# Patient Record
Sex: Female | Born: 1945 | Race: Black or African American | Hispanic: No | Marital: Married | Smoking: Former smoker
Health system: Southern US, Community
[De-identification: ages and names within clinical notes are randomized; demographics above are authoritative.]

## PROBLEM LIST (undated history)

## (undated) DIAGNOSIS — R51 Headache: Secondary | ICD-10-CM

## (undated) DIAGNOSIS — J42 Unspecified chronic bronchitis: Secondary | ICD-10-CM

## (undated) DIAGNOSIS — K219 Gastro-esophageal reflux disease without esophagitis: Secondary | ICD-10-CM

## (undated) DIAGNOSIS — J449 Chronic obstructive pulmonary disease, unspecified: Secondary | ICD-10-CM

## (undated) DIAGNOSIS — Z9981 Dependence on supplemental oxygen: Secondary | ICD-10-CM

## (undated) DIAGNOSIS — R0600 Dyspnea, unspecified: Secondary | ICD-10-CM

## (undated) DIAGNOSIS — C3490 Malignant neoplasm of unspecified part of unspecified bronchus or lung: Secondary | ICD-10-CM

## (undated) DIAGNOSIS — G4733 Obstructive sleep apnea (adult) (pediatric): Secondary | ICD-10-CM

## (undated) DIAGNOSIS — F419 Anxiety disorder, unspecified: Secondary | ICD-10-CM

## (undated) DIAGNOSIS — Z923 Personal history of irradiation: Secondary | ICD-10-CM

## (undated) DIAGNOSIS — M1990 Unspecified osteoarthritis, unspecified site: Secondary | ICD-10-CM

## (undated) DIAGNOSIS — F32A Depression, unspecified: Secondary | ICD-10-CM

## (undated) DIAGNOSIS — R519 Headache, unspecified: Secondary | ICD-10-CM

## (undated) DIAGNOSIS — Z9289 Personal history of other medical treatment: Secondary | ICD-10-CM

## (undated) DIAGNOSIS — I2510 Atherosclerotic heart disease of native coronary artery without angina pectoris: Secondary | ICD-10-CM

## (undated) DIAGNOSIS — J189 Pneumonia, unspecified organism: Secondary | ICD-10-CM

## (undated) DIAGNOSIS — I10 Essential (primary) hypertension: Secondary | ICD-10-CM

## (undated) DIAGNOSIS — F329 Major depressive disorder, single episode, unspecified: Secondary | ICD-10-CM

## (undated) DIAGNOSIS — Z9989 Dependence on other enabling machines and devices: Secondary | ICD-10-CM

## (undated) DIAGNOSIS — I509 Heart failure, unspecified: Secondary | ICD-10-CM

## (undated) DIAGNOSIS — I272 Pulmonary hypertension, unspecified: Secondary | ICD-10-CM

## (undated) DIAGNOSIS — C3492 Malignant neoplasm of unspecified part of left bronchus or lung: Secondary | ICD-10-CM

## (undated) HISTORY — PX: TUBAL LIGATION: SHX77

## (undated) HISTORY — PX: SINUSOTOMY: SHX291

## (undated) HISTORY — PX: DILATION AND CURETTAGE OF UTERUS: SHX78

## (undated) HISTORY — PX: LAPAROSCOPIC CHOLECYSTECTOMY: SUR755

## (undated) HISTORY — DX: Personal history of irradiation: Z92.3

## (undated) HISTORY — DX: Malignant neoplasm of unspecified part of left bronchus or lung: C34.92

## (undated) HISTORY — DX: Chronic obstructive pulmonary disease, unspecified: J44.9

## (undated) HISTORY — PX: TONSILLECTOMY: SUR1361

## (undated) HISTORY — PX: COLONOSCOPY: SHX174

---

## 1998-03-21 ENCOUNTER — Encounter: Payer: Self-pay | Admitting: Surgery

## 1998-03-25 ENCOUNTER — Ambulatory Visit (HOSPITAL_COMMUNITY): Admission: RE | Admit: 1998-03-25 | Discharge: 1998-03-26 | Payer: Self-pay | Admitting: Surgery

## 2002-03-29 ENCOUNTER — Encounter: Admission: RE | Admit: 2002-03-29 | Discharge: 2002-03-29 | Payer: Self-pay | Admitting: Cardiology

## 2002-03-29 ENCOUNTER — Encounter: Payer: Self-pay | Admitting: Cardiology

## 2003-01-22 ENCOUNTER — Encounter: Admission: RE | Admit: 2003-01-22 | Discharge: 2003-01-22 | Payer: Self-pay | Admitting: Cardiology

## 2005-03-17 ENCOUNTER — Encounter: Admission: RE | Admit: 2005-03-17 | Discharge: 2005-03-17 | Payer: Self-pay | Admitting: Cardiology

## 2005-03-27 ENCOUNTER — Emergency Department (HOSPITAL_COMMUNITY): Admission: EM | Admit: 2005-03-27 | Discharge: 2005-03-28 | Payer: Self-pay | Admitting: Emergency Medicine

## 2009-06-04 ENCOUNTER — Ambulatory Visit: Payer: Self-pay | Admitting: Internal Medicine

## 2009-06-04 DIAGNOSIS — J328 Other chronic sinusitis: Secondary | ICD-10-CM

## 2009-06-04 LAB — CONVERTED CEMR LAB
Basophils Absolute: 0 10*3/uL (ref 0.0–0.1)
Basophils Relative: 0.5 % (ref 0.0–3.0)
Eosinophils Absolute: 0.1 10*3/uL (ref 0.0–0.7)
Eosinophils Relative: 1.6 % (ref 0.0–5.0)
HCT: 43.9 % (ref 36.0–46.0)
Hemoglobin: 14.8 g/dL (ref 12.0–15.0)
Lymphocytes Relative: 32.4 % (ref 12.0–46.0)
Lymphs Abs: 2 10*3/uL (ref 0.7–4.0)
MCHC: 33.8 g/dL (ref 30.0–36.0)
MCV: 94.1 fL (ref 78.0–100.0)
Monocytes Absolute: 0.4 10*3/uL (ref 0.1–1.0)
Monocytes Relative: 6.1 % (ref 3.0–12.0)
Neutro Abs: 3.6 10*3/uL (ref 1.4–7.7)
Neutrophils Relative %: 59.4 % (ref 43.0–77.0)
Platelets: 183 10*3/uL (ref 150.0–400.0)
RBC: 4.67 M/uL (ref 3.87–5.11)
RDW: 15 % — ABNORMAL HIGH (ref 11.5–14.6)
WBC: 6.1 10*3/uL (ref 4.5–10.5)

## 2009-06-07 DIAGNOSIS — F17200 Nicotine dependence, unspecified, uncomplicated: Secondary | ICD-10-CM | POA: Insufficient documentation

## 2009-06-07 DIAGNOSIS — I10 Essential (primary) hypertension: Secondary | ICD-10-CM | POA: Insufficient documentation

## 2009-06-09 ENCOUNTER — Telehealth (INDEPENDENT_AMBULATORY_CARE_PROVIDER_SITE_OTHER): Payer: Self-pay | Admitting: *Deleted

## 2009-06-12 ENCOUNTER — Encounter: Payer: Self-pay | Admitting: Internal Medicine

## 2009-07-30 ENCOUNTER — Ambulatory Visit: Payer: Self-pay | Admitting: Internal Medicine

## 2009-07-30 DIAGNOSIS — J42 Unspecified chronic bronchitis: Secondary | ICD-10-CM

## 2010-02-24 NOTE — Assessment & Plan Note (Signed)
Summary: allergy skin testing ///kp   Vital Signs:  Patient profile:   65 year old female Weight:      154 pounds O2 Sat:      90 % on Room air Pulse rate:   93 / minute BP sitting:   160 / 72  (left arm) Cuff size:   regular  Vitals Entered By: Reynaldo Minium CMA (July 30, 2009 3:28 PM)  O2 Flow:  Room air CC: Allergy testing   Primary Provider/Referring Provider:  Donia Guiles  CC:  Allergy testing.  History of Present Illness: History of Present Illness: Jun 04, 2009- 63 yoF smoker referred courtesy of Dr Shana Chute, complaining of chronic sinus disease.  since around age 5 hse has noted frontla pressure headacxhes, wateriung eyes, nasal congestion. 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 Has had pattern of sinus infections 3 x/ year with many antibiotics taken. Uses saline squeeze bottle.  In [place] this spring her eyes burned, ears and nose itched.  Had CT sinus by ENT several years ago. Has had tonsils out. Two years ago sinus surgery helped for about 6 months only. Last antibiotic was 5 months ago. doesn't remember treatment with steroids. Admits cough and wheeze only with colds. Strong perfume and irritant odors cause some nasal congestion.  July 30, 2009- Chronic rhinosinusitis Off antihistamines for skin testing, she reports that for past few days ears itch, eyes water, nasal/ head congestion. Not chest tight or wheeze currently.  Discussed her smoking again with encouragement to quit. She understands it can't be helping her airway complaints. Veramyst didn't help enough. IgE 22.2  CBC- OK; EOS 1.6% Allergy profile- neg for specific IgE elevations Skin test- Pos grass, weed, tree, dust   Preventive Screening-Counseling & Management  Alcohol-Tobacco     Smoking Status: current     Smoking Cessation Counseling: yes     Packs/Day: 1.0     Tobacco Counseling: to quit use of tobacco  products  Current Medications (verified): 1)  Amlodipine Besy-Benazepril Hcl 5-20 Mg Caps (Amlodipine Besy-Benazepril Hcl) .... Take 1 By Mouth Once Daily 2)  Hydrochlorothiazide 12.5 Mg Caps (Hydrochlorothiazide) .... Take 1 By Mouth Once Daily 3)  Allegra 180 Mg Tabs (Fexofenadine Hcl) .... Take 1 By Mouth Once Daily 4)  Mucinex 600 Mg Xr12h-Tab (Guaifenesin) .... Take As Directed As Needed On Box 5)  Sinus Rinse Kit  Pack (Hypertonic Nasal Wash) .... As Needed 6)  Opcon-A 0.027-0.315 % Soln (Naphazoline-Pheniramine) .... Three Times A Day 7)  Singulair 10 Mg Tabs (Montelukast Sodium) .Marland Kitchen.. 1 Daily 8)  Aleve 220 Mg Tabs (Naproxen Sodium) .... As Needed Per Bottle  Allergies (verified): No Known Drug Allergies  Past History:  Past Surgical History: Last updated: 06/04/2009 Tonsillectomy Sinus surgery  Family History: Last updated: 06/04/2009 Family Hx of Allergies/ sinus disease, and Heart Disease.  Social History: Last updated: 06/04/2009 married with children Smoker-1ppd Retired Engineer, site  Risk Factors: Smoking Status: current (07/30/2009) Packs/Day: 1.0 (07/30/2009)  Past Medical History: Chronic rhinosiusitis- Skin test 7/ 6/11 Tobacco use Hypertension Hx CHF  Social History: Smoking Status:  current Packs/Day:  1.0  Review of Systems      See HPI       The patient complains of non-productive cough, nasal congestion/difficulty breathing through nose, and sneezing.  The patient denies shortness of breath with activity, shortness of breath at rest, productive cough, coughing up blood, chest  pain, irregular heartbeats, acid heartburn, indigestion, loss of appetite, weight change, abdominal pain, difficulty swallowing, sore throat, tooth/dental problems, and headaches.    Physical Exam  Additional Exam:  General: A/Ox3; pleasant and cooperative, NAD, intelligent, medium build SKIN: no rash, lesions NODES: no lymphadenopathy HEENT: [place]/AT, EOM- WNL,  Conjuctivae- clear, PERRLA, TM-WNL, Nose- clear, pale turbinates, Throat- clear and wnl, Mallampati  III NECK: Supple w/ fair ROM, JVD- none, normal carotid impulses w/o bruits Thyroid- normal to palpation CHEST: deep, unlabored cough without rhonchi or wheeze HEART: RRR, no m/g/r heard ABDOMEN: Soft and nl;  GLO:VFIE, nl pulses, no edema  NEURO: Grossly intact to observation      Impression & Recommendations:  Problem # 1:  RHINOSINUSITIS, CHRONIC (ICD-473.8) There are both allergic rhinitis and irritant components. we discussed environmnetal precautions and the role of smoking cessation. Potential treatments include nasal steroids, antihistamine/ decongestants, saline lavage. Allergy vaccine would be a later thought if necessary.  Problem # 2:  TOBACCO USER (ICD-305.1) We emphasized the importance of smokng cessation and available support measures.  Problem # 3:  BRONCHITIS, CHRONIC (ICD-491.9)  Tobacco pattern cough. This will likely need more attention. She agrees to CXR.  Medications Added to Medication List This Visit: 1)  Aleve 220 Mg Tabs (Naproxen sodium) .... As needed per bottle  Other Orders: Est. Patient Level II (33295) T-2 View CXR (71020TC) Est. Patient Level III (18841) Allergy Puncture Test (66063) Allergy I.D Test (01601)  Patient Instructions: 1)  Please schedule a follow-up appointment in 2 months. 2)  Consider Sudafed-PE as a decongestant. 3)  Try sample Astepro nasal antihitamine nose spray. 4)  1-2 puffs each nostril up to twice daily if needed. 5)  A chest x-ray has been recommended.  Your imaging study may require preauthorization.  6)  Please try to stop smoking before it stops you.  7)  Consider the dust control measures we discussed

## 2010-02-24 NOTE — Miscellaneous (Signed)
Summary: Skin Test/[facility]  Skin Test/ Allergy   Imported By: Sherian Rein 08/08/2009 15:12:48  _____________________________________________________________________  External Attachment:    Type:   Image     Comment:   External Document

## 2010-02-24 NOTE — Progress Notes (Signed)
Summary: Prior Auth-Singulair  Phone Note From Pharmacy   Caller: [employer] Call For: [place]  Summary of Call: Spoke with [employer] regarding prior auth for Sinulair 10mg .  Case # [id number].  Form being faxed .  Will place  in Dr Roxy Cedar look at. Initial call taken by: Abigail Miyamoto RN,  Jun 09, 2009 11:24 AM  Follow-up for Phone Call        Forms have been filled out and faxed to insurance company; in waiting pile in Triage room.Reynaldo Minium CMA  Jun 10, 2009 10:53 AM   Spoke with pt; aware that RX is approved.Reynaldo Minium CMA  Jun 11, 2009 1:41 PM

## 2010-02-24 NOTE — Medication Information (Signed)
Summary: Tax adviser   Imported By: Valinda Hoar 06/12/2009 16:06:26  _____________________________________________________________________  External Attachment:    Type:   Image     Comment:   External Document

## 2010-02-24 NOTE — Assessment & Plan Note (Signed)
Summary: per dr spruill/allergies/mhh   Primary Provider/Referring Provider:  Donia Guiles  CC:  Consult-Dr. Spruill: Increased Sinus problems..  History of Present Illness: Jun 04, 2009- 65 yoF smoker referred courtesy of Dr Shana Chute, complaining of chronic sinus disease.  since around age 65 hse has noted frontla pressure headacxhes, wateriung eyes, nasal congestion. 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 Has had pattern of sinus infections 3 x/ year with many antibiotics taken. Uses saline squeeze bottle.  In [place] this spring her eyes burned, ears and nose itched.  Had CT sinus by ENT several years ago. Has had tonsils out. Two years ago sinus surgery helped for about 6 months only. Last antibiotic was 5 months ago. doesn't remember treatment with steroids. Admits cough and wheeze only with colds. Strong perfume and irritant odors cause some nasal congestion.   Current Medications (verified): 1)  Amlodipine Besy-Benazepril Hcl 5-20 Mg Caps (Amlodipine Besy-Benazepril Hcl) .... Take 1 By Mouth Once Daily 2)  Hydrochlorothiazide 12.5 Mg Caps (Hydrochlorothiazide) .... Take 1 By Mouth Once Daily 3)  Allegra 180 Mg Tabs (Fexofenadine Hcl) .... Take 1 By Mouth Once Daily 4)  Mucinex 600 Mg Xr12h-Tab (Guaifenesin) .... Take As Directed As Needed On Box 5)  Sinus Rinse Kit  Pack (Hypertonic Nasal Wash) .... As Needed 6)  Opcon-A 0.027-0.315 % Soln (Naphazoline-Pheniramine) .... Three Times A Day  Allergies (verified): No Known Drug Allergies  Past History:  Family History: Last updated: 06/04/2009 Family Hx of Allergies/ sinus disease, and Heart Disease.  Social History: Last updated: 06/04/2009 married with children Smoker-1ppd Retired Engineer, site  Past Medical History: Chronic rhinosiusitis Tobacco use Hypertension Hx CHF  Past Surgical History: Tonsillectomy Sinus surgery  Family  History: Family Hx of Allergies/ sinus disease, and Heart Disease.  Social History: married with children Smoker-1ppd Retired Engineer, site  Review of Systems       The patient complains of shortness of breath with activity, productive cough, non-productive cough, headaches, nasal congestion/difficulty breathing through nose, sneezing, itching, rash, and change in color of mucus.  The patient denies shortness of breath at rest, coughing up blood, chest pain, irregular heartbeats, acid heartburn, indigestion, loss of appetite, weight change, abdominal pain, difficulty swallowing, sore throat, tooth/dental problems, ear ache, anxiety, depression, hand/feet swelling, joint stiffness or pain, and fever.    Vital Signs:  Patient profile:   65 year old female Weight:      153 pounds O2 Sat:      92 % on Room air Pulse rate:   96 / minute BP sitting:   142 / 80  (left arm) Cuff size:   regular  Vitals Entered By: Reynaldo Minium CMA (Jun 04, 2009 9:10 AM)  O2 Flow:  Room air  Physical Exam  Additional Exam:  General: A/Ox3; pleasant and cooperative, NAD, intelligent, medium build SKIN: no rash, lesions NODES: no lymphadenopathy HEENT: [place]/AT, EOM- WNL, Conjuctivae- clear, PERRLA, TM-WNL, Nose- clear, pale turbinates, Throat- clear and wnl, Mallampati  III NECK: Supple w/ fair ROM, JVD- none, normal carotid impulses w/o bruits Thyroid- normal to palpation CHEST: Clear to P&A HEART: RRR, no m/g/r heard ABDOMEN: Soft and nl; nml bowel sounds; no organomegaly or masses noted ZOX:WRUE, nl pulses, no edema  NEURO: Grossly intact to observation      Impression & Recommendations:  Problem # 1:  RHINOSINUSITIS, CHRONIC (ICD-473.8) Hx consistent with nonspecific postinflammatory disease. The question for me is  whether there is a significant allergic component. I explained that tobacco smoking is likely to make it worse and she should stop. We will give pneumovax, draw labs for IgE  assessment and bring her back for skin testing. Meanwhile we will let her try Veramyst with singulair and sudafed-PE.  Problem # 2:  TOBACCO USER (ICD-305.1) I have emphasized need to stop and discussed some methds. It is unclear if she has significant lung diease.  Medications Added to Medication List This Visit: 1)  Amlodipine Besy-benazepril Hcl 5-20 Mg Caps (Amlodipine besy-benazepril hcl) .... Take 1 by mouth once daily 2)  Hydrochlorothiazide 12.5 Mg Caps (Hydrochlorothiazide) .... Take 1 by mouth once daily 3)  Allegra 180 Mg Tabs (Fexofenadine hcl) .... Take 1 by mouth once daily 4)  Mucinex 600 Mg Xr12h-tab (Guaifenesin) .... Take as directed as needed on box 5)  Sinus Rinse Kit Pack (Hypertonic nasal wash) .... As needed 6)  Opcon-a 0.027-0.315 % Soln (Naphazoline-pheniramine) .... Three times a day 7)  Singulair 10 Mg Tabs (Montelukast sodium) .Marland Kitchen.. 1 daily  Other Orders: Consultation Level IV (16109) TLB-CBC Platelet - w/Differential (85025-CBCD) T-Allergy Profile Region [place], [place], [place], [place], [place] [id number]) Pneumococcal Vaccine (40981) Admin 1st Vaccine (19147)  Patient Instructions: 1)  Return as able for allergy skin testing. Stop all antihistamines 3 days before skin testing, including cold and allergy meds, otc sleep and cough meds. This includes Allegra. 2)  Try otc decongestant Sudafed-PE once each morning 3)  Try sample Veramyst nasal steroid spray: 2 puffs each nostril once every day at bedtime. Use up the sample. 4)  lab 5)  Script for Singulair 6)  Pneumovax Prescriptions: SINGULAIR 10 MG TABS (MONTELUKAST SODIUM) 1 daily  #30 x prn   Entered and Authorized by:   Waymon Budge MD   Signed by:   Waymon Budge MD on 06/04/2009   Method used:   Print then Give to Patient   RxID:   [id number]    Immunizations Administered:  Pneumonia Vaccine:    Vaccine Type: Pneumovax    Site: left deltoid    Mfr: Merck    Dose: 0.5 ml    Route: IM    Given by:  Elray Buba RN    Exp. Date: 09/06/2010    Lot #: [id number]    VIS given: 08/23/95 version given Jun 04, 2009.

## 2011-03-03 ENCOUNTER — Other Ambulatory Visit: Payer: Self-pay | Admitting: Gastroenterology

## 2012-02-07 ENCOUNTER — Other Ambulatory Visit: Payer: Self-pay | Admitting: Cardiology

## 2012-02-07 ENCOUNTER — Ambulatory Visit
Admission: RE | Admit: 2012-02-07 | Discharge: 2012-02-07 | Disposition: A | Discharge status: Home / Self Care | Payer: Medicare Other | Source: Ambulatory Visit | Attending: Cardiology | Admitting: Cardiology

## 2012-02-07 DIAGNOSIS — I429 Cardiomyopathy, unspecified: Secondary | ICD-10-CM

## 2012-02-07 DIAGNOSIS — R0600 Dyspnea, unspecified: Secondary | ICD-10-CM

## 2013-03-06 ENCOUNTER — Other Ambulatory Visit: Payer: Self-pay | Admitting: Otolaryngology

## 2013-03-06 DIAGNOSIS — J329 Chronic sinusitis, unspecified: Secondary | ICD-10-CM

## 2013-03-09 ENCOUNTER — Other Ambulatory Visit: Payer: Self-pay | Admitting: Otolaryngology

## 2013-03-09 ENCOUNTER — Inpatient Hospital Stay: Admission: RE | Admit: 2013-03-09 | Payer: Medicare Other | Source: Ambulatory Visit

## 2013-03-09 ENCOUNTER — Ambulatory Visit
Admission: RE | Admit: 2013-03-09 | Discharge: 2013-03-09 | Disposition: A | Discharge status: Home / Self Care | Payer: Medicare Other | Source: Ambulatory Visit | Attending: Otolaryngology | Admitting: Otolaryngology

## 2013-03-09 DIAGNOSIS — J329 Chronic sinusitis, unspecified: Secondary | ICD-10-CM

## 2013-11-26 ENCOUNTER — Other Ambulatory Visit: Payer: Self-pay | Admitting: Cardiology

## 2013-11-26 DIAGNOSIS — Z1231 Encounter for screening mammogram for malignant neoplasm of breast: Secondary | ICD-10-CM

## 2013-12-18 ENCOUNTER — Ambulatory Visit: Payer: Medicare Other

## 2013-12-25 ENCOUNTER — Ambulatory Visit
Admission: RE | Admit: 2013-12-25 | Discharge: 2013-12-25 | Disposition: A | Discharge status: Home / Self Care | Payer: Medicare Other | Source: Ambulatory Visit | Attending: Cardiology | Admitting: Cardiology

## 2013-12-25 DIAGNOSIS — Z1231 Encounter for screening mammogram for malignant neoplasm of breast: Secondary | ICD-10-CM

## 2014-06-12 ENCOUNTER — Other Ambulatory Visit: Payer: Self-pay | Admitting: Cardiology

## 2014-06-12 ENCOUNTER — Other Ambulatory Visit: Payer: Self-pay

## 2014-06-12 ENCOUNTER — Ambulatory Visit (HOSPITAL_COMMUNITY): Payer: Medicare Other | Attending: Cardiology

## 2014-06-12 DIAGNOSIS — I429 Cardiomyopathy, unspecified: Secondary | ICD-10-CM | POA: Diagnosis not present

## 2014-06-12 DIAGNOSIS — I10 Essential (primary) hypertension: Secondary | ICD-10-CM | POA: Diagnosis not present

## 2014-06-12 DIAGNOSIS — I428 Other cardiomyopathies: Secondary | ICD-10-CM

## 2014-09-02 ENCOUNTER — Encounter (HOSPITAL_COMMUNITY): Payer: Self-pay | Admitting: *Deleted

## 2014-09-02 ENCOUNTER — Emergency Department (HOSPITAL_COMMUNITY): Payer: Medicare Other

## 2014-09-02 ENCOUNTER — Inpatient Hospital Stay (HOSPITAL_COMMUNITY)
Admission: EM | Admit: 2014-09-02 | Discharge: 2014-09-04 | DRG: 190 | Disposition: A | Discharge status: Home / Self Care | Payer: Medicare Other | Attending: Internal Medicine | Admitting: Internal Medicine

## 2014-09-02 DIAGNOSIS — Z79899 Other long term (current) drug therapy: Secondary | ICD-10-CM | POA: Diagnosis not present

## 2014-09-02 DIAGNOSIS — I10 Essential (primary) hypertension: Secondary | ICD-10-CM | POA: Diagnosis present

## 2014-09-02 DIAGNOSIS — J441 Chronic obstructive pulmonary disease with (acute) exacerbation: Secondary | ICD-10-CM | POA: Diagnosis present

## 2014-09-02 DIAGNOSIS — R739 Hyperglycemia, unspecified: Secondary | ICD-10-CM | POA: Diagnosis present

## 2014-09-02 DIAGNOSIS — T380X5A Adverse effect of glucocorticoids and synthetic analogues, initial encounter: Secondary | ICD-10-CM | POA: Diagnosis present

## 2014-09-02 DIAGNOSIS — E875 Hyperkalemia: Secondary | ICD-10-CM | POA: Diagnosis present

## 2014-09-02 DIAGNOSIS — J9601 Acute respiratory failure with hypoxia: Secondary | ICD-10-CM | POA: Diagnosis present

## 2014-09-02 DIAGNOSIS — J328 Other chronic sinusitis: Secondary | ICD-10-CM | POA: Diagnosis present

## 2014-09-02 DIAGNOSIS — Z720 Tobacco use: Secondary | ICD-10-CM | POA: Diagnosis not present

## 2014-09-02 DIAGNOSIS — J440 Chronic obstructive pulmonary disease with acute lower respiratory infection: Principal | ICD-10-CM | POA: Diagnosis present

## 2014-09-02 DIAGNOSIS — J209 Acute bronchitis, unspecified: Secondary | ICD-10-CM | POA: Diagnosis present

## 2014-09-02 DIAGNOSIS — J42 Unspecified chronic bronchitis: Secondary | ICD-10-CM | POA: Diagnosis present

## 2014-09-02 DIAGNOSIS — E860 Dehydration: Secondary | ICD-10-CM | POA: Diagnosis present

## 2014-09-02 DIAGNOSIS — F17200 Nicotine dependence, unspecified, uncomplicated: Secondary | ICD-10-CM | POA: Diagnosis present

## 2014-09-02 DIAGNOSIS — F17210 Nicotine dependence, cigarettes, uncomplicated: Secondary | ICD-10-CM | POA: Diagnosis present

## 2014-09-02 DIAGNOSIS — J329 Chronic sinusitis, unspecified: Secondary | ICD-10-CM | POA: Diagnosis present

## 2014-09-02 HISTORY — DX: Unspecified chronic bronchitis: J42

## 2014-09-02 HISTORY — DX: Essential (primary) hypertension: I10

## 2014-09-02 LAB — URINALYSIS, ROUTINE W REFLEX MICROSCOPIC
Bilirubin Urine: NEGATIVE
GLUCOSE, UA: NEGATIVE mg/dL
Hgb urine dipstick: NEGATIVE
Ketones, ur: NEGATIVE mg/dL
LEUKOCYTES UA: NEGATIVE
NITRITE: NEGATIVE
Protein, ur: NEGATIVE mg/dL
Specific Gravity, Urine: 1.019 (ref 1.005–1.030)
Urobilinogen, UA: 0.2 mg/dL (ref 0.0–1.0)
pH: 5 (ref 5.0–8.0)

## 2014-09-02 LAB — BASIC METABOLIC PANEL
ANION GAP: 6 (ref 5–15)
BUN: 26 mg/dL — AB (ref 6–20)
CO2: 23 mmol/L (ref 22–32)
Calcium: 9.3 mg/dL (ref 8.9–10.3)
Chloride: 111 mmol/L (ref 101–111)
Creatinine, Ser: 1.11 mg/dL — ABNORMAL HIGH (ref 0.44–1.00)
GFR calc Af Amer: 58 mL/min — ABNORMAL LOW (ref >60)
GFR calc non Af Amer: 50 mL/min — ABNORMAL LOW (ref >60)
GLUCOSE: 112 mg/dL — AB (ref 65–99)
Potassium: 4.3 mmol/L (ref 3.5–5.1)
Sodium: 140 mmol/L (ref 135–145)

## 2014-09-02 LAB — CBC WITH DIFFERENTIAL/PLATELET
BASOS PCT: 0 % (ref 0–1)
Basophils Absolute: 0 10*3/uL (ref 0.0–0.1)
Eosinophils Absolute: 0.1 10*3/uL (ref 0.0–0.7)
Eosinophils Relative: 1 % (ref 0–5)
HCT: 43.3 % (ref 36.0–46.0)
HEMOGLOBIN: 14 g/dL (ref 12.0–15.0)
Lymphocytes Relative: 26 % (ref 12–46)
Lymphs Abs: 1.8 10*3/uL (ref 0.7–4.0)
MCH: 31.2 pg (ref 26.0–34.0)
MCHC: 32.3 g/dL (ref 30.0–36.0)
MCV: 96.4 fL (ref 78.0–100.0)
Monocytes Absolute: 0.3 10*3/uL (ref 0.1–1.0)
Monocytes Relative: 4 % (ref 3–12)
Neutro Abs: 4.8 10*3/uL (ref 1.7–7.7)
Neutrophils Relative %: 69 % (ref 43–77)
Platelets: 146 10*3/uL — ABNORMAL LOW (ref 150–400)
RBC: 4.49 MIL/uL (ref 3.87–5.11)
RDW: 13.8 % (ref 11.5–15.5)
WBC: 6.9 10*3/uL (ref 4.0–10.5)

## 2014-09-02 LAB — I-STAT TROPONIN, ED: TROPONIN I, POC: 0 ng/mL (ref 0.00–0.08)

## 2014-09-02 LAB — GLUCOSE, CAPILLARY
Glucose-Capillary: 137 mg/dL — ABNORMAL HIGH (ref 65–99)
Glucose-Capillary: 126 mg/dL — ABNORMAL HIGH (ref 65–99)

## 2014-09-02 LAB — BRAIN NATRIURETIC PEPTIDE: B NATRIURETIC PEPTIDE 5: 14 pg/mL (ref 0.0–100.0)

## 2014-09-02 MED ORDER — ACETAMINOPHEN 325 MG PO TABS: 650.0000 mg | ORAL_TABLET | Freq: Four times a day (QID) | ORAL | Status: DC | PRN

## 2014-09-02 MED ORDER — INSULIN ASPART 100 UNIT/ML ~~LOC~~ SOLN
0.0000 [IU] | Freq: Three times a day (TID) | SUBCUTANEOUS | Status: DC
  Administered 2014-09-02 – 2014-09-04 (×4): 1 [IU] via SUBCUTANEOUS

## 2014-09-02 MED ORDER — IPRATROPIUM BROMIDE 0.02 % IN SOLN
0.5000 mg | Freq: Once | RESPIRATORY_TRACT | Status: AC
  Administered 2014-09-02: 0.5 mg via RESPIRATORY_TRACT
  Filled 2014-09-02: qty 2.5

## 2014-09-02 MED ORDER — AMLODIPINE BESYLATE 5 MG PO TABS
5.0000 mg | ORAL_TABLET | Freq: Every day | ORAL | Status: DC
  Administered 2014-09-03: 5 mg via ORAL
  Filled 2014-09-02: qty 1

## 2014-09-02 MED ORDER — METHYLPREDNISOLONE SODIUM SUCC 125 MG IJ SOLR
125.0000 mg | Freq: Once | INTRAMUSCULAR | Status: AC
  Administered 2014-09-02: 125 mg via INTRAVENOUS
  Filled 2014-09-02: qty 2

## 2014-09-02 MED ORDER — ACETAMINOPHEN 650 MG RE SUPP: 650.0000 mg | Freq: Four times a day (QID) | RECTAL | Status: DC | PRN

## 2014-09-02 MED ORDER — ONDANSETRON HCL 4 MG/2ML IJ SOLN: 4.0000 mg | Freq: Four times a day (QID) | INTRAMUSCULAR | Status: DC | PRN

## 2014-09-02 MED ORDER — HEPARIN SODIUM (PORCINE) 5000 UNIT/ML IJ SOLN
5000.0000 [IU] | Freq: Three times a day (TID) | INTRAMUSCULAR | Status: DC
  Administered 2014-09-02 – 2014-09-04 (×6): 5000 [IU] via SUBCUTANEOUS
  Filled 2014-09-02 (×6): qty 1

## 2014-09-02 MED ORDER — ALBUTEROL SULFATE (2.5 MG/3ML) 0.083% IN NEBU: 2.5000 mg | INHALATION_SOLUTION | RESPIRATORY_TRACT | Status: DC

## 2014-09-02 MED ORDER — ALBUTEROL SULFATE (2.5 MG/3ML) 0.083% IN NEBU: 5.0000 mg | INHALATION_SOLUTION | Freq: Once | RESPIRATORY_TRACT | Status: DC

## 2014-09-02 MED ORDER — PANTOPRAZOLE SODIUM 40 MG PO TBEC
40.0000 mg | DELAYED_RELEASE_TABLET | Freq: Every day | ORAL | Status: DC
  Administered 2014-09-02 – 2014-09-04 (×3): 40 mg via ORAL
  Filled 2014-09-02 (×3): qty 1

## 2014-09-02 MED ORDER — ALBUTEROL SULFATE (2.5 MG/3ML) 0.083% IN NEBU
5.0000 mg | INHALATION_SOLUTION | Freq: Once | RESPIRATORY_TRACT | Status: AC
  Administered 2014-09-02: 5 mg via RESPIRATORY_TRACT
  Filled 2014-09-02: qty 6

## 2014-09-02 MED ORDER — ONDANSETRON HCL 4 MG PO TABS: 4.0000 mg | ORAL_TABLET | Freq: Four times a day (QID) | ORAL | Status: DC | PRN

## 2014-09-02 MED ORDER — IPRATROPIUM-ALBUTEROL 0.5-2.5 (3) MG/3ML IN SOLN: 3.0000 mL | RESPIRATORY_TRACT | Status: DC | PRN

## 2014-09-02 MED ORDER — LEVOFLOXACIN IN D5W 500 MG/100ML IV SOLN
500.0000 mg | INTRAVENOUS | Status: DC
  Administered 2014-09-02: 500 mg via INTRAVENOUS
  Filled 2014-09-02: qty 100

## 2014-09-02 MED ORDER — BUDESONIDE 0.5 MG/2ML IN SUSP
0.5000 mg | Freq: Two times a day (BID) | RESPIRATORY_TRACT | Status: DC
  Administered 2014-09-02 – 2014-09-04 (×4): 0.5 mg via RESPIRATORY_TRACT
  Filled 2014-09-02 (×4): qty 2

## 2014-09-02 MED ORDER — SODIUM CHLORIDE 0.9 % IV SOLN
INTRAVENOUS | Status: DC
  Administered 2014-09-02 – 2014-09-03 (×2): via INTRAVENOUS

## 2014-09-02 MED ORDER — METHYLPREDNISOLONE SODIUM SUCC 125 MG IJ SOLR
60.0000 mg | Freq: Four times a day (QID) | INTRAMUSCULAR | Status: DC
  Administered 2014-09-02 – 2014-09-03 (×4): 60 mg via INTRAVENOUS
  Filled 2014-09-02 (×4): qty 2

## 2014-09-02 MED ORDER — NICOTINE 21 MG/24HR TD PT24
21.0000 mg | MEDICATED_PATCH | Freq: Every day | TRANSDERMAL | Status: DC
  Administered 2014-09-02 – 2014-09-04 (×3): 21 mg via TRANSDERMAL
  Filled 2014-09-02 (×3): qty 1

## 2014-09-02 MED ORDER — SERTRALINE HCL 50 MG PO TABS
50.0000 mg | ORAL_TABLET | Freq: Every day | ORAL | Status: DC
  Administered 2014-09-02 – 2014-09-03 (×2): 50 mg via ORAL
  Filled 2014-09-02 (×2): qty 1

## 2014-09-02 MED ORDER — GUAIFENESIN ER 600 MG PO TB12
1200.0000 mg | ORAL_TABLET | Freq: Two times a day (BID) | ORAL | Status: DC
  Administered 2014-09-02 – 2014-09-04 (×5): 1200 mg via ORAL
  Filled 2014-09-02 (×5): qty 2

## 2014-09-02 MED ORDER — IPRATROPIUM-ALBUTEROL 0.5-2.5 (3) MG/3ML IN SOLN
3.0000 mL | Freq: Four times a day (QID) | RESPIRATORY_TRACT | Status: DC
  Administered 2014-09-02 – 2014-09-03 (×4): 3 mL via RESPIRATORY_TRACT
  Filled 2014-09-02 (×6): qty 3

## 2014-09-02 MED ORDER — IPRATROPIUM BROMIDE 0.02 % IN SOLN
0.5000 mg | Freq: Once | RESPIRATORY_TRACT | Status: DC
Start: 2014-09-02 — End: 2014-09-02

## 2014-09-02 NOTE — Progress Notes (Signed)
Pt admitted to 6N13 via stretcher from ED.  Pt AAOX4.  Pt on 2L O2 via [place].  Pt has 22G to Lt FA SL.  Family and belongings to bedside.  Report rcvd from [place], Therapist, sports.  Pt has no questions at the moment.  Will continue to monitor.

## 2014-09-02 NOTE — H&P (Signed)
[facility] History and Physical  Suzanne Dickson TUU:[id number] DOB: 07/31/1945 DOA: 09/02/2014  Referring physician: EDP PCP: No primary care provider on file.  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 female with HTN, bronchitis, chronic sinusitis and 40-pack-year smoking history presented to the ED for increased shortness of breath with productive cough x 3 weeks. The cough has been persistent for 2-3 months, but worsened the past 3 weeks and is productive with thick yellow/white phlegm, no hemoptysis. She has had increased shortness of breath and was prescribed an albuterol nebulizer on Friday, which she used 4 times on Saturday, 4 times on Sunday and 2 times today. The shortness of breath is aggravated by paroxysmal coughing and exertion. The albuterol provides mild, temporary relief. She has had no sick contacts and does not use oxygen at home. Her PCP prescribed an antibiotic, unknown, which she finished 08/27/14.   While in the ED, she received 2 duonebs that provided mild relief, but once she started coughing or walking, the shortness of breath returned. Her O2 stats have been 84-96% on 2L O2. She will be admitted to inpatient.   Review of Systems:  Constitutional: +night sweats, No weight loss, Fevers, chills, fatigue.  HEENT: +nasal congestion, No headaches, Difficulty swallowing, Sore throat, No sneezing, itching, ear ache, post nasal drip,  Cardio-vascular: No chest pain, Orthopnea, PND, swelling in lower extremities, anasarca, dizziness, palpitations  GI: No heartburn, indigestion, abdominal pain, nausea, vomiting, diarrhea, change in bowel habits, loss of appetite  Resp: +Shortness of breath with exertion and coughing, productive cough, wheezing. No excess mucus, No coughing up of blood.No change in color of mucus.No chest wall deformity  Skin: no rash or lesions. Musculoskeletal: No joint pain or swelling. No decreased  range of motion. No back pain.  Psych: No change in mood or affect. No depression or anxiety. No memory loss.   Past medical history includes chronic cough, chronic sinusitis and hypertension. Denies diagnosis of bronchitis or COPD.  Social History: Patient has 40 pack year smoking history, no etoh, no illicit drug use. She lives at home with her husband and son. She is able to ambulate on her own and does not require a walker.   No Known Allergies  Family history includes grandson who has asthma.  Prior to Admission medications   Medication Sig Start Date End Date Taking? Authorizing Provider  albuterol (PROVENTIL) (2.5 MG/3ML) 0.083% nebulizer solution Take 2.5 mg by nebulization every 6 (six) hours as needed for wheezing or shortness of breath.   Yes Historical Provider, MD  amLODipine-benazepril (LOTREL) 5-20 MG per capsule Take 1 capsule by mouth daily. 08/05/14  Yes Historical Provider, MD  fexofenadine-pseudoephedrine (ALLEGRA-D) 60-120 MG per tablet Take 1 tablet by mouth 2 (two) times daily.   Yes Historical Provider, MD  hydrochlorothiazide (MICROZIDE) 12.5 MG capsule Take 12.5 mg by mouth daily. 07/24/14  Yes Historical Provider, MD  sertraline (ZOLOFT) 50 MG tablet Take 50 mg by mouth at bedtime.  08/27/14  Yes Historical Provider, MD   Physical Exam: Filed Vitals:   09/02/14 1215 09/02/14 1230 09/02/14 1300 09/02/14 1315  BP: 106/57 126/62 100/43 114/52  Pulse: 91 96 95 95  Temp:      TempSrc:      Resp: '21 18 17 16  '$ Height:      Weight:      SpO2: 94% 95% 90% 91%    Wt Readings from Last 3 Encounters:  09/02/14 68.04 kg (150 lb)  07/30/09 69.854 kg (154 lb)  06/04/09 69.4 kg (153 lb)    General:  Patient is seated in bed in no acute distress with intermittent coughing and some tearing of the eyes. She appears anxious, is pleasant with clear and fluent speech.  Eyes: Normal lids, irises & conjunctiva ENT: grossly normal hearing, lips & tongue Neck: no LAD, masses or  thyromegaly Cardiovascular: RRR, no m/r/g. No LE edema. No JVD Respiratory: Diffuse expiratory wheezes with upper respiratory congestion. Deep inspiration causes significant productive coughing with difficulty breathing Abdomen: soft, ntnd Skin: no rash or induration seen on limited exam Musculoskeletal: grossly normal tone BUE/BLE. 5/5 strength BUE/BLE Psychiatric: grossly normal mood and affect, speech fluent and appropriate Neurologic: grossly non-focal.         Labs on Admission:  Basic Metabolic Panel:  Recent Labs Lab 09/02/14 1050  NA 140  K 4.3  CL 111  CO2 23  GLUCOSE 112*  BUN 26*  CREATININE 1.11*  CALCIUM 9.3   CBC:  Recent Labs Lab 09/02/14 1050  WBC 6.9  NEUTROABS 4.8  HGB 14.0  HCT 43.3  MCV 96.4  PLT 146*   Radiological Exams on Admission: Dg Chest 2 View  09/02/2014   CLINICAL DATA:  Shortness of breath, smoker  EXAM: CHEST  2 VIEW  COMPARISON:  02/07/2012  FINDINGS: Cardiomediastinal silhouette is stable. No acute infiltrate or pleural effusion. No pulmonary edema. Mild hyperinflation again noted. Atherosclerotic calcifications of thoracic aorta.  IMPRESSION: No active cardiopulmonary disease.  Mild hyperinflation.   Electronically Signed   By: Lahoma Crocker M.D.   On: 09/02/2014 11:22    EKG: Independently reviewed. Sinus rhythm.  Assessment/Plan Principal Problem:   Obstructive chronic bronchitis with exacerbation Active Problems:   TOBACCO USER   Essential hypertension   RHINOSINUSITIS, CHRONIC   Chronic bronchitis  Bronchitis with acute exacerbation - Chronic bronchitis with acute worsening of cough and shortness of breath. Mild relief with Duonebs.  - CXR negative for pneumonia, some hyperinflation - Echo on 06/12/14 LVEF 55-60% with grade 1 diastolic dysfunction - Continue IV solumedrol, duonebs PRN, Levaquin, mucinex, bipap if needed - SSI sensitive for steroids  - Check BNP, monitor respiratory status - consult respiratory if needed     Hypertension - Controlled, normally on amlodipine-benazepril and HCTZ but we'll hold these in light of dehydration. - Continue amlodipine  Mildly elevated BUN and creatinine - Likely due to decreased PO fluid intake, lytes WNL. IVF and monitor. - Hold HCTZ and benazepril. Please  restart when appropriate  Tobacco abuse - 40 pack year history, contributing to shortness of breath, cessation counseling - Nicotine patch  Code Status: Full DVT Prophylaxis: Heparin Family Communication: Husband at bedside Disposition Plan: Admit to inpatient  Time spent: [place], PA-S Imogene Burn, [place] [facility] Pager [phone]

## 2014-09-02 NOTE — ED Notes (Signed)
Pt arrives via POV. Pt states she woke up this AM with SOB. Pt gave herself 2 albuterol nebs this morning before coming to hospital. RA was 91%.

## 2014-09-02 NOTE — ED Notes (Signed)
Pts O2 88% on RA at rest after neb tx. Pt placed on 2 L via nasal cannula. Dr. Laneta Simmers made aware.

## 2014-09-02 NOTE — ED Provider Notes (Signed)
CSN: [id number]     Arrival date & time 09/02/14  3748 History   First MD Initiated Contact with Patient 09/02/14 1031     Chief Complaint  Patient presents with  . Shortness of Breath     (Consider location/radiation/quality/duration/timing/severity/associated sxs/prior Treatment) HPI   Patient is a 69 year old female, 40-pack-year history, current smoker, with pertinent medical history of hypertension, nonischemic cardiomyopathy, allergic rhinitis, who presents to the emergency room after 3 days of increasing shortness of breath following 3 weeks of productive cough.  She was reportedly seen Friday by her care provider and given albuterol nebulizer.  She woke up this morning with increasing shortness of breath, worse with exertion, and associated with decreased energy and sweats. She did 2 breathing treatments without any improvement and then reported to the ER.  Her cough has been productive with white to yellow sputum, denies hemoptysis.  She denies any orthopnea, PND, lower extremity swelling, palpitations, fever or chills.  She does not have any chest pain, and has not been lightheaded.    History reviewed. No pertinent past medical history. History reviewed. No pertinent past surgical history. History reviewed. No pertinent family history. History  Substance Use Topics  . Smoking status: Current Every Day Smoker -- 1.00 packs/day    Types: Cigarettes  . Smokeless tobacco: Never Used  . Alcohol Use: No   OB History    No data available     Review of Systems  Constitutional: Positive for diaphoresis, activity change and fatigue. Negative for fever, chills, appetite change and unexpected weight change.  HENT: Positive for congestion and rhinorrhea. Negative for dental problem, drooling, ear discharge, ear pain, facial swelling, hearing loss, mouth sores, nosebleeds, postnasal drip, sinus pressure, sneezing, sore throat, tinnitus, trouble swallowing and voice change.   Eyes:  Negative.   Respiratory: Negative for apnea, choking and stridor.   Cardiovascular: Negative.   Gastrointestinal: Negative.   Endocrine: Negative.   Genitourinary: Negative.   Musculoskeletal: Negative.   Skin: Negative.   Neurological: Negative.   Psychiatric/Behavioral: Negative.       Allergies  Review of patient's allergies indicates no known allergies.  Home Medications   Prior to Admission medications   Medication Sig Start Date End Date Taking? Authorizing Provider  albuterol (PROVENTIL) (2.5 MG/3ML) 0.083% nebulizer solution Take 2.5 mg by nebulization every 6 (six) hours as needed for wheezing or shortness of breath.   Yes Historical Provider, MD  amLODipine-benazepril (LOTREL) 5-20 MG per capsule Take 1 capsule by mouth daily. 08/05/14  Yes Historical Provider, MD  fexofenadine-pseudoephedrine (ALLEGRA-D) 60-120 MG per tablet Take 1 tablet by mouth 2 (two) times daily.   Yes Historical Provider, MD  hydrochlorothiazide (MICROZIDE) 12.5 MG capsule Take 12.5 mg by mouth daily. 07/24/14  Yes Historical Provider, MD  sertraline (ZOLOFT) 50 MG tablet Take 50 mg by mouth at bedtime.  08/27/14  Yes Historical Provider, MD   BP 114/52 mmHg  Pulse 95  Temp(Src) 97.9 F (36.6 C) (Oral)  Resp 16  Ht '5\' 2"'$  (1.575 m)  Wt 150 lb (68.04 kg)  BMI 27.43 kg/m2  SpO2 91% Physical Exam  Constitutional: She is oriented to person, place, and time. She appears well-developed and well-nourished. She is cooperative.  Non-toxic appearance. No distress.  Well-developed, well-nourished female, appears stated age, seated comfortably and the ER gurney, speaking in short sentences  HENT:  Head: Normocephalic and atraumatic.  Mouth/Throat: Oropharynx is clear and moist. No oropharyngeal exudate.  Eyes: EOM are normal. Pupils are equal,  round, and reactive to light. Right eye exhibits no discharge. Left eye exhibits no discharge. No scleral icterus.  Neck: Normal range of motion. No JVD present. No  tracheal deviation present. No thyromegaly present.  Cardiovascular: Normal rate, regular rhythm, normal heart sounds and intact distal pulses.  Exam reveals no gallop and no friction rub.   No murmur heard. Symmetrical pulses, radial 2+, dorsal pedis 2+, no lower extremity edema, normal cap refill, no cyanosis or clubbing  Pulmonary/Chest: Tachypnea noted. She has decreased breath sounds in the right middle field and the right lower field. She has wheezes. She has rhonchi. She has no rales. She exhibits no tenderness.  Increased work of breathing, speaking in short sentences, frequent cough, diffuse expiratory wheeze and rhonchi in all lung fields with posterior auscultation  Abdominal: Soft. Bowel sounds are normal. She exhibits no distension and no mass. There is no tenderness. There is no rebound and no guarding.  Musculoskeletal: Normal range of motion. She exhibits no edema or tenderness.  Lymphadenopathy:    She has no cervical adenopathy.  Neurological: She is alert and oriented to person, place, and time. She has normal reflexes. No cranial nerve deficit. She exhibits normal muscle tone. Coordination normal.  Skin: Skin is warm and dry. No rash noted. She is not diaphoretic. No erythema. No pallor.  Psychiatric: She has a normal mood and affect. Her behavior is normal. Judgment and thought content normal.  Nursing note and vitals reviewed.   ED Course  Procedures (including critical care time) Labs Review Labs Reviewed  CBC WITH DIFFERENTIAL/PLATELET - Abnormal; Notable for the following:    Platelets 146 (*)    All other components within normal limits  BASIC METABOLIC PANEL - Abnormal; Notable for the following:    Glucose, Bld 112 (*)    BUN 26 (*)    Creatinine, Ser 1.11 (*)    GFR calc non Af Amer 50 (*)    GFR calc Af Amer 58 (*)    All other components within normal limits  URINALYSIS, ROUTINE W REFLEX MICROSCOPIC (NOT AT [facility])  Randolm Idol, ED    Imaging  Review Dg Chest 2 View  09/02/2014   CLINICAL DATA:  Shortness of breath, smoker  EXAM: CHEST  2 VIEW  COMPARISON:  02/07/2012  FINDINGS: Cardiomediastinal silhouette is stable. No acute infiltrate or pleural effusion. No pulmonary edema. Mild hyperinflation again noted. Atherosclerotic calcifications of thoracic aorta.  IMPRESSION: No active cardiopulmonary disease.  Mild hyperinflation.   Electronically Signed   By: Lahoma Crocker M.D.   On: 09/02/2014 11:22     EKG Interpretation   Date/Time:  Monday September 02 2014 10:23:41 EDT Ventricular Rate:  97 PR Interval:  194 QRS Duration: 83 QT Interval:  341 QTC Calculation: 433 R Axis:   88 Text Interpretation:  Sinus rhythm Borderline right axis deviation  Nonspecific T abnormalities, lateral leads Baseline wander in lead(s) V3  Confirmed by KNOTT MD, DANIEL ([id number]) on 09/02/2014 11:16:07 AM      MDM   Final diagnoses:  COPD exacerbation   Pt with SOB x 3 days following 3 weeks of cough - given albuterol neb from doctor, did 2 treatments this am, without improvement of her breathing, 40 pack-year hx SOB ddx - bronchitis (COPD) vs PNA Basic labs, CXR, EKG, trop, multiple breathing tx, giving IV steroids Pt was reportedly sating low on RA, was put on 2L [place]. If she is able to saturate w/o O2, suspect she will do well  going home, if unable to get her off O2, she will need to be admitted for COPD exacerbation.   Upon reexam, patient was placed on room air and high saturation observed was 84%, she continues to have a diffused, tight expiratory wheeze, decreased breath sounds throughout, worse on the right side.  Currently waiting for chemistry results. Ordered a repeat breathing treatment, and given patient's new oxygen requirement, patient will be admitted for COPD exacerbation, hypoxia. 12:17 PM  Delsa Grana, PA-C  Pt labs significant for elevated BUN/sCr, pt continues to require O2 despite multiple duonebs.  Upon last exam she expresses that  she is getting tired and is also hungry, she is alert and oriented, answering questions.  She continues to have expiratory wheeze throughout all lung fields.  I have discussed BiPAP with her, that she may need help getting CO2 out of her lungs, although with [place] O2 she is getting enough oxygen into her lungs.    Medications  albuterol (PROVENTIL) (2.5 MG/3ML) 0.083% nebulizer solution 5 mg (not administered)  ipratropium (ATROVENT) nebulizer solution 0.5 mg (not administered)  albuterol (PROVENTIL) (2.5 MG/3ML) 0.083% nebulizer solution 5 mg (5 mg Nebulization Given 09/02/14 1129)  ipratropium (ATROVENT) nebulizer solution 0.5 mg (0.5 mg Nebulization Given 09/02/14 1129)  methylPREDNISolone sodium succinate (SOLU-MEDROL) 125 mg/2 mL injection 125 mg (125 mg Intravenous Given 09/02/14 1130)  albuterol (PROVENTIL) (2.5 MG/3ML) 0.083% nebulizer solution 5 mg (5 mg Nebulization Given 09/02/14 1229)  ipratropium (ATROVENT) nebulizer solution 0.5 mg (0.5 mg Nebulization Given 09/02/14 1228)   Filed Vitals:   09/02/14 1215 09/02/14 1230 09/02/14 1300 09/02/14 1315  BP: 106/57 126/62 100/43 114/52  Pulse: 91 96 95 95  Temp:      TempSrc:      Resp: '21 18 17 16  '$ Height:      Weight:      SpO2: 94% 95% 90% 91%    Hospitalist was called for admission.     Delsa Grana, PA-C 09/02/14 1349  Leo Grosser, MD 09/02/14 [phone]

## 2014-09-02 NOTE — ED Provider Notes (Signed)
Medical screening examination/treatment/procedure(s) were conducted as a shared visit with non-physician practitioner(s) and myself.  I personally evaluated the patient during the encounter.   EKG Interpretation   Date/Time:  Monday September 02 2014 10:23:41 EDT Ventricular Rate:  97 PR Interval:  194 QRS Duration: 83 QT Interval:  341 QTC Calculation: 433 R Axis:   88 Text Interpretation:  Sinus rhythm Borderline right axis deviation  Nonspecific T abnormalities, lateral leads Baseline wander in lead(s) V3  Confirmed by Fernie Grimm MD, Jaydi Bray ([id number]) on 09/02/2014 11:16:70 AM     69 year old female presents with ongoing wheezing that she woke up with today. She has albuterol nebs at home, does not have known diagnosed COPD but suspect based off of history that she has underlying lung disease. No recent steroids, no recent antibiotics. Will treat with back-to-back nebulizer treatments to help alleviate symptoms, administer steroids, plan for steroid burst at home if able to stay off of oxygen or admit with new requirement.  See related encounter note   Leo Grosser, MD 09/02/14 [phone]

## 2014-09-03 ENCOUNTER — Other Ambulatory Visit: Payer: Self-pay

## 2014-09-03 DIAGNOSIS — Z720 Tobacco use: Secondary | ICD-10-CM

## 2014-09-03 DIAGNOSIS — J441 Chronic obstructive pulmonary disease with (acute) exacerbation: Secondary | ICD-10-CM

## 2014-09-03 DIAGNOSIS — I10 Essential (primary) hypertension: Secondary | ICD-10-CM

## 2014-09-03 LAB — BASIC METABOLIC PANEL
Anion gap: 8 (ref 5–15)
Anion gap: 10 (ref 5–15)
BUN: 30 mg/dL — ABNORMAL HIGH (ref 6–20)
BUN: 31 mg/dL — ABNORMAL HIGH (ref 6–20)
CALCIUM: 8.6 mg/dL — AB (ref 8.9–10.3)
CHLORIDE: 107 mmol/L (ref 101–111)
CO2: 20 mmol/L — ABNORMAL LOW (ref 22–32)
CO2: 22 mmol/L (ref 22–32)
CREATININE: 1.36 mg/dL — AB (ref 0.44–1.00)
Calcium: 9 mg/dL (ref 8.9–10.3)
Chloride: 107 mmol/L (ref 101–111)
Creatinine, Ser: 1.15 mg/dL — ABNORMAL HIGH (ref 0.44–1.00)
GFR calc Af Amer: 55 mL/min — ABNORMAL LOW (ref >60)
GFR calc non Af Amer: 48 mL/min — ABNORMAL LOW (ref >60)
GFR, EST AFRICAN AMERICAN: 45 mL/min — AB (ref >60)
GFR, EST NON AFRICAN AMERICAN: 39 mL/min — AB (ref >60)
Glucose, Bld: 133 mg/dL — ABNORMAL HIGH (ref 65–99)
Glucose, Bld: 108 mg/dL — ABNORMAL HIGH (ref 65–99)
Potassium: 6.3 mmol/L (ref 3.5–5.1)
Potassium: 4.3 mmol/L (ref 3.5–5.1)
Sodium: 135 mmol/L (ref 135–145)
Sodium: 139 mmol/L (ref 135–145)

## 2014-09-03 LAB — GLUCOSE, CAPILLARY
GLUCOSE-CAPILLARY: 102 mg/dL — AB (ref 65–99)
GLUCOSE-CAPILLARY: 123 mg/dL — AB (ref 65–99)
Glucose-Capillary: 135 mg/dL — ABNORMAL HIGH (ref 65–99)
Glucose-Capillary: 150 mg/dL — ABNORMAL HIGH (ref 65–99)

## 2014-09-03 LAB — CBC
HEMATOCRIT: 41.6 % (ref 36.0–46.0)
HEMOGLOBIN: 13.9 g/dL (ref 12.0–15.0)
MCH: 32.1 pg (ref 26.0–34.0)
MCHC: 33.4 g/dL (ref 30.0–36.0)
MCV: 96.1 fL (ref 78.0–100.0)
PLATELETS: 144 10*3/uL — AB (ref 150–400)
RBC: 4.33 MIL/uL (ref 3.87–5.11)
RDW: 13.8 % (ref 11.5–15.5)
WBC: 6.9 10*3/uL (ref 4.0–10.5)

## 2014-09-03 MED ORDER — LEVOFLOXACIN IN D5W 750 MG/150ML IV SOLN
750.0000 mg | INTRAVENOUS | Status: DC
  Administered 2014-09-03: 750 mg via INTRAVENOUS
  Filled 2014-09-03: qty 150

## 2014-09-03 MED ORDER — METHYLPREDNISOLONE SODIUM SUCC 125 MG IJ SOLR
60.0000 mg | Freq: Two times a day (BID) | INTRAMUSCULAR | Status: DC
  Administered 2014-09-03 – 2014-09-04 (×2): 60 mg via INTRAVENOUS
  Filled 2014-09-03 (×2): qty 2

## 2014-09-03 MED ORDER — ZOLPIDEM TARTRATE 5 MG PO TABS
5.0000 mg | ORAL_TABLET | Freq: Every evening | ORAL | Status: DC | PRN
  Administered 2014-09-03 (×2): 5 mg via ORAL
  Filled 2014-09-03 (×2): qty 1

## 2014-09-03 NOTE — Progress Notes (Addendum)
[facility] PROGRESS NOTE  Suzanne Dickson OHY:[id number] DOB: 12-Feb-1945 DOA: 09/02/2014 PCP: No primary care provider on file.  Brief Summary  The patient is a 69 year old female with history of hypertension, bronchitis, chronic sinusitis, 40-pack-year smoking history who presented to the emergency department with shortness of breath and productive cough for 3 weeks prior to admission. Her cough became productive of thick yellow sputum, and her albuterol nebulizer was no longer offering her relief at home. In the emergency department, she had oxygen saturations in the low to mid 80s on room air was started on 2 L oxygen by nasal cannula. She was given IV steroids and DuoNeb's and was admitted for COPD exacerbation.  Assessment/Plan  Acute hypoxic respiratory failure secondary to acute COPD exacerbation, feeling much better today but still requiring oxygen -  Continue levofloxacin -  Changed to twice a day solumedrol -  Continue duo nebs every 6 hours -  Wean oxygen as tolerated  Essential hypertension, blood pressures low normal -  Hold amlodipine -  Continue to hold benazepril and HCTZ  Mildly elevated BUN and creatinine, creatinine continuing to rise which may be secondary to her IV steroids -  Continue IV fluids and continue to hold her ARB and HCTZ -  Repeat BMP in a.m.  Tobacco abuse - 40 pack year history, contributing to shortness of breath, cessation counseling - Nicotine patch  Mild hyperglycemia secondary to steroids,  -  continue sliding scale insulin -  Check hemoglobin A1c  Hyperkalemia, likely spurious. Repeat BMP today and check EKG. - EKG: No peaked T waves -  Repeat BMP has normal potassium  Diet:  Diabetic Access:  PIV IVF:  Yes Proph:  Heparin  Code Status: Full Family Communication: Patient alone Disposition Plan: Pending further improvement in breathing   Consultants:  None  Procedures:  Chest x-ray  Antibiotics:  Levofloxacin from  8/8  HPI/Subjective:  States that her breathing has improved somewhat although she still has some tightness in her chest, wheezing, productive cough.  She does not wear home oxygen.  Her oxygen came off overnight and she became Suzanne Dickson of breath so was replaced.  Objective: Filed Vitals:   09/02/14 2128 09/03/14 0529 09/03/14 0532 09/03/14 0845  BP: 129/78  118/61   Pulse: 100  94   Temp: 98.7 F (37.1 C)  98.4 F (36.9 C)   TempSrc: Oral  Oral   Resp: 18  18   Height:      Weight:  73.619 kg (162 lb 4.8 oz)    SpO2: 95%  94% 93%    Intake/Output Summary (Last 24 hours) at 09/03/14 1418 Last data filed at 09/03/14 0600  Gross per 24 hour  Intake 1044.17 ml  Output      0 ml  Net 1044.17 ml   Filed Weights   09/02/14 1046 09/03/14 0529  Weight: 68.04 kg (150 lb) 73.619 kg (162 lb 4.8 oz)   Body mass index is 29.68 kg/(m^2).  Exam:   General:  Adult female, No acute distress on 2 L nasal cannula  HEENT:  NCAT, MMM  Cardiovascular:  RRR, nl S1, S2 no mrg, 2+ pulses, warm extremities  Respiratory:  Diminished bilateral breath sounds with end expiratory wheeze, prolonged expiratory phase, no focal rales or rhonchi, no increased WOB  Abdomen:   NABS, soft, NT/ND  MSK:   Normal tone and bulk, no LEE  Neuro:  Grossly intact  Data Reviewed: Basic Metabolic Panel:  Recent Labs Lab 09/02/14 1050  09/03/14 0705 09/03/14 1045  NA 140 135 139  K 4.3 6.3* 4.3  CL 111 107 107  CO2 23 20* 22  GLUCOSE 112* 133* 108*  BUN 26* 30* 31*  CREATININE 1.11* 1.15* 1.36*  CALCIUM 9.3 8.6* 9.0   Liver Function Tests: No results for input(s): AST, ALT, ALKPHOS, BILITOT, PROT, ALBUMIN in the last 168 hours. No results for input(s): LIPASE, AMYLASE in the last 168 hours. No results for input(s): AMMONIA in the last 168 hours. CBC:  Recent Labs Lab 09/02/14 1050 09/03/14 0705  WBC 6.9 6.9  NEUTROABS 4.8  --   HGB 14.0 13.9  HCT 43.3 41.6  MCV 96.4 96.1  PLT 146* 144*     No results found for this or any previous visit (from the past 240 hour(s)).   Studies: Dg Chest 2 View  09/02/2014   CLINICAL DATA:  Shortness of breath, smoker  EXAM: CHEST  2 VIEW  COMPARISON:  02/07/2012  FINDINGS: Cardiomediastinal silhouette is stable. No acute infiltrate or pleural effusion. No pulmonary edema. Mild hyperinflation again noted. Atherosclerotic calcifications of thoracic aorta.  IMPRESSION: No active cardiopulmonary disease.  Mild hyperinflation.   Electronically Signed   By: Lahoma Crocker M.D.   On: 09/02/2014 11:22    Scheduled Meds: . amLODipine  5 mg Oral Daily  . budesonide (PULMICORT) nebulizer solution  0.5 mg Nebulization BID  . guaiFENesin  1,200 mg Oral BID  . heparin  5,000 Units Subcutaneous 3 times per day  . insulin aspart  0-9 Units Subcutaneous TID WC  . ipratropium-albuterol  3 mL Nebulization Q6H  . levofloxacin (LEVAQUIN) IV  750 mg Intravenous Q48H  . methylPREDNISolone (SOLU-MEDROL) injection  60 mg Intravenous Q6H  . nicotine  21 mg Transdermal Daily  . pantoprazole  40 mg Oral Daily  . sertraline  50 mg Oral QHS   Continuous Infusions: . sodium chloride 50 mL/hr at 09/02/14 1757    Principal Problem:   Obstructive chronic bronchitis with exacerbation Active Problems:   TOBACCO USER   Essential hypertension   RHINOSINUSITIS, CHRONIC   Chronic bronchitis   Acute bronchitis    Time spent: 30 min    Suzanne Dickson, [facility] Pager [phone]. If 7PM-7AM, please contact night-coverage at [email or web address], password [facility] 09/03/2014, 2:18 PM  LOS: 1 day

## 2014-09-03 NOTE — Evaluation (Signed)
Physical Therapy Evaluation Patient Details Name: Suzanne Dickson MRN: [id number] DOB: 1945-09-16 Today's Date: 09/03/2014   History of Present Illness  Patient is a 68 y/o female who presents with SOB x3 weeks with productive cough. Found to have acute COPD exacerbation. PMH includes HTN and bronchitis.  Clinical Impression  Patient presents with dyspnea on exertion, impaired cardiovascular endurance and decrease in oxygen saturation with activity impacting safe mobility. Education provided on pursed lip breathing. Encouraged ambulation multiple times per day to improve utilization and uptake of oxygen during mobility. Will need to negotiate steps prior to d/c. Education provided on energy conservation techniques. Appropriate for OP pulmonary rehab to maximize independence and mobility. Will follow acutely.    Follow Up Recommendations Outpatient PT (pulmonary rehab)    Equipment Recommendations  None recommended by PT    Recommendations for Other Services       Precautions / Restrictions Precautions Precautions: None Precaution Comments: monitor 02 Restrictions Weight Bearing Restrictions: No      Mobility  Bed Mobility Overal bed mobility: Modified Independent                Transfers Overall transfer level: Needs assistance Equipment used: None Transfers: Sit to/from Stand Sit to Stand: Supervision         General transfer comment: Supervision for safety.   Ambulation/Gait Ambulation/Gait assistance: Supervision Ambulation Distance (Feet): 200 Feet Assistive device: None Gait Pattern/deviations: Step-through pattern;Decreased stride length   Gait velocity interpretation: <1.8 ft/sec, indicative of risk for recurrent falls General Gait Details: Pt with steady gait. Sa02 dropped to 82% on 2L 02 [place]. Cues for pursed lip breathing. resolved within 20 sec to 90%.  Stairs            Wheelchair Mobility    Modified Rankin (Stroke Patients Only)        Balance Overall balance assessment: No apparent balance deficits (not formally assessed)                                           Pertinent Vitals/Pain Pain Assessment: No/denies pain    Home Living Family/patient expects to be discharged to:: Private residence Living Arrangements: Spouse/significant other Available Help at Discharge: Family;Available PRN/intermittently Type of Home: House Home Access: Stairs to enter   [employer] of Steps: 2 Home Layout: One level Home Equipment: None      Prior Function Level of Independence: Independent               Hand Dominance        Extremity/Trunk Assessment   Upper Extremity Assessment: Defer to OT evaluation           Lower Extremity Assessment: Overall WFL for tasks assessed         Communication   Communication: No difficulties  Cognition Arousal/Alertness: Awake/alert Behavior During Therapy: WFL for tasks assessed/performed Overall Cognitive Status: Within Functional Limits for tasks assessed                      General Comments      Exercises        Assessment/Plan    PT Assessment Patient needs continued PT services  PT Diagnosis Difficulty walking   PT Problem List Cardiopulmonary status limiting activity;Decreased activity tolerance;Decreased mobility  PT Treatment Interventions Gait training;Therapeutic activities;Therapeutic exercise;Patient/family education;Stair training   PT Goals (Current goals can  be found in the Care Plan section) Acute Rehab PT Goals Patient Stated Goal: to breathe better PT Goal Formulation: With patient Time For Goal Achievement: 09/17/14 Potential to Achieve Goals: Good    Frequency Min 3X/week   Barriers to discharge        Co-evaluation               End of Session Equipment Utilized During Treatment: Oxygen Activity Tolerance: Treatment limited secondary to medical complications (Comment) (drop in  oxygen saturation) Patient left: in bed;with call bell/phone within reach Nurse Communication: Mobility status;Other (comment) (drop in sa02.)         Time: 1520-1540 PT Time Calculation (min) (ACUTE ONLY): 20 min   Charges:   PT Evaluation $Initial PT Evaluation Tier I: 1 Procedure     PT G Codes:        Adelaido Nicklaus A Mahiya Kercheval 09/03/2014, 3:42 PM  Wray Kearns, [place], DPT [phone]

## 2014-09-03 NOTE — Progress Notes (Signed)
Patient requesting something to help her sleep. Baltazar Najjar NP paged. [facility] [employer]

## 2014-09-04 LAB — BASIC METABOLIC PANEL
Anion gap: 6 (ref 5–15)
BUN: 36 mg/dL — AB (ref 6–20)
CO2: 22 mmol/L (ref 22–32)
Calcium: 9.1 mg/dL (ref 8.9–10.3)
Chloride: 111 mmol/L (ref 101–111)
Creatinine, Ser: 1.19 mg/dL — ABNORMAL HIGH (ref 0.44–1.00)
GFR calc Af Amer: 53 mL/min — ABNORMAL LOW (ref >60)
GFR calc non Af Amer: 46 mL/min — ABNORMAL LOW (ref >60)
GLUCOSE: 132 mg/dL — AB (ref 65–99)
Potassium: 4.4 mmol/L (ref 3.5–5.1)
SODIUM: 139 mmol/L (ref 135–145)

## 2014-09-04 LAB — GLUCOSE, CAPILLARY
GLUCOSE-CAPILLARY: 131 mg/dL — AB (ref 65–99)
GLUCOSE-CAPILLARY: 107 mg/dL — AB (ref 65–99)

## 2014-09-04 MED ORDER — NICOTINE 14 MG/24HR TD PT24: 14.0000 mg | MEDICATED_PATCH | Freq: Every day | TRANSDERMAL | Status: DC

## 2014-09-04 MED ORDER — LEVOFLOXACIN 750 MG PO TABS: 750.0000 mg | ORAL_TABLET | ORAL | Status: DC

## 2014-09-04 MED ORDER — IPRATROPIUM-ALBUTEROL 0.5-2.5 (3) MG/3ML IN SOLN
3.0000 mL | Freq: Four times a day (QID) | RESPIRATORY_TRACT | Status: DC
  Administered 2014-09-04: 3 mL via RESPIRATORY_TRACT
  Filled 2014-09-04 (×2): qty 3

## 2014-09-04 MED ORDER — PREDNISONE 20 MG PO TABS: 20.0000 mg | ORAL_TABLET | Freq: Every day | ORAL | Status: DC

## 2014-09-04 MED ORDER — IPRATROPIUM-ALBUTEROL 0.5-2.5 (3) MG/3ML IN SOLN: 3.0000 mL | Freq: Four times a day (QID) | RESPIRATORY_TRACT | Status: DC

## 2014-09-04 MED ORDER — BUDESONIDE-FORMOTEROL FUMARATE 160-4.5 MCG/ACT IN AERO: 2.0000 | INHALATION_SPRAY | Freq: Two times a day (BID) | RESPIRATORY_TRACT | Status: DC

## 2014-09-04 NOTE — Care Management Important Message (Signed)
Important Message  Patient Details  Name: Suzanne Dickson MRN: [id number] Date of Birth: 1945/02/24   Medicare Important Message Given:  Yes-second notification given    Delorse Lek 09/04/2014, 11:32 AM

## 2014-09-04 NOTE — Discharge Summary (Signed)
Physician Discharge Summary  Suzanne Dickson CNO:[id number] DOB: 11/30/1945 DOA: 09/02/2014  PCP: No primary care provider on file.  Admit date: 09/02/2014 Discharge date: 09/04/2014  Recommendations for Outpatient Follow-up:  1. Recommend pulmonary rehabilitation as an outpatient. Case manager provided the patient with information regarding this. 2. Home oxygen, 2 L to use with exertion 3. Prednisone and antibiotics given along with duonebs and ICS/LABA 4. F/u with PCP in 1-2 weeks and she should follow up with Pulmonology within 1 month also.    Discharge Diagnoses:  Principal Problem:   Obstructive chronic bronchitis with exacerbation Active Problems:   TOBACCO USER   Essential hypertension   RHINOSINUSITIS, CHRONIC   Chronic bronchitis   Acute bronchitis   Discharge Condition: Stable, improved  Diet recommendation: Diabetic diet  Wt Readings from Last 3 Encounters:  09/04/14 72.96 kg (160 lb 13.6 oz)  07/30/09 69.854 kg (154 lb)  06/04/09 69.4 kg (153 lb)    History of present illness:  The patient is a 69 year old female with history of hypertension, bronchitis, chronic sinusitis, 40-pack-year smoking history who presented to the emergency department with shortness of breath and productive cough for 3 weeks prior to admission. Her cough became productive of thick yellow sputum, and her albuterol nebulizer was no longer offering her relief at home. In the emergency department, she had oxygen saturations in the low to mid 80s on room air was started on 2 L oxygen by nasal cannula. She was given IV steroids and DuoNeb's and was admitted for COPD exacerbation.  Hospital Course:   Acute hypoxic respiratory failure secondary to acute COPD exacerbation. She was started on Solu-Medrol, DuoNeb's, 2 L nasal cannula, and levofloxacin. She had rapid improvement of her subjective symptoms. She transitioned to room air on 8/9, but continued to have some difficulty breathing with exertion. On  the date of discharge, she had normal oxygen saturations at rest on room air but when she exerted herself her oxygen saturations dropped to the mid 80s. She was prescribed 2 L home O2 to use with exertion. She should continue prednisone on a longer taper because she is still having some wheezing on exam. She should complete a 5 day course of levofloxacin. She already has a nebulizer machine at home and was prescribed duo nebs for home.  She was started on inhaled cortical steroids and long-acting beta agonist.  Essential hypertension, blood pressures were low normal. Her home blood pressure medications were held. She was advised to follow-up with her primary care doctor in a proximally 1 week for repeat blood pressure check. Her blood pressure medications may need to be resumed at that time.  Tobacco abuse with 40-pack-year history. Advised to quit smoking and provided a nicotine patch.  Mild steroid-induced hyperglycemia. Hemoglobin A1c is pending. Her steroids were tapered and I do not think that she will need any treatment for this at home.  Hyperkalemia was spurious. Her EKG demonstrated no evidence of peaked T waves and repeat BMP was back to normal without intervention.  Consultants:  None  Procedures:  Chest x-ray  Antibiotics:  Levofloxacin from 8/8  Discharge Exam: Filed Vitals:   09/04/14 0440  BP: 127/69  Pulse: 93  Temp: 97.7 F (36.5 C)  Resp: 18   Filed Vitals:   09/04/14 0440 09/04/14 0938 09/04/14 1030 09/04/14 1031  BP: 127/69     Pulse: 93     Temp: 97.7 F (36.5 C)     TempSrc: Oral     Resp: 18  Height:      Weight: 72.96 kg (160 lb 13.6 oz)     SpO2: 99% 96% 97% 90%     General: Adult female, No acute distress on 2 L nasal cannula  HEENT: NCAT, MMM  Cardiovascular: RRR, nl S1, S2 no mrg, 2+ pulses, warm extremities  Respiratory: Diminished bilateral breath sounds with end expiratory wheeze, prolonged expiratory phase, no focal rales or  rhonchi, no increased WOB  Abdomen: NABS, soft, NT/ND  MSK: Normal tone and bulk, no LEE  Neuro: Grossly intact  Discharge Instructions      Discharge Instructions    Call MD for:  difficulty breathing, headache or visual disturbances    Complete by:  As directed      Call MD for:  extreme fatigue    Complete by:  As directed      Call MD for:  hives    Complete by:  As directed      Call MD for:  persistant dizziness or light-headedness    Complete by:  As directed      Call MD for:  persistant nausea and vomiting    Complete by:  As directed      Call MD for:  severe uncontrolled pain    Complete by:  As directed      Call MD for:  temperature >100.4    Complete by:  As directed      Diet - low sodium heart healthy    Complete by:  As directed      Discharge instructions    Complete by:  As directed   You were hospitalized with difficulty breathing caused by COPD or chronic bronchitis from smoking.  Please quit smoking as this is the only thing that will truly help your lungs in the long term.  For now, please take prednisone in decreasing doses for the next few weeks to help reduce lung inflammation.  Use symbicort every day to help reduce the risk of having lung problems requiring hospitalization.  Duonebs may be used four times a day to help your breathing until you follow up with your primary care doctor.  Please take the antibiotic levofloxacin every other day until both tabs are gone.  Your next dose is due on Thursday and your final dose is due on Saturday.  If you have worsening shortness of breath, please return to the hospital right away.  Please wear oxygen 2L when you are up and moving around.  Finally, your blood pressure has been low since you have been in the hospital which is common when people are ill.  Please do NOT take any of your blood pressure medications when you get home.  Follow up with your primary care doctor in about 1-2 weeks for repeat blood pressure  check.  They may restart your medications at that time if your blood pressure is high.     Increase activity slowly    Complete by:  As directed             Medication List    STOP taking these medications        amLODipine-benazepril 5-20 MG per capsule  Commonly known as:  LOTREL     hydrochlorothiazide 12.5 MG capsule  Commonly known as:  MICROZIDE      TAKE these medications        albuterol (2.5 MG/3ML) 0.083% nebulizer solution  Commonly known as:  PROVENTIL  Take 2.5 mg by nebulization every 6 (six) hours  as needed for wheezing or shortness of breath.     budesonide-formoterol 160-4.5 MCG/ACT inhaler  Commonly known as:  SYMBICORT  Inhale 2 puffs into the lungs 2 (two) times daily.     fexofenadine-pseudoephedrine 60-120 MG per tablet  Commonly known as:  ALLEGRA-D  Take 1 tablet by mouth 2 (two) times daily.     ipratropium-albuterol 0.5-2.5 (3) MG/3ML Soln  Commonly known as:  DUONEB  Take 3 mLs by nebulization every 6 (six) hours.     levofloxacin 750 MG tablet  Commonly known as:  LEVAQUIN  Take 1 tablet (750 mg total) by mouth every other day.  Start taking on:  09/05/2014     nicotine 14 mg/24hr patch  Commonly known as:  NICODERM CQ - dosed in mg/24 hours  Place 1 patch (14 mg total) onto the skin daily.     predniSONE 20 MG tablet  Commonly known as:  DELTASONE  Take 1 tablet (20 mg total) by mouth daily with breakfast.     sertraline 50 MG tablet  Commonly known as:  ZOLOFT  Take 50 mg by mouth at bedtime.       Follow-up Information    Follow up with [facility]    . Schedule an appointment as soon as possible for a visit in 1 week.   Contact information:   [address] [place] [address] [phone]      Follow up with Deneise Lever, MD. Schedule an appointment as soon as possible for a visit in 1 month.   Specialty:  Pulmonary Disease   Contact information:   [place] [address] [phone]        The results of significant diagnostics from this hospitalization (including imaging, microbiology, ancillary and laboratory) are listed below for reference.    Significant Diagnostic Studies: Dg Chest 2 View  09/02/2014   CLINICAL DATA:  Shortness of breath, smoker  EXAM: CHEST  2 VIEW  COMPARISON:  02/07/2012  FINDINGS: Cardiomediastinal silhouette is stable. No acute infiltrate or pleural effusion. No pulmonary edema. Mild hyperinflation again noted. Atherosclerotic calcifications of thoracic aorta.  IMPRESSION: No active cardiopulmonary disease.  Mild hyperinflation.   Electronically Signed   By: Lahoma Crocker M.D.   On: 09/02/2014 11:22    Microbiology: No results found for this or any previous visit (from the past 240 hour(s)).   Labs: Basic Metabolic Panel:  Recent Labs Lab 09/02/14 1050 09/03/14 0705 09/03/14 1045 09/04/14 0406  NA 140 135 139 139  K 4.3 6.3* 4.3 4.4  CL 111 107 107 111  CO2 23 20* 22 22  GLUCOSE 112* 133* 108* 132*  BUN 26* 30* 31* 36*  CREATININE 1.11* 1.15* 1.36* 1.19*  CALCIUM 9.3 8.6* 9.0 9.1   Liver Function Tests: No results for input(s): AST, ALT, ALKPHOS, BILITOT, PROT, ALBUMIN in the last 168 hours. No results for input(s): LIPASE, AMYLASE in the last 168 hours. No results for input(s): AMMONIA in the last 168 hours. CBC:  Recent Labs Lab 09/02/14 1050 09/03/14 0705  WBC 6.9 6.9  NEUTROABS 4.8  --   HGB 14.0 13.9  HCT 43.3 41.6  MCV 96.4 96.1  PLT 146* 144*   Cardiac Enzymes: No results for input(s): CKTOTAL, CKMB, CKMBINDEX, TROPONINI in the last 168 hours. BNP: BNP (last 3 results)  Recent Labs  09/02/14 1625  BNP 14.0    ProBNP (last 3 results) No results for input(s): PROBNP  in the last 8760 hours.  CBG:  Recent Labs Lab 09/03/14 0731 09/03/14 1248 09/03/14 1742 09/03/14 2201 09/04/14 0736  GLUCAP 135* 102* 150* 123* 131*    Time coordinating discharge: 35  minutes  Signed:  Starlene Consuegra  [facility] 09/04/2014, 11:49 AM

## 2014-09-04 NOTE — Progress Notes (Addendum)
SATURATION QUALIFICATIONS: (This note is used to comply with regulatory documentation for home oxygen)  Patient Saturations on Room Air at Rest = 96%  Patient Saturations on Room Air while Ambulating = 85%  Patient Saturations on 2 Liters of oxygen while Ambulating = 97%  Patient saturations on 2 liters of oxygen at rest =98%  Please briefly explain why patient needs home oxygen:obstructive chronic bronchitis excaerbation

## 2014-09-04 NOTE — Care Management Note (Signed)
Case Management Note  Patient Details  Name: Suzanne Dickson MRN: [id number] Date of Birth: [birth year]  Subjective/Objective:                    Action/Plan:  Home oxygen ordered through [facility] . See prior note regarding pulmonary rehab  Expected Discharge Date:                  Expected Discharge Plan:     In-House Referral:     Discharge planning Services     Post Acute Care Choice:    Choice offered to:     DME Arranged:  Oxygen DME Agency:  [place]:    [place] Agency:     Status of Service:  Completed, signed off  Medicare Important Message Given:  Yes-second notification given Date Medicare IM Given:    Medicare IM give by:    Date Additional Medicare IM Given:    Additional Medicare Important Message give by:     If discussed at [place] of Stay Meetings, dates discussed:    Additional Comments:  Marilu Favre, RN 09/04/2014, 11:48 AM

## 2014-09-04 NOTE — Progress Notes (Signed)
[phone] Notified by case manager to discuss pulmonary rehab with pt. Gave pt pulmonary brochure and got contact info. Pt stated she did CRP 2 years ago so she is familiar of where located. Will give contact info to [facility]. Pt in agreement.  Graylon Good RN BSN 09/04/2014 10:34 AM

## 2014-09-04 NOTE — Progress Notes (Signed)
Discharge instructions gone over with patient. Home medications gone over. Prescriptions were electronically sent to patient's pharmacy. Follow up appointments to be made. Diet, use of oxygen, and reasons to call the doctor gone over. Patient is advised to complete antibiotics and to stop smoking. Patient has home oxygen with her and is being discharged with oxygen to use at home as needed. Patient verbalized understanding of instructions.

## 2014-09-04 NOTE — Care Management (Signed)
PT recommending OT pulmonary rehab . [place] outpatient pulmonary rehab ext [phone] spoke to [place] , who referred NCM to [facility] . Spoke to [place] who will bring patient information on pulmonary rehab and get patient's contact information and patient will be called with appointment.   Magdalen Spatz RN BSN [phone]

## 2014-09-05 LAB — HEMOGLOBIN A1C
Hgb A1c MFr Bld: 6 % — ABNORMAL HIGH (ref 4.8–5.6)
Mean Plasma Glucose: 126 mg/dL

## 2014-09-16 ENCOUNTER — Ambulatory Visit (INDEPENDENT_AMBULATORY_CARE_PROVIDER_SITE_OTHER): Payer: Medicare Other | Admitting: Family Medicine

## 2014-09-16 VITALS — BP 152/69 | HR 102 | Temp 97.8°F | Resp 16 | Ht 62.0 in | Wt 156.0 lb

## 2014-09-16 DIAGNOSIS — I10 Essential (primary) hypertension: Secondary | ICD-10-CM

## 2014-10-07 ENCOUNTER — Telehealth (HOSPITAL_COMMUNITY): Payer: Self-pay

## 2014-10-07 NOTE — Telephone Encounter (Signed)
Patient was seen by [facility] while patient was in the hospital.  Cardiac rehab referred patient to pulmonary rehab.  Patient has not yet seen a Pulmonologist but was told to call and make an appointment with Dr. Annamaria Boots. Patient states that she will do that this week. We will wait for patient to see pulmonologist and get a referral from Dr. Annamaria Boots before we start patient in program.  I will follow up with patient to make sure she has made her appointment.

## 2014-10-10 ENCOUNTER — Encounter: Payer: Self-pay | Admitting: Internal Medicine

## 2014-10-10 ENCOUNTER — Ambulatory Visit (INDEPENDENT_AMBULATORY_CARE_PROVIDER_SITE_OTHER): Payer: Medicare Other | Admitting: Internal Medicine

## 2014-10-10 VITALS — BP 138/72 | HR 105 | Ht 62.5 in | Wt 160.0 lb

## 2014-10-10 DIAGNOSIS — J449 Chronic obstructive pulmonary disease, unspecified: Secondary | ICD-10-CM | POA: Diagnosis not present

## 2014-10-10 DIAGNOSIS — I10 Essential (primary) hypertension: Secondary | ICD-10-CM | POA: Diagnosis not present

## 2014-10-10 DIAGNOSIS — J9611 Chronic respiratory failure with hypoxia: Secondary | ICD-10-CM | POA: Diagnosis not present

## 2014-10-10 MED ORDER — FAMOTIDINE 20 MG PO TABS: ORAL_TABLET | ORAL | Status: DC

## 2014-10-10 MED ORDER — PANTOPRAZOLE SODIUM 40 MG PO TBEC: 40.0000 mg | DELAYED_RELEASE_TABLET | Freq: Every day | ORAL | Status: DC

## 2014-10-10 MED ORDER — BUDESONIDE-FORMOTEROL FUMARATE 160-4.5 MCG/ACT IN AERO: 2.0000 | INHALATION_SPRAY | Freq: Two times a day (BID) | RESPIRATORY_TRACT | Status: DC

## 2014-10-10 NOTE — Progress Notes (Signed)
Subjective:     Patient ID: Suzanne Dickson, female   DOB: 1945/12/23,     MRN: [id number]  HPI   69 yobf quit smoking on admit    Admit date: 09/02/2014 Discharge date: 09/04/2014  Recommendations for Outpatient Follow-up:  1. Recommend pulmonary rehabilitation as an outpatient. Case manager provided the patient with information regarding this. 2. Home oxygen, 2 L to use with exertion 3. Prednisone and antibiotics given along with duonebs and ICS/LABA 4. F/u with PCP in 1-2 weeks and she should follow up with Pulmonology within 1 month also.  Discharge Diagnoses:  Principal Problem:  Obstructive chronic bronchitis with exacerbation Active Problems:  TOBACCO USER  Essential hypertension  RHINOSINUSITIS, CHRONIC  Chronic bronchitis  Acute bronchitis   Discharge Condition: Stable, improved  Diet recommendation: Diabetic diet  Wt Readings from Last 3 Encounters:  09/04/14 72.96 kg (160 lb 13.6 oz)  07/30/09 69.854 kg (154 lb)  06/04/09 69.4 kg (153 lb)    History of present illness:  The patient is a 69 year old female with history of hypertension, bronchitis, chronic sinusitis, 40-pack-year smoking history who presented to the emergency department with shortness of breath and productive cough for 3 weeks prior to admission. Her cough became productive of thick yellow sputum, and her albuterol nebulizer was no longer offering her relief at home. In the emergency department, she had oxygen saturations in the low to mid 80s on room air was started on 2 L oxygen by nasal cannula. She was given IV steroids and DuoNeb's and was admitted for COPD exacerbation.  Hospital Course:   Acute hypoxic respiratory failure secondary to acute COPD exacerbation. She was started on Solu-Medrol, DuoNeb's, 2 L nasal cannula, and levofloxacin. She had rapid improvement of her subjective symptoms. She transitioned to room air on 8/9, but continued to have some difficulty breathing  with exertion. On the date of discharge, she had normal oxygen saturations at rest on room air but when she exerted herself her oxygen saturations dropped to the mid 80s. She was prescribed 2 L home O2 to use with exertion. She should continue prednisone on a longer taper because she is still having some wheezing on exam. She should complete a 5 day course of levofloxacin. She already has a nebulizer machine at home and was prescribed duo nebs for home. She was started on inhaled cortical steroids and long-acting beta agonist.  Essential hypertension, blood pressures were low normal. Her home blood pressure medications were held. She was advised to follow-up with her primary care doctor in a proximally 1 week for repeat blood pressure check. Her blood pressure medications may need to be resumed at that time.  Tobacco abuse with 40-pack-year history. Advised to quit smoking and provided a nicotine patch.  Mild steroid-induced hyperglycemia. Hemoglobin A1c is pending. Her steroids were tapered and I do not think that she will need any treatment for this at home.  Hyperkalemia was spurious. Her EKG demonstrated no evidence of peaked T waves and repeat BMP was back to normal without intervention.  Consultants:  None          10/10/2014 1st [facility] office visit/ Reniyah Gootee  / on ACEi since at least 2011 per chart review Chief Complaint  Patient presents with  . Pulmonary Consult    Self referral. Pt c/o increased SOB for the past month, worse x 1 wk. She gets out of breath walking to her mailbox and was out of breath walking from lobby to exam room today.  first aware of a resp problem in her 50/s = freq sinus infections and occ bad chest cold with flares but ok breathing between flares / saba just during flares but march 2016 downhill with short of breath with adls  And daily mostly dry cough eval by Dr Montez Morita and Dr Radene Journey but notes not available and then abruptsly worse prior to  above admit and again worse now that off prednisone   No obvious day to day or daytime variability or assoc   cp or chest tightness, subjective wheeze or overt sinus or hb symptoms. No unusual exp hx or h/o childhood pna/ asthma or knowledge of premature birth.  Sleeping ok without nocturnal  or early am exacerbation  of respiratory  c/o's or need for noct saba. Also denies any obvious fluctuation of symptoms with weather or environmental changes or other aggravating or alleviating factors except as outlined above   Current Medications, Allergies, Complete Past Medical History, Past Surgical History, Family History, and Social History were reviewed in [employer] record.  ROS  The following are not active complaints unless bolded sore throat, dysphagia, dental problems, itching, sneezing,  nasal congestion or excess/ purulent secretions, ear ache,   fever, chills, sweats, unintended wt loss, classically pleuritic or exertional cp, hemoptysis,  orthopnea pnd or leg swelling, presyncope, palpitations, abdominal pain, anorexia, nausea, vomiting, diarrhea  or change in bowel or bladder habits, change in stools or urine, dysuria,hematuria,  rash, arthralgias, visual complaints, headache, numbness, weakness or ataxia or problems with walking or coordination,  change in mood/affect or memory.          Review of Systems     Objective:   Physical Exam  amb bm nad classic pseudowheeze wearing 02   Wt Readings from Last 3 Encounters:  10/10/14 160 lb (72.576 kg)  09/16/14 156 lb (70.761 kg)  09/04/14 160 lb 13.6 oz (72.96 kg)    Vital signs reviewed    HEENT: nl dentition, turbinates, and orophanx. Nl external ear canals without cough reflex   NECK :  without JVD/Nodes/TM/ nl carotid upstrokes bilaterally   LUNGS: no acc muscle use, distant bilat bs/ no wheeze    CV:  RRR  no s3 or murmur or increase in P2, no edema   ABD:  soft and nontender with nl excursion  in the supine position. No bruits or organomegaly, bowel sounds nl  MS:  warm without deformities, calf tenderness, cyanosis or clubbing  SKIN: warm and dry without lesions    NEURO:  alert, approp, no deficits    I personally reviewed images and agree with radiology impression as follows:  CXR:  09/02/14 No active cardiopulmonary disease. Mild hyperinflation.      Assessment:

## 2014-10-10 NOTE — Assessment & Plan Note (Signed)
Started on 02 2lpm at d/c 09/04/14   Adequate control on present rx, reviewed > no change in rx needed  For now but hope to wean off on f/u

## 2014-10-10 NOTE — Assessment & Plan Note (Signed)
ACE inhibitors are problematic in  pts with airway complaints because  even experienced pulmonologists can't always distinguish ace effects from copd/asthma/pnds/ allergies etc.  By themselves they don't actually cause a problem, much like oxygen can't by itself start a fire, but they certainly serve as a powerful catalyst or enhancer for any "fire"  or inflammatory process in the upper airway, be it caused by an ET  tube or more commonly reflux (especially in the obese or pts with known GERD or who are on biphoshonates) or URI's, due to interference with bradykinin clearance.  The effects of acei on bradykinin levels occurs in 100% of pt's on acei (unless they surreptitiously stop the med!) but the classic cough is only reported in 5%.  This leaves 95% of pts on acei's  with a variety of syndromes including no identifiable symptom in most  vs non-specific symptoms that wax and wane depending on what other insult is occuring at the level of the upper airway, esp gerd here  Will hold the acei for now

## 2014-10-10 NOTE — Assessment & Plan Note (Addendum)
DDX of  difficult airways management all start with A and  include Adherence, Ace Inhibitors, Acid Reflux, Active Sinus Disease, Alpha 1 Antitripsin deficiency, Anxiety masquerading as Airways dz,  ABPA,  allergy(esp in young), Aspiration (esp in elderly), Adverse effects of meds,  Active smokers, A bunch of PE's (a small clot burden can't cause this syndrome unless there is already severe underlying pulm or vascular dz with poor reserve) plus two Bs  = Bronchiectasis and Beta blocker use..and one C= CHF  Adherence is always the initial "prime suspect" and is a multilayered concern that requires a "trust but verify" approach in every patient - starting with knowing how to use medications, especially inhalers, correctly, keeping up with refills and understanding the fundamental difference between maintenance and prns vs those medications only taken for a very short course and then stopped and not refilled.  The proper method of use, as well as anticipated side effects, of a metered-dose inhaler are discussed and demonstrated to the patient. Improved effectiveness after extensive coaching during this visit to a level of approximately  75% so rec maint rx with symbicort 160 2bid and prn duoneb to back it up  ACEi effects top of the list of usual suspects > see hbp  ? Acid (or non-acid) GERD > always difficult to exclude as up to 75% of pts in some series report no assoc GI/ Heartburn symptoms> rec max (24h)  acid suppression and diet restrictions/ reviewed and instructions given in writing.   ? Active smoking > reinforced need to maintain off  ? Anxiety > dx of exclusion/ reinforced should be able to get her breathing back to where it was in march 2016    ? chf > bnp was 14  87/8/16 rules it out   I had an extended discussion with the patient reviewing all relevant studies completed to date and  Lasting 72 m    Each maintenance medication was reviewed in detail including most importantly the  difference between maintenance and prns and under what circumstances the prns are to be triggered using an action plan format that is not reflected in the computer generated alphabetically organized AVS.    Please see instructions for details which were reviewed in writing and the patient given a copy highlighting the part that I personally wrote and discussed at today's ov.

## 2014-10-10 NOTE — Patient Instructions (Addendum)
Stop lotrel permanently   Plan A = automatic = symbicort 160 Take 2 puffs first thing in am and then another 2 puffs about 12 hours later.   Plan B = Backup - Only use your duoneb  as a rescue medication to be used if you can't catch your breath by resting or doing a relaxed purse lip breathing pattern.  - The less you use it, the better it will work when you need it. - Ok to use up to  every 4 hours if you must but call for immediate appointment if use goes up over your usual need  Work on inhaler technique:  relax and gently blow all the way out then take a nice smooth deep breath back in, triggering the inhaler at same time you start breathing in.  Hold for up to 5 seconds if you can. Blow out thru nose. Rinse and gargle with water when done      Pantoprazole (protonix) 40 mg   Take  30-60 min before first meal of the day and Pepcid (famotidine)  20 mg one @  bedtime until return to office - this is the best way to tell whether stomach acid is contributing to your problem.    GERD (REFLUX)  is an extremely common cause of respiratory symptoms just like yours , many times with no obvious heartburn at all.    It can be treated with medication, but also with lifestyle changes including elevation of the head of your bed (ideally with 6 inch  bed blocks),  Smoking cessation, avoidance of late meals, excessive alcohol, and avoid fatty foods, chocolate, peppermint, colas, red wine, and acidic juices such as orange juice.  NO MINT OR MENTHOL PRODUCTS SO NO COUGH DROPS  USE SUGARLESS CANDY INSTEAD (Jolley ranchers or Stover's or Life Savers) or even ice chips will also do - the key is to swallow to prevent all throat clearing. NO OIL BASED VITAMINS - use powdered substitutes.  Please schedule a follow up office visit in 2 weeks, sooner if needed

## 2014-10-28 ENCOUNTER — Ambulatory Visit (INDEPENDENT_AMBULATORY_CARE_PROVIDER_SITE_OTHER): Payer: Medicare Other | Admitting: Internal Medicine

## 2014-10-28 ENCOUNTER — Encounter: Payer: Self-pay | Admitting: Internal Medicine

## 2014-10-28 VITALS — BP 136/70 | HR 92 | Ht 62.5 in | Wt 160.0 lb

## 2014-10-28 DIAGNOSIS — J9611 Chronic respiratory failure with hypoxia: Secondary | ICD-10-CM | POA: Diagnosis not present

## 2014-10-28 DIAGNOSIS — Z23 Encounter for immunization: Secondary | ICD-10-CM | POA: Diagnosis not present

## 2014-10-28 DIAGNOSIS — J328 Other chronic sinusitis: Secondary | ICD-10-CM

## 2014-10-28 DIAGNOSIS — J449 Chronic obstructive pulmonary disease, unspecified: Secondary | ICD-10-CM

## 2014-10-28 DIAGNOSIS — I10 Essential (primary) hypertension: Secondary | ICD-10-CM

## 2014-10-28 NOTE — Progress Notes (Signed)
Subjective:     Patient ID: Suzanne Dickson, female   DOB: [birth year]     MRN: [id number]    Brief patient profile:  69 yobf quit smoking on admit    Admit date: 09/02/2014 Discharge date: 09/04/2014  Recommendations for Outpatient Follow-up:  1. Recommend pulmonary rehabilitation as an outpatient. Case manager provided the patient with information regarding this. 2. Home oxygen, 2 L to use with exertion 3. Prednisone and antibiotics given along with duonebs and ICS/LABA 4. F/u with PCP in 1-2 weeks and she should follow up with Pulmonology within 1 month also.  Discharge Diagnoses:  Principal Problem:  Obstructive chronic bronchitis with exacerbation Active Problems:  TOBACCO USER  Essential hypertension  RHINOSINUSITIS, CHRONIC  Chronic bronchitis  Acute bronchitis   Discharge Condition: Stable, improved  Diet recommendation: Diabetic diet  Wt Readings from Last 3 Encounters:  09/04/14 72.96 kg (160 lb 13.6 oz)  07/30/09 69.854 kg (154 lb)  06/04/09 69.4 kg (153 lb)    History of present illness:  The patient is a 69 year old female with history of hypertension, bronchitis, chronic sinusitis, 40-pack-year smoking history who presented to the emergency department with shortness of breath and productive cough for 3 weeks prior to admission. Her cough became productive of thick yellow sputum, and her albuterol nebulizer was no longer offering her relief at home. In the emergency department, she had oxygen saturations in the low to mid 80s on room air was started on 2 L oxygen by nasal cannula. She was given IV steroids and DuoNeb's and was admitted for COPD exacerbation.  Hospital Course:   Acute hypoxic respiratory failure secondary to acute COPD exacerbation. She was started on Solu-Medrol, DuoNeb's, 2 L nasal cannula, and levofloxacin. She had rapid improvement of her subjective symptoms. She transitioned to room air on 8/9, but continued to have some  difficulty breathing with exertion. On the date of discharge, she had normal oxygen saturations at rest on room air but when she exerted herself her oxygen saturations dropped to the mid 80s. She was prescribed 2 L home O2 to use with exertion. She should continue prednisone on a longer taper because she is still having some wheezing on exam. She should complete a 5 day course of levofloxacin. She already has a nebulizer machine at home and was prescribed duo nebs for home. She was started on inhaled cortical steroids and long-acting beta agonist.  Essential hypertension, blood pressures were low normal. Her home blood pressure medications were held. She was advised to follow-up with her primary care doctor in a proximally 1 week for repeat blood pressure check. Her blood pressure medications may need to be resumed at that time.  Tobacco abuse with 40-pack-year history. Advised to quit smoking and provided a nicotine patch.  Mild steroid-induced hyperglycemia. Hemoglobin A1c is pending. Her steroids were tapered and I do not think that she will need any treatment for this at home.  Hyperkalemia was spurious. Her EKG demonstrated no evidence of peaked T waves and repeat BMP was back to normal without intervention.  Consultants:  None      10/10/2014 1st [facility] office visit/ Osbaldo Mark  / on ACEi since at least 2011 per chart review Chief Complaint  Patient presents with  . Pulmonary Consult    Self referral. Pt c/o increased SOB for the past month, worse x 1 wk. She gets out of breath walking to her mailbox and was out of breath walking from lobby to exam room today.  first aware of a resp problem in her 50/s = freq sinus infections and occ bad chest cold with flares but ok breathing between flares / saba just during flares but march 2016 downhill with short of breath with adls  And daily mostly dry cough eval by Dr Montez Morita and Dr Radene Journey but notes not available and then abruptly worse  prior to above admit and again worse now that off prednisone rec Stop lotrel permanently  Plan A = automatic = symbicort 160 Take 2 puffs first thing in am and then another 2 puffs about 12 hours later.  Plan B = Backup - Only use your duoneb  as a rescue medication  Work on inhaler technique:  Pantoprazole (protonix) 40 mg   Take  30-60 min before first meal of the day and Pepcid (famotidine)  20 mg one @  bedtime until return to office Please schedule a follow up office visit in 2 weeks, sooner if needed    10/28/2014  f/u ov/Alisen Marsiglia re: ? Copd / very poor hfa but doing great  Chief Complaint  Patient presents with  . Follow-up    Pt states that her breathing is much improved since her last visit. She has not needed neb at all.  No new co's today.    Not limited by breathing from desired activities  But very sedentary / some nasal congestion    No obvious day to day or daytime variability or assoc   cp or chest tightness, subjective wheeze or  hb symptoms. No unusual exp hx or h/o childhood pna/ asthma or knowledge of premature birth.  Sleeping ok without nocturnal  or early am exacerbation  of respiratory  c/o's or need for noct saba. Also denies any obvious fluctuation of symptoms with weather or environmental changes or other aggravating or alleviating factors except as outlined above   Current Medications, Allergies, Complete Past Medical History, Past Surgical History, Family History, and Social History were reviewed in [employer] record.  ROS  The following are not active complaints unless bolded sore throat, dysphagia, dental problems, itching, sneezing,  nasal congestion or excess/ purulent secretions, ear ache,   fever, chills, sweats, unintended wt loss, classically pleuritic or exertional cp, hemoptysis,  orthopnea pnd or leg swelling, presyncope, palpitations, abdominal pain, anorexia, nausea, vomiting, diarrhea  or change in bowel or bladder habits, change  in stools or urine, dysuria,hematuria,  rash, arthralgias, visual complaints, headache, numbness, weakness or ataxia or problems with walking or coordination,  change in mood/affect or memory.           Objective:   Physical Exam  amb bm nad  / still some harsh coughing but no pseudowheeze    10/28/2014        160  Wt Readings from Last 3 Encounters:  10/10/14 160 lb (72.576 kg)  09/16/14 156 lb (70.761 kg)  09/04/14 160 lb 13.6 oz (72.96 kg)    Vital signs reviewed    HEENT: nl dentition, turbinates, and orophanx. Nl external ear canals without cough reflex   NECK :  without JVD/Nodes/TM/ nl carotid upstrokes bilaterally   LUNGS: no acc muscle use, distant bilat bs bilaterally no wheeze     CV:  RRR  no s3 or murmur or increase in P2, no edema   ABD:  soft and nontender with nl excursion in the supine position. No bruits or organomegaly, bowel sounds nl  MS:  warm without deformities, calf tenderness, cyanosis or clubbing  SKIN: warm and dry without lesions    NEURO:  alert, approp, no deficits    I personally reviewed images and agree with radiology impression as follows:  CXR:  09/02/14 No active cardiopulmonary disease. Mild hyperinflation.      Assessment:

## 2014-10-28 NOTE — Patient Instructions (Addendum)
Work on inhaler technique:  relax and gently blow all the way out then take a nice smooth deep breath back in, triggering the inhaler at same time you start breathing in.  Hold for up to 5 seconds if you can. Blow out thru nose. Rinse and gargle with water when done  Please see patient coordinator before you leave today  to schedule sinus CT   Keep on 02 for now 2lpm 24/7   Please schedule a follow up office visit in 4 weeks, sooner if needed with pfts on return

## 2014-10-31 ENCOUNTER — Ambulatory Visit (INDEPENDENT_AMBULATORY_CARE_PROVIDER_SITE_OTHER)
Admission: RE | Admit: 2014-10-31 | Discharge: 2014-10-31 | Disposition: A | Discharge status: Home / Self Care | Payer: Medicare Other | Source: Ambulatory Visit | Attending: Internal Medicine | Admitting: Internal Medicine

## 2014-10-31 DIAGNOSIS — J328 Other chronic sinusitis: Secondary | ICD-10-CM | POA: Diagnosis not present

## 2014-10-31 NOTE — Progress Notes (Signed)
Quick Note:  Spoke with pt and notified of results per Dr. Wert. Pt verbalized understanding and denied any questions.  ______ 

## 2014-11-03 NOTE — Assessment & Plan Note (Signed)
Adequate control on present rx, reviewed > no change in rx needed  > no need to rechallenge with acei

## 2014-11-03 NOTE — Assessment & Plan Note (Signed)
Started on 02 2lpm at d/c 09/04/14  - 10/28/2014   Walked RA x one lap @ 185 stopped due to  desat to 82%  at nl pace so rec 02 2lpm with walking and sleeping

## 2014-11-03 NOTE — Assessment & Plan Note (Signed)
Sinus CT 10/31/2014>  No sinusitis is seen.    rx rx otcs for now

## 2014-11-03 NOTE — Assessment & Plan Note (Addendum)
The proper method of use, as well as anticipated side effects, of a metered-dose inhaler are discussed and demonstrated to the patient. Improved effectiveness after extensive coaching during this visit to a level of approximately  25%   Despite very poor hfa she is much better since she quit smokin g    I reviewed the Fletcher curve with the patient that basically indicates  if you quit smoking when your best day FEV1 is still well preserved (as is may well prove to be  the case here)  it is highly unlikely you will progress to severe disease and informed the patient there was no medication on the market that has proven to alter the curve/ its downward trajectory  or the likelihood of progression of their disease.  Therefore stopping smoking and maintaining abstinence is the most important aspect of her care, not choice of inhalers or for that matter, doctors.  I had an extended discussion with the patient reviewing all relevant studies completed to date and  lasting 15 to 20 minutes of a 25 minute visit    Each maintenance medication was reviewed in detail including most importantly the difference between maintenance and prns and under what circumstances the prns are to be triggered using an action plan format that is not reflected in the computer generated alphabetically organized AVS.    Please see instructions for details which were reviewed in writing and the patient given a copy highlighting the part that I personally wrote and discussed at today's ov.

## 2014-11-04 ENCOUNTER — Telehealth: Payer: Self-pay | Admitting: Internal Medicine

## 2014-11-04 DIAGNOSIS — J9611 Chronic respiratory failure with hypoxia: Secondary | ICD-10-CM

## 2014-11-04 DIAGNOSIS — J449 Chronic obstructive pulmonary disease, unspecified: Secondary | ICD-10-CM

## 2014-11-04 NOTE — Telephone Encounter (Signed)
Fine with me

## 2014-11-04 NOTE — Telephone Encounter (Signed)
Spoke with Molly at [employer], states that pt needs an order for pulm rehab.  Suzanne Dickson states that she has been following pt but hasn't received an order for pulm rehab for the pt.   MW are you ok with ordering pulm rehab for this patient?  Thanks!

## 2014-11-04 NOTE — Telephone Encounter (Signed)
Order placed for pulmonary rehab.  LM for Molly at pulm rehab to advise that order was placed.

## 2014-11-25 NOTE — Progress Notes (Signed)
Patient ID: Suzanne Dickson, female   DOB: [birth year], 69 y.o.   MRN: [id number] NO show, not seen

## 2014-12-09 ENCOUNTER — Ambulatory Visit (INDEPENDENT_AMBULATORY_CARE_PROVIDER_SITE_OTHER): Payer: Medicare Other | Admitting: Internal Medicine

## 2014-12-09 ENCOUNTER — Encounter: Payer: Self-pay | Admitting: Internal Medicine

## 2014-12-09 VITALS — BP 128/60 | HR 85 | Ht 62.0 in | Wt 161.0 lb

## 2014-12-09 DIAGNOSIS — J449 Chronic obstructive pulmonary disease, unspecified: Secondary | ICD-10-CM

## 2014-12-09 DIAGNOSIS — J9611 Chronic respiratory failure with hypoxia: Secondary | ICD-10-CM

## 2014-12-09 DIAGNOSIS — I10 Essential (primary) hypertension: Secondary | ICD-10-CM | POA: Diagnosis not present

## 2014-12-09 LAB — PULMONARY FUNCTION TEST
DL/VA % pred: 48 %
DL/VA: 2.21 ml/min/mmHg/L
DLCO UNC % PRED: 24 %
DLCO UNC: 5.18 ml/min/mmHg
FEF 25-75 PRE: 0.35 L/s
FEF 25-75 Post: 0.32 L/sec
FEF2575-%Change-Post: -9 %
FEF2575-%PRED-PRE: 21 %
FEF2575-%Pred-Post: 19 %
FEV1-%Change-Post: -6 %
FEV1-%PRED-POST: 44 %
FEV1-%Pred-Pre: 47 %
FEV1-Post: 0.75 L
FEV1-Pre: 0.8 L
FEV1FVC-%Change-Post: 0 %
FEV1FVC-%Pred-Pre: 79 %
FEV6-%CHANGE-POST: -5 %
FEV6-%PRED-POST: 57 %
FEV6-%PRED-PRE: 60 %
FEV6-PRE: 1.27 L
FEV6-Post: 1.2 L
FEV6FVC-%CHANGE-POST: 1 %
FEV6FVC-%PRED-POST: 104 %
FEV6FVC-%PRED-PRE: 102 %
FVC-%CHANGE-POST: -7 %
FVC-%Pred-Post: 55 %
FVC-%Pred-Pre: 59 %
FVC-Post: 1.2 L
FVC-Pre: 1.29 L
POST FEV6/FVC RATIO: 100 %
PRE FEV1/FVC RATIO: 62 %
Post FEV1/FVC ratio: 62 %
Pre FEV6/FVC Ratio: 99 %
RV % PRED: 158 %
RV: 3.29 L
TLC % PRED: 101 %
TLC: 4.8 L

## 2014-12-09 NOTE — Progress Notes (Signed)
PFT done today. 

## 2014-12-09 NOTE — Patient Instructions (Addendum)
Please see patient coordinator before you leave today  to schedule evaluation for POC and refer back to rehab (referral already made)  Work on inhaler technique:  relax and gently blow all the way out then take a nice smooth deep breath back in, triggering the inhaler at same time you start breathing in.  Hold for up to 5 seconds if you can. Blow out thru nose. Rinse and gargle with water when done    Please schedule a follow up visit in 3 months but call sooner if needed

## 2014-12-09 NOTE — Progress Notes (Signed)
Subjective:     Patient ID: Suzanne Dickson, female   DOB: 11/24/1945     MRN: [id number]    Brief patient profile:  85 yobf quit smoking on admit    Admit date: 09/02/2014 Discharge date: 09/04/2014  Recommendations for Outpatient Follow-up:  1. Recommend pulmonary rehabilitation as an outpatient. Case manager provided the patient with information regarding this. 2. Home oxygen, 2 L to use with exertion 3. Prednisone and antibiotics given along with duonebs and ICS/LABA 4. F/u with PCP in 1-2 weeks and she should follow up with Pulmonology within 1 month also.  Discharge Diagnoses:  Principal Problem:  Obstructive chronic bronchitis with exacerbation Active Problems:  TOBACCO USER  Essential hypertension  RHINOSINUSITIS, CHRONIC  Chronic bronchitis  Acute bronchitis   Discharge Condition: Stable, improved  Diet recommendation: Diabetic diet  Wt Readings from Last 3 Encounters:  09/04/14 72.96 kg (160 lb 13.6 oz)  07/30/09 69.854 kg (154 lb)  06/04/09 69.4 kg (153 lb)    History of present illness:  The patient is a 69 year old female with history of hypertension, bronchitis, chronic sinusitis, 40-pack-year smoking history who presented to the emergency department with shortness of breath and productive cough for 3 weeks prior to admission. Her cough became productive of thick yellow sputum, and her albuterol nebulizer was no longer offering her relief at home. In the emergency department, she had oxygen saturations in the low to mid 80s on room air was started on 2 L oxygen by nasal cannula. She was given IV steroids and DuoNeb's and was admitted for COPD exacerbation.  Hospital Course:   Acute hypoxic respiratory failure secondary to acute COPD exacerbation. She was started on Solu-Medrol, DuoNeb's, 2 L nasal cannula, and levofloxacin. She had rapid improvement of her subjective symptoms. She transitioned to room air on 8/9, but continued to have some  difficulty breathing with exertion. On the date of discharge, she had normal oxygen saturations at rest on room air but when she exerted herself her oxygen saturations dropped to the mid 80s. She was prescribed 2 L home O2 to use with exertion. She should continue prednisone on a longer taper because she is still having some wheezing on exam. She should complete a 5 day course of levofloxacin. She already has a nebulizer machine at home and was prescribed duo nebs for home. She was started on inhaled cortical steroids and long-acting beta agonist.  Essential hypertension, blood pressures were low normal. Her home blood pressure medications were held. She was advised to follow-up with her primary care doctor in a proximally 1 week for repeat blood pressure check. Her blood pressure medications may need to be resumed at that time.  Tobacco abuse with 40-pack-year history. Advised to quit smoking and provided a nicotine patch.  Mild steroid-induced hyperglycemia. Hemoglobin A1c is pending. Her steroids were tapered and I do not think that she will need any treatment for this at home.  Hyperkalemia was spurious. Her EKG demonstrated no evidence of peaked T waves and repeat BMP was back to normal without intervention.  Consultants:  None      10/10/2014 1st [facility] office visit/ Chelle Cayton  / on ACEi since at least 2011 per chart review Chief Complaint  Patient presents with  . Pulmonary Consult    Self referral. Pt c/o increased SOB for the past month, worse x 1 wk. She gets out of breath walking to her mailbox and was out of breath walking from lobby to exam room today.  first aware of a resp problem in her 50/s = freq sinus infections and occ bad chest cold with flares but ok breathing between flares / saba just during flares but march 2016 downhill with short of breath with adls  And daily mostly dry cough eval by Dr Montez Morita and Dr Radene Journey but notes not available and then abruptly worse  prior to above admit and again worse now that off prednisone rec Stop lotrel permanently  Plan A = automatic = symbicort 160 Take 2 puffs first thing in am and then another 2 puffs about 12 hours later.  Plan B = Backup - Only use your duoneb  as a rescue medication  Work on inhaler technique:  Pantoprazole (protonix) 40 mg   Take  30-60 min before first meal of the day and Pepcid (famotidine)  20 mg one @  bedtime until return to office Please schedule a follow up office visit in 2 weeks, sooner if needed    10/28/2014  f/u ov/Naiyana Barbian re: ? Copd / very poor hfa but doing great  Chief Complaint  Patient presents with  . Follow-up    Pt states that her breathing is much improved since her last visit. She has not needed neb at all.  No new co's today.    Not limited by breathing from desired activities  But very sedentary / some nasal congestion  rec Work on inhaler technique:  r  Please see patient coordinator before you leave today  to schedule sinus CT >No  sinusitis is seen.Keep on 02 for now 2lpm 24/7     12/09/2014  f/u ov/Chevi Lim re: GOLD III copd / 02 dep with exertion / maint rx symbicort though hfa poor  Chief Complaint  Patient presents with  . Follow-up    Pt followig for COPD with a PFT: pt states she is well her breathing has been pretty good, her problem is stabalizing her oxygen. pt c/o prod cough here and there and sometimes wheezing at night. pt using continuous O2 2LPM. DME: [facility].   Mailbox and back and end of road and back  all without 02  Albeit slow pace  Still using duoneb up to 4 x daily    No obvious day to day or daytime variability or assoc excess or purulent sputum or  cp or chest tightness,  or  hb symptoms. No unusual exp hx or h/o childhood pna/ asthma or knowledge of premature birth.   . Also denies any obvious fluctuation of symptoms with weather or environmental changes or other aggravating or alleviating factors except as outlined above   Current  Medications, Allergies, Complete Past Medical History, Past Surgical History, Family History, and Social History were reviewed in [employer] record.  ROS  The following are not active complaints unless bolded sore throat, dysphagia, dental problems, itching, sneezing,  nasal congestion or excess/ purulent secretions, ear ache,   fever, chills, sweats, unintended wt loss, classically pleuritic or exertional cp, hemoptysis,  orthopnea pnd or leg swelling, presyncope, palpitations, abdominal pain, anorexia, nausea, vomiting, diarrhea  or change in bowel or bladder habits, change in stools or urine, dysuria,hematuria,  rash, arthralgias, visual complaints, headache, numbness, weakness or ataxia or problems with walking or coordination,  change in mood/affect or memory.           Objective:   Physical Exam  amb bm nad     10/28/2014        160 > 12/09/2014 161  Wt  Readings from Last 3 Encounters:  10/10/14 160 lb (72.576 kg)  09/16/14 156 lb (70.761 kg)  09/04/14 160 lb 13.6 oz (72.96 kg)    Vital signs reviewed    HEENT: nl dentition, turbinates, and orophanx. Nl external ear canals without cough reflex   NECK :  without JVD/Nodes/TM/ nl carotid upstrokes bilaterally   LUNGS: no acc muscle use, distant bilat bs bilaterally no wheeze     CV:  RRR  no s3 or murmur or increase in P2, no edema   ABD:  soft and nontender with nl excursion in the supine position. No bruits or organomegaly, bowel sounds nl  MS:  warm without deformities, calf tenderness, cyanosis or clubbing  SKIN: warm and dry without lesions    NEURO:  alert, approp, no deficits    I personally reviewed images and agree with radiology impression as follows:  CXR:  09/02/14 No active cardiopulmonary disease. Mild hyperinflation.      Assessment:

## 2014-12-11 ENCOUNTER — Encounter: Payer: Self-pay | Admitting: Internal Medicine

## 2014-12-11 NOTE — Assessment & Plan Note (Addendum)
DDX of  difficult airways management all start with A and  include Adherence, Ace Inhibitors, Acid Reflux, Active Sinus Disease, Alpha 1 Antitripsin deficiency, Anxiety masquerading as Airways dz,  ABPA,  allergy(esp in young), Aspiration (esp in elderly), Adverse effects of meds,  Active smokers, A bunch of PE's (a small clot burden can't cause this syndrome unless there is already severe underlying pulm or vascular dz with poor reserve) plus two Bs  = Bronchiectasis and Beta blocker use..and one C= CHF  Adherence is always the initial "prime suspect" and is a multilayered concern that requires a "trust but verify" approach in every patient - starting with knowing how to use medications, especially inhalers, correctly, keeping up with refills and understanding the fundamental difference between maintenance and prns vs those medications only taken for a very short course and then stopped and not refilled.  - The proper method of use, as well as anticipated side effects, of a metered-dose inhaler are discussed and demonstrated to the patient. Improved effectiveness after extensive coaching during this visit to a level of approximately  75% from a baseline of 25%    ? Active smoking > says doing well with abstinence, reinforced   I had an extended discussion with the patient reviewing all relevant studies completed to date and  lasting 15 to 20 minutes of a 25 minute visit  Discussed in detail all the  indications, usual  risks and alternatives  relative to the benefits with patient who agrees to proceed with rehab referral      Each maintenance medication was reviewed in detail including most importantly the difference between maintenance and prns and under what circumstances the prns are to be triggered using an action plan format that is not reflected in the computer generated alphabetically organized AVS.    Please see instructions for details which were reviewed in writing and the patient given a  copy highlighting the part that I personally wrote and discussed at today's ov.

## 2014-12-11 NOTE — Assessment & Plan Note (Signed)
D/c acei 10/10/2014  > improved off acei   Adequate control on present rx, reviewed > no change in rx needed  = norvasc/hctz

## 2014-12-11 NOTE — Assessment & Plan Note (Signed)
Started on 02 2lpm at d/c 09/04/14  - 10/28/2014   Walked RA x one lap @ 185 stopped due to  desat to 82%  at nl pace so rec 02 2lpm with walking and sleeping  - 12/09/2014 referred for POC   Emphasized def needs to wear 02 with activity, ok to leave off at rest

## 2014-12-17 ENCOUNTER — Telehealth: Payer: Self-pay | Admitting: Internal Medicine

## 2014-12-17 NOTE — Telephone Encounter (Signed)
Spoke with pt. Advised her that pulmonary rehab would call her about her being set up. Also advised her that her order for POC was received by [facility] >> we received confirmation. She will call them and see what the problem is. Nothing further was needed.

## 2014-12-23 ENCOUNTER — Telehealth (HOSPITAL_COMMUNITY): Payer: Self-pay

## 2014-12-23 NOTE — Telephone Encounter (Signed)
I have called and left a message with Anab's husband for Sallyann to call back to discuss participation in Pulmonary Rehab per Dr. Gustavus Bryant referral. Will follow up.

## 2015-01-03 ENCOUNTER — Encounter (HOSPITAL_COMMUNITY)
Admission: RE | Admit: 2015-01-03 | Discharge: 2015-01-03 | Disposition: A | Discharge status: Home / Self Care | Payer: Medicare Other | Source: Ambulatory Visit | Attending: Internal Medicine | Admitting: Internal Medicine

## 2015-01-03 ENCOUNTER — Encounter (HOSPITAL_COMMUNITY): Payer: Self-pay

## 2015-01-03 VITALS — BP 155/62 | HR 84 | Resp 18 | Ht 62.25 in | Wt 162.3 lb

## 2015-01-03 DIAGNOSIS — J449 Chronic obstructive pulmonary disease, unspecified: Secondary | ICD-10-CM | POA: Insufficient documentation

## 2015-01-03 NOTE — Progress Notes (Signed)
Suzanne Dickson 69 y.o. female Pulmonary Rehab Orientation Note [id number] Patient arrived today in [facility] for orientation to Pulmonary Rehab. She was transported from [employer] via wheel chair. She does carry portable oxygen for exertional activities. She also uses oxygen at 2 liters during sleep and during the day in her home with exertional ADLs. She does not wear her oxygen when she cooks because of having a gas stove. Her husband cooks most meals but she is able to cook with the oven and crockpot. Color good, skin warm and dry. Patient is oriented to time and place. Patient's medical history, psychosocial health, and medications reviewed. Psychosocial assessment reveals pt lives with their spouse. Their 15 year old son lives with them due to his mental handicap. He is able to complete all ADLs and only relies on his parents for transportation and housing/meals. Jaemarie also has 2 other children and multiple grandchildren that are active in her life. 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 Pt hobbies include shopping, but she does not enjoy this any longer. Her most favorite thing to shop for would be seasonal/holiday clothing but since she does not go to bible study or church as she used to, she has no place to wear them. She says she is physically able to shop and go to church, but dragging her oxygen around is a chore because of its weight. She is in the process of getting a portable concentrator. She feels this will significantly improve her quality of life. She also enjoys playing games on her computer and phone. Pt reports her stress level is low. Areas of stress/anxiety include Health but she frequently states she knows she is blessed compared to others.  Pt does exhibits some signs of depression. Signs of depression include having a lack of interest in doing social activities such as shopping. PHQ2/9 score 3/4. She is currently taking a low dose  antidepressant and has been encouraged to speak with her physician about lingering symptoms of depression. Pt shows good  coping skills with positive outlook . She was offered emotional support and reassurance. Will continue to monitor and evaluate progress toward psychosocial goal(s) of improved management of depression. Physical assessment reveals heart rate is normal, S1S2 present. Breath sounds clear to auscultation, no wheezes, rales, or rhonchi. Grip strength equal, strong. Distal pulses palpable. No edema noted. Patient reports she does take medications as prescribed. Patient states she follows a Regular diet. The patient reports no specific efforts to gain or lose weight.. Patient's weight will be monitored closely. Demonstration and practice of PLB using pulse oximeter. Patient able to return demonstration satisfactorily. Safety and hand hygiene in the exercise area reviewed with patient. Patient voices understanding of the information reviewed. Department expectations discussed with patient and achievable goals were set. The patient shows enthusiasm about attending the program and we look forward to working with this nice lady. The patient is scheduled for a 6 min walk test on Tuesday 12/13 at 3:30 and to begin exercise on Thursday 12/15 in the 1:30 class.   45 minutes was spent on a variety of activities such as assessment of the patient, obtaining baseline data including height, weight, BMI, and grip strength, verifying medical history, allergies, and current medications, and teaching patient strategies for performing tasks with less respiratory effort with emphasis on pursed lip breathing.

## 2015-01-05 ENCOUNTER — Other Ambulatory Visit: Payer: Self-pay | Admitting: Internal Medicine

## 2015-01-07 ENCOUNTER — Encounter (HOSPITAL_COMMUNITY)
Admission: RE | Admit: 2015-01-07 | Discharge: 2015-01-07 | Disposition: A | Discharge status: Home / Self Care | Payer: Medicare Other | Source: Ambulatory Visit | Attending: Internal Medicine | Admitting: Internal Medicine

## 2015-01-07 NOTE — Progress Notes (Signed)
Alawna completed a Six-Minute Walk Test on 01/07/15 . Merridith walked 1244 feet with 0 breaks.  The patient's lowest oxygen saturation was 85 %, highest heart rate was 137 bpm , and highest blood pressure was 178/68. The patient was on 2 liters of oxygen with a nasal cannula to begin with but then titrated to 4 liters to maintain oxygen saturation at 88%. Patient stated that nothing hindered their walk test.

## 2015-01-09 ENCOUNTER — Encounter (HOSPITAL_COMMUNITY)
Admission: RE | Admit: 2015-01-09 | Discharge: 2015-01-09 | Disposition: A | Discharge status: Home / Self Care | Payer: Medicare Other | Source: Ambulatory Visit | Attending: Internal Medicine | Admitting: Internal Medicine

## 2015-01-09 DIAGNOSIS — J449 Chronic obstructive pulmonary disease, unspecified: Secondary | ICD-10-CM | POA: Diagnosis not present

## 2015-01-09 NOTE — Progress Notes (Signed)
Pt completed Quality of Life survey as a participant in Pulmonary Rehab. Scores 21.0 or below are considered low. Pt scored high in several areas Overall 22.50, Health and Function 21.43, socioeconomic 26.44, family 23.88, psychological 19.50. I reviewed her survey with her.

## 2015-01-09 NOTE — Progress Notes (Signed)
Today, Alliyah exercised at [facility]. Service time was from 1330 to 1530.  The patient exercised by performing aerobic, strengthening, and stretching exercises. Oxygen saturation, heart rate, blood pressure, rate of perceived exertion, and shortness of breath were all monitored before, during, and after exercise. Erionna presented with no problems at today's exercise session. Glendale also attended an education session on advanced directives with Jeanella Craze.  The patient did not have an increase in workload intensity during today's exercise session.  Pre-exercise vitals: . Weight kg: 72.9 . Liters of O2: 2L . SpO2: 90 . HR: 87 . BP: 116/64 . CBG: na  Exercise vitals: . Highest heartrate:  93 . Lowest oxygen saturation: 91 . Highest blood pressure: 126/70 . Liters of 02: 2L  Post-exercise vitals: . SpO2: 94 . HR: 79 . BP: 120/60 . Liters of O2: 2L . CBG: na  Dr. Rush Farmer, Medical Director Dr. Frederic Jericho is immediately available during today's Pulmonary Rehab session for Amada G Bloyd on 01/09/2015 at 1330 class time.

## 2015-01-14 ENCOUNTER — Encounter (HOSPITAL_COMMUNITY)
Admission: RE | Admit: 2015-01-14 | Discharge: 2015-01-14 | Disposition: A | Discharge status: Home / Self Care | Payer: Medicare Other | Source: Ambulatory Visit | Attending: Internal Medicine | Admitting: Internal Medicine

## 2015-01-14 DIAGNOSIS — J449 Chronic obstructive pulmonary disease, unspecified: Secondary | ICD-10-CM | POA: Diagnosis not present

## 2015-01-14 NOTE — Progress Notes (Signed)
Today, Suzanne Dickson exercised at [facility]. Service time was from 1330 to 1505.  The patient exercised by performing aerobic, strengthening, and stretching exercises. Oxygen saturation, heart rate, blood pressure, rate of perceived exertion, and shortness of breath were all monitored before, during, and after exercise. Suzanne Dickson presented with no problems at today's exercise session.  The patient did not have an increase in workload intensity during today's exercise session.  Pre-exercise vitals: . Weight kg: 72.1 . Liters of O2: 2 . SpO2: 91 . HR: 80 . BP: 110/60 . CBG: NA  Exercise vitals: . Highest heartrate:  96 . Lowest oxygen saturation: 88 . Highest blood pressure: 140/70 . Liters of 02: 4  Post-exercise vitals: . SpO2: 97 . HR: 85 . BP: 100/60 . Liters of O2: 2 . CBG: NA Dr. Rush Farmer, Medical Director Dr. Marily Memos is immediately available during today's Pulmonary Rehab session for Suzanne Dickson on 01/14/2015  at 1330 class time  .

## 2015-01-16 ENCOUNTER — Encounter (HOSPITAL_COMMUNITY)
Admission: RE | Admit: 2015-01-16 | Discharge: 2015-01-16 | Disposition: A | Discharge status: Home / Self Care | Payer: Medicare Other | Source: Ambulatory Visit | Attending: Internal Medicine | Admitting: Internal Medicine

## 2015-01-16 DIAGNOSIS — J449 Chronic obstructive pulmonary disease, unspecified: Secondary | ICD-10-CM | POA: Diagnosis not present

## 2015-01-16 NOTE — Progress Notes (Signed)
Today, Suzanne Dickson exercised at [facility]. Service time was from 1:30pm to 3:30pm.  The patient exercised by performing aerobic, strengthening, and stretching exercises. Oxygen saturation, heart rate, blood pressure, rate of perceived exertion, and shortness of breath were all monitored before, during, and after exercise. Suzanne Dickson with no problems at today's exercise session. The patient attended education today with [facility] Suzanne Dickson on Pursed Lip and Diaphragmatic Breathing.  The patient did have an increase in workload intensity during today's exercise session.  Pre-exercise vitals: . Weight kg: 71.7 . Liters of O2: 2 . SpO2: 92 . HR: 80 . BP: 122/62 . CBG: na  Exercise vitals: . Highest heartrate:  89 . Lowest oxygen saturation: 90 . Highest blood pressure: 126/64 . Liters of 02: 4  Post-exercise vitals: . SpO2: 94 . HR: 83 . BP: 132/70 . Liters of O2: 4 . CBG: na  Dr. Rush Farmer, Medical Director Dr. Clementeen Graham is immediately available during today's Pulmonary Rehab session for Suzanne Dickson on 01/16/15 at 1:30pm class time.

## 2015-01-21 ENCOUNTER — Encounter (HOSPITAL_COMMUNITY): Payer: Medicare Other

## 2015-01-23 ENCOUNTER — Encounter (HOSPITAL_COMMUNITY): Payer: Medicare Other

## 2015-01-24 ENCOUNTER — Telehealth: Payer: Self-pay | Admitting: Internal Medicine

## 2015-01-24 MED ORDER — PREDNISONE 10 MG PO TABS: ORAL_TABLET | ORAL | Status: DC

## 2015-01-24 MED ORDER — AMOXICILLIN-POT CLAVULANATE 875-125 MG PO TABS: 1.0000 | ORAL_TABLET | Freq: Two times a day (BID) | ORAL | Status: DC

## 2015-01-24 NOTE — Telephone Encounter (Signed)
Per 12/09/14 OV: Patient Instructions       Please see patient coordinator before you leave today  to schedule evaluation for POC and refer back to rehab (referral already made) Work on inhaler technique:  relax and gently blow all the way out then take a nice smooth deep breath back in, triggering the inhaler at same time you start breathing in.  Hold for up to 5 seconds if you can. Blow out thru nose. Rinse and gargle with water when done Please schedule a follow up visit in 3 months but call sooner if needed  ---  Called spoke with pt. She c/o nasal cong, PND, sneezing, prod cough (dark cream colored phlem), blowing out same color from nose. Denies any f/c/s/n/v.  She is taking mucinex. Please advise MW thanks

## 2015-01-24 NOTE — Telephone Encounter (Signed)
Called spoke with pt. Aware of recs below. RX's sent in. Nothing further needed

## 2015-01-24 NOTE — Telephone Encounter (Signed)
Augmentin 875 mg take one pill twice daily  X 10 days - take at breakfast and supper with large glass of water.  It would help reduce the usual side effects (diarrhea and yeast infections) if you ate cultured yogurt at lunch.   Prednisone 10 mg take  4 each am x 2 days,   2 each am x 2 days,  1 each am x 2 days and stop  

## 2015-01-28 ENCOUNTER — Encounter (HOSPITAL_COMMUNITY)
Admission: RE | Admit: 2015-01-28 | Discharge: 2015-01-28 | Disposition: A | Discharge status: Home / Self Care | Payer: Medicare Other | Source: Ambulatory Visit | Attending: Internal Medicine | Admitting: Internal Medicine

## 2015-01-28 DIAGNOSIS — J449 Chronic obstructive pulmonary disease, unspecified: Secondary | ICD-10-CM | POA: Diagnosis present

## 2015-01-28 NOTE — Progress Notes (Signed)
Today, Suzanne Dickson exercised at [facility]. Service time was from 1330 to 1455.  The patient exercised by performing aerobic, strengthening, and stretching exercises. Oxygen saturation, heart rate, blood pressure, rate of perceived exertion, and shortness of breath were all monitored before, during, and after exercise. Suzanne Dickson presented with no problems at today's exercise session.  The patient did have an increase in workload intensity during today's exercise session.  Pre-exercise vitals: . Weight kg: 71.3 . Liters of O2: 2L . SpO2: 92 . HR: 85 . BP: 118/56 . CBG: na  Exercise vitals: . Highest heartrate:  120 . Lowest oxygen saturation: 93 . Highest blood pressure: 142/80 . Liters of 02: 4L  Post-exercise vitals: . SpO2: 94 . HR: 89 . BP: 106/74 . Liters of O2: 4L . CBG: na  Dr. Rush Farmer, Medical Director Dr. Marily Memos is immediately available during today's Pulmonary Rehab session for Suzanne Dickson on 01/28/2015 at 1330 class time

## 2015-01-28 NOTE — Progress Notes (Signed)
I have reviewed a Home Exercise Prescription with Suzanne Dickson . [name] is not currently exercising at home.  The patient was advised to walk 2-3 days a week for 30 minutes.  [name] and I discussed how to progress their exercise prescription.  The patient stated that their goals were to get off of her oxygen, increase energy, ad increase quality of life.  The patient stated that they understand the exercise prescription.  We reviewed exercise guidelines, target heart rate during exercise, oxygen use, weather, home pulse oximeter, endpoints for exercise, and goals.  Patient is encouraged to come to me with any questions. I will continue to follow up with the patient to assist them with progression and safety.

## 2015-01-30 ENCOUNTER — Encounter (HOSPITAL_COMMUNITY)
Admission: RE | Admit: 2015-01-30 | Discharge: 2015-01-30 | Disposition: A | Discharge status: Home / Self Care | Payer: Medicare Other | Source: Ambulatory Visit | Attending: Internal Medicine | Admitting: Internal Medicine

## 2015-01-30 DIAGNOSIS — J449 Chronic obstructive pulmonary disease, unspecified: Secondary | ICD-10-CM | POA: Diagnosis not present

## 2015-01-30 NOTE — Progress Notes (Signed)
Today, Jaley exercised at [facility]. Service time was from 1330 to 1515.  The patient exercised by performing aerobic, strengthening, and stretching exercises. Oxygen saturation, heart rate, blood pressure, rate of perceived exertion, and shortness of breath were all monitored before, during, and after exercise. Suzanne Dickson presented with no problems at today's exercise session. She attended risk factor reduction class today.  The patient did not have an increase in workload intensity during today's exercise session.  Pre-exercise vitals: . Weight kg: 70.9 . Liters of O2: 2 . SpO2: 91 . HR: 80 . BP: 110/56 . CBG: NA  Exercise vitals: . Highest heartrate:  118 . Lowest oxygen saturation: 91 . Highest blood pressure: 114/64 . Liters of 02: 4  Post-exercise vitals: . SpO2: 95 . HR: 84 . BP: 130/64 . Liters of O2: 4 . CBG: NA Dr. Rush Farmer, Medical Director Dr. Marily Memos is immediately available during today's Pulmonary Rehab session for Suzanne Dickson on 01/30/2015  at 1330 class time  .

## 2015-02-04 ENCOUNTER — Encounter (HOSPITAL_COMMUNITY): Payer: Medicare Other

## 2015-02-06 ENCOUNTER — Encounter (HOSPITAL_COMMUNITY)
Admission: RE | Admit: 2015-02-06 | Discharge: 2015-02-06 | Disposition: A | Discharge status: Home / Self Care | Payer: Medicare Other | Source: Ambulatory Visit | Attending: Internal Medicine | Admitting: Internal Medicine

## 2015-02-06 DIAGNOSIS — J449 Chronic obstructive pulmonary disease, unspecified: Secondary | ICD-10-CM | POA: Diagnosis not present

## 2015-02-06 NOTE — Progress Notes (Signed)
Today, Suzanne Dickson exercised at [facility]. Service time was from 1330 to 1500.  The patient exercised by performing aerobic, strengthening, and stretching exercises. Oxygen saturation, heart rate, blood pressure, rate of perceived exertion, and shortness of breath were all monitored before, during, and after exercise. [name] presented with no problems at today's exercise session.Patient attended the Stress Management class today.   The patient did not have an increase in workload intensity during today's exercise session.  Pre-exercise vitals: . Weight kg: 71.7 . Liters of O2: 2 . SpO2: 90 . HR: 86 . BP: 110/70 . CBG: na  Exercise vitals: . Highest heartrate:  117 . Lowest oxygen saturation: 88 . Highest blood pressure: 130/80 . Liters of 02: 4  Post-exercise vitals: . SpO2: 93 . HR: 91 . BP: 104/70 . Liters of O2: 4 . CBG: na Dr. Rush Farmer, Medical Director Dr. Waldron Labs is immediately available during today's Pulmonary Rehab session for [name] on 02/06/2015  at 1330 class time.  Marland Kitchen

## 2015-02-11 ENCOUNTER — Encounter (HOSPITAL_COMMUNITY)
Admission: RE | Admit: 2015-02-11 | Discharge: 2015-02-11 | Disposition: A | Discharge status: Home / Self Care | Payer: Medicare Other | Source: Ambulatory Visit | Attending: Internal Medicine | Admitting: Internal Medicine

## 2015-02-11 DIAGNOSIS — J449 Chronic obstructive pulmonary disease, unspecified: Secondary | ICD-10-CM | POA: Diagnosis not present

## 2015-02-11 NOTE — Progress Notes (Signed)
Today, Suzanne Dickson exercised at [facility]. Service time was from 1330 to 1500.  The patient exercised by performing aerobic, strengthening, and stretching exercises. Oxygen saturation, heart rate, blood pressure, rate of perceived exertion, and shortness of breath were all monitored before, during, and after exercise. Suzanne Dickson presented with no problems at today's exercise session.  The patient did  have an increase in workload intensity during today's exercise session.  Pre-exercise vitals: . Weight kg: 71.6 . Liters of O2: 2 . SpO2: 96 . HR: 87 . BP: 106/72 . CBG: NA  Exercise vitals: . Highest heartrate:  119 . Lowest oxygen saturation: 92 . Highest blood pressure: 142/74 . Liters of 02: 4  Post-exercise vitals: . SpO2: 96 . HR: 83 . BP: 112/70 . Liters of O2: 2 . CBG: NA Dr. Rush Farmer, Medical Director Dr. Marily Memos is immediately available during today's Pulmonary Rehab session for Suzanne Dickson on 02/11/2015  at 1330 class time  .

## 2015-02-11 NOTE — Progress Notes (Signed)
Suzanne Dickson 70 y.o. female Nutrition Note Spoke with pt. Pt is overweight and wants to lose wt. Pt c/o wt gain since she stopped smoking. Pt reports she has been tobacco free for 6 months. According to pt's Rate Your Plate results, there are some ways the pt can make her eating habits healthier. Areas for improvement in dietary choices reviewed. Pt currently does not avoid salty food; uses canned/ convenience food.  Pt adds salt to food. Pt states she has recently started to decreased the amount of processed food she consumes. The role of sodium in lung disease reviewed with pt. Pt is pre-diabetic according to her last A1c. 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 Pt reports she is now off of prednisone. Pt expressed understanding of the information reviewed. Lab Results  Component Value Date   HGBA1C 6.0* 09/04/2014   Nutrition Diagnosis ? Food-and nutrition-related knowledge deficit related to lack of exposure to information as related to diagnosis of pulmonary disease ? Overweight related to excessive energy intake as evidenced by a BMI of 29.5  Nutrition Intervention ? Pt's individual nutrition plan and goals reviewed with pt. ? Benefits of adopting healthy eating habits discussed when pt's Rate Your Plate reviewed. ? Pt to attend the Nutrition and Lung Disease class ? Continual client-centered nutrition education by RD, as part of interdisciplinary care. Goal(s) 1. Pt to identify and limit processed food 2. Identify food quantities necessary to achieve wt loss of  -2# per week to a goal wt loss of 2.7-10.9 kg (6-24 lb) at graduation from pulmonary rehab. Monitor and Evaluate progress toward nutrition goal with team.   Derek Mound, M.Ed, RD, LDN, CDE 02/11/2015 2:31 PM

## 2015-02-13 ENCOUNTER — Encounter (HOSPITAL_COMMUNITY)
Admission: RE | Admit: 2015-02-13 | Discharge: 2015-02-13 | Disposition: A | Discharge status: Home / Self Care | Payer: Medicare Other | Source: Ambulatory Visit | Attending: Internal Medicine | Admitting: Internal Medicine

## 2015-02-13 DIAGNOSIS — J449 Chronic obstructive pulmonary disease, unspecified: Secondary | ICD-10-CM | POA: Diagnosis not present

## 2015-02-13 NOTE — Progress Notes (Signed)
Today, Suzanne Dickson exercised at [facility]. Service time was from 1330 to 1525.  The patient exercised by performing aerobic, strengthening, and stretching exercises. Oxygen saturation, heart rate, blood pressure, rate of perceived exertion, and shortness of breath were all monitored before, during, and after exercise. Suzanne Dickson presented with no problems at today's exercise session. She attended pulmonary medication class today.  The patient did not have an increase in workload intensity during today's exercise session.  Pre-exercise vitals: . Weight kg: 71.4 . Liters of O2: 2 . SpO2: 90 . HR: 87 . BP: 110/62 . CBG: NA  Exercise vitals: . Highest heartrate:  119 . Lowest oxygen saturation: 90 . Highest blood pressure: 150/80 . Liters of 02: 4  Post-exercise vitals: . SpO2: 96 . HR: 93 . BP: 100/58 . Liters of O2: 2 . CBG: NA Dr. Rush Farmer, Medical Director Dr. Marily Memos is immediately available during today's Pulmonary Rehab session for Suzanne Dickson on 02/13/2015  at 1330 class time  .

## 2015-02-18 ENCOUNTER — Encounter (HOSPITAL_COMMUNITY)
Admission: RE | Admit: 2015-02-18 | Discharge: 2015-02-18 | Disposition: A | Discharge status: Home / Self Care | Payer: Medicare Other | Source: Ambulatory Visit | Attending: Internal Medicine | Admitting: Internal Medicine

## 2015-02-18 DIAGNOSIS — J449 Chronic obstructive pulmonary disease, unspecified: Secondary | ICD-10-CM | POA: Diagnosis not present

## 2015-02-18 NOTE — Progress Notes (Signed)
Today, Elaf exercised at [facility]. Service time was from 1330 to 1500.  The patient exercised by performing aerobic, strengthening, and stretching exercises. Oxygen saturation, heart rate, blood pressure, rate of perceived exertion, and shortness of breath were all monitored before, during, and after exercise. Lorraina presented with no problems at today's exercise session.  The patient did not have an increase in workload intensity during today's exercise session.  Pre-exercise vitals: . Weight kg: 71.8 . Liters of O2: 2 . SpO2: 92 . HR: 88 . BP: 124/64 . CBG: na  Exercise vitals: . Highest heartrate:  121 . Lowest oxygen saturation: 92 . Highest blood pressure: 138/78 . Liters of 02: 4  Post-exercise vitals: . SpO2: 95 . HR: 85 . BP: 124/70 . Liters of O2: 2 . CBG: na Dr. Rush Farmer, Medical Director Dr. Marily Memos is immediately available during today's Pulmonary Rehab session for Mayu G Kingbird on 02/18/2015  at 1330 class time  .

## 2015-02-20 ENCOUNTER — Encounter (HOSPITAL_COMMUNITY)
Admission: RE | Admit: 2015-02-20 | Discharge: 2015-02-20 | Disposition: A | Discharge status: Home / Self Care | Payer: Medicare Other | Source: Ambulatory Visit | Attending: Internal Medicine | Admitting: Internal Medicine

## 2015-02-20 DIAGNOSIS — J449 Chronic obstructive pulmonary disease, unspecified: Secondary | ICD-10-CM | POA: Diagnosis not present

## 2015-02-20 NOTE — Progress Notes (Signed)
Today, Suzanne Dickson exercised at [facility]. Service time was from 1330 to 1530.  The patient exercised by performing aerobic, strengthening, and stretching exercises. Oxygen saturation, heart rate, blood pressure, rate of perceived exertion, and shortness of breath were all monitored before, during, and after exercise. Suzanne Dickson presented with no problems at today's exercise session. She attended oxygen safety class.  The patient did not have an increase in workload intensity during today's exercise session.  Pre-exercise vitals: . Weight kg: 71.9 . Liters of O2: 2 . SpO2: 95 . HR: 88 . BP: 124/60 . CBG: NA  Exercise vitals: . Highest heartrate:  99 . Lowest oxygen saturation: 95 . Highest blood pressure: 126/72 . Liters of 02: 4  Post-exercise vitals: . SpO2: 96 . HR: 87 . BP: 110/60 . Liters of O2: 2 . CBG: NA Dr. Rush Dickson, Medical Director Dr. Sheran Dickson is immediately available during today's Pulmonary Rehab session for Suzanne Dickson on 02/20/2015  at 1330 class time.  Marland Kitchen

## 2015-02-25 ENCOUNTER — Encounter (HOSPITAL_COMMUNITY)
Admission: RE | Admit: 2015-02-25 | Discharge: 2015-02-25 | Disposition: A | Discharge status: Home / Self Care | Payer: Medicare Other | Source: Ambulatory Visit | Attending: Internal Medicine | Admitting: Internal Medicine

## 2015-02-25 DIAGNOSIS — J449 Chronic obstructive pulmonary disease, unspecified: Secondary | ICD-10-CM | POA: Diagnosis not present

## 2015-02-25 NOTE — Progress Notes (Signed)
Today, Zanovia exercised at [facility]. Service time was from 1330 to 1500.  The patient exercised by performing aerobic, strengthening, and stretching exercises. Oxygen saturation, heart rate, blood pressure, rate of perceived exertion, and shortness of breath were all monitored before, during, and after exercise. Nathifa presented with no problems at today's exercise session.  The patient did not have an increase in workload intensity during today's exercise session.  Pre-exercise vitals: . Weight kg: 72.2 . Liters of O2: 2 . SpO2: 92 . HR: 81 . BP: 110/60 . CBG: NA  Exercise vitals: . Highest heartrate:  117 . Lowest oxygen saturation: 91 . Highest blood pressure: 132/70 . Liters of 02: 4  Post-exercise vitals: . SpO2: 96 . HR: 82 . BP: 100/60 . Liters of O2: 2 . CBG: NA Dr. Rush Farmer, Medical Director Dr. Marily Memos is immediately available during today's Pulmonary Rehab session for Briar G Vinluan on 02/25/2015  at 1330 class time  .

## 2015-02-27 ENCOUNTER — Encounter (HOSPITAL_COMMUNITY)
Admission: RE | Admit: 2015-02-27 | Discharge: 2015-02-27 | Disposition: A | Discharge status: Home / Self Care | Payer: Medicare Other | Source: Ambulatory Visit | Attending: Internal Medicine | Admitting: Internal Medicine

## 2015-02-27 DIAGNOSIS — J449 Chronic obstructive pulmonary disease, unspecified: Secondary | ICD-10-CM | POA: Diagnosis not present

## 2015-02-27 NOTE — Progress Notes (Signed)
Today, Tamisha exercised at [facility]. Service time was from 1330 to 1515.  The patient exercised by performing aerobic, strengthening, and stretching exercises. Oxygen saturation, heart rate, blood pressure, rate of perceived exertion, and shortness of breath were all monitored before, during, and after exercise. Stpehanie presented with no problems at today's exercise session. Veneda also attended an education session with RT on pulmonary medications, pursed lip, and diaphragmatic breathing.  The patient did not have an increase in workload intensity during today's exercise session.  Pre-exercise vitals: . Weight kg: 72.2 . Liters of O2: 2L . SpO2: 93 . HR: 77 . BP: 124/60 . CBG: na  Exercise vitals: . Highest heartrate:  123 . Lowest oxygen saturation: 88 . Highest blood pressure: 150/80 . Liters of 02: 4L  Post-exercise vitals: . SpO2: 93 . HR: 82 . BP: 106/52 . Liters of O2: 4L . CBG: na  Dr. Rush Farmer, Medical Director Dr. Jerilee Hoh is immediately available during today's Pulmonary Rehab session for Lanett G Hillesheim on 02/27/2015 at 1330 class time.

## 2015-03-04 ENCOUNTER — Encounter (HOSPITAL_COMMUNITY)
Admission: RE | Admit: 2015-03-04 | Discharge: 2015-03-04 | Disposition: A | Discharge status: Home / Self Care | Payer: Medicare Other | Source: Ambulatory Visit | Attending: Internal Medicine | Admitting: Internal Medicine

## 2015-03-04 DIAGNOSIS — J449 Chronic obstructive pulmonary disease, unspecified: Secondary | ICD-10-CM | POA: Diagnosis not present

## 2015-03-04 NOTE — Progress Notes (Signed)
Today, Joshalyn exercised at [facility]. Service time was from 1330 to 1510.  The patient exercised by performing aerobic, strengthening, and stretching exercises. Oxygen saturation, heart rate, blood pressure, rate of perceived exertion, and shortness of breath were all monitored before, during, and after exercise. Vinita presented with no problems at today's exercise session.  The patient did not have an increase in workload intensity during today's exercise session.  Pre-exercise vitals: . Weight kg: 72.3 . Liters of O2: 2 . SpO2: 93 . HR: 80 . BP: 120/54 . CBG: na  Exercise vitals: . Highest heartrate:  111 . Lowest oxygen saturation: 93 . Highest blood pressure: 124/64 . Liters of 02: 4  Post-exercise vitals: . SpO2: 95 . HR: 88 . BP: 116/74 . Liters of O2: 2 . CBG: na Dr. Rush Farmer, Medical Director Dr. Marily Memos is immediately available during today's Pulmonary Rehab session for Willow G Pequignot on 03/04/2015  at 1330 class time  .

## 2015-03-06 ENCOUNTER — Encounter (HOSPITAL_COMMUNITY)
Admission: RE | Admit: 2015-03-06 | Discharge: 2015-03-06 | Disposition: A | Discharge status: Home / Self Care | Payer: Medicare Other | Source: Ambulatory Visit | Attending: Internal Medicine | Admitting: Internal Medicine

## 2015-03-06 DIAGNOSIS — J449 Chronic obstructive pulmonary disease, unspecified: Secondary | ICD-10-CM | POA: Diagnosis not present

## 2015-03-06 NOTE — Progress Notes (Signed)
Today, Zamyia exercised at [facility]. Service time was from 1330 to 1520.  The patient exercised by performing aerobic, strengthening, and stretching exercises. Oxygen saturation, heart rate, blood pressure, rate of perceived exertion, and shortness of breath were all monitored before, during, and after exercise. Adelisa presented with no problems at today's exercise session. Felise also attended an education with [facility].  The patient did not have an increase in workload intensity during today's exercise session.  Pre-exercise vitals: . Weight kg: 73.1 . Liters of O2: 2L . SpO2: 96 . HR: 86 . BP: 114/60 . CBG: na  Exercise vitals: . Highest heartrate:  95 . Lowest oxygen saturation: 94 . Highest blood pressure: 120/60 . Liters of 02: 4L  Post-exercise vitals: . SpO2: 97 . HR: 79 . BP: 126/64 . Liters of O2: 2L . CBG: na  Dr. Rush Farmer, Medical Director Dr. Waldron Labs is immediately available during today's Pulmonary Rehab session for Symphanie G Ridolfi on 03/06/2015 at 1330 class time.

## 2015-03-11 ENCOUNTER — Encounter (HOSPITAL_COMMUNITY)
Admission: RE | Admit: 2015-03-11 | Discharge: 2015-03-11 | Disposition: A | Discharge status: Home / Self Care | Payer: Medicare Other | Source: Ambulatory Visit | Attending: Internal Medicine | Admitting: Internal Medicine

## 2015-03-11 DIAGNOSIS — J449 Chronic obstructive pulmonary disease, unspecified: Secondary | ICD-10-CM | POA: Diagnosis not present

## 2015-03-11 NOTE — Progress Notes (Signed)
Today, Suzanne Dickson exercised at [facility]. Service time was from 1330 to 1510.  The patient exercised by performing aerobic, strengthening, and stretching exercises. Oxygen saturation, heart rate, blood pressure, rate of perceived exertion, and shortness of breath were all monitored before, during, and after exercise. Suzanne Dickson presented with no problems at today's exercise session.  The patient did  have an increase in workload intensity during today's exercise session.  Pre-exercise vitals: . Weight kg: 72.2 . Liters of O2: 2 . SpO2: 92 . HR: 72 . BP: 142/78 . CBG: na  Exercise vitals: . Highest heartrate:  120 . Lowest oxygen saturation: 86 which increased to 91 with purse lip breathing and rest . Highest blood pressure: 148/78 . Liters of 02: 4  Post-exercise vitals: . SpO2: 95 . HR: 92 . BP: 130/70 . Liters of O2: 2 . CBG: na Dr. Rush Farmer, Medical Director Dr. Marily Memos is immediately available during today's Pulmonary Rehab session for Suzanne Dickson on 03/11/2015  at 1330 class time  .

## 2015-03-13 ENCOUNTER — Encounter (HOSPITAL_COMMUNITY)
Admission: RE | Admit: 2015-03-13 | Discharge: 2015-03-13 | Disposition: A | Discharge status: Home / Self Care | Payer: Medicare Other | Source: Ambulatory Visit | Attending: Internal Medicine | Admitting: Internal Medicine

## 2015-03-13 DIAGNOSIS — J449 Chronic obstructive pulmonary disease, unspecified: Secondary | ICD-10-CM | POA: Diagnosis not present

## 2015-03-13 NOTE — Progress Notes (Signed)
Today, Suzanne Dickson exercised at [facility]. Service time was from 1330 to 1515.  The patient exercised by performing aerobic, strengthening, and stretching exercises. Oxygen saturation, heart rate, blood pressure, rate of perceived exertion, and shortness of breath were all monitored before, during, and after exercise. Suzanne Dickson presented with no problems at today's exercise session. She attended warning signs and symptoms class today.  The patient did  not have an increase in workload intensity during today's exercise session.  Pre-exercise vitals: . Weight kg: 72.2 . Liters of O2: 2 . SpO2: 91 . HR: 78 . BP: 128/60 . CBG: NA  Exercise vitals: . Highest heartrate:  118 . Lowest oxygen saturation: 88 . Highest blood pressure: 162/72 . Liters of 02: 4  Post-exercise vitals: . SpO2: 92 . HR: 84 . BP: 122/62 . Liters of O2: 2 . CBG: NA Dr. Rush Farmer, Medical Director Dr. Eliseo Squires is immediately available during today's Pulmonary Rehab session for Suzanne Dickson on 03/13/2015  at 1330 class time  .

## 2015-03-14 ENCOUNTER — Encounter: Payer: Self-pay | Admitting: Internal Medicine

## 2015-03-14 ENCOUNTER — Ambulatory Visit (INDEPENDENT_AMBULATORY_CARE_PROVIDER_SITE_OTHER): Payer: Medicare Other | Admitting: Internal Medicine

## 2015-03-14 VITALS — BP 122/70 | HR 82 | Ht 62.5 in | Wt 158.0 lb

## 2015-03-14 DIAGNOSIS — J9611 Chronic respiratory failure with hypoxia: Secondary | ICD-10-CM

## 2015-03-14 DIAGNOSIS — J449 Chronic obstructive pulmonary disease, unspecified: Secondary | ICD-10-CM | POA: Diagnosis not present

## 2015-03-14 MED ORDER — PANTOPRAZOLE SODIUM 40 MG PO TBEC: DELAYED_RELEASE_TABLET | ORAL | Status: DC

## 2015-03-14 MED ORDER — FAMOTIDINE 20 MG PO TABS: 20.0000 mg | ORAL_TABLET | Freq: Every day | ORAL | Status: DC

## 2015-03-14 NOTE — Patient Instructions (Signed)
Work on inhaler technique:  relax and gently blow all the way out then take a nice smooth deep breath back in, triggering the inhaler at same time you start breathing in.  Hold for up to 5 seconds if you can. Blow out thru nose. Rinse and gargle with water when done      Please schedule a follow up office visit in 6 weeks, call sooner if needed

## 2015-03-14 NOTE — Assessment & Plan Note (Addendum)
Started on 02 2lpm at d/c 09/04/14  - 10/28/2014   Walked RA x one lap @ 185 stopped due to  desat to 82%  at nl pace so rec 02 2lpm with walking and sleeping  - 12/09/2014 referred for POC > did not qualify   As of 03/14/2015  2lpm 24/7 and 5lpm pulsed walking > advised not to attempt walking at lower sat unless advised by rehab in context of close monitoring of 02 sats at different levels of activity

## 2015-03-14 NOTE — Assessment & Plan Note (Signed)
Quit smoking 08/2014  - 12/09/2014 referred to rehab > started 12/2014  .- The proper method of use, as well as anticipated side effects, of a metered-dose inhaler are discussed and demonstrated to the patient. Improved effectiveness after extensive coaching during this visit to a level of approximately 75 % from a baseline of 50  %  (ti too short)   Making slow progress at rehab but frustrated she still needs 02. ? Candidate for bevespi if she first masters hfa (vs stiolto /anoro if doesn't, depending on insurance issues.  For now > no change rx  I had an extended discussion with the patient reviewing all relevant studies completed to date and  lasting 15 to 20 minutes of a 25 minute visit    Each maintenance medication was reviewed in detail including most importantly the difference between maintenance and prns and under what circumstances the prns are to be triggered using an action plan format that is not reflected in the computer generated alphabetically organized AVS.    Please see instructions for details which were reviewed in writing and the patient given a copy highlighting the part that I personally wrote and discussed at today's ov.

## 2015-03-14 NOTE — Progress Notes (Signed)
Subjective:     Patient ID: Suzanne Dickson, female   DOB: [birth year]     MRN: [id number]    Brief patient profile:  89 yobf quit smoking on admit    Admit date: 09/02/2014 Discharge date: 09/04/2014  Recommendations for Outpatient Follow-up:  1. Recommend pulmonary rehabilitation as an outpatient. Case manager provided the patient with information regarding this. 2. Home oxygen, 2 L to use with exertion 3. Prednisone and antibiotics given along with duonebs and ICS/LABA 4. F/u with PCP in 1-2 weeks and she should follow up with Pulmonology within 1 month also.  Discharge Diagnoses:  Principal Problem:  Obstructive chronic bronchitis with exacerbation Active Problems:  TOBACCO USER  Essential hypertension  RHINOSINUSITIS, CHRONIC  Chronic bronchitis  Acute bronchitis   Discharge Condition: Stable, improved  Diet recommendation: Diabetic diet  Wt Readings from Last 3 Encounters:  09/04/14 72.96 kg (160 lb 13.6 oz)  07/30/09 69.854 kg (154 lb)  06/04/09 69.4 kg (153 lb)    History of present illness:  The patient is a 70 year old female with history of hypertension, bronchitis, chronic sinusitis, 40-pack-year smoking history who presented to the emergency department with shortness of breath and productive cough for 3 weeks prior to admission. Her cough became productive of thick yellow sputum, and her albuterol nebulizer was no longer offering her relief at home. In the emergency department, she had oxygen saturations in the low to mid 80s on room air was started on 2 L oxygen by nasal cannula. She was given IV steroids and DuoNeb's and was admitted for COPD exacerbation.  Hospital Course:   Acute hypoxic respiratory failure secondary to acute COPD exacerbation. She was started on Solu-Medrol, DuoNeb's, 2 L nasal cannula, and levofloxacin. She had rapid improvement of her subjective symptoms. She transitioned to room air on 8/9, but continued to have some  difficulty breathing with exertion. On the date of discharge, she had normal oxygen saturations at rest on room air but when she exerted herself her oxygen saturations dropped to the mid 80s. She was prescribed 2 L home O2 to use with exertion. She should continue prednisone on a longer taper because she is still having some wheezing on exam. She should complete a 5 day course of levofloxacin. She already has a nebulizer machine at home and was prescribed duo nebs for home. She was started on inhaled cortical steroids and long-acting beta agonist.  Essential hypertension, blood pressures were low normal. Her home blood pressure medications were held. She was advised to follow-up with her primary care doctor in a proximally 1 week for repeat blood pressure check. Her blood pressure medications may need to be resumed at that time.  Tobacco abuse with 40-pack-year history. Advised to quit smoking and provided a nicotine patch.  Mild steroid-induced hyperglycemia. Hemoglobin A1c is pending. Her steroids were tapered and I do not think that she will need any treatment for this at home.  Hyperkalemia was spurious. Her EKG demonstrated no evidence of peaked T waves and repeat BMP was back to normal without intervention.  Consultants:  None      10/10/2014 1st [facility] office visit/ Buddy Loeffelholz  / on ACEi since at least 2011 per chart review Chief Complaint  Patient presents with  . Pulmonary Consult    Self referral. Pt c/o increased SOB for the past month, worse x 1 wk. She gets out of breath walking to her mailbox and was out of breath walking from lobby to exam room today.  first aware of a resp problem in her 50/s = freq sinus infections and occ bad chest cold with flares but ok breathing between flares / saba just during flares but march 2016 downhill with short of breath with adls  And daily mostly dry cough eval by Dr Montez Morita and Dr Radene Journey but notes not available and then abruptly worse  prior to above admit and again worse now that off prednisone rec Stop lotrel permanently  Plan A = automatic = symbicort 160 Take 2 puffs first thing in am and then another 2 puffs about 12 hours later.  Plan B = Backup - Only use your duoneb  as a rescue medication  Work on inhaler technique:  Pantoprazole (protonix) 40 mg   Take  30-60 min before first meal of the day and Pepcid (famotidine)  20 mg one @  bedtime until return to office Please schedule a follow up office visit in 2 weeks, sooner if needed    10/28/2014  f/u ov/Malene Blaydes re: ? Copd / very poor hfa but doing great  Chief Complaint  Patient presents with  . Follow-up    Pt states that her breathing is much improved since her last visit. She has not needed neb at all.  No new co's today.    Not limited by breathing from desired activities  But very sedentary / some nasal congestion  rec Work on inhaler technique:  r  Please see patient coordinator before you leave today  to schedule sinus CT >No  sinusitis is seen.Keep on 02 for now 2lpm 24/7     12/09/2014  f/u ov/Versie Fleener re: GOLD III copd / 02 dep with exertion / maint rx symbicort though hfa poor  Chief Complaint  Patient presents with  . Follow-up    Pt followig for COPD with a PFT: pt states she is well her breathing has been pretty good, her problem is stabalizing her oxygen. pt c/o prod cough here and there and sometimes wheezing at night. pt using continuous O2 2LPM. DME: [employer].  Mailbox and back and end of road and back  all without 02  Albeit slow pace  Still using duoneb up to 4 x daily  rec Please see patient coordinator before you leave today  to schedule evaluation for POC and refer back to rehab (referral already made) Work on inhaler technique:  relax and gently blow all the way out then take a nice smooth deep breath back in, triggering the inhaler at same time you start breathing in.  Hold for up to 5 seconds if you can. Blow out thru nose. Rinse and gargle  with water when done   03/14/2015  f/u ov/Jesus Nevills re:  GOLD III / 02 dep 2lpm/ 5pulsed walking and started rehab / rx symbicort 160 2bid  Chief Complaint  Patient presents with  . Follow-up    Breathing is overall doing well. She states that she is sometimes able to take off her o2 and do housework with no problems.   improving at rehab/ much less perceived need for saba   No obvious day to day or daytime variability or assoc chronic cough or cp or chest tightness, subjective wheeze or overt sinus or hb symptoms. No unusual exp hx or h/o childhood pna/ asthma or knowledge of premature birth.  Sleeping ok without nocturnal  or early am exacerbation  of respiratory  c/o's or need for noct saba. Also denies any obvious fluctuation of symptoms with weather or environmental changes  or other aggravating or alleviating factors except as outlined above   Current Medications, Allergies, Complete Past Medical History, Past Surgical History, Family History, and Social History were reviewed in [employer] record.  ROS  The following are not active complaints unless bolded sore throat, dysphagia, dental problems, itching, sneezing,  nasal congestion or excess/ purulent secretions, ear ache,   fever, chills, sweats, unintended wt loss, classically pleuritic or exertional cp, hemoptysis,  orthopnea pnd or leg swelling, presyncope, palpitations, abdominal pain, anorexia, nausea, vomiting, diarrhea  or change in bowel or bladder habits, change in stools or urine, dysuria,hematuria,  rash, arthralgias, visual complaints, headache, numbness, weakness or ataxia or problems with walking or coordination,  change in mood/affect or memory.             Objective:   Physical Exam  amb bm nad     10/28/2014        160 > 12/09/2014 161 > 03/14/2015 158     10/10/14 160 lb (72.576 kg)  09/16/14 156 lb (70.761 kg)  09/04/14 160 lb 13.6 oz (72.96 kg)    Vital signs reviewed    HEENT: nl  dentition, turbinates, and orophanx. Nl external ear canals without cough reflex   NECK :  without JVD/Nodes/TM/ nl carotid upstrokes bilaterally   LUNGS: no acc muscle use, distant bilat bs bilaterally no wheeze     CV:  RRR  no s3 or murmur or increase in P2, no edema   ABD:  soft and nontender with nl excursion in the supine position. No bruits or organomegaly, bowel sounds nl  MS:  warm without deformities, calf tenderness, cyanosis or clubbing  SKIN: warm and dry without lesions    NEURO:  alert, approp, no deficits    I personally reviewed images and agree with radiology impression as follows:  CXR:  09/02/14 No active cardiopulmonary disease. Mild hyperinflation.      Assessment:

## 2015-03-18 ENCOUNTER — Encounter (HOSPITAL_COMMUNITY)
Admission: RE | Admit: 2015-03-18 | Discharge: 2015-03-18 | Disposition: A | Discharge status: Home / Self Care | Payer: Medicare Other | Source: Ambulatory Visit | Attending: Internal Medicine | Admitting: Internal Medicine

## 2015-03-18 DIAGNOSIS — J449 Chronic obstructive pulmonary disease, unspecified: Secondary | ICD-10-CM | POA: Diagnosis not present

## 2015-03-18 NOTE — Progress Notes (Signed)
Today, Asharia exercised at [facility]. Service time was from 1330 to 1455.  The patient exercised by performing aerobic, strengthening, and stretching exercises. Oxygen saturation, heart rate, blood pressure, rate of perceived exertion, and shortness of breath were all monitored before, during, and after exercise. Esti presented with no problems at today's exercise session.  The patient did not have an increase in workload intensity during today's exercise session.  Pre-exercise vitals: . Weight kg: 72.5 . Liters of O2: 2 . SpO2: 94 . HR: 84 . BP: 128/60 . CBG: NA  Exercise vitals: . Highest heartrate:  128 . Lowest oxygen saturation: 92 . Highest blood pressure: 142/70 . Liters of 02: 4  Post-exercise vitals: . SpO2: 93 . HR: 94 . BP: 102/56 . Liters of O2: 2 . CBG: NA Dr. Rush Farmer, Medical Director Dr. Marily Memos is immediately available during today's Pulmonary Rehab session for Annaston G Bucaro on 03/18/2015  at 1330 class time  .

## 2015-03-19 ENCOUNTER — Telehealth: Payer: Self-pay | Admitting: Internal Medicine

## 2015-03-19 NOTE — Telephone Encounter (Signed)
Form placed in MW look at. Please advise once completed. thanks

## 2015-03-19 NOTE — Telephone Encounter (Signed)
I called spoke with pt. Made aware. Nothing further needed

## 2015-03-19 NOTE — Telephone Encounter (Signed)
MW has signed form. I have faxed form to the # provided on form. ATC pt to make aware but NA and unable to leave VM. WCB

## 2015-03-20 ENCOUNTER — Encounter (HOSPITAL_COMMUNITY)
Admission: RE | Admit: 2015-03-20 | Discharge: 2015-03-20 | Disposition: A | Discharge status: Home / Self Care | Payer: Medicare Other | Source: Ambulatory Visit | Attending: Internal Medicine | Admitting: Internal Medicine

## 2015-03-20 DIAGNOSIS — J449 Chronic obstructive pulmonary disease, unspecified: Secondary | ICD-10-CM | POA: Diagnosis not present

## 2015-03-20 NOTE — Progress Notes (Signed)
Today, [name] exercised at [facility] [facility]. Service time was from 1330 to 1525.  The patient exercised by performing aerobic, strengthening, and stretching exercises. Oxygen saturation, heart rate, blood pressure, rate of perceived exertion, and shortness of breath were all monitored before, during, and after exercise. Suzanne Dickson presented with no problems at today's exercise session. She attended exercise for the pulmonary patient class today.  The patient did not have an increase in workload intensity during today's exercise session.  Pre-exercise vitals: . Weight kg: 71.7 . Liters of O2: 2 . SpO2: 93 . HR: 77 . BP: 116/72 . CBG: NA  Exercise vitals: . Highest heartrate:  118 . Lowest oxygen saturation: 94 . Highest blood pressure: 168/72 . Liters of 02: 4  Post-exercise vitals: . SpO2: 95 . HR: 91 . BP: 104/56 . Liters of O2: 2 . CBG: NA Dr. Rush Farmer, Medical Director Dr. Waldron Labs is immediately available during today's Pulmonary Rehab session for Solange G Needham on 03/20/2015  at 1330 class time.  Marland Kitchen

## 2015-03-25 ENCOUNTER — Encounter (HOSPITAL_COMMUNITY)
Admission: RE | Admit: 2015-03-25 | Discharge: 2015-03-25 | Disposition: A | Discharge status: Home / Self Care | Payer: Medicare Other | Source: Ambulatory Visit | Attending: Internal Medicine | Admitting: Internal Medicine

## 2015-03-25 DIAGNOSIS — J449 Chronic obstructive pulmonary disease, unspecified: Secondary | ICD-10-CM | POA: Diagnosis not present

## 2015-03-25 NOTE — Progress Notes (Addendum)
Daily Session Note  Patient Details  Name: Suzanne Dickson MRN: [id number] Date of Birth: [birth year] Referring Provider:  Wallene Huh, MD  Encounter Date: 03/25/2015  Check In:     Session Check In - 03/25/15 1332    Check-In   Location [facility]   Staff Present Rosebud Poles, RN, Deland Pretty, MS, ACSM CEP, Exercise Physiologist;Annedrea Rosezella Florida, RN, MHA;Portia Rollene Rotunda, RN, BSN   Supervising physician immediately available to respond to emergencies [facility] immediately available   Physician(s) Dr. Marily Memos   Medication changes reported     No   Fall or balance concerns reported    No   Warm-up and Cool-down Performed as group-led instruction   Resistance Training Performed Yes   VAD Patient? No   Pain Assessment   Currently in Pain? No/denies      Capillary Blood Glucose: No results found for this or any previous visit (from the past 24 hour(s)).      Exercise Prescription Changes - 03/25/15 1500    Exercise Review   Progression No   Response to Exercise   Blood Pressure (Admit) 98/52 mmHg   Blood Pressure (Exercise) 140/80 mmHg   Blood Pressure (Exit) 120/60 mmHg   Heart Rate (Admit) 92 bpm   Heart Rate (Exercise) 117 bpm   Heart Rate (Exit) 97 bpm   Oxygen Saturation (Admit) 95 %   Oxygen Saturation (Exercise) 92 %   Oxygen Saturation (Exit) 97 %   Rating of Perceived Exertion (Exercise) 13   Perceived Dyspnea (Exercise) 2   Symptoms none   Comments none   Duration Progress to 45 minutes of aerobic exercise without signs/symptoms of physical distress   Intensity Other (comment)  40-80% HRR   Progression   Progression Continue progressive overload as per policy without signs/symptoms or physical distress.   Resistance Training   Training Prescription Yes   Weight bands   Reps 10-12   Interval Training   Interval Training No   Oxygen   Oxygen Continuous   Liters 4   NuStep   Level 5   Minutes 15   METs 2.8   Arm  Ergometer   Level 5   Minutes 16   Track   Laps 16   Minutes 15     Goals Met:  Proper associated with RPD/PD & O2 Sat Independence with exercise equipment Improved SOB with ADL's Using PLB without cueing & demonstrates good technique Exercise tolerated well  Goals Unmet:  Not Applicable  Comments:  Arrival [place]  Dr. Rush Farmer is Medical Director for Pulmonary Rehab at [facility].

## 2015-03-27 ENCOUNTER — Encounter (HOSPITAL_COMMUNITY): Payer: Medicare Other

## 2015-04-01 ENCOUNTER — Encounter (HOSPITAL_COMMUNITY)
Admission: RE | Admit: 2015-04-01 | Discharge: 2015-04-01 | Disposition: A | Discharge status: Home / Self Care | Payer: Medicare Other | Source: Ambulatory Visit | Attending: Internal Medicine | Admitting: Internal Medicine

## 2015-04-01 DIAGNOSIS — J449 Chronic obstructive pulmonary disease, unspecified: Secondary | ICD-10-CM | POA: Insufficient documentation

## 2015-04-01 NOTE — Progress Notes (Signed)
Daily Session Note  Patient Details  Name: Suzanne Dickson MRN: [id number] Date of Birth: [birth year] Referring Provider:  Wallene Huh, MD  Encounter Date: 04/01/2015  Check In:     Session Check In - 04/01/15 1535    Check-In   Location [facility]   Staff Present Rosebud Poles, RN, Luisa Hart, RN, Deland Pretty, MS, ACSM CEP, Exercise Physiologist   Supervising physician immediately available to respond to emergencies [facility] immediately available   Physician(s) Dr. Marily Memos   Medication changes reported     No   Fall or balance concerns reported    No   Warm-up and Cool-down Performed as group-led instruction   Resistance Training Performed Yes   VAD Patient? No   Pain Assessment   Currently in Pain? No/denies      Capillary Blood Glucose: No results found for this or any previous visit (from the past 24 hour(s)).      Exercise Prescription Changes - 04/01/15 1500    Exercise Review   Progression No   Response to Exercise   Blood Pressure (Admit) 118/72 mmHg   Blood Pressure (Exercise) 150/76 mmHg   Blood Pressure (Exit) 116/64 mmHg   Heart Rate (Admit) 85 bpm   Heart Rate (Exercise) 120 bpm   Heart Rate (Exit) 84 bpm   Oxygen Saturation (Admit) 93 %   Oxygen Saturation (Exercise) 87 %   Oxygen Saturation (Exit) 95 %   Rating of Perceived Exertion (Exercise) 15   Perceived Dyspnea (Exercise) 3   Symptoms none   Comments none   Duration Progress to 45 minutes of aerobic exercise without signs/symptoms of physical distress   Intensity Other (comment)  40-80% HRR   Progression   Progression Continue progressive overload as per policy without signs/symptoms or physical distress.   Resistance Training   Training Prescription Yes   Weight bands   Reps 10-12   Interval Training   Interval Training No   Oxygen   Oxygen Continuous   Liters 4   NuStep   Level 5   Minutes 15   METs 2.7   Arm Ergometer   Level 5   Minutes 15   Track   Laps 15   Minutes 15     Goals Met:  Improved SOB with ADL's Using PLB without cueing & demonstrates good technique Personal goals reviewed No report of cardiac concerns or symptoms Strength training completed today  Goals Unmet:  Not Applicable  Comments: Service time is from 1330 to 1500   Dr. Rush Farmer is Medical Director for Pulmonary Rehab at [facility]

## 2015-04-03 ENCOUNTER — Encounter (HOSPITAL_COMMUNITY)
Admission: RE | Admit: 2015-04-03 | Discharge: 2015-04-03 | Disposition: A | Discharge status: Home / Self Care | Payer: Medicare Other | Source: Ambulatory Visit | Attending: Internal Medicine | Admitting: Internal Medicine

## 2015-04-03 DIAGNOSIS — J449 Chronic obstructive pulmonary disease, unspecified: Secondary | ICD-10-CM | POA: Diagnosis not present

## 2015-04-03 NOTE — Progress Notes (Signed)
Daily Session Note  Patient Details  Name: Suzanne Dickson MRN: [id number] Date of Birth: [birth year] Referring Provider:  Wallene Huh, MD  Encounter Date: 04/03/2015  Check In:     Session Check In - 04/03/15 1352    Check-In   Location [facility]   Staff Present Rosebud Poles, RN, Levie Heritage, MA, ACSM RCEP, Exercise Physiologist;Annedrea Rosezella [place], RN, [place];Dorna Bloom, MS, ACSM RCEP, Exercise Physiologist;Pasco Marchitto Rollene Rotunda, RN, BSN   Supervising physician immediately available to respond to emergencies [facility] immediately available   Physician(s) [place]   Medication changes reported     No   Fall or balance concerns reported    No   Warm-up and Cool-down Performed as group-led Location manager Performed Yes   VAD Patient? No   Pain Assessment   Currently in Pain? No/denies   Multiple Pain Sites No      Capillary Blood Glucose: No results found for this or any previous visit (from the past 24 hour(s)).      Exercise Prescription Changes - 04/03/15 1600    Exercise Review   Progression No   Response to Exercise   Blood Pressure (Admit) 114/60 mmHg   Blood Pressure (Exercise) 166/70 mmHg   Blood Pressure (Exit) 114/60 mmHg   Heart Rate (Admit) 78 bpm   Heart Rate (Exercise) 109 bpm   Heart Rate (Exit) 84 bpm   Oxygen Saturation (Admit) 96 %   Oxygen Saturation (Exercise) 90 %   Oxygen Saturation (Exit) 97 %   Rating of Perceived Exertion (Exercise) 13   Perceived Dyspnea (Exercise) 2   Symptoms none   Comments none   Duration Progress to 45 minutes of aerobic exercise without signs/symptoms of physical distress   Intensity THRR unchanged   Progression   Progression Continue progressive overload as per policy without signs/symptoms or physical distress.   Resistance Training   Training Prescription Yes   Weight bands   Reps 10-12   Interval Training   Interval Training No   Oxygen   Oxygen Continuous   Liters 4   NuStep   Level 5   Minutes 15   METs 2.8   Arm Ergometer   Level --   Minutes --   Track   Laps 14   Minutes 15     Goals Met:  Independence with exercise equipment Improved SOB with ADL's Using PLB without cueing & demonstrates good technique Exercise tolerated well No report of cardiac concerns or symptoms  Goals Unmet:  Not Applicable  Comments: Service time is from 1330 to 1530, see paper ITP for education.   Dr. Rush Farmer is Medical Director for Pulmonary Rehab at [facility].

## 2015-04-08 ENCOUNTER — Encounter (HOSPITAL_COMMUNITY)
Admission: RE | Admit: 2015-04-08 | Discharge: 2015-04-08 | Disposition: A | Discharge status: Home / Self Care | Payer: Medicare Other | Source: Ambulatory Visit | Attending: Internal Medicine | Admitting: Internal Medicine

## 2015-04-08 DIAGNOSIS — J449 Chronic obstructive pulmonary disease, unspecified: Secondary | ICD-10-CM | POA: Diagnosis not present

## 2015-04-08 NOTE — Progress Notes (Signed)
Daily Session Note  Patient Details  Name: Suzanne Dickson MRN: [id number] Date of Birth: 1945-08-22 Referring Provider:  Wallene Huh, MD  Encounter Date: 04/08/2015  Check In:     Session Check In - 04/08/15 1619    Check-In   Location [facility]   Staff Present Rosebud Poles, RN, BSN;Kunaal Walkins Ysidro Evert, RN;Olinty Celesta Aver, MS, ACSM CEP, Exercise Physiologist   Supervising physician immediately available to respond to emergencies [facility] immediately available   Physician(s) Dr. Marily Memos   Medication changes reported     No   Fall or balance concerns reported    No   Warm-up and Cool-down Performed as group-led instruction   Resistance Training Performed Yes   VAD Patient? No   Pain Assessment   Multiple Pain Sites No      Capillary Blood Glucose: No results found for this or any previous visit (from the past 24 hour(s)).      Exercise Prescription Changes - 04/08/15 1600    Response to Exercise   Blood Pressure (Admit) 130/60 mmHg   Blood Pressure (Exercise) 162/80 mmHg   Blood Pressure (Exit) 130/66 mmHg   Heart Rate (Admit) 87 bpm   Heart Rate (Exercise) 110 bpm   Heart Rate (Exit) 77 bpm   Oxygen Saturation (Admit) 90 %   Oxygen Saturation (Exercise) 93 %   Oxygen Saturation (Exit) 96 %   Rating of Perceived Exertion (Exercise) 13   Perceived Dyspnea (Exercise) 3   Duration Progress to 45 minutes of aerobic exercise without signs/symptoms of physical distress   Intensity THRR unchanged   Progression   Progression Continue to progress workloads to maintain intensity without signs/symptoms of physical distress.   Resistance Training   Training Prescription Yes   Weight orange bands   Reps 10-12   Interval Training   Interval Training No   Oxygen   Oxygen Continuous   Liters 4   NuStep   Level 6   Minutes 15   METs 2.7   Arm Ergometer   Level 5   Minutes 15   Track   Laps 12   Minutes 15     Goals Met:  Using PLB without  cueing & demonstrates good technique Exercise tolerated well No report of cardiac concerns or symptoms Strength training completed today  Goals Unmet:  Not Applicable  Comments: Service time is from 1330 to 1530    Dr. Rush Farmer is Medical Director for Pulmonary Rehab at [facility].

## 2015-04-10 ENCOUNTER — Encounter (HOSPITAL_COMMUNITY)
Admission: RE | Admit: 2015-04-10 | Discharge: 2015-04-10 | Disposition: A | Discharge status: Home / Self Care | Payer: Medicare Other | Source: Ambulatory Visit | Attending: Internal Medicine | Admitting: Internal Medicine

## 2015-04-10 DIAGNOSIS — J449 Chronic obstructive pulmonary disease, unspecified: Secondary | ICD-10-CM | POA: Diagnosis not present

## 2015-04-10 NOTE — Progress Notes (Signed)
Daily Session Note  Patient Details  Name: Suzanne Dickson MRN: [id number] Date of Birth: [birth year] Referring Provider:  Wallene Huh, MD  Encounter Date: 04/10/2015  Check In:     Session Check In - 04/10/15 1403    Check-In   [facility]   Staff Present Dorna Bloom, MS, ACSM RCEP, Exercise Physiologist;Portia Rollene Rotunda, RN, Maxcine Ham, RN, Fletcher Anon, M.Ed., RD, LDN, CDE, Clinical Nutritionist II;Ramon Dredge, RN, [facility]   Supervising physician immediately available to respond to emergencies [facility] immediately available   Physician(s) Dr. Jerilee Hoh   Medication changes reported     No   Fall or balance concerns reported    No   Warm-up and Cool-down Performed as group-led instruction   Resistance Training Performed Yes   VAD Patient? No   Pain Assessment   Currently in Pain? No/denies   Multiple Pain Sites No      Capillary Blood Glucose: No results found for this or any previous visit (from the past 24 hour(s)).      Exercise Prescription Changes - 04/10/15 1500    Exercise Review   Progression No   Response to Exercise   Blood Pressure (Admit) 118/66 mmHg   Blood Pressure (Exercise) 140/70 mmHg   Blood Pressure (Exit) 104/56 mmHg   Heart Rate (Admit) 86 bpm   Heart Rate (Exercise) 113 bpm   Heart Rate (Exit) 83 bpm   Oxygen Saturation (Admit) 94 %   Oxygen Saturation (Exercise) 3 %   Oxygen Saturation (Exit) 93 %   Rating of Perceived Exertion (Exercise) 13   Perceived Dyspnea (Exercise) 1   Symptoms none   Duration Progress to 45 minutes of aerobic exercise without signs/symptoms of physical distress   Intensity THRR unchanged   Progression   Progression Continue to progress workloads to maintain intensity without signs/symptoms of physical distress.   Resistance Training   Training Prescription Yes   Weight orange bands   Reps 10-12   Interval Training   Interval Training No   Oxygen   Oxygen  Continuous   Liters 4   NuStep   Level 6   Minutes 15   METs 3   Arm Ergometer   Level 5   Minutes 15     Goals Met:  Using PLB without cueing & demonstrates good technique Exercise tolerated well No report of cardiac concerns or symptoms Strength training completed today  Goals Unmet:  Not Applicable  Comments: Service time is from 1330 to 1525    Dr. Rush Farmer is Medical Director for Pulmonary Rehab at [facility].

## 2015-04-15 ENCOUNTER — Encounter (HOSPITAL_COMMUNITY)
Admission: RE | Admit: 2015-04-15 | Discharge: 2015-04-15 | Disposition: A | Discharge status: Home / Self Care | Payer: Medicare Other | Source: Ambulatory Visit | Attending: Internal Medicine | Admitting: Internal Medicine

## 2015-04-15 DIAGNOSIS — J449 Chronic obstructive pulmonary disease, unspecified: Secondary | ICD-10-CM | POA: Diagnosis not present

## 2015-04-15 NOTE — Progress Notes (Signed)
Suzanne Dickson completed a Six-Minute Walk Test on 04/15/15 . Suzanne Dickson walked 1480 feet with zero breaks.  The patient's lowest oxygen saturation was 90 %, highest heart rate was 134 bpm , and highest blood pressure was 160/80. The patient was on 4 liters of oxygen with a nasal cannula. Patient stated that nothing hindered their walk test.    Sol Passer, MS, ACSM CCEP

## 2015-04-15 NOTE — Progress Notes (Signed)
Daily Session Note  Patient Details  Name: Suzanne Dickson MRN: [id number] Date of Birth: 09/05/1945 Referring Provider:  Wallene Huh, MD  Encounter Date: 04/15/2015  Check In:     Session Check In - 04/15/15 1456    Check-In   [facility]   Staff Present Rosebud Poles, RN, Deland Pretty, MS, ACSM CEP, Exercise Physiologist   Supervising physician immediately available to respond to emergencies [facility] immediately available   Physician(s) Dr. Marily Memos   Medication changes reported     No   Fall or balance concerns reported    No   Warm-up and Cool-down Performed as group-led instruction   Resistance Training Performed Yes   VAD Patient? No   Pain Assessment   Currently in Pain? No/denies   Multiple Pain Sites No      Capillary Blood Glucose: No results found for this or any previous visit (from the past 24 hour(s)).      Exercise Prescription Changes - 04/15/15 1400    Exercise Review   Progression No   Response to Exercise   Blood Pressure (Admit) 148/78 mmHg   Blood Pressure (Exercise) 160/80 mmHg   Blood Pressure (Exit) 120/68 mmHg   Heart Rate (Admit) 93 bpm   Heart Rate (Exercise) 134 bpm   Heart Rate (Exit) 86 bpm   Oxygen Saturation (Admit) 94 %   Oxygen Saturation (Exercise) 90 %   Oxygen Saturation (Exit) 95 %   Rating of Perceived Exertion (Exercise) 13   Perceived Dyspnea (Exercise) 2   Symptoms none   Duration Progress to 45 minutes of aerobic exercise without signs/symptoms of physical distress   Intensity THRR unchanged   Progression   Progression Continue to progress workloads to maintain intensity without signs/symptoms of physical distress.   Resistance Training   Training Prescription Yes   Weight orange bands   Reps 10-12   Interval Training   Interval Training No   Oxygen   Oxygen Continuous   Liters 4   NuStep   Level 6   Minutes 5   METs 3.1  Late start due to Walk test, measurements,  d/c instructions   Arm Ergometer   Level 5   Minutes 15   Track   Laps 7  1480 ft, 6 minute walk test   Minutes 6   Home Exercise Plan   Plans to continue exercise at [place]:  [place] with exercise equipment Improved SOB with ADL's Using PLB without cueing & demonstrates good technique Achieving weight loss Exercise tolerated well Queuing for purse lip breathing No report of cardiac concerns or symptoms Strength training completed today  Goals Unmet:  Not Applicable  Comments:  Service time is from 1330 to 1500  Patient graduated from Pulmonary Rehab today. Patient improved her walk test distance and has decreased breathlessness with ADLs. Patient is currently walking 30 minutes and plans to continue exercise by walking, going to [employer], using her treadmill at home and plans to get a stationary bike at home, at least 30 minutes 5 days/ week.   Dr. Rush Farmer is Medical Director for Pulmonary Rehab at [facility].

## 2015-04-15 NOTE — Progress Notes (Signed)
Pulmonary Rehab Discharge Note Suzanne Dickson has graduated from the undergraduate pulmonary rehab program.  She attended 24 exercise sessions and was a pleasure to have in the program.  She increased her 6 minute walk test distance by 236 feet, lost 1.7 kg of weight, improved her PHQ2/9scores from 3/4 to 0/0.  She will continue exercising at home 5 days per week by riding a stationary bike, walking and plans on enrolling in a [employer] program as well.  She exercises on oxygen and has adjusted well to this.  She met her program goals which were to learn how to manage her COPD, remain smoke free, and get back to church and bible study.

## 2015-04-17 ENCOUNTER — Encounter (HOSPITAL_COMMUNITY): Payer: Medicare Other

## 2015-04-22 ENCOUNTER — Encounter (HOSPITAL_COMMUNITY): Payer: Medicare Other

## 2015-04-24 ENCOUNTER — Encounter (HOSPITAL_COMMUNITY): Payer: Medicare Other

## 2015-04-25 ENCOUNTER — Encounter: Payer: Self-pay | Admitting: Internal Medicine

## 2015-04-25 ENCOUNTER — Ambulatory Visit (INDEPENDENT_AMBULATORY_CARE_PROVIDER_SITE_OTHER): Payer: Medicare Other | Admitting: Internal Medicine

## 2015-04-25 VITALS — BP 132/68 | HR 79 | Ht 62.5 in | Wt 161.0 lb

## 2015-04-25 DIAGNOSIS — J449 Chronic obstructive pulmonary disease, unspecified: Secondary | ICD-10-CM | POA: Diagnosis not present

## 2015-04-25 DIAGNOSIS — J9611 Chronic respiratory failure with hypoxia: Secondary | ICD-10-CM

## 2015-04-25 MED ORDER — GLYCOPYRROLATE-FORMOTEROL 9-4.8 MCG/ACT IN AERO: 2.0000 | INHALATION_SPRAY | Freq: Two times a day (BID) | RESPIRATORY_TRACT | Status: DC

## 2015-04-25 NOTE — Patient Instructions (Addendum)
Instead of symbicort try BEVESPI Take 2 puffs first thing in am and then another 2 puffs about 12 hours later.    Work on inhaler technique:  relax and gently blow all the way out then take a nice smooth deep breath back in, triggering the inhaler at same time you start breathing in.  Hold for up to 5 seconds if you can. Blow out thru nose. Rinse and gargle with water when done  [facility] to leave 02 off as you are and just use 2lpm at bedtime and 5lpm pulsed when exerting      Please schedule a follow up visit in 3 months but call sooner if needed

## 2015-04-25 NOTE — Progress Notes (Signed)
Subjective:     Patient ID: Suzanne Dickson, female   DOB: 08-23-1945     MRN: [id number]    Brief patient profile:  32 yobf quit smoking on admit    Admit date: 09/02/2014 Discharge date: 09/04/2014   Discharge Diagnoses:  Principal Problem:  Obstructive chronic bronchitis with exacerbation Active Problems:  TOBACCO USER  Essential hypertension  RHINOSINUSITIS, CHRONIC  Chronic bronchitis  Acute bronchitis      Wt Readings from Last 3 Encounters:  09/04/14 72.96 kg (160 lb 13.6 oz)  07/30/09 69.854 kg (154 lb)  06/04/09 69.4 kg (153 lb)    History of present illness:  The patient is a 70 year old female with history of hypertension, bronchitis, chronic sinusitis, 40-pack-year smoking history who presented to the emergency department with shortness of breath and productive cough for 3 weeks prior to admission. Her cough became productive of thick yellow sputum, and her albuterol nebulizer was no longer offering her relief at home. In the emergency department, she had oxygen saturations in the low to mid 80s on room air was started on 2 L oxygen by nasal cannula. She was given IV steroids and DuoNeb's and was admitted for COPD exacerbation.  Hospital Course:   Acute hypoxic respiratory failure secondary to acute COPD exacerbation. She was started on Solu-Medrol, DuoNeb's, 2 L nasal cannula, and levofloxacin. She had rapid improvement of her subjective symptoms. She transitioned to room air on 8/9, but continued to have some difficulty breathing with exertion. On the date of discharge, she had normal oxygen saturations at rest on room air but when she exerted herself her oxygen saturations dropped to the mid 80s. She was prescribed 2 L home O2 to use with exertion. She should continue prednisone on a longer taper because she is still having some wheezing on exam. She should complete a 5 day course of levofloxacin. She already has a nebulizer machine at home and was  prescribed duo nebs for home. She was started on inhaled cortical steroids and long-acting beta agonist.  Essential hypertension, blood pressures were low normal. Her home blood pressure medications were held. She was advised to follow-up with her primary care doctor in a proximally 1 week for repeat blood pressure check. Her blood pressure medications may need to be resumed at that time.  Tobacco abuse with 40-pack-year history. Advised to quit smoking and provided a nicotine patch.  Mild steroid-induced hyperglycemia. Hemoglobin A1c is pending. Her steroids were tapered and I do not think that she will need any treatment for this at home.  Hyperkalemia was spurious. Her EKG demonstrated no evidence of peaked T waves and repeat BMP was back to normal without intervention.  Consultants:  None      10/10/2014 1st [facility] office visit/ Berlynn Warsame  / on ACEi since at least 2011 per chart review Chief Complaint  Patient presents with  . Pulmonary Consult    Self referral. Pt c/o increased SOB for the past month, worse x 1 wk. She gets out of breath walking to her mailbox and was out of breath walking from lobby to exam room today.   first aware of a resp problem in her 50/s = freq sinus infections and occ bad chest cold with flares but ok breathing between flares / saba just during flares but march 2016 downhill with short of breath with adls  And daily mostly dry cough eval by Dr Montez Morita and Dr Radene Journey but notes not available and then abruptly worse prior to  above admit and again worse now that off prednisone rec Stop lotrel permanently  Plan A = automatic = symbicort 160 Take 2 puffs first thing in am and then another 2 puffs about 12 hours later.  Plan B = Backup - Only use your duoneb  as a rescue medication  Work on inhaler technique:  Pantoprazole (protonix) 40 mg   Take  30-60 min before first meal of the day and Pepcid (famotidine)  20 mg one @  bedtime until return to  office Please schedule a follow up office visit in 2 weeks, sooner if needed    03/14/2015  f/u ov/Anthony Tamburo re:  GOLD III / 02 dep 2lpm/ 5pulsed walking and started rehab / rx symbicort 160 2bid  Chief Complaint  Patient presents with  . Follow-up    Breathing is overall doing well. She states that she is sometimes able to take off her o2 and do housework with no problems.   improving at rehab/ much less perceived need for saba  rec Work on inhaler technique Continue 02/ rehab   04/25/2015  f/u ov/Gordan Grell re: GOLD III / 02 dep hs and with exertion maint rx symbicort 160 2bid Chief Complaint  Patient presents with  . Follow-up    Pt states has good days and bad days depending on the weather. She has not needed Duoneb.   finished rehab/ feels it helped a lot and maintaining [facility] = can't walk a nl pace on a flat grade s sob but ok flat and slow     No obvious day to day or daytime variability or assoc chronic cough or cp or chest tightness, subjective wheeze or overt sinus or hb symptoms. No unusual exp hx or h/o childhood pna/ asthma or knowledge of premature birth.  Sleeping ok without nocturnal  or early am exacerbation  of respiratory  c/o's or need for noct saba. Also denies any obvious fluctuation of symptoms with weather or environmental changes or other aggravating or alleviating factors except as outlined above   Current Medications, Allergies, Complete Past Medical History, Past Surgical History, Family History, and Social History were reviewed in [employer] record.  ROS  The following are not active complaints unless bolded sore throat, dysphagia, dental problems, itching, sneezing,  nasal congestion or excess/ purulent secretions, ear ache,   fever, chills, sweats, unintended wt loss, classically pleuritic or exertional cp, hemoptysis,  orthopnea pnd or leg swelling, presyncope, palpitations, abdominal pain, anorexia, nausea, vomiting, diarrhea  or change  in bowel or bladder habits, change in stools or urine, dysuria,hematuria,  rash, arthralgias, visual complaints, headache, numbness, weakness or ataxia or problems with walking or coordination,  change in mood/affect or memory.             Objective:   Physical Exam  amb bf nad     10/28/2014        160 > 12/09/2014 161 > 03/14/2015 158 >    04/25/2015  161     10/10/14 160 lb (72.576 kg)  09/16/14 156 lb (70.761 kg)  09/04/14 160 lb 13.6 oz (72.96 kg)    Vital signs reviewed    HEENT: nl dentition, turbinates, and orophanx. Nl external ear canals without cough reflex   NECK :  without JVD/Nodes/TM/ nl carotid upstrokes bilaterally   LUNGS: no acc muscle use, distant bilat bs bilaterally no wheeze     CV:  RRR  no s3 or murmur or increase in P2, no edema  ABD:  soft and nontender with nl excursion in the supine position. No bruits or organomegaly, bowel sounds nl  MS:  warm without deformities, calf tenderness, cyanosis or clubbing  SKIN: warm and dry without lesions    NEURO:  alert, approp, no deficits    I personally reviewed images and agree with radiology impression as follows:  CXR:  09/02/14 No active cardiopulmonary disease. Mild hyperinflation.      Assessment:

## 2015-04-27 NOTE — Assessment & Plan Note (Signed)
Started on 02 2lpm at d/c 09/04/14  - 10/28/2014   Walked RA x one lap @ 185 stopped due to  desat to 82%  at nl pace so rec 02 2lpm with walking and sleeping  - 12/09/2014 referred for POC > did not qualify - RA at rest 04/25/2015  = 94%    As of 04/25/2015  2lpm at hs  and 5lpm pulsed walking any distance

## 2015-04-27 NOTE — Assessment & Plan Note (Addendum)
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    Please see instructions for details which were reviewed in writing and the patient given a copy highlighting the part that I personally wrote and discussed at today's ov.

## 2015-04-29 ENCOUNTER — Encounter (HOSPITAL_COMMUNITY): Payer: Medicare Other

## 2015-05-01 ENCOUNTER — Encounter (HOSPITAL_COMMUNITY): Payer: Medicare Other

## 2015-05-06 ENCOUNTER — Encounter (HOSPITAL_COMMUNITY): Payer: Medicare Other

## 2015-05-08 ENCOUNTER — Encounter (HOSPITAL_COMMUNITY): Payer: Medicare Other

## 2015-05-13 ENCOUNTER — Encounter (HOSPITAL_COMMUNITY): Payer: Medicare Other

## 2015-05-20 ENCOUNTER — Telehealth: Payer: Self-pay | Admitting: *Deleted

## 2015-05-20 NOTE — Telephone Encounter (Signed)
Submitted PA for Bevespi thru CMM. Key: [id number]  submitted for review.  [facility]; Pt ID: [id number]

## 2015-05-23 NOTE — Telephone Encounter (Signed)
Give her a sample from my office and f/u in 4 weeks as these are not interchangeable in terms of the actual delivery device

## 2015-05-23 NOTE — Telephone Encounter (Signed)
CMM still pending in [facility]

## 2015-05-23 NOTE — Telephone Encounter (Signed)
Bevespi denied. States must try Stiolto and Anoro.

## 2015-05-23 NOTE — Telephone Encounter (Signed)
Called and spoke with patient, samples left at front for her to pick up. Patient scheduled to see Dr. Melvyn Novas on 06/20/15.  Patient aware of appointment and aware to pick up samples. Nothing further needed.

## 2015-06-20 ENCOUNTER — Encounter: Payer: Self-pay | Admitting: Internal Medicine

## 2015-06-20 ENCOUNTER — Ambulatory Visit (INDEPENDENT_AMBULATORY_CARE_PROVIDER_SITE_OTHER): Payer: Medicare Other | Admitting: Internal Medicine

## 2015-06-20 VITALS — BP 122/74 | HR 83 | Ht 62.0 in | Wt 158.0 lb

## 2015-06-20 DIAGNOSIS — J9611 Chronic respiratory failure with hypoxia: Secondary | ICD-10-CM

## 2015-06-20 DIAGNOSIS — J449 Chronic obstructive pulmonary disease, unspecified: Secondary | ICD-10-CM | POA: Diagnosis not present

## 2015-06-20 NOTE — Progress Notes (Signed)
Subjective:     Patient ID: Suzanne Dickson, female   DOB: 1945-11-17     MRN: [id number]    Brief patient profile:  57 yobf quit smoking on admit 09/02/14  and proved to have GOLD III copd 11/2014   Admit date: 09/02/2014 Discharge date: 09/04/2014   Discharge Diagnoses:  Principal Problem:  Obstructive chronic bronchitis with exacerbation Active Problems:  TOBACCO USER  Essential hypertension  RHINOSINUSITIS, CHRONIC  Chronic bronchitis  Acute bronchitis      Wt Readings from Last 3 Encounters:  09/04/14 72.96 kg (160 lb 13.6 oz)  07/30/09 69.854 kg (154 lb)  06/04/09 69.4 kg (153 lb)    History of present illness:  The patient is a 70 year old female with history of hypertension, bronchitis, chronic sinusitis, 40-pack-year smoking history who presented to the emergency department with shortness of breath and productive cough for 3 weeks prior to admission. Her cough became productive of thick yellow sputum, and her albuterol nebulizer was no longer offering her relief at home. In the emergency department, she had oxygen saturations in the low to mid 80s on room air was started on 2 L oxygen by nasal cannula. She was given IV steroids and DuoNeb's and was admitted for COPD exacerbation.  Hospital Course:   Acute hypoxic respiratory failure secondary to acute COPD exacerbation. She was started on Solu-Medrol, DuoNeb's, 2 L nasal cannula, and levofloxacin. She had rapid improvement of her subjective symptoms. She transitioned to room air on 8/9, but continued to have some difficulty breathing with exertion. On the date of discharge, she had normal oxygen saturations at rest on room air but when she exerted herself her oxygen saturations dropped to the mid 80s. She was prescribed 2 L home O2 to use with exertion. She should continue prednisone on a longer taper because she is still having some wheezing on exam. She should complete a 5 day course of levofloxacin. She  already has a nebulizer machine at home and was prescribed duo nebs for home. She was started on inhaled cortical steroids and long-acting beta agonist.  Essential hypertension, blood pressures were low normal. Her home blood pressure medications were held. She was advised to follow-up with her primary care doctor in a proximally 1 week for repeat blood pressure check. Her blood pressure medications may need to be resumed at that time.  Tobacco abuse with 40-pack-year history. Advised to quit smoking and provided a nicotine patch.  Mild steroid-induced hyperglycemia. Hemoglobin A1c is pending. Her steroids were tapered and I do not think that she will need any treatment for this at home.  Hyperkalemia was spurious. Her EKG demonstrated no evidence of peaked T waves and repeat BMP was back to normal without intervention.  Consultants:  None      10/10/2014 1st [facility] office visit/ Etheridge Geil  / on ACEi since at least 2011 per chart review Chief Complaint  Patient presents with  . Pulmonary Consult    Self referral. Pt c/o increased SOB for the past month, worse x 1 wk. She gets out of breath walking to her mailbox and was out of breath walking from lobby to exam room today.   first aware of a resp problem in her 50/s = freq sinus infections and occ bad chest cold with flares but ok breathing between flares / saba just during flares but march 2016 downhill with short of breath with adls  And daily mostly dry cough eval by Dr Montez Morita and Dr Radene Journey but  notes not available and then abruptly worse prior to above admit and again worse now that off prednisone rec Stop lotrel permanently  Plan A = automatic = symbicort 160 Take 2 puffs first thing in am and then another 2 puffs about 12 hours later.  Plan B = Backup - Only use your duoneb  as a rescue medication  Work on inhaler technique:  Pantoprazole (protonix) 40 mg   Take  30-60 min before first meal of the day and Pepcid  (famotidine)  20 mg one @  bedtime until return to office       04/25/2015  f/u ov/Lexus Barletta re: GOLD III / 02 dep hs and with exertion maint rx symbicort 160 2bid Chief Complaint  Patient presents with  . Follow-up    Pt states has good days and bad days depending on the weather. She has not needed Duoneb.   finished rehab/ feels it helped a lot and maintaining [facility] = can't walk a nl pace on a flat grade s sob but ok flat and slow  rec Instead of symbicort try BEVESPI Take 2 puffs first thing in am and then another 2 puffs about 12 hours later.  Work on inhaler technique:  Ok to leave 02 off as you are and just use 2lpm at bedtime and 5lpm pulsed when exerting   06/20/2015  f/u ov/Leelah Hanna re: GOLD III copd/ 02 dep hs and prn on bevespi about the same and not on her list  Chief Complaint  Patient presents with  . Follow-up    breathing doing well.  harder to breath during the rainy days.  only uses oxygen when active.    continues with doe = MMRC2 even if uses 02     No obvious day to day or daytime variability or assoc excess/ purulent sputum or mucus plugs  or cp or chest tightness, subjective wheeze or overt sinus or hb symptoms. No unusual exp hx or h/o childhood pna/ asthma or knowledge of premature birth.  Sleeping ok without nocturnal  or early am exacerbation  of respiratory  c/o's or need for noct saba. Also denies any obvious fluctuation of symptoms with weather or environmental changes or other aggravating or alleviating factors except as outlined above   Current Medications, Allergies, Complete Past Medical History, Past Surgical History, Family History, and Social History were reviewed in [employer] record.  ROS  The following are not active complaints unless bolded sore throat, dysphagia, dental problems, itching, sneezing,  nasal congestion or excess/ purulent secretions, ear ache,   fever, chills, sweats, unintended wt loss, classically pleuritic  or exertional cp, hemoptysis,  orthopnea pnd or leg swelling, presyncope, palpitations, abdominal pain, anorexia, nausea, vomiting, diarrhea  or change in bowel or bladder habits, change in stools or urine, dysuria,hematuria,  rash, arthralgias, visual complaints, headache, numbness, weakness or ataxia or problems with walking or coordination,  change in mood/affect or memory.             Objective:   Physical Exam  amb bf nad     10/28/2014        160 > 12/09/2014 161 > 03/14/2015 158 >    04/25/2015  161 >  06/20/2015  158     10/10/14 160 lb (72.576 kg)  09/16/14 156 lb (70.761 kg)  09/04/14 160 lb 13.6 oz (72.96 kg)    Vital signs reviewed    HEENT: nl dentition, turbinates, and orophanx. Nl external ear canals without  cough reflex   NECK :  without JVD/Nodes/TM/ nl carotid upstrokes bilaterally   LUNGS: no acc muscle use, distant bilat bs bilaterally no wheeze    CV:  RRR  no s3 or murmur or increase in P2, no edema   ABD:  soft and nontender with nl excursion in the supine position. No bruits or organomegaly, bowel sounds nl  MS:  warm without deformities, calf tenderness, cyanosis or clubbing  SKIN: warm and dry without lesions    NEURO:  alert, approp, no deficits        Assessment:

## 2015-06-20 NOTE — Patient Instructions (Signed)
Plan A = Automatic =  Symbicort 160 Take 2 puffs first thing in am and then another 2 puffs about 12 hours later.      Plan B = Backup  only use your albuterol nebulizer if you can't catch your breath/  ok to use the nebulizer up to every 4 hours but if start needing it regularly call for immediate appointment    Please schedule a follow up visit in 3 months but call sooner if needed (bring your formulary in with you - this will give [place] options to choose the best for you)

## 2015-06-21 ENCOUNTER — Encounter: Payer: Self-pay | Admitting: Internal Medicine

## 2015-06-21 NOTE — Assessment & Plan Note (Signed)
Quit smoking 08/2014 - PFT's  06/20/2015  FEV1 0.75 (44 % ) ratio 62  p no % improvement from saba p ? prior to study with DLCO  24 % corrects to 48 % for alv volume    - 12/09/2014 referred to rehab > started 12/2014  - 04/25/2015   try BEVESPI   - 06/20/2015  After extensive coaching HFA effectiveness =    75% > back on symb 160 as not much change on bevespi and not on list   I had an extended discussion with the patient reviewing all relevant studies completed to date and  lasting 15 to 20 minutes of a 25 minute visit    Formulary restrictions will be an ongoing challenge for the forseable future and I would be happy to pick an alternative if the pt will first  provide me a list of them but pt  will need to return here for training for any new device that is required eg dpi vs hfa vs respimat.    In meantime we can always provide samples so the patient never runs out of any needed respiratory medications.   Each maintenance medication was reviewed in detail including most importantly the difference between maintenance and prns and under what circumstances the prns are to be triggered using an action plan format that is not reflected in the computer generated alphabetically organized AVS.    Please see instructions for details which were reviewed in writing and the patient given a copy highlighting the part that I personally wrote and discussed at today's ov.

## 2015-06-21 NOTE — Assessment & Plan Note (Signed)
Started on 02 2lpm at d/c 09/04/14  - 10/28/2014   Walked RA x one lap @ 185 stopped due to  desat to 82%  at nl pace so rec 02 2lpm with walking and sleeping  - 12/09/2014 referred for POC > did not qualify - RA at rest 04/25/2015  = 94%    As of 06/20/2015  2lpm at hs  and 5lpm pulsed walking any distance

## 2015-07-14 ENCOUNTER — Ambulatory Visit
Admission: RE | Admit: 2015-07-14 | Discharge: 2015-07-14 | Disposition: A | Discharge status: Home / Self Care | Payer: Medicare Other | Source: Ambulatory Visit | Attending: Cardiology | Admitting: Cardiology

## 2015-07-14 ENCOUNTER — Other Ambulatory Visit: Payer: Self-pay | Admitting: Cardiology

## 2015-07-14 DIAGNOSIS — M25562 Pain in left knee: Principal | ICD-10-CM

## 2015-07-14 DIAGNOSIS — M25561 Pain in right knee: Secondary | ICD-10-CM

## 2015-07-25 ENCOUNTER — Ambulatory Visit: Payer: Medicare Other | Admitting: Internal Medicine

## 2015-07-28 ENCOUNTER — Telehealth: Payer: Self-pay | Admitting: Internal Medicine

## 2015-07-28 MED ORDER — BUDESONIDE-FORMOTEROL FUMARATE 160-4.5 MCG/ACT IN AERO: 2.0000 | INHALATION_SPRAY | Freq: Two times a day (BID) | RESPIRATORY_TRACT | Status: DC

## 2015-07-28 NOTE — Telephone Encounter (Signed)
Called spoke with pt. She needs refill on symbicort. I have sent this in. Nothing further needed

## 2015-08-05 ENCOUNTER — Other Ambulatory Visit (HOSPITAL_COMMUNITY): Payer: Self-pay | Admitting: Cardiology

## 2015-08-05 DIAGNOSIS — I739 Peripheral vascular disease, unspecified: Secondary | ICD-10-CM

## 2015-08-11 ENCOUNTER — Ambulatory Visit (HOSPITAL_COMMUNITY)
Admission: RE | Admit: 2015-08-11 | Discharge: 2015-08-11 | Disposition: A | Discharge status: Home / Self Care | Payer: Medicare Other | Source: Ambulatory Visit | Attending: Cardiology | Admitting: Cardiology

## 2015-08-11 DIAGNOSIS — R938 Abnormal findings on diagnostic imaging of other specified body structures: Secondary | ICD-10-CM | POA: Insufficient documentation

## 2015-08-11 DIAGNOSIS — I739 Peripheral vascular disease, unspecified: Secondary | ICD-10-CM

## 2015-08-11 DIAGNOSIS — I10 Essential (primary) hypertension: Secondary | ICD-10-CM | POA: Diagnosis not present

## 2015-08-11 NOTE — Progress Notes (Signed)
VASCULAR LAB PRELIMINARY  ARTERIAL  ABI completed:    RIGHT    LEFT    PRESSURE WAVEFORM  PRESSURE WAVEFORM  BRACHIAL 180 Triphasic BRACHIAL 187 Triphasic  DP 110 Biphasic DP 127 Biphasic  AT   AT    PT 173 Biphasic PT 154 Biphasic  PER   PER    GREAT TOE 104 NA GREAT TOE 108 NA    RIGHT LEFT  ABI 0.93 0.82  TBI 0.56 0.58   Bilateral ABIs are sugegstive of mild arterial insufficiency at rest. Bilateral TBIs are abnormal at rest.  08/11/2015 3:38 PM Maudry Mayhew, RVT, RDCS, RDMS

## 2015-08-26 ENCOUNTER — Telehealth: Payer: Self-pay | Admitting: Internal Medicine

## 2015-08-26 MED ORDER — PREDNISONE 10 MG PO TABS: ORAL_TABLET | ORAL | 0 refills | Status: DC

## 2015-08-26 MED ORDER — AMOXICILLIN-POT CLAVULANATE 875-125 MG PO TABS: 1.0000 | ORAL_TABLET | Freq: Two times a day (BID) | ORAL | 0 refills | Status: DC

## 2015-08-26 NOTE — Telephone Encounter (Signed)
Offer augmentin 875 mg, # 14, 1 twice daily            Prednisone 10 mg, # 20, 4 X 2 DAYS, 3 X 2 DAYS, 2 X 2 DAYS, 1 X 2 DAYS  Be sure to keep appointment with Dr Melvyn Novas. If blood is coming from chest let us know and we will get outpatient CXR before appointment.

## 2015-08-26 NOTE — Telephone Encounter (Signed)
Spoke with pt and she c/o nasal and chest congestion with "dark" colored mucus tinged with red blood, increased fatigue and some increased SOB. Pt is on 2L O2 continuous. Pt denies wheeze/CP/tightness or f/n/v. Pt has taken Duoneb once but states it did not help. Pt has not taken any OTC medications. Pt states symptoms present x4 days.   MW is out of office. CY Please advise.    No Known Allergies  Current Outpatient Prescriptions on File Prior to Visit  Medication Sig Dispense Refill  . amLODipine (NORVASC) 5 MG tablet Take 1 tablet by mouth daily.    . budesonide-formoterol (SYMBICORT) 160-4.5 MCG/ACT inhaler Inhale 2 puffs into the lungs 2 (two) times daily. 1 Inhaler 6  . Glycopyrrolate-Formoterol (BEVESPI AEROSPHERE) 9-4.8 MCG/ACT AERO Inhale 2 puffs into the lungs 2 (two) times daily. 1 Inhaler 11  . hydrochlorothiazide (MICROZIDE) 12.5 MG capsule Take 12.5 mg by mouth daily.  0  . ibuprofen (ADVIL,MOTRIN) 200 MG tablet Take 200 mg by mouth every 6 (six) hours as needed.    Marland Kitchen ipratropium-albuterol (DUONEB) 0.5-2.5 (3) MG/3ML SOLN Take 3 mLs by nebulization every 6 (six) hours. 360 mL 0  . OXYGEN 2lpm with sleep and 5lpm all other times    . sertraline (ZOLOFT) 100 MG tablet Take 100 mg by mouth 2 (two) times daily.     No current facility-administered medications on file prior to visit.

## 2015-08-26 NOTE — Telephone Encounter (Signed)
Spoke with the pt and notified of recs per CDY  She verbalized understanding and nothing further needed  Rxs sent to [facility]

## 2015-08-27 ENCOUNTER — Encounter (HOSPITAL_COMMUNITY): Payer: Medicare Other

## 2015-09-05 ENCOUNTER — Other Ambulatory Visit (HOSPITAL_COMMUNITY): Payer: Self-pay | Admitting: Cardiology

## 2015-09-05 ENCOUNTER — Ambulatory Visit (HOSPITAL_COMMUNITY)
Admission: RE | Admit: 2015-09-05 | Discharge: 2015-09-05 | Disposition: A | Discharge status: Home / Self Care | Payer: Medicare Other | Source: Ambulatory Visit | Attending: Vascular Surgery | Admitting: Vascular Surgery

## 2015-09-05 DIAGNOSIS — I771 Stricture of artery: Secondary | ICD-10-CM | POA: Diagnosis not present

## 2015-09-05 DIAGNOSIS — I10 Essential (primary) hypertension: Secondary | ICD-10-CM | POA: Diagnosis not present

## 2015-09-05 LAB — VAS US LOWER EXTREMITY ARTERIAL DUPLEX
LPERODISTSYS: 55 cm/s
LPOPDPSV: -58 cm/s
LPOPPPSV: 61 cm/s
LSFDPSV: -231 cm/s
LSFMPSV: 123 cm/s
LSFPPSV: 97 cm/s
Left ant tibial distal sys: 26 cm/s
RIGHT ANT DIST TIBAL SYS PSV: 42 cm/s
RIGHT POST TIB DIST SYS: 120 cm/s
RPOPDPSV: -81 cm/s
RSFMPSV: -146 cm/s
RSFPPSV: 121 cm/s
Right popliteal prox sys PSV: 78 cm/s
Right super femoral dist sys PSV: -159 cm/s
left post tibial dist sys: 68 cm/s

## 2015-09-12 ENCOUNTER — Telehealth: Payer: Self-pay | Admitting: Internal Medicine

## 2015-09-12 NOTE — Telephone Encounter (Signed)
Pt states that she is having increased cough and hemoptysis again. Pt states that she started exercising again and has noticed that her O2 is dropping to 86%-87 with her 4 Liters O2. Pt states that she uses 2 liters regularly and 4 liters with exertional activities. Denies SOB and chest tightness. Some discomfort in her back on the right side.   Please advise Dr Melvyn Novas. Thanks.   Plan A = Automatic =  Symbicort 160 Take 2 puffs first thing in am and then another 2 puffs about 12 hours later.   Plan B = Backup  only use your albuterol nebulizer if you can't catch your breath/  ok to use the nebulizer up to every 4 hours but if start needing it regularly call for immediate appointment   Please schedule a follow up visit in 3 months but call sooner if needed (bring your formulary in with you - this will give [place] options to choose the best for you)

## 2015-09-12 NOTE — Telephone Encounter (Signed)
Due for follow up anyway next week but if not better p neb as back up on 4lpm will need to go to ER in meantime esp it hemoptysis is more than just a little streaking

## 2015-09-12 NOTE — Telephone Encounter (Signed)
Spoke with pt,aware of recs.  Nothing further needed.  

## 2015-09-22 ENCOUNTER — Ambulatory Visit (INDEPENDENT_AMBULATORY_CARE_PROVIDER_SITE_OTHER): Payer: Medicare Other | Admitting: Internal Medicine

## 2015-09-22 ENCOUNTER — Encounter: Payer: Self-pay | Admitting: Internal Medicine

## 2015-09-22 VITALS — BP 170/92 | HR 95 | Ht 62.0 in | Wt 158.6 lb

## 2015-09-22 DIAGNOSIS — J9611 Chronic respiratory failure with hypoxia: Secondary | ICD-10-CM | POA: Diagnosis not present

## 2015-09-22 DIAGNOSIS — J449 Chronic obstructive pulmonary disease, unspecified: Secondary | ICD-10-CM | POA: Diagnosis not present

## 2015-09-22 DIAGNOSIS — Z23 Encounter for immunization: Secondary | ICD-10-CM | POA: Diagnosis not present

## 2015-09-22 MED ORDER — GLYCOPYRROLATE-FORMOTEROL 9-4.8 MCG/ACT IN AERO: 2.0000 | INHALATION_SPRAY | Freq: Two times a day (BID) | RESPIRATORY_TRACT | 0 refills | Status: DC

## 2015-09-22 MED ORDER — GLYCOPYRROLATE-FORMOTEROL 9-4.8 MCG/ACT IN AERO: 2.0000 | INHALATION_SPRAY | Freq: Two times a day (BID) | RESPIRATORY_TRACT | 11 refills | Status: DC

## 2015-09-22 NOTE — Progress Notes (Signed)
Subjective:     Patient ID: Suzanne Dickson, female   DOB: 10-24-1945     MRN: [id number]    Brief patient profile:  43 yobf quit smoking on admit 09/02/14  and proved to have GOLD III copd 11/2014   Admit date: 09/02/2014 Discharge date: 09/04/2014   Discharge Diagnoses:  Principal Problem:  Obstructive chronic bronchitis with exacerbation Active Problems:  TOBACCO USER  Essential hypertension  RHINOSINUSITIS, CHRONIC  Chronic bronchitis  Acute bronchitis      Wt Readings from Last 3 Encounters:  09/04/14 72.96 kg (160 lb 13.6 oz)  07/30/09 69.854 kg (154 lb)  06/04/09 69.4 kg (153 lb)    History of present illness:  The patient is a 70 year old female with history of hypertension, bronchitis, chronic sinusitis, 40-pack-year smoking history who presented to the emergency department with shortness of breath and productive cough for 3 weeks prior to admission. Her cough became productive of thick yellow sputum, and her albuterol nebulizer was no longer offering her relief at home. In the emergency department, she had oxygen saturations in the low to mid 80s on room air was started on 2 L oxygen by nasal cannula. She was given IV steroids and DuoNeb's and was admitted for COPD exacerbation.  Hospital Course:   Acute hypoxic respiratory failure secondary to acute COPD exacerbation. She was started on Solu-Medrol, DuoNeb's, 2 L nasal cannula, and levofloxacin. She had rapid improvement of her subjective symptoms. She transitioned to room air on 8/9, but continued to have some difficulty breathing with exertion. On the date of discharge, she had normal oxygen saturations at rest on room air but when she exerted herself her oxygen saturations dropped to the mid 80s. She was prescribed 2 L home O2 to use with exertion. She should continue prednisone on a longer taper because she is still having some wheezing on exam. She should complete a 5 day course of levofloxacin. She  already has a nebulizer machine at home and was prescribed duo nebs for home. She was started on inhaled cortical steroids and long-acting beta agonist.  Essential hypertension, blood pressures were low normal. Her home blood pressure medications were held. She was advised to follow-up with her primary care doctor in a proximally 1 week for repeat blood pressure check. Her blood pressure medications may need to be resumed at that time.  Tobacco abuse with 40-pack-year history. Advised to quit smoking and provided a nicotine patch.  Mild steroid-induced hyperglycemia. Hemoglobin A1c is pending. Her steroids were tapered and I do not think that she will need any treatment for this at home.  Hyperkalemia was spurious. Her EKG demonstrated no evidence of peaked T waves and repeat BMP was back to normal without intervention.  Consultants:  None      10/10/2014 1st [facility] office visit/ Wert  / on ACEi since at least 2011 per chart review Chief Complaint  Patient presents with  . Pulmonary Consult    Self referral. Pt c/o increased SOB for the past month, worse x 1 wk. She gets out of breath walking to her mailbox and was out of breath walking from lobby to exam room today.   first aware of a resp problem in her 50/s = freq sinus infections and occ bad chest cold with flares but ok breathing between flares / saba just during flares but march 2016 downhill with short of breath with adls  And daily mostly dry cough eval by Dr Montez Morita and Dr Radene Journey but  notes not available and then abruptly worse prior to above admit and again worse now that off prednisone rec Stop lotrel permanently  Plan A = automatic = symbicort 160 Take 2 puffs first thing in am and then another 2 puffs about 12 hours later.  Plan B = Backup - Only use your duoneb  as a rescue medication  Work on inhaler technique:  Pantoprazole (protonix) 40 mg   Take  30-60 min before first meal of the day and Pepcid  (famotidine)  20 mg one @  bedtime until return to office       04/25/2015  f/u ov/Wert re: GOLD III / 02 dep hs and with exertion maint rx symbicort 160 2bid Chief Complaint  Patient presents with  . Follow-up    Pt states has good days and bad days depending on the weather. She has not needed Duoneb.   finished rehab/ feels it helped a lot and maintaining [facility] = can't walk a nl pace on a flat grade s sob but ok flat and slow  rec Instead of symbicort try BEVESPI Take 2 puffs first thing in am and then another 2 puffs about 12 hours later.  Work on inhaler technique:  Ok to leave 02 off as you are and just use 2lpm at bedtime and 5lpm pulsed when exerting   06/20/2015  f/u ov/Wert re: GOLD III copd/ 02 dep hs and prn on bevespi about the same and not on her list  Chief Complaint  Patient presents with  . Follow-up    breathing doing well.  harder to breath during the rainy days.  only uses oxygen when active.    continues with doe = MMRC2 even if uses 02  rec Plan A = Automatic =  Symbicort 160 Take 2 puffs first thing in am and then another 2 puffs about 12 hours later.  Plan B = Backup  only use your albuterol nebulizer if you can't catch your breath/  ok to use the nebulizer up to every 4 hours but if start needing it regularly call for immediate appointment Please schedule a follow up visit in 3 months but call sooner if needed (bring your formulary in with you - this will give [place] options to choose the best for you)     09/22/2015  f/u ov/Wert re: copd III/ symb 1602bid on 2lpm at rest/ 4lpm ex  Chief Complaint  Patient presents with  . Follow-up    [place] had hemoptysis and increased SOB. She took round pred and augmentin and symptoms are some better. She has noticed minimal wheezing at night. She has used neb 3 x over the past 2 wks.   liked bevespi better but wasn't covered then and not doing as well on the symbicort  though hfa not ideal  - see a/p   No obvious day to day or daytime variability or assoc excess/ purulent sputum or mucus plugs  or cp or chest tightness, or overt sinus or hb symptoms. No unusual exp hx or h/o childhood pna/ asthma or knowledge of premature birth.  Sleeping ok most nights without nocturnal  or early am exacerbation  of respiratory  c/o's or need for noct saba. Also denies any obvious fluctuation of symptoms with weather or environmental changes or other aggravating or alleviating factors except as outlined above   Current Medications, Allergies, Complete Past Medical History, Past Surgical History, Family History,  and Social History were reviewed in [employer] record.  ROS  The following are not active complaints unless bolded sore throat, dysphagia, dental problems, itching, sneezing,  nasal congestion or excess/ purulent secretions, ear ache,   fever, chills, sweats, unintended wt loss, classically pleuritic or exertional cp, hemoptysis,  orthopnea pnd or leg swelling, presyncope, palpitations, abdominal pain, anorexia, nausea, vomiting, diarrhea  or change in bowel or bladder habits, change in stools or urine, dysuria,hematuria,  rash, arthralgias, visual complaints, headache, numbness, weakness or ataxia or problems with walking or coordination,  change in mood/affect or memory.             Objective:   Physical Exam  amb bf nad     10/28/2014        160 > 12/09/2014 161 > 03/14/2015 158 >    04/25/2015  161 >  06/20/2015  158 > 09/22/2015   159     10/10/14 160 lb (72.576 kg)  09/16/14 156 lb (70.761 kg)  09/04/14 160 lb 13.6 oz (72.96 kg)    Vital signs reviewed    HEENT: nl turbinates, and orophanx. Nl external ear canals without cough reflex - upper and lower partials   NECK :  without JVD/Nodes/TM/ nl carotid upstrokes bilaterally   LUNGS: no acc muscle use, distant bilat bs bilaterally no wheeze    CV:  RRR  no s3 or murmur or  increase in P2, no edema   ABD:  soft and nontender with nl excursion in the supine position. No bruits or organomegaly, bowel sounds nl  MS:  warm without deformities, calf tenderness, cyanosis or clubbing  SKIN: warm and dry without lesions    NEURO:  alert, approp, no deficits        Assessment:

## 2015-09-22 NOTE — Patient Instructions (Addendum)
Plan A = Automatic =stop symbicort and restart Bevespi Take 2 puffs first thing in am and then another 2 puffs about 12 hours later.   Work on inhaler technique:  relax and gently blow all the way out then take a nice smooth deep breath back in, triggering the inhaler at same time you start breathing in.  Hold for up to 5 seconds if you can. Blow out thru nose. Rinse and gargle with water when done      Plan B = Backup - only use your albuterol nebulizer if you first try Plan B and it fails to help > ok to use the nebulizer up to every 4 hours but if start needing it regularly call for immediate appointment   If this is not affordable, make appt with formulary in hand   Please schedule a follow up visit in 3 months but call sooner if needed  Add: make sure has had prevnar 13 by return

## 2015-09-23 NOTE — Assessment & Plan Note (Signed)
Started on 02 2lpm at d/c 09/04/14  - 10/28/2014   Walked RA x one lap @ 185 stopped due to  desat to 82%  at nl pace so rec 02 2lpm with walking and sleeping  - 12/09/2014 referred for POC > did not qualify - RA at rest 04/25/2015  = 94%    As of 09/22/2015  2lpm at hs  and 4-5lpm pulsed walking any distance  Adequate control on present rx, reviewed > no change in rx needed

## 2015-09-23 NOTE — Assessment & Plan Note (Signed)
Quit smoking 08/2014 - PFT's  06/20/2015  FEV1 0.75 (44 % ) ratio 62  p no % improvement from saba p ? prior to study with DLCO  24 % corrects to 48 % for alv volume    - 12/09/2014 referred to rehab > started 12/2014  - 04/25/2015   try BEVESPI  - 06/20/2015  Changed by insurance> back on symb 160    - 09/22/2015 not happy with symbicort > changed back to besvespi 2bid - 09/22/2015  After extensive coaching HFA effectiveness =    75%   Pt is Group B in terms of symptom/risk and laba/lama therefore appropriate rx at this point.    I had an extended discussion with the patient reviewing all relevant studies completed to date and  lasting 15 to 20 minutes of a 25 minute visit    Each maintenance medication was reviewed in detail including most importantly the difference between maintenance and prns and under what circumstances the prns are to be triggered using an action plan format that is not reflected in the computer generated alphabetically organized AVS.    Please see instructions for details which were reviewed in writing and the patient given a copy highlighting the part that I personally wrote and discussed at today's ov.

## 2015-10-17 ENCOUNTER — Encounter: Payer: Self-pay | Admitting: Vascular Surgery

## 2015-10-23 ENCOUNTER — Ambulatory Visit (INDEPENDENT_AMBULATORY_CARE_PROVIDER_SITE_OTHER): Payer: Medicare Other | Admitting: Vascular Surgery

## 2015-10-23 ENCOUNTER — Encounter: Payer: Self-pay | Admitting: Vascular Surgery

## 2015-10-23 DIAGNOSIS — I70219 Atherosclerosis of native arteries of extremities with intermittent claudication, unspecified extremity: Secondary | ICD-10-CM | POA: Insufficient documentation

## 2015-10-23 DIAGNOSIS — I70213 Atherosclerosis of native arteries of extremities with intermittent claudication, bilateral legs: Secondary | ICD-10-CM

## 2015-10-23 NOTE — Progress Notes (Signed)
Referred by:  Wallene Huh, MD [address] [place], [place] [address]  Reason for referral: R > L knee pain   History of Present Illness  Suzanne Dickson is a 70 y.o. ([birth year]) female who presents with chief complaint: R>L leg pain.  Onset of symptom occurred over last few months with increased walking.  Pt was prescribed >30 minutes of walking daily to help with her COPD.  Pain is described as "ache", severity 3-6/10, and associated with rest and walking.   Pt notes wearing a knee brace helps the pain.   Patient has attempted to treat this pain with rest.  The patient has no rest pain symptoms also and no leg wounds/ulcers.  Atherosclerotic risk factors include: HTN, prior smoker.   Past Medical History:  Diagnosis Date  . Bronchitis, chronic ([place])   . Hypertension     Past Surgical History:  Procedure Laterality Date  . CHOLECYSTECTOMY      Social History   Social History  . Marital status: Married    Spouse name: N/A  . Number of children: N/A  . Years of education: N/A   Occupational History  . Not on file.   Social History Main Topics  . Smoking status: Former Smoker    Packs/day: 1.00    Years: 40.00    Types: Cigarettes    Quit date: 09/02/2014  . Smokeless tobacco: Never Used  . Alcohol use No  . Drug use: No  . Sexual activity: Yes   Other Topics Concern  . Not on file   Social History Narrative  . No narrative on file    Family History: patient is unable to detail the medical history of his parents   Current Outpatient Prescriptions  Medication Sig Dispense Refill  . amLODipine (NORVASC) 5 MG tablet Take 1 tablet by mouth daily.    . Glycopyrrolate-Formoterol (BEVESPI AEROSPHERE) 9-4.8 MCG/ACT AERO Inhale 2 puffs into the lungs 2 (two) times daily. 1 Inhaler 0  . Guaifenesin (MUCINEX MAXIMUM STRENGTH) 1200 MG TB12 Take 1 tablet by mouth 2 (two) times daily.    . hydrochlorothiazide (MICROZIDE) 12.5 MG capsule Take 12.5 mg by mouth  daily.  0  . ibuprofen (ADVIL,MOTRIN) 200 MG tablet Take 200 mg by mouth every 6 (six) hours as needed.    Marland Kitchen ipratropium-albuterol (DUONEB) 0.5-2.5 (3) MG/3ML SOLN Take 3 mLs by nebulization every 6 (six) hours. 360 mL 0  . OXYGEN 2lpm with sleep and 4 lpm with exertion    . sertraline (ZOLOFT) 100 MG tablet Take 100 mg by mouth 2 (two) times daily.     No current facility-administered medications for this visit.     No Known Allergies   REVIEW OF SYSTEMS:   Cardiac:  positive for: no symptoms, negative for: Chest pain or chest pressure, Shortness of breath upon exertion and Shortness of breath when lying flat,   Vascular:  positive for: pain in legs with rest and walking,  negative for: Pain in feet at night that wakes you up from your sleep and Leg swelling  Pulmonary:  positive for: Oxygen at home,  negative for: Productive cough and Wheezing  Neurologic:  positive for: Problems with dizziness, negative for: Sudden weakness in arms or legs, Sudden numbness in arms or legs, Sudden onset of difficulty speaking or slurred speech, Temporary loss of vision in one eye and Problems with dizziness  Gastrointestinal:  positive for: no symptoms, negative for: Blood in stool and Vomited blood  Genitourinary:  positive for: no symptoms, negative for: Burning when urinating and Blood in urine  Psychiatric:  positive for: no symptoms,  negative for: Major depression  Hematologic:  positive for: no symptoms,  negative for: negative for: Bleeding problems and Problems with blood clotting too easily  Dermatologic:  positive for: no symptoms, negative for: Rashes or ulcers  Constitutional:  positive for: no symptoms, negative for: Fever or chills   For VQI Use Only  PRE-ADM LIVING: Home  AMB STATUS: Ambulatory  CAD Sx: None  PRIOR CHF: None  STRESS TEST: No   Physical Examination  Vitals:   10/23/15 0911  BP: 140/75  Pulse: 76  Resp: (!) 22  Temp: 97.5 F  (36.4 C)  TempSrc: Oral  SpO2: 97%  Weight: 155 lb (70.3 kg)  Height: '5\' 2"'$  (1.575 m)    Body mass index is 28.35 kg/m.  General: Alert, O x 3, WD,NAD  Head: [place]/AT,   Ear/Nose/Throat: Hearing grossly intact, nares without erythema or drainage, oropharynx with Erythema without Exudate , Mallampati score: 3, Dentition intact  Eyes: PERRLA, EOMI,   Neck: Supple, mid-line trachea,    Pulmonary: Sym exp, good B air movt,CTA B  Cardiac: RRR, Nl S1, S2, no Murmurs, No rubs, No S3,S4  Vascular: Vessel Right Left  Radial Palpable Palpable  Brachial Palpable Palpable  Carotid Palpable, No Bruit Palpable, No Bruit  Aorta Not palpable N/A  Femoral Palpable Palpable  Popliteal Not palpable Not palpable  PT Faintly palpable Faintly palpable  DP Palpable Palpable   Gastrointestinal: soft, non-distended, non-tender to palpation, No guarding or rebound, no HSM, no masses, no CVAT B, No palpable prominent aortic pulse due to pannus    Musculoskeletal: M/S 5/5 throughout  , Extremities without ischemic changes  , No edema present,  , No LDS present  Neurologic: CN 2-12 intact , Pain and light touch intact in extremities , Motor exam as listed above  Psychiatric: Judgement intact, Mood & affect appropriate for pt's clinical situation  Dermatologic: See M/S exam for extremity exam, No rashes otherwise noted  Lymph : Palpable lymph nodes: None   Non-Invasive Vascular Imaging  ABI (Date: 08/11/15)  R:   ABI: 0.93),   DP: bi  PT: bi  TBI: ND  L:   ABI: 0.82,   DP: bi  PT: bi  TBI: ND  BLE arterial duplex (09/05/15)  BLE biphasic throughout except R peroneal (no flow)  Left mid-segment stenosis (ratio 2.3)  Outside Studies/Documentation   pages of outside documents were reviewed including: outside ABI.   Medical Decision Making  Suzanne Dickson is a 70 y.o. female who presents with: mild BLE PAD without rest pain, oxygen dependent COPD   Pt's sx are not  c/w with vasculogenic claudication.  I suspect she has an element DJD, though she does have mild BLE PAD  I suspect this patient's COPD will limit her activity level, which will further limit the possibility of vasculogenic intermittent claudication   Based on this patient's history and physical exam, I recommend: q6 month   I discussed with the patient the natural history of intermittent claudication: 75% of patients have stable or improved symptoms in a year an only 2% require amputation. Eventually 20% may require intervention in a year.  I discussed in depth with the patient the nature of atherosclerosis, and emphasized the importance of maximal medical management including strict control of blood pressure, blood glucose, and lipid levels, antiplatelet agent, obtaining regular exercise, and cessation  of smoking.    The patient is aware that without maximal medical management the underlying atherosclerotic disease process will progress, limiting the benefit of any interventions.  I discussed in depth with the patient a walking plan and how to execute such. The patient is currently on a statin:  reported not indicated. The patient is currently not on an anti-platelet.  Patient will be started on ASA 81 mg PO daily  Thank you for allowing [place] to participate in this patient's care.   Adele Barthel, MD, FACS [facility] Office: [phone] Pager: [phone]  10/23/2015, 9:05 AM

## 2015-10-26 DIAGNOSIS — J189 Pneumonia, unspecified organism: Secondary | ICD-10-CM

## 2015-10-26 HISTORY — DX: Pneumonia, unspecified organism: J18.9

## 2015-11-04 ENCOUNTER — Telehealth: Payer: Self-pay | Admitting: Internal Medicine

## 2015-11-04 ENCOUNTER — Encounter: Payer: Self-pay | Admitting: Internal Medicine

## 2015-11-04 ENCOUNTER — Ambulatory Visit (INDEPENDENT_AMBULATORY_CARE_PROVIDER_SITE_OTHER): Payer: Medicare Other | Admitting: Internal Medicine

## 2015-11-04 ENCOUNTER — Ambulatory Visit (INDEPENDENT_AMBULATORY_CARE_PROVIDER_SITE_OTHER)
Admission: RE | Admit: 2015-11-04 | Discharge: 2015-11-04 | Disposition: A | Discharge status: Home / Self Care | Payer: Medicare Other | Source: Ambulatory Visit | Attending: Internal Medicine | Admitting: Internal Medicine

## 2015-11-04 VITALS — BP 128/62 | HR 108 | Ht 62.0 in | Wt 157.0 lb

## 2015-11-04 DIAGNOSIS — J449 Chronic obstructive pulmonary disease, unspecified: Secondary | ICD-10-CM

## 2015-11-04 DIAGNOSIS — J9611 Chronic respiratory failure with hypoxia: Secondary | ICD-10-CM | POA: Diagnosis not present

## 2015-11-04 DIAGNOSIS — J209 Acute bronchitis, unspecified: Secondary | ICD-10-CM | POA: Diagnosis not present

## 2015-11-04 MED ORDER — PREDNISONE 10 MG PO TABS: ORAL_TABLET | ORAL | 0 refills | Status: DC

## 2015-11-04 MED ORDER — AZITHROMYCIN 250 MG PO TABS
ORAL_TABLET | ORAL | 0 refills | Status: DC
Start: 2015-11-04 — End: 2015-11-19

## 2015-11-04 NOTE — Progress Notes (Signed)
Subjective:     Patient ID: Suzanne Dickson, female   DOB: [birth year]     MRN: [id number]    Brief patient profile:  15 yobf quit smoking on admit 09/02/14  and proved to have GOLD III copd 11/2014   Admit date: 09/02/2014 Discharge date: 09/04/2014   Discharge Diagnoses:  Principal Problem:  Obstructive chronic bronchitis with exacerbation Active Problems:  TOBACCO USER  Essential hypertension  RHINOSINUSITIS, CHRONIC  Chronic bronchitis  Acute bronchitis      Wt Readings from Last 3 Encounters:  09/04/14 72.96 kg (160 lb 13.6 oz)  07/30/09 69.854 kg (154 lb)  06/04/09 69.4 kg (153 lb)    History of present illness:  The patient is a 70 year old female with history of hypertension, bronchitis, chronic sinusitis, 40-pack-year smoking history who presented to the emergency department with shortness of breath and productive cough for 3 weeks prior to admission. Her cough became productive of thick yellow sputum, and her albuterol nebulizer was no longer offering her relief at home. In the emergency department, she had oxygen saturations in the low to mid 80s on room air was started on 2 L oxygen by nasal cannula. She was given IV steroids and DuoNeb's and was admitted for COPD exacerbation.  Hospital Course:   Acute hypoxic respiratory failure secondary to acute COPD exacerbation. She was started on Solu-Medrol, DuoNeb's, 2 L nasal cannula, and levofloxacin. She had rapid improvement of her subjective symptoms. She transitioned to room air on 8/9, but continued to have some difficulty breathing with exertion. On the date of discharge, she had normal oxygen saturations at rest on room air but when she exerted herself her oxygen saturations dropped to the mid 80s. She was prescribed 2 L home O2 to use with exertion. She should continue prednisone on a longer taper because she is still having some wheezing on exam. She should complete a 5 day course of levofloxacin. She  already has a nebulizer machine at home and was prescribed duo nebs for home. She was started on inhaled cortical steroids and long-acting beta agonist.  Essential hypertension, blood pressures were low normal. Her home blood pressure medications were held. She was advised to follow-up with her primary care doctor in a proximally 1 week for repeat blood pressure check. Her blood pressure medications may need to be resumed at that time.  Tobacco abuse with 40-pack-year history. Advised to quit smoking and provided a nicotine patch.  Mild steroid-induced hyperglycemia. Hemoglobin A1c is pending. Her steroids were tapered and I do not think that she will need any treatment for this at home.  Hyperkalemia was spurious. Her EKG demonstrated no evidence of peaked T waves and repeat BMP was back to normal without intervention.  Consultants:  None      10/10/2014 1st [facility] office visit/ Vance Belcourt  / on ACEi since at least 2011 per chart review Chief Complaint  Patient presents with  . Pulmonary Consult    Self referral. Pt c/o increased SOB for the past month, worse x 1 wk. She gets out of breath walking to her mailbox and was out of breath walking from lobby to exam room today.   first aware of a resp problem in her 50/s = freq sinus infections and occ bad chest cold with flares but ok breathing between flares / saba just during flares but march 2016 downhill with short of breath with adls  And daily mostly dry cough eval by Dr Montez Morita and Dr Radene Journey but  notes not available and then abruptly worse prior to above admit and again worse now that off prednisone rec Stop lotrel permanently  Plan A = automatic = symbicort 160 Take 2 puffs first thing in am and then another 2 puffs about 12 hours later.  Plan B = Backup - Only use your duoneb  as a rescue medication  Work on inhaler technique:  Pantoprazole (protonix) 40 mg   Take  30-60 min before first meal of the day and Pepcid  (famotidine)  20 mg one @  bedtime until return to office       04/25/2015  f/u ov/Ronzell Laban re: GOLD III / 02 dep hs and with exertion maint rx symbicort 160 2bid Chief Complaint  Patient presents with  . Follow-up    Pt states has good days and bad days depending on the weather. She has not needed Duoneb.   finished rehab/ feels it helped a lot and maintaining [facility] = can't walk a nl pace on a flat grade s sob but ok flat and slow  rec Instead of symbicort try BEVESPI Take 2 puffs first thing in am and then another 2 puffs about 12 hours later.  Work on inhaler technique:  Ok to leave 02 off as you are and just use 2lpm at bedtime and 5lpm pulsed when exerting   06/20/2015  f/u ov/August Longest re: GOLD III copd/ 02 dep hs and prn on bevespi about the same and not on her list  Chief Complaint  Patient presents with  . Follow-up    breathing doing well.  harder to breath during the rainy days.  only uses oxygen when active.    continues with doe = MMRC2 even if uses 02  rec Plan A = Automatic =  Symbicort 160 Take 2 puffs first thing in am and then another 2 puffs about 12 hours later.  Plan B = Backup  only use your albuterol nebulizer if you can't catch your breath/  ok to use the nebulizer up to every 4 hours but if start needing it regularly call for immediate appointment Please schedule a follow up visit in 3 months but call sooner if needed (bring your formulary in with you - this will give [place] options to choose the best for you)     09/22/2015  f/u ov/Estela Vinal re: copd III/ symb 1602bid on 2lpm at rest/ 4lpm ex  Chief Complaint  Patient presents with  . Follow-up    [place] had hemoptysis and increased SOB. She took round pred and augmentin and symptoms are some better. She has noticed minimal wheezing at night. She has used neb 3 x over the past 2 wks.   liked bevespi better but wasn't covered then and not doing as well on the symbicort  though hfa not ideal  - see a/p rec Plan A = Automatic =stop symbicort and restart Bevespi Take 2 puffs first thing in am and then another 2 puffs about 12 hours later.  Work on inhaler technique:     Plan B = Backup - only use your albuterol nebulizer if you first try Plan B and it fails to help > ok to use the nebulizer up to every 4 hours but if start needing it regularly call for immediate appointment     11/04/2015 acute extended ov/Shawndrea Rutkowski re: GOLD III/ husband still smoking  maint rx BEVESPI 2 bid   Chief Complaint  Patient presents with  . Acute Visit    Pt c/o cough for the past 3 days. Her cough has been prod with bloody, yellow sputum.  She also c/o wheezing, esp when she lies down and increased SOB.    acute cough x 3 days/ new onset streaky hemoptysis  s nasal symptoms/ epistaxis  Used neb 3 x in last 24 h whereas at baseline rarely needs while maint on bevespi and comfortable at redst    No obvious day to day or daytime variability or assoc   mucus plugs  or cp or chest tightness, or overt sinus or hb symptoms. No unusual exp hx or h/o childhood pna/ asthma or knowledge of premature birth.    Also denies any obvious fluctuation of symptoms with weather or environmental changes or other aggravating or alleviating factors except as outlined above   Current Medications, Allergies, Complete Past Medical History, Past Surgical History, Family History, and Social History were reviewed in [employer] record.  ROS  The following are not active complaints unless bolded sore throat, dysphagia, dental problems, itching, sneezing,  nasal congestion or excess/ purulent secretions, ear ache,   fever, chills, sweats, unintended wt loss, classically pleuritic or exertional cp, hemoptysis,  orthopnea pnd or leg swelling, presyncope, palpitations, abdominal pain, anorexia, nausea, vomiting, diarrhea  or change in bowel or bladder habits, change in stools or urine,  dysuria,hematuria,  rash, arthralgias, visual complaints, headache, numbness, weakness or ataxia or problems with walking or coordination,  change in mood/affect or memory.             Objective:   Physical Exam  amb bf nad   - heavy industrial smell to clothing    10/28/2014        160 > 12/09/2014 161 > 03/14/2015 158 >    04/25/2015  161 >  06/20/2015  158 > 09/22/2015   159     10/10/14 160 lb (72.576 kg)  09/16/14 156 lb (70.761 kg)  09/04/14 160 lb 13.6 oz (72.96 kg)    Vital signs reviewed - - Note on arrival 02 sats  94% on 2lpm      HEENT: nl turbinates, and orophanx. Nl external ear canals without cough reflex - upper and lower partials   NECK :  without JVD/Nodes/TM/ nl carotid upstrokes bilaterally   LUNGS: no acc muscle use, distant bilat bs bilaterally no wheeze    CV:  RRR  no s3 or murmur or increase in P2, no edema   ABD:  soft and nontender with nl excursion in the supine position. No bruits or organomegaly, bowel sounds nl  MS:  warm without deformities, calf tenderness, cyanosis or clubbing  SKIN: warm and dry without lesions    NEURO:  alert, approp, no deficits   CXR PA and Lateral:   11/04/2015 :    I personally reviewed images and agree with radiology impression as follows:     COPD. Mildly increased opacity in the left mid lung, which could represent developing infection in the appropriate clinical context. No large area of consolidation. My impression: nothing correlating with the density on PA vs Lateral        Assessment:

## 2015-11-04 NOTE — Patient Instructions (Addendum)
zpak  Prednisone 10 mg take  4 each am x 2 days,   2 each am x 2 days,  1 each am x 2 days and stop   For cough mucinex dm up to every 12 hours as needed and also Try prilosec otc '20mg'$   Take 30-60 min before first meal of the day and Pepcid ac (famotidine) 20 mg one @  bedtime until cough is completely gone for at least a week without the need for cough suppression  Please remember to go to the  x-ray department downstairs for your tests - we will call you with the results when they are available.  Keep previous appointment

## 2015-11-04 NOTE — Telephone Encounter (Signed)
Spoke with pt/  C/o head congestion, sinus drainage & pressure, prod cough with some blood tinged mucus.  Gave appt with Dr Melvyn Novas today at 2:45

## 2015-11-05 ENCOUNTER — Encounter: Payer: Self-pay | Admitting: Internal Medicine

## 2015-11-05 NOTE — Assessment & Plan Note (Signed)
Quit smoking 08/2014 - PFT's  06/20/2015  FEV1 0.75 (44 % ) ratio 62  p no % improvement from saba p ? prior to study with DLCO  24 % corrects to 48 % for alv volume    - 12/09/2014 referred to rehab > started 12/2014  - 04/25/2015   try BEVESPI  - 06/20/2015  Changed by insurance> back on symb 160    - 09/22/2015 not happy with symbicort > changed back to besvespi 2bid - 09/22/2015  After extensive coaching HFA effectiveness =    75%    No need to change rx at this point as chronically doing well on bevespi and insurance covers

## 2015-11-05 NOTE — Progress Notes (Signed)
Spoke with pt and notified of results per Dr. Wert. Pt verbalized understanding and denied any questions. 

## 2015-11-05 NOTE — Assessment & Plan Note (Addendum)
Assoc with ? Infiltrate in L mid lung and low grade hemoptysis/ sore throat > rx zpak and f/u in 2 weeks, sooner if needed with levaquin if worsens in meantime  I had an extended discussion with the patient reviewing all relevant studies completed to date and  lasting 15 to 20 minutes of a 25 minute visit    Each maintenance medication was reviewed in detail including most importantly the difference between maintenance and prns and under what circumstances the prns are to be triggered using an action plan format that is not reflected in the computer generated alphabetically organized AVS.    Please see instructions for details which were reviewed in writing and the patient given a copy highlighting the part that I personally wrote and discussed at today's ov.

## 2015-11-05 NOTE — Assessment & Plan Note (Signed)
ted on 02 2lpm at d/c 09/04/14  - 10/28/2014   Walked RA x one lap @ 185 stopped due to  desat to 82%  at nl pace so rec 02 2lpm with walking and sleeping  - 12/09/2014 referred for POC > did not qualify - RA at rest 04/25/2015  = 94%    As of 11/04/2015  2lpm at hs  and 4-5lpm pulsed walking any distance

## 2015-11-19 ENCOUNTER — Ambulatory Visit (INDEPENDENT_AMBULATORY_CARE_PROVIDER_SITE_OTHER): Payer: Medicare Other | Admitting: Internal Medicine

## 2015-11-19 ENCOUNTER — Encounter: Payer: Self-pay | Admitting: Internal Medicine

## 2015-11-19 ENCOUNTER — Ambulatory Visit (INDEPENDENT_AMBULATORY_CARE_PROVIDER_SITE_OTHER)
Admission: RE | Admit: 2015-11-19 | Discharge: 2015-11-19 | Disposition: A | Discharge status: Home / Self Care | Payer: Medicare Other | Source: Ambulatory Visit | Attending: Internal Medicine | Admitting: Internal Medicine

## 2015-11-19 VITALS — BP 140/70 | HR 81 | Ht 62.5 in | Wt 159.0 lb

## 2015-11-19 DIAGNOSIS — J9611 Chronic respiratory failure with hypoxia: Secondary | ICD-10-CM

## 2015-11-19 DIAGNOSIS — R918 Other nonspecific abnormal finding of lung field: Secondary | ICD-10-CM

## 2015-11-19 DIAGNOSIS — J449 Chronic obstructive pulmonary disease, unspecified: Secondary | ICD-10-CM | POA: Diagnosis not present

## 2015-11-19 NOTE — Progress Notes (Signed)
Subjective:     Patient ID: Suzanne Dickson, female   DOB: [birth year]     MRN: [id number]    Brief patient profile:  83 yobf quit smoking on admit 09/02/14  and proved to have GOLD III copd 11/2014   Admit date: 09/02/2014 Discharge date: 09/04/2014   Discharge Diagnoses:  Principal Problem:  Obstructive chronic bronchitis with exacerbation Active Problems:  TOBACCO USER  Essential hypertension  RHINOSINUSITIS, CHRONIC  Chronic bronchitis  Acute bronchitis      Wt Readings from Last 3 Encounters:  09/04/14 72.96 kg (160 lb 13.6 oz)  07/30/09 69.854 kg (154 lb)  06/04/09 69.4 kg (153 lb)    History of present illness:  The patient is a 70 year old female with history of hypertension, bronchitis, chronic sinusitis, 40-pack-year smoking history who presented to the emergency department with shortness of breath and productive cough for 3 weeks prior to admission. Her cough became productive of thick yellow sputum, and her albuterol nebulizer was no longer offering her relief at home. In the emergency department, she had oxygen saturations in the low to mid 80s on room air was started on 2 L oxygen by nasal cannula. She was given IV steroids and DuoNeb's and was admitted for COPD exacerbation.  Hospital Course:   Acute hypoxic respiratory failure secondary to acute COPD exacerbation. She was started on Solu-Medrol, DuoNeb's, 2 L nasal cannula, and levofloxacin. She had rapid improvement of her subjective symptoms. She transitioned to room air on 8/9, but continued to have some difficulty breathing with exertion. On the date of discharge, she had normal oxygen saturations at rest on room air but when she exerted herself her oxygen saturations dropped to the mid 80s. She was prescribed 2 L home O2 to use with exertion. She should continue prednisone on a longer taper because she is still having some wheezing on exam. She should complete a 5 day course of levofloxacin. She  already has a nebulizer machine at home and was prescribed duo nebs for home. She was started on inhaled cortical steroids and long-acting beta agonist.  Essential hypertension, blood pressures were low normal. Her home blood pressure medications were held. She was advised to follow-up with her primary care doctor in a proximally 1 week for repeat blood pressure check. Her blood pressure medications may need to be resumed at that time.  Tobacco abuse with 40-pack-year history. Advised to quit smoking and provided a nicotine patch.  Mild steroid-induced hyperglycemia. Hemoglobin A1c is pending. Her steroids were tapered and I do not think that she will need any treatment for this at home.  Hyperkalemia was spurious. Her EKG demonstrated no evidence of peaked T waves and repeat BMP was back to normal without intervention.  Consultants:  None      10/10/2014 1st [facility] office visit/ Aanika Defoor  / on ACEi since at least 2011 per chart review Chief Complaint  Patient presents with  . Pulmonary Consult    Self referral. Pt c/o increased SOB for the past month, worse x 1 wk. She gets out of breath walking to her mailbox and was out of breath walking from lobby to exam room today.   first aware of a resp problem in her 50/s = freq sinus infections and occ bad chest cold with flares but ok breathing between flares / saba just during flares but march 2016 downhill with short of breath with adls  And daily mostly dry cough eval by Dr Montez Morita and Dr Radene Journey but  notes not available and then abruptly worse prior to above admit and again worse now that off prednisone rec Stop lotrel permanently  Plan A = automatic = symbicort 160 Take 2 puffs first thing in am and then another 2 puffs about 12 hours later.  Plan B = Backup - Only use your duoneb  as a rescue medication  Work on inhaler technique:  Pantoprazole (protonix) 40 mg   Take  30-60 min before first meal of the day and Pepcid  (famotidine)  20 mg one @  bedtime until return to office       04/25/2015  f/u ov/Sharisse Rantz re: GOLD III / 02 dep hs and with exertion maint rx symbicort 160 2bid Chief Complaint  Patient presents with  . Follow-up    Pt states has good days and bad days depending on the weather. She has not needed Duoneb.   finished rehab/ feels it helped a lot and maintaining Doe [facility] = can't walk a nl pace on a flat grade s sob but ok flat and slow  rec Instead of symbicort try BEVESPI Take 2 puffs first thing in am and then another 2 puffs about 12 hours later.  Work on inhaler technique:  Ok to leave 02 off as you are and just use 2lpm at bedtime and 5lpm pulsed when exerting   06/20/2015  f/u ov/Vedder Brittian re: GOLD III copd/ 02 dep hs and prn on bevespi about the same and not on her list  Chief Complaint  Patient presents with  . Follow-up    breathing doing well.  harder to breath during the rainy days.  only uses oxygen when active.    continues with doe = MMRC2 even if uses 02  rec Plan A = Automatic =  Symbicort 160 Take 2 puffs first thing in am and then another 2 puffs about 12 hours later.  Plan B = Backup  only use your albuterol nebulizer if you can't catch your breath/  ok to use the nebulizer up to every 4 hours but if start needing it regularly call for immediate appointment Please schedule a follow up visit in 3 months but call sooner if needed (bring your formulary in with you - this will give [place] options to choose the best for you)     09/22/2015  f/u ov/Shalisa Mcquade re: copd III/ symb 1602bid on 2lpm at rest/ 4lpm ex  Chief Complaint  Patient presents with  . Follow-up    [place] had hemoptysis and increased SOB. She took round pred and augmentin and symptoms are some better. She has noticed minimal wheezing at night. She has used neb 3 x over the past 2 wks.   liked bevespi better but wasn't covered then and not doing as well on the symbicort  though hfa not ideal  - see a/p rec Plan A = Automatic =stop symbicort and restart Bevespi Take 2 puffs first thing in am and then another 2 puffs about 12 hours later.  Work on inhaler technique:     Plan B = Backup - only use your albuterol nebulizer if you first try Plan B and it fails to help > ok to use the nebulizer up to every 4 hours but if start needing it regularly call for immediate appointment     11/04/2015 acute extended ov/Fritzi Scripter re: GOLD III/ husband still smoking  maint rx BEVESPI 2 bid   Chief Complaint  Patient presents with  . Acute Visit    Pt c/o cough for the past 3 days. Her cough has been prod with bloody, yellow sputum.  She also c/o wheezing, esp when she lies down and increased SOB.    acute cough x 3 days/ new onset streaky hemoptysis  s nasal symptoms/ epistaxis  Used neb 3 x in last 24 h whereas at baseline rarely needs while maint on bevespi and comfortable at rest rec zpak  Prednisone 10 mg take  4 each am x 2 days,   2 each am x 2 days,  1 each am x 2 days and stop  For cough mucinex dm up to every 12 hours as needed and also Try prilosec otc '20mg'$   Take 30-60 min before first meal of the day and Pepcid ac (famotidine) 20 mg one @  bedtime until cough is completely gone for at least a week without the need for cough suppression   11/19/2015  f/u ov/Contina Strain re:  GOLD III copd/ maint bevespi/ f/u ? LUL pna  Chief Complaint  Patient presents with  . Follow-up    Pt states her cough and SOB have improved.    no need for neb at all (duoneb)  No obvious day to day or daytime variability or assoc excess/ purulent sputum or mucus plugs  Or further hemoptysis  or cp or chest tightness, or overt sinus or hb symptoms. No unusual exp hx or h/o childhood pna/ asthma or knowledge of premature birth.    Also denies any obvious fluctuation of symptoms with weather or environmental changes or other aggravating or alleviating factors except as outlined above   Current  Medications, Allergies, Complete Past Medical History, Past Surgical History, Family History, and Social History were reviewed in [employer] record.  ROS  The following are not active complaints unless bolded sore throat, dysphagia, dental problems, itching, sneezing,  nasal congestion or excess/ purulent secretions, ear ache,   fever, chills, sweats, unintended wt loss, classically pleuritic or exertional cp, hemoptysis,  orthopnea pnd or leg swelling, presyncope, palpitations, abdominal pain, anorexia, nausea, vomiting, diarrhea  or change in bowel or bladder habits, change in stools or urine, dysuria,hematuria,  rash, arthralgias, visual complaints, headache, numbness, weakness or ataxia or problems with walking or coordination,  change in mood/affect or memory.             Objective:   Physical Exam  amb bf nad      10/28/2014        160 > 12/09/2014 161 > 03/14/2015 158 >    04/25/2015  161 >  06/20/2015  158 > 09/22/2015   159 > 11/19/2015 159     10/10/14 160 lb (72.576 kg)  09/16/14 156 lb (70.761 kg)  09/04/14 160 lb 13.6 oz (72.96 kg)    Vital signs reviewed - - Note on arrival 02 sats  95% on 2lpm      HEENT: nl turbinates, and orophanx. Nl external ear canals without cough reflex - upper and lower partials   NECK :  without JVD/Nodes/TM/ nl carotid upstrokes bilaterally   LUNGS: no acc muscle use, distant bilat bs bilaterally no wheeze    CV:  RRR  no s3 or murmur or increase in P2, no edema   ABD:  soft and nontender with nl excursion in the supine position. No bruits or organomegaly, bowel sounds nl  MS:  warm without deformities, calf tenderness, cyanosis or clubbing  SKIN: warm  and dry without lesions    NEURO:  alert, approp, no deficits      CXR PA and Lateral:   11/19/2015 :    I personally reviewed images and agree with radiology impression as follows:    Improved but persistent infiltrative density in the left mid  lung. Short-term followup is recommended. CT can be performed if the area persists.        Assessment:

## 2015-11-19 NOTE — Patient Instructions (Addendum)
No change in medications - keep your follow up appt - call sooner if needed  - needs cxr on return

## 2015-11-20 DIAGNOSIS — R911 Solitary pulmonary nodule: Secondary | ICD-10-CM | POA: Insufficient documentation

## 2015-11-20 NOTE — Assessment & Plan Note (Signed)
Quit smoking 08/2014 - PFT's  06/20/2015  FEV1 0.75 (44 % ) ratio 62  p no % improvement from saba p ? prior to study with DLCO  24 % corrects to 48 % for alv volume    - 12/09/2014 referred to rehab > started 12/2014  - 04/25/2015   try BEVESPI  - 06/20/2015  Changed by insurance> back on symb 160    - 09/22/2015 not happy with symbicort > changed back to besvespi 2bid - 09/22/2015  After extensive coaching HFA effectiveness =    75%    Back to baseline = Pt is Group B in terms of symptom/risk and laba/lama therefore appropriate rx at this point = bevespi appropriate   I had an extended discussion with the patient reviewing all relevant studies completed to date and  lasting 15 to 20 minutes of a 25 minute visit    Each maintenance medication was reviewed in detail including most importantly the difference between maintenance and prns and under what circumstances the prns are to be triggered using an action plan format that is not reflected in the computer generated alphabetically organized AVS.    Please see instructions for details which were reviewed in writing and the patient given a copy highlighting the part that I personally wrote and discussed at today's ov.

## 2015-11-20 NOTE — Assessment & Plan Note (Signed)
See cxr 11/04/15 rx zpak > improved 11/19/2015 but not cleared > recall for Dec 23 2015 in reminder file   No further rx needed, likely this was area of bronchopna with cxr lag now as she is clinically back to baseline but at risk based on prior smoking hx so if not clear at > 6 weeks from rx needs ct chest next

## 2015-11-20 NOTE — Assessment & Plan Note (Signed)
Started on 02 2lpm at d/c 09/04/14  - 10/28/2014   Walked RA x one lap @ 185 stopped due to  desat to 82%  at nl pace so rec 02 2lpm with walking and sleeping  - 12/09/2014 referred for POC > did not qualify - RA at rest 04/25/2015  = 94%    As of 11/19/2015  2lpm at hs  and 4-5lpm pulsed walking any significant distance  Adequate control on present rx, reviewed > no change in rx needed

## 2015-12-05 ENCOUNTER — Telehealth: Payer: Self-pay | Admitting: Internal Medicine

## 2015-12-05 MED ORDER — AZITHROMYCIN 250 MG PO TABS: ORAL_TABLET | ORAL | 0 refills | Status: AC

## 2015-12-05 MED ORDER — PREDNISONE 10 MG PO TABS: ORAL_TABLET | ORAL | 0 refills | Status: DC

## 2015-12-05 NOTE — Telephone Encounter (Signed)
Rx sent to preferred pharmacy. Pt aware & voiced understanding. Nothing further needed.

## 2015-12-05 NOTE — Telephone Encounter (Signed)
zpak Prednisone 10 mg take  4 each am x 2 days,   2 each am x 2 days,  1 each am x 2 days and stop  

## 2015-12-05 NOTE — Telephone Encounter (Signed)
Spoke with pt, c/o runny nose, PND, prod cough with creamy yellow mucus X1 week.  Denies fever, chest pain, chills.  Pt has been using neti pot, nasal spray, mucinex.    Pt uses [facility] on [address]  No change in medications - keep your follow up appt - call sooner if needed  - needs cxr on return

## 2015-12-23 ENCOUNTER — Ambulatory Visit (INDEPENDENT_AMBULATORY_CARE_PROVIDER_SITE_OTHER): Payer: Medicare Other | Admitting: Internal Medicine

## 2015-12-23 ENCOUNTER — Encounter: Payer: Self-pay | Admitting: Internal Medicine

## 2015-12-23 ENCOUNTER — Ambulatory Visit (INDEPENDENT_AMBULATORY_CARE_PROVIDER_SITE_OTHER)
Admission: RE | Admit: 2015-12-23 | Discharge: 2015-12-23 | Disposition: A | Discharge status: Home / Self Care | Payer: Medicare Other | Source: Ambulatory Visit | Attending: Internal Medicine | Admitting: Internal Medicine

## 2015-12-23 ENCOUNTER — Telehealth: Payer: Self-pay | Admitting: Internal Medicine

## 2015-12-23 VITALS — BP 130/70 | HR 103 | Temp 98.7°F | Ht 62.0 in | Wt 159.0 lb

## 2015-12-23 DIAGNOSIS — J449 Chronic obstructive pulmonary disease, unspecified: Secondary | ICD-10-CM | POA: Diagnosis not present

## 2015-12-23 DIAGNOSIS — R918 Other nonspecific abnormal finding of lung field: Secondary | ICD-10-CM | POA: Diagnosis not present

## 2015-12-23 DIAGNOSIS — J9611 Chronic respiratory failure with hypoxia: Secondary | ICD-10-CM | POA: Diagnosis not present

## 2015-12-23 DIAGNOSIS — J441 Chronic obstructive pulmonary disease with (acute) exacerbation: Secondary | ICD-10-CM

## 2015-12-23 MED ORDER — AMOXICILLIN-POT CLAVULANATE 875-125 MG PO TABS: 1.0000 | ORAL_TABLET | Freq: Two times a day (BID) | ORAL | 0 refills | Status: AC

## 2015-12-23 MED ORDER — GLYCOPYRROLATE-FORMOTEROL 9-4.8 MCG/ACT IN AERO: 2.0000 | INHALATION_SPRAY | Freq: Two times a day (BID) | RESPIRATORY_TRACT | 0 refills | Status: DC

## 2015-12-23 MED ORDER — ALBUTEROL SULFATE (2.5 MG/3ML) 0.083% IN NEBU: 2.5000 mg | INHALATION_SOLUTION | RESPIRATORY_TRACT | 12 refills | Status: DC | PRN

## 2015-12-23 MED ORDER — PREDNISONE 10 MG PO TABS: ORAL_TABLET | ORAL | 0 refills | Status: DC

## 2015-12-23 MED ORDER — ALBUTEROL SULFATE HFA 108 (90 BASE) MCG/ACT IN AERS: INHALATION_SPRAY | RESPIRATORY_TRACT | 11 refills | Status: DC

## 2015-12-23 NOTE — Telephone Encounter (Signed)
Aware- see result note  

## 2015-12-23 NOTE — Telephone Encounter (Signed)
IMPRESSION: Aortic atherosclerosis.  Stable nodular density seen in left midlung. CT scan of the chest with contrast administration is recommended to evaluate for possible neoplasm or malignancy. These results will be called to the ordering clinician or representative by the Radiologist Assistant, and communication documented in the PACS or zVision Dashboard.   Electronically Signed   By: Marijo Conception, M.D.   On: 12/23/2015 14:31

## 2015-12-23 NOTE — Addendum Note (Signed)
Addended by: Lianne Cure A on: 12/23/2015 03:56 PM   Modules accepted: Orders

## 2015-12-23 NOTE — Progress Notes (Signed)
Subjective:     Patient ID: Suzanne Dickson, female   DOB: 1945-02-13     MRN: [id number]    Brief patient profile:  47 yobf quit smoking on admit 09/02/14  and proved to have GOLD III copd 11/2014   Admit date: 09/02/2014 Discharge date: 09/04/2014   Discharge Diagnoses:  Principal Problem:  Obstructive chronic bronchitis with exacerbation Active Problems:  TOBACCO USER  Essential hypertension  RHINOSINUSITIS, CHRONIC  Chronic bronchitis  Acute bronchitis      Wt Readings from Last 3 Encounters:  09/04/14 72.96 kg (160 lb 13.6 oz)  07/30/09 69.854 kg (154 lb)  06/04/09 69.4 kg (153 lb)    History of present illness:  The patient is a 70 year old female with history of hypertension, bronchitis, chronic sinusitis, 40-pack-year smoking history who presented to the emergency department with shortness of breath and productive cough for 3 weeks prior to admission. Her cough became productive of thick yellow sputum, and her albuterol nebulizer was no longer offering her relief at home. In the emergency department, she had oxygen saturations in the low to mid 80s on room air was started on 2 L oxygen by nasal cannula. She was given IV steroids and DuoNeb's and was admitted for COPD exacerbation.  Hospital Course:   Acute hypoxic respiratory failure secondary to acute COPD exacerbation. She was started on Solu-Medrol, DuoNeb's, 2 L nasal cannula, and levofloxacin. She had rapid improvement of her subjective symptoms. She transitioned to room air on 8/9, but continued to have some difficulty breathing with exertion. On the date of discharge, she had normal oxygen saturations at rest on room air but when she exerted herself her oxygen saturations dropped to the mid 80s. She was prescribed 2 L home O2 to use with exertion. She should continue prednisone on a longer taper because she is still having some wheezing on exam. She should complete a 5 day course of levofloxacin. She  already has a nebulizer machine at home and was prescribed duo nebs for home. She was started on inhaled cortical steroids and long-acting beta agonist.  Essential hypertension, blood pressures were low normal. Her home blood pressure medications were held. She was advised to follow-up with her primary care doctor in a proximally 1 week for repeat blood pressure check. Her blood pressure medications may need to be resumed at that time.  Tobacco abuse with 40-pack-year history. Advised to quit smoking and provided a nicotine patch.  Mild steroid-induced hyperglycemia. Hemoglobin A1c is pending. Her steroids were tapered and I do not think that she will need any treatment for this at home.  Hyperkalemia was spurious. Her EKG demonstrated no evidence of peaked T waves and repeat BMP was back to normal without intervention.  Consultants:  None      10/10/2014 1st [facility] office visit/ Diego Delancey  / on ACEi since at least 2011 per chart review Chief Complaint  Patient presents with  . Pulmonary Consult    Self referral. Pt c/o increased SOB for the past month, worse x 1 wk. She gets out of breath walking to her mailbox and was out of breath walking from lobby to exam room today.   first aware of a resp problem in her 50/s = freq sinus infections and occ bad chest cold with flares but ok breathing between flares / saba just during flares but march 2016 downhill with short of breath with adls  And daily mostly dry cough eval by Dr Montez Morita and Dr Radene Journey but  notes not available and then abruptly worse prior to above admit and again worse now that off prednisone rec Stop lotrel permanently  Plan A = automatic = symbicort 160 Take 2 puffs first thing in am and then another 2 puffs about 12 hours later.  Plan B = Backup - Only use your duoneb  as a rescue medication  Work on inhaler technique:  Pantoprazole (protonix) 40 mg   Take  30-60 min before first meal of the day and Pepcid  (famotidine)  20 mg one @  bedtime until return to office       04/25/2015  f/u ov/Cleone Hulick re: GOLD III / 02 dep hs and with exertion maint rx symbicort 160 2bid Chief Complaint  Patient presents with  . Follow-up    Pt states has good days and bad days depending on the weather. She has not needed Duoneb.   finished rehab/ feels it helped a lot and maintaining Doe [facility] = can't walk a nl pace on a flat grade s sob but ok flat and slow  rec Instead of symbicort try BEVESPI Take 2 puffs first thing in am and then another 2 puffs about 12 hours later.  Work on inhaler technique:  Ok to leave 02 off as you are and just use 2lpm at bedtime and 5lpm pulsed when exerting   06/20/2015  f/u ov/Denni France re: GOLD III copd/ 02 dep hs and prn on bevespi about the same and not on her list  Chief Complaint  Patient presents with  . Follow-up    breathing doing well.  harder to breath during the rainy days.  only uses oxygen when active.    continues with doe = MMRC2 even if uses 02  rec Plan A = Automatic =  Symbicort 160 Take 2 puffs first thing in am and then another 2 puffs about 12 hours later.  Plan B = Backup  only use your albuterol nebulizer if you can't catch your breath/  ok to use the nebulizer up to every 4 hours but if start needing it regularly call for immediate appointment Please schedule a follow up visit in 3 months but call sooner if needed (bring your formulary in with you - this will give [place] options to choose the best for you)     09/22/2015  f/u ov/Javontay Vandam re: copd III/ symb 1602bid on 2lpm at rest/ 4lpm ex  Chief Complaint  Patient presents with  . Follow-up    [place] day had hemoptysis and increased SOB. She took round pred and augmentin and symptoms are some better. She has noticed minimal wheezing at night. She has used neb 3 x over the past 2 wks.   liked bevespi better but wasn't covered then and not doing as well on the symbicort  though hfa not ideal  - see a/p rec Plan A = Automatic =stop symbicort and restart Bevespi Take 2 puffs first thing in am and then another 2 puffs about 12 hours later.  Work on inhaler technique:     Plan B = Backup - only use your albuterol nebulizer if you first try Plan B and it fails to help > ok to use the nebulizer up to every 4 hours but if start needing it regularly call for immediate appointment    12/23/2015  f/u ov/Kaizlee Carlino re: GOLD III copd/ maint bevespi/ f/u ? LUL pna  Chief Complaint  Patient presents with  .  Follow-up    Pt c/o increased SOB and congestion for the past 5 days. She states she started coughing up thick, yellow sputum over the past 2 days.    started with nasal congestion acutely  Then seemed to spread  down to the  chest with thick mucus/ more sob but comfortable p neb   No obvious day to day or daytime variability or assoc excess/ purulent sputum or mucus plugs  Or further hemoptysis  or cp or chest tightness, or overt sinus or hb symptoms. No unusual exp hx or h/o childhood pna/ asthma or knowledge of premature birth.    Also denies any obvious fluctuation of symptoms with weather or environmental changes or other aggravating or alleviating factors except as outlined above   Current Medications, Allergies, Complete Past Medical History, Past Surgical History, Family History, and Social History were reviewed in [employer] record.  ROS  The following are not active complaints unless bolded sore throat, dysphagia, dental problems, itching, sneezing,  nasal congestion or excess/ purulent secretions, ear ache,   fever, chills, sweats, unintended wt loss, classically pleuritic or exertional cp, hemoptysis,  orthopnea pnd or leg swelling, presyncope, palpitations, abdominal pain, anorexia, nausea, vomiting, diarrhea  or change in bowel or bladder habits, change in stools or urine, dysuria,hematuria,  rash, arthralgias, visual complaints, headache,  numbness, weakness or ataxia or problems with walking or coordination,  change in mood/affect or memory.             Objective:   Physical Exam  amb bf nad   p duoneb 2.5 h prior to OV     10/28/2014        160 > 12/09/2014 161 > 03/14/2015 158 >    04/25/2015  161 >  06/20/2015  158 > 09/22/2015   159 > 11/19/2015 159 > 12/23/2015   159     10/10/14 160 lb (72.576 kg)  09/16/14 156 lb (70.761 kg)  09/04/14 160 lb 13.6 oz (72.96 kg)    Vital signs reviewed - - Note on arrival 02 sats  93% on 2lpm continuous      HEENT: nl turbinates, and oropharynx. Nl external ear canals without cough reflex - upper and lower partials   NECK :  without JVD/Nodes/TM/ nl carotid upstrokes bilaterally   LUNGS: no acc muscle use, distant   pan exp rhonchi bilaterally    CV:  RRR  no s3 or murmur or increase in P2, no edema   ABD:  soft and nontender with nl excursion in the supine position. No bruits or organomegaly, bowel sounds nl  MS:  warm without deformities, calf tenderness, cyanosis or clubbing  SKIN: warm and dry without lesions    NEURO:  alert, approp, no deficits      CXR PA and Lateral:   12/23/2015 :    I personally reviewed images and agree with radiology impression as follows:   Stable nodular density seen in left midlung. CT scan of the chest with contrast administration is recommended to evaluate for possible neoplasm or malignancy.       Assessment:

## 2015-12-23 NOTE — Patient Instructions (Addendum)
Plan A = Automatic = Bevespi Take 2 puffs first thing in am and then another 2 puffs about 12 hours later.   Work on inhaler technique:  relax and gently blow all the way out then take a nice smooth deep breath back in, triggering the inhaler at same time you start breathing in.  Hold for up to 5 seconds if you can. Blow out thru nose. Rinse and gargle with water when done      Plan B = Backup Only use your albuterol(proair)  as a rescue medication to be used if you can't catch your breath by resting or doing a relaxed purse lip breathing pattern.  - The less you use it, the better it will work when you need it. - Ok to use the inhaler up to 2 puffs  every 4 hours if you must but call for appointment if use goes up over your usual need - Don't leave home without it !!  (think of it like the spare tire for your car)   Plan C = Crisis - only use your albuterol (ok to use up duoneb) nebulizer if you first try Plan B and it fails to help > ok to use the nebulizer up to every 4 hours but if start needing it regularly call for immediate appointment   Augmentin 875 mg take one pill twice daily  X 10 days - take at breakfast and supper with large glass of water.  It would help reduce the usual side effects (diarrhea and yeast infections) if you ate cultured yogurt at lunch.   Prednisone 10 mg take  4 each am x 2 days,   2 each am x 2 days,  1 each am x 2 days and stop   Please remember to go to the  x-ray department downstairs for your tests - we will call you with the results when they are available.  Please schedule a follow up office visit in 6 weeks, call sooner if needed

## 2015-12-24 NOTE — Assessment & Plan Note (Signed)
Quit smoking 08/2014 - PFT's  06/20/2015  FEV1 0.75 (44 % ) ratio 62  p no % improvement from saba p ? prior to study with DLCO  24 % corrects to 48 % for alv volume    - 12/09/2014 referred to rehab > started 12/2014  - 04/25/2015   try BEVESPI  - 06/20/2015  Changed by insurance> back on symb 160  - 09/22/2015 not happy with symbicort > changed back to besvespi 2bid  - 12/23/2015  After extensive coaching HFA effectiveness =    75%   Mild flare on laba/lama but preferred this over symbicort so one option is resume symbicort and add spiriva respimat if continues with flares c/w Pt is probably  Group D in terms of symptom/risk and laba/lama therefore appropriate rx at this point.   For now rx short term with augmentin / pred x 6 d only   I had an extended discussion with the patient reviewing all relevant studies completed to date and  lasting 15 to 20 minutes of a 25 minute visit    Each maintenance medication was reviewed in detail including most importantly the difference between maintenance and prns and under what circumstances the prns are to be triggered using an action plan format that is not reflected in the computer generated alphabetically organized AVS.    Please see AVS for unique instructions that I personally wrote and verbalized to the the pt in detail and then reviewed with pt  by my nurse highlighting any  changes in therapy recommended at today's visit to their plan of care.

## 2015-12-24 NOTE — Assessment & Plan Note (Signed)
See copd

## 2015-12-24 NOTE — Assessment & Plan Note (Signed)
Not present Sep 02 2015 See cxr 11/04/15 rx zpak > improved 11/19/2015 but not cleared   - no change 12/23/2015  > f/u 6 weeks ? bx p holidays but clearly not a surgical candidate   Discussed in detail all the  indications, usual  risks and alternatives  relative to the benefits with patient who agrees to proceed with conservative f/u as outlined

## 2015-12-24 NOTE — Assessment & Plan Note (Signed)
Started on 02 2lpm at d/c 09/04/14  - 10/28/2014   Walked RA x one lap @ 185 stopped due to  desat to 82%  at nl pace so rec 02 2lpm with walking and sleeping  - 12/09/2014 referred for POC > did not qualify - RA at rest 04/25/2015  = 94%    As of 12/23/2015  2lpm at hs  and 4-5lpm pulsed walking any distance

## 2015-12-25 NOTE — Progress Notes (Signed)
Spoke with pt and notified of results per Dr. Wert. Pt verbalized understanding and denied any questions. 

## 2016-02-03 ENCOUNTER — Ambulatory Visit (INDEPENDENT_AMBULATORY_CARE_PROVIDER_SITE_OTHER): Payer: Medicare Other | Admitting: Internal Medicine

## 2016-02-03 ENCOUNTER — Ambulatory Visit (INDEPENDENT_AMBULATORY_CARE_PROVIDER_SITE_OTHER)
Admission: RE | Admit: 2016-02-03 | Discharge: 2016-02-03 | Disposition: A | Discharge status: Home / Self Care | Payer: Medicare Other | Source: Ambulatory Visit | Attending: Internal Medicine | Admitting: Internal Medicine

## 2016-02-03 ENCOUNTER — Encounter: Payer: Self-pay | Admitting: Internal Medicine

## 2016-02-03 VITALS — BP 136/70 | HR 111 | Ht 62.0 in | Wt 163.0 lb

## 2016-02-03 DIAGNOSIS — J449 Chronic obstructive pulmonary disease, unspecified: Secondary | ICD-10-CM

## 2016-02-03 DIAGNOSIS — J9611 Chronic respiratory failure with hypoxia: Secondary | ICD-10-CM | POA: Diagnosis not present

## 2016-02-03 DIAGNOSIS — R918 Other nonspecific abnormal finding of lung field: Secondary | ICD-10-CM

## 2016-02-03 MED ORDER — GLYCOPYRROLATE-FORMOTEROL 9-4.8 MCG/ACT IN AERO: 2.0000 | INHALATION_SPRAY | Freq: Two times a day (BID) | RESPIRATORY_TRACT | 0 refills | Status: DC

## 2016-02-03 NOTE — Progress Notes (Signed)
Subjective:     Patient ID: Suzanne Dickson, female   DOB: 1945-02-13     MRN: [id number]    Brief patient profile:  47 yobf quit smoking on admit 09/02/14  and proved to have GOLD III copd 11/2014   Admit date: 09/02/2014 Discharge date: 09/04/2014   Discharge Diagnoses:  Principal Problem:  Obstructive chronic bronchitis with exacerbation Active Problems:  TOBACCO USER  Essential hypertension  RHINOSINUSITIS, CHRONIC  Chronic bronchitis  Acute bronchitis      Wt Readings from Last 3 Encounters:  09/04/14 72.96 kg (160 lb 13.6 oz)  07/30/09 69.854 kg (154 lb)  06/04/09 69.4 kg (153 lb)    History of present illness:  The patient is a 71 year old female with history of hypertension, bronchitis, chronic sinusitis, 40-pack-year smoking history who presented to the emergency department with shortness of breath and productive cough for 3 weeks prior to admission. Her cough became productive of thick yellow sputum, and her albuterol nebulizer was no longer offering her relief at home. In the emergency department, she had oxygen saturations in the low to mid 80s on room air was started on 2 L oxygen by nasal cannula. She was given IV steroids and DuoNeb's and was admitted for COPD exacerbation.  Hospital Course:   Acute hypoxic respiratory failure secondary to acute COPD exacerbation. She was started on Solu-Medrol, DuoNeb's, 2 L nasal cannula, and levofloxacin. She had rapid improvement of her subjective symptoms. She transitioned to room air on 8/9, but continued to have some difficulty breathing with exertion. On the date of discharge, she had normal oxygen saturations at rest on room air but when she exerted herself her oxygen saturations dropped to the mid 80s. She was prescribed 2 L home O2 to use with exertion. She should continue prednisone on a longer taper because she is still having some wheezing on exam. She should complete a 5 day course of levofloxacin. She  already has a nebulizer machine at home and was prescribed duo nebs for home. She was started on inhaled cortical steroids and long-acting beta agonist.  Essential hypertension, blood pressures were low normal. Her home blood pressure medications were held. She was advised to follow-up with her primary care doctor in a proximally 1 week for repeat blood pressure check. Her blood pressure medications may need to be resumed at that time.  Tobacco abuse with 40-pack-year history. Advised to quit smoking and provided a nicotine patch.  Mild steroid-induced hyperglycemia. Hemoglobin A1c is pending. Her steroids were tapered and I do not think that she will need any treatment for this at home.  Hyperkalemia was spurious. Her EKG demonstrated no evidence of peaked T waves and repeat BMP was back to normal without intervention.  Consultants:  None      10/10/2014 1st [facility] office visit/ Suzanne Dickson  / on ACEi since at least 2011 per chart review Chief Complaint  Patient presents with  . Pulmonary Consult    Self referral. Pt c/o increased SOB for the past month, worse x 1 wk. She gets out of breath walking to her mailbox and was out of breath walking from lobby to exam room today.   first aware of a resp problem in her 50/s = freq sinus infections and occ bad chest cold with flares but ok breathing between flares / saba just during flares but march 2016 downhill with short of breath with adls  And daily mostly dry cough eval by Suzanne Dickson and Suzanne Dickson but  notes not available and then abruptly worse prior to above admit and again worse now that off prednisone rec Stop lotrel permanently  Plan A = automatic = symbicort 160 Take 2 puffs first thing in am and then another 2 puffs about 12 hours later.  Plan B = Backup - Only use your duoneb  as a rescue medication  Work on inhaler technique:  Pantoprazole (protonix) 40 mg   Take  30-60 min before first meal of the day and Pepcid  (famotidine)  20 mg one @  bedtime until return to office       04/25/2015  f/u ov/Suzanne Dickson re: GOLD III / 02 dep hs and with exertion maint rx symbicort 160 2bid Chief Complaint  Patient presents with  . Follow-up    Pt states has good days and bad days depending on the weather. She has not needed Duoneb.   finished rehab/ feels it helped a lot and maintaining [facility] = can't walk a nl pace on a flat grade s sob but ok flat and slow  rec Instead of symbicort try BEVESPI Take 2 puffs first thing in am and then another 2 puffs about 12 hours later.  Work on inhaler technique:  Ok to leave 02 off as you are and just use 2lpm at bedtime and 5lpm pulsed when exerting   06/20/2015  f/u ov/Suzanne Dickson re: GOLD III copd/ 02 dep hs and prn on bevespi about the same and not on her list  Chief Complaint  Patient presents with  . Follow-up    breathing doing well.  harder to breath during the rainy days.  only uses oxygen when active.    continues with doe = MMRC2 even if uses 02  rec Plan A = Automatic =  Symbicort 160 Take 2 puffs first thing in am and then another 2 puffs about 12 hours later.  Plan B = Backup  only use your albuterol nebulizer if you can't catch your breath/  ok to use the nebulizer up to every 4 hours but if start needing it regularly call for immediate appointment Please schedule a follow up visit in 3 months but call sooner if needed (bring your formulary in with you - this will give [place] options to choose the best for you)     09/22/2015  f/u ov/Suzanne Dickson re: copd III/ symb 1602bid on 2lpm at rest/ 4lpm ex  Chief Complaint  Patient presents with  . Follow-up    [place] had hemoptysis and increased SOB. She took round pred and augmentin and symptoms are some better. She has noticed minimal wheezing at night. She has used neb 3 x over the past 2 wks.   liked bevespi better but wasn't covered then and not doing as well on the symbicort  though hfa not ideal  - see a/p rec Plan A = Automatic =stop symbicort and restart Bevespi Take 2 puffs first thing in am and then another 2 puffs about 12 hours later.  Work on inhaler technique:     Plan B = Backup - only use your albuterol nebulizer if you first try Plan B and it fails to help > ok to use the nebulizer up to every 4 hours but if start needing it regularly call for immediate appointment    12/23/2015  f/u ov/Suzanne Dickson re: GOLD III copd/ maint bevespi/ f/u ? [facility] pna  Chief Complaint  Patient presents with  .  Follow-up    Pt c/o increased SOB and congestion for the past 5 days. She states she started coughing up thick, yellow sputum over the past 2 days.    started with nasal congestion acutely  Then seemed to spread  down to the  chest with thick mucus/ more sob but comfortable p neb  rec Plan A = Automatic = Bevespi Take 2 puffs first thing in am and then another 2 puffs about 12 hours later.  Work on inhaler technique:  relax and gently blow all the way out then take a nice smooth deep breath back in, triggering the inhaler at same time you start breathing in.  Hold for up to 5 seconds if you can. Blow out thru nose. Rinse and gargle with water when done Plan B = Backup Only use your albuterol(proair)  as a rescue medication  Plan C = Crisis - only use your albuterol (ok to use up duoneb) nebulizer if you first try Plan B and it fails to help > ok to use the nebulizer up to every 4 hours but if start needing it regularly call for immediate appointment Augmentin 875 mg take one pill twice daily  X 10 days - take at breakfast and supper with large glass of water.  It would help reduce the usual side effects (diarrhea and yeast infections) if you ate cultured yogurt at lunch.  Prednisone 10 mg take  4 each am x 2 days,   2 each am x 2 days,  1 each am x 2 days and stop  Please remember to go to the  x-ray department downstairs for your tests - we will call you with the results  when they are available   02/03/2016  f/u ov/Suzanne Dickson re:  GOLD III copd/ maint bevespi  F/u ? SPN  Chief Complaint  Patient presents with  . Follow-up    Breathing states her breathing has been doing well, but seems worse today. She has not needed albuterol since her last visit.  She has noticed increased cough today-prod with thick, cream colored sputum.    worse x 24 h/ has not tried albuterol yet/ assoc runny nose watery mostly    No obvious day to day or daytime variability or assoc excess/ purulent sputum or mucus plugs  Or further hemoptysis  or cp or chest tightness, or overt sinus or hb symptoms. No unusual exp hx or h/o childhood pna/ asthma or knowledge of premature birth.    Also denies any obvious fluctuation of symptoms with weather or environmental changes or other aggravating or alleviating factors except as outlined above   Current Medications, Allergies, Complete Past Medical History, Past Surgical History, Family History, and Social History were reviewed in [employer] record.  ROS  The following are not active complaints unless bolded sore throat, dysphagia, dental problems, itching, sneezing,  nasal congestion or excess watery secrections, ear ache,   fever, chills, sweats, unintended wt loss, classically pleuritic or exertional cp, hemoptysis,  orthopnea pnd or leg swelling, presyncope, palpitations, abdominal pain, anorexia, nausea, vomiting, diarrhea  or change in bowel or bladder habits, change in stools or urine, dysuria,hematuria,  rash, arthralgias, visual complaints, headache, numbness, weakness or ataxia or problems with walking or coordination,  change in mood/affect or memory.             Objective:   Physical Exam  amb bf nad       10/28/2014  160 > 12/09/2014 161 > 03/14/2015 158 >    04/25/2015  161 >  06/20/2015  158 > 09/22/2015   159 > 11/19/2015 159 > 12/23/2015   159  > 02/03/2016    163     10/10/14 160 lb (72.576 kg)  09/16/14  156 lb (70.761 kg)  09/04/14 160 lb 13.6 oz (72.96 kg)    Vital signs reviewed - - Note on arrival 02 sats  92% on 1pm continuous      HEENT: nl turbinates, and oropharynx. Nl external ear canals without cough reflex - upper and lower partials   NECK :  without JVD/Nodes/TM/ nl carotid upstrokes bilaterally   LUNGS: no acc muscle use,    CV:  RRR  no s3 or murmur or increase in P2, no edema   ABD:  soft and nontender with nl excursion in the supine position. No bruits or organomegaly, bowel sounds nl  MS:  warm without deformities, calf tenderness, cyanosis or clubbing  SKIN: warm and dry without lesions    NEURO:  alert, approp, no deficits     CXR PA and Lateral:   02/03/2016 :    I personally reviewed images and agree with radiology impression as follows:    Persistent opacity left mid lung region. Malignancy is a possibility and therefore CT recommended for further delineation     Assessment:

## 2016-02-03 NOTE — Patient Instructions (Addendum)
For itching/ sneezing runny nose take either zyrtec or clariton   No change in medications  Work on inhaler technique:  relax and gently blow all the way out then take a nice smooth deep breath back in, triggering the inhaler at same time you start breathing in.  Hold for up to 5 seconds if you can. Blow out thru nose. Rinse and gargle with water when done  Only use your albuterol as a rescue medication to be used if you can't catch your breath by resting or doing a relaxed purse lip breathing pattern.  - The less you use it, the better it will work when you need it. - Ok to use up to 2 puffs  every 4 hours if you must but call for immediate appointment if use goes up over your usual need - Don't leave home without it !!  (think of it like the spare tire for your car)      Please remember to go to the   x-ray department downstairs for your tests - we will call you with the results when they are available.     Please schedule a follow up visit in 3 months but call sooner if needed

## 2016-02-04 ENCOUNTER — Other Ambulatory Visit: Payer: Self-pay

## 2016-02-04 DIAGNOSIS — R918 Other nonspecific abnormal finding of lung field: Secondary | ICD-10-CM

## 2016-02-04 NOTE — Assessment & Plan Note (Signed)
Not present Sep 02 2015 See cxr 11/04/15 rx zpak > improved 11/19/2015 but not cleared   - no change 12/23/2015  > f/u 02/03/2016 still present > rec CT    Clearly not a surgical candidate though could prob tol RT if proved to be ca > Discussed in detail all the  indications, usual  risks and alternatives  relative to the benefits with patient who agrees to proceed with w/u as outlined

## 2016-02-04 NOTE — Assessment & Plan Note (Signed)
Started on 02 2lpm at d/c 09/04/14  - 10/28/2014   Walked RA x one lap @ 185 stopped due to  desat to 82%  at nl pace so rec 02 2lpm with walking and sleeping  - 12/09/2014 referred for POC > did not qualify - RA at rest 04/25/2015  = 94%    As of 02/03/2016  2lpm at hs  and 4-5lpm pulsed walking any distance  Adequate control on present rx, reviewed in detail with pt > no change in rx needed

## 2016-02-04 NOTE — Assessment & Plan Note (Signed)
Quit smoking 08/2014 - PFT's  06/20/2015  FEV1 0.75 (44 % ) ratio 62  p no % improvement from saba p ? prior to study with DLCO  24 % corrects to 48 % for alv volume    - 12/09/2014 referred to rehab > started 12/2014  - 04/25/2015   try BEVESPI  - 06/20/2015  Changed by insurance> back on symb 160  - 09/22/2015 not happy with symbicort > changed back to besvespi 2bid  - 02/03/2016  After extensive coaching HFA effectiveness =    75% (Ti too short)  Pt is Group B in terms of symptom/risk and laba/lama therefore appropriate rx at this point and bevespi good choice but needs to work harder on hfa as it is her maint and her rescue rx (exac same technique which is harder to use during flares due to air trapping leading to low IC)  I had an extended discussion with the patient reviewing all relevant studies completed to date and  lasting 15 to 20 minutes of a 25 minute visit    Each maintenance medication was reviewed in detail including most importantly the difference between maintenance and prns and under what circumstances the prns are to be triggered using an action plan format that is not reflected in the computer generated alphabetically organized AVS.    Please see AVS for specific instructions unique to this visit that I personally wrote and verbalized to the the pt in detail and then reviewed with pt  by my nurse highlighting any  changes in therapy recommended at today's visit to their plan of care.

## 2016-02-05 ENCOUNTER — Ambulatory Visit (INDEPENDENT_AMBULATORY_CARE_PROVIDER_SITE_OTHER)
Admission: RE | Admit: 2016-02-05 | Discharge: 2016-02-05 | Disposition: A | Discharge status: Home / Self Care | Payer: Medicare Other | Source: Ambulatory Visit | Attending: Internal Medicine | Admitting: Internal Medicine

## 2016-02-05 ENCOUNTER — Encounter (HOSPITAL_COMMUNITY): Payer: Self-pay | Admitting: Emergency Medicine

## 2016-02-05 ENCOUNTER — Ambulatory Visit (INDEPENDENT_AMBULATORY_CARE_PROVIDER_SITE_OTHER): Payer: Medicare Other

## 2016-02-05 ENCOUNTER — Ambulatory Visit (HOSPITAL_COMMUNITY)
Admission: EM | Admit: 2016-02-05 | Discharge: 2016-02-05 | Disposition: A | Discharge status: Home / Self Care | Payer: Medicare Other | Attending: Emergency Medicine | Admitting: Emergency Medicine

## 2016-02-05 DIAGNOSIS — S63616A Unspecified sprain of right little finger, initial encounter: Secondary | ICD-10-CM

## 2016-02-05 DIAGNOSIS — R918 Other nonspecific abnormal finding of lung field: Secondary | ICD-10-CM

## 2016-02-05 MED ORDER — NAPROXEN 250 MG PO TABS: 250.0000 mg | ORAL_TABLET | Freq: Two times a day (BID) | ORAL | 0 refills | Status: DC

## 2016-02-05 NOTE — ED Triage Notes (Signed)
Jammed right little finger into a wooden box.  Unable to move right little finger

## 2016-02-05 NOTE — ED Provider Notes (Signed)
CSN: [id number]     Arrival date & time 02/05/16  1629 History   First MD Initiated Contact with Patient 02/05/16 1735     Chief Complaint  Patient presents with  . Finger Injury   (Consider location/radiation/quality/duration/timing/severity/associated sxs/prior Treatment) Patient injured right little finger by jamming it into a wooden box and now is unable to move right little finger.   The history is provided by the patient.  Hand Pain  This is a new problem. The current episode started yesterday. The problem occurs constantly. The problem has not changed since onset.Nothing aggravates the symptoms. Nothing relieves the symptoms.    Past Medical History:  Diagnosis Date  . Bronchitis, chronic ([place])   . COPD (chronic obstructive pulmonary disease) ([place])   . Hypertension    Past Surgical History:  Procedure Laterality Date  . CHOLECYSTECTOMY     History reviewed. No pertinent family history. Social History  Substance Use Topics  . Smoking status: Former Smoker    Packs/day: 1.00    Years: 40.00    Types: Cigarettes    Quit date: 09/02/2014  . Smokeless tobacco: Never Used  . Alcohol use No   OB History    No data available     Review of Systems  Constitutional: Negative.   HENT: Negative.   Eyes: Negative.   Respiratory: Negative.   Cardiovascular: Negative.   Gastrointestinal: Negative.   Endocrine: Negative.   Genitourinary: Negative.   Musculoskeletal: Positive for arthralgias.  Allergic/Immunologic: Negative.   Neurological: Negative.   Hematological: Negative.   Psychiatric/Behavioral: Negative.     Allergies  Patient has no known allergies.  Home Medications   Prior to Admission medications   Medication Sig Start Date End Date Taking? Authorizing Provider  albuterol (PROAIR HFA) 108 (90 Base) MCG/ACT inhaler 2 puffs every 4 hours as needed only  if your can't catch your breath 12/23/15   Tanda Rockers, MD  albuterol (PROVENTIL) (2.5 MG/3ML)  0.083% nebulizer solution Take 3 mLs (2.5 mg total) by nebulization every 4 (four) hours as needed for wheezing or shortness of breath. 12/23/15   Tanda Rockers, MD  amLODipine (NORVASC) 5 MG tablet Take 1 tablet by mouth daily. 10/14/14   Historical Provider, MD  Glycopyrrolate-Formoterol (BEVESPI AEROSPHERE) 9-4.8 MCG/ACT AERO Inhale 2 puffs into the lungs 2 (two) times daily. 02/03/16   Tanda Rockers, MD  Guaifenesin [facility] MAXIMUM STRENGTH) 1200 MG TB12 Take 1 tablet by mouth 2 (two) times daily.    Historical Provider, MD  hydrochlorothiazide (MICROZIDE) 12.5 MG capsule Take 12.5 mg by mouth daily. 07/24/14   Historical Provider, MD  ibuprofen (ADVIL,MOTRIN) 200 MG tablet Take 200 mg by mouth every 6 (six) hours as needed.    Historical Provider, MD  naproxen (NAPROSYN) 250 MG tablet Take 1 tablet (250 mg total) by mouth 2 (two) times daily with a meal. 02/05/16   Lysbeth Penner, FNP  OXYGEN 2lpm with sleep and 4 lpm with exertion    Historical Provider, MD  sertraline (ZOLOFT) 100 MG tablet Take 100 mg by mouth 2 (two) times daily.    Historical Provider, MD   Meds Ordered and Administered this Visit  Medications - No data to display  BP 112/55 (BP Location: Right Arm)   Pulse 103   Temp 98.4 F (36.9 C) (Oral)   Resp 24   SpO2 95%  No data found.   Physical Exam  Constitutional: She appears well-developed and well-nourished.  HENT:  Head: Normocephalic  and atraumatic.  Eyes: Conjunctivae and EOM are normal. Pupils are equal, round, and reactive to light.  Neck: Normal range of motion. Neck supple.  Cardiovascular: Normal rate, regular rhythm and normal heart sounds.   Pulmonary/Chest: Effort normal and breath sounds normal.  Abdominal: Soft. Bowel sounds are normal.  Musculoskeletal: She exhibits tenderness.  Right 5th finger with swelling and tenderness and decreased ROM.  Nursing note and vitals reviewed.   Urgent Care Course   Clinical Course     Procedures  (including critical care time)  Labs Review Labs Reviewed - No data to display  Imaging Review Ct Chest Wo Contrast  Result Date: 02/05/2016 CLINICAL DATA:  Left lung nodule on chest radiograph.  COPD. EXAM: CT CHEST WITHOUT CONTRAST TECHNIQUE: Multidetector CT imaging of the chest was performed following the standard protocol without IV contrast. COMPARISON:  Chest radiograph on 02/03/2016 FINDINGS: Cardiovascular: No acute findings. Aortic and coronary artery atherosclerosis. Mediastinum/Nodes: No masses or pathologically enlarged lymph nodes identified on this unenhanced exam. Lungs/Pleura: Mild emphysema noted. A spiculated nodule is seen in the posterior inferior left upper lobe which measures 2.0 x 2.0 cm on image 60/3. This is highly suspicious for primary bronchogenic carcinoma. No other suspicious pulmonary nodules or masses identified. No evidence of pleural effusion. Upper Abdomen: Normal adrenal glands. Fluid attenuation cyst seen in upper pole of right kidney. Probable tiny cysts seen in superior left hepatic lobe. Musculoskeletal:  No suspicious bone lesions. IMPRESSION: 2.0 cm spiculated nodule in the left upper lobe, highly suspicious for primary bronchogenic carcinoma. Consider PET-CT scan for further evaluation. No definite thoracic lymphadenopathy on this unenhanced exam. No evidence of pleural effusion. Mild emphysema. Aortic and coronary artery atherosclerosis. Electronically Signed   By: Earle Gell M.D.   On: 02/05/2016 16:42   Dg Finger Little Right  Result Date: 02/05/2016 CLINICAL DATA:  Pain and swelling EXAM: RIGHT LITTLE FINGER 2+V COMPARISON:  None. FINDINGS: No fracture or subluxation. No periostitis or bone destruction. No radiopaque foreign body in the soft tissues. IMPRESSION: No acute osseous abnormality Electronically Signed   By: Donavan Foil M.D.   On: 02/05/2016 17:30     Visual Acuity Review  Right Eye Distance:   Left Eye Distance:   Bilateral Distance:     Right Eye Near:   Left Eye Near:    Bilateral Near:         MDM   1. Sprain of right little finger, unspecified site of finger, initial encounter    Buddy taped Naprosyn '250mg'$  one po bidx 10 days [place], [place] 02/05/16 1753

## 2016-02-06 ENCOUNTER — Other Ambulatory Visit: Payer: Self-pay | Admitting: Internal Medicine

## 2016-02-06 DIAGNOSIS — R918 Other nonspecific abnormal finding of lung field: Secondary | ICD-10-CM

## 2016-02-17 ENCOUNTER — Telehealth: Payer: Self-pay | Admitting: Internal Medicine

## 2016-02-17 MED ORDER — PREDNISONE 10 MG PO TABS: ORAL_TABLET | ORAL | 0 refills | Status: DC

## 2016-02-17 MED ORDER — AZITHROMYCIN 250 MG PO TABS: ORAL_TABLET | ORAL | 0 refills | Status: DC

## 2016-02-17 NOTE — Telephone Encounter (Signed)
Spoke with pt, aware of recs.  rx's sent to preferred pharmacy.  Nothing further needed.  

## 2016-02-17 NOTE — Telephone Encounter (Signed)
Pt spoke with pt, who c/o increased sob, wheezing, nasal congestion & mild prod cough with yellow mucus x 3d She is doing neb treatments q6h with mild improvement. Pt denies any fever, chills or sweats. Pt is requesting recommendations.  MW please advise. Thanks.   Patient Instructions    For itching/ sneezing runny nose take either zyrtec or clariton   No change in medications  Work on inhaler technique:  relax and gently blow all the way out then take a nice smooth deep breath back in, triggering the inhaler at same time you start breathing in.  Hold for up to 5 seconds if you can. Blow out thru nose. Rinse and gargle with water when done  Only use your albuterol as a rescue medication to be used if you can't catch your breath by resting or doing a relaxed purse lip breathing pattern.  - The less you use it, the better it will work when you need it. - Ok to use up to 2 puffs  every 4 hours if you must but call for immediate appointment if use goes up over your usual need - Don't leave home without it !!  (think of it like the spare tire for your car)      Please remember to go to the   x-ray department downstairs for your tests - we will call you with the results when they are available.     Please schedule a follow up visit in 3 months but call sooner if needed

## 2016-02-17 NOTE — Telephone Encounter (Signed)
zpak Prednisone 10 mg take  4 each am x 2 days,   2 each am x 2 days,  1 each am x 2 days and stop  

## 2016-02-20 ENCOUNTER — Ambulatory Visit (HOSPITAL_COMMUNITY)
Admission: RE | Admit: 2016-02-20 | Discharge: 2016-02-20 | Disposition: A | Discharge status: Home / Self Care | Payer: Medicare Other | Source: Ambulatory Visit | Attending: Internal Medicine | Admitting: Internal Medicine

## 2016-02-20 DIAGNOSIS — R911 Solitary pulmonary nodule: Secondary | ICD-10-CM | POA: Insufficient documentation

## 2016-02-20 DIAGNOSIS — R918 Other nonspecific abnormal finding of lung field: Secondary | ICD-10-CM

## 2016-02-20 LAB — GLUCOSE, CAPILLARY: GLUCOSE-CAPILLARY: 90 mg/dL (ref 65–99)

## 2016-02-20 MED ORDER — FLUDEOXYGLUCOSE F - 18 (FDG) INJECTION
8.0200 | Freq: Once | INTRAVENOUS | Status: AC | PRN
  Administered 2016-02-20: 8.02 via INTRAVENOUS

## 2016-02-24 ENCOUNTER — Encounter: Payer: Self-pay | Admitting: Internal Medicine

## 2016-02-24 NOTE — Progress Notes (Signed)
appt scheduled 2/27 at 4:30

## 2016-03-09 ENCOUNTER — Telehealth: Payer: Self-pay | Admitting: Internal Medicine

## 2016-03-09 NOTE — Telephone Encounter (Signed)
Pt. States she still had her printed rx was Nov. For her albuterol neb solution that she never got filled she was wanting it to go to [facility], informed the pt. How we usually do it is we take the printed rx and then fax it to [facility], She states of she knows where [facility] is and she states she will just go up there herself and take the rx and then call us if she needs [place]. Nothing further is needed at this time.

## 2016-03-23 ENCOUNTER — Encounter: Payer: Self-pay | Admitting: Emergency Medicine

## 2016-03-23 ENCOUNTER — Ambulatory Visit (INDEPENDENT_AMBULATORY_CARE_PROVIDER_SITE_OTHER): Payer: Medicare Other | Admitting: Emergency Medicine

## 2016-03-23 DIAGNOSIS — R911 Solitary pulmonary nodule: Secondary | ICD-10-CM

## 2016-03-23 NOTE — Assessment & Plan Note (Signed)
I discussed the CT scan of the chest in the PET scan with her in detail today. Reviewed the risks and benefits of all possible modes biopsy. I have explained why primary surgical resection is contraindicated. We will arrange for ENB to approach the lesion. She will have a superD CT chest. I would like to arrange for 03/31/16.

## 2016-03-23 NOTE — Patient Instructions (Addendum)
We will work on scheduling navigational bronchoscopy and lung biopsies, hopefully for 03/31/16.  We will perform a repeat CT chest  Our office will call you with the details about these procedures.

## 2016-03-23 NOTE — Progress Notes (Signed)
Subjective:    Patient ID: Suzanne Dickson, female    DOB: [birth year], 71 y.o.   MRN: [id number]  HPI 71 year old smoker (40 pack years) followed by Dr Melvyn Novas for COPD. Noted to have a 65 m per nodular left upper lobe on CT scan of the chest done on 02/05/16. After the PET scan on 02/20/16. I have reviewed all the films. This showed that the lesion is hypermetabolic is no evidence for definitive disease although there was an abnormality on the distal part of the tongue and also an indeterminate focus in the left skull base.  Her last pulmonary function testing was done on 12/14/14. Her FEV1 was 0.8 L which likely excludes her from surgical resection.    Review of Systems  Constitutional: Negative.  Negative for fever and unexpected weight change.  HENT: Negative.  Negative for congestion, dental problem, ear pain, nosebleeds, postnasal drip, rhinorrhea, sinus pressure, sneezing, sore throat and trouble swallowing.   Eyes: Positive for itching. Negative for redness.  Respiratory: Positive for wheezing. Negative for cough, chest tightness and shortness of breath.   Cardiovascular: Negative for palpitations and leg swelling.  Gastrointestinal: Negative.  Negative for nausea and vomiting.  Genitourinary: Negative.  Negative for dysuria.  Musculoskeletal: Negative.  Negative for joint swelling.  Skin: Negative.  Negative for rash.  Neurological: Negative.  Negative for headaches.  Hematological: Negative.  Does not bruise/bleed easily.  Psychiatric/Behavioral: Negative.  Negative for dysphoric mood. The patient is not nervous/anxious.    Past Medical History:  Diagnosis Date  . Bronchitis, chronic ([place])   . COPD (chronic obstructive pulmonary disease) ([place])   . Hypertension      No family history on file.   Social History   Social History  . Marital status: Married    Spouse name: N/A  . Number of children: N/A  . Years of education: N/A   Occupational History  . Not on file.    Social History Main Topics  . Smoking status: Former Smoker    Packs/day: 1.00    Years: 40.00    Types: Cigarettes    Quit date: 09/02/2014  . Smokeless tobacco: Never Used  . Alcohol use No  . Drug use: No  . Sexual activity: Yes   Other Topics Concern  . Not on file   Social History Narrative  . No narrative on file     No Known Allergies   Outpatient Medications Prior to Visit  Medication Sig Dispense Refill  . albuterol (PROAIR HFA) 108 (90 Base) MCG/ACT inhaler 2 puffs every 4 hours as needed only  if your can't catch your breath 1 Inhaler 11  . albuterol (PROVENTIL) (2.5 MG/3ML) 0.083% nebulizer solution Take 3 mLs (2.5 mg total) by nebulization every 4 (four) hours as needed for wheezing or shortness of breath. 75 mL 12  . amLODipine (NORVASC) 5 MG tablet Take 1 tablet by mouth daily.    . Glycopyrrolate-Formoterol (BEVESPI AEROSPHERE) 9-4.8 MCG/ACT AERO Inhale 2 puffs into the lungs 2 (two) times daily. 1 Inhaler 0  . Guaifenesin (MUCINEX MAXIMUM STRENGTH) 1200 MG TB12 Take 1 tablet by mouth 2 (two) times daily.    . hydrochlorothiazide (MICROZIDE) 12.5 MG capsule Take 12.5 mg by mouth daily.  0  . ibuprofen (ADVIL,MOTRIN) 200 MG tablet Take 200 mg by mouth every 6 (six) hours as needed.    . naproxen (NAPROSYN) 250 MG tablet Take 1 tablet (250 mg total) by mouth 2 (two) times daily with a meal.  20 tablet 0  . OXYGEN 2lpm with sleep and 4 lpm with exertion    . sertraline (ZOLOFT) 100 MG tablet Take 100 mg by mouth 2 (two) times daily.    Marland Kitchen azithromycin (ZITHROMAX) 250 MG tablet Take 2 today, then 1 daily until gone. (Patient not taking: Reported on 03/23/2016) 6 tablet 0  . predniSONE (DELTASONE) 10 MG tablet '40mg'$ X2 days, '20mg'$  X2 days, '10mg'$ X2 days, then stop. (Patient not taking: Reported on 03/23/2016) 14 tablet 0   No facility-administered medications prior to visit.         Objective:   Physical Exam Vitals:   03/23/16 1705  BP: 140/70  Pulse: 93  SpO2: 92%   Weight: 163 lb 3.2 oz (74 kg)  Height: '5\' 2"'$  (1.575 m)   Gen: Pleasant, well-nourished, in no distress,  normal affect on O2 by [place]  ENT: No lesions,  mouth clear,  oropharynx clear, no postnasal drip  Neck: No JVD, no TMG, no carotid bruits  Lungs: No use of accessory muscles, clear without rales or rhonchi  Cardiovascular: RRR, heart sounds normal, no murmur or gallops, no peripheral edema  Musculoskeletal: No deformities, no cyanosis or clubbing  Neuro: alert, non focal  Skin: Warm, no lesions or rashes     Assessment & Plan:  Solitary pulmonary nodule on lung CT I discussed the CT scan of the chest in the PET scan with her in detail today. Reviewed the risks and benefits of all possible modes biopsy. I have explained why primary surgical resection is contraindicated. We will arrange for ENB to approach the lesion. She will have a superD CT chest. I would like to arrange for 03/31/16.   Baltazar Apo, MD, PhD 03/23/2016, 5:50 PM [facility] [phone] or if no answer [phone]

## 2016-03-24 ENCOUNTER — Telehealth: Payer: Self-pay | Admitting: Internal Medicine

## 2016-03-24 MED ORDER — ALBUTEROL SULFATE (2.5 MG/3ML) 0.083% IN NEBU: 2.5000 mg | INHALATION_SOLUTION | RESPIRATORY_TRACT | 12 refills | Status: DC

## 2016-03-24 NOTE — Telephone Encounter (Signed)
Spoke with pt. She is needing a refill on albuterol nebulizer solution. This is to be sent to [facility] with [facility]. Rx has been sent in. Nothing further was needed.

## 2016-03-26 ENCOUNTER — Ambulatory Visit (INDEPENDENT_AMBULATORY_CARE_PROVIDER_SITE_OTHER)
Admission: RE | Admit: 2016-03-26 | Discharge: 2016-03-26 | Disposition: A | Discharge status: Home / Self Care | Payer: Medicare Other | Source: Ambulatory Visit | Attending: Emergency Medicine | Admitting: Emergency Medicine

## 2016-03-26 DIAGNOSIS — R911 Solitary pulmonary nodule: Secondary | ICD-10-CM

## 2016-03-29 ENCOUNTER — Encounter (HOSPITAL_COMMUNITY)
Admission: RE | Admit: 2016-03-29 | Discharge: 2016-03-29 | Disposition: A | Discharge status: Home / Self Care | Payer: Medicare Other | Source: Ambulatory Visit | Attending: Emergency Medicine | Admitting: Emergency Medicine

## 2016-03-29 ENCOUNTER — Encounter (HOSPITAL_COMMUNITY): Payer: Self-pay

## 2016-03-29 DIAGNOSIS — R0902 Hypoxemia: Secondary | ICD-10-CM | POA: Diagnosis not present

## 2016-03-29 DIAGNOSIS — Z01812 Encounter for preprocedural laboratory examination: Secondary | ICD-10-CM | POA: Insufficient documentation

## 2016-03-29 DIAGNOSIS — M1991 Primary osteoarthritis, unspecified site: Secondary | ICD-10-CM | POA: Diagnosis not present

## 2016-03-29 DIAGNOSIS — R911 Solitary pulmonary nodule: Secondary | ICD-10-CM | POA: Diagnosis present

## 2016-03-29 DIAGNOSIS — I739 Peripheral vascular disease, unspecified: Secondary | ICD-10-CM | POA: Diagnosis not present

## 2016-03-29 DIAGNOSIS — C3412 Malignant neoplasm of upper lobe, left bronchus or lung: Secondary | ICD-10-CM | POA: Diagnosis not present

## 2016-03-29 DIAGNOSIS — I700 Atherosclerosis of aorta: Secondary | ICD-10-CM | POA: Diagnosis not present

## 2016-03-29 DIAGNOSIS — J449 Chronic obstructive pulmonary disease, unspecified: Secondary | ICD-10-CM | POA: Diagnosis not present

## 2016-03-29 DIAGNOSIS — Z9889 Other specified postprocedural states: Secondary | ICD-10-CM

## 2016-03-29 DIAGNOSIS — F329 Major depressive disorder, single episode, unspecified: Secondary | ICD-10-CM | POA: Diagnosis not present

## 2016-03-29 DIAGNOSIS — Z9981 Dependence on supplemental oxygen: Secondary | ICD-10-CM | POA: Diagnosis not present

## 2016-03-29 DIAGNOSIS — F419 Anxiety disorder, unspecified: Secondary | ICD-10-CM | POA: Diagnosis not present

## 2016-03-29 DIAGNOSIS — I10 Essential (primary) hypertension: Secondary | ICD-10-CM | POA: Diagnosis not present

## 2016-03-29 DIAGNOSIS — K219 Gastro-esophageal reflux disease without esophagitis: Secondary | ICD-10-CM | POA: Diagnosis not present

## 2016-03-29 DIAGNOSIS — Z7951 Long term (current) use of inhaled steroids: Secondary | ICD-10-CM | POA: Diagnosis not present

## 2016-03-29 DIAGNOSIS — Z79899 Other long term (current) drug therapy: Secondary | ICD-10-CM | POA: Diagnosis not present

## 2016-03-29 DIAGNOSIS — Z791 Long term (current) use of non-steroidal anti-inflammatories (NSAID): Secondary | ICD-10-CM | POA: Diagnosis not present

## 2016-03-29 HISTORY — DX: Major depressive disorder, single episode, unspecified: F32.9

## 2016-03-29 HISTORY — DX: Pneumonia, unspecified organism: J18.9

## 2016-03-29 HISTORY — DX: Personal history of other medical treatment: Z92.89

## 2016-03-29 HISTORY — DX: Depression, unspecified: F32.A

## 2016-03-29 HISTORY — DX: Unspecified osteoarthritis, unspecified site: M19.90

## 2016-03-29 HISTORY — DX: Gastro-esophageal reflux disease without esophagitis: K21.9

## 2016-03-29 HISTORY — DX: Dyspnea, unspecified: R06.00

## 2016-03-29 HISTORY — DX: Anxiety disorder, unspecified: F41.9

## 2016-03-29 LAB — COMPREHENSIVE METABOLIC PANEL
ALBUMIN: 4.4 g/dL (ref 3.5–5.0)
ALT: 16 U/L (ref 14–54)
ANION GAP: 8 (ref 5–15)
AST: 24 U/L (ref 15–41)
Alkaline Phosphatase: 49 U/L (ref 38–126)
BUN: 16 mg/dL (ref 6–20)
CHLORIDE: 104 mmol/L (ref 101–111)
CO2: 27 mmol/L (ref 22–32)
Calcium: 9.5 mg/dL (ref 8.9–10.3)
Creatinine, Ser: 1.15 mg/dL — ABNORMAL HIGH (ref 0.44–1.00)
GFR calc Af Amer: 55 mL/min — ABNORMAL LOW (ref >60)
GFR calc non Af Amer: 47 mL/min — ABNORMAL LOW (ref >60)
GLUCOSE: 101 mg/dL — AB (ref 65–99)
POTASSIUM: 3.7 mmol/L (ref 3.5–5.1)
SODIUM: 139 mmol/L (ref 135–145)
Total Bilirubin: 0.6 mg/dL (ref 0.3–1.2)
Total Protein: 8.2 g/dL — ABNORMAL HIGH (ref 6.5–8.1)

## 2016-03-29 LAB — CBC
HCT: 33.2 % — ABNORMAL LOW (ref 36.0–46.0)
HEMOGLOBIN: 9.7 g/dL — AB (ref 12.0–15.0)
MCH: 24.4 pg — ABNORMAL LOW (ref 26.0–34.0)
MCHC: 29.2 g/dL — AB (ref 30.0–36.0)
MCV: 83.4 fL (ref 78.0–100.0)
PLATELETS: 211 10*3/uL (ref 150–400)
RBC: 3.98 MIL/uL (ref 3.87–5.11)
RDW: 13.8 % (ref 11.5–15.5)
WBC: 5.9 10*3/uL (ref 4.0–10.5)

## 2016-03-29 LAB — PROTIME-INR
INR: 1.16
Prothrombin Time: 14.9 seconds (ref 11.4–15.2)

## 2016-03-29 LAB — APTT: APTT: 30 s (ref 24–36)

## 2016-03-29 NOTE — Pre-Procedure Instructions (Signed)
    Suzanne Dickson  03/29/2016    Your procedure is scheduled on Wednesday, March 5.  Report to [facility] Admitting at 6:30 AM                 Your surgery or procedure is scheduled for 8:30 AM   Call this number if you have problems the morning of surgery: [phone]     Remember:  Do not eat food or drink liquids after midnight Tuesday, March 6.   Take these medicines the morning of surgery with A SIP OF WATER: amLODipine (NORVASC),  pantoprazole (PROTONIX), sertraline (ZOLOFT).                       May use Inhalers, Nebuliwer, bring Albuterol Inhaler to the hospital with you.                 STOP taking Aspirin, Aspirin Products (Goody Powder, Excedrin Migraine), Ibuprofen (Advil), Naproxen (Aleve), Vitamins and Herbal Products (ie Fish Oil)   Do not wear jewelry, make-up or nail polish.  Do not wear lotions, powders, or perfumes, or deodorant.  Do not shave 48 hours prior to surgery.  Men may shave face and neck.  Do not bring valuables to the hospital.  [facility] is not responsible for any belongings or valuables.  Contacts, dentures or bridgework may not be worn into surgery.  Leave your suitcase in the car.  After surgery it may be brought to your room.  For patients admitted to the hospital, discharge time will be determined by your treatment team.  Patients discharged the day of surgery will not be allowed to drive home.   Name and phone number of your driver:  -  Special instructions: Review  [place] - Preparing For Surgery: [facility]- Preparing For Surgery, Pain Booklet, Coughing and Deep Breathing

## 2016-03-30 ENCOUNTER — Other Ambulatory Visit: Payer: Self-pay | Admitting: Internal Medicine

## 2016-03-30 ENCOUNTER — Encounter (HOSPITAL_COMMUNITY): Payer: Self-pay | Admitting: Anesthesiology

## 2016-03-30 NOTE — Anesthesia Preprocedure Evaluation (Addendum)
Anesthesia Evaluation  Patient identified by MRN, date of birth, ID band Patient awake    Reviewed: Allergy & Precautions, NPO status , Patient's Chart, lab work & pertinent test results  Airway Mallampati: II  TM Distance: >3 FB Neck ROM: Full    Dental  (+) Partial Upper, Partial Lower, Missing   Pulmonary shortness of breath and with exertion, pneumonia, resolved, COPD,  COPD inhaler, former smoker,  LUL pulmonary nodule   breath sounds clear to auscultation + decreased breath sounds      Cardiovascular hypertension, Pt. on medications + Peripheral Vascular Disease  Normal cardiovascular exam Rhythm:Regular Rate:Normal     Neuro/Psych PSYCHIATRIC DISORDERS Anxiety Depression    GI/Hepatic Neg liver ROS, GERD  Medicated and Controlled,  Endo/Other  negative endocrine ROS  Renal/GU negative Renal ROS  negative genitourinary   Musculoskeletal  (+) Arthritis , Osteoarthritis,    Abdominal (+) - obese,   Peds  Hematology negative hematology ROS (+)   Anesthesia Other Findings   Reproductive/Obstetrics                           Anesthesia Physical Anesthesia Plan  ASA: III  Anesthesia Plan: General   Post-op Pain Management:    Induction: Intravenous  Airway Management Planned: Oral ETT  Additional Equipment:   Intra-op Plan:   Post-operative Plan: Extubation in OR  Informed Consent: I have reviewed the patients History and Physical, chart, labs and discussed the procedure including the risks, benefits and alternatives for the proposed anesthesia with the patient or authorized representative who has indicated his/her understanding and acceptance.   Dental advisory given  Plan Discussed with: Anesthesiologist, CRNA and Surgeon  Anesthesia Plan Comments:        Anesthesia Quick Evaluation

## 2016-03-30 NOTE — Progress Notes (Signed)
Anesthesia chart review: Patient is a 71 year old female scheduled for video bronchoscopy with electromagnetic navigation and biopsies on 03/31/2016 by Dr. Lamonte Sakai. She has a hypermetabolic spiculated posterior LUL nodule. There was also two neck and one left skull base abnormalities of undetermined significance, recommend CT or MRI (per radiologist). At this point, she is not felt to be a candidate for surgical lung resection due to pulmonary status. Biopsy is needed to confirm diagnosis.  History includes former smoker (quit 09/02/14), HTN, COPD (Gold III)/chronic bronchitis, home oxygen, exertional dyspnea, anxiety, depression, GERD, arthritis, cholecystectomy. She has findings of mild to moderate AR/TR and moderate pulmonary hypertension on 05/2014 echo.   PCP is Dr. Montez Morita, last visit 01/06/16. Pulmonologist is Dr. Christinia Gully.  Meds include albuterol, amlodipine, aspirin 81 mg, Pepcid, HCTZ, Protonix, Sominex, Bevespi Aerosphere, Zoloft.  BP 133/62   Pulse 73   Temp 36.7 C   Resp 20   Ht '5\' 2"'$  (1.575 m)   Wt 163 lb (73.9 kg)   SpO2 92%   BMI 29.81 kg/m   Echo 06/12/14: Study Conclusions - Left ventricle: The cavity size was normal. Wall thickness was   normal. Systolic function was normal. The estimated ejection   fraction was in the range of 55% to 60%. Wall motion was normal;   there were no regional wall motion abnormalities. Doppler   parameters are consistent with abnormal left ventricular   relaxation (grade 1 diastolic dysfunction). - Aortic valve: There was mild to moderate regurgitation. - Tricuspid valve: There was mild-moderate regurgitation. - Pulmonary arteries: Systolic pressure was mildly increased. PA   peak pressure: 43 mm Hg (S).  CXR 02/03/16: IMPRESSION: Persistent opacity left mid lung region. Malignancy is a possibility and therefore CT recommended for further delineation. Aortic atherosclerosis.  CT Super D Chest 03/26/16: IMPRESSION: 1. No substantial  interval change in the 2 cm spiculated posterior left upper lobe pulmonary nodule. 2.  Emphysema. (ICD10-J43.9) 3.  Abdominal Aortic Atherosclerois (ICD10-170.0)  Walk tests/O2 therapy (as outlined in 02/04/16 note by Dr. Melvyn Novas): Started on 02 2lpm at d/c 09/04/14  - 10/28/2014   Walked RA x one lap @ 185 stopped due to  desat to 82%  at nl pace so rec 02 2lpm with walking and sleeping  - 12/09/2014 referred for POC > did not qualify - RA at rest 04/25/2015  = 94%  As of 02/03/2016  2lpm at hs  and 4-5lpm pulsed walking any distance  PFT's  12/09/14: FVC 1.29 (59%), FEV1 0.80 (47%), DLCOunc 5.18 (24%).   Preoperative labs noted. Cr 1.15. H/H 9.7/33.2. There are no recent comparison labs in Epic or at Dr. Tonita [place] office. (H/H in 08/2014 were WNL.) I routed CBC results to Dr. Montez Morita for follow-up purposes. Defer decision for pre-operative T&S to her anesthesiologist.   EKG was requested from Dr. Montez Morita, but no recent EKG was on file there. She will need an EKG on the day of surgery. Last echo within the past two years.   Further evaluation by her anesthesiologist on the day of surgery to discuss definitive anesthesia plan.  George [facility] Short Stay Center/Anesthesiology Phone [phone] 03/30/2016 10:57 AM

## 2016-03-31 ENCOUNTER — Ambulatory Visit (HOSPITAL_COMMUNITY): Payer: Medicare Other

## 2016-03-31 ENCOUNTER — Ambulatory Visit (HOSPITAL_COMMUNITY): Payer: Medicare Other | Admitting: Anesthesiology

## 2016-03-31 ENCOUNTER — Encounter (HOSPITAL_COMMUNITY)
Admission: RE | Disposition: A | Discharge status: Home / Self Care | Payer: Self-pay | Source: Ambulatory Visit | Attending: Emergency Medicine

## 2016-03-31 ENCOUNTER — Encounter (HOSPITAL_COMMUNITY): Payer: Self-pay | Admitting: Urology

## 2016-03-31 ENCOUNTER — Ambulatory Visit (HOSPITAL_COMMUNITY): Payer: Medicare Other | Admitting: Vascular Surgery

## 2016-03-31 ENCOUNTER — Ambulatory Visit (HOSPITAL_COMMUNITY)
Admission: RE | Admit: 2016-03-31 | Discharge: 2016-03-31 | Disposition: A | Discharge status: Home / Self Care | Payer: Medicare Other | Source: Ambulatory Visit | Attending: Emergency Medicine | Admitting: Emergency Medicine

## 2016-03-31 DIAGNOSIS — C3412 Malignant neoplasm of upper lobe, left bronchus or lung: Secondary | ICD-10-CM | POA: Diagnosis not present

## 2016-03-31 DIAGNOSIS — Z419 Encounter for procedure for purposes other than remedying health state, unspecified: Secondary | ICD-10-CM

## 2016-03-31 DIAGNOSIS — J449 Chronic obstructive pulmonary disease, unspecified: Secondary | ICD-10-CM | POA: Diagnosis not present

## 2016-03-31 DIAGNOSIS — F419 Anxiety disorder, unspecified: Secondary | ICD-10-CM | POA: Insufficient documentation

## 2016-03-31 DIAGNOSIS — R0902 Hypoxemia: Secondary | ICD-10-CM | POA: Insufficient documentation

## 2016-03-31 DIAGNOSIS — I10 Essential (primary) hypertension: Secondary | ICD-10-CM | POA: Insufficient documentation

## 2016-03-31 DIAGNOSIS — R911 Solitary pulmonary nodule: Secondary | ICD-10-CM | POA: Diagnosis not present

## 2016-03-31 DIAGNOSIS — F329 Major depressive disorder, single episode, unspecified: Secondary | ICD-10-CM | POA: Insufficient documentation

## 2016-03-31 DIAGNOSIS — Z7951 Long term (current) use of inhaled steroids: Secondary | ICD-10-CM | POA: Insufficient documentation

## 2016-03-31 DIAGNOSIS — Z9981 Dependence on supplemental oxygen: Secondary | ICD-10-CM | POA: Insufficient documentation

## 2016-03-31 DIAGNOSIS — I739 Peripheral vascular disease, unspecified: Secondary | ICD-10-CM | POA: Insufficient documentation

## 2016-03-31 DIAGNOSIS — Z9889 Other specified postprocedural states: Secondary | ICD-10-CM

## 2016-03-31 DIAGNOSIS — M1991 Primary osteoarthritis, unspecified site: Secondary | ICD-10-CM | POA: Insufficient documentation

## 2016-03-31 DIAGNOSIS — K219 Gastro-esophageal reflux disease without esophagitis: Secondary | ICD-10-CM | POA: Insufficient documentation

## 2016-03-31 DIAGNOSIS — Z79899 Other long term (current) drug therapy: Secondary | ICD-10-CM | POA: Insufficient documentation

## 2016-03-31 DIAGNOSIS — I700 Atherosclerosis of aorta: Secondary | ICD-10-CM | POA: Insufficient documentation

## 2016-03-31 DIAGNOSIS — Z01818 Encounter for other preprocedural examination: Secondary | ICD-10-CM

## 2016-03-31 DIAGNOSIS — Z791 Long term (current) use of non-steroidal anti-inflammatories (NSAID): Secondary | ICD-10-CM | POA: Insufficient documentation

## 2016-03-31 HISTORY — PX: VIDEO BRONCHOSCOPY WITH ENDOBRONCHIAL NAVIGATION: SHX6175

## 2016-03-31 HISTORY — PX: FUDUCIAL PLACEMENT: SHX5083

## 2016-03-31 SURGERY — VIDEO BRONCHOSCOPY WITH ENDOBRONCHIAL NAVIGATION
Anesthesia: General

## 2016-03-31 MED ORDER — 0.9 % SODIUM CHLORIDE (POUR BTL) OPTIME
TOPICAL | Status: DC | PRN
  Administered 2016-03-31: 1000 mL

## 2016-03-31 MED ORDER — ALBUTEROL SULFATE (2.5 MG/3ML) 0.083% IN NEBU: 2.5000 mg | INHALATION_SOLUTION | RESPIRATORY_TRACT | Status: DC | PRN

## 2016-03-31 MED ORDER — FENTANYL CITRATE (PF) 100 MCG/2ML IJ SOLN
INTRAMUSCULAR | Status: AC
  Filled 2016-03-31: qty 4

## 2016-03-31 MED ORDER — FENTANYL CITRATE (PF) 100 MCG/2ML IJ SOLN
INTRAMUSCULAR | Status: DC | PRN
  Administered 2016-03-31: 100 ug via INTRAVENOUS

## 2016-03-31 MED ORDER — FENTANYL CITRATE (PF) 100 MCG/2ML IJ SOLN: 25.0000 ug | INTRAMUSCULAR | Status: DC | PRN

## 2016-03-31 MED ORDER — ONDANSETRON HCL 4 MG/2ML IJ SOLN
INTRAMUSCULAR | Status: AC
  Filled 2016-03-31: qty 2

## 2016-03-31 MED ORDER — SUGAMMADEX SODIUM 200 MG/2ML IV SOLN
INTRAVENOUS | Status: AC
  Filled 2016-03-31: qty 2

## 2016-03-31 MED ORDER — ALBUTEROL SULFATE (2.5 MG/3ML) 0.083% IN NEBU
2.5000 mg | INHALATION_SOLUTION | Freq: Four times a day (QID) | RESPIRATORY_TRACT | Status: DC | PRN
  Administered 2016-03-31: 2.5 mg via RESPIRATORY_TRACT

## 2016-03-31 MED ORDER — ROCURONIUM BROMIDE 50 MG/5ML IV SOSY
PREFILLED_SYRINGE | INTRAVENOUS | Status: AC
  Filled 2016-03-31: qty 5

## 2016-03-31 MED ORDER — ONDANSETRON HCL 4 MG/2ML IJ SOLN
INTRAMUSCULAR | Status: DC | PRN
  Administered 2016-03-31: 4 mg via INTRAVENOUS

## 2016-03-31 MED ORDER — LIDOCAINE HCL (CARDIAC) 20 MG/ML IV SOLN
INTRAVENOUS | Status: DC | PRN
  Administered 2016-03-31: 30 mg via INTRAVENOUS

## 2016-03-31 MED ORDER — LIDOCAINE 2% (20 MG/ML) 5 ML SYRINGE
INTRAMUSCULAR | Status: AC
  Filled 2016-03-31: qty 5

## 2016-03-31 MED ORDER — MIDAZOLAM HCL 2 MG/2ML IJ SOLN
INTRAMUSCULAR | Status: AC
  Filled 2016-03-31: qty 2

## 2016-03-31 MED ORDER — PHENYLEPHRINE HCL 10 MG/ML IJ SOLN
INTRAVENOUS | Status: DC | PRN
  Administered 2016-03-31: 50 ug/min via INTRAVENOUS

## 2016-03-31 MED ORDER — MEPERIDINE HCL 25 MG/ML IJ SOLN: 6.2500 mg | INTRAMUSCULAR | Status: DC | PRN

## 2016-03-31 MED ORDER — PROPOFOL 10 MG/ML IV BOLUS
INTRAVENOUS | Status: AC
Start: 2016-03-31 — End: 2016-03-31
  Filled 2016-03-31: qty 40

## 2016-03-31 MED ORDER — PROPOFOL 10 MG/ML IV BOLUS
INTRAVENOUS | Status: DC | PRN
  Administered 2016-03-31: 150 mg via INTRAVENOUS

## 2016-03-31 MED ORDER — ALBUTEROL SULFATE HFA 108 (90 BASE) MCG/ACT IN AERS: 2.0000 | INHALATION_SPRAY | RESPIRATORY_TRACT | Status: DC | PRN

## 2016-03-31 MED ORDER — ROCURONIUM BROMIDE 100 MG/10ML IV SOLN
INTRAVENOUS | Status: DC | PRN
  Administered 2016-03-31: 10 mg via INTRAVENOUS
  Administered 2016-03-31: 40 mg via INTRAVENOUS

## 2016-03-31 MED ORDER — METOCLOPRAMIDE HCL 5 MG/ML IJ SOLN: 10.0000 mg | Freq: Once | INTRAMUSCULAR | Status: DC | PRN

## 2016-03-31 MED ORDER — MIDAZOLAM HCL 2 MG/2ML IJ SOLN
INTRAMUSCULAR | Status: DC | PRN
  Administered 2016-03-31: 2 mg via INTRAVENOUS

## 2016-03-31 MED ORDER — ARTIFICIAL TEARS OP OINT
TOPICAL_OINTMENT | OPHTHALMIC | Status: AC
  Filled 2016-03-31: qty 3.5

## 2016-03-31 MED ORDER — ALBUTEROL SULFATE (2.5 MG/3ML) 0.083% IN NEBU
INHALATION_SOLUTION | RESPIRATORY_TRACT | Status: AC
  Filled 2016-03-31: qty 3

## 2016-03-31 MED ORDER — LACTATED RINGERS IV SOLN
INTRAVENOUS | Status: DC | PRN
  Administered 2016-03-31 (×2): via INTRAVENOUS

## 2016-03-31 MED ORDER — SUGAMMADEX SODIUM 200 MG/2ML IV SOLN
INTRAVENOUS | Status: DC | PRN
  Administered 2016-03-31: 147.8 mg via INTRAVENOUS

## 2016-03-31 SURGICAL SUPPLY — 40 items
ADAPTER BRONCH F/PENTAX (ADAPTER) ×4 IMPLANT
ADPR BSCP EDG PNTX (ADAPTER) ×2
BRUSH CYTOL CELLEBRITY 1.5X140 (MISCELLANEOUS) ×4 IMPLANT
BRUSH SUPERTRAX BIOPSY (INSTRUMENTS) IMPLANT
BRUSH SUPERTRAX NDL-TIP CYTO (INSTRUMENTS) ×4 IMPLANT
CANISTER SUCT 3000ML PPV (MISCELLANEOUS) ×4 IMPLANT
CHANNEL WORK EXTEND EDGE 180 (KITS) IMPLANT
CHANNEL WORK EXTEND EDGE 45 (KITS) IMPLANT
CHANNEL WORK EXTEND EDGE 90 (KITS) IMPLANT
CONT SPEC 4OZ CLIKSEAL STRL BL (MISCELLANEOUS) ×4 IMPLANT
COVER BACK TABLE 60X90IN (DRAPES) ×4 IMPLANT
FILTER STRAW FLUID ASPIR (MISCELLANEOUS) IMPLANT
FORCEPS BIOP SUPERTRX PREMAR (INSTRUMENTS) ×4 IMPLANT
GAUZE SPONGE 4X4 12PLY STRL (GAUZE/BANDAGES/DRESSINGS) ×4 IMPLANT
GLOVE BIO SURGEON STRL SZ7.5 (GLOVE) ×8 IMPLANT
GOWN STRL REUS W/ TWL LRG LVL3 (GOWN DISPOSABLE) ×2 IMPLANT
GOWN STRL REUS W/TWL LRG LVL3 (GOWN DISPOSABLE) ×4
KIT CLEAN ENDO COMPLIANCE (KITS) ×4 IMPLANT
KIT LOCATABLE GUIDE (CANNULA) IMPLANT
KIT MARKER FIDUCIAL DELIVERY (KITS) ×2 IMPLANT
KIT PROCEDURE EDGE 180 (KITS) ×2 IMPLANT
KIT PROCEDURE EDGE 45 (KITS) IMPLANT
KIT PROCEDURE EDGE 90 (KITS) IMPLANT
KIT ROOM TURNOVER OR (KITS) ×4 IMPLANT
MARKER FIDUCIAL SL NIT COIL (Implant Marker) ×6 IMPLANT
MARKER SKIN DUAL TIP RULER LAB (MISCELLANEOUS) ×4 IMPLANT
NDL SUPERTRX PREMARK BIOPSY (NEEDLE) ×2 IMPLANT
NEEDLE SUPERTRX PREMARK BIOPSY (NEEDLE) ×4 IMPLANT
NS IRRIG 1000ML POUR BTL (IV SOLUTION) ×4 IMPLANT
OIL SILICONE PENTAX (PARTS (SERVICE/REPAIRS)) ×4 IMPLANT
PAD ARMBOARD 7.5X6 YLW CONV (MISCELLANEOUS) ×8 IMPLANT
PATCHES PATIENT (LABEL) ×4 IMPLANT
SYR 20CC LL (SYRINGE) ×4 IMPLANT
SYR 20ML ECCENTRIC (SYRINGE) ×4 IMPLANT
SYR 50ML SLIP (SYRINGE) ×4 IMPLANT
TOWEL OR 17X24 6PK STRL BLUE (TOWEL DISPOSABLE) ×4 IMPLANT
TRAP SPECIMEN MUCOUS 40CC (MISCELLANEOUS) IMPLANT
TUBE CONNECTING 20'X1/4 (TUBING) ×1
TUBE CONNECTING 20X1/4 (TUBING) ×3 IMPLANT
WATER STERILE IRR 1000ML POUR (IV SOLUTION) ×4 IMPLANT

## 2016-03-31 NOTE — Anesthesia Procedure Notes (Signed)
Procedure Name: Intubation Date/Time: 03/31/2016 8:42 AM Performed by: Eligha Bridegroom Pre-anesthesia Checklist: Patient identified, Emergency Drugs available, Suction available, Patient being monitored and Timeout performed Patient Re-evaluated:Patient Re-evaluated prior to inductionOxygen Delivery Method: Circle system utilized Preoxygenation: Pre-oxygenation with 100% oxygen Intubation Type: IV induction Ventilation: Oral airway inserted - appropriate to patient size Laryngoscope Size: Mac and 4 Grade View: Grade I Tube type: Oral Tube size: 8.0 mm Airway Equipment and Method: Stylet and LTA kit utilized Placement Confirmation: ETT inserted through vocal cords under direct vision,  positive ETCO2 and breath sounds checked- equal and bilateral Secured at: 21 cm Tube secured with: Tape Dental Injury: Teeth and Oropharynx as per pre-operative assessment

## 2016-03-31 NOTE — Anesthesia Postprocedure Evaluation (Signed)
Anesthesia Post Note  Patient: Suzanne Dickson  Procedure(s) Performed: Procedure(s) (LRB): VIDEO BRONCHOSCOPY WITH ENDOBRONCHIAL NAVIGATION (N/A) PLACEMENT OF FUDUCIAL LEFT UPPER LOBE (Left)  Patient location during evaluation: PACU Anesthesia Type: General Level of consciousness: awake and alert and oriented Pain management: pain level controlled Vital Signs Assessment: post-procedure vital signs reviewed and stable Respiratory status: spontaneous breathing, nonlabored ventilation and respiratory function stable Cardiovascular status: blood pressure returned to baseline and stable Postop Assessment: no signs of nausea or vomiting Anesthetic complications: no       Last Vitals:  Vitals:   03/31/16 1145 03/31/16 1149  BP: 106/69   Pulse: 86   Resp: 18   Temp:  36.6 C    Last Pain:  Vitals:   03/31/16 1030  TempSrc:   PainSc: 0-No pain                 [name] A.

## 2016-03-31 NOTE — Interval H&P Note (Signed)
PCCM Interval Note  Pt follows today for further eval of her hypermetabolic LUL nodule. She has COPD, chronic hypoxemia. She reports stable dyspnea, has been compliant w her O2. Occasional wheezing, no cough  Vitals:   03/31/16 0632  BP: (!) 147/71  Pulse: 98  Resp: 18  Temp: 97.6 F (36.4 C)  TempSrc: Oral  SpO2: 94%  Weight: 73.9 kg (163 lb)   Gen: Pleasant, elderly woman on O2, well-nourished, in no distress,  normal affect  ENT: No lesions,  mouth clear,  oropharynx clear, no postnasal drip  Neck: No JVD, no stridor  Lungs: No use of accessory muscles, very distant, no wheezing  Cardiovascular: RRR, heart sounds normal, no murmur or gallops, no peripheral edema  Abdomen: soft and NT, no HSM,  BS normal  Musculoskeletal: No deformities, no cyanosis or clubbing  Neuro: alert, non focal  Skin: Warm, no lesions or rashes   Plan:  Navigation bronchoscopy to obtain tissue dx. She is at increased risk for anesthesia due to her COPD, hypoxemia. She is not a surgical resection candidate. Will plan to place fiducial markers in the event that she could have SBRT. Risks and benefits discussed with pt, all questions answered. She agrees to proceed.   Baltazar Apo, MD, PhD 03/31/2016, 8:26 AM [place] [phone] or if no answer [phone]

## 2016-03-31 NOTE — H&P (View-Only) (Signed)
Subjective:    Patient ID: Suzanne Dickson, female    DOB: [birth year], 71 y.o.   MRN: [id number]  HPI 72 year old smoker (40 pack years) followed by Dr Melvyn Novas for COPD. Noted to have a 67 m per nodular left upper lobe on CT scan of the chest done on 02/05/16. After the PET scan on 02/20/16. I have reviewed all the films. This showed that the lesion is hypermetabolic is no evidence for definitive disease although there was an abnormality on the distal part of the tongue and also an indeterminate focus in the left skull base.  Her last pulmonary function testing was done on 12/14/14. Her FEV1 was 0.8 L which likely excludes her from surgical resection.    Review of Systems  Constitutional: Negative.  Negative for fever and unexpected weight change.  HENT: Negative.  Negative for congestion, dental problem, ear pain, nosebleeds, postnasal drip, rhinorrhea, sinus pressure, sneezing, sore throat and trouble swallowing.   Eyes: Positive for itching. Negative for redness.  Respiratory: Positive for wheezing. Negative for cough, chest tightness and shortness of breath.   Cardiovascular: Negative for palpitations and leg swelling.  Gastrointestinal: Negative.  Negative for nausea and vomiting.  Genitourinary: Negative.  Negative for dysuria.  Musculoskeletal: Negative.  Negative for joint swelling.  Skin: Negative.  Negative for rash.  Neurological: Negative.  Negative for headaches.  Hematological: Negative.  Does not bruise/bleed easily.  Psychiatric/Behavioral: Negative.  Negative for dysphoric mood. The patient is not nervous/anxious.    Past Medical History:  Diagnosis Date  . Bronchitis, chronic ([place])   . COPD (chronic obstructive pulmonary disease) ([place])   . Hypertension      No family history on file.   Social History   Social History  . Marital status: Married    Spouse name: N/A  . Number of children: N/A  . Years of education: N/A   Occupational History  . Not on file.    Social History Main Topics  . Smoking status: Former Smoker    Packs/day: 1.00    Years: 40.00    Types: Cigarettes    Quit date: 09/02/2014  . Smokeless tobacco: Never Used  . Alcohol use No  . Drug use: No  . Sexual activity: Yes   Other Topics Concern  . Not on file   Social History Narrative  . No narrative on file     No Known Allergies   Outpatient Medications Prior to Visit  Medication Sig Dispense Refill  . albuterol (PROAIR HFA) 108 (90 Base) MCG/ACT inhaler 2 puffs every 4 hours as needed only  if your can't catch your breath 1 Inhaler 11  . albuterol (PROVENTIL) (2.5 MG/3ML) 0.083% nebulizer solution Take 3 mLs (2.5 mg total) by nebulization every 4 (four) hours as needed for wheezing or shortness of breath. 75 mL 12  . amLODipine (NORVASC) 5 MG tablet Take 1 tablet by mouth daily.    . Glycopyrrolate-Formoterol (BEVESPI AEROSPHERE) 9-4.8 MCG/ACT AERO Inhale 2 puffs into the lungs 2 (two) times daily. 1 Inhaler 0  . Guaifenesin (MUCINEX MAXIMUM STRENGTH) 1200 MG TB12 Take 1 tablet by mouth 2 (two) times daily.    . hydrochlorothiazide (MICROZIDE) 12.5 MG capsule Take 12.5 mg by mouth daily.  0  . ibuprofen (ADVIL,MOTRIN) 200 MG tablet Take 200 mg by mouth every 6 (six) hours as needed.    . naproxen (NAPROSYN) 250 MG tablet Take 1 tablet (250 mg total) by mouth 2 (two) times daily with a meal.  20 tablet 0  . OXYGEN 2lpm with sleep and 4 lpm with exertion    . sertraline (ZOLOFT) 100 MG tablet Take 100 mg by mouth 2 (two) times daily.    Marland Kitchen azithromycin (ZITHROMAX) 250 MG tablet Take 2 today, then 1 daily until gone. (Patient not taking: Reported on 03/23/2016) 6 tablet 0  . predniSONE (DELTASONE) 10 MG tablet '40mg'$ X2 days, '20mg'$  X2 days, '10mg'$ X2 days, then stop. (Patient not taking: Reported on 03/23/2016) 14 tablet 0   No facility-administered medications prior to visit.         Objective:   Physical Exam Vitals:   03/23/16 1705  BP: 140/70  Pulse: 93  SpO2: 92%   Weight: 163 lb 3.2 oz (74 kg)  Height: '5\' 2"'$  (1.575 m)   Gen: Pleasant, well-nourished, in no distress,  normal affect on O2 by [place]  ENT: No lesions,  mouth clear,  oropharynx clear, no postnasal drip  Neck: No JVD, no TMG, no carotid bruits  Lungs: No use of accessory muscles, clear without rales or rhonchi  Cardiovascular: RRR, heart sounds normal, no murmur or gallops, no peripheral edema  Musculoskeletal: No deformities, no cyanosis or clubbing  Neuro: alert, non focal  Skin: Warm, no lesions or rashes     Assessment & Plan:  Solitary pulmonary nodule on lung CT I discussed the CT scan of the chest in the PET scan with her in detail today. Reviewed the risks and benefits of all possible modes biopsy. I have explained why primary surgical resection is contraindicated. We will arrange for ENB to approach the lesion. She will have a superD CT chest. I would like to arrange for 03/31/16.   Baltazar Apo, MD, PhD 03/23/2016, 5:50 PM [facility] [phone] or if no answer [phone]

## 2016-03-31 NOTE — Discharge Instructions (Signed)
Flexible Bronchoscopy, Care After These instructions give you information on caring for yourself after your procedure. Your doctor may also give you more specific instructions. Call your doctor if you have any problems or questions after your procedure. Follow these instructions at home:  Do not eat or drink anything for 2 hours after your procedure. If you try to eat or drink before the medicine wears off, food or drink could go into your lungs. You could also burn yourself.  After 2 hours have passed and when you can cough and gag normally, you may eat soft food and drink liquids slowly.  The day after the test, you may eat your normal diet.  You may do your normal activities.  Keep all doctor visits. Get help right away if:  You get more and more short of breath.  You get light-headed.  You feel like you are going to pass out (faint).  You have chest pain.  You have new problems that worry you.  You cough up more than a little blood.  You cough up more blood than before. This information is not intended to replace advice given to you by your health care provider. Make sure you discuss any questions you have with your health care provider. Document Released: 11/08/2008 Document Revised: 06/19/2015 Document Reviewed: 09/15/2012 Elsevier Interactive Patient Education  2017 [employer] FOR ANY PROBLEMS OR QUESTIONS. [phone].

## 2016-03-31 NOTE — Op Note (Signed)
Video Bronchoscopy with Electromagnetic Navigation Procedure Note  Date of Operation: 03/31/2016  Pre-op Diagnosis: LUL nodule  Post-op Diagnosis: same  Surgeon: Baltazar Apo  Assistants: none  Anesthesia: General endotracheal anesthesia  Operation: Flexible video fiberoptic bronchoscopy with electromagnetic navigation and biopsies.  Estimated Blood Loss: Minimal  Complications: none apparent  Indications and History: Suzanne Dickson is a 71 y.o. female with hx tobacco, COPD, hypoxemia. She has been found to have a spiculated LUL nodule. Recommendation was made to achieve tissue diagnosis via navigational bronchoscopy. The risks, benefits, complications, treatment options and expected outcomes were discussed with the patient.  The possibilities of pneumothorax, pneumonia, reaction to medication, pulmonary aspiration, perforation of a viscus, bleeding, failure to diagnose a condition and creating a complication requiring transfusion or operation were discussed with the patient who freely signed the consent.    Description of Procedure: The patient was seen in the Preoperative Area, was examined and was deemed appropriate to proceed.  The patient was taken to OR 10, identified as Suzanne Dickson and the procedure verified as Flexible Video Fiberoptic Bronchoscopy.  A Time Out was held and the above information confirmed.   Prior to the date of the procedure a high-resolution CT scan of the chest was performed. Utilizing [place] a virtual tracheobronchial tree was generated to allow the creation of distinct navigation pathways to the patient's LUL parenchymal abnormalities. After being taken to the operating room general anesthesia was initiated and the patient  was orally intubated. The video fiberoptic bronchoscope was introduced via the endotracheal tube and a general inspection was performed which showed normal airways throughout. There was moderate tan mucous that was easily  suctioned. The extendable working channel and locator guide were introduced into the bronchoscope. The distinct navigation pathways prepared prior to this procedure were then utilized to navigate to within 0.5cm of patient's lesion identified on CT scan. The extendable working channel was secured into place and the locator guide was withdrawn. Under fluoroscopic guidance transbronchial needle brushings, transbronchial Wang needle biopsies, and transbronchial forceps biopsies were performed to be sent for cytology and pathology. A bronchioalveolar lavage was performed in the LUL and sent for cytology. Three fiducial markers were then placed under fluoroscopic guidance triangulating the LUL nodule to facilitate radiation therapy in the future. At the end of the procedure a general airway inspection was performed and there was no evidence of active bleeding. The bronchoscope was removed.  The patient tolerated the procedure well. There was no significant blood loss and there were no obvious complications. A post-procedural chest x-ray is pending.  Samples: 1. Transbronchial needle brushings from LUL nodule 2. Transbronchial Wang needle biopsies from LUL nodule 3. Transbronchial forceps biopsies from LUL nodule 4. Bronchoalveolar lavage from LUL   Plans:  The patient will be discharged from the PACU to home when recovered from anesthesia and after chest x-ray is reviewed. We will review the cytology, pathology results with the patient when they become available. Outpatient followup will be with Dr Lamonte Sakai or Dr Melvyn Novas.   Baltazar Apo, MD, PhD 03/31/2016, 10:18 AM [facility] [phone] or if no answer [phone]

## 2016-03-31 NOTE — Transfer of Care (Signed)
Immediate Anesthesia Transfer of Care Note  Patient: Suzanne Dickson  Procedure(s) Performed: Procedure(s): VIDEO BRONCHOSCOPY WITH ENDOBRONCHIAL NAVIGATION (N/A) PLACEMENT OF FUDUCIAL LEFT UPPER LOBE (Left)  Patient Location: PACU  Anesthesia Type:General  Level of Consciousness: awake and alert   Airway & Oxygen Therapy: Patient Spontanous Breathing and Patient connected to nasal cannula oxygen  Post-op Assessment: Report given to RN and Post -op Vital signs reviewed and stable  Post vital signs: Reviewed and stable  Last Vitals:  Vitals:   03/31/16 0632  BP: (!) 147/71  Pulse: 98  Resp: 18  Temp: 36.4 C    Last Pain:  Vitals:   03/31/16 8250  TempSrc: Oral         Complications: No apparent anesthesia complications

## 2016-04-01 ENCOUNTER — Encounter (HOSPITAL_COMMUNITY): Payer: Self-pay | Admitting: Emergency Medicine

## 2016-04-02 ENCOUNTER — Inpatient Hospital Stay: Payer: Medicare Other | Admitting: Emergency Medicine

## 2016-04-02 ENCOUNTER — Telehealth: Payer: Self-pay | Admitting: Emergency Medicine

## 2016-04-02 ENCOUNTER — Telehealth: Payer: Self-pay | Admitting: Internal Medicine

## 2016-04-02 MED ORDER — ALBUTEROL SULFATE (2.5 MG/3ML) 0.083% IN NEBU: 2.5000 mg | INHALATION_SOLUTION | RESPIRATORY_TRACT | 0 refills | Status: DC | PRN

## 2016-04-02 NOTE — Telephone Encounter (Signed)
Spoke with pt. She would like to reschedule her hospital follow up with RB. Pt had an appointment today but feels to weak to come in. Her HFU has been rescheduled with RB on 04/05/16 2pm. Nothing further was needed.

## 2016-04-02 NOTE — Telephone Encounter (Signed)
Spoke with pt in clinic, states that [facility] told her that they could not accept her printed rx for albuterol neb, it must be faxed from our office.  Pt states she has 4 vials of albuterol left, requesting a rx be sent to local pharmacy to get her through the weekend.  rx sent to [facility] on [address].   Called [facility] to check status of rx and spoke with Elmyra Ricks, states that the rx is being processed and a CMN was faxed to our office today.  Will close this encounter as this is already being handled.

## 2016-04-05 ENCOUNTER — Inpatient Hospital Stay: Payer: Medicare Other | Admitting: Emergency Medicine

## 2016-04-06 ENCOUNTER — Telehealth: Payer: Self-pay | Admitting: Emergency Medicine

## 2016-04-06 ENCOUNTER — Telehealth: Payer: Self-pay | Admitting: *Deleted

## 2016-04-06 DIAGNOSIS — C3492 Malignant neoplasm of unspecified part of left bronchus or lung: Secondary | ICD-10-CM

## 2016-04-06 DIAGNOSIS — R911 Solitary pulmonary nodule: Secondary | ICD-10-CM

## 2016-04-06 NOTE — Telephone Encounter (Signed)
Discussed results with the patient. Shows adenoCA of the LUL nodule. Her COPD precludes primary resection. I will refer her to [facility], try to arrange for molecular studies. She has a PET scan from 14-Mar-2016.

## 2016-04-06 NOTE — Telephone Encounter (Signed)
Oncology Nurse Navigator Documentation  Oncology Nurse Navigator Flowsheets 04/06/2016  Navigator Location [facility]  Referral date to RadOnc/MedOnc 04/06/2016  Navigator Encounter Type Telephone/I received referral on Suzanne Dickson today.  I gave her an appt for [name] on 04/15/16 arrive at 2:30.  She verbalized understanding of appt time and place.    Telephone Outgoing Call  Treatment Phase Pre-Tx/Tx Discussion  Barriers/Navigation Needs Coordination of Care  Interventions Coordination of Care  Coordination of Care Appts  Acuity Level 2  Acuity Level 2 Assistance expediting appointments  Time Spent with Patient 30

## 2016-04-09 ENCOUNTER — Encounter: Payer: Self-pay | Admitting: *Deleted

## 2016-04-09 NOTE — Progress Notes (Signed)
Oncology Nurse Navigator Documentation  Oncology Nurse Navigator Flowsheets 04/09/2016  Navigator Location [facility]  Navigator Encounter Type Letter/Fax/Email/I placed [place] letter and map of [facility] in outgoing mail today.   Treatment Phase Pre-Tx/Tx Discussion  Barriers/Navigation Needs Education  Education Other  Interventions Education  Education Method Written  Acuity Level 2  Time Spent with Patient 15

## 2016-04-12 ENCOUNTER — Telehealth: Payer: Self-pay | Admitting: Internal Medicine

## 2016-04-12 NOTE — Telephone Encounter (Signed)
Called and spoke to pt. Pt states she was told by [facility] that they did not receive the albuterol neb, advised pt an rx was sent to [facility] on 2.28.18. Advised pt we can send another rx in, pt insisted on a calling [facility] and states she will have them call us if there are issues. Will keep message open to f/u on to be sure pt gets her medication.

## 2016-04-15 ENCOUNTER — Ambulatory Visit: Payer: Medicare Other | Attending: Internal Medicine | Admitting: Physical Therapy

## 2016-04-15 ENCOUNTER — Ambulatory Visit (HOSPITAL_BASED_OUTPATIENT_CLINIC_OR_DEPARTMENT_OTHER): Payer: Medicare Other | Admitting: Internal Medicine

## 2016-04-15 ENCOUNTER — Encounter: Payer: Self-pay | Admitting: *Deleted

## 2016-04-15 ENCOUNTER — Encounter: Payer: Self-pay | Admitting: Internal Medicine

## 2016-04-15 ENCOUNTER — Other Ambulatory Visit (HOSPITAL_BASED_OUTPATIENT_CLINIC_OR_DEPARTMENT_OTHER): Payer: Medicare Other

## 2016-04-15 ENCOUNTER — Ambulatory Visit
Admission: RE | Admit: 2016-04-15 | Discharge: 2016-04-15 | Disposition: A | Discharge status: Home / Self Care | Payer: Medicare Other | Source: Ambulatory Visit | Attending: Radiation Oncology | Admitting: Radiation Oncology

## 2016-04-15 VITALS — BP 150/65 | HR 93 | Temp 98.8°F | Resp 19 | Ht 62.0 in | Wt 161.6 lb

## 2016-04-15 DIAGNOSIS — C3412 Malignant neoplasm of upper lobe, left bronchus or lung: Secondary | ICD-10-CM

## 2016-04-15 DIAGNOSIS — R911 Solitary pulmonary nodule: Secondary | ICD-10-CM

## 2016-04-15 DIAGNOSIS — C3492 Malignant neoplasm of unspecified part of left bronchus or lung: Secondary | ICD-10-CM

## 2016-04-15 DIAGNOSIS — J449 Chronic obstructive pulmonary disease, unspecified: Secondary | ICD-10-CM

## 2016-04-15 DIAGNOSIS — R293 Abnormal posture: Secondary | ICD-10-CM | POA: Insufficient documentation

## 2016-04-15 DIAGNOSIS — I10 Essential (primary) hypertension: Secondary | ICD-10-CM

## 2016-04-15 DIAGNOSIS — Z87891 Personal history of nicotine dependence: Secondary | ICD-10-CM | POA: Diagnosis not present

## 2016-04-15 DIAGNOSIS — R262 Difficulty in walking, not elsewhere classified: Secondary | ICD-10-CM | POA: Diagnosis present

## 2016-04-15 HISTORY — DX: Malignant neoplasm of unspecified part of left bronchus or lung: C34.92

## 2016-04-15 LAB — COMPREHENSIVE METABOLIC PANEL
ALT: 16 U/L (ref 0–55)
ANION GAP: 9 meq/L (ref 3–11)
AST: 21 U/L (ref 5–34)
Albumin: 4.3 g/dL (ref 3.5–5.0)
Alkaline Phosphatase: 61 U/L (ref 40–150)
BILIRUBIN TOTAL: 0.48 mg/dL (ref 0.20–1.20)
BUN: 18.5 mg/dL (ref 7.0–26.0)
CHLORIDE: 106 meq/L (ref 98–109)
CO2: 24 meq/L (ref 22–29)
CREATININE: 1.2 mg/dL — AB (ref 0.6–1.1)
Calcium: 9.7 mg/dL (ref 8.4–10.4)
EGFR: 56 mL/min/{1.73_m2} — ABNORMAL LOW (ref >90)
GLUCOSE: 85 mg/dL (ref 70–140)
Potassium: 3.6 mEq/L (ref 3.5–5.1)
SODIUM: 139 meq/L (ref 136–145)
TOTAL PROTEIN: 8.2 g/dL (ref 6.4–8.3)

## 2016-04-15 LAB — CBC WITH DIFFERENTIAL/PLATELET
BASO%: 0.6 % (ref 0.0–2.0)
Basophils Absolute: 0 10*3/uL (ref 0.0–0.1)
EOS%: 3.6 % (ref 0.0–7.0)
Eosinophils Absolute: 0.2 10*3/uL (ref 0.0–0.5)
HCT: 30 % — ABNORMAL LOW (ref 34.8–46.6)
HGB: 9.4 g/dL — ABNORMAL LOW (ref 11.6–15.9)
LYMPH%: 28.8 % (ref 14.0–49.7)
MCH: 24.9 pg — ABNORMAL LOW (ref 25.1–34.0)
MCHC: 31.3 g/dL — AB (ref 31.5–36.0)
MCV: 79.6 fL (ref 79.5–101.0)
MONO#: 0.6 10*3/uL (ref 0.1–0.9)
MONO%: 10 % (ref 0.0–14.0)
NEUT%: 57 % (ref 38.4–76.8)
NEUTROS ABS: 3.4 10*3/uL (ref 1.5–6.5)
Platelets: 176 10*3/uL (ref 145–400)
RBC: 3.77 10*6/uL (ref 3.70–5.45)
RDW: 14.9 % — ABNORMAL HIGH (ref 11.2–14.5)
WBC: 5.9 10*3/uL (ref 3.9–10.3)
lymph#: 1.7 10*3/uL (ref 0.9–3.3)

## 2016-04-15 NOTE — Progress Notes (Signed)
[facility] [phone]   [phone]) [phone] Multidisciplinary thoracic oncology clinic  CONSULT NOTE  REFERRING PHYSICIAN: Dr. Baltazar Apo  REASON FOR CONSULTATION:  71 years old African-American female recently diagnosed with lung cancer.  HPI Suzanne Dickson is a 71 y.o. female with past medical history significant for multiple medical problems including history of COPD, GERD, hypertension, depression, anxiety, history of congestive heart failure many years ago secondary to viral infection and long history of smoking. The patient had a questionable nodule in the left upper lobe since October 2017 and she was followed closely by Dr. Melvyn Novas. Repeat chest x-ray on 02/03/2016 showed persistent opacity in the left mid lung area. This was followed by CT scan of the chest on 02/05/2016 and it showed 2.0 cm spiculated nodule in the left upper lobe questionable for primary lung neoplasm.\ The PET scan was performed in 02/20/2016 and showed hypermetabolic left upper lobe pulmonary nodule with no evidence of metastatic disease to the hilar or mediastinal lymph node and no distant metastatic disease. The patient was seen by Dr. Lamonte Sakai and on 03/31/2016 she underwent a video bronchoscopy with electromagnetic navigational bronchoscopy and biopsy of the left upper lobe. The final pathology [id number]) was consistent with adenocarcinoma. Dr. Lamonte Sakai kindly referred the patient to the multidisciplinary thoracic oncology clinic today for further evaluation and recommendation regarding treatment of her condition. When seen today the patient continues to complain of the baseline shortness of breath and she is currently on home oxygen. She is very anxious about her diagnosis. She denied having any chest pain, cough or hemoptysis. She has no significant weight loss or night sweats. She has no headache or visual changes. She denied having any nausea, vomiting, diarrhea or  constipation. Family history significant for mother and father died from heart disease. The patient is married and has one son and 2 step children. She was accompanied today by her husband Suzanne Dickson. She used to work as an Chief Technology Officer. She has a history of smoking 1 pack per day for around 40 years and quit in 2016. She has no history of alcohol or drug abuse.  HPI  Past Medical History:  Diagnosis Date  . Anxiety   . Arthritis   . Bronchitis, chronic ([place])   . COPD (chronic obstructive pulmonary disease) ([place])   . Depression   . Dyspnea    with exertion  . GERD (gastroesophageal reflux disease)   . History of blood transfusion    as a teen   . Hypertension   . Pneumonia 10/2015    Past Surgical History:  Procedure Laterality Date  . CHOLECYSTECTOMY    . COLONOSCOPY    . FUDUCIAL PLACEMENT Left 03/31/2016   Procedure: PLACEMENT OF FUDUCIAL LEFT UPPER LOBE;  Surgeon: Collene Gobble, MD;  Location: [facility];  Service: Thoracic;  Laterality: Left;  . SINUSOTOMY    . VIDEO BRONCHOSCOPY WITH ENDOBRONCHIAL NAVIGATION N/A 03/31/2016   Procedure: VIDEO BRONCHOSCOPY WITH ENDOBRONCHIAL NAVIGATION;  Surgeon: Collene Gobble, MD;  Location: [facility];  Service: Thoracic;  Laterality: N/A;    History reviewed. No pertinent family history.  Social History Social History  Substance Use Topics  . Smoking status: Former Smoker    Packs/day: 1.00    Years: 40.00    Types: Cigarettes    Quit date: 09/02/2014  . Smokeless tobacco: Never Used  . Alcohol use No    Allergies  Allergen Reactions  . No Known Allergies  Current Outpatient Prescriptions  Medication Sig Dispense Refill  . albuterol (PROVENTIL) (2.5 MG/3ML) 0.083% nebulizer solution Take 3 mLs (2.5 mg total) by nebulization every 4 (four) hours as needed. For shortness of breath 75 mL 0  . amLODipine (NORVASC) 5 MG tablet Take 1 tablet by mouth daily.    Marland Kitchen aspirin 81 MG tablet Take 81 mg by mouth daily.    .  diphenhydrAMINE (SOMINEX) 25 MG tablet Take 25 mg by mouth at bedtime as needed for sleep.    . famotidine (PEPCID) 20 MG tablet Take 20 mg by mouth every evening.  0  . Glycopyrrolate-Formoterol (BEVESPI AEROSPHERE) 9-4.8 MCG/ACT AERO Inhale 2 puffs into the lungs 2 (two) times daily. 1 Inhaler 0  . hydrochlorothiazide (MICROZIDE) 12.5 MG capsule Take 12.5 mg by mouth daily.  0  . ibuprofen (ADVIL,MOTRIN) 200 MG tablet Take 400 mg by mouth at bedtime.     . OXYGEN 2lpm with sleep and 4 lpm with exertion    . pantoprazole (PROTONIX) 40 MG tablet take 1 tablet by mouth once daily  TAKE 30-60 MIN BEFORE FIRST MEAL OF THE DAY 30 tablet 5  . sertraline (ZOLOFT) 50 MG tablet Take 50 mg by mouth 2 (two) times daily.  0  . albuterol (PROAIR HFA) 108 (90 Base) MCG/ACT inhaler Inhale 2 puffs into the lungs every 4 (four) hours as needed. 2 puffs every 4 hours as needed only  if your can't catch your breath (Patient not taking: Reported on 04/15/2016)     No current facility-administered medications for this visit.     Review of Systems  Constitutional: positive for fatigue Eyes: negative Ears, nose, mouth, throat, and face: negative Respiratory: positive for dyspnea on exertion Cardiovascular: negative Gastrointestinal: negative Genitourinary:negative Integument/breast: negative Hematologic/lymphatic: negative Musculoskeletal:positive for muscle weakness Neurological: negative Behavioral/Psych: negative Endocrine: negative Allergic/Immunologic: negative  Physical Exam  OIZ:TIWPY, healthy, no distress, well nourished, well developed and anxious SKIN: skin color, texture, turgor are normal, no rashes or significant lesions HEAD: Normocephalic, No masses, lesions, tenderness or abnormalities EYES: normal, PERRLA EARS: External ears normal, Canals clear OROPHARYNX:no exudate, no erythema and lips, buccal mucosa, and tongue normal  NECK: supple, no adenopathy, no JVD LYMPH:  no palpable  lymphadenopathy, no hepatosplenomegaly BREAST:not examined LUNGS: prolonged expiratory phase, expiratory wheezes bilaterally HEART: regular rate & rhythm, no murmurs and no gallops ABDOMEN:abdomen soft, non-tender, normal bowel sounds and no masses or organomegaly BACK: Back symmetric, no curvature., No CVA tenderness EXTREMITIES:no joint deformities, effusion, or inflammation, no edema, no skin discoloration  NEURO: alert & oriented x 3 with fluent speech, no focal motor/sensory deficits  PERFORMANCE STATUS: ECOG 1-2  LABORATORY DATA: Lab Results  Component Value Date   WBC 5.9 04/15/2016   HGB 9.4 (L) 04/15/2016   HCT 30.0 (L) 04/15/2016   MCV 79.6 04/15/2016   PLT 176 04/15/2016      Chemistry      Component Value Date/Time   NA 139 04/15/2016 1443   K 3.6 04/15/2016 1443   CL 104 03/29/2016 1200   CO2 24 04/15/2016 1443   BUN 18.5 04/15/2016 1443   CREATININE 1.2 (H) 04/15/2016 1443      Component Value Date/Time   CALCIUM 9.7 04/15/2016 1443   ALKPHOS 61 04/15/2016 1443   AST 21 04/15/2016 1443   ALT 16 04/15/2016 1443   BILITOT 0.48 04/15/2016 1443       RADIOGRAPHIC STUDIES: Dg Chest 2 View  Result Date: 03/31/2016 CLINICAL DATA:  Preoperative examination  prior to left upper lobectomy for malignancy. History of COPD. Former smoker. On home oxygen. EXAM: CHEST  2 VIEW COMPARISON:  CT scan of chest of March 26, 2016 and chest x-ray of February 03, 2016. FINDINGS: The there is subtle increased density on the left in the region of the known mass in the inferior aspect of the upper lobe. Elsewhere there are coarse lower lobe lung markings bilaterally. There is mild hyperinflation. The cardiac silhouette and pulmonary vascularity are normal. There is calcification in the wall of the aortic arch. The bony thorax exhibits no acute abnormality. IMPRESSION: COPD. Known abnormal soft tissue mass inferiorly in the left upper lobe. No acute pneumonia. Thoracic aortic  atherosclerosis. Electronically Signed   By: David  Martinique M.D.   On: 03/31/2016 08:21   Dg Chest Port 1 View  Result Date: 03/31/2016 CLINICAL DATA:  Post bronchoscopy and biopsy EXAM: PORTABLE CHEST 1 VIEW COMPARISON:  03/31/2016 FINDINGS: Clips are noted in the left upper lobe in the area of previously seen nodular density. No pneumothorax. Mild vascular congestion. Heart is borderline in size. Perihilar opacities on the left could be related to recent bronchoscopy. IMPRESSION: No pneumothorax. Left perihilar opacities likely related to recent biopsy and bronchoscopy. Electronically Signed   By: Rolm Baptise M.D.   On: 03/31/2016 11:18   Ct Super D Chest Wo Contrast  Result Date: 03/26/2016 CLINICAL DATA:  Left upper lobe nodule down 12/05/2016 EXAM: CT CHEST WITHOUT CONTRAST TECHNIQUE: Multidetector CT imaging of the chest was performed using thin slice collimation for electromagnetic bronchoscopy planning purposes, without intravenous contrast. COMPARISON:  None. FINDINGS: Cardiovascular: The heart size is normal. No pericardial effusion. Coronary artery calcification is noted. Atherosclerotic calcification is noted in the wall of the thoracic aorta. Mediastinum/Nodes: No mediastinal lymphadenopathy. No evidence for gross hilar lymphadenopathy although assessment is limited by the lack of intravenous contrast on today's study. The esophagus has normal imaging features. There is no axillary lymphadenopathy. Lungs/Pleura: Centrilobular and paraseptal emphysema noted. Bronchial wall thickening is evident. No substantial change in the 2 cm spiculated posterior left upper lobe pulmonary nodule with tethering to the major fissure. No pulmonary edema or pleural effusion. Upper Abdomen: Gallbladder surgically absent. Nonobstructing stones upper pole left kidney cyst in the upper pole the right kidney incompletely visualized. Musculoskeletal: Bone windows reveal no worrisome lytic or sclerotic osseous lesions.  IMPRESSION: 1. No substantial interval change in the 2 cm spiculated posterior left upper lobe pulmonary nodule. 2.  Emphysema. (ICD10-J43.9) 3.  Abdominal Aortic Atherosclerois (ICD10-170.0) Electronically Signed   By: Misty [facility] M.D.   On: 03/26/2016 11:26   Dg C-arm Bronchoscopy  Result Date: 03/31/2016 C-ARM BRONCHOSCOPY: Fluoroscopy was utilized by the requesting physician.  No radiographic interpretation.    ASSESSMENT: This is a very pleasant 71 years old African-American female recently diagnosed with a stage IA (T1a, N0, M0) non-small cell lung cancer, adenocarcinoma presented with left upper lobe lung nodule diagnosed in March 2018.  PLAN: I had a lengthy discussion with the patient and her husband today about her current disease is stage, prognosis and treatment options. I explained to the patient that the best option for treatment of stage IA non-small cell lung cancer is surgical resection but unfortunately the patient is not a good surgical candidate for resection because of her poor pulmonary function and other comorbidities. I recommended for her to consider treatment with stereotactic radiotherapy. The patient will see Dr. Sondra Come later today for evaluation and discussion of this option. I will  arrange for the patient to come back for follow-up visit in 4 months for reevaluation with repeat CT scan of the chest for restaging of her disease after the treatment. For COPD and the other pulmonary issues, the patient will continue her current treatment under the care of Dr. Melvyn Novas. The patient was seen during the multidisciplinary thoracic oncology clinic today by medical oncology, radiation oncology, thoracic navigator, physical therapist and social worker. She was advised to call immediately if she has any concerning symptoms in the interval. The patient voices understanding of current disease status and treatment options and is in agreement with the current care plan.  All questions  were answered. The patient knows to call the clinic with any problems, questions or concerns. We can certainly see the patient much sooner if necessary.  Thank you so much for allowing me to participate in the care of Suzanne Dickson. I will continue to follow up the patient with you and assist in her care.  I spent 40 minutes counseling the patient face to face. The total time spent in the appointment was 60 minutes.  Disclaimer: This note was dictated with voice recognition software. Similar sounding words can inadvertently be transcribed and may not be corrected upon review.   Alannie Amodio K. April 15, 2016, 3:35 PM

## 2016-04-15 NOTE — Progress Notes (Signed)
Radiation Oncology         [phone] ________________________________  Multidisciplinary Thoracic Oncology Clinic [facility]) Initial Outpatient Consultation  Name: Suzanne Dickson MRN: [id number]  Date: 04/15/2016  DOB: February 19, 1945  ON:GEXBMWU,XLKGMW O, MD  Lamonte Sakai Rose Fillers, MD   REFERRING PHYSICIAN: Collene Gobble, MD  DIAGNOSIS: The encounter diagnosis was Adenocarcinoma of left lung, stage 1 ([place]).    ICD-9-CM ICD-10-CM   1. Adenocarcinoma of left lung, stage 1 (HCC) 162.9 C34.92      Stage IA (T1a, N0, M0) non-small cell lung cancer, adenocarcinoma, of the left upper lung  HISTORY OF PRESENT ILLNESS::Suzanne Dickson is a 71 y.o. female who has a history of COPD and is followed by Dr. Melvyn Novas. Shortness of breath and cough in October 2017 led to the patient to have a chest X-ray on 11/04/15. This showed a small area of increased linear opacity in the left mid lung possibly representing a developing infection. This was felt to be pneumonia. Repeat chest X-ray on 11/19/15 showed an improved but persistent infiltrative density in the left mid lung. Another chest X-ray on 12/23/15 for cough and dyspnea showed a stable nodular density in the left mid lung. Repeat chest x-ray on 02/13/16 showed a persistent opacity in the left mid lung region and malignancy was a possibility. CT of the chest on 02/05/16 showed a 2.0 x 2.0 cm spiculated nodule in the posterior inferior left upper lobe suspicious for a primary bronchogenic carcinoma. PET scan on 02/20/16 showed an intense focus of increased uptake in the distal aspect of the tongue with SUV max 22.01, an indeterminate focus of increased uptake localized to the left base of the skull with SUV max 8.51, and a 2 cm spiculated nodule in the posterior left upper lobe with SUV max 12.0 compatible with a T1N1M0 lesion. The patient saw Dr. Lamonte Sakai on 03/23/16 who discussed biopsy of the left lung lesion. CT super D of the chest on 03/26/16 showed no substantial  interval change in the 2 cm spiculated left upper lobe pulmonary nodule. Biopsy of the left upper lobe on 03/31/16 revealed adenocarcinoma. Results of her pulmonary function test on 12/14/14 likely excludes the patient from surgical resection. The patient and her husband were referred today for presentation in the multidisciplinary conference.  PREVIOUS RADIATION THERAPY: No  PAST MEDICAL HISTORY:  has a past medical history of Adenocarcinoma of left lung, stage 1 ([place]) (04/15/2016); Anxiety; Arthritis; Bronchitis, chronic (HCC); COPD (chronic obstructive pulmonary disease) ([place]); Depression; Dyspnea; GERD (gastroesophageal reflux disease); History of blood transfusion; Hypertension; and Pneumonia (10/2015).    PAST SURGICAL HISTORY: Past Surgical History:  Procedure Laterality Date  . CHOLECYSTECTOMY    . COLONOSCOPY    . FUDUCIAL PLACEMENT Left 03/31/2016   Procedure: PLACEMENT OF FUDUCIAL LEFT UPPER LOBE;  Surgeon: Collene Gobble, MD;  Location: [place];  Service: Thoracic;  Laterality: Left;  . SINUSOTOMY    . VIDEO BRONCHOSCOPY WITH ENDOBRONCHIAL NAVIGATION N/A 03/31/2016   Procedure: VIDEO BRONCHOSCOPY WITH ENDOBRONCHIAL NAVIGATION;  Surgeon: Collene Gobble, MD;  Location: [place];  Service: Thoracic;  Laterality: N/A;    FAMILY HISTORY: family history is not on file.  SOCIAL HISTORY:  reports that she quit smoking about 19 months ago. Her smoking use included Cigarettes. She has a 40.00 pack-year smoking history. She has never used smokeless tobacco. She reports that she does not drink alcohol or use drugs.  ALLERGIES: No known allergies  MEDICATIONS:  Current Outpatient Prescriptions  Medication Sig  Dispense Refill  . albuterol (PROAIR HFA) 108 (90 Base) MCG/ACT inhaler Inhale 2 puffs into the lungs every 4 (four) hours as needed. 2 puffs every 4 hours as needed only  if your can't catch your breath (Patient not taking: Reported on 04/15/2016)    . albuterol (PROVENTIL) (2.5 MG/3ML) 0.083%  nebulizer solution Take 3 mLs (2.5 mg total) by nebulization every 4 (four) hours as needed. For shortness of breath 75 mL 0  . amLODipine (NORVASC) 5 MG tablet Take 1 tablet by mouth daily.    Marland Kitchen aspirin 81 MG tablet Take 81 mg by mouth daily.    . diphenhydrAMINE (SOMINEX) 25 MG tablet Take 25 mg by mouth at bedtime as needed for sleep.    . famotidine (PEPCID) 20 MG tablet Take 20 mg by mouth every evening.  0  . Glycopyrrolate-Formoterol (BEVESPI AEROSPHERE) 9-4.8 MCG/ACT AERO Inhale 2 puffs into the lungs 2 (two) times daily. 1 Inhaler 0  . hydrochlorothiazide (MICROZIDE) 12.5 MG capsule Take 12.5 mg by mouth daily.  0  . ibuprofen (ADVIL,MOTRIN) 200 MG tablet Take 400 mg by mouth at bedtime.     . OXYGEN 2lpm with sleep and 4 lpm with exertion    . pantoprazole (PROTONIX) 40 MG tablet take 1 tablet by mouth once daily  TAKE 30-60 MIN BEFORE FIRST MEAL OF THE DAY 30 tablet 5  . sertraline (ZOLOFT) 50 MG tablet Take 50 mg by mouth 2 (two) times daily.  0   No current facility-administered medications for this encounter.     REVIEW OF SYSTEMS:  A 10 point review of systems is documented in the electronic medical record. This was obtained by the nursing staff. However, I reviewed this with the patient to discuss relevant findings and make appropriate changes.  Pertinent items noted in HPI and remainder of comprehensive ROS otherwise negative.   The patient has shortness of breath for which she uses oxygen via nasal cannula, 2 liters. She denies chest pain, cough, hemoptysis, weight loss, night sweats, visual changes, headaches, nausea, emesis, diarrhea, or constipation.    PHYSICAL EXAM:  Vitals with BMI 04/15/2016  Height '5\' 2"'$   Weight 161 lbs 10 oz  BMI 81.1  Systolic 914  Diastolic 65  Pulse 93  Respirations 19   General: Alert and oriented, in no acute distress HEENT: Head is normocephalic. Extraocular movements are intact. Oropharynx is clear. (+) Poor detention with several teeth  missing. No suspicious lesions noted in the floor of mouth or anterior tongue. Erythema along the gum line in which the patient reports this is from irritation of her dentures. Erythema might explain her uptake on PET scan. Neck: Neck is supple, no palpable cervical or supraclavicular lymphadenopathy. Heart: Regular in rate and rhythm with no murmurs, rubs, or gallops. Chest: (+) Oxygen via nasal cannula. Clear to auscultation bilaterally, with generally distant breath sounds. Abdomen: Soft, nontender, nondistended, with no rigidity or guarding. Extremities: No cyanosis or edema. Lymphatics: see Neck Exam Skin: No concerning lesions. Musculoskeletal: symmetric strength and muscle tone throughout. Neurologic: Cranial nerves II through XII are grossly intact. No obvious focalities. Speech is fluent. Coordination is intact. Psychiatric: Judgment and insight are intact. Affect is appropriate.  KPS = 70  100 - Normal; no complaints; no evidence of disease. 90   - Able to carry on normal activity; minor signs or symptoms of disease. 80   - Normal activity with effort; some signs or symptoms of disease. 97   - Cares for self;  unable to carry on normal activity or to do active work. 60   - Requires occasional assistance, but is able to care for most of his personal needs. 50   - Requires considerable assistance and frequent medical care. 57   - Disabled; requires special care and assistance. 92   - Severely disabled; hospital admission is indicated although death not imminent. 27   - Very sick; hospital admission necessary; active supportive treatment necessary. 10   - Moribund; fatal processes progressing rapidly. 0     - Dead  Karnofsky DA, Abelmann [place], Craver LS and Burchenal [facility] [phone]) The use of the nitrogen mustards in the palliative treatment of carcinoma: with particular reference to bronchogenic carcinoma Cancer 1 634-56  LABORATORY DATA:  Lab Results  Component Value Date   WBC 5.9  04/15/2016   HGB 9.4 (L) 04/15/2016   HCT 30.0 (L) 04/15/2016   MCV 79.6 04/15/2016   PLT 176 04/15/2016   Lab Results  Component Value Date   NA 139 04/15/2016   K 3.6 04/15/2016   CL 104 03/29/2016   CO2 24 04/15/2016   Lab Results  Component Value Date   ALT 16 04/15/2016   AST 21 04/15/2016   ALKPHOS 61 04/15/2016   BILITOT 0.48 04/15/2016    PULMONARY FUNCTION TEST:   Recent Review Flowsheet Data    Spirometry Latest Ref Rng & Units 12/09/2014   FVC-%PRED-PRE % 59   FVC-%PRED-POST % 55   FEV1-PRE L 0.80   FEV1-%PRED-PRE % 47   FEV1-POST L 0.75   FEV1-%PRED-POST % 44   DLCO [facility] ml/min/mmHg 5.18      RADIOGRAPHY: Dg Chest 2 View  Result Date: 03/31/2016 CLINICAL DATA:  Preoperative examination prior to left upper lobectomy for malignancy. History of COPD. Former smoker. On home oxygen. EXAM: CHEST  2 VIEW COMPARISON:  CT scan of chest of March 26, 2016 and chest x-ray of February 03, 2016. FINDINGS: The there is subtle increased density on the left in the region of the known mass in the inferior aspect of the upper lobe. Elsewhere there are coarse lower lobe lung markings bilaterally. There is mild hyperinflation. The cardiac silhouette and pulmonary vascularity are normal. There is calcification in the wall of the aortic arch. The bony thorax exhibits no acute abnormality. IMPRESSION: COPD. Known abnormal soft tissue mass inferiorly in the left upper lobe. No acute pneumonia. Thoracic aortic atherosclerosis. Electronically Signed   By: David  Martinique M.D.   On: 03/31/2016 08:21   Dg Chest Port 1 View  Result Date: 03/31/2016 CLINICAL DATA:  Post bronchoscopy and biopsy EXAM: PORTABLE CHEST 1 VIEW COMPARISON:  03/31/2016 FINDINGS: Clips are noted in the left upper lobe in the area of previously seen nodular density. No pneumothorax. Mild vascular congestion. Heart is borderline in size. Perihilar opacities on the left could be related to recent bronchoscopy. IMPRESSION: No  pneumothorax. Left perihilar opacities likely related to recent biopsy and bronchoscopy. Electronically Signed   By: Rolm Baptise M.D.   On: 03/31/2016 11:18   Ct Super D Chest Wo Contrast  Result Date: 03/26/2016 CLINICAL DATA:  Left upper lobe nodule down 12/05/2016 EXAM: CT CHEST WITHOUT CONTRAST TECHNIQUE: Multidetector CT imaging of the chest was performed using thin slice collimation for electromagnetic bronchoscopy planning purposes, without intravenous contrast. COMPARISON:  None. FINDINGS: Cardiovascular: The heart size is normal. No pericardial effusion. Coronary artery calcification is noted. Atherosclerotic calcification is noted in the wall of the thoracic aorta. Mediastinum/Nodes: No mediastinal  lymphadenopathy. No evidence for gross hilar lymphadenopathy although assessment is limited by the lack of intravenous contrast on today's study. The esophagus has normal imaging features. There is no axillary lymphadenopathy. Lungs/Pleura: Centrilobular and paraseptal emphysema noted. Bronchial wall thickening is evident. No substantial change in the 2 cm spiculated posterior left upper lobe pulmonary nodule with tethering to the major fissure. No pulmonary edema or pleural effusion. Upper Abdomen: Gallbladder surgically absent. Nonobstructing stones upper pole left kidney cyst in the upper pole the right kidney incompletely visualized. Musculoskeletal: Bone windows reveal no worrisome lytic or sclerotic osseous lesions. IMPRESSION: 1. No substantial interval change in the 2 cm spiculated posterior left upper lobe pulmonary nodule. 2.  Emphysema. (ICD10-J43.9) 3.  Abdominal Aortic Atherosclerois (ICD10-170.0) Electronically Signed   By: Misty Stanley M.D.   On: 03/26/2016 11:26   Dg C-arm Bronchoscopy  Result Date: 03/31/2016 C-ARM BRONCHOSCOPY: Fluoroscopy was utilized by the requesting physician.  No radiographic interpretation.      IMPRESSION: Stage IA (T1a, N0, M0) non-small cell lung cancer,  adenocarcinoma, of the left upper lung  The patient is not a candidate for surgical resection given her poor pulmonary function and other comorbidities. However, she is a candidate for SBRT directed to the left upper lung nodule. We discussed that she has stage I lung cancer and the results of SBRT with provide a greater than 85% local control. We discussed that she may fail in the mediastinum hilum or elsewhere in the body and radiation is a local only process. We discussed the process of CT simulation and the use of a pad all to decrease respiratory motion. We discussed the use of 4-dimensional simulation to minimize normal lung tissue treated and the use of respiratory compression. We discussed 3- 5 treatments occurring every other day as an outpatient. We discussed these treatments will last about 10-20 minutes and she would be here at the hospital for about an hour. We discussed that SBRT is unlikely to make her breathing any worse. It is also unlikely to make her breathing symptoms any better. We discussed possible side effects including shoulder pain due to arm positioning and possible rib fracture with the pleural-based nodule's proximity to the ribs. We discussed damage to other critical normal structures including heart, ribs, lung collapse, chronic cough, and brachial plexus injury. We discussed that without treatment this could develop into a more aggressive or even metastatic cancer. The patient signed a consent form and a copy was placed in her medical chart.  PLAN: We will try to schedule CT simulation for next week. For the patient's COPD and other pulmonary issues, Dr. Melvyn Novas will follow the patient in that regard. She will follow up with Dr. Julien Nordmann in 4 months for re-evaluation and repeat CT scan of the chest for re-staging after her treatment.   ------------------------------------------------ -----------------------------------  Blair Promise, PhD, MD  This document serves as a  record of services personally performed by Gery Pray, MD. It was created on his behalf by Darcus Austin, a trained medical scribe. The creation of this record is based on the scribe's personal observations and the provider's statements to them. This document has been checked and approved by the attending provider.

## 2016-04-15 NOTE — Therapy (Signed)
[place], [place], [address] Phone: [phone]   Fax:  [phone]  Physical Therapy Evaluation  Patient Details  Name: Suzanne Dickson MRN: [id number] Date of Birth: [birth year] Referring Provider: Dr. Curt Bears  Encounter Date: 04/15/2016      PT End of Session - 04/15/16 1648    Visit Number 1   Number of Visits 1   PT Start Time 1600   PT Stop Time 5176  and 1625-1635   PT Time Calculation (min) 12 min   Activity Tolerance Patient tolerated treatment well   Behavior During Therapy [facility] for tasks assessed/performed      Past Medical History:  Diagnosis Date  . Adenocarcinoma of left lung, stage 1 ([place]) 04/15/2016  . Anxiety   . Arthritis   . Bronchitis, chronic ([place])   . COPD (chronic obstructive pulmonary disease) ([place])   . Depression   . Dyspnea    with exertion  . GERD (gastroesophageal reflux disease)   . History of blood transfusion    as a teen   . Hypertension   . Pneumonia 10/2015    Past Surgical History:  Procedure Laterality Date  . CHOLECYSTECTOMY    . COLONOSCOPY    . FUDUCIAL PLACEMENT Left 03/31/2016   Procedure: PLACEMENT OF FUDUCIAL LEFT UPPER LOBE;  Surgeon: Collene Gobble, MD;  Location: [place];  Service: Thoracic;  Laterality: Left;  . SINUSOTOMY    . VIDEO BRONCHOSCOPY WITH ENDOBRONCHIAL NAVIGATION N/A 03/31/2016   Procedure: VIDEO BRONCHOSCOPY WITH ENDOBRONCHIAL NAVIGATION;  Surgeon: Collene Gobble, MD;  [facility];  Service: Thoracic;  Laterality: N/A;    There were no vitals filed for this visit.       Subjective Assessment - 04/15/16 1622    Subjective Patient has been through pulmonary rehab in the last two years and was doing regular exercise at home until recently when she started feeling bad.     Patient is accompained by: Family member  husband   Pertinent History Patient had been followed due to h/o COPD.  Had x-rays and scans in  January, then bronchoscopy 03/31/16; now with diagnosis of left upper lobe adenocarcinoma 2 cm. in size; also has hypermetabolic activity on PET scan at left skull base and anterior tongue that have not been evaluated.  Expected to have SBRT.  Ex-smoker wuo quit 2016 after 40 pack-years; COPD and on constant O2 at 2L (4L during exercise).  Anxiety; arthritis; HTN.   Patient Stated Goals get info from all lung clinic providers   Currently in Pain? No/denies            [facility] PT Assessment - 04/15/16 0001      Assessment   Medical Diagnosis left upper lobe adenocarcinoma, 2 cm.   Referring Provider Dr. Curt Bears   Onset Date/Surgical Date 02/03/16   Prior Therapy none  but has done pulmonary rehab 1-2 years ago     Precautions   Precautions Other (comment)   Precaution Comments cancer precautions; on fulltime O2 at 2L at rest, 4L during exercise     Restrictions   Weight Bearing Restrictions No     Balance Screen   Has the patient fallen in the past 6 months No   Has the patient had a decrease in activity level because of a fear of falling?  No   Is the patient reluctant to leave their home because of a fear of falling?  No  Home Environment   Living Environment Private residence   Living Arrangements Spouse/significant other   Type of [place] Two level;Able to live on main level with bedroom/bathroom     Prior Function   Level of Independence Requires assistive device for independence  O2   Leisure had been doing treadmill, etc. 30 mins. daily, but cut back when she began not to feel well.  Has started back at 10 minutes and is working to increase that to 30 again.     Cognition   Overall Cognitive Status Within Functional Limits for tasks assessed     Observation/Other Assessments   Observations woman sitting in wheelchair with O2 via nasal canula; quiet husband occasionally adds to the conversation     Functional Tests   Functional tests Sit to  Stand     Sit to Stand   Comments 11 reps in 30 seconds, about average for age  with O2 in place; moderate dyspnea following     Posture/Postural Control   Posture/Postural Control Postural limitations   Postural Limitations --  shoulders elevated     ROM / Strength   AROM / PROM / Strength AROM     AROM   Overall AROM Comments standing trunk AROM WFL throughout     Ambulation/Gait   Ambulation/Gait Yes   Ambulation/Gait Assistance 6: Modified independent (Device/Increase time)  needs O2   Gait Comments she is in a wheelchair today at the [facility], and reports this is because she is anxious and may have needed it for distance too     Balance   Balance Assessed Yes     Dynamic Standing Balance   Dynamic Standing - Comments reaches forward 9 inches in standing, below average for age                           PT Education - 04/15/16 1648    Education provided Yes   Education Details energy conservation, walking, Cure article on staying active, posture, breathing, PT info   Person(s) Educated Patient;Spouse   Methods Explanation;Handout   Comprehension Verbalized understanding               Lung Clinic Goals - 04/15/16 1653      Patient will be able to verbalize understanding of the benefit of exercise to decrease fatigue.   Status Achieved     Patient will be able to verbalize the importance of posture.   Status Achieved     Patient will be able to demonstrate diaphragmatic breathing for improved lung function.   Status Achieved     Patient will be able to verbalize understanding of the role of physical therapy to prevent functional decline and who to contact if physical therapy is needed.   Status Achieved             Plan - 04/15/16 1648    Clinical Impression Statement This is a very pleasant woman looking [place] than her age who is on oxygen fulltime.  She has been through pulmonary rehab for COPD.  She now has a diagnosis  of left upper lobe adenocarcinoma and expects to have XRT (SBRT). She is limited by shortness of breath; posture is abnormal today, and forward reach is limited.  Eval is moderate complexity due to comorbidities of COPD, arthritis; status is evolving with new lung cancer diagnosis and about to undergo treatment.   Rehab Potential Fair   PT  Frequency One time visit   PT Treatment/Interventions Patient/family education   PT Next Visit Plan None at this time.   PT Home Exercise Plan walking program, breathing exercises   Consulted and Agree with Plan of Care Patient      Patient will benefit from skilled therapeutic intervention in order to improve the following deficits and impairments:  Cardiopulmonary status limiting activity, Decreased balance, Postural dysfunction  Visit Diagnosis: Abnormal posture - Plan: PT plan of care cert/re-cert  Difficulty in walking, not elsewhere classified - Plan: PT plan of care cert/re-cert      G-Codes - 29/19/16 1653    Functional Assessment Tool Used (Outpatient Only) clinical judgement   Functional Limitation Mobility: Walking and moving around  with need for O2   Mobility: Walking and Moving Around Current Status (O0600) At least 20 percent but less than 40 percent impaired, limited or restricted   Mobility: Walking and Moving Around Goal Status [phone]) At least 20 percent but less than 40 percent impaired, limited or restricted   Mobility: Walking and Moving Around Discharge Status [phone]) At least 20 percent but less than 40 percent impaired, limited or restricted       Problem List Patient Active Problem List   Diagnosis Date Noted  . Adenocarcinoma of left lung, stage 1 ([place]) 04/15/2016  . Solitary pulmonary nodule on lung CT 11/20/2015  . Atherosclerosis of native arteries of extremity with intermittent claudication ([place]) 10/23/2015  . COPD GOLD III/ 02 dep  10/10/2014  . Chronic respiratory failure with hypoxia ([place]) 10/10/2014  . Acute  exacerbation of chronic obstructive pulmonary disease (COPD) ([place]) 09/02/2014  . Acute bronchitis 09/02/2014  . Essential hypertension 06/07/2009  . RHINOSINUSITIS, CHRONIC 06/04/2009    Suzanne Dickson 04/15/2016, 4:56 PM  [place], [place], [address] Phone: [phone]   Fax:  [phone]  Name: Suzanne Dickson MRN: [id number] Date of Birth: May 27, 1945  Suzanne Dickson, PT 04/15/16 4:57 PM

## 2016-04-15 NOTE — Progress Notes (Signed)
[place] Clinical Social Work  Clinical Social Work met with patient/family at [employer] appointment to offer support and assess for psychosocial needs.  Patient was accompanied by her spouse.  Suzanne Dickson lives in [facility] area and identified her children/family as her primary support system.  She shared she feels somewhat anxious being at the cancer center and learning of treatment, but has no concerns at this time.     ONCBCN DISTRESS SCREENING 04/15/2016  Distress experienced in past week (1-10) 6  Emotional problem type Nervousness/Anxiety;Adjusting to illness  Physical Problem type Breathing;Swollen arms/legs  Physician notified of physical symptoms Yes     Clinical Social Work briefly discussed Clinical Social Work role and [place] support programs/services.  Clinical Social Work encouraged patient to call with any additional questions or concerns.   Maryjean Morn, MSW, LCSW, OSW-C Clinical Social Worker [facility] [phone]

## 2016-04-16 ENCOUNTER — Encounter: Payer: Self-pay | Admitting: *Deleted

## 2016-04-16 ENCOUNTER — Telehealth: Payer: Self-pay | Admitting: Internal Medicine

## 2016-04-16 MED ORDER — ALBUTEROL SULFATE (2.5 MG/3ML) 0.083% IN NEBU: 2.5000 mg | INHALATION_SOLUTION | RESPIRATORY_TRACT | 12 refills | Status: DC | PRN

## 2016-04-16 NOTE — Telephone Encounter (Signed)
Spoke with [employer], per [employer] they will not pay for rx that states prn on it, Pharmacist faxed over corrected rxs and corrected quanity and Dr. Gustavus Bryant signed forms and they were faxed to [phone]. Got confirmation that fax was successful. Will close this message

## 2016-04-16 NOTE — Progress Notes (Signed)
Oncology Nurse Navigator Documentation  Oncology Nurse Navigator Flowsheets 04/16/2016  Navigator Location [facility]  Navigator Encounter Type Clinic/MDC;Other/I spoke with patient and her husband yesterday.  I gave and explained information on lung cancer, resources at cancer center and [place], as well as next steps.   I contacted [facility] today to update them on Ms. Shave and that Dr. Sondra Come would like patient to have [facility] next week.   Abnormal Finding Date 02/03/2016  Confirmed Diagnosis Date 03/31/2016  Multidisiplinary Clinic Date 04/15/2016  Patient Visit Type MedOnc  Treatment Phase Pre-Tx/Tx Discussion  Barriers/Navigation Needs Education;Coordination of Care  Education Newly Diagnosed Cancer Education;Other  Interventions Coordination of Care;Education  Coordination of Care Other  Education Method Verbal;Written  Support Groups/Services [employer]  Acuity Level 2  Acuity Level 2 Educational needs;Assistance expediting appointments  Time Spent with Patient 33

## 2016-04-20 ENCOUNTER — Encounter: Payer: Self-pay | Admitting: Family

## 2016-04-22 ENCOUNTER — Ambulatory Visit
Admission: RE | Admit: 2016-04-22 | Discharge: 2016-04-22 | Disposition: A | Discharge status: Home / Self Care | Payer: Medicare Other | Source: Ambulatory Visit | Attending: Radiation Oncology | Admitting: Radiation Oncology

## 2016-04-22 DIAGNOSIS — Z79899 Other long term (current) drug therapy: Secondary | ICD-10-CM | POA: Diagnosis not present

## 2016-04-22 DIAGNOSIS — Y842 Radiological procedure and radiotherapy as the cause of abnormal reaction of the patient, or of later complication, without mention of misadventure at the time of the procedure: Secondary | ICD-10-CM | POA: Insufficient documentation

## 2016-04-22 DIAGNOSIS — R0602 Shortness of breath: Secondary | ICD-10-CM | POA: Diagnosis not present

## 2016-04-22 DIAGNOSIS — Z7982 Long term (current) use of aspirin: Secondary | ICD-10-CM | POA: Diagnosis not present

## 2016-04-22 DIAGNOSIS — R05 Cough: Secondary | ICD-10-CM | POA: Diagnosis not present

## 2016-04-22 DIAGNOSIS — Z510 Encounter for antineoplastic radiation therapy: Secondary | ICD-10-CM | POA: Insufficient documentation

## 2016-04-22 DIAGNOSIS — C3492 Malignant neoplasm of unspecified part of left bronchus or lung: Secondary | ICD-10-CM

## 2016-04-22 NOTE — Progress Notes (Signed)
  Radiation Oncology         [phone] ________________________________  Name: Suzanne Dickson MRN: [id number]  Date: 04/22/2016  DOB: 10-23-1945   STEREOTACTIC BODY RADIOTHERAPY SIMULATION AND TREATMENT PLANNING NOTE    DIAGNOSIS:  Adenocarcinoma of the left lung, Stage 1 (HCC)  NARRATIVE:  The patient was brought to the [place].  Identity was confirmed.  All relevant records and images related to the planned course of therapy were reviewed.  The patient freely provided informed written consent to proceed with treatment after reviewing the details related to the planned course of therapy. The consent form was witnessed and verified by the simulation staff.  Then, the patient was set-up in a stable reproducible  supine position for radiation therapy.  A BodyFix immobilization pillow was fabricated for reproducible positioning.  Then I personally applied the abdominal compression paddle to limit respiratory excursion.  4D respiratoy motion management CT images were obtained.  Surface markings were placed.  The CT images were loaded into the planning software.  Then, using Cine, MIP, and standard views, the internal target volume (ITV) and planning target volumes (PTV) were delinieated, and avoidance structures were contoured.  Treatment planning then occurred.  The radiation prescription was entered and confirmed.  A total of two complex treatment devices were fabricated in the form of the BodyFix immobilization pillow and a neck accuform cushion.  I have requested : 3D Simulation  I have requested a DVH of the following structures: Heart, Lungs, Esophagus, Chest Wall, Brachial Plexus, Major Blood Vessels, and targets.  PLAN:  The patient will receive 54 Gy in 3 fractions.  -----------------------------------  Blair Promise, PhD, MD  This document serves as a record of services personally performed by Gery Pray, MD. It was created on his behalf by Maryla Morrow, a  trained medical scribe. The creation of this record is based on the scribe's personal observations and the provider's statements to them. This document has been checked and approved by the attending provider.

## 2016-04-27 ENCOUNTER — Telehealth: Payer: Self-pay | Admitting: Internal Medicine

## 2016-04-28 NOTE — Telephone Encounter (Signed)
erica please advise if we may close this message in error.  No message was placed in the chart. thanks

## 2016-04-30 ENCOUNTER — Encounter (HOSPITAL_COMMUNITY): Payer: Medicare Other

## 2016-04-30 ENCOUNTER — Ambulatory Visit: Payer: Medicare Other | Admitting: Family

## 2016-04-30 DIAGNOSIS — Z510 Encounter for antineoplastic radiation therapy: Secondary | ICD-10-CM | POA: Diagnosis not present

## 2016-05-04 ENCOUNTER — Ambulatory Visit: Payer: Medicare Other | Admitting: Internal Medicine

## 2016-05-04 ENCOUNTER — Telehealth: Payer: Self-pay | Admitting: Internal Medicine

## 2016-05-04 ENCOUNTER — Ambulatory Visit
Admission: RE | Admit: 2016-05-04 | Discharge: 2016-05-04 | Disposition: A | Discharge status: Home / Self Care | Payer: Medicare Other | Source: Ambulatory Visit | Attending: Radiation Oncology | Admitting: Radiation Oncology

## 2016-05-04 ENCOUNTER — Ambulatory Visit: Admission: RE | Admit: 2016-05-04 | Payer: Medicare Other | Source: Ambulatory Visit | Admitting: Radiation Oncology

## 2016-05-04 DIAGNOSIS — C3492 Malignant neoplasm of unspecified part of left bronchus or lung: Secondary | ICD-10-CM

## 2016-05-04 DIAGNOSIS — Z510 Encounter for antineoplastic radiation therapy: Secondary | ICD-10-CM | POA: Diagnosis not present

## 2016-05-04 NOTE — Telephone Encounter (Signed)
Form placed in MW's lookat to be signed

## 2016-05-04 NOTE — Telephone Encounter (Signed)
lmomtcb x1 

## 2016-05-04 NOTE — Progress Notes (Signed)
  Radiation Oncology         [phone] ________________________________  Name: Crystallynn Noorani Andon MRN: [id number]  Date: 05/04/2016  DOB: [birth year]  Stereotactic Body Radiotherapy Treatment Procedure Note  NARRATIVE:  Micheline Markes Clason was brought to the stereotactic radiation treatment machine and placed supine on the CT couch. The patient was set up for stereotactic body radiotherapy on the body fix pillow.  3D TREATMENT PLANNING AND DOSIMETRY:  The patient's radiation plan was reviewed and approved prior to starting treatment.  It showed 3-dimensional radiation distributions overlaid onto the planning CT.  The [facility] for the target structures as well as the organs at risk were reviewed. The documentation of this is filed in the radiation oncology EMR.  SIMULATION VERIFICATION:  The patient underwent CT imaging on the treatment unit.  These were carefully aligned to document that the ablative radiation dose would cover the target volume and maximally spare the nearby organs at risk according to the planned distribution.  SPECIAL TREATMENT PROCEDURE: Zniyah G Maddalena received high dose ablative stereotactic body radiotherapy to the planned target volume without unforeseen complications. Treatment was delivered uneventfully. The high doses associated with stereotactic body radiotherapy and the significant potential risks require careful treatment set up and patient monitoring constituting a special treatment procedure   STEREOTACTIC TREATMENT MANAGEMENT:  Following delivery, the patient was evaluated clinically. The patient tolerated treatment without significant acute effects, and was discharged to home in stable condition.    PLAN: Continue treatment as planned.  ________________________________  Blair Promise, PhD, MD   This document serves as a record of services personally performed by Gery Pray, MD. It was created on his behalf by Darcus Austin, a trained medical scribe. The creation  of this record is based on the scribe's personal observations and the provider's statements to them. This document has been checked and approved by the attending provider.

## 2016-05-05 ENCOUNTER — Ambulatory Visit: Payer: Medicare Other | Admitting: Radiation Oncology

## 2016-05-05 NOTE — Telephone Encounter (Signed)
Still awaiting MW's signature. Will route to [place] to f/u on.

## 2016-05-05 NOTE — Telephone Encounter (Signed)
Form done on our part, but pt needs to sign the second page  I spoke with her and let ger know this  She is going to come and sign and then I will fax  Will hold in my basket until done  Form up front

## 2016-05-06 ENCOUNTER — Ambulatory Visit: Payer: Medicare Other | Admitting: Radiation Oncology

## 2016-05-07 ENCOUNTER — Ambulatory Visit: Payer: Medicare Other | Admitting: Radiation Oncology

## 2016-05-07 ENCOUNTER — Ambulatory Visit
Admission: RE | Admit: 2016-05-07 | Discharge: 2016-05-07 | Disposition: A | Discharge status: Home / Self Care | Payer: Medicare Other | Source: Ambulatory Visit | Attending: Radiation Oncology | Admitting: Radiation Oncology

## 2016-05-07 DIAGNOSIS — Z510 Encounter for antineoplastic radiation therapy: Secondary | ICD-10-CM | POA: Diagnosis not present

## 2016-05-07 NOTE — Telephone Encounter (Signed)
Forms signed, faxed and placed in scan folder  Pt aware

## 2016-05-10 ENCOUNTER — Ambulatory Visit: Payer: Medicare Other | Admitting: Radiation Oncology

## 2016-05-11 ENCOUNTER — Ambulatory Visit: Payer: Medicare Other | Admitting: Radiation Oncology

## 2016-05-12 ENCOUNTER — Ambulatory Visit: Payer: Medicare Other | Admitting: Radiation Oncology

## 2016-05-12 ENCOUNTER — Ambulatory Visit
Admission: RE | Admit: 2016-05-12 | Discharge: 2016-05-12 | Disposition: A | Discharge status: Home / Self Care | Payer: Medicare Other | Source: Ambulatory Visit | Attending: Radiation Oncology | Admitting: Radiation Oncology

## 2016-05-12 DIAGNOSIS — Z510 Encounter for antineoplastic radiation therapy: Secondary | ICD-10-CM | POA: Diagnosis not present

## 2016-05-12 DIAGNOSIS — C3492 Malignant neoplasm of unspecified part of left bronchus or lung: Secondary | ICD-10-CM

## 2016-05-12 NOTE — Progress Notes (Signed)
  Radiation Oncology         [phone] ________________________________  Name: Suzanne Dickson MRN: [id number]  Date: 05/12/2016  DOB: 01/12/1946  Stereotactic Body Radiotherapy Treatment Procedure Note  NARRATIVE:  Suzanne Dickson was brought to the stereotactic radiation treatment machine and placed supine on the CT couch. The patient was set up for stereotactic body radiotherapy on the body fix pillow.  3D TREATMENT PLANNING AND DOSIMETRY:  The patient's radiation plan was reviewed and approved prior to starting treatment.  It showed 3-dimensional radiation distributions overlaid onto the planning CT.  The [facility] for the target structures as well as the organs at risk were reviewed. The documentation of this is filed in the radiation oncology EMR.  SIMULATION VERIFICATION:  The patient underwent CT imaging on the treatment unit.  These were carefully aligned to document that the ablative radiation dose would cover the target volume and maximally spare the nearby organs at risk according to the planned distribution.  SPECIAL TREATMENT PROCEDURE: Suzanne Dickson received high dose ablative stereotactic body radiotherapy to the planned target volume without unforeseen complications. Treatment was delivered uneventfully. The high doses associated with stereotactic body radiotherapy and the significant potential risks require careful treatment set up and patient monitoring constituting a special treatment procedure   STEREOTACTIC TREATMENT MANAGEMENT:  Following delivery, the patient was evaluated clinically. The patient tolerated treatment without significant acute effects, and was discharged to home in stable condition.    PLAN: Continue treatment as planned.  ________________________________  Blair Promise, PhD, MD   This document serves as a record of services personally performed by Gery Pray, MD. It was created on his behalf by Bethann Humble, a trained medical scribe. The creation  of this record is based on the scribe's personal observations and the provider's statements to them. This document has been checked and approved by the attending provider.

## 2016-05-13 ENCOUNTER — Encounter: Payer: Self-pay | Admitting: Radiation Oncology

## 2016-05-13 NOTE — Progress Notes (Signed)
  Radiation Oncology         [phone] ________________________________  Name: Suzanne Dickson MRN: [id number]  Date: 05/13/2016  DOB: November 13, 1945  End of Treatment Note  Diagnosis:   Adenocarcinoma of the left lung, Stage 1 ([place])    Indication for treatment:  Curative       Radiation treatment dates:   05/04/16 - 05/12/16  Site/dose:   Left lung treated to 54 Gy with 3 fx of 18 Gy  Beams/energy: SBRT/SRT-VMAT  // 6X  Narrative: The patient tolerated radiation treatment relatively well.     Plan: The patient has completed radiation treatment. The patient will return to radiation oncology clinic for routine followup in one month. I advised them to call or return sooner if they have any questions or concerns related to their recovery or treatment.  -----------------------------------  Blair Promise, PhD, MD  This document serves as a record of services personally performed by Gery Pray, MD. It was created on his behalf by Linward Natal, a trained medical scribe. The creation of this record is based on the scribe's personal observations and the provider's statements to them. This document has been checked and approved by the attending provider.

## 2016-05-14 ENCOUNTER — Ambulatory Visit: Payer: Medicare Other | Admitting: Radiation Oncology

## 2016-05-17 ENCOUNTER — Ambulatory Visit: Payer: Medicare Other | Admitting: Internal Medicine

## 2016-05-18 ENCOUNTER — Ambulatory Visit (INDEPENDENT_AMBULATORY_CARE_PROVIDER_SITE_OTHER): Payer: Medicare Other | Admitting: Internal Medicine

## 2016-05-18 ENCOUNTER — Encounter: Payer: Self-pay | Admitting: Internal Medicine

## 2016-05-18 VITALS — BP 150/66 | HR 80 | Ht 62.0 in | Wt 161.0 lb

## 2016-05-18 DIAGNOSIS — J9611 Chronic respiratory failure with hypoxia: Secondary | ICD-10-CM | POA: Diagnosis not present

## 2016-05-18 DIAGNOSIS — R911 Solitary pulmonary nodule: Secondary | ICD-10-CM

## 2016-05-18 DIAGNOSIS — J449 Chronic obstructive pulmonary disease, unspecified: Secondary | ICD-10-CM

## 2016-05-18 NOTE — Patient Instructions (Addendum)
Plan A = Automatic = Bevespi   Take 2 puffs first thing in am and then another 2 puffs about 12 hours later.    Work on inhaler technique:  relax and gently blow all the way out then take a nice smooth deep breath back in, triggering the inhaler at same time you start breathing in.  Hold for up to 5 seconds if you can. Blow out thru nose. Rinse and gargle with water when done      Plan B = Backup Only use your albuterol as a rescue medication to be used if you can't catch your breath by resting or doing a relaxed purse lip breathing pattern.  - The less you use it, the better it will work when you need it. - Ok to use the inhaler up to 2 puffs  every 4 hours if you must but call for appointment if use goes up over your usual need - Don't leave home without it !!  (think of it like the spare tire for your car)   Plan C = Crisis - only use your albuterol nebulizer if you first try Plan B and it fails to help > ok to use the nebulizer up to every 4 hours but if start needing it regularly call for immediate appointment     Please schedule a follow up visit in 3 months but call sooner if needed  with all medications /inhalers/ solutions in hand so we can verify exactly what you are taking. This includes all medications from all doctors and over the counters

## 2016-05-18 NOTE — Assessment & Plan Note (Signed)
Not present Sep 02 2015 See cxr 11/04/15 rx zpak > improved 11/19/2015 but not cleared   - no change 12/23/2015  > f/u 02/03/2016 still present  - CT chest 02/06/16 2.0 cm spiculated nodule in the left upper lobe, highly suspicious for primary bronchogenic carcinoma. >  PET 02/20/15 Malignant range FDG uptake is associated with the left upper lobe pulmonary nodule. 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

## 2016-05-18 NOTE — Assessment & Plan Note (Signed)
Quit smoking 08/2014 - PFT's  06/20/2015  FEV1 0.75 (44 % ) ratio 62  p no % improvement from saba p ? prior to study with DLCO  24 % corrects to 48 % for alv volume    - 12/09/2014 referred to rehab > started 12/2014  - 04/25/2015   try BEVESPI  - 06/20/2015  Changed by insurance> back on symb 160  - 09/22/2015 not happy with symbicort > changed back to besvespi 2bid  - 05/18/2016  After extensive coaching HFA effectiveness =    75% (Ti too short)  Pt continues to beGroup B in terms of symptom/risk and laba/lama therefore appropriate rx at this point > continue besvespi 2bid   I had an extended discussion with the patient reviewing all relevant studies completed to date and  lasting 15 to 20 minutes of a 25 minute visit    Each maintenance medication was reviewed in detail including most importantly the difference between maintenance and prns and under what circumstances the prns are to be triggered using an action plan format that is not reflected in the computer generated alphabetically organized AVS.    Please see AVS for specific instructions unique to this visit that I personally wrote and verbalized to the the pt in detail and then reviewed with pt  by my nurse highlighting any  changes in therapy recommended at today's visit to their plan of care.

## 2016-05-18 NOTE — Progress Notes (Signed)
Subjective:     Patient ID: Suzanne Dickson, female   DOB: 1945-02-13     MRN: [id number]    Brief patient profile:  47 yobf quit smoking on admit 09/02/14  and proved to have GOLD III copd 11/2014   Admit date: 09/02/2014 Discharge date: 09/04/2014   Discharge Diagnoses:  Principal Problem:  Obstructive chronic bronchitis with exacerbation Active Problems:  TOBACCO USER  Essential hypertension  RHINOSINUSITIS, CHRONIC  Chronic bronchitis  Acute bronchitis      Wt Readings from Last 3 Encounters:  09/04/14 72.96 kg (160 lb 13.6 oz)  07/30/09 69.854 kg (154 lb)  06/04/09 69.4 kg (153 lb)    History of present illness:  The patient is a 71 year old female with history of hypertension, bronchitis, chronic sinusitis, 40-pack-year smoking history who presented to the emergency department with shortness of breath and productive cough for 3 weeks prior to admission. Her cough became productive of thick yellow sputum, and her albuterol nebulizer was no longer offering her relief at home. In the emergency department, she had oxygen saturations in the low to mid 80s on room air was started on 2 L oxygen by nasal cannula. She was given IV steroids and DuoNeb's and was admitted for COPD exacerbation.  Hospital Course:   Acute hypoxic respiratory failure secondary to acute COPD exacerbation. She was started on Solu-Medrol, DuoNeb's, 2 L nasal cannula, and levofloxacin. She had rapid improvement of her subjective symptoms. She transitioned to room air on 8/9, but continued to have some difficulty breathing with exertion. On the date of discharge, she had normal oxygen saturations at rest on room air but when she exerted herself her oxygen saturations dropped to the mid 80s. She was prescribed 2 L home O2 to use with exertion. She should continue prednisone on a longer taper because she is still having some wheezing on exam. She should complete a 5 day course of levofloxacin. She  already has a nebulizer machine at home and was prescribed duo nebs for home. She was started on inhaled cortical steroids and long-acting beta agonist.  Essential hypertension, blood pressures were low normal. Her home blood pressure medications were held. She was advised to follow-up with her primary care doctor in a proximally 1 week for repeat blood pressure check. Her blood pressure medications may need to be resumed at that time.  Tobacco abuse with 40-pack-year history. Advised to quit smoking and provided a nicotine patch.  Mild steroid-induced hyperglycemia. Hemoglobin A1c is pending. Her steroids were tapered and I do not think that she will need any treatment for this at home.  Hyperkalemia was spurious. Her EKG demonstrated no evidence of peaked T waves and repeat BMP was back to normal without intervention.  Consultants:  None      10/10/2014 1st [facility] office visit/ Suzanne Dickson  / on ACEi since at least 2011 per chart review Chief Complaint  Patient presents with  . Pulmonary Consult    Self referral. Pt c/o increased SOB for the past month, worse x 1 wk. She gets out of breath walking to her mailbox and was out of breath walking from lobby to exam room today.   first aware of a resp problem in her 50/s = freq sinus infections and occ bad chest cold with flares but ok breathing between flares / saba just during flares but march 2016 downhill with short of breath with adls  And daily mostly dry cough eval by Dr Montez Morita and Dr Radene Journey but  notes not available and then abruptly worse prior to above admit and again worse now that off prednisone rec Stop lotrel permanently  Plan A = automatic = symbicort 160 Take 2 puffs first thing in am and then another 2 puffs about 12 hours later.  Plan B = Backup - Only use your duoneb  as a rescue medication  Work on inhaler technique:  Pantoprazole (protonix) 40 mg   Take  30-60 min before first meal of the day and Pepcid  (famotidine)  20 mg one @  bedtime until return to office       04/25/2015  f/u ov/Suzanne Dickson re: GOLD III / 02 dep hs and with exertion maint rx symbicort 160 2bid Chief Complaint  Patient presents with  . Follow-up    Pt states has good days and bad days depending on the weather. She has not needed Duoneb.   finished rehab/ feels it helped a lot and maintaining Doe [facility] = can't walk a nl pace on a flat grade s sob but ok flat and slow  rec Instead of symbicort try BEVESPI Take 2 puffs first thing in am and then another 2 puffs about 12 hours later.  Work on inhaler technique:  Ok to leave 02 off as you are and just use 2lpm at bedtime and 5lpm pulsed when exerting   06/20/2015  f/u ov/Suzanne Dickson re: GOLD III copd/ 02 dep hs and prn on bevespi about the same and not on her list  Chief Complaint  Patient presents with  . Follow-up    breathing doing well.  harder to breath during the rainy days.  only uses oxygen when active.    continues with doe = MMRC2 even if uses 02  rec Plan A = Automatic =  Symbicort 160 Take 2 puffs first thing in am and then another 2 puffs about 12 hours later.  Plan B = Backup  only use your albuterol nebulizer if you can't catch your breath/  ok to use the nebulizer up to every 4 hours but if start needing it regularly call for immediate appointment Please schedule a follow up visit in 3 months but call sooner if needed (bring your formulary in with you - this will give [place] options to choose the best for you)     09/22/2015  f/u ov/Suzanne Dickson re: copd III/ symb 1602bid on 2lpm at rest/ 4lpm ex  Chief Complaint  Patient presents with  . Follow-up    [place] had hemoptysis and increased SOB. She took round pred and augmentin and symptoms are some better. She has noticed minimal wheezing at night. She has used neb 3 x over the past 2 wks.   liked bevespi better but wasn't covered then and not doing as well on the symbicort  though hfa not ideal  - see a/p rec Plan A = Automatic =stop symbicort and restart Bevespi Take 2 puffs first thing in am and then another 2 puffs about 12 hours later.  Work on inhaler technique:     Plan B = Backup - only use your albuterol nebulizer if you first try Plan B and it fails to help > ok to use the nebulizer up to every 4 hours but if start needing it regularly call for immediate appointment    12/23/2015  f/u ov/Suzanne Dickson re: GOLD III copd/ maint bevespi/ f/u ? LUL pna  Chief Complaint  Patient presents with  .  Follow-up    Pt c/o increased SOB and congestion for the past 5 days. She states she started coughing up thick, yellow sputum over the past 2 days.    started with nasal congestion acutely  Then seemed to spread  down to the  chest with thick mucus/ more sob but comfortable p neb  rec Plan A = Automatic = Bevespi Take 2 puffs first thing in am and then another 2 puffs about 12 hours later.  Work on inhaler technique:  relax and gently blow all the way out then take a nice smooth deep breath back in, triggering the inhaler at same time you start breathing in.  Hold for up to 5 seconds if you can. Blow out thru nose. Rinse and gargle with water when done Plan B = Backup Only use your albuterol(proair)  as a rescue medication  Plan C = Crisis - only use your albuterol (ok to use up duoneb) nebulizer if you first try Plan B and it fails to help > ok to use the nebulizer up to every 4 hours but if start needing it regularly call for immediate appointment Augmentin 875 mg take one pill twice daily  X 10 days - take at breakfast and supper with large glass of water.  It would help reduce the usual side effects (diarrhea and yeast infections) if you ate cultured yogurt at lunch.  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 Follow-up    Breathing is "pretty good".  She states she finished RT. She is using her albuterol inhaler 2 x per wk on average and rarely uses neb.   MMRC2 = can't walk a nl pace on a flat grade s sob but does fine slow and flat eg [employer] shopping on 2lpm  No obvious day to day or daytime variability or assoc excess/ purulent sputum or mucus plugs or hemoptysis or cp or chest tightness, subjective wheeze or overt sinus or hb symptoms. No unusual exp hx or h/o childhood pna/ asthma or knowledge of premature birth.  Sleeping ok without nocturnal  or early am exacerbation  of respiratory  c/o's or need for noct saba. Also denies any obvious fluctuation of symptoms with weather or environmental changes or other aggravating or alleviating factors except as outlined above   Current Medications, Allergies, Complete Past Medical History, Past Surgical History, Family History, and Social History were reviewed in [employer] record.  ROS  The following are not active complaints unless bolded sore throat, dysphagia, dental problems, itching, sneezing,  nasal congestion or excess/ purulent secretions, ear ache,   fever, chills, sweats, unintended wt loss, classically pleuritic or exertional cp,  orthopnea pnd or leg swelling, presyncope, palpitations, abdominal pain, anorexia, nausea, vomiting, diarrhea  or  change in bowel or bladder habits, change in stools or urine, dysuria,hematuria,  rash, arthralgias, visual complaints, headache, numbness, weakness or ataxia  or problems with walking or coordination,  change in mood/affect or memory.             Objective:   Physical Exam  amb bf nad    ? Slt cushingnoid appearance    10/28/2014        160 > 12/09/2014 161 > 03/14/2015 158 >    04/25/2015  161 >  06/20/2015  158 > 09/22/2015   159 > 11/19/2015 159 > 12/23/2015   159  > 02/03/2016    163  > 05/18/2016   161     10/10/14 160 lb (72.576 kg)  09/16/14 156 lb (70.761 kg)  09/04/14 160 lb 13.6 oz (72.96 kg)    Vital signs reviewed - - Note on arrival 02 sats  92% on  2 pm continuous      HEENT: nl turbinates, and oropharynx. Nl external ear canals without cough reflex - upper and lower partials   NECK :  without JVD/Nodes/TM/ nl carotid upstrokes bilaterally   LUNGS: no acc muscle use,  Distant bs bilaterally with prolonged Texp   CV:  RRR  no s3 or murmur or increase in P2, no edema   ABD:  soft and nontender with nl excursion in the supine position. No bruits or organomegaly, bowel sounds nl  MS:  warm without deformities, calf tenderness, cyanosis or clubbing  SKIN: warm and dry without lesions    NEURO:  alert, approp, no deficits    I personally reviewed images and agree with radiology impression as follows:  CXR:   03/31/16  COPD. Known abnormal soft tissue mass inferiorly in the left upper lobe. No acute pneumonia.         Assessment:

## 2016-05-18 NOTE — Assessment & Plan Note (Signed)
Started on 02 2lpm at d/c 09/04/14  - 10/28/2014   Walked RA x one lap @ 185 stopped due to  desat to 82%  at nl pace so rec 02 2lpm with walking and sleeping  - 12/09/2014 referred for POC > did not qualify - RA at rest 04/25/2015  = 94%    As of 05/18/2016  2lpm at hs  and 4-5lpm pulsed walking any distance> Adequate control on present rx, reviewed in detail with pt > no change in rx needed

## 2016-05-22 ENCOUNTER — Other Ambulatory Visit: Payer: Self-pay | Admitting: Internal Medicine

## 2016-05-22 DIAGNOSIS — J449 Chronic obstructive pulmonary disease, unspecified: Secondary | ICD-10-CM

## 2016-06-07 ENCOUNTER — Telehealth: Payer: Self-pay | Admitting: *Deleted

## 2016-06-07 NOTE — Telephone Encounter (Signed)
Oncology Nurse Navigator Documentation  Oncology Nurse Navigator Flowsheets 06/07/2016  Navigator Location [facility]  Navigator Encounter Type Telephone/I called to follow up with Ms. Suzanne Dickson.  She has completed her SBRT TX.  I was unable to reach her. I left vm message with my name and phone number to call if needed.   Telephone Outgoing Call  Treatment Phase Post-Tx Follow-up  Interventions None required  Acuity Level 1  Time Spent with Patient 15

## 2016-06-10 ENCOUNTER — Ambulatory Visit: Payer: Medicare Other | Admitting: Radiation Oncology

## 2016-06-11 ENCOUNTER — Encounter: Payer: Self-pay | Admitting: Oncology

## 2016-06-14 ENCOUNTER — Ambulatory Visit
Admission: RE | Admit: 2016-06-14 | Discharge: 2016-06-14 | Disposition: A | Discharge status: Home / Self Care | Payer: Medicare Other | Source: Ambulatory Visit | Attending: Radiation Oncology | Admitting: Radiation Oncology

## 2016-06-14 ENCOUNTER — Encounter: Payer: Self-pay | Admitting: Radiation Oncology

## 2016-06-14 DIAGNOSIS — Z510 Encounter for antineoplastic radiation therapy: Secondary | ICD-10-CM | POA: Diagnosis not present

## 2016-06-14 DIAGNOSIS — C3492 Malignant neoplasm of unspecified part of left bronchus or lung: Secondary | ICD-10-CM

## 2016-06-14 NOTE — Progress Notes (Addendum)
Suzanne Dickson is here for follow up.  She denies having pain.  She reports having shortness of breath occasionally.  She says she has good days and bad days.  She reports having an occasional dry cough and denies having hemoptysis.  She is using 3L of oxygen and says she uses 4L with activity.  She reports that her energy comes and goes.  BP (!) 147/70 (BP Location: Right Arm, Patient Position: Sitting)   Pulse 84   Temp 98.4 F (36.9 C) (Oral)   Ht '5\' 2"'$  (1.575 m)   Wt 159 lb 9.6 oz (72.4 kg)   SpO2 95%   BMI 29.19 kg/m    Wt Readings from Last 3 Encounters:  06/14/16 159 lb 9.6 oz (72.4 kg)  05/18/16 161 lb (73 kg)  04/15/16 161 lb 9.6 oz (73.3 kg)

## 2016-06-14 NOTE — Progress Notes (Signed)
Radiation Oncology         [phone] ________________________________  Name: Suzanne Dickson MRN: [id number]  Date: 06/14/2016  DOB: Jul 29, 1945  Follow-Up Visit Note  CC: Wallene Huh, MD  Collene Gobble, MD    ICD-9-CM ICD-10-CM   1. Adenocarcinoma of left lung, stage 1 (HCC) 162.9 C34.92     Diagnosis: Adenocarcinoma of the left lung, Stage 1  Interval Since Last Radiation: 1 month 05/12/16-05/04/16: 54 Gy to the left lung in 3 fractions  Narrative:  The patient returns today for routine follow-up. She denies pain. She notes occasional shortness of breath and dry cough. She denies hemoptysis. She reports using 3L of oxygen and 4L with activity. She reports varying energy levels.                            ALLERGIES:  is allergic to no known allergies.  Meds: Current Outpatient Prescriptions  Medication Sig Dispense Refill  . albuterol (PROAIR HFA) 108 (90 Base) MCG/ACT inhaler Inhale 2 puffs into the lungs every 4 (four) hours as needed. 2 puffs every 4 hours as needed only  if your can't catch your breath    . albuterol (PROVENTIL) (2.5 MG/3ML) 0.083% nebulizer solution Take 3 mLs (2.5 mg total) by nebulization every 4 (four) hours as needed. For shortness of breath 75 mL 12  . amLODipine (NORVASC) 5 MG tablet Take 1 tablet by mouth daily.    Marland Kitchen aspirin 81 MG tablet Take 81 mg by mouth daily.    Marland Kitchen BEVESPI AEROSPHERE 9-4.8 MCG/ACT AERO inhale 2 puffs twice a day 10.7 g 11  . diphenhydrAMINE (SOMINEX) 25 MG tablet Take 25 mg by mouth at bedtime as needed for sleep.    . famotidine (PEPCID) 20 MG tablet Take 20 mg by mouth every evening.  0  . hydrochlorothiazide (MICROZIDE) 12.5 MG capsule Take 12.5 mg by mouth daily.  0  . ibuprofen (ADVIL,MOTRIN) 200 MG tablet Take 400 mg by mouth at bedtime.     . OXYGEN 2lpm with sleep and 4 lpm with exertion    . pantoprazole (PROTONIX) 40 MG tablet take 1 tablet by mouth once daily  TAKE 30-60 MIN BEFORE FIRST MEAL OF THE DAY 30  tablet 5  . sertraline (ZOLOFT) 100 MG tablet Take 100 mg by mouth 2 (two) times daily.  0   No current facility-administered medications for this encounter.     Physical Findings: The patient is in no acute distress. Patient is alert and oriented.  height is '5\' 2"'$  (1.575 m) and weight is 159 lb 9.6 oz (72.4 kg). Her oral temperature is 98.4 F (36.9 C). Her blood pressure is 147/70 (abnormal) and her pulse is 84. Her oxygen saturation is 95%. .  No significant changes. Lungs are clear to auscultation bilaterally. Heart has regular rate and rhythm. No palpable cervical, supraclavicular, or axillary adenopathy. Patient is on 3L/min oxygen via a nasal canula. She presents today in a wheelchair.  Lab Findings: Lab Results  Component Value Date   WBC 5.9 04/15/2016   HGB 9.4 (L) 04/15/2016   HCT 30.0 (L) 04/15/2016   MCV 79.6 04/15/2016   PLT 176 04/15/2016    Radiographic Findings: No results found.  Impression:  The patient is recovering from the effects of radiation.   Plan: Patient will follow-up in radiation oncology in 3 months with a CT scan to be scheduled shortly after that.  ____________________________________  This  document serves as a record of services personally performed by Gery Pray, MD. It was created on his behalf by Bethann Humble, a trained medical scribe. The creation of this record is based on the scribe's personal observations and the provider's statements to them. This document has been checked and approved by the attending provider.

## 2016-06-29 ENCOUNTER — Telehealth: Payer: Self-pay | Admitting: *Deleted

## 2016-06-29 NOTE — Telephone Encounter (Signed)
CALLED PATIENT TO INFORM OF FU APPT. ON 09-16-16 @ 9 AM WITH DR. KINARD, SPOKE WITH PATIENT AND SHE IS AWARE OF THIS APPT.

## 2016-08-09 ENCOUNTER — Telehealth: Payer: Self-pay | Admitting: Internal Medicine

## 2016-08-09 NOTE — Telephone Encounter (Signed)
comfirmed appointment change with patient 7/26 to 8/1

## 2016-08-13 ENCOUNTER — Other Ambulatory Visit (HOSPITAL_BASED_OUTPATIENT_CLINIC_OR_DEPARTMENT_OTHER): Payer: Medicare Other

## 2016-08-13 DIAGNOSIS — C3492 Malignant neoplasm of unspecified part of left bronchus or lung: Secondary | ICD-10-CM

## 2016-08-13 DIAGNOSIS — R911 Solitary pulmonary nodule: Secondary | ICD-10-CM

## 2016-08-13 DIAGNOSIS — C3412 Malignant neoplasm of upper lobe, left bronchus or lung: Secondary | ICD-10-CM

## 2016-08-13 LAB — CBC WITH DIFFERENTIAL/PLATELET
BASO%: 0.6 % (ref 0.0–2.0)
BASOS ABS: 0 10*3/uL (ref 0.0–0.1)
EOS ABS: 0.2 10*3/uL (ref 0.0–0.5)
EOS%: 3.8 % (ref 0.0–7.0)
HCT: 30.1 % — ABNORMAL LOW (ref 34.8–46.6)
HEMOGLOBIN: 9 g/dL — AB (ref 11.6–15.9)
LYMPH#: 1.4 10*3/uL (ref 0.9–3.3)
LYMPH%: 26.5 % (ref 14.0–49.7)
MCH: 23.6 pg — AB (ref 25.1–34.0)
MCHC: 29.9 g/dL — ABNORMAL LOW (ref 31.5–36.0)
MCV: 79.1 fL — ABNORMAL LOW (ref 79.5–101.0)
MONO#: 0.5 10*3/uL (ref 0.1–0.9)
MONO%: 9.3 % (ref 0.0–14.0)
NEUT#: 3.2 10*3/uL (ref 1.5–6.5)
NEUT%: 59.8 % (ref 38.4–76.8)
Platelets: 175 10*3/uL (ref 145–400)
RBC: 3.81 10*6/uL (ref 3.70–5.45)
RDW: 15.4 % — AB (ref 11.2–14.5)
WBC: 5.3 10*3/uL (ref 3.9–10.3)

## 2016-08-13 LAB — COMPREHENSIVE METABOLIC PANEL
ALBUMIN: 3.9 g/dL (ref 3.5–5.0)
ALK PHOS: 68 U/L (ref 40–150)
ALT: 14 U/L (ref 0–55)
AST: 20 U/L (ref 5–34)
Anion Gap: 9 mEq/L (ref 3–11)
BILIRUBIN TOTAL: 0.47 mg/dL (ref 0.20–1.20)
BUN: 15.8 mg/dL (ref 7.0–26.0)
CALCIUM: 9.1 mg/dL (ref 8.4–10.4)
CO2: 25 mEq/L (ref 22–29)
Chloride: 107 mEq/L (ref 98–109)
Creatinine: 1.1 mg/dL (ref 0.6–1.1)
EGFR: 56 mL/min/{1.73_m2} — ABNORMAL LOW (ref >90)
Glucose: 123 mg/dl (ref 70–140)
POTASSIUM: 3.4 meq/L — AB (ref 3.5–5.1)
Sodium: 141 mEq/L (ref 136–145)
Total Protein: 7.7 g/dL (ref 6.4–8.3)

## 2016-08-17 ENCOUNTER — Ambulatory Visit (INDEPENDENT_AMBULATORY_CARE_PROVIDER_SITE_OTHER): Payer: Medicare Other | Admitting: Internal Medicine

## 2016-08-17 ENCOUNTER — Encounter: Payer: Self-pay | Admitting: Internal Medicine

## 2016-08-17 ENCOUNTER — Ambulatory Visit (INDEPENDENT_AMBULATORY_CARE_PROVIDER_SITE_OTHER)
Admission: RE | Admit: 2016-08-17 | Discharge: 2016-08-17 | Disposition: A | Discharge status: Home / Self Care | Payer: Medicare Other | Source: Ambulatory Visit | Attending: Internal Medicine | Admitting: Internal Medicine

## 2016-08-17 VITALS — BP 140/70 | HR 100 | Ht 62.0 in | Wt 158.0 lb

## 2016-08-17 DIAGNOSIS — J449 Chronic obstructive pulmonary disease, unspecified: Secondary | ICD-10-CM | POA: Diagnosis not present

## 2016-08-17 DIAGNOSIS — J9611 Chronic respiratory failure with hypoxia: Secondary | ICD-10-CM | POA: Diagnosis not present

## 2016-08-17 MED ORDER — FAMOTIDINE 20 MG PO TABS: 20.0000 mg | ORAL_TABLET | Freq: Two times a day (BID) | ORAL | 11 refills | Status: DC

## 2016-08-17 MED ORDER — PREDNISONE 10 MG PO TABS: ORAL_TABLET | ORAL | 0 refills | Status: DC

## 2016-08-17 NOTE — Progress Notes (Signed)
Subjective:     Patient ID: Suzanne Dickson, female   DOB: 1945-02-13     MRN: [id number]    Brief patient profile:  47 yobf quit smoking on admit 09/02/14  and proved to have GOLD III copd 11/2014   Admit date: 09/02/2014 Discharge date: 09/04/2014   Discharge Diagnoses:  Principal Problem:  Obstructive chronic bronchitis with exacerbation Active Problems:  TOBACCO USER  Essential hypertension  RHINOSINUSITIS, CHRONIC  Chronic bronchitis  Acute bronchitis      Wt Readings from Last 3 Encounters:  09/04/14 72.96 kg (160 lb 13.6 oz)  07/30/09 69.854 kg (154 lb)  06/04/09 69.4 kg (153 lb)    History of present illness:  The patient is a 71 year old female with history of hypertension, bronchitis, chronic sinusitis, 40-pack-year smoking history who presented to the emergency department with shortness of breath and productive cough for 3 weeks prior to admission. Her cough became productive of thick yellow sputum, and her albuterol nebulizer was no longer offering her relief at home. In the emergency department, she had oxygen saturations in the low to mid 80s on room air was started on 2 L oxygen by nasal cannula. She was given IV steroids and DuoNeb's and was admitted for COPD exacerbation.  Hospital Course:   Acute hypoxic respiratory failure secondary to acute COPD exacerbation. She was started on Solu-Medrol, DuoNeb's, 2 L nasal cannula, and levofloxacin. She had rapid improvement of her subjective symptoms. She transitioned to room air on 8/9, but continued to have some difficulty breathing with exertion. On the date of discharge, she had normal oxygen saturations at rest on room air but when she exerted herself her oxygen saturations dropped to the mid 80s. She was prescribed 2 L home O2 to use with exertion. She should continue prednisone on a longer taper because she is still having some wheezing on exam. She should complete a 5 day course of levofloxacin. She  already has a nebulizer machine at home and was prescribed duo nebs for home. She was started on inhaled cortical steroids and long-acting beta agonist.  Essential hypertension, blood pressures were low normal. Her home blood pressure medications were held. She was advised to follow-up with her primary care doctor in a proximally 1 week for repeat blood pressure check. Her blood pressure medications may need to be resumed at that time.  Tobacco abuse with 40-pack-year history. Advised to quit smoking and provided a nicotine patch.  Mild steroid-induced hyperglycemia. Hemoglobin A1c is pending. Her steroids were tapered and I do not think that she will need any treatment for this at home.  Hyperkalemia was spurious. Her EKG demonstrated no evidence of peaked T waves and repeat BMP was back to normal without intervention.  Consultants:  None      10/10/2014 1st [facility] office visit/ Wert  / on ACEi since at least 2011 per chart review Chief Complaint  Patient presents with  . Pulmonary Consult    Self referral. Pt c/o increased SOB for the past month, worse x 1 wk. She gets out of breath walking to her mailbox and was out of breath walking from lobby to exam room today.   first aware of a resp problem in her 50/s = freq sinus infections and occ bad chest cold with flares but ok breathing between flares / saba just during flares but march 2016 downhill with short of breath with adls  And daily mostly dry cough eval by Dr Montez Morita and Dr Radene Journey but  notes not available and then abruptly worse prior to above admit and again worse now that off prednisone rec Stop lotrel permanently  Plan A = automatic = symbicort 160 Take 2 puffs first thing in am and then another 2 puffs about 12 hours later.  Plan B = Backup - Only use your duoneb  as a rescue medication  Work on inhaler technique:  Pantoprazole (protonix) 40 mg   Take  30-60 min before first meal of the day and Pepcid  (famotidine)  20 mg one @  bedtime until return to office       04/25/2015  f/u ov/Wert re: GOLD III / 02 dep hs and with exertion maint rx symbicort 160 2bid Chief Complaint  Patient presents with  . Follow-up    Pt states has good days and bad days depending on the weather. She has not needed Duoneb.   finished rehab/ feels it helped a lot and maintaining [facility] = can't walk a nl pace on a flat grade s sob but ok flat and slow  rec Instead of symbicort try BEVESPI Take 2 puffs first thing in am and then another 2 puffs about 12 hours later.  Work on inhaler technique:  Ok to leave 02 off as you are and just use 2lpm at bedtime and 5lpm pulsed when exerting   06/20/2015  f/u ov/Wert re: GOLD III copd/ 02 dep hs and prn on bevespi about the same and not on her list  Chief Complaint  Patient presents with  . Follow-up    breathing doing well.  harder to breath during the rainy days.  only uses oxygen when active.    continues with doe = MMRC2 even if uses 02  rec Plan A = Automatic =  Symbicort 160 Take 2 puffs first thing in am and then another 2 puffs about 12 hours later.  Plan B = Backup  only use your albuterol nebulizer if you can't catch your breath/  ok to use the nebulizer up to every 4 hours but if start needing it regularly call for immediate appointment Please schedule a follow up visit in 3 months but call sooner if needed (bring your formulary in with you - this will give [place] options to choose the best for you)     09/22/2015  f/u ov/Wert re: copd III/ symb 1602bid on 2lpm at rest/ 4lpm ex  Chief Complaint  Patient presents with  . Follow-up    [place] had hemoptysis and increased SOB. She took round pred and augmentin and symptoms are some better. She has noticed minimal wheezing at night. She has used neb 3 x over the past 2 wks.   liked bevespi better but wasn't covered then and not doing as well on the symbicort  though hfa not ideal  - see a/p rec Plan A = Automatic =stop symbicort and restart Bevespi Take 2 puffs first thing in am and then another 2 puffs about 12 hours later.  Work on inhaler technique:     Plan B = Backup - only use your albuterol nebulizer if you first try Plan B and it fails to help > ok to use the nebulizer up to every 4 hours but if start needing it regularly call for immediate appointment    12/23/2015  f/u ov/Wert re: GOLD III copd/ maint bevespi/ f/u ? LUL pna  Chief Complaint  Patient presents with  .  Follow-up    Pt c/o increased SOB and congestion for the past 5 days. She states she started coughing up thick, yellow sputum over the past 2 days.    started with nasal congestion acutely  Then seemed to spread  down to the  chest with thick mucus/ more sob but comfortable p neb  rec Plan A = Automatic = Bevespi Take 2 puffs first thing in am and then another 2 puffs about 12 hours later.  Work on inhaler technique:  relax and gently blow all the way out then take a nice smooth deep breath back in, triggering the inhaler at same time you start breathing in.  Hold for up to 5 seconds if you can. Blow out thru nose. Rinse and gargle with water when done Plan B = Backup Only use your albuterol(proair)  as a rescue medication  Plan C = Crisis - only use your albuterol (ok to use up duoneb) nebulizer if you first try Plan B and it fails to help > ok to use the nebulizer up to every 4 hours but if start needing it regularly call for immediate appointment Augmentin 875 mg take one pill twice daily  X 10 days - take at breakfast and supper with large glass of water.  It would help reduce the usual side effects (diarrhea and yeast infections) if you ate cultured yogurt at lunch.  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 > referred to Dr Lamonte Sakai 02/24/2016 >>>  FOB tbbx 03/31/16 Pos Adenoca > RT     05/18/2016  f/u ov/Wert re: gold III copd/ 02 dep/ bevespi 2bid  - tolerated RT well completed  05/12/16  Chief Complaint  Patient presents with  . Follow-up    Breathing is "pretty good".  She states she finished RT. She is using her albuterol inhaler 2 x per wk on average and rarely uses neb.   MMRC2 = can't walk a nl pace on a flat grade s sob but does fine slow and flat eg [employer] shopping on 2lpm rec Plan A = Automatic = Bevespi   Take 2 puffs first thing in am and then another 2 puffs about 12 hours later.  Work on inhaler technique:  Plan B = Backup Only use your albuterol as a rescue medication Plan C = Crisis - only use your albuterol nebulizer if you first try Plan B    08/17/2016  f/u ov/Wert re:  GOLD III copd/ bevespi  Chief Complaint  Patient presents with  . Follow-up    Pt states that her breathing has been worse for the past wk. She has been belching often. She has occ cough and wheezing.  She has been using her proair 3 x per wk on average. She typically does not need neb, but has used x 2 this past wk.   wearing 02 24/7 now / poor hfa and insight into 02 and rescue rx  Cough is dry/ daytime, assoc with worth belching since starting PPI   No obvious day to  day or daytime variability or assoc excess/ purulent sputum or mucus plugs or hemoptysis or cp or chest tightness, s e or overt sinus or hb symptoms. No unusual exp hx or h/o childhood pna/ asthma or knowledge of premature birth.  Sleeping ok on 2lpm  without nocturnal  or early am exacerbation  of respiratory  c/o's or need for noct saba. Also denies any obvious fluctuation of symptoms with weather or environmental changes or other  aggravating or alleviating factors except as outlined above   Current Medications, Allergies, Complete Past Medical History, Past Surgical History, Family History, and Social History were reviewed in [employer] record.  ROS  The following are not active complaints unless bolded sore throat, dysphagia, dental problems, itching, sneezing,  nasal congestion or excess/ purulent secretions, ear ache,   fever, chills, sweats, unintended wt loss, classically pleuritic or exertional cp,  orthopnea pnd or leg swelling, presyncope, palpitations, abdominal pain, anorexia, nausea, vomiting, diarrhea  or change in bowel or bladder habits, change in stools or urine, dysuria,hematuria,  rash, arthralgias, visual complaints, headache, numbness, weakness or ataxia or problems with walking or coordination,  change in mood/affect or memory.                Objective:   Physical Exam  amb bf nad   Slt cushingnoid appearance    10/28/2014        160 > 12/09/2014 161 > 03/14/2015 158 >    04/25/2015  161 >  06/20/2015  158 > 09/22/2015   159 > 11/19/2015 159 > 12/23/2015   159  > 02/03/2016    163  > 05/18/2016   161     10/10/14 160 lb (72.576 kg)  09/16/14 156 lb (70.761 kg)  09/04/14 160 lb 13.6 oz (72.96 kg)    Vital signs reviewed - - Note on arrival 02 sats  92% on  2lpm continuous      HEENT: nl turbinates, and oropharynx. Nl external ear canals without cough reflex - upper and lower partials   NECK :  without JVD/Nodes/TM/ nl carotid upstrokes bilaterally   LUNGS: no acc muscle use,  Distant bs bilaterally with prolonged Texp , very short Ti   CV:  RRR  no s3 or murmur or increase in P2, no edema   ABD:  soft and nontender with nl excursion in the supine position. No bruits or organomegaly, bowel sounds nl  MS:  warm without deformities, calf tenderness, cyanosis or clubbing  SKIN: warm and dry without lesions    NEURO:  alert, approp, no deficits    CXR PA and  Lateral:   08/17/2016 :    I personally reviewed images and agree with radiology impression as follows:    1. The left upper lobe nodule appears to have responded well to radiation treatment, no longer being visualized by chest x-ray. 2. No pneumonia or effusion.      Assessment:

## 2016-08-17 NOTE — Patient Instructions (Addendum)
Prednisone 10 mg take  4 each am x  2 days,  2 each am x 2 days,  1 each am x 2 days and stop   Try off protonix and change pepcid to  20 mg after bfast and after supper   02 2lpm 24/7  But ok to adjust up to keep saturations in low 90s if needed    Plan A = Automatic = Bevespi   Take 2 puffs first thing in am and then another 2 puffs about 12 hours later.   Work on inhaler technique:  relax and gently blow all the way out then take a nice smooth deep breath back in, triggering the inhaler at same time you start breathing in.  Hold for up to 5 seconds if you can. Blow out thru nose. Rinse and gargle with water when done     Plan B = Backup Only use your albuterol as a rescue medication to be used if you can't catch your breath by resting or doing a relaxed purse lip breathing pattern.  - The less you use it, the better it will work when you need it. - Ok to use the inhaler up to 2 puffs  every 4 hours if you must but call for appointment if use goes up over your usual need - Don't leave home without it !!  (think of it like the spare tire for your car)    Plan C = Crisis - only use your albuterol nebulizer if you first try Plan B and it fails to help > ok to use the nebulizer up to every 4 hours but if start needing it regularly call for immediate appointment    Please remember to go to the  x-ray department downstairs in the basement  for your tests - we will call you with the results when they are available.      Please schedule a follow up visit in 3 months but call sooner if needed  with all medications /inhalers/ solutions in hand so we can verify exactly what you are taking. This includes all medications from all doctors and over the counters

## 2016-08-18 NOTE — Progress Notes (Signed)
Pt requested that if cxr showed no change, to not call her. I will respect her wishes and not call her.

## 2016-08-18 NOTE — Assessment & Plan Note (Signed)
Quit smoking 08/2014 - PFT's  06/20/2015  FEV1 0.75 (44 % ) ratio 62  p no % improvement from saba p ? prior to study with DLCO  24 % corrects to 48 % for alv volume    - 12/09/2014 referred to rehab > started 12/2014  - 04/25/2015   try BEVESPI  - 06/20/2015  Changed by insurance> back on symb 160  - 09/22/2015 not happy with symbicort > changed back to besvespi 2bid  - 08/17/2016  After extensive coaching HFA effectiveness =    75% (Ti too short)  Continues with severe copd  = Group B in terms of symptom/risk and laba/lama therefore appropriate rx at this point. = bevesopi best choice  ? Mild flare ab > rx Prednisone 10 mg take  4 each am x 2 days,   2 each am x 2 days,  1 each am x 2 days and stop - if a lot better on vs off prednisone with have to consider triple rx and change to spiriva/symb vs trelegy  I had an extended discussion with the patient reviewing all relevant studies completed to date and  lasting 15 to 20 minutes of a 25 minute visit    Each maintenance medication was reviewed in detail including most importantly the difference between maintenance and prns and under what circumstances the prns are to be triggered using an action plan format that is not reflected in the computer generated alphabetically organized AVS.    Please see AVS for specific instructions unique to this visit that I personally wrote and verbalized to the the pt in detail and then reviewed with pt  by my nurse highlighting any  changes in therapy recommended at today's visit to their plan of care.

## 2016-08-18 NOTE — Assessment & Plan Note (Signed)
Started on 02 2lpm at d/c 09/04/14  - 10/28/2014   Walked RA x one lap @ 185 stopped due to  desat to 82%  at nl pace so rec 02 2lpm with walking and sleeping  - 12/09/2014 referred for POC > did not qualify - 08/17/2016 Patient Saturations on Room Air at Rest = 85%---increased to 93% on 2lpm o2  As of 08/17/2016  2lpm 24/7   But  4-5lpm pulsed walking any distance beyond room to room

## 2016-08-19 ENCOUNTER — Ambulatory Visit: Payer: Medicare Other | Admitting: Internal Medicine

## 2016-08-24 ENCOUNTER — Encounter (HOSPITAL_COMMUNITY): Payer: Self-pay

## 2016-08-24 ENCOUNTER — Ambulatory Visit (HOSPITAL_COMMUNITY)
Admission: RE | Admit: 2016-08-24 | Discharge: 2016-08-24 | Disposition: A | Discharge status: Home / Self Care | Payer: Medicare Other | Source: Ambulatory Visit | Attending: Internal Medicine | Admitting: Internal Medicine

## 2016-08-24 DIAGNOSIS — J432 Centrilobular emphysema: Secondary | ICD-10-CM | POA: Diagnosis not present

## 2016-08-24 DIAGNOSIS — C3492 Malignant neoplasm of unspecified part of left bronchus or lung: Secondary | ICD-10-CM | POA: Insufficient documentation

## 2016-08-24 DIAGNOSIS — I700 Atherosclerosis of aorta: Secondary | ICD-10-CM | POA: Diagnosis not present

## 2016-08-24 DIAGNOSIS — R911 Solitary pulmonary nodule: Secondary | ICD-10-CM

## 2016-08-24 DIAGNOSIS — K769 Liver disease, unspecified: Secondary | ICD-10-CM | POA: Diagnosis not present

## 2016-08-24 DIAGNOSIS — Z9049 Acquired absence of other specified parts of digestive tract: Secondary | ICD-10-CM | POA: Insufficient documentation

## 2016-08-24 DIAGNOSIS — N281 Cyst of kidney, acquired: Secondary | ICD-10-CM | POA: Diagnosis not present

## 2016-08-24 MED ORDER — IOPAMIDOL (ISOVUE-300) INJECTION 61%
75.0000 mL | Freq: Once | INTRAVENOUS | Status: AC | PRN
  Administered 2016-08-24: 75 mL via INTRAVENOUS

## 2016-08-24 MED ORDER — IOPAMIDOL (ISOVUE-300) INJECTION 61%
INTRAVENOUS | Status: AC
  Filled 2016-08-24: qty 75

## 2016-08-25 ENCOUNTER — Ambulatory Visit (HOSPITAL_BASED_OUTPATIENT_CLINIC_OR_DEPARTMENT_OTHER): Payer: Medicare Other | Admitting: Internal Medicine

## 2016-08-25 ENCOUNTER — Telehealth: Payer: Self-pay | Admitting: Internal Medicine

## 2016-08-25 ENCOUNTER — Encounter: Payer: Self-pay | Admitting: Internal Medicine

## 2016-08-25 VITALS — BP 163/67 | HR 105 | Temp 97.9°F | Resp 16 | Ht 62.0 in | Wt 159.0 lb

## 2016-08-25 DIAGNOSIS — C3412 Malignant neoplasm of upper lobe, left bronchus or lung: Secondary | ICD-10-CM

## 2016-08-25 DIAGNOSIS — C3492 Malignant neoplasm of unspecified part of left bronchus or lung: Secondary | ICD-10-CM

## 2016-08-25 DIAGNOSIS — J441 Chronic obstructive pulmonary disease with (acute) exacerbation: Secondary | ICD-10-CM

## 2016-08-25 NOTE — Progress Notes (Signed)
[place] [phone]   [phone]  OFFICE PROGRESS NOTE  Patient, No Pcp Per No address on file  DIAGNOSIS: Stage IA (T1a, N0, M0) non-small cell lung cancer, adenocarcinoma presented with left upper lobe lung nodule diagnosed in March 2018.  PRIOR THERAPY: S/P RT to the left lung nodule under the care of Dr. Sondra Come completed on 05/04/2016.  CURRENT THERAPY: Observation.  INTERVAL HISTORY: Suzanne Dickson 71 y.o. female returns to the clinic today for follow-up visit accompanied by her husband. The patient is feeling fine today was no specific complaints except for the baseline shortness breath and she is currently on home oxygen. She denied having any chest pain, cough or hemoptysis. She denied having any fever or chills. She has no nausea, vomiting, diarrhea or constipation. She is very anxious about her scan results. Her blood pressure is high today. She had repeat CT scan of the chest performed recently and she is here for evaluation and discussion of her scan results.  MEDICAL HISTORY: Past Medical History:  Diagnosis Date  . Adenocarcinoma of left lung, stage 1 ([place]) 04/15/2016  . Anxiety   . Arthritis   . Bronchitis, chronic ([place])   . COPD (chronic obstructive pulmonary disease) ([place])   . Depression   . Dyspnea    with exertion  . GERD (gastroesophageal reflux disease)   . History of blood transfusion    as a teen   . History of radiation therapy 05/04/16 - 05/12/16   Left lung treated to 54 Gy with 3 fx of 18 Gy  . Hypertension   . Pneumonia 10/2015    ALLERGIES:  is allergic to no known allergies.  MEDICATIONS:  Current Outpatient Prescriptions  Medication Sig Dispense Refill  . albuterol (PROAIR HFA) 108 (90 Base) MCG/ACT inhaler Inhale 2 puffs into the lungs every 4 (four) hours as needed. 2 puffs every 4 hours as needed only  if your can't catch your breath    . albuterol (PROVENTIL) (2.5 MG/3ML) 0.083% nebulizer solution  Take 3 mLs (2.5 mg total) by nebulization every 4 (four) hours as needed. For shortness of breath 75 mL 12  . amLODipine (NORVASC) 5 MG tablet Take 1 tablet by mouth daily.    Marland Kitchen aspirin 81 MG tablet Take 81 mg by mouth daily.    Marland Kitchen BEVESPI AEROSPHERE 9-4.8 MCG/ACT AERO inhale 2 puffs twice a day 10.7 g 11  . diphenhydrAMINE (SOMINEX) 25 MG tablet Take 25 mg by mouth at bedtime as needed for sleep.    . famotidine (PEPCID) 20 MG tablet Take 1 tablet (20 mg total) by mouth 2 (two) times daily. 60 tablet 11  . hydrochlorothiazide (MICROZIDE) 12.5 MG capsule Take 12.5 mg by mouth daily.  0  . ibuprofen (ADVIL,MOTRIN) 200 MG tablet Take 400 mg by mouth at bedtime.     . OXYGEN 2lpm with sleep and 4 lpm with exertion    . predniSONE (DELTASONE) 10 MG tablet Take  4 each am x 2 days,   2 each am x 2 days,  1 each am x 2 days and stop 14 tablet 0  . sertraline (ZOLOFT) 100 MG tablet Take 100 mg by mouth 2 (two) times daily.  0   No current facility-administered medications for this visit.     SURGICAL HISTORY:  Past Surgical History:  Procedure Laterality Date  . CHOLECYSTECTOMY    . COLONOSCOPY    . FUDUCIAL PLACEMENT Left 03/31/2016  Procedure: PLACEMENT OF FUDUCIAL LEFT UPPER LOBE;  Surgeon: Collene Gobble, MD;  Location: [facility];  Service: Thoracic;  Laterality: Left;  . SINUSOTOMY    . VIDEO BRONCHOSCOPY WITH ENDOBRONCHIAL NAVIGATION N/A 03/31/2016   Procedure: VIDEO BRONCHOSCOPY WITH ENDOBRONCHIAL NAVIGATION;  Surgeon: Collene Gobble, MD;  Location: [facility];  Service: Thoracic;  Laterality: N/A;    REVIEW OF SYSTEMS:  A comprehensive review of systems was negative except for: Constitutional: positive for fatigue Respiratory: positive for dyspnea on exertion   PHYSICAL EXAMINATION: General appearance: alert, cooperative, fatigued and no distress Head: Normocephalic, without obvious abnormality, atraumatic Neck: no adenopathy, no JVD, supple, symmetrical, trachea midline and thyroid not enlarged,  symmetric, no tenderness/mass/nodules Lymph nodes: Cervical, supraclavicular, and axillary nodes normal. Resp: wheezes bilaterally Back: symmetric, no curvature. ROM normal. No CVA tenderness. Cardio: regular rate and rhythm, S1, S2 normal, no murmur, click, rub or gallop GI: soft, non-tender; bowel sounds normal; no masses,  no organomegaly Extremities: extremities normal, atraumatic, no cyanosis or edema  ECOG PERFORMANCE STATUS: 1 - Symptomatic but completely ambulatory  Blood pressure (!) 163/67, pulse (!) 105, temperature 97.9 F (36.6 C), temperature source Oral, resp. rate 16, height 5\' 2"  (1.575 m), weight 159 lb (72.1 kg), SpO2 92 %.  LABORATORY DATA: Lab Results  Component Value Date   WBC 5.3 08/13/2016   HGB 9.0 (L) 08/13/2016   HCT 30.1 (L) 08/13/2016   MCV 79.1 (L) 08/13/2016   PLT 175 08/13/2016      Chemistry      Component Value Date/Time   NA 141 08/13/2016 1120   K 3.4 (L) 08/13/2016 1120   CL 104 03/29/2016 1200   CO2 25 08/13/2016 1120   BUN 15.8 08/13/2016 1120   CREATININE 1.1 08/13/2016 1120      Component Value Date/Time   CALCIUM 9.1 08/13/2016 1120   ALKPHOS 68 08/13/2016 1120   AST 20 08/13/2016 1120   ALT 14 08/13/2016 1120   BILITOT 0.47 08/13/2016 1120       RADIOGRAPHIC STUDIES: Dg Chest 2 View  Result Date: 08/17/2016 CLINICAL DATA:  Intermittent chest discomfort, history of left lung lesion with the radiation treatment, former smoking history EXAM: CHEST  2 VIEW COMPARISON:  Portable chest x-ray of 03/31/2016, and CT chest of 03/27/2015 FINDINGS: The nodular lesion within the left upper lobe is no longer visualized by chest x-ray. Three metallic markers are noted at the site of the previous nodule which appears to have responded well to radiation treatment. No pneumonia or pleural effusion is seen. Mediastinal and hilar contours are unremarkable. Heart size is stable. No bony abnormality is seen. IMPRESSION: 1. The left upper lobe nodule  appears to have responded well to radiation treatment, no longer being visualized by chest x-ray. 2. No pneumonia or effusion. Electronically Signed   By: Ivar Drape M.D.   On: 08/17/2016 15:40   Ct Chest W Contrast  Result Date: 08/25/2016 CLINICAL DATA:  Patient with history of lung cancer status post radiation therapy. EXAM: CT CHEST WITH CONTRAST TECHNIQUE: Multidetector CT imaging of the chest was performed during intravenous contrast administration. CONTRAST:  43mL ISOVUE-300 IOPAMIDOL (ISOVUE-300) INJECTION 61% COMPARISON:  CT chest 03/26/2016 FINDINGS: Cardiovascular: Heart is mildly enlarged. Coronary arterial vascular calcifications. Mediastinum/Nodes: There is an 8 mm low-attenuation lesion within the right lobe of the thyroid. No enlarged axillary, mediastinal or hilar lymphadenopathy. Normal esophagus. Lungs/Pleura: Central airways are patent. Dependent atelectasis within the left lower lobe. Interval decrease in size of  spiculated mass within the posterior left upper lobe measuring 1.7 x 1.4 cm (image 54; series 7), previously 1.9 x 2.1 cm. Centrilobular and paraseptal emphysematous change. No pleural effusion or pneumothorax. Upper Abdomen: Unchanged 7 mm low-attenuation lesion left hepatic lobe, too small to characterize however likely represents a small cyst. Status post cholecystectomy. Simple cyst superior pole right kidney. No acute process within the upper abdomen. Musculoskeletal: No aggressive or acute appearing osseous lesions. IMPRESSION: 1. Interval decrease in size of spiculated mass within the left upper lobe. 2. Aortic Atherosclerosis (ICD10-I70.0) and Emphysema (ICD10-J43.9). Electronically Signed   By: Lovey Newcomer M.D.   On: 08/25/2016 09:19    ASSESSMENT AND PLAN: This is a very pleasant 71 years old white female with a stage IA non-small cell lung cancer, adenocarcinoma presented with left upper lobe lung nodule. The patient is status post stereotactic radiotherapy to the  left upper lobe lung nodule and she tolerated the procedure well. Her recent CT scan of the chest showed improvement and decrease in the size of the left upper lobe nodule. I discussed the scan results with the patient and her husband who recommended for her to continue on observation with repeat CT scan of the chest in 6 months. For COPD, she will continue her care under Dr. Melvyn Novas. She was advised to call immediately if she has any concerning symptoms in the interval. The patient voices understanding of current disease status and treatment options and is in agreement with the current care plan. All questions were answered. The patient knows to call the clinic with any problems, questions or concerns. We can certainly see the patient much sooner if necessary.  I spent 10 minutes counseling the patient face to face. The total time spent in the appointment was 15 minutes.  Disclaimer: This note was dictated with voice recognition software. Similar sounding words can inadvertently be transcribed and may not be corrected upon review.

## 2016-08-25 NOTE — Telephone Encounter (Signed)
Scheduled appt per 8/1 los - Gave patient AVS and calender per los. - Central radiology to contact patient with ct scan

## 2016-09-10 ENCOUNTER — Telehealth: Payer: Self-pay | Admitting: Internal Medicine

## 2016-09-10 MED ORDER — PREDNISONE 10 MG PO TABS: ORAL_TABLET | ORAL | 0 refills | Status: DC

## 2016-09-10 NOTE — Telephone Encounter (Signed)
Prednisone 10 mg take  4 each am x 2 days,   2 each am x 2 days,  1 each am x 2 days and stop  Ov with all meds in hand next available

## 2016-09-10 NOTE — Telephone Encounter (Signed)
Pt is aware of MW's recommendations and voiced her understanding.  Rx has been sent to preferred pharmacy. Nothing further needed.

## 2016-09-10 NOTE — Telephone Encounter (Signed)
Called and spoke with pt. Pt reports of non prod cough, wheezing, chest/nasal congestion & increased sob x3d Denies fever, chill or sweats. Pt is taken OTC allergy relief with no improvement.  MW please advise. Thanks.  08/17/16 AVS Patient Instructions   Prednisone 10 mg take  4 each am x  2 days,  2 each am x 2 days,  1 each am x 2 days and stop   Try off protonix and change pepcid to  20 mg after bfast and after supper   02 2lpm 24/7  But ok to adjust up to keep saturations in low 90s if needed    Plan A = Automatic = Bevespi   Take 2 puffs first thing in am and then another 2 puffs about 12 hours later.   Work on inhaler technique:  relax and gently blow all the way out then take a nice smooth deep breath back in, triggering the inhaler at same time you start breathing in.  Hold for up to 5 seconds if you can. Blow out thru nose. Rinse and gargle with water when done     Plan B = Backup Only use your albuterol as a rescue medication to be used if you can't catch your breath by resting or doing a relaxed purse lip breathing pattern.  - The less you use it, the better it will work when you need it. - Ok to use the inhaler up to 2 puffs  every 4 hours if you must but call for appointment if use goes up over your usual need - Don't leave home without it !!  (think of it like the spare tire for your car)    Plan C = Crisis - only use your albuterol nebulizer if you first try Plan B and it fails to help > ok to use the nebulizer up to every 4 hours but if start needing it regularly call for immediate appointment    Please remember to go to the  x-ray department downstairs in the basement  for your tests - we will call you with the results when they are available.      Please schedule a follow up visit in 3 months but call sooner if needed  with all medications /inhalers/ solutions in hand so we can verify exactly what you are taking. This includes all medications from all  doctors and over the counters

## 2016-09-16 ENCOUNTER — Ambulatory Visit
Admission: RE | Admit: 2016-09-16 | Discharge: 2016-09-16 | Disposition: A | Discharge status: Home / Self Care | Payer: Medicare Other | Source: Ambulatory Visit | Attending: Radiation Oncology | Admitting: Radiation Oncology

## 2016-09-16 ENCOUNTER — Encounter: Payer: Self-pay | Admitting: Radiation Oncology

## 2016-09-16 VITALS — BP 167/62 | HR 83 | Temp 97.7°F | Ht 62.0 in | Wt 155.8 lb

## 2016-09-16 DIAGNOSIS — C3492 Malignant neoplasm of unspecified part of left bronchus or lung: Secondary | ICD-10-CM | POA: Insufficient documentation

## 2016-09-16 DIAGNOSIS — R911 Solitary pulmonary nodule: Secondary | ICD-10-CM | POA: Insufficient documentation

## 2016-09-16 NOTE — Progress Notes (Signed)
Suzanne Dickson is here for follow up. She denies having any pain.  She denies feeling short of breath today. She is using 3L of oxygen today.  She said she has been having trouble with her allergies and has been taking Claritin and a medrol dose pack by Dr. Melvyn Novas.  She has one prednisone tablet left.  She denies having a cough.  She reports having a good energy level.  BP (!) 167/62 (BP Location: Left Arm, Patient Position: Sitting)   Pulse 83   Temp 97.7 F (36.5 C) (Oral)   Ht 5\' 2"  (1.575 m)   Wt 155 lb 12.8 oz (70.7 kg)   SpO2 97% Comment: 3L  BMI 28.50 kg/m    Wt Readings from Last 3 Encounters:  09/16/16 155 lb 12.8 oz (70.7 kg)  08/25/16 159 lb (72.1 kg)  08/17/16 158 lb (71.7 kg)

## 2016-09-16 NOTE — Progress Notes (Signed)
Radiation Oncology         [phone] ________________________________  Name: Suzanne Dickson MRN: [id number]  Date: 09/16/2016  DOB: March 16, 1945    Follow-Up Visit Note  CC: Patient, No Pcp Per  Collene Gobble, MD    ICD-10-CM   1. Solitary pulmonary nodule on lung CT R91.1   2. Adenocarcinoma of left lung, stage 1 ([facility]) C34.92     Diagnosis: Adenocarcinoma of the left lung, Stage 1  Interval Since Last Radiation: 4 months 05/12/16-05/04/16: 54 Gy to the left lung in 3 fractions  Narrative:  The patient returns today for routine follow-up. She denies having any pain. She denies feeling short of breath today. She is using 3L of oxygen today. She said she has been having trouble with her allergies and has been taking Claritin and a medrol dose pack by Dr. Melvyn Novas. She has one prednisone tablet left. She denies having a cough. She reports having a good energy level. She denies chest pain, hemoptysis, or trouble swallowing. She plans to be more active as the weather cools off.   Repeat CT Chest on 08/25/2016 shows interval decrease in size of spiculated mass within the left upper lobe.  She is scheduled to see Dr. Melvyn Novas again on 11/17/2016. She is scheduled to see Dr. Julien Nordmann again on 02/24/2017 with repeat CT prior.                            ALLERGIES:  is allergic to no known allergies.  Meds: Current Outpatient Prescriptions  Medication Sig Dispense Refill  . albuterol (PROAIR HFA) 108 (90 Base) MCG/ACT inhaler Inhale 2 puffs into the lungs every 4 (four) hours as needed. 2 puffs every 4 hours as needed only  if your can't catch your breath    . albuterol (PROVENTIL) (2.5 MG/3ML) 0.083% nebulizer solution Take 3 mLs (2.5 mg total) by nebulization every 4 (four) hours as needed. For shortness of breath 75 mL 12  . amLODipine (NORVASC) 5 MG tablet Take 1 tablet by mouth daily.    Marland Kitchen aspirin 81 MG tablet Take 81 mg by mouth daily.    Marland Kitchen BEVESPI AEROSPHERE 9-4.8 MCG/ACT AERO inhale 2  puffs twice a day 10.7 g 11  . diphenhydrAMINE (SOMINEX) 25 MG tablet Take 25 mg by mouth at bedtime as needed for sleep.    . famotidine (PEPCID) 20 MG tablet Take 1 tablet (20 mg total) by mouth 2 (two) times daily. 60 tablet 11  . hydrochlorothiazide (MICROZIDE) 12.5 MG capsule Take 12.5 mg by mouth daily.  0  . ibuprofen (ADVIL,MOTRIN) 200 MG tablet Take 400 mg by mouth at bedtime.     . OXYGEN 2lpm with sleep and 4 lpm with exertion    . predniSONE (DELTASONE) 10 MG tablet 4 tabs x 2 days, 2 tabs x 2 days, 1 tab x 2 days 14 tablet 0  . sertraline (ZOLOFT) 100 MG tablet Take 100 mg by mouth 2 (two) times daily.  0   No current facility-administered medications for this encounter.     Physical Findings: The patient is in no acute distress. Patient is alert and oriented.  height is 5\' 2"  (1.575 m) and weight is 155 lb 12.8 oz (70.7 kg). Her oral temperature is 97.7 F (36.5 C). Her blood pressure is 167/62 (abnormal) and her pulse is 83. Her oxygen saturation is 97%. .  No significant changes. Lungs are clear to auscultation bilaterally. Heart  has regular rate and rhythm. No palpable cervical, supraclavicular, or axillary adenopathy. Patient is on 3L/min oxygen via a nasal canula. She presents today in a wheelchair.  Lab Findings: Lab Results  Component Value Date   WBC 5.3 08/13/2016   HGB 9.0 (L) 08/13/2016   HCT 30.1 (L) 08/13/2016   MCV 79.1 (L) 08/13/2016   PLT 175 08/13/2016    Radiographic Findings: Dg Chest 2 View  Result Date: 08/17/2016 CLINICAL DATA:  Intermittent chest discomfort, history of left lung lesion with the radiation treatment, former smoking history EXAM: CHEST  2 VIEW COMPARISON:  Portable chest x-ray of 03/31/2016, and CT chest of 03/27/2015 FINDINGS: The nodular lesion within the left upper lobe is no longer visualized by chest x-ray. Three metallic markers are noted at the site of the previous nodule which appears to have responded well to radiation  treatment. No pneumonia or pleural effusion is seen. Mediastinal and hilar contours are unremarkable. Heart size is stable. No bony abnormality is seen. IMPRESSION: 1. The left upper lobe nodule appears to have responded well to radiation treatment, no longer being visualized by chest x-ray. 2. No pneumonia or effusion. Electronically Signed   By: Ivar Drape M.D.   On: 08/17/2016 15:40   Ct Chest W Contrast  Result Date: 08/25/2016 CLINICAL DATA:  Patient with history of lung cancer status post radiation therapy. EXAM: CT CHEST WITH CONTRAST TECHNIQUE: Multidetector CT imaging of the chest was performed during intravenous contrast administration. CONTRAST:  63mL ISOVUE-300 IOPAMIDOL (ISOVUE-300) INJECTION 61% COMPARISON:  CT chest 03/26/2016 FINDINGS: Cardiovascular: Heart is mildly enlarged. Coronary arterial vascular calcifications. Mediastinum/Nodes: There is an 8 mm low-attenuation lesion within the right lobe of the thyroid. No enlarged axillary, mediastinal or hilar lymphadenopathy. Normal esophagus. Lungs/Pleura: Central airways are patent. Dependent atelectasis within the left lower lobe. Interval decrease in size of spiculated mass within the posterior left upper lobe measuring 1.7 x 1.4 cm (image 54; series 7), previously 1.9 x 2.1 cm. Centrilobular and paraseptal emphysematous change. No pleural effusion or pneumothorax. Upper Abdomen: Unchanged 7 mm low-attenuation lesion left hepatic lobe, too small to characterize however likely represents a small cyst. Status post cholecystectomy. Simple cyst superior pole right kidney. No acute process within the upper abdomen. Musculoskeletal: No aggressive or acute appearing osseous lesions. IMPRESSION: 1. Interval decrease in size of spiculated mass within the left upper lobe. 2. Aortic Atherosclerosis (ICD10-I70.0) and Emphysema (ICD10-J43.9). Electronically Signed   By: Lovey Newcomer M.D.   On: 08/25/2016 09:19    Impression:  Adenocarcinoma of the left  lung, Stage 1, clinical Stage I, s/p SBRT. She saw Dr. Julien Nordmann recently and had CT which showed shrinkage of area treated with SBRT. She has no new problems.  Plan: Patient is scheduled to see Dr. Julien Nordmann again on Thursday, 02/24/2017 with repeat CT prior. We will plan to follow up on 02/24/2017, as well.   -----------------------------------  Blair Promise, PhD, MD  This document serves as a record of services personally performed by Gery Pray, MD. It was created on his behalf by Arlyce Harman, a trained medical scribe. The creation of this record is based on the scribe's personal observations and the provider's statements to them. This document has been checked and approved by the attending provider.

## 2016-10-14 ENCOUNTER — Telehealth: Payer: Self-pay | Admitting: Internal Medicine

## 2016-10-14 MED ORDER — AMLODIPINE BESYLATE 5 MG PO TABS: 5.0000 mg | ORAL_TABLET | Freq: Every day | ORAL | 3 refills | Status: DC

## 2016-10-14 NOTE — Telephone Encounter (Signed)
That's fine but if she can't fine a pcp we need to refer her to the [facility] site nearest her

## 2016-10-14 NOTE — Telephone Encounter (Signed)
Last Ov 04/2016  Your recommendations were:  Please schedule a follow up visit in 3 months but call sooner if needed  with all medications /inhalers/ solutions in hand so we can verify exactly what you are taking. This includes all medications from all doctors and over the counters        Will you be willing to do this for pt.

## 2016-10-14 NOTE — Telephone Encounter (Signed)
Spoke with the pt and notified of recs per MW  She verbalized understanding  Rx was sent and she will let us know if she needs help finding a new PCP

## 2016-10-15 ENCOUNTER — Other Ambulatory Visit: Payer: Self-pay | Admitting: Internal Medicine

## 2016-10-18 ENCOUNTER — Telehealth: Payer: Self-pay | Admitting: Internal Medicine

## 2016-10-18 MED ORDER — AMLODIPINE BESYLATE 5 MG PO TABS: 5.0000 mg | ORAL_TABLET | Freq: Every day | ORAL | 3 refills | Status: DC

## 2016-10-18 NOTE — Telephone Encounter (Signed)
Pt calling back pharmacy [facility]  She wanted her bp meds sent in.  The pharmacy says they do not have it.  [phone]

## 2016-10-18 NOTE — Telephone Encounter (Signed)
Spoke with pt, she states Suzanne Dickson did not receive the Rx for Norvasc. I re-sent the Rx and pt understood. Nothing further is needed.

## 2016-11-17 ENCOUNTER — Encounter: Payer: Self-pay | Admitting: Internal Medicine

## 2016-11-17 ENCOUNTER — Ambulatory Visit (INDEPENDENT_AMBULATORY_CARE_PROVIDER_SITE_OTHER): Payer: Medicare Other | Admitting: Internal Medicine

## 2016-11-17 VITALS — BP 130/70 | HR 90 | Ht 64.0 in | Wt 154.0 lb

## 2016-11-17 DIAGNOSIS — J449 Chronic obstructive pulmonary disease, unspecified: Secondary | ICD-10-CM | POA: Diagnosis not present

## 2016-11-17 DIAGNOSIS — J9611 Chronic respiratory failure with hypoxia: Secondary | ICD-10-CM | POA: Diagnosis not present

## 2016-11-17 DIAGNOSIS — Z23 Encounter for immunization: Secondary | ICD-10-CM

## 2016-11-17 MED ORDER — ALBUTEROL SULFATE HFA 108 (90 BASE) MCG/ACT IN AERS: 2.0000 | INHALATION_SPRAY | RESPIRATORY_TRACT | 2 refills | Status: DC | PRN

## 2016-11-17 NOTE — Patient Instructions (Signed)
Work on inhaler technique:  relax and gently blow all the way out then take a nice smooth deep breath back in, triggering the inhaler at same time you start breathing in.  Hold for up to 5 seconds if you can.  Rinse and gargle with water when done      Please schedule a follow up visit in 3 months but call sooner if needed

## 2016-11-17 NOTE — Progress Notes (Signed)
Subjective:     Patient ID: Suzanne Dickson, female   DOB: 1945/08/29     MRN: [id number]    Brief patient profile:  63 yobf quit smoking on admit 09/02/14  and proved to have GOLD III copd 11/2014   Admit date: 09/02/2014 Discharge date: 09/04/2014   Discharge Diagnoses:  Principal Problem:  Obstructive chronic bronchitis with exacerbation Active Problems:  TOBACCO USER  Essential hypertension  RHINOSINUSITIS, CHRONIC  Chronic bronchitis  Acute bronchitis      Wt Readings from Last 3 Encounters:  09/04/14 72.96 kg (160 lb 13.6 oz)  07/30/09 69.854 kg (154 lb)  06/04/09 69.4 kg (153 lb)    History of present illness:  The patient is a 71 year old female with history of hypertension, bronchitis, chronic sinusitis, 40-pack-year smoking history who presented to the emergency department with shortness of breath and productive cough for 3 weeks prior to admission. Her cough became productive of thick yellow sputum, and her albuterol nebulizer was no longer offering her relief at home. In the emergency department, she had oxygen saturations in the low to mid 80s on room air was started on 2 L oxygen by nasal cannula. She was given IV steroids and DuoNeb's and was admitted for COPD exacerbation.  Hospital Course:   Acute hypoxic respiratory failure secondary to acute COPD exacerbation. She was started on Solu-Medrol, DuoNeb's, 2 L nasal cannula, and levofloxacin. She had rapid improvement of her subjective symptoms. She transitioned to room air on 8/9, but continued to have some difficulty breathing with exertion. On the date of discharge, she had normal oxygen saturations at rest on room air but when she exerted herself her oxygen saturations dropped to the mid 80s. She was prescribed 2 L home O2 to use with exertion. She should continue prednisone on a longer taper because she is still having some wheezing on exam. She should complete a 5 day course of levofloxacin. She  already has a nebulizer machine at home and was prescribed duo nebs for home. She was started on inhaled cortical steroids and long-acting beta agonist.  Essential hypertension, blood pressures were low normal. Her home blood pressure medications were held. She was advised to follow-up with her primary care doctor in a proximally 1 week for repeat blood pressure check. Her blood pressure medications may need to be resumed at that time.  Tobacco abuse with 40-pack-year history. Advised to quit smoking and provided a nicotine patch.  Mild steroid-induced hyperglycemia. Hemoglobin A1c is pending. Her steroids were tapered and I do not think that she will need any treatment for this at home.  Hyperkalemia was spurious. Her EKG demonstrated no evidence of peaked T waves and repeat BMP was back to normal without intervention.  Consultants:  None      10/10/2014 1st [facility] office visit/ Special Ranes  / on ACEi since at least 2011 per chart review Chief Complaint  Patient presents with  . Pulmonary Consult    Self referral. Pt c/o increased SOB for the past month, worse x 1 wk. She gets out of breath walking to her mailbox and was out of breath walking from lobby to exam room today.   first aware of a resp problem in her 50/s = freq sinus infections and occ bad chest cold with flares but ok breathing between flares / saba just during flares but march 2016 downhill with short of breath with adls  And daily mostly dry cough eval by Dr Montez Morita and Dr Radene Journey but  notes not available and then abruptly worse prior to above admit and again worse now that off prednisone rec Stop lotrel permanently  Plan A = automatic = symbicort 160 Take 2 puffs first thing in am and then another 2 puffs about 12 hours later.  Plan B = Backup - Only use your duoneb  as a rescue medication  Work on inhaler technique:  Pantoprazole (protonix) 40 mg   Take  30-60 min before first meal of the day and Pepcid  (famotidine)  20 mg one @  bedtime until return to office       04/25/2015  f/u ov/Alixandrea Milleson re: GOLD III / 02 dep hs and with exertion maint rx symbicort 160 2bid Chief Complaint  Patient presents with  . Follow-up    Pt states has good days and bad days depending on the weather. She has not needed Duoneb.   finished rehab/ feels it helped a lot and maintaining [facility] = can't walk a nl pace on a flat grade s sob but ok flat and slow  rec Instead of symbicort try BEVESPI Take 2 puffs first thing in am and then another 2 puffs about 12 hours later.  Work on inhaler technique:  Ok to leave 02 off as you are and just use 2lpm at bedtime and 5lpm pulsed when exerting   06/20/2015  f/u ov/Jaye Polidori re: GOLD III copd/ 02 dep hs and prn on bevespi about the same and not on her list  Chief Complaint  Patient presents with  . Follow-up    breathing doing well.  harder to breath during the rainy days.  only uses oxygen when active.    continues with doe = MMRC2 even if uses 02  rec Plan A = Automatic =  Symbicort 160 Take 2 puffs first thing in am and then another 2 puffs about 12 hours later.  Plan B = Backup  only use your albuterol nebulizer if you can't catch your breath/  ok to use the nebulizer up to every 4 hours but if start needing it regularly call for immediate appointment Please schedule a follow up visit in 3 months but call sooner if needed (bring your formulary in with you - this will give [place] options to choose the best for you)     09/22/2015  f/u ov/Elena Davia re: copd III/ symb 1602bid on 2lpm at rest/ 4lpm ex  Chief Complaint  Patient presents with  . Follow-up    [place] had hemoptysis and increased SOB. She took round pred and augmentin and symptoms are some better. She has noticed minimal wheezing at night. She has used neb 3 x over the past 2 wks.   liked bevespi better but wasn't covered then and not doing as well on the symbicort  though hfa not ideal  - see a/p rec Plan A = Automatic =stop symbicort and restart Bevespi Take 2 puffs first thing in am and then another 2 puffs about 12 hours later.  Work on inhaler technique:     Plan B = Backup - only use your albuterol nebulizer if you first try Plan B and it fails to help > ok to use the nebulizer up to every 4 hours but if start needing it regularly call for immediate appointment    12/23/2015  f/u ov/Yvonne Petite re: GOLD III copd/ maint bevespi/ f/u ? [facility] pna  Chief Complaint  Patient presents with  .  Follow-up    Pt c/o increased SOB and congestion for the past 5 days. She states she started coughing up thick, yellow sputum over the past 2 days.    started with nasal congestion acutely  Then seemed to spread  down to the  chest with thick mucus/ more sob but comfortable p neb  rec Plan A = Automatic = Bevespi Take 2 puffs first thing in am and then another 2 puffs about 12 hours later.  Work on inhaler technique:  relax and gently blow all the way out then take a nice smooth deep breath back in, triggering the inhaler at same time you start breathing in.  Hold for up to 5 seconds if you can. Blow out thru nose. Rinse and gargle with water when done Plan B = Backup Only use your albuterol(proair)  as a rescue medication  Plan C = Crisis - only use your albuterol (ok to use up duoneb) nebulizer if you first try Plan B and it fails to help > ok to use the nebulizer up to every 4 hours but if start needing it regularly call for immediate appointment Augmentin 875 mg take one pill twice daily  X 10 days - take at breakfast and supper with large glass of water.  It would help reduce the usual side effects (diarrhea and yeast infections) if you ate cultured yogurt at lunch.  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 Follow-up    Breathing is "pretty good".  She states she finished RT. She is using her albuterol inhaler 2 x per wk on average and rarely uses neb.   MMRC2 = can't walk a nl pace on a flat grade s sob but does fine slow and flat eg [employer] shopping on 2lpm rec Plan A = Automatic = Bevespi   Take 2 puffs first thing in am and then another 2 puffs about 12 hours later.  Work on inhaler technique:  Plan B = Backup Only use your albuterol as a rescue medication Plan C = Crisis - only use your albuterol nebulizer if you first try Plan B    08/17/2016  f/u ov/Neithan Day re:  GOLD III copd/ bevespi  Chief Complaint  Patient presents with  . Follow-up    Pt states that her breathing has been worse for the past wk. She has been belching often. She has occ cough and wheezing.  She has been using her proair 3 x per wk on average. She typically does not need neb, but has used x 2 this past wk.   wearing 02 24/7 now / poor hfa and insight into 02 and rescue rx  Cough is dry/ daytime, assoc with worth belching since starting PPI  rec Prednisone 10 mg take  4 each am x  2 days,  2 each am x 2 days,  1 each am x 2 days and stop  Try off protonix and change pepcid to  20 mg after bfast and after supper  02 2lpm 24/7  But ok to adjust up to keep saturations in low 90s if needed  Plan A = Automatic = Bevespi   Take 2 puffs first thing in am and then another 2 puffs about 12 hours later.  Work on inhaler technique:  Plan B = Backup Only use your albuterol as a rescue medication   Plan  C = Crisis - only use your albuterol nebulizer if you first try Plan B    11/17/2016  f/u ov/Orit Sanville re: copd GOLD III/ bevesopi  Chief Complaint  Patient presents with  . Follow-up    Breathing is overall doing well. She denies any new co's.    Using saba 3-4 x weekly at most  Doe = MMRC2 = can't walk a nl pace on a flat grade s sob but does fine slow and flat eg shopping at [employer] on 2lpm NP  Sometimes leaves 02 off at home   No obvious day to day or daytime variability or assoc excess/ purulent sputum or mucus plugs or hemoptysis or cp or chest tightness, subjective wheeze or overt sinus or hb symptoms. No unusual exp hx or h/o childhood pna/ asthma or knowledge of premature birth.  Sleeping ok flat without nocturnal  or early am exacerbation  of respiratory  c/o's or need for noct saba. Also denies any obvious fluctuation of symptoms with weather or environmental changes or other aggravating or alleviating factors except as outlined above   Current Allergies, Complete Past Medical History, Past Surgical History, Family History, and Social History were reviewed in [employer] record.  ROS  The following are not active complaints unless bolded Hoarseness, sore throat, dysphagia, dental problems, itching, sneezing,  nasal congestion or discharge of excess mucus or purulent secretions, ear ache,   fever, chills, sweats, unintended wt loss or wt gain, classically pleuritic or exertional cp,  orthopnea pnd or leg swelling, presyncope, palpitations, abdominal pain, anorexia, nausea, vomiting, diarrhea  or change in bowel habits or change in bladder habits, change in stools or change in urine, dysuria, hematuria,  rash, arthralgias, visual complaints, headache, numbness, weakness or ataxia or problems with walking or coordination,  change in mood/affect or memory.        Current Meds  Medication Sig  . albuterol (PROAIR HFA) 108 (90 Base) MCG/ACT inhaler Inhale 2 puffs into the  lungs every 4 (four) hours as needed. 2 puffs every 4 hours as needed only  if your can't catch your breath  . albuterol (PROVENTIL) (2.5 MG/3ML) 0.083% nebulizer solution Take 3 mLs (2.5 mg total) by nebulization every 4 (four) hours as needed. For shortness of breath  . amLODipine (NORVASC) 5 MG tablet Take 1 tablet (5 mg total) by mouth daily.  Marland Kitchen aspirin 81 MG tablet Take 81 mg by mouth daily.  Marland Kitchen BEVESPI AEROSPHERE 9-4.8 MCG/ACT AERO inhale 2 puffs twice a day  . diphenhydrAMINE (SOMINEX) 25 MG tablet Take 25 mg by mouth at bedtime as needed for sleep.  . famotidine (PEPCID) 20 MG tablet Take 1 tablet (20 mg total) by mouth 2 (two) times daily.  . hydrochlorothiazide (MICROZIDE) 12.5 MG capsule Take 12.5 mg by mouth daily.  Marland Kitchen ibuprofen (ADVIL,MOTRIN) 200 MG tablet Take 400 mg by mouth at bedtime.   Marland Kitchen  OXYGEN 2lpm with sleep and 4 lpm with exertion  . sertraline (ZOLOFT) 100 MG tablet Take 100 mg by mouth 2 (two) times daily.  . [DISCONTINUED] albuterol (PROAIR HFA) 108 (90 Base) MCG/ACT inhaler Inhale 2 puffs into the lungs every 4 (four) hours as needed. 2 puffs every 4 hours as needed only  if your can't catch your breath                Objective:   Physical Exam  amb bf nad   Slt cushingnoid appearance    10/28/2014        160 > 12/09/2014 161 > 03/14/2015 158 >    04/25/2015  161 >  06/20/2015  158 > 09/22/2015   159 > 11/19/2015 159 > 12/23/2015   159  > 02/03/2016    163  > 05/18/2016   161 > 11/17/2016   154     10/10/14 160 lb (72.576 kg)  09/16/14 156 lb (70.761 kg)  09/04/14 160 lb 13.6 oz (72.96 kg)    Vital signs reviewed - - Note on arrival 02 sats  94% on  2lpm continuous      HEENT: nl turbinates, and oropharynx. Nl external ear canals without cough reflex - upper and lower partials   NECK :  without JVD/Nodes/TM/ nl carotid upstrokes bilaterally   LUNGS: no acc muscle use,  Distant bs bilaterally  s audible wheeze   CV:  RRR  no s3 or murmur or increase in P2, no  edema   ABD:  soft and nontender with nl excursion in the supine position. No bruits or organomegaly, bowel sounds nl  MS:  warm without deformities, calf tenderness, cyanosis or clubbing  SKIN: warm and dry without lesions    NEURO:  alert, approp, no deficits         Assessment:

## 2016-11-19 NOTE — Assessment & Plan Note (Signed)
Started on 02 2lpm at d/c 09/04/14  - 10/28/2014   Walked RA x one lap @ 185 stopped due to  desat to 82%  at nl pace so rec 02 2lpm with walking and sleeping  - 12/09/2014 referred for POC > did not qualify - 08/17/2016 Patient Saturations on Room Air at Rest = 85%---increased to 93% on 2lpm o2  As of 11/17/2016  2lpm 24/7   But  4-5lpm pulsed walking any distance  Adequate control on present rx, reviewed in detail with pt > no change in rx needed

## 2016-11-19 NOTE — Assessment & Plan Note (Signed)
Quit smoking 08/2014 - PFT's  06/20/2015  FEV1 0.75 (44 % ) ratio 62  p no % improvement from saba p ? prior to study with DLCO  24 % corrects to 48 % for alv volume    - 12/09/2014 referred to rehab > started 12/2014  - 04/25/2015   try BEVESPI  - 06/20/2015  Changed by insurance> back on symb 160  - 09/22/2015 not happy with symbicort > changed back to besvespi 2bid  - 11/17/2016  After extensive coaching HFA effectiveness =    75% (Ti too short)  Despite suboptiomal hfa her symptoms are relatively well controlled with no recent flares  and Pt is therefore  Group B in terms of symptom/risk and laba/lama therefore appropriate rx at this point so will continue bevespi  Each maintenance medication was reviewed in detail including most importantly the difference between maintenance and as needed and under what circumstances the prns are to be used.  Please see AVS for specific  Instructions which are unique to this visit and I personally typed out  which were reviewed in detail in writing with the patient and a copy provided.

## 2016-11-25 ENCOUNTER — Other Ambulatory Visit: Payer: Self-pay | Admitting: Cardiology

## 2016-11-25 DIAGNOSIS — R079 Chest pain, unspecified: Secondary | ICD-10-CM

## 2016-12-20 ENCOUNTER — Ambulatory Visit (HOSPITAL_COMMUNITY): Payer: Medicare Other

## 2016-12-24 ENCOUNTER — Telehealth: Payer: Self-pay | Admitting: Internal Medicine

## 2016-12-24 MED ORDER — PREDNISONE 10 MG PO TABS: ORAL_TABLET | ORAL | 0 refills | Status: DC

## 2016-12-24 NOTE — Telephone Encounter (Signed)
Called and spoke to pt. Pt c/o sinus congestion with headache, dry cough, increase in SOB, PND, right eye watering; signs and symptoms presented last week. Pt states she is taking Claritin and Mucinex without relief. Pt is requesting recs from MW  Dr. Melvyn Novas please advise. Thanks.    Instructions 10.24.2018  Work on inhaler technique:  relax and gently blow all the way out then take a nice smooth deep breath back in, triggering the inhaler at same time you start breathing in.  Hold for up to 5 seconds if you can.  Rinse and gargle with water when done         Please schedule a follow up visit in 3 months but call sooner if needed

## 2016-12-24 NOTE — Telephone Encounter (Signed)
Prednisone 10 mg take  4 each am x 2 days,   2 each am x 2 days,  1 each am x 2 days and stop   Be sure she's using mucinex dm up to 1200 mg every 12 hours and set up for f/u with me in 2 weeks with all meds in hand to regroup

## 2016-12-24 NOTE — Telephone Encounter (Signed)
Pt is aware of recommendations and voiced her understanding. Rx has been sent to preferred pharmacy. Apt has been scheduled with MW on 01/07/17 @ 3:45. Nothing further needed.

## 2016-12-31 ENCOUNTER — Ambulatory Visit (HOSPITAL_COMMUNITY): Payer: Medicare Other

## 2016-12-31 ENCOUNTER — Encounter (HOSPITAL_COMMUNITY): Payer: Self-pay

## 2017-01-07 ENCOUNTER — Ambulatory Visit: Payer: Medicare Other | Admitting: Internal Medicine

## 2017-01-07 ENCOUNTER — Encounter: Payer: Self-pay | Admitting: Internal Medicine

## 2017-01-07 ENCOUNTER — Ambulatory Visit (HOSPITAL_COMMUNITY): Payer: Medicare Other

## 2017-01-07 VITALS — BP 150/80 | HR 110 | Ht 62.5 in | Wt 156.8 lb

## 2017-01-07 DIAGNOSIS — J9611 Chronic respiratory failure with hypoxia: Secondary | ICD-10-CM

## 2017-01-07 DIAGNOSIS — J449 Chronic obstructive pulmonary disease, unspecified: Secondary | ICD-10-CM

## 2017-01-07 NOTE — Progress Notes (Signed)
Subjective:     Patient ID: Suzanne BETTES, female   DOB: [birth year]     MRN: [id number]    Brief patient profile:  56 yobf quit smoking on admit 09/02/14  and proved to have GOLD III copd 11/2014       09/22/2015  f/u ov/Taegen Delker re: copd III/ symb 1602bid on 2lpm at rest/ 4lpm ex  Chief Complaint  Patient presents with  . Follow-up    [place] had hemoptysis and increased SOB. She took round pred and augmentin and symptoms are some better. She has noticed minimal wheezing at night. She has used neb 3 x over the past 2 wks.   liked bevespi better but wasn't covered then and not doing as well on the symbicort though hfa not ideal  - see a/p rec Plan A = Automatic =stop symbicort and restart Bevespi Take 2 puffs first thing in am and then another 2 puffs about 12 hours later.  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 Follow-up    Breathing is "pretty good".  She states she finished RT. She is using her albuterol inhaler 2 x per wk on average and rarely uses neb.   MMRC2 = can't walk a nl pace on a flat grade s sob but does fine slow and flat eg [employer] shopping on 2lpm rec Plan A = Automatic = Bevespi   Take 2 puffs first thing in am and then another 2 puffs about 12 hours later.  Work on inhaler technique:  Plan B = Backup Only use your  albuterol as a rescue medication Plan C = Crisis - only use your albuterol nebulizer if you first try Plan B    08/17/2016  f/u ov/Kaniel Kiang re:  GOLD III copd/ bevespi  Chief Complaint  Patient presents with  . Follow-up    Pt states that her breathing has been worse for the past wk. She has been belching often. She has occ cough and wheezing.  She has been using her proair 3 x per wk on average. She typically does not need neb, but has used x 2 this past wk.   wearing 02 24/7 now / poor hfa and insight into 02 and rescue rx  Cough is dry/ daytime, assoc with worth belching since starting PPI  rec Prednisone 10 mg take  4 each am x  2 days,  2 each am x 2 days,  1 each am x 2 days and stop  Try off protonix and change pepcid to  20 mg after bfast and after supper  02 2lpm 24/7  But ok to adjust up to keep saturations in low 90s if needed  Plan A = Automatic =  Bevespi   Take 2 puffs first thing in am and then another 2 puffs about 12 hours later.  Work on inhaler technique:  Plan B = Backup Only use your albuterol as a rescue medication   Plan C = Crisis - only use your albuterol nebulizer if you first try Plan B    11/17/2016  f/u ov/Rhythm Gubbels re: copd GOLD III/ bevesopi  Chief Complaint  Patient presents with  . Follow-up    Breathing is overall doing well. She denies any new co's.    Using saba 3-4 x weekly at most  Doe = MMRC2 = can't walk a nl pace on a flat grade s sob but does fine slow and flat eg shopping at [employer] on 2lpm NP  Sometimes leaves 02 off at home rec Work on inhaler technique   PC  12/24/16  called in Prednisone 10 mg take  4 each am x 2 days,   2 each am x 2 days,  1 each am x 2 days and stop    01/07/2017  f/u ov/Yarixa Lightcap re:  Copd III f/u s/p pred rx  Chief Complaint  Patient presents with  . Follow-up  . Shortness of Breath   p completed pred for flare of nasal congestion/ cough > back to baseline with  doe on 2lpm with acitity/ very slow pacing flat surfaces does  ok = MMRC2    No obvious day to day or daytime variability or assoc excess/ purulent sputum or mucus plugs or hemoptysis or cp or chest tightness, subjective wheeze or overt sinus or hb symptoms. No unusual exposure hx or h/o childhood pna/ asthma or knowledge of premature birth.  Sleeping ok flat without nocturnal  or early am exacerbation  of respiratory  c/o's or need for noct saba. Also denies any obvious fluctuation of symptoms with weather or environmental changes or other aggravating or alleviating factors except as outlined above   Current Allergies, Complete Past Medical History, Past Surgical History, Family History, and Social History were reviewed in [employer] record.  ROS  The following are not active complaints unless bolded Hoarseness, sore throat, dysphagia, dental problems, itching, sneezing,  nasal congestion or discharge of excess mucus or purulent secretions, ear ache,   fever, chills, sweats, unintended wt loss or wt gain, classically pleuritic or exertional cp,  orthopnea pnd or leg swelling, presyncope, palpitations, abdominal pain, anorexia, nausea, vomiting, diarrhea  or change in bowel habits or change in bladder habits, change in stools or change in urine, dysuria, hematuria,  rash, arthralgias, visual complaints, headache, numbness, weakness or ataxia or problems with walking or coordination,  change in mood/affect or memory.        Current Meds  Medication Sig  . albuterol (PROAIR HFA) 108 (90 Base) MCG/ACT inhaler Inhale 2 puffs into the lungs every 4 (four) hours as needed. 2 puffs every 4 hours as needed only  if your can't catch your breath  . albuterol (PROVENTIL) (2.5 MG/3ML) 0.083% nebulizer solution Take 3 mLs (2.5 mg total) by nebulization every 4 (four) hours as needed. For shortness of breath  . amLODipine (NORVASC) 5 MG tablet Take 1 tablet (5 mg total) by mouth daily.  Marland Kitchen aspirin 81 MG tablet Take 81 mg by mouth daily.  Marland Kitchen BEVESPI  AEROSPHERE 9-4.8 MCG/ACT AERO inhale 2 puffs twice a day  . diphenhydrAMINE (SOMINEX) 25 MG tablet Take 25 mg by mouth at bedtime as needed for sleep.  . famotidine (PEPCID) 20 MG  tablet Take 1 tablet (20 mg total) by mouth 2 (two) times daily.  . hydrochlorothiazide (MICROZIDE) 12.5 MG capsule Take 12.5 mg by mouth daily.  Marland Kitchen ibuprofen (ADVIL,MOTRIN) 200 MG tablet Take 400 mg by mouth at bedtime.   . OXYGEN 2lpm with sleep and 4 lpm with exertion  . sertraline (ZOLOFT) 100 MG tablet Take 100 mg by mouth 2 (two) times daily.                    Objective:   Physical Exam  amb obese bf nad   10/28/2014        160 > 12/09/2014 161 > 03/14/2015 158 >    04/25/2015  161 >  06/20/2015  158 > 09/22/2015   159 > 11/19/2015 159 > 12/23/2015   159  > 02/03/2016    163  > 05/18/2016   161 > 11/17/2016   154 > 01/07/2017  157     10/10/14 160 lb (72.576 kg)  09/16/14 156 lb (70.761 kg)  09/04/14 160 lb 13.6 oz (72.96 kg)    Vital signs reviewed - Note on arrival 02 sats  95% on 2lpm continous       HEENT: nl dentition, turbinates bilaterally, and oropharynx. Nl external ear canals without cough reflex   NECK :  without JVD/Nodes/TM/ nl carotid upstrokes bilaterally   LUNGS: no acc muscle use,  Nl contour chest with   distant bs bilaterally and no wheeze   CV:  RRR  no s3 or murmur or increase in P2, and no edema   ABD:  soft and nontender with nl inspiratory excursion in the supine position. No bruits or organomegaly appreciated, bowel sounds nl  MS:  Nl gait/ ext warm without deformities, calf tenderness, cyanosis or clubbing No obvious joint restrictions   SKIN: warm and dry without lesions    NEURO:  alert, approp, nl sensorium with  no motor or cerebellar deficits apparent.            Assessment:

## 2017-01-07 NOTE — Patient Instructions (Signed)
Work on inhaler technique:  relax and gently blow all the way out then take a nice smooth deep breath back in, triggering the inhaler at same time you start breathing in.  Hold for up to 5 seconds if you can. Blow out thru nose. Rinse and gargle with water when done  Keep your previous appt

## 2017-01-08 NOTE — Assessment & Plan Note (Signed)
Started on 02 2lpm at d/c 09/04/14  - 10/28/2014   Walked RA x one lap @ 185 stopped due to  desat to 82%  at nl pace so rec 02 2lpm with walking and sleeping  - 12/09/2014 referred for POC > did not qualify - 08/17/2016 Patient Saturations on Room Air at Rest = 85%---increased to 93% on 2lpm o2  As of 01/07/2017  2lpm 24/7   But  4-5lpm pulsed walking any distance

## 2017-01-08 NOTE — Assessment & Plan Note (Signed)
Quit smoking 08/2014 - PFT's  06/20/2015  FEV1 0.75 (44 % ) ratio 62  p no % improvement from saba p ? prior to study with DLCO  24 % corrects to 48 % for alv volume    - 12/09/2014 referred to rehab > started 12/2014  - 04/25/2015   try BEVESPI  - 06/20/2015  Changed by insurance> back on symb 160  - 09/22/2015 not happy with symbicort > changed back to besvespi 2bid  - 01/07/2017  After extensive coaching HFA effectiveness =    75% (Ti too short)  Continues to struggle with hfa and yet got thru another aecopd ok s/p zpak /pred  In longterm hfa may need to be changes to [facility], dpi or neb lama/laba, however, as she has severe copd and limited ventilatory reserve.  Each maintenance medication was reviewed in detail including most importantly the difference between maintenance and as needed and under what circumstances the prns are to be used.  Please see AVS for specific  Instructions which are unique to this visit and I personally typed out  which were reviewed in detail in writing with the patient and a copy provided.

## 2017-01-10 ENCOUNTER — Ambulatory Visit (HOSPITAL_COMMUNITY): Payer: Medicare Other

## 2017-01-10 IMAGING — DX DG CHEST 2V
2 series · 2 of 2 positions shown · non-contrast
Comparison: Chest radiograph 09/02/2014

CLINICAL DATA: Shortness of breath and cough

EXAM:
CHEST  2 VIEW

[chest pa]
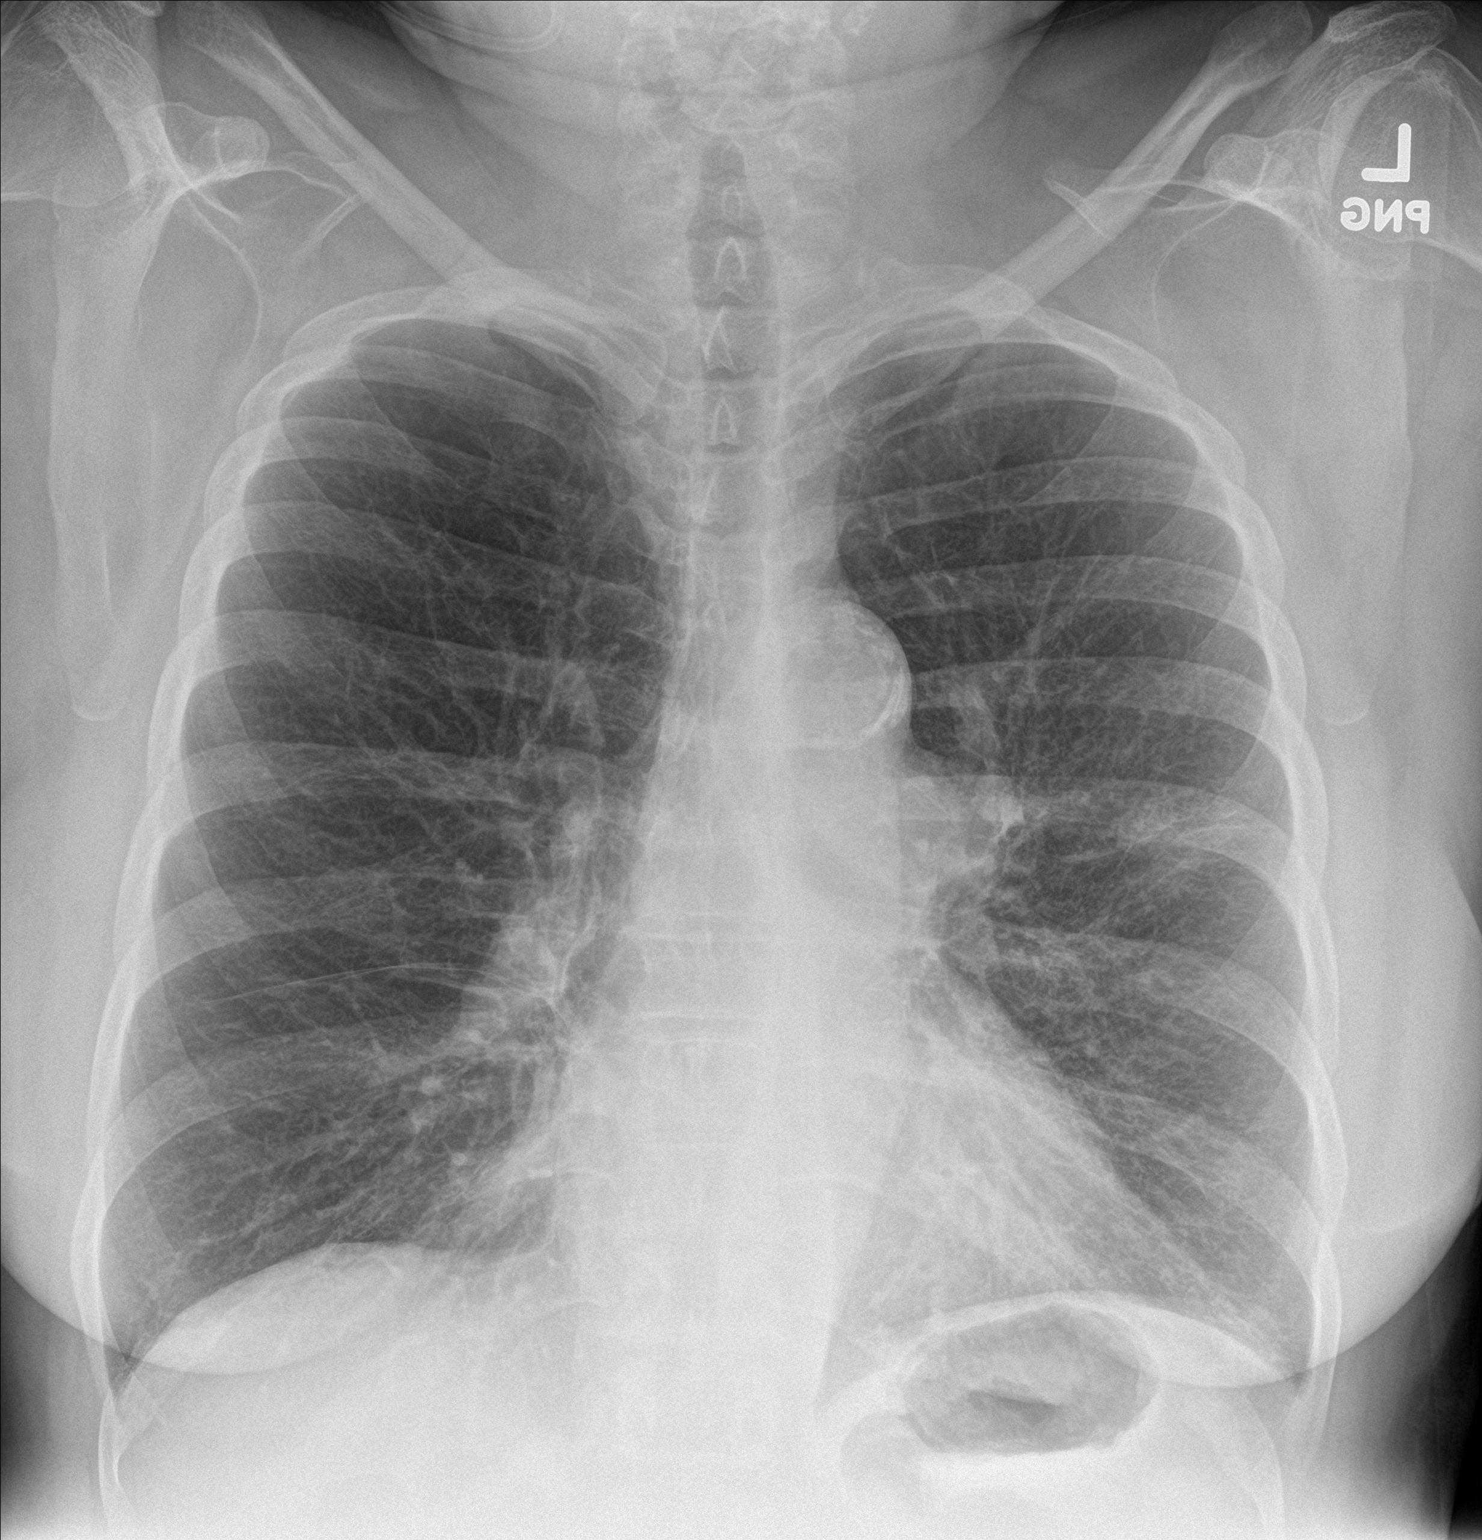

[chest lat]
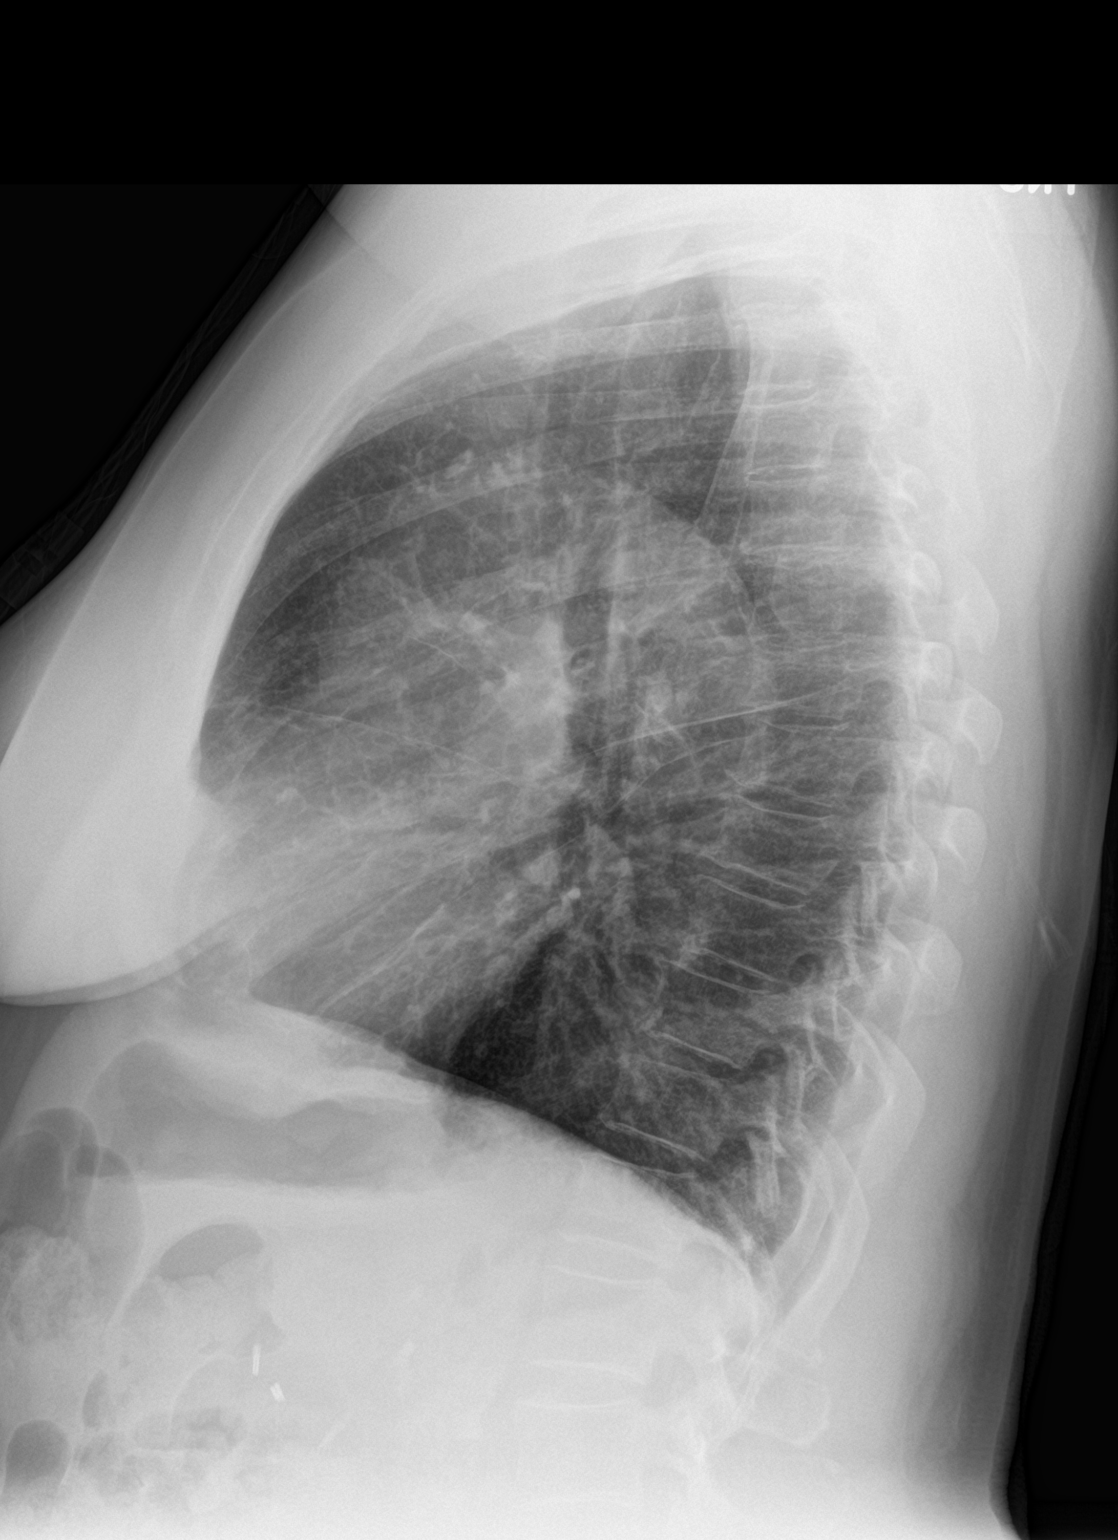

[2 of 2 positions shown; findings below may reference images not displayed]

FINDINGS: Lungs are hyperinflated. There is atherosclerotic calcification in
the aortic arch. There is a small area of increased linear opacity
in the left midlung. There is no other focal airspace consolidation
or pulmonary edema. No pneumothorax or pleural effusion.
IMPRESSION: COPD. Mildly increased opacity in the left mid lung, which could
represent developing infection in the appropriate clinical context.
No large area of consolidation.

## 2017-01-28 ENCOUNTER — Encounter (HOSPITAL_COMMUNITY)
Admission: RE | Admit: 2017-01-28 | Discharge: 2017-01-28 | Disposition: A | Discharge status: Home / Self Care | Payer: Medicare Other | Source: Ambulatory Visit | Attending: Cardiology | Admitting: Cardiology

## 2017-01-28 DIAGNOSIS — R079 Chest pain, unspecified: Secondary | ICD-10-CM | POA: Diagnosis present

## 2017-01-28 MED ORDER — TECHNETIUM TC 99M TETROFOSMIN IV KIT
30.0000 | PACK | Freq: Once | INTRAVENOUS | Status: AC | PRN
  Administered 2017-01-28: 30 via INTRAVENOUS

## 2017-01-28 MED ORDER — REGADENOSON 0.4 MG/5ML IV SOLN
0.4000 mg | Freq: Once | INTRAVENOUS | Status: AC
  Administered 2017-01-28: 0.4 mg via INTRAVENOUS

## 2017-01-28 MED ORDER — REGADENOSON 0.4 MG/5ML IV SOLN
INTRAVENOUS | Status: AC
  Filled 2017-01-28: qty 5

## 2017-01-28 MED ORDER — AMINOPHYLLINE 25 MG/ML IV SOLN
INTRAVENOUS | Status: AC
  Filled 2017-01-28: qty 10

## 2017-01-28 MED ORDER — TECHNETIUM TC 99M TETROFOSMIN IV KIT
10.0000 | PACK | Freq: Once | INTRAVENOUS | Status: AC | PRN
  Administered 2017-01-28: 10 via INTRAVENOUS

## 2017-02-17 ENCOUNTER — Observation Stay (HOSPITAL_COMMUNITY)
Admission: RE | Admit: 2017-02-17 | Discharge: 2017-02-18 | Disposition: A | Discharge status: Home / Self Care | Payer: Medicare Other | Source: Ambulatory Visit | Attending: Cardiology | Admitting: Cardiology

## 2017-02-17 ENCOUNTER — Encounter (HOSPITAL_COMMUNITY): Payer: Self-pay

## 2017-02-17 ENCOUNTER — Observation Stay (HOSPITAL_COMMUNITY): Payer: Medicare Other

## 2017-02-17 ENCOUNTER — Other Ambulatory Visit: Payer: Self-pay

## 2017-02-17 ENCOUNTER — Encounter (HOSPITAL_COMMUNITY)
Admission: RE | Disposition: A | Discharge status: Home / Self Care | Payer: Self-pay | Source: Ambulatory Visit | Attending: Cardiology

## 2017-02-17 DIAGNOSIS — M1990 Unspecified osteoarthritis, unspecified site: Secondary | ICD-10-CM | POA: Insufficient documentation

## 2017-02-17 DIAGNOSIS — F329 Major depressive disorder, single episode, unspecified: Secondary | ICD-10-CM | POA: Insufficient documentation

## 2017-02-17 DIAGNOSIS — I491 Atrial premature depolarization: Secondary | ICD-10-CM | POA: Insufficient documentation

## 2017-02-17 DIAGNOSIS — C3412 Malignant neoplasm of upper lobe, left bronchus or lung: Secondary | ICD-10-CM | POA: Diagnosis not present

## 2017-02-17 DIAGNOSIS — R9439 Abnormal result of other cardiovascular function study: Secondary | ICD-10-CM | POA: Diagnosis not present

## 2017-02-17 DIAGNOSIS — J441 Chronic obstructive pulmonary disease with (acute) exacerbation: Secondary | ICD-10-CM | POA: Diagnosis present

## 2017-02-17 DIAGNOSIS — I110 Hypertensive heart disease with heart failure: Secondary | ICD-10-CM | POA: Insufficient documentation

## 2017-02-17 DIAGNOSIS — Z923 Personal history of irradiation: Secondary | ICD-10-CM | POA: Diagnosis not present

## 2017-02-17 DIAGNOSIS — Z7951 Long term (current) use of inhaled steroids: Secondary | ICD-10-CM | POA: Diagnosis not present

## 2017-02-17 DIAGNOSIS — Z9049 Acquired absence of other specified parts of digestive tract: Secondary | ICD-10-CM | POA: Insufficient documentation

## 2017-02-17 DIAGNOSIS — R0600 Dyspnea, unspecified: Secondary | ICD-10-CM | POA: Diagnosis not present

## 2017-02-17 DIAGNOSIS — Z9981 Dependence on supplemental oxygen: Secondary | ICD-10-CM | POA: Insufficient documentation

## 2017-02-17 DIAGNOSIS — F419 Anxiety disorder, unspecified: Secondary | ICD-10-CM | POA: Diagnosis not present

## 2017-02-17 DIAGNOSIS — E785 Hyperlipidemia, unspecified: Secondary | ICD-10-CM | POA: Diagnosis not present

## 2017-02-17 DIAGNOSIS — I25118 Atherosclerotic heart disease of native coronary artery with other forms of angina pectoris: Secondary | ICD-10-CM | POA: Diagnosis not present

## 2017-02-17 DIAGNOSIS — Z7982 Long term (current) use of aspirin: Secondary | ICD-10-CM | POA: Insufficient documentation

## 2017-02-17 DIAGNOSIS — G4733 Obstructive sleep apnea (adult) (pediatric): Secondary | ICD-10-CM | POA: Insufficient documentation

## 2017-02-17 DIAGNOSIS — Z79899 Other long term (current) drug therapy: Secondary | ICD-10-CM | POA: Diagnosis not present

## 2017-02-17 DIAGNOSIS — R0602 Shortness of breath: Secondary | ICD-10-CM

## 2017-02-17 DIAGNOSIS — D638 Anemia in other chronic diseases classified elsewhere: Secondary | ICD-10-CM | POA: Insufficient documentation

## 2017-02-17 DIAGNOSIS — I10 Essential (primary) hypertension: Secondary | ICD-10-CM | POA: Diagnosis present

## 2017-02-17 DIAGNOSIS — I70219 Atherosclerosis of native arteries of extremities with intermittent claudication, unspecified extremity: Secondary | ICD-10-CM | POA: Diagnosis present

## 2017-02-17 DIAGNOSIS — K219 Gastro-esophageal reflux disease without esophagitis: Secondary | ICD-10-CM | POA: Insufficient documentation

## 2017-02-17 DIAGNOSIS — J849 Interstitial pulmonary disease, unspecified: Secondary | ICD-10-CM | POA: Insufficient documentation

## 2017-02-17 HISTORY — PX: LEFT HEART CATH AND CORONARY ANGIOGRAPHY: CATH118249

## 2017-02-17 LAB — CBC
HCT: 28.7 % — ABNORMAL LOW (ref 36.0–46.0)
HEMATOCRIT: 28.4 % — AB (ref 36.0–46.0)
HEMOGLOBIN: 8.5 g/dL — AB (ref 12.0–15.0)
Hemoglobin: 8.6 g/dL — ABNORMAL LOW (ref 12.0–15.0)
MCH: 26 pg (ref 26.0–34.0)
MCH: 26.1 pg (ref 26.0–34.0)
MCHC: 29.9 g/dL — ABNORMAL LOW (ref 30.0–36.0)
MCHC: 30 g/dL (ref 30.0–36.0)
MCV: 86.9 fL (ref 78.0–100.0)
MCV: 87 fL (ref 78.0–100.0)
PLATELETS: 220 10*3/uL (ref 150–400)
Platelets: 229 10*3/uL (ref 150–400)
RBC: 3.27 MIL/uL — ABNORMAL LOW (ref 3.87–5.11)
RBC: 3.3 MIL/uL — ABNORMAL LOW (ref 3.87–5.11)
RDW: 14.3 % (ref 11.5–15.5)
RDW: 14.6 % (ref 11.5–15.5)
WBC: 8.3 10*3/uL (ref 4.0–10.5)
WBC: 8.1 10*3/uL (ref 4.0–10.5)

## 2017-02-17 LAB — CREATININE, SERUM
CREATININE: 1.05 mg/dL — AB (ref 0.44–1.00)
GFR calc Af Amer: >60 mL/min (ref >60)
GFR calc non Af Amer: 52 mL/min — ABNORMAL LOW (ref >60)

## 2017-02-17 LAB — BRAIN NATRIURETIC PEPTIDE: B Natriuretic Peptide: 225.5 pg/mL — ABNORMAL HIGH (ref 0.0–100.0)

## 2017-02-17 LAB — IRON AND TIBC
Iron: 105 ug/dL (ref 28–170)
SATURATION RATIOS: 29 % (ref 10.4–31.8)
TIBC: 368 ug/dL (ref 250–450)
UIBC: 263 ug/dL

## 2017-02-17 LAB — FERRITIN: FERRITIN: 208 ng/mL (ref 11–307)

## 2017-02-17 LAB — TROPONIN I: Troponin I: <0.03 ng/mL (ref <0.03)

## 2017-02-17 SURGERY — LEFT HEART CATH AND CORONARY ANGIOGRAPHY
Anesthesia: LOCAL

## 2017-02-17 MED ORDER — SODIUM CHLORIDE 0.9% FLUSH
3.0000 mL | Freq: Two times a day (BID) | INTRAVENOUS | Status: DC
  Administered 2017-02-17: 3 mL via INTRAVENOUS

## 2017-02-17 MED ORDER — HEPARIN SODIUM (PORCINE) 5000 UNIT/ML IJ SOLN
5000.0000 [IU] | Freq: Three times a day (TID) | INTRAMUSCULAR | Status: DC
  Administered 2017-02-17 – 2017-02-18 (×3): 5000 [IU] via SUBCUTANEOUS
  Filled 2017-02-17 (×5): qty 1

## 2017-02-17 MED ORDER — SODIUM CHLORIDE 0.9 % IV SOLN: INTRAVENOUS | Status: AC

## 2017-02-17 MED ORDER — SODIUM CHLORIDE 0.9 % WEIGHT BASED INFUSION
3.0000 mL/kg/h | INTRAVENOUS | Status: DC
  Administered 2017-02-17: 3 mL/kg/h via INTRAVENOUS

## 2017-02-17 MED ORDER — SODIUM CHLORIDE 0.9% FLUSH: 3.0000 mL | INTRAVENOUS | Status: DC | PRN

## 2017-02-17 MED ORDER — ASPIRIN 81 MG PO CHEW
81.0000 mg | CHEWABLE_TABLET | ORAL | Status: AC
  Administered 2017-02-17: 81 mg via ORAL

## 2017-02-17 MED ORDER — ALBUTEROL SULFATE (2.5 MG/3ML) 0.083% IN NEBU
INHALATION_SOLUTION | RESPIRATORY_TRACT | Status: AC
  Filled 2017-02-17: qty 3

## 2017-02-17 MED ORDER — HEPARIN (PORCINE) IN NACL 2-0.9 UNIT/ML-% IJ SOLN
INTRAMUSCULAR | Status: AC | PRN
  Administered 2017-02-17: 1000 mL

## 2017-02-17 MED ORDER — SODIUM CHLORIDE 0.9 % IV SOLN: 250.0000 mL | INTRAVENOUS | Status: DC | PRN

## 2017-02-17 MED ORDER — SODIUM CHLORIDE 0.9% FLUSH
3.0000 mL | INTRAVENOUS | Status: DC | PRN
Start: 2017-02-17 — End: 2017-02-17

## 2017-02-17 MED ORDER — ACETAMINOPHEN 325 MG PO TABS: 650.0000 mg | ORAL_TABLET | ORAL | Status: DC | PRN

## 2017-02-17 MED ORDER — METHYLPREDNISOLONE SODIUM SUCC 125 MG IJ SOLR
INTRAMUSCULAR | Status: AC
  Filled 2017-02-17: qty 2

## 2017-02-17 MED ORDER — IOPAMIDOL (ISOVUE-370) INJECTION 76%
INTRAVENOUS | Status: DC | PRN
Start: 2017-02-17 — End: 2017-02-17
  Administered 2017-02-17: 70 mL via INTRA_ARTERIAL

## 2017-02-17 MED ORDER — MIDAZOLAM HCL 2 MG/2ML IJ SOLN
INTRAMUSCULAR | Status: DC | PRN
  Administered 2017-02-17 (×2): 1 mg via INTRAVENOUS

## 2017-02-17 MED ORDER — IOPAMIDOL (ISOVUE-370) INJECTION 76%
INTRAVENOUS | Status: AC
  Filled 2017-02-17: qty 50

## 2017-02-17 MED ORDER — ASPIRIN 81 MG PO CHEW
CHEWABLE_TABLET | ORAL | Status: AC
  Administered 2017-02-17: 81 mg via ORAL
  Filled 2017-02-17: qty 1

## 2017-02-17 MED ORDER — ALBUTEROL SULFATE (2.5 MG/3ML) 0.083% IN NEBU
INHALATION_SOLUTION | RESPIRATORY_TRACT | Status: AC
  Administered 2017-02-17: 2.5 mg via RESPIRATORY_TRACT
  Filled 2017-02-17: qty 3

## 2017-02-17 MED ORDER — SODIUM CHLORIDE 0.9 % WEIGHT BASED INFUSION: 1.0000 mL/kg/h | INTRAVENOUS | Status: DC

## 2017-02-17 MED ORDER — MIDAZOLAM HCL 2 MG/2ML IJ SOLN
INTRAMUSCULAR | Status: AC
Start: 2017-02-17 — End: ?
  Filled 2017-02-17: qty 2

## 2017-02-17 MED ORDER — ALBUTEROL SULFATE (2.5 MG/3ML) 0.083% IN NEBU
2.5000 mg | INHALATION_SOLUTION | RESPIRATORY_TRACT | Status: AC
  Administered 2017-02-17: 2.5 mg via RESPIRATORY_TRACT

## 2017-02-17 MED ORDER — LIDOCAINE HCL (PF) 1 % IJ SOLN
INTRAMUSCULAR | Status: DC | PRN
  Administered 2017-02-17: 20 mL

## 2017-02-17 MED ORDER — METHYLPREDNISOLONE SODIUM SUCC 125 MG IJ SOLR
INTRAMUSCULAR | Status: DC | PRN
  Administered 2017-02-17: 125 mg via INTRAVENOUS

## 2017-02-17 MED ORDER — SODIUM CHLORIDE 0.9% FLUSH: 3.0000 mL | Freq: Two times a day (BID) | INTRAVENOUS | Status: DC

## 2017-02-17 MED ORDER — ALBUTEROL SULFATE (2.5 MG/3ML) 0.083% IN NEBU
2.5000 mg | INHALATION_SOLUTION | Freq: Four times a day (QID) | RESPIRATORY_TRACT | Status: DC
  Administered 2017-02-17 – 2017-02-18 (×3): 2.5 mg via RESPIRATORY_TRACT
  Filled 2017-02-17 (×4): qty 3

## 2017-02-17 MED ORDER — SODIUM CHLORIDE 0.9 % IV SOLN
250.0000 mL | INTRAVENOUS | Status: DC | PRN
Start: 2017-02-17 — End: 2017-02-17

## 2017-02-17 MED ORDER — HEPARIN (PORCINE) IN NACL 2-0.9 UNIT/ML-% IJ SOLN
INTRAMUSCULAR | Status: AC
  Filled 2017-02-17: qty 1000

## 2017-02-17 SURGICAL SUPPLY — 9 items
CATH INFINITI 5 FR 3DRC (CATHETERS) ×1 IMPLANT
CATH INFINITI 5FR MULTPACK ANG (CATHETERS) ×1 IMPLANT
KIT HEART LEFT (KITS) ×2 IMPLANT
PACK CARDIAC CATHETERIZATION (CUSTOM PROCEDURE TRAY) ×2 IMPLANT
SHEATH PINNACLE 5F 10CM (SHEATH) ×1 IMPLANT
SYR MEDRAD MARK V 150ML (SYRINGE) ×2 IMPLANT
TRANSDUCER W/STOPCOCK (MISCELLANEOUS) ×2 IMPLANT
WIRE EMERALD 3MM-J .035X150CM (WIRE) ×1 IMPLANT
WIRE EMERALD 3MM-J .035X260CM (WIRE) ×1 IMPLANT

## 2017-02-17 NOTE — H&P (Signed)
Dictated H&P in the chart needs to be scanned

## 2017-02-17 NOTE — Progress Notes (Signed)
Subjective:  Patient denies any chest pain.  States breathing is improved after breathing treatment  O2 sats 95-97% on 2 L Objective:  Vital Signs in the last 24 hours: Temp:  [97.7 F (36.5 C)-98.2 F (36.8 C)] 98.2 F (36.8 C) (01/24 1452) Pulse Rate:  [64-105] 79 (01/24 1500) Resp:  [18-35] 24 (01/24 1500) BP: (108-178)/(46-91) 168/69 (01/24 1452) SpO2:  [88 %-100 %] 100 % (01/24 1500) Weight:  [70.3 kg (155 lb)] 70.3 kg (155 lb) (01/24 0547)  Intake/Output from previous day: No intake/output data recorded. Intake/Output from this shift: Total I/O In: -  Out: 500 [Urine:500]  Physical Exam: Neck: no adenopathy, no carotid bruit, no JVD and supple, symmetrical, trachea midline Lungs: decreased breath sounds at bases Heart: regular rate and rhythm, S1, S2 normal and soft systolic murmur noted Abdomen: soft, non-tender; bowel sounds normal; no masses,  no organomegaly Extremities: extremities normal, atraumatic, no cyanosis or edema and right groin stable  Lab Results: Recent Labs    02/17/17 0615 02/17/17 1202  WBC 8.3 8.1  HGB 8.5* 8.6*  PLT 229 220   Recent Labs    02/17/17 1202  CREATININE 1.05*   Recent Labs    02/17/17 1202  TROPONINI <0.03   Hepatic Function Panel No results for input(s): PROT, ALBUMIN, AST, ALT, ALKPHOS, BILITOT, BILIDIR, IBILI in the last 72 hours. No results for input(s): CHOL in the last 72 hours. No results for input(s): PROTIME in the last 72 hours.  Imaging: Imaging results have been reviewed and Portable Chest 1 View  Result Date: 02/17/2017 CLINICAL DATA:  Shortness of breath. EXAM: PORTABLE CHEST 1 VIEW COMPARISON:  CT 08/24/2016.  Chest x-ray 08/17/2016, 12/23/2015. FINDINGS: Mediastinum and hilar structures normal. Stable cardiomegaly. Postsurgical changes left mid lung. Previously identified left mid lung lesion is no longer identified. Chronic bilateral interstitial prominence noted consistent chronic interstitial lung  disease. No change from prior exams. IMPRESSION: 1. Postsurgical changes left lung. Previously identified density in the left mid lung is no longer identified. Chronic interstitial changes consistent chronic interstitial lung disease. 2.  Stable cardiomegaly.  No pulmonary venous congestion. Electronically Signed   By: Marcello Moores  Register   On: 02/17/2017 11:58    Cardiac Studies:  Assessment/Plan:  Acute exacerbation of COPD Stable angina, status post left cardiac catheterization. COPD Chronic depression. Hypertension. Hyperlipidemia. Obstructive sleep apnea. 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 Possible discharge tomorrow if stable   LOS: 0 days    [name] 02/17/2017, 6:22 PM

## 2017-02-17 NOTE — Progress Notes (Signed)
Rapid response called to short stay for patient in resp distress. Was told to bring a BiPAP. When I arrived, RN was starting neb, pateint began to start looking better soon after starting. BBS still very wheezy and diminished., patient to be admitted. Currently on [facility], which is what she wears at home.

## 2017-02-17 NOTE — Interval H&P Note (Signed)
Cath Lab Visit (complete for each Cath Lab visit)  Clinical Evaluation Leading to the Procedure:   ACS: No.  Non-ACS:    Anginal Classification: CCS III  Anti-ischemic medical therapy: Maximal Therapy (2 or more classes of medications)  Non-Invasive Test Results: Intermediate-risk stress test findings: cardiac mortality 1-3%/year  Prior CABG: No previous CABG      History and Physical Interval Note:  02/17/2017 7:40 AM  Suzanne Dickson  has presented today for surgery, with the diagnosis of abnormal stress test - cp  The various methods of treatment have been discussed with the patient and family. After consideration of risks, benefits and other options for treatment, the patient has consented to  Procedure(s): LEFT HEART CATH AND CORONARY ANGIOGRAPHY (N/A) as a surgical intervention .  The patient's history has been reviewed, patient examined, no change in status, stable for surgery.  I have reviewed the patient's chart and labs.  Questions were answered to the patient's satisfaction.     [name]

## 2017-02-17 NOTE — Progress Notes (Addendum)
Pt having difficulty breathing, rapid response paged and Dr Terrence Dupont paged, pt sitting straight up groin currently stable. Pt had used her home proair inhaler 2 hours ago, pt had been breathing and saturating well on 2 L [place] as does at home, pt suddenly had worsening breathing difficulty, staff came and alerted me, pt was sitting straight up with labored breathing. Groin site stable, rapid response arrived along with resp assistance, nebulizer treatment was done, VSS, groin site stable.

## 2017-02-17 NOTE — Progress Notes (Addendum)
Site area: Right groin a 5 french arterial sheath was removed  Site Prior to Removal:  Level 0  Pressure Applied For 25 MINUTES    Bedrest Beginning at 0920am  Manual:   Yes.    Patient Status During Pull:  stable  Post Pull Groin Site:  Level 0  Post Pull Instructions Given:  Yes.    Post Pull Pulses Present:  Yes.    Dressing Applied:  Yes.    Comments:  VS remain stable during sheath pull

## 2017-02-17 NOTE — Significant Event (Signed)
Rapid Response Event Note  Overview: Time Called: 1124 Arrival Time: 1127 Event Type: Respiratory  Initial Focused Assessment: Diagnostic cath this am Was tolerating lying flat.  Sheath pulled recently. Then suddenly she became very short of breath. Patient with acute respiratory distress.  Expiratory Wheezes. Minimal air movement. 141/55  SR 87  RR 28  O2 sats 100% on 4L [place] Hx of COPD, wears O2 at home  Interventions: 5mg  Albuterol treatment Dr Doylene Canard at bedside to assess patient  PCXR done 12 lead EKG done  Much improved after albuterol treatment  Plan admit to telemetry    Plan of Care (if not transferred): RN to call if assistance needed  Event Summary:  Name of Consulting Physician Notified: Doylene Canard at [address]

## 2017-02-17 NOTE — Progress Notes (Signed)
Ref: Patient, No Pcp Per   Subjective:  Shortness of breath improving with albuterol breathing treatment. Patient had cardiac cath done this AM by Dr. [name]. Patient tells me she has been sick for 2 weeks and had increasing shortness of breath. She also tells me that she had these episodes in past. Her lung doctor is Dr. Melvyn Novas. She is on 2 L of oxygen at home. She has h/o adenocarcinoma of left lung, stage 1 treated with radiation therapy 05/04/16 to 05/12/2016. EKG shows lateral wall ischemia. Chest x-ray is positive for chronic interstitial lung disease and post surgical changes of left lung.  Objective:  Vital Signs in the last 24 hours: Temp:  [97.7 F (36.5 C)-97.8 F (36.6 C)] 97.8 F (36.6 C) (01/24 0939) Pulse Rate:  [64-105] 87 (01/24 1140) Resp:  [19-29] 19 (01/24 0915) BP: (108-178)/(55-91) 141/55 (01/24 1140) SpO2:  [88 %-100 %] 100 % (01/24 1140) Weight:  [70.3 kg (155 lb)] 70.3 kg (155 lb) (01/24 0547)  Physical Exam: BP Readings from Last 1 Encounters:  02/17/17 (!) 141/55     Wt Readings from Last 1 Encounters:  02/17/17 70.3 kg (155 lb)    Weight change:  Body mass index is 28.35 kg/m. HEENT: [place]/AT, Eyes-Brown, PERL, EOMI, Conjunctiva-Pale, Sclera-Non-icteric, moon face. Neck: No JVD, No bruit, Trachea midline. Lungs:  Mild wheezing with poor air movement, Bilateral. Cardiac:  Regular rhythm, normal S1 and S2, no S3. III/VI systolic and II/VI diastolic murmur. Abdomen:  Soft, non-tender. BS present. Extremities:  No edema present. No cyanosis. No clubbing. CNS: AxOx3, Cranial nerves grossly intact, moves all 4 extremities. Appears anxious. Skin: Warm and dry.   Intake/Output from previous day: No intake/output data recorded.    Lab Results: BMET    Component Value Date/Time   NA 141 08/13/2016 1120   NA 139 04/15/2016 1443   NA 139 03/29/2016 1200   NA 139 09/04/2014 0406   NA 139 09/03/2014 1045   K 3.4 (L) 08/13/2016 1120   K 3.6 04/15/2016  1443   K 3.7 03/29/2016 1200   K 4.4 09/04/2014 0406   K 4.3 09/03/2014 1045   CL 104 03/29/2016 1200   CL 111 09/04/2014 0406   CL 107 09/03/2014 1045   CO2 25 08/13/2016 1120   CO2 24 04/15/2016 1443   CO2 27 03/29/2016 1200   CO2 22 09/04/2014 0406   CO2 22 09/03/2014 1045   GLUCOSE 123 08/13/2016 1120   GLUCOSE 85 04/15/2016 1443   GLUCOSE 101 (H) 03/29/2016 1200   GLUCOSE 132 (H) 09/04/2014 0406   GLUCOSE 108 (H) 09/03/2014 1045   BUN 15.8 08/13/2016 1120   BUN 18.5 04/15/2016 1443   BUN 16 03/29/2016 1200   BUN 36 (H) 09/04/2014 0406   BUN 31 (H) 09/03/2014 1045   CREATININE 1.1 08/13/2016 1120   CREATININE 1.2 (H) 04/15/2016 1443   CREATININE 1.15 (H) 03/29/2016 1200   CREATININE 1.19 (H) 09/04/2014 0406   CREATININE 1.36 (H) 09/03/2014 1045   CALCIUM 9.1 08/13/2016 1120   CALCIUM 9.7 04/15/2016 1443   CALCIUM 9.5 03/29/2016 1200   CALCIUM 9.1 09/04/2014 0406   CALCIUM 9.0 09/03/2014 1045   GFRNONAA 47 (L) 03/29/2016 1200   GFRNONAA 46 (L) 09/04/2014 0406   GFRNONAA 39 (L) 09/03/2014 1045   GFRAA 55 (L) 03/29/2016 1200   GFRAA 53 (L) 09/04/2014 0406   GFRAA 45 (L) 09/03/2014 1045   CBC    Component Value Date/Time   WBC 8.3  02/17/2017 0615   RBC 3.27 (L) 02/17/2017 0615   HGB 8.5 (L) 02/17/2017 0615   HGB 9.0 (L) 08/13/2016 1120   HCT 28.4 (L) 02/17/2017 0615   HCT 30.1 (L) 08/13/2016 1120   PLT 229 02/17/2017 0615   PLT 175 08/13/2016 1120   MCV 86.9 02/17/2017 0615   MCV 79.1 (L) 08/13/2016 1120   MCH 26.0 02/17/2017 0615   MCHC 29.9 (L) 02/17/2017 0615   RDW 14.3 02/17/2017 0615   RDW 15.4 (H) 08/13/2016 1120   LYMPHSABS 1.4 08/13/2016 1120   MONOABS 0.5 08/13/2016 1120   EOSABS 0.2 08/13/2016 1120   BASOSABS 0.0 08/13/2016 1120   HEPATIC Function Panel Recent Labs    03/29/16 1200 04/15/16 1443 08/13/16 1120  PROT 8.2* 8.2 7.7   HEMOGLOBIN A1C No components found for: HGA1C,  MPG CARDIAC ENZYMES No results found for: CKTOTAL, CKMB,  CKMBINDEX, TROPONINI BNP No results for input(s): PROBNP in the last 8760 hours. TSH No results for input(s): TSH in the last 8760 hours. CHOLESTEROL No results for input(s): CHOL in the last 8760 hours.  Scheduled Meds: . heparin  5,000 Units Subcutaneous Q8H  . sodium chloride flush  3 mL Intravenous Q12H  . sodium chloride flush  3 mL Intravenous Q12H   Continuous Infusions: . sodium chloride    . sodium chloride 100 mL/hr (02/17/17 0924)  . sodium chloride    . sodium chloride 1 mL/kg/hr (02/17/17 0730)   PRN Meds:.sodium chloride, sodium chloride, acetaminophen, sodium chloride flush, sodium chloride flush  Assessment/Plan: Acute exacerbation of COPD R/O CHF Anemia of chronic disease CAD Hypertension  Place in observation Continue home medications Oxygen, nebulizer treatment. Solumedrol already given in cath lab.    LOS: 0 days    Dixie Dials  MD  02/17/2017, 11:52 AM

## 2017-02-17 NOTE — Plan of Care (Signed)
Oriented to room/unit. Monitor Rt groin site s/p LHC for complications r/t infection, bleeding etc. Wheezes noted upper airway. Scheduled nebs ordered. Diet ordered.

## 2017-02-17 NOTE — Plan of Care (Signed)
Pt oriented to unit, plan of care, and method of reporting concerns. Verbalizes understanding of need to call for assistance prior to ambulation. Call light and bedside table within reach 

## 2017-02-18 ENCOUNTER — Ambulatory Visit: Payer: Medicare Other | Admitting: Internal Medicine

## 2017-02-18 ENCOUNTER — Encounter (HOSPITAL_COMMUNITY): Payer: Self-pay | Admitting: Cardiology

## 2017-02-18 DIAGNOSIS — I110 Hypertensive heart disease with heart failure: Secondary | ICD-10-CM | POA: Diagnosis not present

## 2017-02-18 DIAGNOSIS — I25118 Atherosclerotic heart disease of native coronary artery with other forms of angina pectoris: Secondary | ICD-10-CM | POA: Diagnosis not present

## 2017-02-18 DIAGNOSIS — F329 Major depressive disorder, single episode, unspecified: Secondary | ICD-10-CM | POA: Diagnosis not present

## 2017-02-18 DIAGNOSIS — R9439 Abnormal result of other cardiovascular function study: Secondary | ICD-10-CM | POA: Diagnosis not present

## 2017-02-18 LAB — CBC
HEMATOCRIT: 26.9 % — AB (ref 36.0–46.0)
Hemoglobin: 8.2 g/dL — ABNORMAL LOW (ref 12.0–15.0)
MCH: 26.6 pg (ref 26.0–34.0)
MCHC: 30.5 g/dL (ref 30.0–36.0)
MCV: 87.3 fL (ref 78.0–100.0)
Platelets: 223 10*3/uL (ref 150–400)
RBC: 3.08 MIL/uL — AB (ref 3.87–5.11)
RDW: 14.5 % (ref 11.5–15.5)
WBC: 10.5 10*3/uL (ref 4.0–10.5)

## 2017-02-18 LAB — BASIC METABOLIC PANEL
Anion gap: 9 (ref 5–15)
BUN: 19 mg/dL (ref 6–20)
CHLORIDE: 110 mmol/L (ref 101–111)
CO2: 23 mmol/L (ref 22–32)
Calcium: 8.9 mg/dL (ref 8.9–10.3)
Creatinine, Ser: 1.04 mg/dL — ABNORMAL HIGH (ref 0.44–1.00)
GFR calc Af Amer: >60 mL/min (ref >60)
GFR calc non Af Amer: 53 mL/min — ABNORMAL LOW (ref >60)
Glucose, Bld: 150 mg/dL — ABNORMAL HIGH (ref 65–99)
POTASSIUM: 3.5 mmol/L (ref 3.5–5.1)
SODIUM: 142 mmol/L (ref 135–145)

## 2017-02-18 NOTE — Care Management Obs Status (Signed)
MEDICARE OBSERVATION STATUS NOTIFICATION   Patient Details  Name: Suzanne Dickson MRN: [id number] Date of Birth: October 08, 1945   Medicare Observation Status Notification Given:  Yes    Carles Collet, RN 02/18/2017, 10:32 AM

## 2017-02-18 NOTE — Progress Notes (Signed)
The patient has been given discharge instructions along with a new medication list and what to take today. She has been educated on heart cath site care. She discharging with family via car.   Saddie Benders RN

## 2017-02-18 NOTE — Care Management Note (Addendum)
Case Management Note  Patient Details  Name: YOUA DELONEY MRN: [id number] Date of Birth: [birth year]  Subjective/Objective: Pt presented for Cath and now treating for COPD exacerbation. PTA Independent from home. Pt states she is on home 02. Pt has 02 tanks for travel home.                    Action/Plan: Per pt she has no home needs at this time.    Expected Discharge Date:  02/18/17               Expected Discharge Plan:  Home/Self Care  In-House Referral:  NA  Discharge planning Services  CM Consult  Post Acute Care Choice:  NA Choice offered to:  NA  DME Arranged:  N/A DME Agency:  NA  HH Arranged:    [place] Agency:  NA  Status of Service:  Completed, signed off  If discussed at [place] of Stay Meetings, dates discussed:    Additional Comments: 1201 02-18-17 Jacqlyn Krauss, RN, BSN [phone] CM did speak with pt and family delivered 02 tank to room for home travel. Pt is declining HH PT services at this time. CM did make pt aware that if she gets home and needs services to contact PCP for orders. No further needs from CM at this time.  Bethena Roys, RN 02/18/2017, 11:45 AM

## 2017-02-18 NOTE — Evaluation (Signed)
Physical Therapy Evaluation Patient Details Name: Suzanne Dickson MRN: [id number] DOB: [birth year] Today's Date: 02/18/2017   History of Present Illness  pt is a 72 y/o female with pmh significant for HTN, COPD, Lung CA s/p rdx, admitted after respiratory decompensation post diagnostic CATH.  Clinical Impression  Pt admitted with/for SOB, respiratory decom post CATH.  Pt not needing assist, but is clearly deconditioned.  Pt currently limited functionally due to the problems listed below.  (see problems list.)  Pt will benefit from HHPT to maximize function, conditioning and safety to manage well with available assist.      Follow Up Recommendations Home health PT;Supervision - Intermittent    Equipment Recommendations  None recommended by PT    Recommendations for Other Services       Precautions / Restrictions Precautions Precautions: Other (comment)(watch O2 sats) Restrictions Weight Bearing Restrictions: No      Mobility  Bed Mobility Overal bed mobility: Independent                Transfers Overall transfer level: Independent                  Ambulation/Gait Ambulation/Gait assistance: Supervision Ambulation Distance (Feet): 130 Feet Assistive device: None Gait Pattern/deviations: Step-through pattern   Gait velocity interpretation: at or above normal speed for age/gender General Gait Details: pt steady with good gait pattern, but sats dipped down into the mid 80's on 2L [place].  On 4L, pt maintained low 90's  Stairs            Wheelchair Mobility    Modified Rankin (Stroke Patients Only)       Balance Overall balance assessment: Needs assistance   Sitting balance-Leahy Scale: Normal       Standing balance-Leahy Scale: Fair Standing balance comment: not tested formally                             Pertinent Vitals/Pain Pain Assessment: No/denies pain    Home Living Family/patient expects to be discharged to:: Private  residence Living Arrangements: Spouse/significant other(and son) Available Help at Discharge: Family;Available PRN/intermittently(can be more often as needed.) Type of Home: House Home Access: Stairs to enter Entrance Stairs-Rails: None;Left;Right(depending on the door) Entrance Stairs-Number of Steps: 2 Home Layout: Two level;Able to live on main level with bedroom/bathroom Home Equipment: None      Prior Function Level of Independence: Independent         Comments: used portable O2 variably, but more consistently in the last 2 weeks     Hand Dominance        Extremity/Trunk Assessment   Upper Extremity Assessment Upper Extremity Assessment: Overall WFL for tasks assessed    Lower Extremity Assessment Lower Extremity Assessment: Overall WFL for tasks assessed       Communication   Communication: No difficulties  Cognition Arousal/Alertness: Awake/alert Behavior During Therapy: WFL for tasks assessed/performed Overall Cognitive Status: Within Functional Limits for tasks assessed                                        General Comments General comments (skin integrity, edema, etc.): pt able to initiate pused lip breathing without cues to get her repiratory status under subjective control without panic.  However her sats dipped into the mid 80's %    Exercises  Assessment/Plan    PT Assessment All further PT needs can be met in the next venue of care  PT Problem List Decreased activity tolerance;Decreased mobility;Cardiopulmonary status limiting activity       PT Treatment Interventions      PT Goals (Current goals can be found in the Care Plan section)  Acute Rehab PT Goals PT Goal Formulation: All assessment and education complete, DC therapy    Frequency     Barriers to discharge        Co-evaluation               AM-PAC PT "6 Clicks" Daily Activity  Outcome Measure Difficulty turning over in bed (including adjusting  bedclothes, sheets and blankets)?: None Difficulty moving from lying on back to sitting on the side of the bed? : None Difficulty sitting down on and standing up from a chair with arms (e.g., wheelchair, bedside commode, etc,.)?: None Help needed moving to and from a bed to chair (including a wheelchair)?: None Help needed walking in hospital room?: A Little Help needed climbing 3-5 steps with a railing? : A Little 6 Click Score: 22    End of Session   Activity Tolerance: Patient tolerated treatment well Patient left: in bed(sitting EOB) Nurse Communication: Mobility status PT Visit Diagnosis: Difficulty in walking, not elsewhere classified (R26.2)    Time: 1035-1101 PT Time Calculation (min) (ACUTE ONLY): 26 min   Charges:   PT Evaluation $PT Eval Moderate Complexity: 1 Mod PT Treatments $Gait Training: 8-22 mins   PT G Codes:        03-16-17  Donnella Sham, PT [phone]  (pager)  Tessie Fass Arnaldo Heffron March 16, 2017, 11:54 AM

## 2017-02-18 NOTE — Discharge Instructions (Signed)
Coronary Angiogram °A coronary angiogram is an X-ray procedure that is used to examine the arteries in the heart. In this procedure, a dye (contrast dye) is injected through a long, thin tube (catheter). The catheter is inserted through the groin, wrist, or arm. The dye is injected into each artery, then X-rays are taken to show if there is a blockage in the arteries of the heart. This procedure can also show if you have valve disease or a disease of the aorta, and it can be used to check the overall function of your heart muscle. You may have a coronary angiogram if: °· You are having chest pain, or other symptoms of angina, and you are at risk for heart disease. °· You have an abnormal electrocardiogram (ECG) or stress test. °· You have chest pain and heart failure. °· You are having irregular heart rhythms. °· You and your health care provider determine that the benefits of the test information outweigh the risks of the procedure. ° °Let your health care provider know about: °· Any allergies you have, including allergies to contrast dye. °· All medicines you are taking, including vitamins, herbs, eye drops, creams, and over-the-counter medicines. °· Any problems you or family members have had with anesthetic medicines. °· Any blood disorders you have. °· Any surgeries you have had. °· History of kidney problems or kidney failure. °· Any medical conditions you have. °· Whether you are pregnant or may be pregnant. °What are the risks? °Generally, this is a safe procedure. However, problems may occur, including: °· Infection. °· Allergic reaction to medicines or dyes that are used. °· Bleeding from the access site or other locations. °· Kidney injury, especially in people with impaired kidney function. °· Stroke (rare). °· Heart attack (rare). °· Damage to other structures or organs. ° °What happens before the procedure? °Staying hydrated °Follow instructions from your health care provider about hydration, which may  include: °· Up to 2 hours before the procedure - you may continue to drink clear liquids, such as water, clear fruit juice, black coffee, and plain tea. ° °Eating and drinking restrictions °Follow instructions from your health care provider about eating and drinking, which may include: °· 8 hours before the procedure - stop eating heavy meals or foods such as meat, fried foods, or fatty foods. °· 6 hours before the procedure - stop eating light meals or foods, such as toast or cereal. °· 2 hours before the procedure - stop drinking clear liquids. ° °General instructions °· Ask your health care provider about: °? Changing or stopping your regular medicines. This is especially important if you are taking diabetes medicines or blood thinners. °? Taking medicines such as ibuprofen. These medicines can thin your blood. Do not take these medicines before your procedure if your health care provider instructs you not to, though aspirin may be recommended prior to coronary angiograms. °· Plan to have someone take you home from the hospital or clinic. °· You may need to have blood tests or X-rays done. °What happens during the procedure? °· An IV tube will be inserted into one of your veins. °· You will be given one or more of the following: °? A medicine to help you relax (sedative). °? A medicine to numb the area where the catheter will be inserted into an artery (local anesthetic). °· To reduce your risk of infection: °? Your health care team will wash or sanitize their hands. °? Your skin will be washed with soap. °?   Hair may be removed from the area where the catheter will be inserted.  You will be connected to a continuous ECG monitor.  The catheter will be inserted into an artery. The location may be in your groin, in your wrist, or in the fold of your arm (near your elbow).  A type of X-ray (fluoroscopy) will be used to help guide the catheter to the opening of the blood vessel that is being examined.  A dye  will be injected into the catheter, and X-rays will be taken. The dye will help to show where any narrowing or blockages are located in the heart arteries.  Tell your health care provider if you have any chest pain or trouble breathing during the procedure.  If blockages are found, your health care provider may perform another procedure, such as inserting a coronary stent. The procedure may vary among health care providers and hospitals. What happens after the procedure?  After the procedure, you will need to keep the area still for a few hours, or for as long as told by your health care provider. If the procedure is done through the groin, you will be instructed to not bend and not cross your legs.  The insertion site will be checked frequently.  The pulse in your foot or wrist will be checked frequently.  You may have additional blood tests, X-rays, and a test that records the electrical activity of your heart (ECG).  Do not drive for 24 hours if you were given a sedative. Summary  A coronary angiogram is an X-ray procedure that is used to look into the arteries in the heart.  During the procedure, a dye (contrast dye) is injected through a long, thin tube (catheter). The catheter is inserted through the groin, wrist, or arm.  Tell your health care provider about any allergies you have, including allergies to contrast dye.  After the procedure, you will need to keep the area still for a few hours, or for as long as told by your health care provider. This information is not intended to replace advice given to you by your health care provider. Make sure you discuss any questions you have with your health care provider. Document Released: 07/18/2002 Document Revised: 10/24/2015 Document Reviewed: 10/24/2015 Elsevier Interactive Patient Education  2018 [place] After This sheet gives you information about how to care for yourself after your procedure. Your health  care provider may also give you more specific instructions. If you have problems or questions, contact your health care provider. What can I expect after the procedure? After the procedure, it is common to have bruising and tenderness at the catheter insertion area. Follow these instructions at home: Insertion site care  Follow instructions from your health care provider about how to take care of your insertion site. Make sure you: ? Wash your hands with soap and water before you change your bandage (dressing). If soap and water are not available, use hand sanitizer. ? Change your dressing as told by your health care provider. ? Leave stitches (sutures), skin glue, or adhesive strips in place. These skin closures may need to stay in place for 2 weeks or longer. If adhesive strip edges start to loosen and curl up, you may trim the loose edges. Do not remove adhesive strips completely unless your health care provider tells you to do that.  Do not take baths, swim, or use a hot tub until your health care provider approves.  You may shower  24-48 hours after the procedure or as told by your health care provider. ? Gently wash the site with plain soap and water. ? Pat the area dry with a clean towel. ? Do not rub the site. This may cause bleeding.  Do not apply powder or lotion to the site. Keep the site clean and dry.  Check your insertion site every day for signs of infection. Check for: ? Redness, swelling, or pain. ? Fluid or blood. ? Warmth. ? Pus or a bad smell. Activity  Rest as told by your health care provider, usually for 1-2 days.  Do not lift anything that is heavier than 10 lbs. (4.5 kg) or as told by your health care provider.  Do not drive for 24 hours if you were given a medicine to help you relax (sedative).  Do not drive or use heavy machinery while taking prescription pain medicine. General instructions  Return to your normal activities as told by your health care  provider, usually in about a week. Ask your health care provider what activities are safe for you.  If the catheter site starts bleeding, lie flat and put pressure on the site. If the bleeding does not stop, get help right away. This is a medical emergency.  Drink enough fluid to keep your urine clear or pale yellow. This helps flush the contrast dye from your body.  Take over-the-counter and prescription medicines only as told by your health care provider.  Keep all follow-up visits as told by your health care provider. This is important. Contact a health care provider if:  You have a fever or chills.  You have redness, swelling, or pain around your insertion site.  You have fluid or blood coming from your insertion site.  The insertion site feels warm to the touch.  You have pus or a bad smell coming from your insertion site.  You have bruising around the insertion site.  You notice blood collecting in the tissue around the catheter site (hematoma). The hematoma may be painful to the touch. Get help right away if:  You have severe pain at the catheter insertion area.  The catheter insertion area swells very fast.  The catheter insertion area is bleeding, and the bleeding does not stop when you hold steady pressure on the area.  The area near or just beyond the catheter insertion site becomes pale, cool, tingly, or numb. These symptoms may represent a serious problem that is an emergency. Do not wait to see if the symptoms will go away. Get medical help right away. Call your local emergency services (911 in the [place] Do not drive yourself to the hospital. Summary  After the procedure, it is common to have bruising and tenderness at the catheter insertion area.  After the procedure, it is important to rest and drink plenty of fluids.  Do not take baths, swim, or use a hot tub until your health care provider says it is okay to do so. You may shower 24-48 hours after the  procedure or as told by your health care provider.  If the catheter site starts bleeding, lie flat and put pressure on the site. If the bleeding does not stop, get help right away. This is a medical emergency. This information is not intended to replace advice given to you by your health care provider. Make sure you discuss any questions you have with your health care provider. Document Released: 07/30/2004 Document Revised: 12/17/2015 Document Reviewed: 12/17/2015 Elsevier Interactive Patient Education  2018 [place].

## 2017-02-18 NOTE — Discharge Summary (Signed)
Discharge summary dictated on 02/18/2017, dictation number is [phone]

## 2017-02-19 NOTE — Discharge Summary (Signed)
Suzanne Dickson, [name]               ACCOUNT NO.:  [id number]  MEDICAL RECORD NO.:  [id number]  LOCATION:  6E08C                        FACILITY:  [facility]  PHYSICIAN:  Kaesen Rodriguez N. Terrence Dupont, M.D. DATE OF BIRTH:  09/16/1945  DATE OF ADMISSION:  02/17/2017 DATE OF DISCHARGE:  02/18/2017                              DISCHARGE SUMMARY   ADMITTING DIAGNOSES: 1. New onset angina, abnormal nuclear stress test, rule out coronary     insufficiency. 2. Chronic obstructive pulmonary disease. 3. Chronic depression. 4. Hypertension. 5. Hyperlipidemia. 6. Obstructive sleep apnea. 7. History of squamous cell carcinoma of the left lung, status post     radiation in the past.  FINAL DIAGNOSES: 1. Stable angina, status post left cardiac catheterization. 2. Status post exacerbation of chronic obstructive pulmonary disease. 3. Chronic depression. 4. Hypertension. 5. Hyperlipidemia. 6. Obstructive sleep apnea. 7. Squamous cell carcinoma of left lung, status post radiation in the     past.  DISCHARGE HOME MEDICATIONS: 1. Amlodipine 5 mg daily. 2. Aspirin 81 mg daily. 3. Bevespi inhaler 2 puffs twice daily. 4. Clopidogrel 75 mg daily. 5. Benadryl 25 mg at bedtime as needed. 6. Mucinex 600 mg daily as needed. 7. Claritin 10 mg as needed. 8. Metoprolol succinate 25 mg daily. 9. Nitrostat sublingual p.r.n. 10.O2 two liters by nasal cannula. 11.Zoloft 100 mg 2 times daily as before. 12.Feosol 1 tablet 3 times daily.  DIET:  Low-salt, low-cholesterol diet.  The patient has been given post-cardiac cath instructions.  FOLLOWUP: 1. Follow up with me in 1 week. 2. Follow up with Pulmonary as scheduled.  CONDITION AT DISCHARGE:  Stable.  BRIEF HISTORY AND HOSPITAL COURSE:  Ms. Suzanne Dickson is a 72 year old female with past medical history significant for hypertension, COPD, on home O2, history of carcinoma of the left lung, status post radiation, history of congestive heart failure, attributed viral  myocarditis in 1994, GERD, sleep apnea.  She complains of exertional chest pain described as tightness with minimal exertion, relieves with rest.  Also complains of exertional dyspnea with minimal exertion.  Denies any nausea, vomiting, or diaphoresis.  Denies PND, orthopnea, leg swelling. Denies palpitation, lightheadedness, or syncope.  The patient underwent Lexiscan Myoview on January 28, 2017, which showed moderate mid to distal inferior wall ischemia with EF of 58%.  PHYSICAL EXAMINATION:  GENERAL:  She was alert, awake, oriented x3, in no acute distress. VITAL SIGNS:  Blood pressure was 123/74, pulse 97, regular. HEENT:  Conjunctivae were pink. NECK:  Supple.  No JVD.  No bruit. LUNGS:  Clear to auscultation without rhonchi or rales. CARDIOVASCULAR:  S1, S2 were normal.  There was 2/6 systolic murmur. ABDOMEN:  Soft.  Bowel sounds are present.  Nontender. EXTREMITIES:  There was no clubbing, cyanosis, or edema. NEURO:  Grossly intact.  LABORATORY DATA:  Her hemoglobin was 8.5, hematocrit 28.4, white count of 8.3 which has been stable.  Sodium was 142, potassium 3.5, glucose 150, BUN 19, creatinine 1.04.  Her BNP was slightly elevated at 225. Troponin I was negative.  BRIEF HOSPITAL COURSE:  The patient was a.m. admit and underwent left cardiac catheterization with left and right coronary angiography and LV- graphy.  The patient tolerated the  procedure well.  The patient was noted to have critical RCA stenosis.  The patient was recently started on Plavix, which she has been tolerating well.  Has quit taking her iron pills.  Her hemoglobin has been marginal.  Postprocedure, the patient went into acute respiratory distress, requiring nebulizer treatment with improvement in her breathing.  The patient did not have any chest pain during the hospital stay.  Her oxygenation on 2 liters is ranging between 93-97%.  The patient has appointment to see Pulmonary in the near future.  She  will be followed up in my office in 1 week.  The patient will be scheduled for PCI to RCA in future once her hemoglobin is improved and she is able to tolerate dual antiplatelet medication.     Allegra Lai. Terrence Dupont, M.D.     MNH/MEDQ  D:  02/18/2017  T:  02/19/2017  Job:  [id number]

## 2017-02-22 ENCOUNTER — Ambulatory Visit (HOSPITAL_COMMUNITY)
Admission: RE | Admit: 2017-02-22 | Discharge: 2017-02-22 | Disposition: A | Discharge status: Home / Self Care | Payer: Medicare Other | Source: Ambulatory Visit | Attending: Internal Medicine | Admitting: Internal Medicine

## 2017-02-22 ENCOUNTER — Inpatient Hospital Stay: Payer: Medicare Other | Attending: Internal Medicine

## 2017-02-22 ENCOUNTER — Other Ambulatory Visit: Payer: Medicare Other

## 2017-02-22 DIAGNOSIS — I700 Atherosclerosis of aorta: Secondary | ICD-10-CM | POA: Insufficient documentation

## 2017-02-22 DIAGNOSIS — I2510 Atherosclerotic heart disease of native coronary artery without angina pectoris: Secondary | ICD-10-CM | POA: Diagnosis not present

## 2017-02-22 DIAGNOSIS — C3492 Malignant neoplasm of unspecified part of left bronchus or lung: Secondary | ICD-10-CM | POA: Diagnosis present

## 2017-02-22 DIAGNOSIS — Z9981 Dependence on supplemental oxygen: Secondary | ICD-10-CM | POA: Diagnosis not present

## 2017-02-22 DIAGNOSIS — J441 Chronic obstructive pulmonary disease with (acute) exacerbation: Secondary | ICD-10-CM | POA: Insufficient documentation

## 2017-02-22 DIAGNOSIS — J449 Chronic obstructive pulmonary disease, unspecified: Secondary | ICD-10-CM | POA: Diagnosis not present

## 2017-02-22 DIAGNOSIS — Z79899 Other long term (current) drug therapy: Secondary | ICD-10-CM | POA: Insufficient documentation

## 2017-02-22 DIAGNOSIS — R0602 Shortness of breath: Secondary | ICD-10-CM | POA: Insufficient documentation

## 2017-02-22 DIAGNOSIS — C3412 Malignant neoplasm of upper lobe, left bronchus or lung: Secondary | ICD-10-CM | POA: Insufficient documentation

## 2017-02-22 DIAGNOSIS — J439 Emphysema, unspecified: Secondary | ICD-10-CM | POA: Insufficient documentation

## 2017-02-22 DIAGNOSIS — N200 Calculus of kidney: Secondary | ICD-10-CM | POA: Diagnosis not present

## 2017-02-22 LAB — COMPREHENSIVE METABOLIC PANEL
ALBUMIN: 3.7 g/dL (ref 3.5–5.0)
ALK PHOS: 67 U/L (ref 40–150)
ALT: 15 U/L (ref 0–55)
AST: 19 U/L (ref 5–34)
Anion gap: 8 (ref 3–11)
BILIRUBIN TOTAL: 0.4 mg/dL (ref 0.2–1.2)
BUN: 17 mg/dL (ref 7–26)
CALCIUM: 8.9 mg/dL (ref 8.4–10.4)
CO2: 25 mmol/L (ref 22–29)
CREATININE: 1.15 mg/dL — AB (ref 0.60–1.10)
Chloride: 109 mmol/L (ref 98–109)
GFR calc Af Amer: 54 mL/min — ABNORMAL LOW (ref >60)
GFR calc non Af Amer: 47 mL/min — ABNORMAL LOW (ref >60)
GLUCOSE: 164 mg/dL — AB (ref 70–140)
Potassium: 4 mmol/L (ref 3.3–4.7)
Sodium: 142 mmol/L (ref 136–145)
TOTAL PROTEIN: 7.6 g/dL (ref 6.4–8.3)

## 2017-02-22 LAB — CBC WITH DIFFERENTIAL/PLATELET
BASOS ABS: 0 10*3/uL (ref 0.0–0.1)
BASOS PCT: 0 %
EOS PCT: 3 %
Eosinophils Absolute: 0.2 10*3/uL (ref 0.0–0.5)
HEMATOCRIT: 32.9 % — AB (ref 34.8–46.6)
Hemoglobin: 9.4 g/dL — ABNORMAL LOW (ref 11.6–15.9)
Lymphocytes Relative: 20 %
Lymphs Abs: 1.6 10*3/uL (ref 0.9–3.3)
MCH: 26.3 pg (ref 25.1–34.0)
MCHC: 28.6 g/dL — AB (ref 31.5–36.0)
MCV: 92.2 fL (ref 79.5–101.0)
MONO ABS: 0.5 10*3/uL (ref 0.1–0.9)
MONOS PCT: 6 %
NEUTROS ABS: 5.5 10*3/uL (ref 1.5–6.5)
Neutrophils Relative %: 71 %
PLATELETS: 216 10*3/uL (ref 145–400)
RBC: 3.57 MIL/uL — ABNORMAL LOW (ref 3.70–5.45)
RDW: 18.1 % — AB (ref 11.2–16.1)
WBC: 7.8 10*3/uL (ref 3.9–10.3)

## 2017-02-22 MED ORDER — IOPAMIDOL (ISOVUE-300) INJECTION 61%
75.0000 mL | Freq: Once | INTRAVENOUS | Status: AC | PRN
  Administered 2017-02-22: 75 mL via INTRAVENOUS

## 2017-02-22 MED ORDER — IOPAMIDOL (ISOVUE-300) INJECTION 61%
INTRAVENOUS | Status: AC
  Filled 2017-02-22: qty 75

## 2017-02-24 ENCOUNTER — Other Ambulatory Visit: Payer: Self-pay

## 2017-02-24 ENCOUNTER — Telehealth: Payer: Self-pay | Admitting: Internal Medicine

## 2017-02-24 ENCOUNTER — Ambulatory Visit
Admission: RE | Admit: 2017-02-24 | Discharge: 2017-02-24 | Disposition: A | Discharge status: Home / Self Care | Payer: Medicare Other | Source: Ambulatory Visit | Attending: Radiation Oncology | Admitting: Radiation Oncology

## 2017-02-24 ENCOUNTER — Inpatient Hospital Stay (HOSPITAL_BASED_OUTPATIENT_CLINIC_OR_DEPARTMENT_OTHER): Payer: Medicare Other | Admitting: Internal Medicine

## 2017-02-24 ENCOUNTER — Encounter: Payer: Self-pay | Admitting: Internal Medicine

## 2017-02-24 VITALS — BP 147/64 | HR 88 | Temp 98.2°F | Ht 62.0 in

## 2017-02-24 VITALS — BP 126/55 | HR 94 | Temp 98.3°F | Resp 18 | Ht 62.0 in

## 2017-02-24 DIAGNOSIS — J441 Chronic obstructive pulmonary disease with (acute) exacerbation: Secondary | ICD-10-CM | POA: Diagnosis not present

## 2017-02-24 DIAGNOSIS — Z79899 Other long term (current) drug therapy: Secondary | ICD-10-CM | POA: Diagnosis not present

## 2017-02-24 DIAGNOSIS — C3492 Malignant neoplasm of unspecified part of left bronchus or lung: Secondary | ICD-10-CM

## 2017-02-24 DIAGNOSIS — Z9981 Dependence on supplemental oxygen: Secondary | ICD-10-CM

## 2017-02-24 DIAGNOSIS — R0602 Shortness of breath: Secondary | ICD-10-CM | POA: Diagnosis not present

## 2017-02-24 DIAGNOSIS — C3412 Malignant neoplasm of upper lobe, left bronchus or lung: Secondary | ICD-10-CM | POA: Diagnosis not present

## 2017-02-24 DIAGNOSIS — C3490 Malignant neoplasm of unspecified part of unspecified bronchus or lung: Secondary | ICD-10-CM

## 2017-02-24 NOTE — Progress Notes (Signed)
Radiation Oncology         [phone] ________________________________  Name: Suzanne Dickson MRN: [id number]  Date: 02/24/2017  DOB: 1945/12/20    Follow-Up Visit Note  CC: Patient, No Pcp Per  Collene Gobble, MD    ICD-10-CM   1. Adenocarcinoma of left lung, stage 1 (HCC) C34.92     Diagnosis: Adenocarcinoma of the left lung, Stage 1  Interval Since Last Radiation: 10 months 05/04/16-05/12/16: 54 Gy to the left lung in 3 fractions  Narrative:  The patient returns today for routine follow-up. She was recently hospitalized for angina and said she needs to have stents. She reports needing to build up hemoglobin first. She continues to have some left chest pain and takes nitroglycerin as needed. She reports having shortness of breath and uses 2 L of oxygen. She has a dry cough in the morning. She reports fatigue. She denies hemoptysis. She was seen by Dr. Julien Nordmann this morning to review recent CT scan.  Repeat CT Chest on 02/22/17 showed shrinkage of the treated lesion. There was progressive increase in surrounding changes of external beam radiation as aspected.                         ALLERGIES:  has No Known Allergies.  Meds: Current Outpatient Medications  Medication Sig Dispense Refill  . albuterol (PROAIR HFA) 108 (90 Base) MCG/ACT inhaler Inhale 2 puffs into the lungs every 4 (four) hours as needed. 2 puffs every 4 hours as needed only  if your can't catch your breath (Patient taking differently: Inhale 2 puffs into the lungs every 4 (four) hours as needed for wheezing or shortness of breath. ) 1 Inhaler 2  . albuterol (PROVENTIL) (2.5 MG/3ML) 0.083% nebulizer solution Take 3 mLs (2.5 mg total) by nebulization every 4 (four) hours as needed. For shortness of breath 75 mL 12  . amLODipine (NORVASC) 5 MG tablet Take 1 tablet (5 mg total) by mouth daily. 30 tablet 3  . aspirin 81 MG tablet Take 81 mg by mouth daily.    Marland Kitchen BEVESPI AEROSPHERE 9-4.8 MCG/ACT AERO inhale 2 puffs  twice a day 10.7 g 11  . clopidogrel (PLAVIX) 75 MG tablet Take 75 mg by mouth daily.    . diphenhydrAMINE (SOMINEX) 25 MG tablet Take 25 mg by mouth at bedtime as needed for sleep.    . famotidine (PEPCID) 20 MG tablet Take 1 tablet (20 mg total) by mouth 2 (two) times daily. (Patient taking differently: Take 20 mg by mouth 2 (two) times daily as needed for heartburn. ) 60 tablet 11  . ferrous sulfate 325 (65 FE) MG EC tablet Take 325 mg by mouth 3 (three) times daily with meals.    Marland Kitchen guaiFENesin (MUCINEX) 600 MG 12 hr tablet Take 600 mg by mouth daily as needed (allergies).    . loratadine (CLARITIN) 10 MG tablet Take 10 mg by mouth daily as needed for allergies.    . metoprolol succinate (TOPROL-XL) 25 MG 24 hr tablet Take 25 mg by mouth daily.    . nitroGLYCERIN (NITROSTAT) 0.4 MG SL tablet Place 0.4 mg under the tongue every 5 (five) minutes as needed for chest pain.    . OXYGEN 2lpm with sleep and 4 lpm with exertion    . sertraline (ZOLOFT) 100 MG tablet Take 100 mg by mouth 2 (two) times daily.  0   No current facility-administered medications for this encounter.  Physical Findings: The patient is in no acute distress. Patient is alert and oriented.  height is 5\' 2"  (1.575 m). Her oral temperature is 98.2 F (36.8 C). Her blood pressure is 147/64 (abnormal) and her pulse is 88. Her oxygen saturation is 90%. .  No significant changes. Lungs are clear to auscultation bilaterally. Heart has regular rate and rhythm. No palpable cervical, supraclavicular, or axillary adenopathy. Patient is on 2L/min oxygen via a nasal canula. She presents today in a wheelchair.  Lab Findings: Lab Results  Component Value Date   WBC 7.8 02/22/2017   HGB 9.4 (L) 02/22/2017   HCT 32.9 (L) 02/22/2017   MCV 92.2 02/22/2017   PLT 216 02/22/2017    Radiographic Findings: Ct Chest W Contrast  Result Date: 02/22/2017 CLINICAL DATA:  Restaging adenocarcinoma of the left lung.  Cough. EXAM: CT CHEST WITH  CONTRAST TECHNIQUE: Multidetector CT imaging of the chest was performed during intravenous contrast administration. CONTRAST:  68mL ISOVUE-300 IOPAMIDOL (ISOVUE-300) INJECTION 61% COMPARISON:  08/24/2016 FINDINGS: Cardiovascular: Normal heart size. No pericardial effusion. Aortic atherosclerosis. Calcification in the LAD and left circumflex and RCA. Mediastinum/Nodes: The trachea appears patent and is midline. Normal appearance of the esophagus. Small subcentimeter mediastinal lymph nodes are identified. Similar to previous exam. Prominent right hilar lymph node measures 11 mm, image 93 of series 2. Previously 10 mm. No axillary or supraclavicular adenopathy. Lungs/Pleura: No pleural effusion. Moderate to advanced changes of emphysema identified. The spiculated lesion within the left upper lobe measures 1.0 x 1.4 cm, image 6 8 of series 2. Previously 1.4 x 1.7 cm. There is surrounding areas of ground-glass attenuation and airspace consolidation within a geographic distribution favored to represent sequelae of external beam radiation. Increased from previous exam. Two metallic fiducial markers are identified within the adjacent left upper lobe. Upper Abdomen: No acute abnormalities identified within the upper abdomen. Previous cholecystectomy. Right kidney stones identified. 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 Cyst within the upper pole of the right kidney measures 4.2 cm. Musculoskeletal: No aggressive lytic or sclerotic bone lesions. IMPRESSION: 1. The index lesion within the left upper lobe is not significantly changed in size in the interval. There has been progressive increase in surrounding changes of external beam radiation. 2. No specific findings identified to suggest metastatic disease within the chest. 3. Aortic Atherosclerosis (ICD10-I70.0) and Emphysema (ICD10-J43.9). Three vessel coronary artery calcifications noted. 4. Kidney stones Electronically Signed   By:  Kerby Moors M.D.   On: 02/22/2017 16:15   Nm Myocar Multi W/spect W/wall Motion / Ef  Result Date: 01/28/2017 CLINICAL DATA:  Chest pain. History of hypertension, congestive heart failure and lung cancer. EXAM: MYOCARDIAL IMAGING WITH SPECT (REST AND PHARMACOLOGIC-STRESS) GATED LEFT VENTRICULAR WALL MOTION STUDY LEFT VENTRICULAR EJECTION FRACTION TECHNIQUE: Standard myocardial SPECT imaging was performed after resting intravenous injection of 10 mCi Tc-87m tetrofosmin. Subsequently, intravenous infusion of Lexiscan was performed under the supervision of the Cardiology staff. At peak effect of the drug, 30 mCi Tc-63m tetrofosmin was injected intravenously and standard myocardial SPECT imaging was performed. Quantitative gated imaging was also performed to evaluate left ventricular wall motion, and estimate left ventricular ejection fraction. COMPARISON:  Chest CT 08/24/2016. FINDINGS: Perfusion: There is a moderate size reversible defect involving the mid to distal inferior wall. Summed difference score is 5. No other fixed or reversible perfusion defects. Wall Motion: Normal left ventricular wall motion. No left ventricular dilation. Left Ventricular Ejection Fraction: 58 % End diastolic volume 87  ml End systolic volume 37 ml IMPRESSION: 1. Moderate-sized reversible perfusion defect in the mid to distal inferior wall, suspicious for pharmacologically induced myocardial ischemia. 2. Normal left ventricular wall motion. 3. Left ventricular ejection fraction 58% 4. Non invasive risk stratification*: Intermediate *2012 Appropriate Use Criteria for Coronary Revascularization Focused Update: J Am Coll Cardiol. 1314;38(8):875-797. http://content.[email or web address]?articleid=[email or web address] Electronically Signed   By: Richardean Sale M.D.   On: 01/28/2017 13:55   Portable Chest 1 View  Result Date: 02/17/2017 CLINICAL DATA:  Shortness of breath. EXAM: PORTABLE CHEST 1 VIEW COMPARISON:  CT 08/24/2016.  Chest x-ray  08/17/2016, 12/23/2015. FINDINGS: Mediastinum and hilar structures normal. Stable cardiomegaly. Postsurgical changes left mid lung. Previously identified left mid lung lesion is no longer identified. Chronic bilateral interstitial prominence noted consistent chronic interstitial lung disease. No change from prior exams. IMPRESSION: 1. Postsurgical changes left lung. Previously identified density in the left mid lung is no longer identified. Chronic interstitial changes consistent chronic interstitial lung disease. 2.  Stable cardiomegaly.  No pulmonary venous congestion. Electronically Signed   By: Marcello Moores  Register   On: 02/17/2017 11:58    Impression:  Adenocarcinoma of the left lung, Stage 1, clinical Stage I, s/p SBRT. Recent hospitalization for cardiac issues. She will eventually have stents placed. CT which showed shrinkage of area treated with SBRT.  Plan: The patient would like to limit her number of doctor appointments. Therefore she will proceed to follow-up with Dr. Julien Nordmann in July with repeat CT scan and follow-up in rad/onc prn.  -----------------------------------  Blair Promise, PhD, MD  This document serves as a record of services personally performed by Gery Pray, MD. It was created on his behalf by Bethann Humble, a trained medical scribe. The creation of this record is based on the scribe's personal observations and the provider's statements to them. This document has been checked and approved by the attending provider.

## 2017-02-24 NOTE — Progress Notes (Signed)
[place] Telephone:(336) [phone]   [phone]  OFFICE PROGRESS NOTE  Patient, No Pcp Per No address on file  DIAGNOSIS: Stage IA (T1a, N0, M0) non-small cell lung cancer, adenocarcinoma presented with left upper lobe lung nodule diagnosed in March 2018.  PRIOR THERAPY: S/P RT to the left lung nodule under the care of Dr. Sondra Come completed on 05/04/2016.  CURRENT THERAPY: Observation.  INTERVAL HISTORY: Suzanne Dickson 72 y.o. female returns to the clinic today for follow-up visit accompanied by her husband.  The patient continues to complain of baseline shortness of breath and she is currently on home oxygen.  She was seen by her cardiologist recently and underwent stress test that was suspicious for myocardial ischemia.  She was supposed to have cardiac catheterization but this is currently on hold secondary to her anemia.  She denied having any chest pain, cough or hemoptysis.  She denied having any recent weight loss or night sweats.  She had repeat CT scan of the chest performed recently and she is here for evaluation and discussion of her scan results.  MEDICAL HISTORY: Past Medical History:  Diagnosis Date  . Adenocarcinoma of left lung, stage 1 ([place]) 04/15/2016  . Anxiety   . Arthritis   . Bronchitis, chronic ([place])   . COPD (chronic obstructive pulmonary disease) ([place])   . Depression   . Dyspnea    with exertion  . GERD (gastroesophageal reflux disease)   . History of blood transfusion    as a teen   . History of radiation therapy 05/04/16 - 05/12/16   Left lung treated to 54 Gy with 3 fx of 18 Gy  . Hypertension   . Pneumonia 10/2015    ALLERGIES:  has No Known Allergies.  MEDICATIONS:  Current Outpatient Medications  Medication Sig Dispense Refill  . albuterol (PROAIR HFA) 108 (90 Base) MCG/ACT inhaler Inhale 2 puffs into the lungs every 4 (four) hours as needed. 2 puffs every 4 hours as needed only  if your can't catch your breath  (Patient taking differently: Inhale 2 puffs into the lungs every 4 (four) hours as needed for wheezing or shortness of breath. ) 1 Inhaler 2  . albuterol (PROVENTIL) (2.5 MG/3ML) 0.083% nebulizer solution Take 3 mLs (2.5 mg total) by nebulization every 4 (four) hours as needed. For shortness of breath 75 mL 12  . amLODipine (NORVASC) 5 MG tablet Take 1 tablet (5 mg total) by mouth daily. 30 tablet 3  . aspirin 81 MG tablet Take 81 mg by mouth daily.    Marland Kitchen BEVESPI AEROSPHERE 9-4.8 MCG/ACT AERO inhale 2 puffs twice a day 10.7 g 11  . clopidogrel (PLAVIX) 75 MG tablet Take 75 mg by mouth daily.    . diphenhydrAMINE (SOMINEX) 25 MG tablet Take 25 mg by mouth at bedtime as needed for sleep.    . famotidine (PEPCID) 20 MG tablet Take 1 tablet (20 mg total) by mouth 2 (two) times daily. (Patient taking differently: Take 20 mg by mouth 2 (two) times daily as needed for heartburn. ) 60 tablet 11  . guaiFENesin (MUCINEX) 600 MG 12 hr tablet Take 600 mg by mouth daily as needed (allergies).    . loratadine (CLARITIN) 10 MG tablet Take 10 mg by mouth daily as needed for allergies.    . metoprolol succinate (TOPROL-XL) 25 MG 24 hr tablet Take 25 mg by mouth daily.    . nitroGLYCERIN (NITROSTAT) 0.4 MG SL tablet Place 0.4  mg under the tongue every 5 (five) minutes as needed for chest pain.    . OXYGEN 2lpm with sleep and 4 lpm with exertion    . sertraline (ZOLOFT) 100 MG tablet Take 100 mg by mouth 2 (two) times daily.  0   No current facility-administered medications for this visit.     SURGICAL HISTORY:  Past Surgical History:  Procedure Laterality Date  . CHOLECYSTECTOMY    . COLONOSCOPY    . FUDUCIAL PLACEMENT Left 03/31/2016   Procedure: PLACEMENT OF FUDUCIAL LEFT UPPER LOBE;  Surgeon: Collene Gobble, MD;  Location: [place];  Service: Thoracic;  Laterality: Left;  . LEFT HEART CATH AND CORONARY ANGIOGRAPHY N/A 02/17/2017   Procedure: LEFT HEART CATH AND CORONARY ANGIOGRAPHY;  Surgeon: [name], MD;   Location: [facility] [facility];  Service: Cardiovascular;  Laterality: N/A;  . SINUSOTOMY    . VIDEO BRONCHOSCOPY WITH ENDOBRONCHIAL NAVIGATION N/A 03/31/2016   Procedure: VIDEO BRONCHOSCOPY WITH ENDOBRONCHIAL NAVIGATION;  Surgeon: Collene Gobble, MD;  Location: [place];  Service: Thoracic;  Laterality: N/A;    REVIEW OF SYSTEMS:  A comprehensive review of systems was negative except for: Constitutional: positive for fatigue Respiratory: positive for dyspnea on exertion   PHYSICAL EXAMINATION: General appearance: alert, cooperative, fatigued and no distress Head: Normocephalic, without obvious abnormality, atraumatic Neck: no adenopathy, no JVD, supple, symmetrical, trachea midline and thyroid not enlarged, symmetric, no tenderness/mass/nodules Lymph nodes: Cervical, supraclavicular, and axillary nodes normal. Resp: wheezes bilaterally Back: symmetric, no curvature. ROM normal. No CVA tenderness. Cardio: regular rate and rhythm, S1, S2 normal, no murmur, click, rub or gallop GI: soft, non-tender; bowel sounds normal; no masses,  no organomegaly Extremities: extremities normal, atraumatic, no cyanosis or edema  ECOG PERFORMANCE STATUS: 1 - Symptomatic but completely ambulatory  Blood pressure (!) 126/55, pulse 94, temperature 98.3 F (36.8 C), resp. rate 18, height 5\' 2"  (1.575 m), SpO2 94 %.  LABORATORY DATA: Lab Results  Component Value Date   WBC 7.8 02/22/2017   HGB 9.4 (L) 02/22/2017   HCT 32.9 (L) 02/22/2017   MCV 92.2 02/22/2017   PLT 216 02/22/2017      Chemistry      Component Value Date/Time   NA 142 02/22/2017 1159   NA 141 08/13/2016 1120   K 4.0 02/22/2017 1159   K 3.4 (L) 08/13/2016 1120   CL 109 02/22/2017 1159   CO2 25 02/22/2017 1159   CO2 25 08/13/2016 1120   BUN 17 02/22/2017 1159   BUN 15.8 08/13/2016 1120   CREATININE 1.15 (H) 02/22/2017 1159   CREATININE 1.1 08/13/2016 1120      Component Value Date/Time   CALCIUM 8.9 02/22/2017 1159   CALCIUM 9.1  08/13/2016 1120   ALKPHOS 67 02/22/2017 1159   ALKPHOS 68 08/13/2016 1120   AST 19 02/22/2017 1159   AST 20 08/13/2016 1120   ALT 15 02/22/2017 1159   ALT 14 08/13/2016 1120   BILITOT 0.4 02/22/2017 1159   BILITOT 0.47 08/13/2016 1120       RADIOGRAPHIC STUDIES: Ct Chest W Contrast  Result Date: 02/22/2017 CLINICAL DATA:  Restaging adenocarcinoma of the left lung.  Cough. EXAM: CT CHEST WITH CONTRAST TECHNIQUE: Multidetector CT imaging of the chest was performed during intravenous contrast administration. CONTRAST:  22mL ISOVUE-300 IOPAMIDOL (ISOVUE-300) INJECTION 61% COMPARISON:  08/24/2016 FINDINGS: Cardiovascular: Normal heart size. No pericardial effusion. Aortic atherosclerosis. Calcification in the LAD and left circumflex and RCA. Mediastinum/Nodes: The trachea appears patent and is  midline. Normal appearance of the esophagus. Small subcentimeter mediastinal lymph nodes are identified. Similar to previous exam. Prominent right hilar lymph node measures 11 mm, image 93 of series 2. Previously 10 mm. No axillary or supraclavicular adenopathy. Lungs/Pleura: No pleural effusion. Moderate to advanced changes of emphysema identified. The spiculated lesion within the left upper lobe measures 1.0 x 1.4 cm, image 6 8 of series 2. Previously 1.4 x 1.7 cm. There is surrounding areas of ground-glass attenuation and airspace consolidation within a geographic distribution favored to represent sequelae of external beam radiation. Increased from previous exam. Two metallic fiducial markers are identified within the adjacent left upper lobe. Upper Abdomen: No acute abnormalities identified within the upper abdomen. Previous cholecystectomy. Right kidney stones identified. 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 Cyst within the upper pole of the right kidney measures 4.2 cm. Musculoskeletal: No aggressive lytic or sclerotic bone lesions. IMPRESSION: 1. The index lesion within  the left upper lobe is not significantly changed in size in the interval. There has been progressive increase in surrounding changes of external beam radiation. 2. No specific findings identified to suggest metastatic disease within the chest. 3. Aortic Atherosclerosis (ICD10-I70.0) and Emphysema (ICD10-J43.9). Three vessel coronary artery calcifications noted. 4. Kidney stones Electronically Signed   By: Kerby Moors M.D.   On: 02/22/2017 16:15   Nm Myocar Multi W/spect W/wall Motion / Ef  Result Date: 01/28/2017 CLINICAL DATA:  Chest pain. History of hypertension, congestive heart failure and lung cancer. EXAM: MYOCARDIAL IMAGING WITH SPECT (REST AND PHARMACOLOGIC-STRESS) GATED LEFT VENTRICULAR WALL MOTION STUDY LEFT VENTRICULAR EJECTION FRACTION TECHNIQUE: Standard myocardial SPECT imaging was performed after resting intravenous injection of 10 mCi Tc-80m tetrofosmin. Subsequently, intravenous infusion of Lexiscan was performed under the supervision of the Cardiology staff. At peak effect of the drug, 30 mCi Tc-24m tetrofosmin was injected intravenously and standard myocardial SPECT imaging was performed. Quantitative gated imaging was also performed to evaluate left ventricular wall motion, and estimate left ventricular ejection fraction. COMPARISON:  Chest CT 08/24/2016. FINDINGS: Perfusion: There is a moderate size reversible defect involving the mid to distal inferior wall. Summed difference score is 5. No other fixed or reversible perfusion defects. Wall Motion: Normal left ventricular wall motion. No left ventricular dilation. Left Ventricular Ejection Fraction: 58 % End diastolic volume 87 ml End systolic volume 37 ml IMPRESSION: 1. Moderate-sized reversible perfusion defect in the mid to distal inferior wall, suspicious for pharmacologically induced myocardial ischemia. 2. Normal left ventricular wall motion. 3. Left ventricular ejection fraction 58% 4. Non invasive risk stratification*: Intermediate  *2012 Appropriate Use Criteria for Coronary Revascularization Focused Update: J Am Coll Cardiol. 2202;54(2):706-237. http://content.[email or web address]?[email or web address] Electronically Signed   By: Richardean Sale M.D.   On: 01/28/2017 13:55   Portable Chest 1 View  Result Date: 02/17/2017 CLINICAL DATA:  Shortness of breath. EXAM: PORTABLE CHEST 1 VIEW COMPARISON:  CT 08/24/2016.  Chest x-ray 08/17/2016, 12/23/2015. FINDINGS: Mediastinum and hilar structures normal. Stable cardiomegaly. Postsurgical changes left mid lung. Previously identified left mid lung lesion is no longer identified. Chronic bilateral interstitial prominence noted consistent chronic interstitial lung disease. No change from prior exams. IMPRESSION: 1. Postsurgical changes left lung. Previously identified density in the left mid lung is no longer identified. Chronic interstitial changes consistent chronic interstitial lung disease. 2.  Stable cardiomegaly.  No pulmonary venous congestion. Electronically Signed   By: Marcello Moores  Register   On: 02/17/2017 11:58    ASSESSMENT AND PLAN: This is a very pleasant  72 years old white female with a stage IA non-small cell lung cancer, adenocarcinoma presented with left upper lobe lung nodule. The patient is status post stereotactic radiotherapy to the left upper lobe lung nodule and she tolerated the procedure well. Her recent CT scan of the chest showed no concerning findings for disease progression. I discussed the scan results with the patient and recommended for her to continue on observation with repeat CT scan of the chest in 6 months. For COPD, she will continue her care under Dr. Melvyn Novas. For the coronary artery disease, she is followed by Dr. Terrence Dupont. The patient was advised to call immediately if she has any concerning symptoms in the interval. The patient voices understanding of current disease status and treatment options and is in agreement with the current care plan. All  questions were answered. The patient knows to call the clinic with any problems, questions or concerns. We can certainly see the patient much sooner if necessary. I spent 10 minutes counseling the patient face to face. The total time spent in the appointment was 15 minutes.  Disclaimer: This note was dictated with voice recognition software. Similar sounding words can inadvertently be transcribed and may not be corrected upon review.

## 2017-02-24 NOTE — Telephone Encounter (Signed)
Scheduled appt per 1/31 los - Gave patient AVS and calender per los. Central radiology to contact patient with ct scan .

## 2017-02-24 NOTE — Progress Notes (Signed)
Suzanne Dickson is here for follow up.  She was recently hospitalized for angina and said she needs to have stents but she needs to build up her hemoglobin first.  She continues to have some left chest pain and takes nitroglycerin as needed.  She reports having shortness of breath and is using 2 L of oxygen.  She has a dry cough in the morning.  She reports having fatigue.  She did see Dr. Julien Nordmann today and had a CT scan on 02/22/17.  BP (!) 147/64 (BP Location: Left Arm, Patient Position: Sitting)   Pulse 88   Temp 98.2 F (36.8 C) (Oral)   Ht 5\' 2"  (1.575 m)   SpO2 90% Comment: 2L of oxygen  BMI 28.47 kg/m    Wt Readings from Last 3 Encounters:  02/18/17 155 lb 10.3 oz (70.6 kg)  01/07/17 156 lb 12.8 oz (71.1 kg)  11/17/16 154 lb (69.9 kg)

## 2017-03-01 ENCOUNTER — Ambulatory Visit: Payer: Medicare Other | Admitting: Internal Medicine

## 2017-04-05 ENCOUNTER — Other Ambulatory Visit: Payer: Self-pay

## 2017-04-05 ENCOUNTER — Encounter (HOSPITAL_COMMUNITY): Payer: Self-pay | Admitting: General Practice

## 2017-04-05 ENCOUNTER — Ambulatory Visit (HOSPITAL_COMMUNITY)
Admission: RE | Disposition: A | Discharge status: Home / Self Care | Payer: Self-pay | Source: Ambulatory Visit | Attending: Cardiology

## 2017-04-05 ENCOUNTER — Ambulatory Visit (HOSPITAL_COMMUNITY)
Admission: RE | Admit: 2017-04-05 | Discharge: 2017-04-06 | Disposition: A | Discharge status: Home / Self Care | Payer: Medicare Other | Source: Ambulatory Visit | Attending: Cardiology | Admitting: Cardiology

## 2017-04-05 DIAGNOSIS — I509 Heart failure, unspecified: Secondary | ICD-10-CM | POA: Diagnosis not present

## 2017-04-05 DIAGNOSIS — Z7982 Long term (current) use of aspirin: Secondary | ICD-10-CM | POA: Insufficient documentation

## 2017-04-05 DIAGNOSIS — K219 Gastro-esophageal reflux disease without esophagitis: Secondary | ICD-10-CM | POA: Diagnosis not present

## 2017-04-05 DIAGNOSIS — E785 Hyperlipidemia, unspecified: Secondary | ICD-10-CM | POA: Diagnosis not present

## 2017-04-05 DIAGNOSIS — I25110 Atherosclerotic heart disease of native coronary artery with unstable angina pectoris: Secondary | ICD-10-CM | POA: Diagnosis not present

## 2017-04-05 DIAGNOSIS — Z87891 Personal history of nicotine dependence: Secondary | ICD-10-CM | POA: Insufficient documentation

## 2017-04-05 DIAGNOSIS — G4733 Obstructive sleep apnea (adult) (pediatric): Secondary | ICD-10-CM | POA: Insufficient documentation

## 2017-04-05 DIAGNOSIS — Z7902 Long term (current) use of antithrombotics/antiplatelets: Secondary | ICD-10-CM | POA: Insufficient documentation

## 2017-04-05 DIAGNOSIS — D638 Anemia in other chronic diseases classified elsewhere: Secondary | ICD-10-CM | POA: Diagnosis not present

## 2017-04-05 DIAGNOSIS — I200 Unstable angina: Secondary | ICD-10-CM | POA: Diagnosis present

## 2017-04-05 DIAGNOSIS — F339 Major depressive disorder, recurrent, unspecified: Secondary | ICD-10-CM | POA: Diagnosis not present

## 2017-04-05 DIAGNOSIS — Z9981 Dependence on supplemental oxygen: Secondary | ICD-10-CM | POA: Diagnosis not present

## 2017-04-05 DIAGNOSIS — I110 Hypertensive heart disease with heart failure: Secondary | ICD-10-CM | POA: Diagnosis not present

## 2017-04-05 DIAGNOSIS — J449 Chronic obstructive pulmonary disease, unspecified: Secondary | ICD-10-CM | POA: Diagnosis not present

## 2017-04-05 DIAGNOSIS — Z923 Personal history of irradiation: Secondary | ICD-10-CM | POA: Diagnosis not present

## 2017-04-05 HISTORY — DX: Dependence on other enabling machines and devices: Z99.89

## 2017-04-05 HISTORY — DX: Headache: R51

## 2017-04-05 HISTORY — DX: Heart failure, unspecified: I50.9

## 2017-04-05 HISTORY — DX: Dependence on supplemental oxygen: Z99.81

## 2017-04-05 HISTORY — DX: Headache, unspecified: R51.9

## 2017-04-05 HISTORY — PX: CORONARY ANGIOPLASTY WITH STENT PLACEMENT: SHX49

## 2017-04-05 HISTORY — PX: CORONARY STENT INTERVENTION: CATH118234

## 2017-04-05 HISTORY — DX: Obstructive sleep apnea (adult) (pediatric): G47.33

## 2017-04-05 LAB — POCT ACTIVATED CLOTTING TIME: Activated Clotting Time: 417 seconds

## 2017-04-05 SURGERY — CORONARY STENT INTERVENTION
Anesthesia: LOCAL

## 2017-04-05 MED ORDER — SODIUM CHLORIDE 0.9 % WEIGHT BASED INFUSION
3.0000 mL/kg/h | INTRAVENOUS | Status: DC
  Administered 2017-04-05: 3 mL/kg/h via INTRAVENOUS

## 2017-04-05 MED ORDER — METHYLPREDNISOLONE SODIUM SUCC 125 MG IJ SOLR
INTRAMUSCULAR | Status: DC | PRN
  Administered 2017-04-05: 125 mg via INTRAVENOUS

## 2017-04-05 MED ORDER — SODIUM CHLORIDE 0.9 % IV SOLN: 250.0000 mL | INTRAVENOUS | Status: DC | PRN

## 2017-04-05 MED ORDER — ONDANSETRON HCL 4 MG/2ML IJ SOLN: 4.0000 mg | Freq: Four times a day (QID) | INTRAMUSCULAR | Status: DC | PRN

## 2017-04-05 MED ORDER — HEPARIN (PORCINE) IN NACL 2-0.9 UNIT/ML-% IJ SOLN
INTRAMUSCULAR | Status: AC
  Filled 2017-04-05: qty 500

## 2017-04-05 MED ORDER — FAMOTIDINE IN NACL 20-0.9 MG/50ML-% IV SOLN
INTRAVENOUS | Status: AC
  Administered 2017-04-05: 20 mg via INTRAVENOUS
  Filled 2017-04-05: qty 50

## 2017-04-05 MED ORDER — FERROUS SULFATE 325 (65 FE) MG PO TABS
325.0000 mg | ORAL_TABLET | Freq: Three times a day (TID) | ORAL | Status: DC
  Administered 2017-04-05 – 2017-04-06 (×3): 325 mg via ORAL
  Filled 2017-04-05 (×3): qty 1

## 2017-04-05 MED ORDER — NITROGLYCERIN 0.4 MG SL SUBL: 0.4000 mg | SUBLINGUAL_TABLET | SUBLINGUAL | Status: DC | PRN

## 2017-04-05 MED ORDER — ALBUTEROL SULFATE (2.5 MG/3ML) 0.083% IN NEBU: 2.5000 mg | INHALATION_SOLUTION | Freq: Four times a day (QID) | RESPIRATORY_TRACT | Status: DC | PRN

## 2017-04-05 MED ORDER — LEVALBUTEROL HCL 1.25 MG/0.5ML IN NEBU
1.2500 mg | INHALATION_SOLUTION | Freq: Three times a day (TID) | RESPIRATORY_TRACT | Status: DC
  Administered 2017-04-05 – 2017-04-06 (×3): 1.25 mg via RESPIRATORY_TRACT
  Filled 2017-04-05 (×4): qty 0.5

## 2017-04-05 MED ORDER — ACETAMINOPHEN 325 MG PO TABS
650.0000 mg | ORAL_TABLET | ORAL | Status: DC | PRN
  Administered 2017-04-05 – 2017-04-06 (×2): 650 mg via ORAL
  Filled 2017-04-05 (×2): qty 2

## 2017-04-05 MED ORDER — SERTRALINE HCL 50 MG PO TABS
100.0000 mg | ORAL_TABLET | Freq: Every day | ORAL | Status: DC
  Administered 2017-04-06: 100 mg via ORAL
  Filled 2017-04-05: qty 2

## 2017-04-05 MED ORDER — IOPAMIDOL (ISOVUE-370) INJECTION 76%
INTRAVENOUS | Status: AC
  Filled 2017-04-05: qty 100

## 2017-04-05 MED ORDER — CLOPIDOGREL BISULFATE 300 MG PO TABS
ORAL_TABLET | ORAL | Status: DC | PRN
  Administered 2017-04-05: 300 mg via ORAL

## 2017-04-05 MED ORDER — NITROGLYCERIN IN D5W 200-5 MCG/ML-% IV SOLN
INTRAVENOUS | Status: AC | PRN
  Administered 2017-04-05: 10 ug/min via INTRAVENOUS

## 2017-04-05 MED ORDER — METOPROLOL SUCCINATE ER 25 MG PO TB24
25.0000 mg | ORAL_TABLET | Freq: Every day | ORAL | Status: DC
  Administered 2017-04-06: 11:00:00 25 mg via ORAL
  Filled 2017-04-05: qty 1

## 2017-04-05 MED ORDER — LIDOCAINE HCL 1 % IJ SOLN
INTRAMUSCULAR | Status: AC
  Filled 2017-04-05: qty 20

## 2017-04-05 MED ORDER — LIDOCAINE HCL (PF) 1 % IJ SOLN
INTRAMUSCULAR | Status: DC | PRN
  Administered 2017-04-05: 20 mL

## 2017-04-05 MED ORDER — ASPIRIN EC 81 MG PO TBEC
81.0000 mg | DELAYED_RELEASE_TABLET | Freq: Every day | ORAL | Status: DC
  Administered 2017-04-06: 81 mg via ORAL
  Filled 2017-04-05: qty 1

## 2017-04-05 MED ORDER — NITROGLYCERIN 1 MG/10 ML FOR IR/CATH LAB
INTRA_ARTERIAL | Status: DC | PRN
  Administered 2017-04-05: 150 ug via INTRACORONARY
  Administered 2017-04-05: 100 ug via INTRACORONARY
  Administered 2017-04-05 (×2): 150 ug via INTRACORONARY

## 2017-04-05 MED ORDER — NITROGLYCERIN 1 MG/10 ML FOR IR/CATH LAB
INTRA_ARTERIAL | Status: AC
  Filled 2017-04-05: qty 10

## 2017-04-05 MED ORDER — HEPARIN (PORCINE) IN NACL 2-0.9 UNIT/ML-% IJ SOLN
INTRAMUSCULAR | Status: AC | PRN
  Administered 2017-04-05 (×2): 500 mL via INTRA_ARTERIAL

## 2017-04-05 MED ORDER — MIDAZOLAM HCL 2 MG/2ML IJ SOLN
INTRAMUSCULAR | Status: AC
  Filled 2017-04-05: qty 2

## 2017-04-05 MED ORDER — SODIUM CHLORIDE 0.9 % IV SOLN
INTRAVENOUS | Status: DC | PRN
  Administered 2017-04-05: 1.75 mg/kg/h via INTRAVENOUS

## 2017-04-05 MED ORDER — PANTOPRAZOLE SODIUM 40 MG PO TBEC: 40.0000 mg | DELAYED_RELEASE_TABLET | Freq: Every day | ORAL | Status: DC | PRN

## 2017-04-05 MED ORDER — LORATADINE 10 MG PO TABS: 10.0000 mg | ORAL_TABLET | Freq: Every day | ORAL | Status: DC | PRN

## 2017-04-05 MED ORDER — FAMOTIDINE IN NACL 20-0.9 MG/50ML-% IV SOLN
20.0000 mg | Freq: Two times a day (BID) | INTRAVENOUS | Status: DC
  Administered 2017-04-05 – 2017-04-06 (×3): 20 mg via INTRAVENOUS
  Filled 2017-04-05 (×3): qty 50

## 2017-04-05 MED ORDER — SODIUM CHLORIDE 0.9% FLUSH: 3.0000 mL | INTRAVENOUS | Status: DC | PRN

## 2017-04-05 MED ORDER — SODIUM CHLORIDE 0.9% FLUSH: 3.0000 mL | Freq: Two times a day (BID) | INTRAVENOUS | Status: DC

## 2017-04-05 MED ORDER — FENTANYL CITRATE (PF) 100 MCG/2ML IJ SOLN
INTRAMUSCULAR | Status: AC
  Filled 2017-04-05: qty 2

## 2017-04-05 MED ORDER — CLOPIDOGREL BISULFATE 75 MG PO TABS
ORAL_TABLET | ORAL | Status: AC
  Administered 2017-04-05: 225 mg via ORAL
  Filled 2017-04-05: qty 3

## 2017-04-05 MED ORDER — SODIUM CHLORIDE 0.9% FLUSH
3.0000 mL | Freq: Two times a day (BID) | INTRAVENOUS | Status: DC
  Administered 2017-04-05 – 2017-04-06 (×2): 3 mL via INTRAVENOUS

## 2017-04-05 MED ORDER — BIVALIRUDIN BOLUS VIA INFUSION - CUPID
INTRAVENOUS | Status: DC | PRN
  Administered 2017-04-05: 51.675 mg via INTRAVENOUS

## 2017-04-05 MED ORDER — BIVALIRUDIN TRIFLUOROACETATE 250 MG IV SOLR
INTRAVENOUS | Status: AC
  Filled 2017-04-05: qty 250

## 2017-04-05 MED ORDER — SODIUM CHLORIDE 0.9 % WEIGHT BASED INFUSION: 1.0000 mL/kg/h | INTRAVENOUS | Status: DC

## 2017-04-05 MED ORDER — CLOPIDOGREL BISULFATE 300 MG PO TABS
ORAL_TABLET | ORAL | Status: AC
  Filled 2017-04-05: qty 1

## 2017-04-05 MED ORDER — FENTANYL CITRATE (PF) 100 MCG/2ML IJ SOLN
INTRAMUSCULAR | Status: DC | PRN
  Administered 2017-04-05 (×3): 25 ug via INTRAVENOUS

## 2017-04-05 MED ORDER — SODIUM CHLORIDE 0.9 % IV SOLN: INTRAVENOUS | Status: AC

## 2017-04-05 MED ORDER — CLOPIDOGREL BISULFATE 75 MG PO TABS
75.0000 mg | ORAL_TABLET | Freq: Every day | ORAL | Status: DC
  Administered 2017-04-06: 75 mg via ORAL
  Filled 2017-04-05: qty 1

## 2017-04-05 MED ORDER — IOPAMIDOL (ISOVUE-370) INJECTION 76%
INTRAVENOUS | Status: DC | PRN
  Administered 2017-04-05: 150 mL via INTRA_ARTERIAL

## 2017-04-05 MED ORDER — CLOPIDOGREL BISULFATE 75 MG PO TABS
300.0000 mg | ORAL_TABLET | ORAL | Status: AC
  Administered 2017-04-05: 225 mg via ORAL

## 2017-04-05 MED ORDER — NITROGLYCERIN IN D5W 200-5 MCG/ML-% IV SOLN
INTRAVENOUS | Status: AC
  Filled 2017-04-05: qty 250

## 2017-04-05 MED ORDER — ASPIRIN 81 MG PO CHEW: 81.0000 mg | CHEWABLE_TABLET | ORAL | Status: DC

## 2017-04-05 MED ORDER — GUAIFENESIN ER 600 MG PO TB12
600.0000 mg | ORAL_TABLET | Freq: Every day | ORAL | Status: DC | PRN
  Filled 2017-04-05: qty 1

## 2017-04-05 MED ORDER — MIDAZOLAM HCL 2 MG/2ML IJ SOLN
INTRAMUSCULAR | Status: DC | PRN
  Administered 2017-04-05: 1 mg via INTRAVENOUS

## 2017-04-05 MED ORDER — FLUTICASONE FUROATE-VILANTEROL 200-25 MCG/INH IN AEPB
1.0000 | INHALATION_SPRAY | Freq: Every day | RESPIRATORY_TRACT | Status: DC
  Administered 2017-04-06: 09:00:00 1 via RESPIRATORY_TRACT
  Filled 2017-04-05: qty 28

## 2017-04-05 SURGICAL SUPPLY — 30 items
BALLN EMERGE MR 2.0X12 (BALLOONS) ×2
BALLN EMERGE MR 2.0X15 (BALLOONS) ×2
BALLN EMERGE MR 2.5X12 (BALLOONS) ×2
BALLN WOLVERINE 2.00X10 (BALLOONS) ×2
BALLN ~~LOC~~ EMERGE MR 2.0X12 (BALLOONS) ×2
BALLN ~~LOC~~ EMERGE MR 2.5X12 (BALLOONS) ×2
BALLN ~~LOC~~ EMERGE MR 3.0X20 (BALLOONS) ×2
BALLN ~~LOC~~ EMERGE MR 3.5X15 (BALLOONS) ×2
BALLN ~~LOC~~ EMERGE MR 3.5X20 (BALLOONS) ×2
BALLOON EMERGE MR 2.0X12 (BALLOONS) IMPLANT
BALLOON EMERGE MR 2.0X15 (BALLOONS) IMPLANT
BALLOON EMERGE MR 2.5X12 (BALLOONS) IMPLANT
BALLOON WOLVERINE 2.00X10 (BALLOONS) IMPLANT
BALLOON ~~LOC~~ EMERGE MR 2.0X12 (BALLOONS) IMPLANT
BALLOON ~~LOC~~ EMERGE MR 2.5X12 (BALLOONS) IMPLANT
BALLOON ~~LOC~~ EMERGE MR 3.0X20 (BALLOONS) IMPLANT
BALLOON ~~LOC~~ EMERGE MR 3.5X15 (BALLOONS) IMPLANT
BALLOON ~~LOC~~ EMERGE MR 3.5X20 (BALLOONS) IMPLANT
CATH LAUNCHER 6FR AL.75 (CATHETERS) ×1 IMPLANT
GUIDELINER 6F (CATHETERS) ×1 IMPLANT
KIT ENCORE 26 ADVANTAGE (KITS) ×3 IMPLANT
KIT HEART LEFT (KITS) ×2 IMPLANT
PACK CARDIAC CATHETERIZATION (CUSTOM PROCEDURE TRAY) ×2 IMPLANT
SHEATH AVANTI 11CM 6FR (MISCELLANEOUS) ×1 IMPLANT
STENT SIERRA 3.00 X 38 MM (Permanent Stent) ×1 IMPLANT
TRANSDUCER W/STOPCOCK (MISCELLANEOUS) ×2 IMPLANT
TUBING CIL FLEX 10 FLL-RA (TUBING) ×2 IMPLANT
WIRE EMERALD 3MM-J .035X150CM (WIRE) ×1 IMPLANT
WIRE PT2 MS 185 (WIRE) ×1 IMPLANT
WIRE RUNTHROUGH .014X180CM (WIRE) ×1 IMPLANT

## 2017-04-05 NOTE — Progress Notes (Signed)
Subjective:  Doing well denies any chest pain or shortness of breath. Tolerated PCI to RCA.  Objective:  Vital Signs in the last 24 hours: Temp:  [97.6 F (36.4 C)-97.9 F (36.6 C)] 97.9 F (36.6 C) (03/12 1500) Pulse Rate:  [57-80] 74 (03/12 1500) Resp:  [12-33] 25 (03/12 1500) BP: (90-212)/(47-109) 139/47 (03/12 1500) SpO2:  [93 %-100 %] 95 % (03/12 1500) Weight:  [68.9 kg (152 lb)] 68.9 kg (152 lb) (03/12 0751)  Intake/Output from previous day: No intake/output data recorded. Intake/Output from this shift: Total I/O In: -  Out: 200 [Urine:200]  Physical Exam: Neck: no adenopathy, no carotid bruit, no JVD and supple, symmetrical, trachea midline Lungs: clear to auscultation bilaterally Heart: regular rate and rhythm, S1, S2 normal and soft systolic murmur noted Abdomen: soft, non-tender; bowel sounds normal; no masses,  no organomegaly Extremities: extremities normal, atraumatic, no cyanosis or edema and right groin stable  Lab Results: No results for input(s): WBC, HGB, PLT in the last 72 hours. No results for input(s): NA, K, CL, CO2, GLUCOSE, BUN, CREATININE in the last 72 hours. No results for input(s): TROPONINI in the last 72 hours.  Invalid input(s): CK, MB Hepatic Function Panel No results for input(s): PROT, ALBUMIN, AST, ALT, ALKPHOS, BILITOT, BILIDIR, IBILI in the last 72 hours. No results for input(s): CHOL in the last 72 hours. No results for input(s): PROTIME in the last 72 hours.  Imaging: Imaging results have been reviewed and No results found.  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  LOS: 0 days    [name] 04/05/2017, 4:33 PM

## 2017-04-05 NOTE — H&P (Signed)
The printed H&P in the chart needs to be scanned

## 2017-04-05 NOTE — Interval H&P Note (Signed)
Cath Lab Visit (complete for each Cath Lab visit)  Clinical Evaluation Leading to the Procedure:   ACS: No.  Non-ACS:    Anginal Classification: CCS III  Anti-ischemic medical therapy: Maximal Therapy (2 or more classes of medications)  Non-Invasive Test Results: Intermediate-risk stress test findings: cardiac mortality 1-3%/year  Prior CABG: No previous CABG      History and Physical Interval Note:  04/05/2017 7:33 AM  Suzanne Dickson  has presented today for surgery, with the diagnosis of cad  The various methods of treatment have been discussed with the patient and family. After consideration of risks, benefits and other options for treatment, the patient has consented to  Procedure(s): CORONARY STENT INTERVENTION (N/A) as a surgical intervention .  The patient's history has been reviewed, patient examined, no change in status, stable for surgery.  I have reviewed the patient's chart and labs.  Questions were answered to the patient's satisfaction.     [name]

## 2017-04-06 ENCOUNTER — Encounter (HOSPITAL_COMMUNITY): Payer: Self-pay | Admitting: Cardiology

## 2017-04-06 DIAGNOSIS — E785 Hyperlipidemia, unspecified: Secondary | ICD-10-CM | POA: Diagnosis not present

## 2017-04-06 DIAGNOSIS — I25110 Atherosclerotic heart disease of native coronary artery with unstable angina pectoris: Secondary | ICD-10-CM | POA: Diagnosis not present

## 2017-04-06 DIAGNOSIS — I110 Hypertensive heart disease with heart failure: Secondary | ICD-10-CM | POA: Diagnosis not present

## 2017-04-06 DIAGNOSIS — I509 Heart failure, unspecified: Secondary | ICD-10-CM | POA: Diagnosis not present

## 2017-04-06 LAB — CBC
HCT: 30.6 % — ABNORMAL LOW (ref 36.0–46.0)
HEMOGLOBIN: 9.5 g/dL — AB (ref 12.0–15.0)
MCH: 28.1 pg (ref 26.0–34.0)
MCHC: 31 g/dL (ref 30.0–36.0)
MCV: 90.5 fL (ref 78.0–100.0)
Platelets: 175 10*3/uL (ref 150–400)
RBC: 3.38 MIL/uL — ABNORMAL LOW (ref 3.87–5.11)
RDW: 16 % — AB (ref 11.5–15.5)
WBC: 6 10*3/uL (ref 4.0–10.5)

## 2017-04-06 LAB — BASIC METABOLIC PANEL
Anion gap: 9 (ref 5–15)
BUN: 17 mg/dL (ref 6–20)
CALCIUM: 9 mg/dL (ref 8.9–10.3)
CO2: 23 mmol/L (ref 22–32)
Chloride: 109 mmol/L (ref 101–111)
Creatinine, Ser: 0.92 mg/dL (ref 0.44–1.00)
GFR calc Af Amer: >60 mL/min (ref >60)
GLUCOSE: 98 mg/dL (ref 65–99)
Potassium: 3.5 mmol/L (ref 3.5–5.1)
SODIUM: 141 mmol/L (ref 135–145)

## 2017-04-06 MED ORDER — ANGIOPLASTY BOOK
Freq: Once | Status: AC
  Administered 2017-04-06: 07:00:00
  Filled 2017-04-06: qty 1

## 2017-04-06 MED ORDER — HYDRALAZINE HCL 20 MG/ML IJ SOLN
10.0000 mg | Freq: Once | INTRAMUSCULAR | Status: AC
  Administered 2017-04-06: 10 mg via INTRAVENOUS
  Filled 2017-04-06: qty 1

## 2017-04-06 MED FILL — Heparin Sodium (Porcine) 2 Unit/ML in Sodium Chloride 0.9%: INTRAMUSCULAR | Qty: 1000 | Status: AC

## 2017-04-06 MED FILL — Lidocaine HCl Local Inj 1%: INTRAMUSCULAR | Qty: 20 | Status: AC

## 2017-04-06 NOTE — Progress Notes (Signed)
CARDIAC REHAB PHASE I   PRE:  Rate/Rhythm: 90 SR  BP:  Supine:   Sitting: 149/103  Standing:    SaO2: 92% 2L  MODE:  Ambulation: 200 ft   POST:  Rate/Rhythm: 96 ST  BP:  Supine:   Sitting: 162/62  Standing:    SaO2: 95% 3L  0930-1020 Pt sitting on EOB upon arrival.  Pt ambulated 200 ft with RW on 3L O2.  Pt stood and rested once.  Pt's oxygen saturation level dropped to 86%.  Pt reminded to breathe in through the nose versus through the mouth.  Oxygen saturation level increased to 92% after 2 min break. Pt would benefit from RW for home.  Education completed and all questions answered.  Verbalized understanding. Referral made to [facility].   Noel Christmas, RN 04/06/2017 10:17 AM

## 2017-04-06 NOTE — Discharge Instructions (Signed)
Coronary Angiogram With Stent Coronary angiogram with stent placement is a procedure to widen or open a narrow blood vessel of the heart (coronary artery). Arteries may become blocked by cholesterol buildup (plaques) in the lining of the wall. When a coronary artery becomes partially blocked, blood flow to that area decreases. This may lead to chest pain or a heart attack (myocardial infarction). A stent is a small piece of metal that looks like mesh or a spring. Stent placement may be done as treatment for a heart attack or right after a coronary angiogram in which a blocked artery is found. Let your health care provider know about:  Any allergies you have.  All medicines you are taking, including vitamins, herbs, eye drops, creams, and over-the-counter medicines.  Any problems you or family members have had with anesthetic medicines.  Any blood disorders you have.  Any surgeries you have had.  Any medical conditions you have.  Whether you are pregnant or may be pregnant. What are the risks? Generally, this is a safe procedure. However, problems may occur, including:  Damage to the heart or its blood vessels.  A return of blockage.  Bleeding, infection, or bruising at the insertion site.  A collection of blood under the skin (hematoma) at the insertion site.  A blood clot in another part of the body.  Kidney injury.  Allergic reaction to the dye or contrast that is used.  Bleeding into the abdomen (retroperitoneal bleeding).  What happens before the procedure? Staying hydrated Follow instructions from your health care provider about hydration, which may include:  Up to 2 hours before the procedure - you may continue to drink clear liquids, such as water, clear fruit juice, black coffee, and plain tea.  Eating and drinking restrictions Follow instructions from your health care provider about eating and drinking, which may include:  8 hours before the procedure - stop  eating heavy meals or foods such as meat, fried foods, or fatty foods.  6 hours before the procedure - stop eating light meals or foods, such as toast or cereal.  2 hours before the procedure - stop drinking clear liquids.  Ask your health care provider about:  Changing or stopping your regular medicines. This is especially important if you are taking diabetes medicines or blood thinners.  Taking medicines such as ibuprofen. These medicines can thin your blood. Do not take these medicines before your procedure if your health care provider instructs you not to. Generally, aspirin is recommended before a procedure of passing a small, thin tube (catheter) through a blood vessel and into the heart (cardiac catheterization).  What happens during the procedure?  An IV tube will be inserted into one of your veins.  You will be given one or more of the following: ? A medicine to help you relax (sedative). ? A medicine to numb the area where the catheter will be inserted into an artery (local anesthetic).  To reduce your risk of infection: ? Your health care team will wash or sanitize their hands. ? Your skin will be washed with soap. ? Hair may be removed from the area where the catheter will be inserted.  Using a guide wire, the catheter will be inserted into an artery. The location may be in your groin, in your wrist, or in the fold of your arm (near your elbow).  A type of X-ray (fluoroscopy) will be used to help guide the catheter to the opening of the arteries in the heart.  A dye will be injected into the catheter, and X-rays will be taken. The dye will help to show where any narrowing or blockages are located in the arteries.  A tiny wire will be guided to the blocked spot, and a balloon will be inflated to make the artery wider.  The stent will be expanded and will crush the plaques into the wall of the vessel. The stent will hold the area open and improve the blood flow. Most stents  have a drug coating to reduce the risk of the stent narrowing over time.  The artery may be made wider using a drill, laser, or other tools to remove plaques.  When the blood flow is better, the catheter will be removed. The lining of the artery will grow over the stent, which stays where it was placed. This procedure may vary among health care providers and hospitals. What happens after the procedure?  If the procedure is done through the leg, you will be kept in bed lying flat for about 6 hours. You will be instructed to not bend and not cross your legs.  The insertion site will be checked frequently.  The pulse in your foot or wrist will be checked frequently.  You may have additional blood tests, X-rays, and a test that records the electrical activity of your heart (electrocardiogram, or ECG). This information is not intended to replace advice given to you by your health care provider. Make sure you discuss any questions you have with your health care provider. Document Released: 07/18/2002 Document Revised: 09/11/2015 Document Reviewed: 08/17/2015 Elsevier Interactive Patient Education  [employer].

## 2017-04-06 NOTE — Care Management Note (Signed)
Case Management Note  Patient Details  Name: Suzanne Dickson MRN: [id number] Date of Birth: 22-Apr-1945  Subjective/Objective:    From home with spouse, s/p coronary stent intervention, will be on plavix, patient will need rolling walker, she states ok for [facility] to bring up to room.  Referral made to [facility] with [facility] , he will bring rolling walker up to patient.                Action/Plan: DC home no other needs.  Expected Discharge Date:  04/06/17               Expected Discharge Plan:  Home/Self Care  In-House Referral:     Discharge planning Services  CM Consult  Post Acute Care Choice:  Durable Medical Equipment Choice offered to:  Patient  DME Arranged:  Gilford Rile rolling DME Agency:  [place]:    Section:     Status of Service:  Completed, signed off  If discussed at [place] of Stay Meetings, dates discussed:    Additional Comments:  Zenon Mayo, RN 04/06/2017, 11:09 AM

## 2017-04-06 NOTE — Discharge Summary (Signed)
Discharge summary dictated on 04/06/2017, dictation number is [phone]

## 2017-04-06 NOTE — Discharge Summary (Signed)
NAMELEILENE, DIPRIMA               ACCOUNT NO.:  [id number]  MEDICAL RECORD NO.:  [id number]  LOCATION:  6C05C                        FACILITY:  [facility]  PHYSICIAN:  Praneel Haisley N. Terrence Dupont, M.D. DATE OF BIRTH:  06-16-1945  DATE OF ADMISSION:  04/05/2017 DATE OF DISCHARGE:  04/06/2017                              DISCHARGE SUMMARY   ADMITTING DIAGNOSES: 1. Unstable angina, abnormal nuclear stress test, status post recent     left cardiac catheterization. 2. Coronary artery disease. 3. Hypertension. 4. Hyperlipidemia. 5. Chronic obstructive pulmonary disease. 6. Obstructive sleep apnea. 7. Squamous cell carcinoma of the left lung, status post radiation in     the past. 8. Anemia of chronic disease. 9. Chronic depression.  DISCHARGE DIAGNOSES: 1. Stable angina, status post PTCA stenting to mid and distal RCA. 2. Hypertension. 3. Hyperlipidemia. 4. Chronic obstructive pulmonary disease. 5. Obstructive sleep apnea. 6. Squamous cell carcinoma of the lung, status post radiation in the     past. 7. Anemia of chronic disease. 8. Chronic depression.  DISCHARGE HOME MEDICATIONS: 1. Amlodipine 5 mg 1 tab daily. 2. Aspirin 81 mg 1 tablet daily. 3. Bevespi 2 puffs twice daily as before. 4. Clopidogrel 75 mg 1 tab daily. 5. Diphenhydramine 25 mg at bedtime p.r.n. 6. Ferrous sulfate 325 mg 3 times daily. 7. Mucinex p.r.n. 8. Claritin 10 mg p.r.n. 9. Metoprolol succinate 25 mg daily. 10.Nitrostat sublingual p.r.n. 11.Nitro-Dur patch 0.2 mg/hour daily. 12.O2 by nasal cannula as before. 13.Protonix 40 mg daily. 14.Zoloft 100 mg twice daily. 15.Albuterol inhaler as before. 16.Pepcid 20 mg twice daily as before.  DIET:  Low-salt, low-cholesterol.  INSTRUCTIONS:  Post-cardiac catheterization and stent instructions have been given.  FOLLOWUP:  Follow up with me in 1 week.  CONDITION AT DISCHARGE:  Stable.  BRIEF HISTORY AND HOSPITAL COURSE:  Ms. 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  The patient also complains of exertional dyspnea with minimal exertion.  Denies nausea, vomiting, or diaphoresis.  Denies PND, orthopnea, leg swelling.  The patient had [facility] on January 28, 2017, which showed moderate mid to distal inferior wall ischemia with EF of 58%.  The patient is admitted electively for PCI to RCA.  PHYSICAL EXAMINATION:  GENERAL:  She was alert, awake, oriented x3. VITAL SIGNS:  Blood pressure was 177/75, pulse 76 and regular. EYES:  Conjunctivae were pink. NECK:  Supple.  No JVD.  No bruit. LUNGS:  Clear to auscultation without rhonchi or rales. CARDIOVASCULAR:  S1 and S2 were normal.  There was 2/6 systolic murmur. ABDOMEN:  Soft.  Bowel sounds are present, nontender. EXTREMITIES:  There is no clubbing, cyanosis, or edema. NEURO:  Grossly  intact.  LABORATORY DATA:  Postprocedure today, sodium is 141, potassium 3.5, BUN 17, creatinine 0.92, glucose 98.  Hemoglobin is 9.5, hematocrit 30.6, white count of 6.0.  BRIEF HOSPITAL COURSE:  The patient was a.m. admit and underwent PTCA stenting to RCA as per procedure report.  The patient tolerated the procedure well.  There were no complications.  Postprocedure, the patient did not have any episodes of chest pain during the hospital stay.  Her groin is stable  with no evidence of hematoma or bruit.  The patient is ambulating in room without any problems.  The patient will be discharged home on above medications and will be followed up in my office in 1 week.  The patient also will be scheduled for phase 2 cardiac rehab as outpatient.     Allegra Lai. Terrence Dupont, M.D.     MNH/MEDQ  D:  04/06/2017  T:  04/06/2017  Job:  [id number]

## 2017-04-07 ENCOUNTER — Telehealth (HOSPITAL_COMMUNITY): Payer: Self-pay

## 2017-04-07 NOTE — Telephone Encounter (Signed)
Patients insurance is active and benefits verified through [facility] - $20.00 co-pay, no deductible, out of pocket amount of $4,000/$725.13 has been met, no co-insurance, and no pre-authorization is required. Passport/reference [phone]  Patient will be contacted and scheduled upon review by the RN Navigator.

## 2017-04-17 ENCOUNTER — Other Ambulatory Visit: Payer: Self-pay | Admitting: Internal Medicine

## 2017-04-18 ENCOUNTER — Ambulatory Visit: Payer: Medicare Other | Admitting: Internal Medicine

## 2017-04-18 ENCOUNTER — Encounter: Payer: Self-pay | Admitting: Internal Medicine

## 2017-04-18 VITALS — BP 134/72 | HR 90 | Ht 63.0 in | Wt 148.0 lb

## 2017-04-18 DIAGNOSIS — J328 Other chronic sinusitis: Secondary | ICD-10-CM

## 2017-04-18 DIAGNOSIS — J9611 Chronic respiratory failure with hypoxia: Secondary | ICD-10-CM | POA: Diagnosis not present

## 2017-04-18 DIAGNOSIS — J449 Chronic obstructive pulmonary disease, unspecified: Secondary | ICD-10-CM | POA: Diagnosis not present

## 2017-04-18 MED ORDER — PREDNISONE 10 MG PO TABS: ORAL_TABLET | ORAL | 0 refills | Status: DC

## 2017-04-18 MED ORDER — GLYCOPYRROLATE-FORMOTEROL 9-4.8 MCG/ACT IN AERO: 2.0000 | INHALATION_SPRAY | Freq: Two times a day (BID) | RESPIRATORY_TRACT | 0 refills | Status: DC

## 2017-04-18 NOTE — Progress Notes (Signed)
Subjective:     Patient ID: Suzanne Dickson, female   DOB: 1945-04-04     MRN: [id number]    Brief patient profile:  71 yobf quit smoking on admit 09/02/14  and proved to have GOLD III copd 11/2014       09/22/2015  f/u ov/Nikeia Henkes re: copd III/ symb 1602bid on 2lpm at rest/ 4lpm ex  Chief Complaint  Patient presents with  . Follow-up    [place] had hemoptysis and increased SOB. She took round pred and augmentin and symptoms are some better. She has noticed minimal wheezing at night. She has used neb 3 x over the past 2 wks.   liked bevespi better but wasn't covered then and not doing as well on the symbicort though hfa not ideal  - see a/p rec Plan A = Automatic =stop symbicort and restart Bevespi Take 2 puffs first thing in am and then another 2 puffs about 12 hours later.  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 Follow-up    Breathing is "pretty good".  She states she finished RT. She is using her albuterol inhaler 2 x per wk on average and rarely uses neb.   MMRC2 = can't walk a nl pace on a flat grade s sob but does fine slow and flat eg [employer] shopping on 2lpm rec Plan A = Automatic = Bevespi   Take 2 puffs first thing in am and then another 2 puffs about 12 hours later.  Work on inhaler technique:  Plan B = Backup Only use your  albuterol as a rescue medication Plan C = Crisis - only use your albuterol nebulizer if you first try Plan B    08/17/2016  f/u ov/Keen Ewalt re:  GOLD III copd/ bevespi  Chief Complaint  Patient presents with  . Follow-up    Pt states that her breathing has been worse for the past wk. She has been belching often. She has occ cough and wheezing.  She has been using her proair 3 x per wk on average. She typically does not need neb, but has used x 2 this past wk.   wearing 02 24/7 now / poor hfa and insight into 02 and rescue rx  Cough is dry/ daytime, assoc with worth belching since starting PPI  rec Prednisone 10 mg take  4 each am x  2 days,  2 each am x 2 days,  1 each am x 2 days and stop  Try off protonix and change pepcid to  20 mg after bfast and after supper  02 2lpm 24/7  But ok to adjust up to keep saturations in low 90s if needed  Plan A = Automatic =  Bevespi   Take 2 puffs first thing in am and then another 2 puffs about 12 hours later.  Work on inhaler technique:  Plan B = Backup Only use your albuterol as a rescue medication   Plan C = Crisis - only use your albuterol nebulizer if you first try Plan B    11/17/2016  f/u ov/Maitlyn Penza re: copd GOLD III/ bevesopi  Chief Complaint  Patient presents with  . Follow-up    Breathing is overall doing well. She denies any new co's.    Using saba 3-4 x weekly at most  Doe = MMRC2 = can't walk a nl pace on a flat grade s sob but does fine slow and flat eg shopping at [employer] on 2lpm NP  Sometimes leaves 02 off at home rec Work on inhaler technique   PC  12/24/16  called in Prednisone 10 mg take  4 each am x 2 days,   2 each am x 2 days,  1 each am x 2 days and stop     01/07/2017  f/u ov/Leaman Abe re:  Copd III f/u s/p pred rx  Chief Complaint  Patient presents with  . Follow-up  . Shortness of Breath   p completed pred for flare of nasal congestion/ cough > back to baseline with  doe on 2lpm with acitity/ very slow pacing flat surfaces does  ok = MMRC2  rec Work on inhaler technique    DATE OF ADMISSION:  04/05/2017 DATE OF DISCHARGE:  04/06/2017                            DISCHARGE DIAGNOSES: 1. Stable angina, status post PTCA stenting to mid and distal RCA. 2. Hypertension. 3. Hyperlipidemia. 4. Chronic obstructive pulmonary disease. 5. Obstructive sleep apnea. 6. Squamous cell carcinoma of the lung, status post radiation in the     past. 7. Anemia of chronic disease. 8. Chronic depression.  DISCHARGE HOME MEDICATIONS: 1. Amlodipine 5 mg 1 tab daily. 2. Aspirin 81 mg 1 tablet daily. 3. Bevespi 2 puffs twice daily as before. 4. Clopidogrel 75 mg 1 tab daily. 5. Diphenhydramine 25 mg at bedtime p.r.n. 6. Ferrous sulfate 325 mg 3 times daily. 7. Mucinex p.r.n. 8. Claritin 10 mg p.r.n. 9. Metoprolol succinate 25 mg daily. 10.Nitrostat sublingual p.r.n. 11.Nitro-Dur patch 0.2 mg/hour daily. 12.O2 by nasal cannula as before. 13.Protonix 40 mg daily. 14.Zoloft 100 mg twice daily. 15.Albuterol inhaler as before. 16.Pepcid 20 mg twice daily as before    04/18/2017  Transition of care f/u ov/Tamakia Porto re:  COPD GOLD III spirometry/ 02 dep  Chief Complaint  Patient presents with  . Hospitalization Follow-up    She c/o "allergies" x 2 days- nasal congestion, runny nose, non prod cough.  She uses her albuterol inhaler and neb a couple times per wk.    Dyspnea:  MMRC3 = can't walk 100 yards even at a slow pace at a flat grade s stopping due to sob  Even on 2lpm continuous Cough:  More pnds x 2 days  Sleep: on 2lpm x 10-20 degrees  SABA use:  Rarely now  No obvious day to day or daytime variability or assoc excess/ purulent sputum or mucus plugs or hemoptysis or cp or chest tightness, subjective wheeze or overt sinus or hb symptoms. No unusual exposure hx or h/o childhood pna/ asthma or knowledge of premature birth.  Sleeping ok on 2 pillows and 2lpm  nocturnal  or  early am exacerbation  of respiratory  c/o's or need  for noct saba. Also denies any obvious fluctuation of symptoms with weather or environmental changes or other aggravating or alleviating factors except as outlined above   Current Allergies, Complete Past Medical History, Past Surgical History, Family History, and Social History were reviewed in [employer] [employer] record.  ROS  The following are not active complaints unless bolded Hoarseness, sore throat, dysphagia, dental problems, itching, sneezing,  nasal congestion or discharge of excess mucus or purulent secretions, ear ache,   fever, chills, sweats, unintended wt loss or wt gain, classically pleuritic or exertional cp,  orthopnea pnd or leg swelling, presyncope, palpitations, abdominal pain, anorexia, nausea, vomiting, diarrhea  or change in bowel habits or change in bladder habits, change in stools or change in urine, dysuria, hematuria,  rash, arthralgias, visual complaints, headache, numbness, weakness or ataxia or problems with walking or coordination,  change in mood/affect or memory.        Current Meds  Medication Sig  . albuterol (PROAIR HFA) 108 (90 Base) MCG/ACT inhaler Inhale 2 puffs into the lungs every 4 (four) hours as needed. 2 puffs every 4 hours as needed only  if your can't catch your breath (Patient taking differently: Inhale 2 puffs into the lungs every 4 (four) hours as needed for wheezing or shortness of breath. )  . albuterol (PROVENTIL) (2.5 MG/3ML) 0.083% nebulizer solution Take 3 mLs (2.5 mg total) by nebulization every 4 (four) hours as needed. For shortness of breath (Patient taking differently: Take 2.5 mg by nebulization every 4 (four) hours as needed for wheezing or shortness of breath. )  . amLODipine (NORVASC) 5 MG tablet Take 1 tablet (5 mg total) by mouth daily.  Marland Kitchen aspirin 81 MG tablet Take 81 mg by mouth daily.  Marland Kitchen BEVESPI AEROSPHERE 9-4.8 MCG/ACT AERO inhale 2 puffs twice a day  . clopidogrel (PLAVIX) 75 MG tablet Take 75 mg by mouth daily.  .  diphenhydrAMINE (SOMINEX) 25 MG tablet Take 25 mg by mouth at bedtime as needed for sleep.  . ferrous sulfate 325 (65 FE) MG EC tablet Take 325 mg by mouth 3 (three) times daily with meals.  Marland Kitchen guaiFENesin (MUCINEX) 600 MG 12 hr tablet Take 600 mg by mouth daily as needed (for allergies).   . metoprolol succinate (TOPROL-XL) 25 MG 24 hr tablet Take 25 mg by mouth daily.  . nitroGLYCERIN (NITRODUR - DOSED IN MG/24 HR) 0.2 mg/hr patch Place 0.2 mg onto the skin daily.  . nitroGLYCERIN (NITROSTAT) 0.4 MG SL tablet Place 0.4 mg under the tongue every 5 (five) minutes as needed for chest pain.  . OXYGEN 2lpm with sleep and 4 lpm with exertion  . pantoprazole (PROTONIX) 40 MG tablet TAKE 1 TABLET BY MOUTH ONCE DAILY 30-60 MINS BEFORE FIRST MEAL OF THE DAY  . sertraline (ZOLOFT) 100 MG tablet Take 100 mg by mouth 2 (two) times daily.  . [  loratadine (CLARITIN) 10 MG tablet Take 10 mg by mouth daily as needed for allergies.           Objective:   Physical Exam  amb bf nad on 02  With freq throat clearing   10/28/2014        160 > 12/09/2014 161 > 03/14/2015 158 >    04/25/2015  161 >  06/20/2015  158 > 09/22/2015   159 > 11/19/2015 159 > 12/23/2015   159  > 02/03/2016    163  >  05/18/2016   161 > 11/17/2016   154 > 01/07/2017  157 > 04/18/2017   148     10/10/14 160 lb (72.576 kg)  09/16/14 156 lb (70.761 kg)  09/04/14 160 lb 13.6 oz (72.96 kg)    Vital signs reviewed - Note on arrival 02 sats  98% on 2lpm continous     HEENT: nl   oropharynx. Nl external ear canals without cough reflex - top dentures/ moderate bilateral non-specific turbinate edema     NECK :  without JVD/Nodes/TM/ nl carotid upstrokes bilaterally   LUNGS: no acc muscle use,  Mod barrel  contour chest wall with bilateral  Distant bs s audible wheeze and  without cough on insp or exp maneuver and mod   Hyperresonant  to  percussion bilaterally     CV:  RRR  no s3 or murmur or increase in P2, and no edema   ABD:  soft and  nontender with pos mid insp Hoover's  in the supine position. No bruits or organomegaly appreciated, bowel sounds nl  MS:  Slow steady gait/  ext warm without deformities, calf tenderness, cyanosis or clubbing No obvious joint restrictions   SKIN: warm and dry without lesions    NEURO:  alert, approp, nl sensorium with  no motor or cerebellar deficits apparent.           I personally reviewed images and agree with radiology impression as follows:   Chest CT w contrast 02/22/17  1. The index lesion within the left upper lobe is not significantly changed in size in the interval. There has been progressive increase in surrounding changes of external beam radiation. 2. No specific findings identified to suggest metastatic disease within the chest.       Assessment:

## 2017-04-18 NOTE — Patient Instructions (Addendum)
Pantoprazole (protonix) 40 mg  Take  30-60 min before first meal of the day and Pepcid (famotidine)  20 mg one @  bedtime until all respiratory symptoms are better x one week then ok to take as needed   Prednisone 10 mg take  4 each am x 2 days,   2 each am x 2 days,  1 each am x 2 days and stop   As needed for drippy nose > zyrtec 10 mg over the counter - take it in evening or bedtime if makes you sleepy   Please schedule a follow up visit in 3 months but call sooner if needed

## 2017-04-18 NOTE — Assessment & Plan Note (Signed)
Quit smoking 08/2014 - PFT's  06/20/2015  FEV1 0.75 (44 % ) ratio 62  p no % improvement from saba p ? prior to study with DLCO  24 % corrects to 48 % for alv volume    - 12/09/2014 referred to rehab > started 12/2014  - 04/25/2015   try BEVESPI  - 06/20/2015  Changed by insurance> back on symb 160  - 09/22/2015 not happy with symbicort > changed back to besvespi 2bid   - 04/18/2017  After extensive coaching inhaler device  effectiveness =  75% (still short Ti)    Pt is Group B in terms of symptom/risk and laba/lama therefore appropriate rx at this point so bevespi still a good choice  Reviewed: Formulary restrictions will be an ongoing challenge for the forseable future and I would be happy to pick an alternative if the pt will first  provide me a list of them but pt  will need to return here for training for any new device that is required eg dpi vs hfa vs respimat.    In meantime we can always provide samples so the patient never runs out of any needed respiratory medications.    Each maintenance medication was reviewed in detail including most importantly the difference between maintenance and as needed and under what circumstances the prns are to be used.  Please see AVS for specific  Instructions which are unique to this visit and I personally typed out  which were reviewed in detail in writing with the patient and a copy provided.

## 2017-04-18 NOTE — Assessment & Plan Note (Addendum)
Sinus CT 10/31/2014>  No sinusitis is seen.     Mild flare > rec Prednisone 10 mg take  4 each am x 2 days,   2 each am x 2 days,  1 each am x 2 days and stop and change clariton to zyrtec

## 2017-04-18 NOTE — Assessment & Plan Note (Addendum)
Started on 02 2lpm at d/c 09/04/14  - 10/28/2014   Walked RA x one lap @ 185 stopped due to  desat to 82%  at nl pace so rec 02 2lpm with walking and sleeping  - 12/09/2014 referred for POC > did not qualify - 08/17/2016 Patient Saturations on Room Air at Rest = 85%---increased to 93% on 2lpm o2  - 04/18/2017  Walking across exam room sats dropped into 80's on 2lpm > rec 3lpm with walking and ordered amb 02 titration

## 2017-04-27 ENCOUNTER — Telehealth (HOSPITAL_COMMUNITY): Payer: Self-pay

## 2017-04-27 NOTE — Telephone Encounter (Signed)
Called and spoke with patient in regards to Cardiac Rehab - Patient stated she has things going on right now and doing things for herself so right now she is not interested. Closed referral.

## 2017-05-09 ENCOUNTER — Telehealth: Payer: Self-pay | Admitting: Internal Medicine

## 2017-05-09 DIAGNOSIS — J449 Chronic obstructive pulmonary disease, unspecified: Secondary | ICD-10-CM

## 2017-05-09 MED ORDER — ALBUTEROL SULFATE HFA 108 (90 BASE) MCG/ACT IN AERS: 2.0000 | INHALATION_SPRAY | RESPIRATORY_TRACT | 5 refills | Status: DC | PRN

## 2017-05-09 MED ORDER — GLYCOPYRROLATE-FORMOTEROL 9-4.8 MCG/ACT IN AERO: 2.0000 | INHALATION_SPRAY | Freq: Two times a day (BID) | RESPIRATORY_TRACT | 5 refills | Status: DC

## 2017-05-09 NOTE — Telephone Encounter (Signed)
Spoke with pt. She is needing refills on Proair and Bevespi. Both prescriptions have been sent in. Nothing further was needed at this time.

## 2017-05-11 ENCOUNTER — Other Ambulatory Visit: Payer: Self-pay | Admitting: Internal Medicine

## 2017-05-11 DIAGNOSIS — J449 Chronic obstructive pulmonary disease, unspecified: Secondary | ICD-10-CM

## 2017-05-12 ENCOUNTER — Telehealth: Payer: Self-pay | Admitting: Internal Medicine

## 2017-05-12 NOTE — Telephone Encounter (Signed)
Called and spoke with patient, she states that when she called the pharmacy yesterday they told her that a refill had not been sent. I advised patient that medication was sent this morning at 9:30 and the pharmacy was verified. Nothing further needed.

## 2017-07-19 ENCOUNTER — Encounter: Payer: Self-pay | Admitting: Internal Medicine

## 2017-07-19 ENCOUNTER — Ambulatory Visit: Payer: Medicare Other | Admitting: Internal Medicine

## 2017-07-19 VITALS — BP 154/72 | HR 80 | Ht 63.0 in | Wt 145.2 lb

## 2017-07-19 DIAGNOSIS — J449 Chronic obstructive pulmonary disease, unspecified: Secondary | ICD-10-CM | POA: Diagnosis not present

## 2017-07-19 DIAGNOSIS — J9611 Chronic respiratory failure with hypoxia: Secondary | ICD-10-CM | POA: Diagnosis not present

## 2017-07-19 DIAGNOSIS — J328 Other chronic sinusitis: Secondary | ICD-10-CM | POA: Diagnosis not present

## 2017-07-19 NOTE — Patient Instructions (Addendum)
Work on inhaler technique:  relax and gently blow all the way out then take a nice smooth deep breath back in, triggering the inhaler at same time you start breathing in.  Hold for up to 5 seconds if you can. Blow out thru nose. Rinse and gargle with water when done  No change in medications but resume zyrtec as needed for nasal symptoms    Please schedule a follow up visit in 3 months but call sooner if needed - bring your drug formulary in case we need to pick an alternative to [facility]

## 2017-07-19 NOTE — Progress Notes (Signed)
Subjective:     Patient ID: Suzanne Dickson, female   DOB: [birth year]     MRN: [id number]    Brief patient profile:  50 yobf quit smoking on admit 09/02/14  and proved to have GOLD III copd 11/2014       09/22/2015  f/u ov/Kamyrah Feeser re: copd III/ symb 1602bid on 2lpm at rest/ 4lpm ex  Chief Complaint  Patient presents with  . Follow-up    [place] had hemoptysis and increased SOB. She took round pred and augmentin and symptoms are some better. She has noticed minimal wheezing at night. She has used neb 3 x over the past 2 wks.   liked bevespi better but wasn't covered then and not doing as well on the symbicort though hfa not ideal  - see a/p rec Plan A = Automatic =stop symbicort and restart Bevespi Take 2 puffs first thing in am and then another 2 puffs about 12 hours later.  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 Follow-up    Breathing is "pretty good".  She states she finished RT. She is using her albuterol inhaler 2 x per wk on average and rarely uses neb.   MMRC2 = can't walk a nl pace on a flat grade s sob but does fine slow and flat eg [employer] shopping on 2lpm rec Plan A = Automatic = Bevespi   Take 2 puffs first thing in am and then another 2 puffs about 12 hours later.  Work on inhaler technique:  Plan B = Backup Only use your  albuterol as a rescue medication Plan C = Crisis - only use your albuterol nebulizer if you first try Plan B    08/17/2016  f/u ov/Kolby Myung re:  GOLD III copd/ bevespi  Chief Complaint  Patient presents with  . Follow-up    Pt states that her breathing has been worse for the past wk. She has been belching often. She has occ cough and wheezing.  She has been using her proair 3 x per wk on average. She typically does not need neb, but has used x 2 this past wk.   wearing 02 24/7 now / poor hfa and insight into 02 and rescue rx  Cough is dry/ daytime, assoc with worth belching since starting PPI  rec Prednisone 10 mg take  4 each am x  2 days,  2 each am x 2 days,  1 each am x 2 days and stop  Try off protonix and change pepcid to  20 mg after bfast and after supper  02 2lpm 24/7  But ok to adjust up to keep saturations in low 90s if needed  Plan A = Automatic =  Bevespi   Take 2 puffs first thing in am and then another 2 puffs about 12 hours later.  Work on inhaler technique:  Plan B = Backup Only use your albuterol as a rescue medication   Plan C = Crisis - only use your albuterol nebulizer if you first try Plan B    11/17/2016  f/u ov/Durga Saldarriaga re: copd GOLD III/ bevesopi  Chief Complaint  Patient presents with  . Follow-up    Breathing is overall doing well. She denies any new co's.    Using saba 3-4 x weekly at most  Doe = MMRC2 = can't walk a nl pace on a flat grade s sob but does fine slow and flat eg shopping at [employer] on 2lpm NP  Sometimes leaves 02 off at home rec Work on inhaler technique   PC  12/24/16  called in Prednisone 10 mg take  4 each am x 2 days,   2 each am x 2 days,  1 each am x 2 days and stop     01/07/2017  f/u ov/Ansley Stanwood re:  Copd III f/u s/p pred rx  Chief Complaint  Patient presents with  . Follow-up  . Shortness of Breath   p completed pred for flare of nasal congestion/ cough > back to baseline with  doe on 2lpm with acitity/ very slow pacing flat surfaces does  ok = MMRC2  rec Work on inhaler technique    DATE OF ADMISSION:  04/05/2017 DATE OF DISCHARGE:  04/06/2017                            DISCHARGE DIAGNOSES: 1. Stable angina, status post PTCA stenting to mid and distal RCA. 2. Hypertension. 3. Hyperlipidemia. 4. Chronic obstructive pulmonary disease. 5. Obstructive sleep apnea. 6. Squamous cell carcinoma of the lung, status post radiation in the     past. 7. Anemia of chronic disease. 8. Chronic depression.  DISCHARGE HOME MEDICATIONS: 1. Amlodipine 5 mg 1 tab daily. 2. Aspirin 81 mg 1 tablet daily. 3. Bevespi 2 puffs twice daily as before. 4. Clopidogrel 75 mg 1 tab daily. 5. Diphenhydramine 25 mg at bedtime p.r.n. 6. Ferrous sulfate 325 mg 3 times daily. 7. Mucinex p.r.n. 8. Claritin 10 mg p.r.n. 9. Metoprolol succinate 25 mg daily. 10.Nitrostat sublingual p.r.n. 11.Nitro-Dur patch 0.2 mg/hour daily. 12.O2 by nasal cannula as before. 13.Protonix 40 mg daily. 14.Zoloft 100 mg twice daily. 15.Albuterol inhaler as before. 16.Pepcid 20 mg twice daily as before    04/18/2017  Transition of care f/u ov/Mykenzie Ebanks re:  COPD GOLD III spirometry/ 02 dep  Chief Complaint  Patient presents with  . Hospitalization Follow-up    She c/o "allergies" x 2 days- nasal congestion, runny nose, non prod cough.  She uses her albuterol inhaler and neb a couple times per wk.    Dyspnea:  MMRC3 = can't walk 100 yards even at a slow pace at a flat grade s stopping due to sob  Even on 2lpm continuous Cough:  More pnds x 2 days  Sleep: on 2lpm x 10-20 degrees  SABA use:  Rarely now rec Pantoprazole (protonix) 40 mg  Take  30-60 min before first meal of the day and Pepcid (famotidine)  20 mg one @  bedtime until all respiratory symptoms are better x one week then ok to take as needed  Prednisone 10 mg take  4 each am x 2 days,   2 each am  x 2 days,  1 each am x 2 days and stop  As needed for drippy nose > zyrtec 10 mg over the counter - take it in  evening or bedtime if makes you sleep   07/19/2017  f/u ov/Louie Flenner re: copd III spirometry/ 02 dep 2lpm  Chief Complaint  Patient presents with  . Follow-up    She c/o runny nose, watery eyes and sneezing in the am's over the past month. She uses her albuterol inhaler 2-3 x per wk on average and she rarely uses neb.  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   Sleeping: 2lpm 20 degrees  without nocturnal  or early am exacerbation  of respiratory  c/o's or need for noct saba. Also denies any obvious fluctuation of symptoms with weather or environmental changes or other aggravating or alleviating factors except as outlined above   No unusual exposure hx or h/o childhood pna/ asthma or knowledge of premature birth.  Current Allergies, Complete Past Medical History, Past Surgical History, Family History, and Social History were reviewed in [employer] record.  ROS  The following are not active complaints unless bolded Hoarseness, sore throat, dysphagia, dental problems, itching, sneezing,  nasal congestion or discharge of excess mucus or purulent secretions, ear ache,   fever, chills, sweats, unintended wt loss or wt gain, classically pleuritic or exertional cp,  orthopnea pnd or arm/hand swelling  or leg swelling, presyncope, palpitations, abdominal pain, anorexia, nausea, vomiting, diarrhea  or change in bowel habits or change in bladder habits, change in stools or change in urine, dysuria, hematuria,  rash, arthralgias, visual complaints, headache, numbness, weakness or ataxia or problems with walking or coordination,  change in mood or  memory.        Current Meds  Medication Sig  . albuterol  (PROAIR HFA) 108 (90 Base) MCG/ACT inhaler Inhale 2 puffs into the lungs every 4 (four) hours as needed for wheezing or shortness of breath.  Marland Kitchen albuterol (PROVENTIL) (2.5 MG/3ML) 0.083% nebulizer solution Take 3 mLs (2.5 mg total) by nebulization every 4 (four) hours as needed. For shortness of breath (Patient taking differently: Take 2.5 mg by nebulization every 4 (four) hours as needed for wheezing or shortness of breath. )  . amLODipine (NORVASC) 5 MG tablet Take 1 tablet (5 mg total) by mouth daily.  Marland Kitchen aspirin 81 MG tablet Take 81 mg by mouth daily.  Marland Kitchen BEVESPI AEROSPHERE 9-4.8 MCG/ACT AERO INHALE 2 PUFFS BY MOUTH TWICE A DAY  . clopidogrel (PLAVIX) 75 MG tablet Take 75 mg by mouth daily.  . diphenhydrAMINE (SOMINEX) 25 MG tablet Take 25 mg by mouth at bedtime as needed for sleep.  . ferrous sulfate 325 (65 FE) MG EC tablet Take 325 mg by mouth 3 (three) times daily with meals.  Marland Kitchen guaiFENesin (MUCINEX) 600 MG 12 hr tablet Take 600 mg by mouth daily as needed (for allergies).   . losartan (COZAAR) 25 MG tablet Take 25 mg by mouth daily.  . metoprolol succinate (TOPROL-XL) 25 MG 24 hr tablet Take 25 mg by mouth daily.  . nitroGLYCERIN (NITRODUR - DOSED IN MG/24 HR) 0.2 mg/hr patch Place 0.2 mg onto  the skin daily.  . nitroGLYCERIN (NITROSTAT) 0.4 MG SL tablet Place 0.4 mg under the tongue every 5 (five) minutes as needed for chest pain.  . OXYGEN 2lpm with sleep and 4 lpm with exertion  . pantoprazole (PROTONIX) 40 MG tablet TAKE 1 TABLET BY MOUTH ONCE DAILY 30-60 MINS BEFORE FIRST MEAL OF THE DAY  . sertraline (ZOLOFT) 100 MG tablet Take 100 mg by mouth 2 (two) times daily.                 Objective:   Physical Exam  amb bf nad   10/28/2014        160 > 12/09/2014 161 > 03/14/2015 158 >    04/25/2015  161 >  06/20/2015  158 > 09/22/2015   159 > 11/19/2015 159 > 12/23/2015   159  > 02/03/2016    163  > 05/18/2016   161 > 11/17/2016   154 > 01/07/2017  157 > 04/18/2017   148 > 07/19/2017  145      10/10/14 160 lb (72.576 kg)  09/16/14 156 lb (70.761 kg)  09/04/14 160 lb 13.6 oz (72.96 kg)     Vital signs reviewed - Note on arrival 02 sats  94% on 2lpm continuous and bp 154/72 noted after resting     HEENT: nl   oropharynx. Nl external ear canals without cough reflex - moderate bilateral non-specific turbinate edema  /full dentures    NECK :  without JVD/Nodes/TM/ nl carotid upstrokes bilaterally   LUNGS: no acc muscle use,  Mod barrel  contour chest wall with bilateral  Distant bs s audible wheeze and  without cough on insp or exp maneuver and mod   Hyperresonant  to  percussion bilaterally     CV:  RRR  no s3 or murmur or increase in P2, and no edema   ABD:  soft and nontender with pos mid insp Hoover's  in the supine position. No bruits or organomegaly appreciated, bowel sounds nl  MS:   Nl gait/  ext warm without deformities, calf tenderness, cyanosis or clubbing No obvious joint restrictions   SKIN: warm and dry without lesions    NEURO:  alert, approp, nl sensorium with  no motor or cerebellar deficits apparent.               Assessment:

## 2017-07-19 NOTE — Assessment & Plan Note (Signed)
Quit smoking 08/2014 - PFT's  06/20/2015  FEV1 0.75 (44 % ) ratio 62  p no % improvement from saba p ? prior to study with DLCO  24 % corrects to 48 % for alv volume    - 12/09/2014 referred to rehab > started 12/2014  - 04/25/2015   try BEVESPI  - 06/20/2015  Changed by insurance> back on symb 160  - 09/22/2015 not happy with symbicort > changed back to besvespi 2bid   07/19/2017  After extensive coaching inhaler device  effectiveness =    50% (way too fast)     Her use of hfa is very marginal and may need to consider other options including dpi or smi but not sure of drug fourmular so asked her to keep working on hfa and return with the formulary   I had an extended discussion with the patient reviewing all relevant studies completed to date and  lasting 15 to 20 minutes of a 25 minute visit    See device teaching which extended face to face time for this visit.  Each maintenance medication was reviewed in detail including emphasizing most importantly the difference between maintenance and prns and under what circumstances the prns are to be triggered using an action plan format that is not reflected in the computer generated alphabetically organized AVS which I have not found useful in most complex patients, especially with respiratory illnesses  Please see AVS for specific instructions unique to this visit that I personally wrote and verbalized to the the pt in detail and then reviewed with pt  by my nurse highlighting any  changes in therapy recommended at today's visit to their plan of care.

## 2017-07-19 NOTE — Assessment & Plan Note (Signed)
Started on 02 2lpm at d/c 09/04/14  - 10/28/2014   Walked RA x one lap @ 185 stopped due to  desat to 82%  at nl pace so rec 02 2lpm with walking and sleeping  - 12/09/2014 referred for POC > did not qualify - 08/17/2016 Patient Saturations on Room Air at Rest = 85%---increased to 93% on 2lpm o2 - 04/18/2017  Walking across exam room sats dropped into 80's on 2lpm    Advised may need to increase 02 with activity but ok at rest/ sleep on 2lpm

## 2017-07-19 NOTE — Assessment & Plan Note (Signed)
Sinus CT 10/31/2014>  No sinusitis is seen.   Restart prn zyrtec

## 2017-08-19 ENCOUNTER — Inpatient Hospital Stay: Payer: Medicare Other | Attending: Internal Medicine

## 2017-08-19 ENCOUNTER — Ambulatory Visit (HOSPITAL_COMMUNITY)
Admission: RE | Admit: 2017-08-19 | Discharge: 2017-08-19 | Disposition: A | Discharge status: Home / Self Care | Payer: Medicare Other | Source: Ambulatory Visit | Attending: Internal Medicine | Admitting: Internal Medicine

## 2017-08-19 DIAGNOSIS — C3412 Malignant neoplasm of upper lobe, left bronchus or lung: Secondary | ICD-10-CM | POA: Diagnosis not present

## 2017-08-19 DIAGNOSIS — C3490 Malignant neoplasm of unspecified part of unspecified bronchus or lung: Secondary | ICD-10-CM | POA: Diagnosis not present

## 2017-08-19 DIAGNOSIS — Z79899 Other long term (current) drug therapy: Secondary | ICD-10-CM | POA: Insufficient documentation

## 2017-08-19 DIAGNOSIS — Z7982 Long term (current) use of aspirin: Secondary | ICD-10-CM | POA: Diagnosis not present

## 2017-08-19 DIAGNOSIS — R530 Neoplastic (malignant) related fatigue: Secondary | ICD-10-CM | POA: Insufficient documentation

## 2017-08-19 DIAGNOSIS — N200 Calculus of kidney: Secondary | ICD-10-CM | POA: Diagnosis not present

## 2017-08-19 DIAGNOSIS — Z9981 Dependence on supplemental oxygen: Secondary | ICD-10-CM | POA: Diagnosis not present

## 2017-08-19 DIAGNOSIS — R918 Other nonspecific abnormal finding of lung field: Secondary | ICD-10-CM | POA: Insufficient documentation

## 2017-08-19 DIAGNOSIS — R0602 Shortness of breath: Secondary | ICD-10-CM | POA: Diagnosis not present

## 2017-08-19 DIAGNOSIS — K449 Diaphragmatic hernia without obstruction or gangrene: Secondary | ICD-10-CM | POA: Diagnosis not present

## 2017-08-19 DIAGNOSIS — I10 Essential (primary) hypertension: Secondary | ICD-10-CM | POA: Insufficient documentation

## 2017-08-19 DIAGNOSIS — Z923 Personal history of irradiation: Secondary | ICD-10-CM | POA: Insufficient documentation

## 2017-08-19 DIAGNOSIS — I700 Atherosclerosis of aorta: Secondary | ICD-10-CM | POA: Insufficient documentation

## 2017-08-19 DIAGNOSIS — J439 Emphysema, unspecified: Secondary | ICD-10-CM | POA: Diagnosis not present

## 2017-08-19 DIAGNOSIS — I2510 Atherosclerotic heart disease of native coronary artery without angina pectoris: Secondary | ICD-10-CM | POA: Insufficient documentation

## 2017-08-19 DIAGNOSIS — J449 Chronic obstructive pulmonary disease, unspecified: Secondary | ICD-10-CM | POA: Diagnosis not present

## 2017-08-19 LAB — CBC WITH DIFFERENTIAL (CANCER CENTER ONLY)
Basophils Absolute: 0 10*3/uL (ref 0.0–0.1)
Basophils Relative: 0 %
Eosinophils Absolute: 0.2 10*3/uL (ref 0.0–0.5)
Eosinophils Relative: 5 %
HEMATOCRIT: 32.7 % — AB (ref 34.8–46.6)
HEMOGLOBIN: 10 g/dL — AB (ref 11.6–15.9)
Lymphocytes Relative: 30 %
Lymphs Abs: 1.4 10*3/uL (ref 0.9–3.3)
MCH: 28.2 pg (ref 25.1–34.0)
MCHC: 30.6 g/dL — ABNORMAL LOW (ref 31.5–36.0)
MCV: 92.1 fL (ref 79.5–101.0)
Monocytes Absolute: 0.5 10*3/uL (ref 0.1–0.9)
Monocytes Relative: 10 %
NEUTROS ABS: 2.7 10*3/uL (ref 1.5–6.5)
Neutrophils Relative %: 55 %
Platelet Count: 177 10*3/uL (ref 145–400)
RBC: 3.55 MIL/uL — ABNORMAL LOW (ref 3.70–5.45)
RDW: 13 % (ref 11.2–14.5)
WBC Count: 4.8 10*3/uL (ref 3.9–10.3)

## 2017-08-19 LAB — CMP (CANCER CENTER ONLY)
ALBUMIN: 4.1 g/dL (ref 3.5–5.0)
ALK PHOS: 76 U/L (ref 38–126)
ALT: 20 U/L (ref 0–44)
AST: 24 U/L (ref 15–41)
Anion gap: 8 (ref 5–15)
BILIRUBIN TOTAL: 0.3 mg/dL (ref 0.3–1.2)
BUN: 24 mg/dL — AB (ref 8–23)
CO2: 25 mmol/L (ref 22–32)
Calcium: 9.3 mg/dL (ref 8.9–10.3)
Chloride: 109 mmol/L (ref 98–111)
Creatinine: 1.17 mg/dL — ABNORMAL HIGH (ref 0.44–1.00)
GFR, Est AFR Am: 53 mL/min — ABNORMAL LOW (ref >60)
GFR, Estimated: 46 mL/min — ABNORMAL LOW (ref >60)
Glucose, Bld: 105 mg/dL — ABNORMAL HIGH (ref 70–99)
POTASSIUM: 3.8 mmol/L (ref 3.5–5.1)
SODIUM: 142 mmol/L (ref 135–145)
TOTAL PROTEIN: 7.4 g/dL (ref 6.5–8.1)

## 2017-08-22 ENCOUNTER — Ambulatory Visit: Payer: Self-pay | Admitting: Radiation Oncology

## 2017-08-22 ENCOUNTER — Encounter: Payer: Self-pay | Admitting: Internal Medicine

## 2017-08-22 ENCOUNTER — Inpatient Hospital Stay (HOSPITAL_BASED_OUTPATIENT_CLINIC_OR_DEPARTMENT_OTHER): Payer: Medicare Other | Admitting: Internal Medicine

## 2017-08-22 ENCOUNTER — Inpatient Hospital Stay
Admission: RE | Admit: 2017-08-22 | Discharge: 2017-08-22 | Disposition: A | Discharge status: Home / Self Care | Payer: Medicare Other | Source: Ambulatory Visit | Attending: Radiation Oncology | Admitting: Radiation Oncology

## 2017-08-22 ENCOUNTER — Telehealth: Payer: Self-pay | Admitting: Internal Medicine

## 2017-08-22 VITALS — BP 155/64 | HR 82 | Temp 98.3°F | Resp 18 | Ht 63.0 in | Wt 146.0 lb

## 2017-08-22 DIAGNOSIS — Z79899 Other long term (current) drug therapy: Secondary | ICD-10-CM

## 2017-08-22 DIAGNOSIS — R530 Neoplastic (malignant) related fatigue: Secondary | ICD-10-CM

## 2017-08-22 DIAGNOSIS — J449 Chronic obstructive pulmonary disease, unspecified: Secondary | ICD-10-CM

## 2017-08-22 DIAGNOSIS — Z923 Personal history of irradiation: Secondary | ICD-10-CM

## 2017-08-22 DIAGNOSIS — R0602 Shortness of breath: Secondary | ICD-10-CM | POA: Diagnosis not present

## 2017-08-22 DIAGNOSIS — Z9981 Dependence on supplemental oxygen: Secondary | ICD-10-CM

## 2017-08-22 DIAGNOSIS — C3412 Malignant neoplasm of upper lobe, left bronchus or lung: Secondary | ICD-10-CM | POA: Diagnosis not present

## 2017-08-22 DIAGNOSIS — I10 Essential (primary) hypertension: Secondary | ICD-10-CM

## 2017-08-22 DIAGNOSIS — C3490 Malignant neoplasm of unspecified part of unspecified bronchus or lung: Secondary | ICD-10-CM

## 2017-08-22 DIAGNOSIS — Z7982 Long term (current) use of aspirin: Secondary | ICD-10-CM

## 2017-08-22 NOTE — Telephone Encounter (Signed)
Scheduled appt per 7/29 los - gave patient aVS and calender per los. - central radiology to contact patient with ct scan.

## 2017-08-22 NOTE — Progress Notes (Signed)
[facility] [phone]   [phone]  OFFICE PROGRESS NOTE  [name], [place] [phone] [place] [address]  DIAGNOSIS: Stage IA (T1a, N0, M0) non-small cell lung cancer, adenocarcinoma presented with left upper lobe lung nodule diagnosed in March 2018.  PRIOR THERAPY: S/P RT to the left lung nodule under the care of Dr. Sondra Come completed on 05/04/2016.  CURRENT THERAPY: Observation.  INTERVAL HISTORY: Suzanne Dickson 72 y.o. female returns to the clinic today for follow-up visit accompanied by her husband.  The patient has no complaints today except for the baseline shortness of breath and she is currently on home oxygen.  She came sitting in a wheelchair.  She denied having any chest pain, cough or hemoptysis.  She denied having any fever or chills.  She has no nausea, vomiting, diarrhea or constipation.  She denied having any recent weight loss or night sweats.  She had repeat CT scan of the chest performed recently and she is here for evaluation and discussion of her risk her results.   MEDICAL HISTORY: Past Medical History:  Diagnosis Date  . Adenocarcinoma of left lung, stage 1 ([facility]) 04/15/2016   "I'm in remission" (04/05/2017)  . Anxiety   . Arthritis    "legs, elbows" (04/05/2017)  . Bronchitis, chronic ([facility])    "haven't had it in awhile" (04/05/2017)  . CHF (congestive heart failure) ([facility])   . COPD (chronic obstructive pulmonary disease) ([facility])   . Daily headache    "recently" (04/05/2017)  . Depression   . Dyspnea    with exertion  . GERD (gastroesophageal reflux disease)   . History of blood transfusion 1950s  . History of radiation therapy 05/04/16 - 05/12/16   Left lung treated to 54 Gy with 3 fx of 18 Gy  . Hypertension   . On home oxygen therapy    "2L; 24/7" (04/05/2017)  . OSA on CPAP   . Pneumonia 10/2015   "at least 3 times" (04/05/2017)    ALLERGIES:  has No Known Allergies.  MEDICATIONS:    Current Outpatient Medications  Medication Sig Dispense Refill  . albuterol (PROAIR HFA) 108 (90 Base) MCG/ACT inhaler Inhale 2 puffs into the lungs every 4 (four) hours as needed for wheezing or shortness of breath. 1 Inhaler 5  . albuterol (PROVENTIL) (2.5 MG/3ML) 0.083% nebulizer solution Take 3 mLs (2.5 mg total) by nebulization every 4 (four) hours as needed. For shortness of breath (Patient taking differently: Take 2.5 mg by nebulization every 4 (four) hours as needed for wheezing or shortness of breath. ) 75 mL 12  . amLODipine (NORVASC) 5 MG tablet Take 1 tablet (5 mg total) by mouth daily. 30 tablet 3  . aspirin 81 MG tablet Take 81 mg by mouth daily.    Marland Kitchen BEVESPI AEROSPHERE 9-4.8 MCG/ACT AERO INHALE 2 PUFFS BY MOUTH TWICE A DAY 10.7 g 11  . clopidogrel (PLAVIX) 75 MG tablet Take 75 mg by mouth daily.    . diphenhydrAMINE (SOMINEX) 25 MG tablet Take 25 mg by mouth at bedtime as needed for sleep.    . ferrous sulfate 325 (65 FE) MG EC tablet Take 325 mg by mouth 3 (three) times daily with meals.    Marland Kitchen guaiFENesin (MUCINEX) 600 MG 12 hr tablet Take 600 mg by mouth daily as needed (for allergies).     . losartan (COZAAR) 25 MG tablet Take 25 mg by mouth daily.    Marland Kitchen  metoprolol succinate (TOPROL-XL) 25 MG 24 hr tablet Take 25 mg by mouth daily.    . nitroGLYCERIN (NITRODUR - DOSED IN MG/24 HR) 0.2 mg/hr patch Place 0.2 mg onto the skin daily.    . nitroGLYCERIN (NITROSTAT) 0.4 MG SL tablet Place 0.4 mg under the tongue every 5 (five) minutes as needed for chest pain.    . OXYGEN 2lpm with sleep and 4 lpm with exertion    . pantoprazole (PROTONIX) 40 MG tablet TAKE 1 TABLET BY MOUTH ONCE DAILY 30-60 MINS BEFORE FIRST MEAL OF THE DAY 30 tablet 0  . sertraline (ZOLOFT) 100 MG tablet Take 100 mg by mouth 2 (two) times daily.  0  . EQ ASPIRIN ADULT LOW DOSE 81 MG EC tablet Take 81 mg by mouth daily.  3  . nitroGLYCERIN (NITRODUR - DOSED IN MG/24 HR) 0.4 mg/hr patch 0.4 mg.  3   No current  facility-administered medications for this visit.     SURGICAL HISTORY:  Past Surgical History:  Procedure Laterality Date  . COLONOSCOPY    . CORONARY ANGIOPLASTY WITH STENT PLACEMENT  04/05/2017  . CORONARY STENT INTERVENTION N/A 04/05/2017   Procedure: CORONARY STENT INTERVENTION;  Surgeon: [name], MD;  Location: [facility];  Service: Cardiovascular;  Laterality: N/A;  . DILATION AND CURETTAGE OF UTERUS    . FUDUCIAL PLACEMENT Left 03/31/2016   Procedure: PLACEMENT OF FUDUCIAL LEFT UPPER LOBE;  Surgeon: Collene Gobble, MD;  Location: [facility];  Service: Thoracic;  Laterality: Left;  . LAPAROSCOPIC CHOLECYSTECTOMY    . LEFT HEART CATH AND CORONARY ANGIOGRAPHY N/A 02/17/2017   Procedure: LEFT HEART CATH AND CORONARY ANGIOGRAPHY;  Surgeon: [name], MD;  Location: [facility];  Service: Cardiovascular;  Laterality: N/A;  . SINUSOTOMY    . TONSILLECTOMY    . TUBAL LIGATION    . VIDEO BRONCHOSCOPY WITH ENDOBRONCHIAL NAVIGATION N/A 03/31/2016   Procedure: VIDEO BRONCHOSCOPY WITH ENDOBRONCHIAL NAVIGATION;  Surgeon: Collene Gobble, MD;  Location: [facility];  Service: Thoracic;  Laterality: N/A;    REVIEW OF SYSTEMS:  A comprehensive review of systems was negative except for: Constitutional: positive for fatigue Respiratory: positive for dyspnea on exertion   PHYSICAL EXAMINATION: General appearance: alert, cooperative, fatigued and no distress Head: Normocephalic, without obvious abnormality, atraumatic Neck: no adenopathy, no JVD, supple, symmetrical, trachea midline and thyroid not enlarged, symmetric, no tenderness/mass/nodules Lymph nodes: Cervical, supraclavicular, and axillary nodes normal. Resp: clear to auscultation bilaterally Back: symmetric, no curvature. ROM normal. No CVA tenderness. Cardio: regular rate and rhythm, S1, S2 normal, no murmur, click, rub or gallop GI: soft, non-tender; bowel sounds normal; no masses,  no organomegaly Extremities: extremities  normal, atraumatic, no cyanosis or edema  ECOG PERFORMANCE STATUS: 1 - Symptomatic but completely ambulatory  Blood pressure (!) 155/64, pulse 82, temperature 98.3 F (36.8 C), temperature source Oral, resp. rate 18, height 5\' 3"  (1.6 m), weight 146 lb (66.2 kg), SpO2 94 %.  LABORATORY DATA: Lab Results  Component Value Date   WBC 4.8 08/19/2017   HGB 10.0 (L) 08/19/2017   HCT 32.7 (L) 08/19/2017   MCV 92.1 08/19/2017   PLT 177 08/19/2017      Chemistry      Component Value Date/Time   NA 142 08/19/2017 1309   NA 141 08/13/2016 1120   K 3.8 08/19/2017 1309   K 3.4 (L) 08/13/2016 1120   CL 109 08/19/2017 1309   CO2 25 08/19/2017 1309   CO2 25  08/13/2016 1120   BUN 24 (H) 08/19/2017 1309   BUN 15.8 08/13/2016 1120   CREATININE 1.17 (H) 08/19/2017 1309   CREATININE 1.1 08/13/2016 1120      Component Value Date/Time   CALCIUM 9.3 08/19/2017 1309   CALCIUM 9.1 08/13/2016 1120   ALKPHOS 76 08/19/2017 1309   ALKPHOS 68 08/13/2016 1120   AST 24 08/19/2017 1309   AST 20 08/13/2016 1120   ALT 20 08/19/2017 1309   ALT 14 08/13/2016 1120   BILITOT 0.3 08/19/2017 1309   BILITOT 0.47 08/13/2016 1120       RADIOGRAPHIC STUDIES: Ct Chest Wo Contrast  Result Date: 08/19/2017 CLINICAL DATA:  Left upper lobe lung adenocarcinoma, stage IA, status post radiation therapy completed 05/04/2016. Patient presents for restaging on interval observation. EXAM: CT CHEST WITHOUT CONTRAST TECHNIQUE: Multidetector CT imaging of the chest was performed following the standard protocol without IV contrast. COMPARISON:  02/22/2017 chest CT. FINDINGS: Cardiovascular: Normal heart size. No significant pericardial effusion/thickening. Three-vessel coronary atherosclerosis. Atherosclerotic nonaneurysmal thoracic aorta. Normal caliber pulmonary arteries. Mediastinum/Nodes: Stable mildly heterogeneous thyroid gland without discrete nodules. Unremarkable esophagus. No pathologically enlarged axillary,  mediastinal or hilar lymph nodes, noting limited sensitivity for the detection of hilar adenopathy on this noncontrast study. Lungs/Pleura: No pneumothorax. No pleural effusion. Severe centrilobular emphysema with mild diffuse bronchial wall thickening. Sharply marginated bandlike left perihilar radiation fibrosis, decreased in thickness. Scattered fiducial markers in the left upper lobe, unchanged. Previously described 1.7 x 1.2 cm posterior left upper lobe pulmonary nodule measures 1.3 x 1.1 cm (series 7/image 57), decreased. There is a new irregular 0.7 cm nodular opacity superior to this nodule in the posterior left upper lobe (series 7/image 53). No acute consolidative airspace disease or additional new significant pulmonary nodules. Upper abdomen: Small hiatal hernia. Cholecystectomy. Simple 4.4 cm upper right renal cyst, partially visualized. Nonobstructing punctate upper left renal stones. Musculoskeletal: No aggressive appearing focal osseous lesions. Mild thoracic spondylosis. IMPRESSION: 1. Treated posterior left upper lobe pulmonary nodule is slightly decreased. 2. New irregular 0.7 cm nodular opacity just superior to the treated left upper lobe pulmonary nodule, indeterminate, recommend attention on follow-up chest CT in 3-6 months. 3. Evolving mild radiation fibrosis in the parahilar left lung. 4. No adenopathy or other findings of metastatic disease in the chest. 5. Three-vessel coronary atherosclerosis. 6. Small hiatal hernia. 7. Nonobstructing upper left nephrolithiasis. Aortic Atherosclerosis (ICD10-I70.0) and Emphysema (ICD10-J43.9). Electronically Signed   By: Ilona Sorrel M.D.   On: 08/19/2017 16:58    ASSESSMENT AND PLAN: This is a very pleasant 72 years old white female with a stage IA non-small cell lung cancer, adenocarcinoma presented with left upper lobe lung nodule. The patient is status post stereotactic radiotherapy to the left upper lobe lung nodule and she tolerated the procedure  well. The patient is feeling fine today with no concerning complaints except for her baseline shortness of breath secondary to COPD and cardiac condition. She had a repeat CT scan of the chest performed recently.  I personally and independently reviewed the scan images and discussed the results with the patient and her husband and showed them the images.  Her scan showed no concerning findings for disease progression except for new irregular 0.7 cm nodular opacity superior to the treated area. I recommended for the patient to continue on observation with close monitoring of the new nodule on the upcoming scan. I will see her back for follow-up visit in 6 months with repeat CT scan of the chest.  For COPD and cardiac condition the patient will continue her current follow-up visit with Dr. Melvyn Novas and Dr. Terrence Dupont. She was advised to call immediately if she has any concerning symptoms in the interval. The patient voices understanding of current disease status and treatment options and is in agreement with the current care plan. All questions were answered. The patient knows to call the clinic with any problems, questions or concerns. We can certainly see the patient much sooner if necessary. I spent 10 minutes counseling the patient face to face. The total time spent in the appointment was 15 minutes.  Disclaimer: This note was dictated with voice recognition software. Similar sounding words can inadvertently be transcribed and may not be corrected upon review.

## 2017-09-01 ENCOUNTER — Telehealth: Payer: Self-pay | Admitting: Internal Medicine

## 2017-09-01 MED ORDER — PREDNISONE 10 MG PO TABS: ORAL_TABLET | ORAL | 0 refills | Status: DC

## 2017-09-01 NOTE — Telephone Encounter (Signed)
Pt aware of recs.  pred taper sent to pharmacy.  Pt unable to schedule a rov today d/t transportation- states she will call back to schedule.  Nothing further needed at this time.

## 2017-09-01 NOTE — Telephone Encounter (Signed)
Called and spoke with patient regarding not feeling well  Pt reports post nasal drip, eyes watering, sinus pressure, productive cough-clear/yellow, SOB with exertion, and wheezing with congestion for last 2-3 weeks. Pt not willing to come in this week due to transportation issues. Patient has tried OTC Zytek, using Proair QD, and not have used her nebulizer at this time. Pt denies fever or chills, chest pain or tightness Pt requesting something to be called in if possible MW please advise.

## 2017-09-01 NOTE — Telephone Encounter (Signed)
Prednisone 10 mg take  4 each am x 2 days,   2 each am x 2 days,  1 each am x 2 days and stop and ov in 2 weeks and move f/u ov with me to 4 weeks with all meds in hand

## 2017-09-19 ENCOUNTER — Other Ambulatory Visit: Payer: Self-pay | Admitting: Internal Medicine

## 2017-10-19 ENCOUNTER — Other Ambulatory Visit (INDEPENDENT_AMBULATORY_CARE_PROVIDER_SITE_OTHER): Payer: Medicare Other

## 2017-10-19 ENCOUNTER — Other Ambulatory Visit: Payer: Self-pay | Admitting: Internal Medicine

## 2017-10-19 ENCOUNTER — Ambulatory Visit (INDEPENDENT_AMBULATORY_CARE_PROVIDER_SITE_OTHER)
Admission: RE | Admit: 2017-10-19 | Discharge: 2017-10-19 | Disposition: A | Discharge status: Home / Self Care | Payer: Medicare Other | Source: Ambulatory Visit | Attending: Internal Medicine | Admitting: Internal Medicine

## 2017-10-19 ENCOUNTER — Encounter: Payer: Self-pay | Admitting: Internal Medicine

## 2017-10-19 ENCOUNTER — Ambulatory Visit: Payer: Medicare Other | Admitting: Internal Medicine

## 2017-10-19 VITALS — BP 144/64 | HR 90 | Temp 97.5°F | Ht 63.0 in | Wt 150.0 lb

## 2017-10-19 DIAGNOSIS — J328 Other chronic sinusitis: Secondary | ICD-10-CM

## 2017-10-19 DIAGNOSIS — J9611 Chronic respiratory failure with hypoxia: Secondary | ICD-10-CM

## 2017-10-19 DIAGNOSIS — J449 Chronic obstructive pulmonary disease, unspecified: Secondary | ICD-10-CM | POA: Diagnosis not present

## 2017-10-19 DIAGNOSIS — Z23 Encounter for immunization: Secondary | ICD-10-CM

## 2017-10-19 LAB — CBC WITH DIFFERENTIAL/PLATELET
BASOS PCT: 0.4 % (ref 0.0–3.0)
Basophils Absolute: 0 10*3/uL (ref 0.0–0.1)
Eosinophils Absolute: 0.1 10*3/uL (ref 0.0–0.7)
Eosinophils Relative: 2.5 % (ref 0.0–5.0)
HEMATOCRIT: 30.1 % — AB (ref 36.0–46.0)
HEMOGLOBIN: 9.9 g/dL — AB (ref 12.0–15.0)
LYMPHS PCT: 20.5 % (ref 12.0–46.0)
Lymphs Abs: 1.2 10*3/uL (ref 0.7–4.0)
MCHC: 32.8 g/dL (ref 30.0–36.0)
MCV: 90.5 fl (ref 78.0–100.0)
Monocytes Absolute: 0.5 10*3/uL (ref 0.1–1.0)
Monocytes Relative: 8.3 % (ref 3.0–12.0)
NEUTROS PCT: 68.3 % (ref 43.0–77.0)
Neutro Abs: 4.1 10*3/uL (ref 1.4–7.7)
Platelets: 213 10*3/uL (ref 150.0–400.0)
RBC: 3.32 Mil/uL — ABNORMAL LOW (ref 3.87–5.11)
RDW: 14.1 % (ref 11.5–15.5)
WBC: 6.1 10*3/uL (ref 4.0–10.5)

## 2017-10-19 MED ORDER — PREDNISONE 10 MG PO TABS: ORAL_TABLET | ORAL | 0 refills | Status: DC

## 2017-10-19 MED ORDER — BUDESONIDE-FORMOTEROL FUMARATE 160-4.5 MCG/ACT IN AERO: 2.0000 | INHALATION_SPRAY | Freq: Two times a day (BID) | RESPIRATORY_TRACT | 0 refills | Status: DC

## 2017-10-19 MED ORDER — BUDESONIDE-FORMOTEROL FUMARATE 160-4.5 MCG/ACT IN AERO: 2.0000 | INHALATION_SPRAY | Freq: Two times a day (BID) | RESPIRATORY_TRACT | 11 refills | Status: DC

## 2017-10-19 NOTE — Patient Instructions (Addendum)
Prednisone 10 mg take  4 each am x 2 days,   2 each am x 2 days,  1 each am x 2 days and stop   Plan A = Automatic = symbicort 160 Take 2 puffs first thing in am and then another 2 puffs about 12 hours later.     Plan B = Backup Only use your albuterol as a rescue medication to be used if you can't catch your breath by resting or doing a relaxed purse lip breathing pattern.  - The less you use it, the better it will work when you need it. - Ok to use the inhaler up to 2 puffs  every 4 hours if you must but call for appointment if use goes up over your usual need - Don't leave home without it !!  (think of it like the spare tire for your car)   Plan C = Crisis - only use your albuterol nebulizer if you first try Plan B and it fails to help > ok to use the nebulizer up to every 4 hours but if start needing it regularly call for immediate appointment  Goal for 02 is to keep saturation over 90%   Please remember to go to the lab and x-ray department downstairs in the basement  for your tests - we will call you with the results when they are available.     Please schedule a follow up office visit in 4 weeks, sooner if needed  with all medications /inhalers/ solutions in hand so we can verify exactly what you are taking. This includes all medications from all doctors and over the counters

## 2017-10-19 NOTE — Progress Notes (Signed)
Subjective:     Patient ID: Suzanne Dickson, female   DOB: November 14, 1945     MRN: [id number]    Brief patient profile:  31 yobf quit smoking on admit 09/02/14  and proved to have GOLD III copd 11/2014      History of Present Illness  09/22/2015  f/u ov/Suzanne Dickson re: copd III/ symb 1602bid on 2lpm at rest/ 4lpm ex  Chief Complaint  Patient presents with  . Follow-up    [place] had hemoptysis and increased SOB. She took round pred and augmentin and symptoms are some better. She has noticed minimal wheezing at night. She has used neb 3 x over the past 2 wks.   liked bevespi better but wasn't covered then and not doing as well on the symbicort though hfa not ideal  - see a/p rec Plan A = Automatic =stop symbicort and restart Bevespi Take 2 puffs first thing in am and then another 2 puffs about 12 hours later.  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 > referred to Dr Lamonte Sakai 02/24/2016 >>>  FOB tbbx 03/31/16 Pos Adenoca > RT     05/18/2016  f/u ov/Suzanne Dickson re: gold III copd/ 02 dep/ bevespi 2bid  - tolerated RT well completed  05/12/16  Chief Complaint  Patient presents with  . Follow-up    Breathing is "pretty good".  She states she finished RT. She is using her albuterol inhaler 2 x per wk on average and rarely uses neb.   MMRC2 = can't walk a nl pace on a flat grade s sob but does fine slow and flat eg [employer] shopping on 2lpm rec Plan A = Automatic = Bevespi   Take 2 puffs first thing in am and then another 2 puffs about 12 hours later.  Work on inhaler technique:  Plan B =  Backup Only use your albuterol as a rescue medication Plan C = Crisis - only use your albuterol nebulizer if you first try Plan B     PC  12/24/16  called in Prednisone 10 mg take  4 each am x 2 days,   2 each am x 2 days,  1 each am x 2 days and stop         DATE OF ADMISSION:  04/05/2017 DATE OF DISCHARGE:  04/06/2017 DISCHARGE DIAGNOSES: 1. Stable angina, status post PTCA stenting to mid and distal RCA. 2. Hypertension. 3. Hyperlipidemia. 4. Chronic obstructive pulmonary disease. 5. Obstructive sleep apnea. 6. Squamous cell carcinoma of the lung, status post radiation in the     past. 7. Anemia of chronic disease. 8. Chronic depression.  aily as before    04/18/2017  Transition of care f/u ov/Suzanne Dickson re:  COPD GOLD III spirometry/ 02 dep  Chief Complaint  Patient presents with  . Hospitalization Follow-up    She c/o "allergies" x 2 days- nasal congestion, runny nose, non prod cough.  She uses her albuterol inhaler and neb a couple times per wk.  Dyspnea:  MMRC3 = can't walk 100 yards even at a slow pace at a flat grade s stopping due to sob  Even on 2lpm continuous Cough:  More pnds x 2 days  Sleep: on 2lpm x 10-20 degrees  SABA use:  Rarely now rec Pantoprazole (protonix) 40 mg  Take  30-60 min before first meal of the day and Pepcid (famotidine)  20 mg one @  bedtime until all respiratory symptoms are better x one week then ok to take as needed  Prednisone 10 mg take  4 each am x 2 days,   2 each am x 2 days,  1 each am x 2 days and stop  As needed for drippy nose > zyrtec 10 mg over the counter - take it in evening or bedtime if makes you sleep      10/19/2017  Acute extended  ov/Suzanne Dickson re:  COPD III/ 02 dep 2lpm / [facility] for review Chief Complaint  Patient presents with  . Acute Visit    Breathing has been worse x 2 wks. She has been producing some yellow sputum over the past 2 days. She states also wheezing. She has been using her albuterol inhaler 3  x per day and she has not used neb.   Dyspnea:  MMRC3 = baseline can't walk 100 yards even at a slow pace at a flat grade s stopping due to sob on 2lpm but sometimes on RA now worse 2 weeks assoc with watery rhinitis no better with zyrtec  Cough: more productive x 2 weeks assoc with pnds but min yellow  Sleeping: 2lpm  Bed flat and 2 big pillows  SABA use: not usually needing but now tid  02: 2lpm baseline now up to 3lpm    No obvious day to day or daytime variability or assoc excess/ purulent sputum or mucus plugs or hemoptysis or cp or chest tightness, subjective wheeze or overt sinus or hb symptoms.   Sleeping as above  without nocturnal  or early am exacerbation  of respiratory  c/o's or need for noct saba. Also denies any obvious fluctuation of symptoms with weather or environmental changes or other aggravating or alleviating factors except as outlined above   No unusual exposure hx or h/o childhood pna/ asthma or knowledge of premature birth.  Current Allergies, Complete Past Medical History, Past Surgical History, Family History, and Social History were reviewed in [employer] record.  ROS  The following are not active complaints unless bolded Hoarseness, sore throat, dysphagia, dental problems, itching, sneezing,  nasal congestion or discharge of excess mucus or purulent secretions, ear ache,   fever, chills, sweats, unintended wt loss or wt gain, classically pleuritic or exertional cp,  orthopnea pnd or arm/hand swelling  or leg swelling, presyncope, palpitations, abdominal pain, anorexia, nausea, vomiting, diarrhea  or change in bowel habits or change in bladder habits, change in stools or change in urine, dysuria, hematuria,  rash, arthralgias, visual complaints, headache, numbness, weakness or ataxia or problems with walking or coordination,  change in mood or  memory.        Current Meds  Medication Sig  . albuterol (PROAIR HFA) 108 (90 Base) MCG/ACT inhaler  Inhale 2 puffs into the lungs every 4 (four) hours as needed for wheezing or shortness of breath.  Marland Kitchen albuterol (PROVENTIL) (2.5 MG/3ML) 0.083% nebulizer solution Take 3 mLs (2.5 mg total) by nebulization every 4 (four) hours as needed. For shortness of breath (Patient taking differently: Take  2.5 mg by nebulization every 4 (four) hours as needed for wheezing or shortness of breath. )  . amLODipine (NORVASC) 5 MG tablet Take 1 tablet (5 mg total) by mouth daily.  Marland Kitchen aspirin 81 MG tablet Take 81 mg by mouth daily.  . clopidogrel (PLAVIX) 75 MG tablet Take 75 mg by mouth daily.  . diphenhydrAMINE (SOMINEX) 25 MG tablet Take 25 mg by mouth at bedtime as needed for sleep.  Noelle Penner ASPIRIN ADULT LOW DOSE 81 MG EC tablet Take 81 mg by mouth daily.  . famotidine (PEPCID) 20 MG tablet TAKE 1 TABLET BY MOUTH TWICE DAILY  . ferrous sulfate 325 (65 FE) MG EC tablet Take 325 mg by mouth 3 (three) times daily with meals.  Marland Kitchen guaiFENesin (MUCINEX) 600 MG 12 hr tablet Take 600 mg by mouth daily as needed (for allergies).   . losartan (COZAAR) 25 MG tablet Take 25 mg by mouth daily.  . metoprolol succinate (TOPROL-XL) 25 MG 24 hr tablet Take 25 mg by mouth daily.  . nitroGLYCERIN (NITRODUR - DOSED IN MG/24 HR) 0.4 mg/hr patch 0.4 mg.  . nitroGLYCERIN (NITROSTAT) 0.4 MG SL tablet Place 0.4 mg under the tongue every 5 (five) minutes as needed for chest pain.  . OXYGEN 2lpm with sleep and 4 lpm with exertion  . pantoprazole (PROTONIX) 40 MG tablet TAKE 1 TABLET BY MOUTH ONCE DAILY 30-60 MINS BEFORE FIRST MEAL OF THE DAY  . sertraline (ZOLOFT) 100 MG tablet Take 100 mg by mouth 2 (two) times daily.  . [ BEVESPI AEROSPHERE 9-4.8 MCG/ACT AERO INHALE 2 PUFFS BY MOUTH TWICE A DAY             Objective:   Physical Exam  amb bf nad   10/28/2014        160 > 12/09/2014 161 > 03/14/2015 158 >    04/25/2015  161 >  06/20/2015  158 > 09/22/2015   159 > 11/19/2015 159 > 12/23/2015   159  > 02/03/2016    163  > 05/18/2016   161 >  11/17/2016   154 > 01/07/2017  157 > 04/18/2017   148 > 07/19/2017  145 > 10/19/2017    150     10/10/14 160 lb (72.576 kg)  09/16/14 156 lb (70.761 kg)  09/04/14 160 lb 13.6 oz (72.96 kg)      Vital signs reviewed - Note on arrival 02 sats  91% on  3lpm continuous      HEENT: nl   oropharynx. Nl external ear canals without cough reflex -  Mod bilateral non-specific turbinate edema full dentures    NECK :  without JVD/Nodes/TM/ nl carotid upstrokes bilaterally   LUNGS: no acc muscle use,  Mild barrel  contour chest wall with bilateral  Distant bs s audible wheeze and  without cough on insp or exp maneuver and mild  Hyperresonant  to  percussion bilaterally     CV:  RRR  no s3 or murmur or increase in P2, and no edema   ABD:  soft and nontender with pos late  insp Hoover's  in the supine position. No bruits or organomegaly appreciated, bowel sounds nl  MS:   Nl gait/  ext warm without deformities, calf tenderness, cyanosis or clubbing No obvious joint restrictions   SKIN: warm and dry without lesions    NEURO:  alert, approp, nl sensorium with  no motor or cerebellar deficits apparent.  CXR PA and Lateral:   10/19/2017 :    I personally reviewed images and   impression as follows:   Bilateral scarring L >R/ no acute changes    Labs ordered 10/19/2017  Allergy profile     Assessment:

## 2017-10-20 ENCOUNTER — Encounter: Payer: Self-pay | Admitting: Internal Medicine

## 2017-10-20 LAB — RESPIRATORY ALLERGY PROFILE REGION II ~~LOC~~
Allergen, D pternoyssinus,d7: <0.1 kU/L
Allergen, Mouse Urine Protein, e78: <0.1 kU/L
Allergen, Oak,t7: <0.1 kU/L
Bermuda Grass: <0.1 kU/L
Box Elder IgE: <0.1 kU/L
CLASS: 0
CLASS: 0
CLASS: 0
CLASS: 0
CLASS: 0
CLASS: 0
CLASS: 0
CLASS: 0
CLASS: 0
Class: 0
Class: 0
Class: 0
Class: 0
Class: 0
Class: 0
Class: 0
Class: 0
Class: 0
Class: 0
Class: 0
Class: 0
Class: 0
Class: 0
Class: 0
Cockroach: <0.1 kU/L
Elm IgE: <0.1 kU/L
IGE (IMMUNOGLOBULIN E), SERUM: 67 kU/L (ref <=114)
Sheep Sorrel IgE: <0.1 kU/L
Timothy Grass: <0.1 kU/L

## 2017-10-20 LAB — INTERPRETATION:

## 2017-10-20 NOTE — Progress Notes (Signed)
Spoke with pt and notified of results per Dr. Wert. Pt verbalized understanding and denied any questions. 

## 2017-10-20 NOTE — Assessment & Plan Note (Signed)
Sinus CT 10/31/2014>  No sinusitis is seen  - Allergy profile 10/19/2017 >  Eos 0.1 /  IgE    rx prednisone x 6 days / continue zyrtec pending allergy eval ? Trial of singulair / nasal steroids next  Emphasized in meantime to blow symbicort out thru nose

## 2017-10-20 NOTE — Assessment & Plan Note (Addendum)
Quit smoking 08/2014 - PFT's  06/20/2015  FEV1 0.75 (44 % ) ratio 62  p no % improvement from saba p ? prior to study with DLCO  24 % corrects to 48 % for alv volume    - 12/09/2014 referred to rehab > started 12/2014  - 04/25/2015   try BEVESPI  - 06/20/2015  Changed by insurance> back on symb 160  - 09/22/2015 not happy with symbicort > changed back to besvespi 2bid > not happy with bevespi > change back to symb 160 2bid and consider adding spiriva next vs trelegy trial    10/19/2017  After extensive coaching inhaler device,  effectiveness =    75% (short Ti limiting)   She has a lot of upper airway symptoms that may be non-acid gerd or irritation from inhalers so using symbicort here may back fire but will try this first then add spiriva smi if not better while rx acutely with pred x 6 days and send for allergy profile - see rhinitis a/p  Advised:  Formulary restrictions will be an ongoing challenge for the forseable future and I would be happy to pick an alternative if the pt will first  provide me a list of them but pt  will need to return here for training for any new device that is required eg dpi vs hfa vs respimat.    In meantime we can always provide samples so the patient never runs out of any needed respiratory medications.   I had an extended discussion with the patient reviewing all relevant studies completed to date and  lasting 25 minutes of a 40  minute acute office visit addressing severe non-specific but potentially very serious refractory respiratory symptoms of uncertain and potentially multiple  etiologies.  See device teaching which extended face to face time for this visit   Each maintenance medication was reviewed in detail including most importantly the difference between maintenance and prns and under what circumstances the prns are to be triggered using an action plan format that is not reflected in the computer generated alphabetically organized AVS.    Please see AVS  for specific instructions unique to this office visit that I personally wrote and verbalized to the the pt in detail and then reviewed with pt  by my nurse highlighting any changes in therapy/plan of care  recommended at today's visit.

## 2017-10-20 NOTE — Assessment & Plan Note (Signed)
Started on 02 2lpm at d/c 09/04/14  - 10/28/2014   Walked RA x one lap @ 185 stopped due to  desat to 82%  at nl pace so rec 02 2lpm with walking and sleeping  - 12/09/2014 referred for POC > did not qualify - 08/17/2016 Patient Saturations on Room Air at Rest = 85%---increased to 93% on 2lpm o2 - 04/18/2017  Walking across exam room sats dropped into 80's on 2lpm   02 requirements have increased, advised to monitor and adjust upward with goal of keeping > 90% but should call if on max rx and not able to achieve that goal

## 2017-11-16 ENCOUNTER — Encounter: Payer: Self-pay | Admitting: Internal Medicine

## 2017-11-16 ENCOUNTER — Ambulatory Visit: Payer: Medicare Other | Admitting: Internal Medicine

## 2017-11-16 VITALS — BP 164/70 | HR 77 | Ht 63.0 in | Wt 150.6 lb

## 2017-11-16 DIAGNOSIS — J449 Chronic obstructive pulmonary disease, unspecified: Secondary | ICD-10-CM

## 2017-11-16 DIAGNOSIS — J328 Other chronic sinusitis: Secondary | ICD-10-CM

## 2017-11-16 DIAGNOSIS — J9611 Chronic respiratory failure with hypoxia: Secondary | ICD-10-CM

## 2017-11-16 DIAGNOSIS — R0982 Postnasal drip: Secondary | ICD-10-CM | POA: Diagnosis not present

## 2017-11-16 MED ORDER — PREDNISONE 10 MG PO TABS: 10.0000 mg | ORAL_TABLET | Freq: Every day | ORAL | 0 refills | Status: DC

## 2017-11-16 NOTE — Progress Notes (Signed)
Subjective:     Patient ID: Suzanne Dickson, female   DOB: [birth year]     MRN: [id number]    Brief patient profile:  66 yobf quit smoking on admit 09/02/14  and proved to have GOLD III copd 11/2014     History of Present Illness  09/22/2015  f/u ov/Gionni Freese re: copd III/ symb 1602bid on 2lpm at rest/ 4lpm ex  Chief Complaint  Patient presents with  . Follow-up    [place] had hemoptysis and increased SOB. She took round pred and augmentin and symptoms are some better. She has noticed minimal wheezing at night. She has used neb 3 x over the past 2 wks.   liked bevespi better but wasn't covered then and not doing as well on the symbicort though hfa not ideal  - see a/p rec Plan A = Automatic =stop symbicort and restart Bevespi Take 2 puffs first thing in am and then another 2 puffs about 12 hours later.  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 Acute Visit    Breathing has been worse x 2 wks. She has been producing some yellow sputum over the past 2 days. She states also wheezing. She has been using her albuterol inhaler 3 x per day and she has not used neb.   Dyspnea:  MMRC3 = baseline can't walk 100 yards even at a slow pace at a flat grade s stopping due to sob on 2lpm but sometimes on RA now worse 2 weeks assoc with watery rhinitis no better with zyrtec  Cough: more  productive x 2 weeks assoc with pnds but min yellow  Sleeping: 2lpm  Bed flat and 2 big pillows  SABA use: not usually needing but now tid  02: 2lpm baseline now up to 3lpm  rec Prednisone 10 mg take  4 each am x 2 days,   2 each am x 2 days,  1 each am x 2 days and stop  Plan A = Automatic = symbicort 160 Take 2 puffs first thing in am and then another 2 puffs about 12 hours later.  Plan B = Backup Only use your albuterol as a rescue medication Plan C = Crisis - only use your albuterol nebulizer if you first try Plan B and it fails to help > ok to use the nebulizer up to every 4 hours but if start needing it regularly call for immediate appointment Goal for 02 is to keep saturation over 90%  Please schedule a follow up office visit in 4 weeks, sooner if needed  with all medications /inhalers/ solutions in hand so we can verify exactly what you are taking. This includes all medications from all doctors and over the counters    11/16/2017  f/u  ov/Clancey Welton re: copd II criteria but 02 dep/ did not bring all meds as req  Chief Complaint  Patient presents with  . Follow-up    Breathing better but back quite back to baseline. She still has rhinitis and PND. She is using her albuterol inhaler 8-10 x per wk.  She rarely uses her neb.   Dyspnea: .MMRC3 = can't walk 100 yards even at a slow pace at a flat grade s stopping due to sob  On RA or  3lpm  - not monitoring sats as rec  Cough: none  Sleeping: ok 1pillow / bed flat SABA use: as above  02: 2lpm hs / prn daytime    No obvious day to day or daytime variability or assoc excess/ purulent sputum or mucus plugs or hemoptysis or cp or chest tightness, subjective wheeze or overt sinus or hb symptoms.   Sleeping  without nocturnal  or early am exacerbation  of respiratory  c/o's or need for noct saba. Also denies any obvious fluctuation of symptoms with weather or environmental changes or other aggravating or alleviating factors except as outlined  above   No unusual exposure hx or h/o childhood pna/ asthma or knowledge of premature birth.  Current Allergies, Complete Past Medical History, Past Surgical History, Family History, and Social History were reviewed in [employer] record.  ROS  The following are not active complaints unless bolded Hoarseness, sore throat, dysphagia, dental problems, itching, sneezing,  nasal congestion or discharge of excess mucus or purulent secretions, ear ache,   fever, chills, sweats, unintended wt loss or wt gain, classically pleuritic or exertional cp,  orthopnea pnd or arm/hand swelling  or leg swelling, presyncope, palpitations, abdominal pain, anorexia, nausea, vomiting, diarrhea  or change in bowel habits or change in bladder habits, change in stools or change in urine, dysuria, hematuria,  rash, arthralgias, visual complaints, headache, numbness, weakness or ataxia or problems with walking or coordination,  change in mood or  memory.       Did not bring meds as req - - NOTE:   Unable to verify as accurately reflecting what pt takes    Current Meds  Medication Sig  . albuterol (PROAIR HFA) 108 (90 Base) MCG/ACT inhaler Inhale 2 puffs into the lungs every 4 (four) hours as needed for wheezing or shortness of breath.  Marland Kitchen albuterol (PROVENTIL) (2.5 MG/3ML) 0.083% nebulizer solution Take 3 mLs (2.5 mg total) by nebulization every 4 (four) hours as needed. For shortness of breath (Patient taking differently: Take 2.5 mg by nebulization every 4 (four) hours as needed for wheezing or shortness of breath. )  . amLODipine (NORVASC) 5 MG tablet Take 1 tablet (5 mg total) by mouth daily.  Marland Kitchen aspirin 81 MG tablet Take 81 mg by mouth daily.  . budesonide-formoterol (SYMBICORT) 160-4.5 MCG/ACT inhaler Inhale 2 puffs into the lungs 2 (two) times daily.  . clopidogrel (PLAVIX) 75 MG tablet Take 75 mg by mouth daily.  . diphenhydrAMINE (SOMINEX) 25 MG tablet Take 25 mg by mouth at bedtime as needed  for sleep.  Noelle Penner ASPIRIN ADULT LOW DOSE 81 MG EC tablet Take 81 mg by mouth daily.  . famotidine (PEPCID) 20 MG tablet Take 20 mg by mouth at bedtime.  . ferrous sulfate 325 (65 FE) MG EC tablet Take 325 mg by mouth 3 (three) times daily with meals.  Marland Kitchen guaiFENesin (MUCINEX) 600 MG 12 hr tablet Take 600 mg by mouth daily as needed (for allergies).   Marland Kitchen  losartan (COZAAR) 25 MG tablet Take 25 mg by mouth daily.  . metoprolol succinate (TOPROL-XL) 25 MG 24 hr tablet Take 25 mg by mouth daily.  . nitroGLYCERIN (NITRODUR - DOSED IN MG/24 HR) 0.4 mg/hr patch 0.4 mg.  . nitroGLYCERIN (NITROSTAT) 0.4 MG SL tablet Place 0.4 mg under the tongue every 5 (five) minutes as needed for chest pain.  . OXYGEN 2lpm with sleep and 4 lpm with exertion  . pantoprazole (PROTONIX) 40 MG tablet TAKE 1 TABLET BY MOUTH ONCE DAILY 30-60 MINS BEFORE FIRST MEAL OF THE DAY  . sertraline (ZOLOFT) 100 MG tablet Take 100 mg by mouth 2 (two) times daily.               Objective:   Physical Exam  amb bf nad   10/28/2014        160 > 12/09/2014 161 > 03/14/2015 158 >    04/25/2015  161 >  06/20/2015  158 > 09/22/2015   159 > 11/19/2015 159 > 12/23/2015   159  > 02/03/2016    163  > 05/18/2016   161 > 11/17/2016   154 > 01/07/2017  157 > 04/18/2017   148 > 07/19/2017  145 > 10/19/2017    150  > 11/16/2017   150     10/10/14 160 lb (72.576 kg)  09/16/14 156 lb (70.761 kg)  09/04/14 160 lb 13.6 oz (72.96 kg)       Vital signs reviewed - Note on arrival 02 sats  93% on 2lpm pulsed   And  after sitting was 91% RA    HEENT: nl dentition / oropharynx. Nl external ear canals without cough reflex -  Moderate  bilateral non-specific turbinate edema     NECK :  without JVD/Nodes/TM/ nl carotid upstrokes bilaterally   LUNGS: no acc muscle use,  Mild barrel  contour chest wall with bilateral  Distant bs s audible wheeze and  without cough on insp or exp maneuver and mild  Hyperresonant  to  percussion bilaterally     CV:  RRR  no s3  or murmur or increase in P2, and no edema   ABD:  soft and nontender with pos late  insp Hoover's  in the supine position. No bruits or organomegaly appreciated, bowel sounds nl  MS:   Nl gait/  ext warm without deformities, calf tenderness, cyanosis or clubbing No obvious joint restrictions   SKIN: warm and dry without lesions    NEURO:  alert, approp, nl sensorium with  no motor or cerebellar deficits apparent.         Assessment:

## 2017-11-16 NOTE — Patient Instructions (Addendum)
Prednisone 10 mg take  4 each am x 2 days,   2 each am x 2 days,  1 each am x 2 days and stop    Please see patient coordinator before you leave today  to schedule sinus CT and ent follow up if needed with Dr Lucia Gaskins   Please schedule a follow up visit in 3 months but call sooner if needed  with all medications /inhalers/ solutions in hand so we can verify exactly what you are taking. This includes all medications from all doctors and over the counters - add  No 02 needed at rest, max 02 when walking more than room to room at home to prevent desats

## 2017-11-18 ENCOUNTER — Encounter: Payer: Self-pay | Admitting: Internal Medicine

## 2017-11-18 NOTE — Assessment & Plan Note (Addendum)
Sinus CT 10/31/2014>  No sinusitis is seen  - Allergy profile 10/19/2017 >  Eos 0.1 /  IgE 67  RAST neg   -  Repeat Sinus CT ordered p pred x 6 days to se what difference  if any she notes but will hold abx in absence of more evidence of any infection at this point    I had an extended discussion with the patient reviewing all relevant studies completed to date and  lasting 15 to 20 minutes of a 25 minute visit     See device teaching which extended face to face time for this visit   Each maintenance medication was reviewed in detail including most importantly the difference between maintenance and prns and under what circumstances the prns are to be triggered using an action plan format that is not reflected in the computer generated alphabetically organized AVS.     Please see AVS for specific instructions unique to this visit that I personally wrote and verbalized to the the pt in detail and then reviewed with pt  by my nurse highlighting any  changes in therapy recommended at today's visit to their plan of care.

## 2017-11-18 NOTE — Assessment & Plan Note (Addendum)
Quit smoking 08/2014 - PFT's  06/20/2015  FEV1 0.75 (44 % ) ratio 62  p no % improvement from saba p ? prior to study with DLCO  24 % corrects to 48 % for alv volume    - 12/09/2014 referred to rehab > started 12/2014  - 04/25/2015   try BEVESPI  - 06/20/2015  Changed by insurance> back on symb 160  - 09/22/2015 not happy with symbicort > changed back to besvespi 2bid > not happy with bevespi > change back to symb 160 2bid and consider adding spiriva next vs trelegy trial    11/16/2017  After extensive coaching inhaler device,  effectiveness =    75% (short Ti)    Still struggling with instructions/ med and 02 adherence but no recent severe flares or need for er on present rx so will rx rhinitis (see sep a/p) with short course of prednisone but no change in rx for copd - good candidate for triple rx hfa when available to add lama back on here.

## 2017-11-18 NOTE — Assessment & Plan Note (Signed)
Started on 02 2lpm at d/c 09/04/14  - 10/28/2014   Walked RA x one lap @ 185 stopped due to  desat to 82%  at nl pace so rec 02 2lpm with walking and sleeping  - 12/09/2014 referred for POC > did not qualify - 08/17/2016 Patient Saturations on Room Air at Rest = 85%---increased to 93% on 2lpm o2 - 04/18/2017  Walking across exam room sats dropped into 80's on 2lpm    - 11/16/2017   Walked RA x one lap @ 185 stopped due to 87% and even on 5lpm could not maintain sats at nl pace   Ok therefore to leave 02 off at rest but turn up to max when active and pace slower - correcting rhinitis may help 02 delivery as well / advised

## 2017-11-30 ENCOUNTER — Ambulatory Visit (INDEPENDENT_AMBULATORY_CARE_PROVIDER_SITE_OTHER)
Admission: RE | Admit: 2017-11-30 | Discharge: 2017-11-30 | Disposition: A | Discharge status: Home / Self Care | Payer: Medicare Other | Source: Ambulatory Visit | Attending: Internal Medicine | Admitting: Internal Medicine

## 2017-11-30 DIAGNOSIS — J328 Other chronic sinusitis: Secondary | ICD-10-CM

## 2017-11-30 DIAGNOSIS — R0982 Postnasal drip: Secondary | ICD-10-CM

## 2017-12-01 ENCOUNTER — Telehealth: Payer: Self-pay | Admitting: Internal Medicine

## 2017-12-01 DIAGNOSIS — J328 Other chronic sinusitis: Secondary | ICD-10-CM

## 2017-12-01 NOTE — Telephone Encounter (Signed)
Called and spoke with Patient.  Explained that Dr. Melvyn Novas wanted to refer her to ENT, Dr Victorio Palm group.  Patient stated understanding.  Order placed.  Nothing further at this time.

## 2017-12-01 NOTE — Progress Notes (Signed)
ATC, NA and no option to leave msg 

## 2017-12-01 NOTE — Telephone Encounter (Signed)
Called and spoke to patient, made aware of CT results per MW. Voiced understanding. Patient would like to speak to MW as to why she is still congested and stopped up.   MW please advise if willing to call patient to discuss in more detail.

## 2017-12-01 NOTE — Telephone Encounter (Signed)
I don't have any insights into why this would be so needs to be referred to ENT/ dr Victorio Palm group

## 2017-12-27 ENCOUNTER — Telehealth: Payer: Self-pay | Admitting: Internal Medicine

## 2017-12-27 MED ORDER — AMOXICILLIN-POT CLAVULANATE 875-125 MG PO TABS: 1.0000 | ORAL_TABLET | Freq: Two times a day (BID) | ORAL | 0 refills | Status: DC

## 2017-12-27 MED ORDER — PREDNISONE 10 MG PO TABS: ORAL_TABLET | ORAL | 0 refills | Status: DC

## 2017-12-27 NOTE — Telephone Encounter (Signed)
Augmentin 875 mg take one pill twice daily  X 10 days - take at breakfast and supper with large glass of water.  It would help reduce the usual side effects (diarrhea and yeast infections) if you ate cultured yogurt at lunch.   Prednisone 10 mg take  4 each am x 2 days,   2 each am x 2 days,  1 each am x 2 days and stop   Ov with all meds in hand w/in next 2 weeks to see me - ok to add on

## 2017-12-27 NOTE — Telephone Encounter (Signed)
Pt is aware of below recommendations and voiced her understanding.  Rx for augmentin 875 and pred 10mg  has been sent to preferred pharmacy. Pt has been scheduled for OV with MW on 01/11/18. Nothing further is needed.

## 2017-12-27 NOTE — Telephone Encounter (Signed)
Spoke with the pt  She is c/o cough with yellow sputum, yellow nasal d/c and watery eyes  She states she was seen by Dr Janace Hoard and he gave her "some nasal spray and a pill"- unsure of med names and she states that they ghave not really helped her.  She denies any f/c/s, aches, increased SOB  She declined appt  Please advise thanks!

## 2018-01-11 ENCOUNTER — Encounter: Payer: Self-pay | Admitting: Internal Medicine

## 2018-01-11 ENCOUNTER — Ambulatory Visit: Payer: Medicare Other | Admitting: Internal Medicine

## 2018-01-11 DIAGNOSIS — J9611 Chronic respiratory failure with hypoxia: Secondary | ICD-10-CM | POA: Diagnosis not present

## 2018-01-11 DIAGNOSIS — J328 Other chronic sinusitis: Secondary | ICD-10-CM

## 2018-01-11 DIAGNOSIS — J449 Chronic obstructive pulmonary disease, unspecified: Secondary | ICD-10-CM

## 2018-01-11 MED ORDER — PREDNISONE 10 MG PO TABS: ORAL_TABLET | ORAL | 0 refills | Status: DC

## 2018-01-11 NOTE — Progress Notes (Signed)
Subjective:     Patient ID: Suzanne Dickson, female   DOB: [birth year]     MRN: [id number]    Brief patient profile:  51 yobf quit smoking on admit 09/02/14  and proved to have GOLD III copd 11/2014     History of Present Illness  09/22/2015  f/u ov/Quinterious Walraven re: copd III/ symb 1602bid on 2lpm at rest/ 4lpm ex  Chief Complaint  Patient presents with  . Follow-up    [place] had hemoptysis and increased SOB. She took round pred and augmentin and symptoms are some better. She has noticed minimal wheezing at night. She has used neb 3 x over the past 2 wks.   liked bevespi better but wasn't covered then and not doing as well on the symbicort though hfa not ideal  - see a/p rec Plan A = Automatic =stop symbicort and restart Bevespi Take 2 puffs first thing in am and then another 2 puffs about 12 hours later.  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 Acute Visit    Breathing has been worse x 2 wks. She has been producing some yellow sputum over the past 2 days. She states also wheezing. She has been using her albuterol inhaler 3 x per day and she has not used neb.   Dyspnea:  MMRC3 = baseline can't walk 100 yards even at a slow pace at a flat grade s stopping due to sob on 2lpm but sometimes on RA now worse 2 weeks assoc with watery rhinitis no better with zyrtec  Cough: more  productive x 2 weeks assoc with pnds but min yellow  Sleeping: 2lpm  Bed flat and 2 big pillows  SABA use: not usually needing but now tid  02: 2lpm baseline now up to 3lpm  rec Prednisone 10 mg take  4 each am x 2 days,   2 each am x 2 days,  1 each am x 2 days and stop  Plan A = Automatic = symbicort 160 Take 2 puffs first thing in am and then another 2 puffs about 12 hours later.  Plan B = Backup Only use your albuterol as a rescue medication Plan C = Crisis - only use your albuterol nebulizer if you first try Plan B and it fails to help > ok to use the nebulizer up to every 4 hours but if start needing it regularly call for immediate appointment Goal for 02 is to keep saturation over 90%  Please schedule a follow up office visit in 4 weeks, sooner if needed  with all medications /inhalers/ solutions in hand so we can verify exactly what you are taking. This includes all medications from all doctors and over the counters    11/16/2017  f/u  ov/Reginae Wolfrey re: copd II criteria but 02 dep/ did not bring all meds as req  Chief Complaint  Patient presents with  . Follow-up    Breathing better but back quite back to baseline. She still has rhinitis and PND. She is using her albuterol inhaler 8-10 x per wk.  She rarely uses her neb.   Dyspnea: .MMRC3 = can't walk 100 yards even at a slow pace at a flat grade s stopping due to sob  On RA or  3lpm  - not monitoring sats as rec  Cough: none  Sleeping: ok 1pillow / bed flat SABA use: as above  02: 2lpm hs / prn daytime  rec Prednisone 10 mg take  4 each am x 2 days,   2 each am x 2 days,  1 each am x 2 days and stop  - add  No 02 needed at rest, max 02 when walking more than room to room at home to prevent desats ENT eval  > byers saw 12/07/17 dx ? 02 irritation     12/27/17 phone care for flair of nasal congestion purulent bronchitis: Augmentin 875 mg take one pill twice daily  X 10 days  Prednisone 10 mg take  4 each am x 2 days,   2 each am x 2  days,  1 each am x 2 days and stop     01/11/2018  f/u ov/Hollyann Pablo re: gold II copd / still struggling with how/ when to use 02 which is irritating her nose Chief Complaint  Patient presents with  . Follow-up    COPD GOLD III/O2 dep  Dyspnea:  MMRC4  = sob if tries to leave home or while getting dressed   Cough: x 2 days hs coughing fits  Sleeping: bed flat, 1-2 pillow  SABA use: on prednisone no need 02: 2lpm hs not humidified    No obvious day to day or daytime variability or assoc excess/ purulent sputum or mucus plugs or hemoptysis or cp or chest tightness, subjective wheeze or overt sinus or hb symptoms.   Sleeping as above without nocturnal  or early am exacerbation  of respiratory  c/o's or need for noct saba. Also denies any obvious fluctuation of symptoms with weather or environmental changes or other aggravating or alleviating factors except as outlined above   No unusual exposure hx or h/o childhood pna/ asthma or knowledge of premature birth.  Current Allergies, Complete Past Medical History, Past Surgical History, Family History, and Social History were reviewed in [facility] record.  ROS  The following are not active complaints unless bolded Hoarseness, sore throat, dysphagia, dental problems, itching, sneezing,  nasal congestion or discharge of excess mucus or purulent secretions, ear ache,   fever, chills, sweats, unintended wt loss or wt gain, classically pleuritic or exertional cp,  orthopnea pnd or arm/hand swelling  or leg swelling, presyncope, palpitations, abdominal pain, anorexia, nausea, vomiting, diarrhea  or change in bowel habits or change in bladder habits, change in stools or change in urine, dysuria, hematuria,  rash, arthralgias, visual complaints, headache, numbness, weakness or ataxia or problems with walking or coordination,  change in mood or  memory.        Current Meds  Medication Sig  . albuterol (PROAIR HFA) 108 (90 Base) MCG/ACT  inhaler Inhale 2 puffs into the lungs every 4 (four) hours as needed for wheezing or shortness of breath.  Marland Kitchen albuterol (PROVENTIL) (2.5 MG/3ML) 0.083% nebulizer solution Take 3 mLs (2.5  mg total) by nebulization every 4 (four) hours as needed. For shortness of breath (Patient taking differently: Take 2.5 mg by nebulization every 4 (four) hours as needed for wheezing or shortness of breath. )  . amLODipine (NORVASC) 5 MG tablet Take 1 tablet (5 mg total) by mouth daily.  Marland Kitchen amoxicillin-clavulanate (AUGMENTIN) 875-125 MG tablet Take 1 tablet by mouth 2 (two) times daily.  Marland Kitchen aspirin 81 MG tablet Take 81 mg by mouth daily.  . budesonide-formoterol (SYMBICORT) 160-4.5 MCG/ACT inhaler Inhale 2 puffs into the lungs 2 (two) times daily.  . clopidogrel (PLAVIX) 75 MG tablet Take 75 mg by mouth daily.  . diphenhydrAMINE (SOMINEX) 25 MG tablet Take 25 mg by mouth at bedtime as needed for sleep.  Noelle Penner ASPIRIN ADULT LOW DOSE 81 MG EC tablet Take 81 mg by mouth daily.  . famotidine (PEPCID) 20 MG tablet Take 20 mg by mouth at bedtime.  . ferrous sulfate 325 (65 FE) MG EC tablet Take 325 mg by mouth 3 (three) times daily with meals.  Marland Kitchen guaiFENesin (MUCINEX) 600 MG 12 hr tablet Take 600 mg by mouth daily as needed (for allergies).   . losartan (COZAAR) 25 MG tablet Take 25 mg by mouth daily.  . metoprolol succinate (TOPROL-XL) 25 MG 24 hr tablet Take 25 mg by mouth daily.  . mometasone (NASONEX) 50 MCG/ACT nasal spray Place into the nose.  . montelukast (SINGULAIR) 10 MG tablet Take by mouth.  . nitroGLYCERIN (NITRODUR - DOSED IN MG/24 HR) 0.4 mg/hr patch 0.4 mg.  . nitroGLYCERIN (NITROSTAT) 0.4 MG SL tablet Place 0.4 mg under the tongue every 5 (five) minutes as needed for chest pain.  . OXYGEN 2lpm with sleep and 4 lpm with exertion  . pantoprazole (PROTONIX) 40 MG tablet TAKE 1 TABLET BY MOUTH ONCE DAILY 30-60 MINS BEFORE FIRST MEAL OF THE DAY  . predniSONE (DELTASONE) 10 MG tablet Take 1 tablet (10 mg total)  by mouth daily with breakfast.  .     . sertraline (ZOLOFT) 100 MG tablet Take 100 mg by mouth 2 (two) times daily.            Objective:   Physical Exam  Elderly wf nad   10/28/2014        160 > 12/09/2014 161 > 03/14/2015 158 >    04/25/2015  161 >  06/20/2015  158 > 09/22/2015   159 > 11/19/2015 159 > 12/23/2015   159  > 02/03/2016    163  > 05/18/2016   161 > 11/17/2016   154 > 01/07/2017  157 > 04/18/2017   148 > 07/19/2017  145 > 10/19/2017    150  > 11/16/2017   150 >  01/11/2018   150     10/10/14 160 lb (72.576 kg)  09/16/14 156 lb (70.761 kg)  09/04/14 160 lb 13.6 oz (72.96 kg)      Vital signs reviewed - Note on arrival 02 sats  92% on 2lpm       HEENT: nl dentition / oropharynx. Nl external ear canals without cough reflex -  Mod bilateral non-specific turbinate edema     NECK :  without JVD/Nodes/TM/ nl carotid upstrokes bilaterally   LUNGS: no acc muscle use,  Mild barrel  contour chest wall with bilateral  Distant bs s audible wheeze and  without cough on insp or exp maneuver and mild  Hyperresonant  to  percussion bilaterally     CV:  RRR  no s3  or murmur or increase in P2, and no edema   ABD:  soft and nontender with pos end insp Hoover's  in the supine position. No bruits or organomegaly appreciated, bowel sounds nl  MS:   Nl gait/  ext warm without deformities, calf tenderness, cyanosis or clubbing No obvious joint restrictions   SKIN: warm and dry without lesions    NEURO:  alert, approp, nl sensorium with  no motor or cerebellar deficits apparent.            Assessment:

## 2018-01-11 NOTE — Patient Instructions (Addendum)
Need to humidify your home 02 per [employer]  Goal for your 02 is to keep saturations over 90 so don't use it at rest unless you need   Elevate the head of your bed on 6-8 inch blocks  For cough > mucinex dm up to 1200 mg every 12 hours as needed   If get worse >  Prednisone 10 mg take  4 each am x 2 days,   2 each am x 2 days,  1 each am x 2 days and stop   Keep your appt that you have - call sooner if needed

## 2018-01-12 ENCOUNTER — Encounter: Payer: Self-pay | Admitting: Internal Medicine

## 2018-01-12 NOTE — Assessment & Plan Note (Signed)
Started on 02 2lpm at d/c 09/04/14  - 10/28/2014   Walked RA x one lap @ 185 stopped due to  desat to 82%  at nl pace so rec 02 2lpm with walking and sleeping  - 12/09/2014 referred for POC > did not qualify - 08/17/2016 Patient Saturations on Room Air at Rest = 85%---increased to 93% on 2lpm o2 - 04/18/2017  Walking across exam room sats dropped into 80's on 2lpm  - 11/16/2017   Walked RA x one lap @ 185 stopped due to 87% and even on 5lpm could not maintain sats at nl pace   -  01/11/2018 humidfy 02  Added   Also advised her to monitor sats and if ok at rest then leave off 02 so as not to contribute to rhinitis from the 02.

## 2018-01-12 NOTE — Assessment & Plan Note (Signed)
Quit smoking 08/2014 - PFT's  06/20/2015  FEV1 0.75 (44 % ) ratio 62  p no % improvement from saba p ? prior to study with DLCO  24 % corrects to 48 % for alv volume    - 12/09/2014 referred to rehab > started 12/2014  - 04/25/2015   try BEVESPI  - 06/20/2015  Changed by insurance> back on symb 160  - 09/22/2015 not happy with symbicort > changed back to besvespi 2bid > not happy with bevespi > changed back to symb 160 2bid and consider adding spiriva next vs trelegy tria   Despite flare of rhinitis/? Sinusitis > no change copd/ab at this point  Given prednisone x 6 day course if flares over the holidays.

## 2018-01-12 NOTE — Assessment & Plan Note (Signed)
Sinus CT 10/31/2014>  No sinusitis is seen  - Allergy profile 10/19/2017 >  Eos 0.1 /  IgE 67  RAST neg  - Repeat Sinus CT 11/30/17 >  Min thickening - ENT eval Janace Hoard 12/07/17 :  Non-specific crusting only ? Related to 02 irritation   Better p rx for sinusitis empirically and will have 02 humdified to address the rhintis.  Also strongly rec bed blocks in case some of her upper airway complaints are related to noct gerd    I had an extended discussion with the patient reviewing all relevant studies completed to date and  lasting 15 to 20 minutes of a 25 minute visit    Each maintenance medication was reviewed in detail including most importantly the difference between maintenance and prns and under what circumstances the prns are to be triggered using an action plan format that is not reflected in the computer generated alphabetically organized AVS.     Please see AVS for specific instructions unique to this visit that I personally wrote and verbalized to the the pt in detail and then reviewed with pt  by my nurse highlighting any  changes in therapy recommended at today's visit to their plan of care.

## 2018-02-10 ENCOUNTER — Telehealth: Payer: Self-pay | Admitting: Internal Medicine

## 2018-02-10 NOTE — Telephone Encounter (Signed)
Called pt re appt being changed due to MM being on call. Left vm for pt with new appt times.

## 2018-02-17 ENCOUNTER — Ambulatory Visit (INDEPENDENT_AMBULATORY_CARE_PROVIDER_SITE_OTHER): Payer: Medicare Other | Admitting: Internal Medicine

## 2018-02-17 ENCOUNTER — Encounter: Payer: Self-pay | Admitting: Internal Medicine

## 2018-02-17 DIAGNOSIS — J449 Chronic obstructive pulmonary disease, unspecified: Secondary | ICD-10-CM | POA: Diagnosis not present

## 2018-02-17 DIAGNOSIS — J9611 Chronic respiratory failure with hypoxia: Secondary | ICD-10-CM | POA: Diagnosis not present

## 2018-02-17 MED ORDER — PREDNISONE 10 MG PO TABS: ORAL_TABLET | ORAL | 0 refills | Status: DC

## 2018-02-17 MED ORDER — FLUTICASONE FUROATE-VILANTEROL 100-25 MCG/INH IN AEPB: 1.0000 | INHALATION_SPRAY | Freq: Every day | RESPIRATORY_TRACT | 0 refills | Status: DC

## 2018-02-17 MED ORDER — FLUTICASONE-UMECLIDIN-VILANT 100-62.5-25 MCG/INH IN AEPB: 1.0000 | INHALATION_SPRAY | Freq: Every day | RESPIRATORY_TRACT | 11 refills | Status: DC

## 2018-02-17 NOTE — Patient Instructions (Addendum)
Remember to turn up your 02 when walking to keep your 02 saturations above 90%   Stop symbicort for now  If breathing gets worse or need more albuterol than usual > Prednisone 10 mg take  4 each am x 2 days,   2 each am x 2 days,  1 each am x 2 days and stop   Try BREO one click eam am until you run out and replace with Trelegy one click each am (take two good drags off each click but just one click each am    Please schedule a follow up visit in 3 months but call sooner if needed

## 2018-02-17 NOTE — Progress Notes (Signed)
Subjective:     Patient ID: Suzanne Dickson, female   DOB: 05/29/1945     MRN: [id number]    Brief patient profile:  69 yobf quit smoking on admit 09/02/14  and proved to have GOLD III copd 11/2014     History of Present Illness  09/22/2015  f/u ov/Suzanne Dickson re: copd III/ symb 1602bid on 2lpm at rest/ 4lpm ex  Chief Complaint  Patient presents with  . Follow-up    [place] had hemoptysis and increased SOB. She took round pred and augmentin and symptoms are some better. She has noticed minimal wheezing at night. She has used neb 3 x over the past 2 wks.   liked bevespi better but wasn't covered then and not doing as well on the symbicort though hfa not ideal  - see a/p rec Plan A = Automatic =stop symbicort and restart Bevespi Take 2 puffs first thing in am and then another 2 puffs about 12 hours later.  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 Acute Visit    Breathing has been worse x 2 wks. She has been producing some yellow sputum over the past 2 days. She states also wheezing. She has been using her albuterol inhaler 3 x per day and she has not used neb.   Dyspnea:  MMRC3 = baseline can't walk 100 yards even at a slow pace at a flat grade s stopping due to sob on 2lpm but sometimes on RA now worse 2 weeks assoc with watery rhinitis no better with zyrtec  Cough: more  productive x 2 weeks assoc with pnds but min yellow  Sleeping: 2lpm  Bed flat and 2 big pillows  SABA use: not usually needing but now tid  02: 2lpm baseline now up to 3lpm  rec Prednisone 10 mg take  4 each am x 2 days,   2 each am x 2 days,  1 each am x 2 days and stop  Plan A = Automatic = symbicort 160 Take 2 puffs first thing in am and then another 2 puffs about 12 hours later.  Plan B = Backup Only use your albuterol as a rescue medication Plan C = Crisis - only use your albuterol nebulizer if you first try Plan B and it fails to help > ok to use the nebulizer up to every 4 hours but if start needing it regularly call for immediate appointment Goal for 02 is to keep saturation over 90%  Please schedule a follow up office visit in 4 weeks, sooner if needed  with all medications /inhalers/ solutions in hand so we can verify exactly what you are taking. This includes all medications from all doctors and over the counters    11/16/2017  f/u  ov/Suzanne Dickson re: copd III criteria but 02 dep/ did not bring all meds as req  Chief Complaint  Patient presents with  . Follow-up    Breathing better but back quite back to baseline. She still has rhinitis and PND. She is using her albuterol inhaler 8-10 x per wk.  She rarely uses her neb.   Dyspnea: .MMRC3 = can't walk 100 yards even at a slow pace at a flat grade s stopping due to sob  On RA or  3lpm  - not monitoring sats as rec  Cough: none  Sleeping: ok 1pillow / bed flat SABA use: as above  02: 2lpm hs / prn daytime  rec Prednisone 10 mg take  4 each am x 2 days,   2 each am x 2 days,  1 each am x 2 days and stop  - add  No 02 needed at rest, max 02 when walking more than room to room at home to prevent desats ENT eval  > byers saw 12/07/17 dx ? 02 irritation     12/27/17 phone care for flair of nasal congestion purulent bronchitis: Augmentin 875 mg take one pill twice daily  X 10 days  Prednisone 10 mg take  4 each am x 2 days,   2 each am x 2  days,  1 each am x 2 days and stop     01/11/2018  f/u ov/Suzanne Dickson re: gold III copd / still struggling with how/ when to use 02 which is irritating her nose Chief Complaint  Patient presents with  . Follow-up    COPD GOLD III/O2 dep  Dyspnea:  MMRC4  = sob if tries to leave home or while getting dressed   Cough: x 2 days hs coughing fits  Sleeping: bed flat, 1-2 pillow  SABA use: on prednisone no need 02: 2lpm hs not humidified   rec Need to humidify your home 02 per [employer] Goal for your 02 is to keep saturations over 90 so don't use it at rest unless you need  Elevate the head of your bed on 6-8 inch blocks For cough > mucinex dm up to 1200 mg every 12 hours as needed If get worse >  Prednisone 10 mg take  4 each am x 2 days,   2 each am x 2 days,  1 each am x 2 days and stop      02/17/2018  f/u ov/Suzanne Dickson re: gold III criteria but 02 dep, doing better with nasal symptoms on humidified 02  Chief Complaint  Patient presents with  . Follow-up    Breathing is some better. She has some nasal congestion in the am- clear, bloody nasal d/c. She is using her proair 2-3 x per wk on average.   Dyspnea:  Better vs priors/ pred helped nasal symptoms a lot and also doe  Cough: no Sleeping: 2 pillows/ bed blocks on order  SABA use: a little more since stopped prednisone  02: 2lpm 24/7 but up to 4 active   No obvious day to day or daytime variability or assoc excess/ purulent sputum or mucus plugs or hemoptysis or cp or chest tightness, subjective wheeze or overt sinus or hb symptoms.   Sleeping as above without nocturnal  or early am exacerbation  of respiratory  c/o's or need for noct saba. Also denies any obvious fluctuation of symptoms with weather or environmental changes or other aggravating or alleviating factors except as outlined above.  No unusual exposure hx or h/o  childhood pna/ asthma or knowledge of premature birth.  Current Allergies, Complete Past Medical History, Past Surgical  History, Family History, and Social History were reviewed in [employer] record.  ROS  The following are not active complaints unless bolded Hoarseness, sore throat, dysphagia, dental problems, itching, sneezing,  nasal congestion or discharge of excess mucus or purulent secretions, ear ache,   fever, chills, sweats, unintended wt loss or wt gain, classically pleuritic or exertional cp,  orthopnea pnd or arm/hand swelling  or leg swelling, presyncope, palpitations, abdominal pain, anorexia, nausea, vomiting, diarrhea  or change in bowel habits or change in bladder habits, change in stools or change in urine, dysuria, hematuria,  rash, arthralgias, visual complaints, headache, numbness, weakness or ataxia or problems with walking or coordination,  change in mood or  memory.        Current Meds  Medication Sig  . albuterol (PROAIR HFA) 108 (90 Base) MCG/ACT inhaler Inhale 2 puffs into the lungs every 4 (four) hours as needed for wheezing or shortness of breath.  Marland Kitchen albuterol (PROVENTIL) (2.5 MG/3ML) 0.083% nebulizer solution Take 3 mLs (2.5 mg total) by nebulization every 4 (four) hours as needed. For shortness of breath (Patient taking differently: Take 2.5 mg by nebulization every 4 (four) hours as needed for wheezing or shortness of breath. )  . amLODipine (NORVASC) 5 MG tablet Take 1 tablet (5 mg total) by mouth daily.  Marland Kitchen aspirin 81 MG tablet Take 81 mg by mouth daily.  . budesonide-formoterol (SYMBICORT) 160-4.5 MCG/ACT inhaler Inhale 2 puffs into the lungs 2 (two) times daily.  . clopidogrel (PLAVIX) 75 MG tablet Take 75 mg by mouth daily.  . diphenhydrAMINE (SOMINEX) 25 MG tablet Take 25 mg by mouth at bedtime as needed for sleep.  . famotidine (PEPCID) 20 MG tablet Take 20 mg by mouth at bedtime.  . ferrous sulfate 325 (65 FE) MG EC tablet Take 325 mg by mouth 3 (three) times daily with meals.  Marland Kitchen guaiFENesin (MUCINEX) 600 MG 12 hr tablet Take 600 mg by mouth daily as  needed (for allergies).   . losartan (COZAAR) 25 MG tablet Take 25 mg by mouth daily.  . metoprolol succinate (TOPROL-XL) 25 MG 24 hr tablet Take 25 mg by mouth daily.  . mometasone (NASONEX) 50 MCG/ACT nasal spray Place into the nose.  . montelukast (SINGULAIR) 10 MG tablet Take by mouth.  . nitroGLYCERIN (NITRODUR - DOSED IN MG/24 HR) 0.4 mg/hr patch 0.4 mg.  . nitroGLYCERIN (NITROSTAT) 0.4 MG SL tablet Place 0.4 mg under the tongue every 5 (five) minutes as needed for chest pain.  . OXYGEN 2lpm with sleep and 4 lpm with exertion  . pantoprazole (PROTONIX) 40 MG tablet TAKE 1 TABLET BY MOUTH ONCE DAILY 30-60 MINS BEFORE FIRST MEAL OF THE DAY  . sertraline (ZOLOFT) 100 MG tablet Take 100 mg by mouth 2 (two) times daily.                Objective:   Physical Exam  Pleasant amb bf nad   10/28/2014        160 > 12/09/2014 161 > 03/14/2015 158 >    04/25/2015  161 >  06/20/2015  158 > 09/22/2015   159 > 11/19/2015 159 > 12/23/2015   159  > 02/03/2016    163  > 05/18/2016   161 > 11/17/2016   154 > 01/07/2017  157 > 04/18/2017   148 > 07/19/2017  145 > 10/19/2017  150  > 11/16/2017   150 >  01/11/2018   150> 02/17/2018  150     10/10/14 160 lb (72.576 kg)  09/16/14 156 lb (70.761 kg)  09/04/14 160 lb 13.6 oz (72.96 kg)     Vital signs reviewed - Note on arrival 02 sats  94% on 2lpm cont       HEENT: nl dentition / oropharynx. Nl external ear canals without cough reflex -  Mild bilateral non-specific turbinate edema     NECK :  without JVD/Nodes/TM/ nl carotid upstrokes bilaterally   LUNGS: no acc muscle use,  Mild barrel  contour chest wall with bilateral  Distant bs s audible wheeze and  without cough on insp or exp maneuver and mild  Hyperresonant  to  percussion bilaterally     CV:  RRR  no s3 or murmur or increase in P2, and no edema   ABD:  soft and nontender with pos late insp Hoover's  in the supine position. No bruits or organomegaly appreciated, bowel sounds nl  MS:   Nl gait/   ext warm without deformities, calf tenderness, cyanosis or clubbing No obvious joint restrictions   SKIN: warm and dry without lesions    NEURO:  alert, approp, nl sensorium with  no motor or cerebellar deficits apparent.              Assessment:

## 2018-02-18 ENCOUNTER — Encounter: Payer: Self-pay | Admitting: Internal Medicine

## 2018-02-18 NOTE — Assessment & Plan Note (Signed)
Started on 02 2lpm at d/c 09/04/14  - 10/28/2014   Walked RA x one lap @ 185 stopped due to  desat to 82%  at nl pace so rec 02 2lpm with walking and sleeping  - 12/09/2014 referred for POC > did not qualify - 08/17/2016 Patient Saturations on Room Air at Rest = 85%---increased to 93% on 2lpm o2 - 04/18/2017  Walking across exam room sats dropped into 80's on 2lpm  - 11/16/2017   Walked RA x one lap @ 185 stopped due to 87% and even on 5lpm could not maintain sats at nl pace  -  01/11/2018 humidfy 02  Added   Reminded to check sats walking and adjust up if needed with goal of > 90% sat   I had an extended discussion with the patient reviewing all relevant studies completed to date and  lasting 15 to 20 minutes of a 25 minute visit    See device teaching which extended face to face time for this visit.  Each maintenance medication was reviewed in detail including emphasizing most importantly the difference between maintenance and prns and under what circumstances the prns are to be triggered using an action plan format that is not reflected in the computer generated alphabetically organized AVS which I have not found useful in most complex patients, especially with respiratory illnesses  Please see AVS for specific instructions unique to this visit that I personally wrote and verbalized to the the pt in detail and then reviewed with pt  by my nurse highlighting any  changes in therapy recommended at today's visit to their plan of care.

## 2018-02-18 NOTE — Assessment & Plan Note (Addendum)
Quit smoking 08/2014 - PFT's 12/09/14   FEV1 0.75 (44 % ) ratio 62  p no % improvement from saba p ? prior to study with DLCO  24 % corrects to 48 % for alv volume    - 12/09/2014 referred to rehab > started 12/2014  - 04/25/2015   try BEVESPI  - 06/20/2015  Changed by insurance> back on symb 160  - 09/22/2015 not happy with symbicort > changed back to besvespi 2bid > not happy with bevespi > changed back to symb 160 2bid and consider adding spiriva next vs trelegy trial    - 02/17/2018  After extensive coaching inhaler device,  effectiveness =    50% with hfa and 90% with elipta so rec change to trelegy and see if get same benefit as with short course of prednisone (=6days) to keep on hand if flare between visits again   Advised:  formulary restrictions will be an ongoing challenge for the forseable future and I would be happy to pick an alternative if the pt will first  provide me a list of them -  pt  will need to return here for training for any new device that is required eg dpi vs hfa vs respimat.    In the meantime we can always provide samples so that the patient never runs out of any needed respiratory medications.    >>>>F/u q 3 m, sooner prn

## 2018-02-20 ENCOUNTER — Inpatient Hospital Stay: Payer: Medicare Other | Attending: Internal Medicine

## 2018-02-20 ENCOUNTER — Ambulatory Visit (HOSPITAL_COMMUNITY)
Admission: RE | Admit: 2018-02-20 | Discharge: 2018-02-20 | Disposition: A | Discharge status: Home / Self Care | Payer: Medicare Other | Source: Ambulatory Visit | Attending: Internal Medicine | Admitting: Internal Medicine

## 2018-02-20 DIAGNOSIS — C3412 Malignant neoplasm of upper lobe, left bronchus or lung: Secondary | ICD-10-CM | POA: Diagnosis not present

## 2018-02-20 DIAGNOSIS — C3490 Malignant neoplasm of unspecified part of unspecified bronchus or lung: Secondary | ICD-10-CM

## 2018-02-20 LAB — CBC WITH DIFFERENTIAL (CANCER CENTER ONLY)
ABS IMMATURE GRANULOCYTES: 0.02 10*3/uL (ref 0.00–0.07)
Basophils Absolute: 0 10*3/uL (ref 0.0–0.1)
Basophils Relative: 1 %
Eosinophils Absolute: 0.2 10*3/uL (ref 0.0–0.5)
Eosinophils Relative: 3 %
HCT: 32.2 % — ABNORMAL LOW (ref 36.0–46.0)
Hemoglobin: 9.5 g/dL — ABNORMAL LOW (ref 12.0–15.0)
Immature Granulocytes: 0 %
LYMPHS ABS: 1.1 10*3/uL (ref 0.7–4.0)
Lymphocytes Relative: 19 %
MCH: 28.3 pg (ref 26.0–34.0)
MCHC: 29.5 g/dL — ABNORMAL LOW (ref 30.0–36.0)
MCV: 95.8 fL (ref 80.0–100.0)
MONOS PCT: 9 %
Monocytes Absolute: 0.5 10*3/uL (ref 0.1–1.0)
Neutro Abs: 4.2 10*3/uL (ref 1.7–7.7)
Neutrophils Relative %: 68 %
Platelet Count: 189 10*3/uL (ref 150–400)
RBC: 3.36 MIL/uL — ABNORMAL LOW (ref 3.87–5.11)
RDW: 13.5 % (ref 11.5–15.5)
WBC Count: 6.1 10*3/uL (ref 4.0–10.5)
nRBC: 0 % (ref 0.0–0.2)

## 2018-02-20 LAB — CMP (CANCER CENTER ONLY)
ALBUMIN: 4 g/dL (ref 3.5–5.0)
ALT: 17 U/L (ref 0–44)
AST: 20 U/L (ref 15–41)
Alkaline Phosphatase: 80 U/L (ref 38–126)
Anion gap: 7 (ref 5–15)
BUN: 19 mg/dL (ref 8–23)
CHLORIDE: 106 mmol/L (ref 98–111)
CO2: 31 mmol/L (ref 22–32)
Calcium: 9.5 mg/dL (ref 8.9–10.3)
Creatinine: 1.04 mg/dL — ABNORMAL HIGH (ref 0.44–1.00)
GFR, Est AFR Am: >60 mL/min (ref >60)
GFR, Estimated: 54 mL/min — ABNORMAL LOW (ref >60)
GLUCOSE: 96 mg/dL (ref 70–99)
Potassium: 4.5 mmol/L (ref 3.5–5.1)
Sodium: 144 mmol/L (ref 135–145)
Total Bilirubin: 0.4 mg/dL (ref 0.3–1.2)
Total Protein: 7.4 g/dL (ref 6.5–8.1)

## 2018-02-21 ENCOUNTER — Ambulatory Visit: Payer: Medicare Other | Admitting: Internal Medicine

## 2018-02-27 ENCOUNTER — Inpatient Hospital Stay: Payer: Medicare Other | Attending: Internal Medicine | Admitting: Internal Medicine

## 2018-02-27 ENCOUNTER — Telehealth: Payer: Self-pay

## 2018-02-27 ENCOUNTER — Encounter: Payer: Self-pay | Admitting: Internal Medicine

## 2018-02-27 VITALS — BP 156/58 | HR 82 | Temp 98.3°F | Resp 20 | Ht 63.0 in | Wt 148.6 lb

## 2018-02-27 DIAGNOSIS — Z79899 Other long term (current) drug therapy: Secondary | ICD-10-CM | POA: Insufficient documentation

## 2018-02-27 DIAGNOSIS — I509 Heart failure, unspecified: Secondary | ICD-10-CM | POA: Diagnosis not present

## 2018-02-27 DIAGNOSIS — J449 Chronic obstructive pulmonary disease, unspecified: Secondary | ICD-10-CM | POA: Diagnosis not present

## 2018-02-27 DIAGNOSIS — C3490 Malignant neoplasm of unspecified part of unspecified bronchus or lung: Secondary | ICD-10-CM

## 2018-02-27 DIAGNOSIS — C3412 Malignant neoplasm of upper lobe, left bronchus or lung: Secondary | ICD-10-CM | POA: Diagnosis present

## 2018-02-27 DIAGNOSIS — Z7982 Long term (current) use of aspirin: Secondary | ICD-10-CM | POA: Diagnosis not present

## 2018-02-27 DIAGNOSIS — I110 Hypertensive heart disease with heart failure: Secondary | ICD-10-CM | POA: Diagnosis not present

## 2018-02-27 DIAGNOSIS — C3492 Malignant neoplasm of unspecified part of left bronchus or lung: Secondary | ICD-10-CM

## 2018-02-27 DIAGNOSIS — Z9981 Dependence on supplemental oxygen: Secondary | ICD-10-CM | POA: Insufficient documentation

## 2018-02-27 DIAGNOSIS — I10 Essential (primary) hypertension: Secondary | ICD-10-CM

## 2018-02-27 NOTE — Telephone Encounter (Signed)
Printed avs and calender of upcoming appointment. Per 2/3 los

## 2018-02-27 NOTE — Progress Notes (Signed)
[facility] [phone]   [phone]  OFFICE PROGRESS NOTE  [name], MD [phone] [place] [address]  DIAGNOSIS: Stage IA (T1a, N0, M0) non-small cell lung cancer, adenocarcinoma presented with left upper lobe lung nodule diagnosed in March 2018.  PRIOR THERAPY: S/P RT to the left lung nodule under the care of Dr. Sondra Come completed on 05/04/2016.  CURRENT THERAPY: Observation.  INTERVAL HISTORY: Suzanne Dickson 73 y.o. female returns to the clinic today for 6 months follow-up visit accompanied by her husband.  The patient is feeling fine today with no concerning complaints except for the baseline shortness of breath and she is currently on home oxygen.  She denied having any chest pain, cough or hemoptysis.  She denied having any recent weight loss or night sweats.  She has no nausea, vomiting, diarrhea or constipation.  She denied having any headache or visual changes.  She is here today for evaluation with repeat CT scan of the chest for restaging of her disease.  MEDICAL HISTORY: Past Medical History:  Diagnosis Date  . Adenocarcinoma of left lung, stage 1 ([place]) 04/15/2016   "I'm in remission" (04/05/2017)  . Anxiety   . Arthritis    "legs, elbows" (04/05/2017)  . Bronchitis, chronic ([place])    "haven't had it in awhile" (04/05/2017)  . CHF (congestive heart failure) ([facility] [facility])   . COPD (chronic obstructive pulmonary disease) ([place])   . Daily headache    "recently" (04/05/2017)  . Depression   . Dyspnea    with exertion  . GERD (gastroesophageal reflux disease)   . History of blood transfusion 1950s  . History of radiation therapy 05/04/16 - 05/12/16   Left lung treated to 54 Gy with 3 fx of 18 Gy  . Hypertension   . On home oxygen therapy    "2L; 24/7" (04/05/2017)  . OSA on CPAP   . Pneumonia 10/2015   "at least 3 times" (04/05/2017)    ALLERGIES:  has No Known Allergies.  MEDICATIONS:  Current Outpatient  Medications  Medication Sig Dispense Refill  . albuterol (PROAIR HFA) 108 (90 Base) MCG/ACT inhaler Inhale 2 puffs into the lungs every 4 (four) hours as needed for wheezing or shortness of breath. 1 Inhaler 5  . albuterol (PROVENTIL) (2.5 MG/3ML) 0.083% nebulizer solution Take 3 mLs (2.5 mg total) by nebulization every 4 (four) hours as needed. For shortness of breath (Patient taking differently: Take 2.5 mg by nebulization every 4 (four) hours as needed for wheezing or shortness of breath. ) 75 mL 12  . amLODipine (NORVASC) 5 MG tablet Take 1 tablet (5 mg total) by mouth daily. 30 tablet 3  . aspirin 81 MG tablet Take 81 mg by mouth daily.    . clopidogrel (PLAVIX) 75 MG tablet Take 75 mg by mouth daily.    . diphenhydrAMINE (SOMINEX) 25 MG tablet Take 25 mg by mouth at bedtime as needed for sleep.    . famotidine (PEPCID) 20 MG tablet Take 20 mg by mouth at bedtime.    . ferrous sulfate 325 (65 FE) MG EC tablet Take 325 mg by mouth 3 (three) times daily with meals.    . fluticasone furoate-vilanterol (BREO ELLIPTA) 100-25 MCG/INH AEPB Inhale 1 puff into the lungs daily. 14 each 0  . Fluticasone-Umeclidin-Vilant (TRELEGY ELLIPTA) 100-62.5-25 MCG/INH AEPB Inhale 1 puff into the lungs daily. 60 each 11  . losartan (COZAAR) 25 MG tablet Take 25 mg  by mouth daily.    . metoprolol succinate (TOPROL-XL) 25 MG 24 hr tablet Take 25 mg by mouth daily.    . nitroGLYCERIN (NITRODUR - DOSED IN MG/24 HR) 0.4 mg/hr patch 0.4 mg.  3  . OXYGEN 2lpm with sleep and 4 lpm with exertion    . pantoprazole (PROTONIX) 40 MG tablet TAKE 1 TABLET BY MOUTH ONCE DAILY 30-60 MINS BEFORE FIRST MEAL OF THE DAY 30 tablet 5  . sertraline (ZOLOFT) 100 MG tablet Take 100 mg by mouth 2 (two) times daily.  0  . guaiFENesin (MUCINEX) 600 MG 12 hr tablet Take 600 mg by mouth daily as needed (for allergies).     . mometasone (NASONEX) 50 MCG/ACT nasal spray Place into the nose.    . montelukast (SINGULAIR) 10 MG tablet Take by  mouth.    . nitroGLYCERIN (NITROSTAT) 0.4 MG SL tablet Place 0.4 mg under the tongue every 5 (five) minutes as needed for chest pain.    . predniSONE (DELTASONE) 10 MG tablet Take  4 each am x 2 days,   2 each am x 2 days,  1 each am x 2 days and stop (Patient not taking: Reported on 02/27/2018) 14 tablet 0   No current facility-administered medications for this visit.     SURGICAL HISTORY:  Past Surgical History:  Procedure Laterality Date  . COLONOSCOPY    . CORONARY ANGIOPLASTY WITH STENT PLACEMENT  04/05/2017  . CORONARY STENT INTERVENTION N/A 04/05/2017   Procedure: CORONARY STENT INTERVENTION;  Surgeon: [name], MD;  Location: [facility];  Service: Cardiovascular;  Laterality: N/A;  . DILATION AND CURETTAGE OF UTERUS    . FUDUCIAL PLACEMENT Left 03/31/2016   Procedure: PLACEMENT OF FUDUCIAL LEFT UPPER LOBE;  Surgeon: Collene Gobble, MD;  Location: [facility];  Service: Thoracic;  Laterality: Left;  . LAPAROSCOPIC CHOLECYSTECTOMY    . LEFT HEART CATH AND CORONARY ANGIOGRAPHY N/A 02/17/2017   Procedure: LEFT HEART CATH AND CORONARY ANGIOGRAPHY;  Surgeon: [name], MD;  Location: [facility];  Service: Cardiovascular;  Laterality: N/A;  . SINUSOTOMY    . TONSILLECTOMY    . TUBAL LIGATION    . VIDEO BRONCHOSCOPY WITH ENDOBRONCHIAL NAVIGATION N/A 03/31/2016   Procedure: VIDEO BRONCHOSCOPY WITH ENDOBRONCHIAL NAVIGATION;  Surgeon: Collene Gobble, MD;  Location: [facility];  Service: Thoracic;  Laterality: N/A;    REVIEW OF SYSTEMS:  A comprehensive review of systems was negative except for: Constitutional: positive for fatigue Respiratory: positive for dyspnea on exertion   PHYSICAL EXAMINATION: General appearance: alert, cooperative, fatigued and no distress Head: Normocephalic, without obvious abnormality, atraumatic Neck: no adenopathy, no JVD, supple, symmetrical, trachea midline and thyroid not enlarged, symmetric, no tenderness/mass/nodules Lymph nodes: Cervical,  supraclavicular, and axillary nodes normal. Resp: clear to auscultation bilaterally Back: symmetric, no curvature. ROM normal. No CVA tenderness. Cardio: regular rate and rhythm, S1, S2 normal, no murmur, click, rub or gallop GI: soft, non-tender; bowel sounds normal; no masses,  no organomegaly Extremities: extremities normal, atraumatic, no cyanosis or edema  ECOG PERFORMANCE STATUS: 1 - Symptomatic but completely ambulatory  Blood pressure (!) 156/58, pulse 82, temperature 98.3 F (36.8 C), temperature source Oral, resp. rate 20, height 5\' 3"  (1.6 m), weight 148 lb 9.6 oz (67.4 kg), SpO2 95 %.  LABORATORY DATA: Lab Results  Component Value Date   WBC 6.1 02/20/2018   HGB 9.5 (L) 02/20/2018   HCT 32.2 (L) 02/20/2018   MCV 95.8 02/20/2018  PLT 189 02/20/2018      Chemistry      Component Value Date/Time   NA 144 02/20/2018 1513   NA 141 08/13/2016 1120   K 4.5 02/20/2018 1513   K 3.4 (L) 08/13/2016 1120   CL 106 02/20/2018 1513   CO2 31 02/20/2018 1513   CO2 25 08/13/2016 1120   BUN 19 02/20/2018 1513   BUN 15.8 08/13/2016 1120   CREATININE 1.04 (H) 02/20/2018 1513   CREATININE 1.1 08/13/2016 1120      Component Value Date/Time   CALCIUM 9.5 02/20/2018 1513   CALCIUM 9.1 08/13/2016 1120   ALKPHOS 80 02/20/2018 1513   ALKPHOS 68 08/13/2016 1120   AST 20 02/20/2018 1513   AST 20 08/13/2016 1120   ALT 17 02/20/2018 1513   ALT 14 08/13/2016 1120   BILITOT 0.4 02/20/2018 1513   BILITOT 0.47 08/13/2016 1120       RADIOGRAPHIC STUDIES: Ct Chest Wo Contrast  Result Date: 02/20/2018 CLINICAL DATA:  Non-small-cell lung cancer. EXAM: CT CHEST WITHOUT CONTRAST TECHNIQUE: Multidetector CT imaging of the chest was performed following the standard protocol without IV contrast. COMPARISON:  08/19/2017 FINDINGS: Cardiovascular: The heart size is normal. No substantial pericardial effusion. Coronary artery calcification is evident. Atherosclerotic calcification is noted in the  wall of the thoracic aorta. Mediastinum/Nodes: 6 mm short axis prevascular lymph node is stable. No mediastinal lymphadenopathy. No evidence for gross hilar lymphadenopathy although assessment is limited by the lack of intravenous contrast on today's study. The esophagus has normal imaging features. There is no axillary lymphadenopathy. Lungs/Pleura: The central tracheobronchial airways are patent. Centrilobular and paraseptal emphysema noted. Radiation changes are seen in the paramediastinal left lung in the region of fiducial markers. The measured lesions seen on the previous study is no longer discretely measurable. A 7 mm nodule described as new on the prior study is no longer evident. No new suspicious nodule or mass. No pleural effusion. Upper Abdomen: Unremarkable Musculoskeletal: No worrisome lytic or sclerotic osseous abnormality. IMPRESSION: 1. Stable exam. No new or progressive findings. 2. Radiation changes paramediastinal left lung. 3.  Emphysema. (ICD10-J43.9) 4.  Aortic Atherosclerois (ICD10-170.0) Electronically Signed   By: Misty Stanley M.D.   On: 02/20/2018 17:05    ASSESSMENT AND PLAN: This is a very pleasant 73 years old white female with a stage IA non-small cell lung cancer, adenocarcinoma presented with left upper lobe lung nodule. The patient is status post stereotactic radiotherapy to the left upper lobe lung nodule and she tolerated the procedure well. The patient is currently on observation and she is feeling fine. She had repeat CT scan of the chest performed recently.  I personally and independently reviewed the scans and discussed the results with the patient and her husband.  Her scan showed no concerning findings for disease recurrence or progression. I recommended for the patient to continue on observation with repeat CT scan of the chest in 6 months. For the COPD, she will continue with the home oxygen and inhalers as needed. The patient was advised to call immediately if  she has any concerning symptoms in the interval. The patient voices understanding of current disease status and treatment options and is in agreement with the current care plan. All questions were answered. The patient knows to call the clinic with any problems, questions or concerns. We can certainly see the patient much sooner if necessary. I spent 10 minutes counseling the patient face to face. The total time spent in the appointment  was 15 minutes.  Disclaimer: This note was dictated with voice recognition software. Similar sounding words can inadvertently be transcribed and may not be corrected upon review.

## 2018-05-01 ENCOUNTER — Other Ambulatory Visit: Payer: Self-pay | Admitting: Internal Medicine

## 2018-05-08 ENCOUNTER — Other Ambulatory Visit: Payer: Self-pay

## 2018-05-08 ENCOUNTER — Ambulatory Visit (INDEPENDENT_AMBULATORY_CARE_PROVIDER_SITE_OTHER): Payer: Medicare Other | Admitting: Internal Medicine

## 2018-05-08 ENCOUNTER — Telehealth: Payer: Self-pay | Admitting: Internal Medicine

## 2018-05-08 ENCOUNTER — Encounter: Payer: Self-pay | Admitting: Internal Medicine

## 2018-05-08 DIAGNOSIS — J9611 Chronic respiratory failure with hypoxia: Secondary | ICD-10-CM

## 2018-05-08 DIAGNOSIS — J449 Chronic obstructive pulmonary disease, unspecified: Secondary | ICD-10-CM | POA: Diagnosis not present

## 2018-05-08 MED ORDER — PREDNISONE 10 MG PO TABS: ORAL_TABLET | ORAL | 11 refills | Status: DC

## 2018-05-08 NOTE — Assessment & Plan Note (Signed)
Started on 02 2lpm at d/c 09/04/14  - 10/28/2014   Walked RA x one lap @ 185 stopped due to  desat to 82%  at nl pace so rec 02 2lpm with walking and sleeping  - 12/09/2014 referred for POC > did not qualify - 08/17/2016 Patient Saturations on Room Air at Rest = 85%---increased to 93% on 2lpm o2 - 04/18/2017  Walking across exam room sats dropped into 80's on 2lpm  - 11/16/2017   Walked RA x one lap @ 185 stopped due to 87% and even on 5lpm could not maintain sats at nl pace  -  01/11/2018 humidfy 02  Added > not using as of 05/08/2018 > rec she do so and monitor 02 sats with goal of keeping > 90%

## 2018-05-08 NOTE — Telephone Encounter (Signed)
televisit added pt aware

## 2018-05-08 NOTE — Telephone Encounter (Signed)
Called and no answer.  No option to leave a voice message.

## 2018-05-08 NOTE — Patient Instructions (Signed)
Plan A = Automatic = Trelegy one click first thing each am   Plan B = Backup Only use your albuterol as a rescue medication to be used if you can't catch your breath by resting or doing a relaxed purse lip breathing pattern.  - The less you use it, the better it will work when you need it. - Ok to use the inhaler up to 2 puffs  every 4 hours if you must but call for appointment if use goes up over your usual need - Don't leave home without it !!  (think of it like the spare tire for your car)   Plan C = Crisis - only use your albuterol nebulizer if you first try Plan B and it fails to help > ok to use the nebulizer up to every 4 hours but if start needing it regularly call for immediate appointment  Plan D = Deltasone= Prednisone - if needing the nebulizer more than twice daily then start prednisone : Prednisone 10 mg take  4 each am x 2 days,   2 each am x 2 days,  1 each am x 2 days and stop   Use humdified 02 and adjust to sats overy 90% during the day   Please change  follow up visit in 3 months but call sooner if needed  with all medications /inhalers/ solutions in hand so we can verify exactly what you are taking. This includes all medications from all doctors and over the counters

## 2018-05-08 NOTE — Assessment & Plan Note (Addendum)
Quit smoking 08/2014 - PFT's 12/09/14   FEV1 0.75 (44 % ) ratio 62  p no % improvement from saba p ? prior to study with DLCO  24 % corrects to 48 % for alv volume    - 12/09/2014 referred to rehab > started 12/2014  - 04/25/2015   try BEVESPI  - 06/20/2015  Changed by insurance> back on symb 160  - 09/22/2015 not happy with symbicort > changed back to besvespi 2bid > not happy with bevespi > changed back to symb 160 2bid and consider adding spiriva next vs trelegy trial   - 02/17/2018  After extensive coaching inhaler device,  effectiveness =    50% with hfa and 90% with elipta so rec changed to trelegy    - 05/08/2018 added pred x 6 days as "plan D"  Mild recurrent flares on trelegy assoc with nasal congestion but nothing that sounds like resp or systemic infection and has has responded to prednisone well in past   The goal with a chronic steroid dependent illness is always arriving at the lowest effective dose that controls the disease/symptoms and not accepting a set "formula" which is based on statistics or guidelines that don't always take into account patient  variability or the natural hx of the dz in every individual patient, which may well vary over time.  For now therefore I recommend the patient maintain off prednisone but if having to use the neb as back up more than twice daily >  Prednisone 10 mg take  4 each am x 2 days,   2 each am x 2 days,  1 each am x 2 days and stop as plan D in the ABCDE plan   Each maintenance medication was reviewed in detail including most importantly the difference between maintenance and as needed and under what circumstances the prns are to be used.  Please see AVS for specific  Instructions which are unique to this visit and I personally typed out  which were reviewed in detail  with the patient and assured she can access this in my chart.    F/u in 3 months, sooner if needed

## 2018-05-08 NOTE — Telephone Encounter (Signed)
Needs televisit

## 2018-05-08 NOTE — Telephone Encounter (Signed)
Primary Pulmonologist: Dr. Melvyn Novas Last office visit and with whom: 02/17/2018 What do we see them for (pulmonary problems): Chronic respiratory failure w/ hypoxia  Reason for call: Pt assumes her symptoms are allergy related. Has mild cough no mucus, drainage, eye burning, R ear itching, nasal dripping, wheezing at night and some SOB w/ rest & exertion starting 3w ago. Pt normally on 2L O2 at rest, but has had to increase to 3L recently. Uses 4L w/ extertion. Pt is currently taking Mucinex 2 times daily and a nasal spray once daily prescribed by ENT doctor. She uses her rescue inhaler q4h some days and rarely on others. Currently taking Trelegy. States she lost her oximeter recently, but feels like her O2 sats have been lower than usual.   In the last month, have you been in contact with someone who was confirmed or suspected to have Conoravirus / COVID-19?  No  Do you have any of the following symptoms developed in the last 30 days? Fever: No Cough: Mild cough Shortness of breath: Yes  When did your symptoms start?  3 weeks ago  If the patient has a fever, what is the last reading?  (use n/a if patient denies fever)  N/A . IF THE PATIENT STATES THEY DO NOT OWN A THERMOMETER, THEY MUST GO AND PURCHASE ONE When did the fever start?: N/A Have you taken any medication to suppress a fever (ie Ibuprofen, Aleve, Tylenol)?: N/A  Dr. Melvyn Novas, please advise. Thank you.

## 2018-05-08 NOTE — Progress Notes (Signed)
Subjective:     Patient ID: Suzanne Dickson, female   DOB: February 24, 1945     MRN: [id number]    Brief patient profile:  22 yobf quit smoking on admit 09/02/14  and proved to have GOLD III copd 11/2014     History of Present Illness  09/22/2015  f/u ov/Suzanne Dickson re: copd III/ symb 1602bid on 2lpm at rest/ 4lpm ex  Chief Complaint  Patient presents with  . Follow-up    [place] had hemoptysis and increased SOB. She took round pred and augmentin and symptoms are some better. She has noticed minimal wheezing at night. She has used neb 3 x over the past 2 wks.   liked bevespi better but wasn't covered then and not doing as well on the symbicort though hfa not ideal  - see a/p rec Plan A = Automatic =stop symbicort and restart Bevespi Take 2 puffs first thing in am and then another 2 puffs about 12 hours later.  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 Acute Visit    Breathing has been worse x 2 wks. She has been producing some yellow sputum over the past 2 days. She states also wheezing. She has been using her albuterol inhaler 3 x per day and she has not used neb.   Dyspnea:  MMRC3 = baseline can't walk 100 yards even at a slow pace at a flat grade s stopping due to sob on 2lpm but sometimes on RA now worse 2 weeks assoc with watery rhinitis no better with zyrtec  Cough: more  productive x 2 weeks assoc with pnds but min yellow  Sleeping: 2lpm  Bed flat and 2 big pillows  SABA use: not usually needing but now tid  02: 2lpm baseline now up to 3lpm  rec Prednisone 10 mg take  4 each am x 2 days,   2 each am x 2 days,  1 each am x 2 days and stop  Plan A = Automatic = symbicort 160 Take 2 puffs first thing in am and then another 2 puffs about 12 hours later.  Plan B = Backup Only use your albuterol as a rescue medication Plan C = Crisis - only use your albuterol nebulizer if you first try Plan B and it fails to help > ok to use the nebulizer up to every 4 hours but if start needing it regularly call for immediate appointment Goal for 02 is to keep saturation over 90%  Please schedule a follow up office visit in 4 weeks, sooner if needed  with all medications /inhalers/ solutions in hand so we can verify exactly what you are taking. This includes all medications from all doctors and over the counters    11/16/2017  f/u  ov/Suzanne Dickson re: copd III criteria but 02 dep/ did not bring all meds as req  Chief Complaint  Patient presents with  . Follow-up    Breathing better but back quite back to baseline. She still has rhinitis and PND. She is using her albuterol inhaler 8-10 x per wk.  She rarely uses her neb.   Dyspnea: .MMRC3 = can't walk 100 yards even at a slow pace at a flat grade s stopping due to sob  On RA or  3lpm  - not monitoring sats as rec  Cough: none  Sleeping: ok 1pillow / bed flat SABA use: as above  02: 2lpm hs / prn daytime  rec Prednisone 10 mg take  4 each am x 2 days,   2 each am x 2 days,  1 each am x 2 days and stop  - add  No 02 needed at rest, max 02 when walking more than room to room at home to prevent desats ENT eval  > byers saw 12/07/17 dx ? 02 irritation     12/27/17 phone care for flair of nasal congestion purulent bronchitis: Augmentin 875 mg take one pill twice daily  X 10 days  Prednisone 10 mg take  4 each am x 2 days,   2 each am x 2  days,  1 each am x 2 days and stop     01/11/2018  f/u ov/Suzanne Dickson re: gold III copd / still struggling with how/ when to use 02 which is irritating her nose Chief Complaint  Patient presents with  . Follow-up    COPD GOLD III/O2 dep  Dyspnea:  MMRC4  = sob if tries to leave home or while getting dressed   Cough: x 2 days hs coughing fits  Sleeping: bed flat, 1-2 pillow  SABA use: on prednisone no need 02: 2lpm hs not humidified   rec Need to humidify your home 02 per [employer] Goal for your 02 is to keep saturations over 90 so don't use it at rest unless you need  Elevate the head of your bed on 6-8 inch blocks For cough > mucinex dm up to 1200 mg every 12 hours as needed If get worse >  Prednisone 10 mg take  4 each am x 2 days,   2 each am x 2 days,  1 each am x 2 days and stop      02/17/2018  f/u ov/Suzanne Dickson re: gold III criteria but 02 dep, doing better with nasal symptoms on humidified 02  Chief Complaint  Patient presents with  . Follow-up    Breathing is some better. She has some nasal congestion in the am- clear, bloody nasal d/c. She is using her proair 2-3 x per wk on average.   Dyspnea:  Better vs priors/ pred helped nasal symptoms a lot and also doe  Cough: no Sleeping: 2 pillows/ bed blocks on order  SABA use: a little more since stopped prednisone  02: 2lpm 24/7 but up to 4 active rec Need to humidify your home 02 per [employer] Goal for your 02 is to keep saturations over 90 so don't use it at rest unless you need  Elevate the head of your bed on 6-8 inch blocks For cough > mucinex dm up to 1200 mg every 12 hours as needed If get worse >  Prednisone 10 mg take  4 each am x 2 days,   2 each am x 2 days,  1 each am x 2 days  and stop      05/08/2018  Phone care: Reason for call: Pt assumes her symptoms are allergy related. Has mild cough no mucus, drainage, eye burning, R ear itching, nasal dripping, wheezing at night and some SOB w/ rest & exertion starting 3wk ago. Pt  normally on 2L O2 at rest, but has had to increase to 3L recently. Uses 4L w/ extertion. Pt is currently taking Mucinex 2 times daily and a nasal spray once daily prescribed by ENT doctor. She uses her rescue inhaler q4h some days and rarely on others. Currently taking Trelegy. States she lost her oximeter recently, but feels like her O2 sats have been lower than usual.    Virtual Visit via Telephone Note 05/08/2018   I connected with Aliesha G Wojtkiewicz on 05/08/18 at  430 pm by telephone and verified that I am speaking with the correct person using two identifiers.   I discussed the limitations, risks, security and privacy concerns of performing an evaluation and management service by telephone and the availability of in person appointments. I also discussed with the patient that there may be a patient responsible charge related to this service. The patient expressed understanding and agreed to proceed.   History of Present Illness: worse sob x 3 weeks GOLD III / 02  Mostly on 2lpm at rest/ 4lpm with activity / can't find her oximetry  maint on trelegy / prn saba  Dyspnea:  Room to room,  Albuterol hfa helps some, using only 3-4 puff per day  Cough: assoc with nasal congestion. Not using humdifier Sleeping: on wedge / does ok SABA use: as above 02: as above  Prednisone helps a lot last took early March   No obvious day to day or daytime variability or assoc excess/ purulent sputum or mucus plugs or hemoptysis or cp or chest tightness, subjective wheeze or overt sinus or hb symptoms.    Also denies any obvious fluctuation of symptoms with weather or environmental changes or other aggravating or alleviating factors except as outlined above.   Meds reviewed/ med reconciliation completed       Observations/Objective: Good phonation, speaking in full phrases/ no spont coughing    Assessment and Plan: See problem list for active a/p's   Follow Up Instructions: See avs for instructions  unique to this ov which includes revised/ updated med list     I discussed the assessment and treatment plan with the patient. The patient was provided an opportunity to ask questions and all were answered. The patient agreed with the plan and demonstrated an understanding of the instructions.   The patient was advised to call back or seek an in-person evaluation if the symptoms worsen or if the condition fails to improve as anticipated.  I provided 30  minutes of non-face-to-face time during this encounter.   Christinia Gully, MD

## 2018-05-19 ENCOUNTER — Ambulatory Visit: Payer: Medicare Other | Admitting: Internal Medicine

## 2018-08-08 ENCOUNTER — Encounter: Payer: Self-pay | Admitting: Internal Medicine

## 2018-08-08 ENCOUNTER — Other Ambulatory Visit: Payer: Self-pay

## 2018-08-08 ENCOUNTER — Ambulatory Visit (INDEPENDENT_AMBULATORY_CARE_PROVIDER_SITE_OTHER): Payer: Medicare Other | Admitting: Internal Medicine

## 2018-08-08 DIAGNOSIS — J449 Chronic obstructive pulmonary disease, unspecified: Secondary | ICD-10-CM | POA: Diagnosis not present

## 2018-08-08 DIAGNOSIS — J9611 Chronic respiratory failure with hypoxia: Secondary | ICD-10-CM

## 2018-08-08 MED ORDER — PANTOPRAZOLE SODIUM 40 MG PO TBEC: DELAYED_RELEASE_TABLET | ORAL | 3 refills | Status: DC

## 2018-08-08 NOTE — Patient Instructions (Signed)
Please schedule a follow up visit in 6 months but call sooner if needed  

## 2018-08-08 NOTE — Progress Notes (Signed)
Subjective:     Patient ID: Suzanne Dickson, female   DOB: [birth year]     MRN: [id number]    Brief patient profile:  38 yobf quit smoking on admit 09/02/14  and proved to have GOLD III copd 11/2014     History of Present Illness  09/22/2015  f/u ov/[name] re: copd III/ symb 1602bid on 2lpm at rest/ 4lpm ex  Chief Complaint  Patient presents with  . Follow-up    [place] had hemoptysis and increased SOB. She took round pred and augmentin and symptoms are some better. She has noticed minimal wheezing at night. She has used neb 3 x over the past 2 wks.   liked bevespi better but wasn't covered then and not doing as well on the symbicort though hfa not ideal  - see a/p rec Plan A = Automatic =stop symbicort and restart Bevespi Take 2 puffs first thing in am and then another 2 puffs about 12 hours later.  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 Acute Visit    Breathing has been worse x 2 wks. She has been producing some yellow sputum over the past 2 days. She states also wheezing. She has been using her albuterol inhaler 3 x per day and she has not used neb.   Dyspnea:  MMRC3 = baseline can't walk 100 yards even at a slow pace at a flat grade s stopping due to sob on 2lpm but sometimes on RA now worse 2 weeks assoc with watery rhinitis no better with zyrtec  Cough:  more productive x 2 weeks assoc with pnds but min yellow  Sleeping: 2lpm  Bed flat and 2 big pillows  SABA use: not usually needing but now tid  02: 2lpm baseline now up to 3lpm  rec Prednisone 10 mg take  4 each am x 2 days,   2 each am x 2 days,  1 each am x 2 days and stop  Plan A = Automatic = symbicort 160 Take 2 puffs first thing in am and then another 2 puffs about 12 hours later.  Plan B = Backup Only use your albuterol as a rescue medication Plan C = Crisis - only use your albuterol nebulizer if you first try Plan B and it fails to help > ok to use the nebulizer up to every 4 hours but if start needing it regularly call for immediate appointment Goal for 02 is to keep saturation over 90%  Please schedule a follow up office visit in 4 weeks, sooner if needed  with all medications /inhalers/ solutions in hand so we can verify exactly what you are taking. This includes all medications from all doctors and over the counters    11/16/2017  f/u  ov/[name] re: copd III criteria but 02 dep/ did not bring all meds as req  Chief Complaint  Patient presents with  . Follow-up    Breathing better but back quite back to baseline. She still has rhinitis and PND. She is using her albuterol inhaler 8-10 x per wk.  She rarely uses her neb.   Dyspnea: .MMRC3 = can't walk 100 yards even at a slow pace at a flat grade s stopping due to sob  On RA or  3lpm  - not monitoring sats as rec  Cough: none  Sleeping: ok 1pillow / bed flat SABA use: as above  02: 2lpm hs / prn daytime  rec Prednisone 10 mg take  4 each am x 2 days,   2 each am x 2 days,  1 each am x 2 days and stop  - add  No 02 needed at rest, max 02 when walking more than room to room at home to prevent desats ENT eval  > byers saw 12/07/17 dx ? 02 irritation     12/27/17 phone care for flair of nasal congestion purulent bronchitis: Augmentin 875 mg take one pill twice daily  X 10 days  Prednisone 10 mg take  4 each am x 2 days,   2 each  am x 2 days,  1 each am x 2 days and stop     01/11/2018  f/u ov/[name] re: gold III copd / still struggling with how/ when to use 02 which is irritating her nose Chief Complaint  Patient presents with  . Follow-up    COPD GOLD III/O2 dep  Dyspnea:  MMRC4  = sob if tries to leave home or while getting dressed   Cough: x 2 days hs coughing fits  Sleeping: bed flat, 1-2 pillow  SABA use: on prednisone no need 02: 2lpm hs not humidified   rec Need to humidify your home 02 per [employer] Goal for your 02 is to keep saturations over 90 so don't use it at rest unless you need  Elevate the head of your bed on 6-8 inch blocks For cough > mucinex dm up to 1200 mg every 12 hours as needed If get worse >  Prednisone 10 mg take  4 each am x 2 days,   2 each am x 2 days,  1 each am x 2 days and stop      02/17/2018  f/u ov/[name] re: gold III criteria but 02 dep, doing better with nasal symptoms on humidified 02  Chief Complaint  Patient presents with  . Follow-up    Breathing is some better. She has some nasal congestion in the am- clear, bloody nasal d/c. She is using her proair 2-3 x per wk on average.   Dyspnea:  Better vs priors/ pred helped nasal symptoms a lot and also doe  Cough: no Sleeping: 2 pillows/ bed blocks on order  SABA use: a little more since stopped prednisone  02: 2lpm 24/7 but up to 4 active rec Plan A = Automatic = Trelegy one click first thing each am Plan B = Backup Only use your albuterol as a rescue medication Plan C = Crisis - only use your albuterol nebulizer if you first try Plan B and it fails to help > ok to use the nebulizer up to every 4 hours but if start needing it regularly call for immediate appointmentPlan D = Deltasone= Prednisone - if needing the nebulizer more than twice daily then start prednisone :  Prednisone 10 mg take  4 each am x 2 days,   2 each am x 2 days,  1 each am x 2 days and stop   Use humdified 02 and adjust to sats overy 90% during the day  2 lpm sitting  Sleeping and 4lpm        08/08/2018  f/u ov/[name] re:  GOLD II but 02 dep  Chief Complaint  Patient presents with  . Follow-up    Breathing has overall doing well.  She uses the albuterol on average about 10 x per wk and rarely uses neb.   Dyspnea:  MMRC2 = can't walk a nl pace on a flat grade s sob but does fine slow and flat walking around house x 10 min on 4lpm  Cough: none Sleeping: bed wedge/ one pillow  SABA use: less often since trelgy 02: 2lpm sleeping and 4 lpm and sats are    No obvious day to day or daytime variability or assoc excess/ purulent sputum or mucus plugs or hemoptysis or cp or chest tightness, subjective wheeze or overt sinus or hb symptoms.   Sleeping as above without nocturnal  or early am exacerbation  of respiratory  c/o's or need for noct saba. Also denies any obvious fluctuation of symptoms with weather or environmental changes or other aggravating or alleviating factors except as outlined above   No unusual exposure hx or h/o childhood pna/ asthma or knowledge of premature birth.  Current Allergies, Complete Past Medical History, Past Surgical History, Family History, and Social History were reviewed in [employer] record.  ROS  The following are not active complaints unless bolded Hoarseness, sore throat, dysphagia, dental problems, itching, sneezing,  nasal congestion or discharge of excess mucus or purulent secretions, ear ache,   fever, chills, sweats, unintended wt loss or wt gain, classically pleuritic or exertional cp,  orthopnea pnd or arm/hand swelling  or leg swelling, presyncope, palpitations, abdominal pain, anorexia, nausea, vomiting, diarrhea  or change in bowel habits or change in bladder habits, change in stools or change in urine, dysuria, hematuria,  rash, arthralgias, visual complaints, headache, numbness, weakness or ataxia or problems with walking or coordination,  change in mood or  memory.         Current Meds  Medication Sig  . albuterol (PROVENTIL HFA;VENTOLIN HFA) 108 (90 Base) MCG/ACT inhaler INHALE 2 PUFFS INTO THE LUNGS EVERY 4 HOURS AS NEEDED FOR WHEEZING OR SHORTNESS OF BREATH  . albuterol (PROVENTIL) (2.5 MG/3ML) 0.083% nebulizer solution Take 3 mLs (2.5 mg total) by nebulization every 4 (four) hours as needed. For shortness of breath (Patient taking differently: Take 2.5 mg by nebulization every 4 (four) hours as needed for wheezing or shortness of breath. )  . amLODipine (NORVASC) 5 MG tablet Take 1 tablet (5 mg total) by mouth daily.  Marland Kitchen aspirin 81 MG tablet Take 81 mg by mouth daily.  . clopidogrel (PLAVIX) 75 MG tablet Take 75 mg by mouth daily.  . diphenhydrAMINE (SOMINEX) 25 MG tablet Take 25 mg by mouth at bedtime as needed for sleep.  . ferrous sulfate 325 (65 FE) MG EC tablet Take 325 mg by mouth 3 (three) times daily with meals.  . Fluticasone-Umeclidin-Vilant (TRELEGY ELLIPTA) 100-62.5-25 MCG/INH AEPB Inhale 1 puff into the lungs daily.  Marland Kitchen guaiFENesin (MUCINEX) 600 MG 12 hr tablet Take 600 mg by mouth daily as needed (for allergies).   . losartan (COZAAR) 25 MG tablet Take 25 mg by mouth daily.  Marland Kitchen  metoprolol succinate (TOPROL-XL) 25 MG 24 hr tablet Take 25 mg by mouth daily.  . mometasone (NASONEX) 50 MCG/ACT nasal spray Place into the nose.  . montelukast (SINGULAIR) 10 MG tablet Take by mouth.  . nitroGLYCERIN (NITRODUR - DOSED IN MG/24 HR) 0.4 mg/hr patch 0.4 mg.  . nitroGLYCERIN (NITROSTAT) 0.4 MG SL tablet Place 0.4 mg under the tongue every 5 (five) minutes as needed for chest pain.  . OXYGEN 2lpm with sleep and 4 lpm with exertion  . pantoprazole (PROTONIX) 40 MG tablet TAKE 1 TABLET BY MOUTH ONCE DAILY 30-60 MINS BEFORE FIRST MEAL OF THE DAY  . predniSONE (DELTASONE) 10 MG tablet Take  4 each am x 2 days,   2 each am x 2 days,  1 each am x 2 days and stop  . sertraline (ZOLOFT) 100 MG tablet Take 100 mg by mouth 2 (two) times daily.             Objective:   Physical Exam  Pleasant amb bf nad  08/08/2018   154 10/28/2014        160 > 12/09/2014 161 > 03/14/2015 158 >    04/25/2015  161 >  06/20/2015  158 > 09/22/2015   159 > 11/19/2015 159 > 12/23/2015   159  > 02/03/2016    163  > 05/18/2016   161 > 11/17/2016   154 > 01/07/2017  157 > 04/18/2017   148 > 07/19/2017  145 > 10/19/2017    150  > 11/16/2017   150 >  01/11/2018   150> 02/17/2018  150     10/10/14 160 lb (72.576 kg)  09/16/14 156 lb (70.761 kg)  09/04/14 160 lb 13.6 oz (72.96 kg)     Vital signs reviewed - Note on arrival 02 sats  96% on 2lpm    HEENT: nl dentition / oropharynx. Nl external ear canals without cough reflex -  Mild bilateral non-specific turbinate edema     NECK :  without JVD/Nodes/TM/ nl carotid upstrokes bilaterally   LUNGS: no acc muscle use,  Mild barrel  contour chest wall with bilateral  Distant bs s audible wheeze and  without cough on insp or exp maneuver and mild  Hyperresonant  to  percussion bilaterally     CV:  RRR  no s3 or murmur or increase in P2, and no edema   ABD:  soft and nontender with pos end  insp Hoover's  in the supine position. No bruits or organomegaly appreciated, bowel sounds nl  MS:   Nl gait/  ext warm without deformities, calf tenderness, cyanosis or clubbing No obvious joint restrictions   SKIN: warm and dry without lesions    NEURO:  alert, approp, nl sensorium with  no motor or cerebellar deficits apparent.                Assessment:

## 2018-08-13 ENCOUNTER — Encounter: Payer: Self-pay | Admitting: Internal Medicine

## 2018-08-13 NOTE — Assessment & Plan Note (Addendum)
Started on 02 2lpm at d/c 09/04/14  - 10/28/2014   Walked RA x one lap @ 185 stopped due to  desat to 82%  at nl pace so rec 02 2lpm with walking and sleeping  - 12/09/2014 referred for POC > did not qualify - 08/17/2016 Patient Saturations on Room Air at Rest = 85%---increased to 93% on 2lpm o2 - 04/18/2017  Walking across exam room sats dropped into 80's on 2lpm  - 11/16/2017   Walked RA x one lap @ 185 stopped due to 87% and even on 5lpm could not maintain sats at nl pace  -  01/11/2018 humidfy 02  Added > not using as of 05/08/2018 > rec she do so > improved on humidity   Adequate control on present rx, reviewed in detail with pt > no change in rx needed    Each maintenance medication was reviewed in detail including most importantly the difference between maintenance and as needed and under what circumstances the prns are to be used.  Please see AVS for specific  Instructions which are unique to this visit and I personally typed out  which were reviewed in detail in writing with the patient and a copy provided.

## 2018-08-13 NOTE — Assessment & Plan Note (Signed)
Quit smoking 08/2014 - PFT's 12/09/14   FEV1 0.75 (44 % ) ratio 62  p no % improvement from saba p ? prior to study with DLCO  24 % corrects to 48 % for alv volume    - 12/09/2014 referred to rehab > started 12/2014  - 04/25/2015   try BEVESPI  - 06/20/2015  Changed by insurance> back on symb 160  - 09/22/2015 not happy with symbicort > changed back to besvespi 2bid > not happy with bevespi > changed back to symb 160 2bid and consider adding spiriva next vs trelegy trial   - 02/17/2018  After extensive coaching inhaler device,  effectiveness =    50% with hfa and 90% with elipta so rec changed to trelegy   - 05/08/2018 added pred x 6 days as "plan D"   Adequate control on present rx, reviewed in detail with pt > no change in rx needed

## 2018-08-25 ENCOUNTER — Encounter (HOSPITAL_COMMUNITY): Payer: Self-pay | Admitting: Radiology

## 2018-08-25 ENCOUNTER — Other Ambulatory Visit: Payer: Self-pay

## 2018-08-25 ENCOUNTER — Ambulatory Visit (HOSPITAL_COMMUNITY)
Admission: RE | Admit: 2018-08-25 | Discharge: 2018-08-25 | Disposition: A | Discharge status: Home / Self Care | Payer: Medicare Other | Source: Ambulatory Visit | Attending: Internal Medicine | Admitting: Internal Medicine

## 2018-08-25 ENCOUNTER — Inpatient Hospital Stay: Payer: Medicare Other | Attending: Internal Medicine

## 2018-08-25 DIAGNOSIS — C3490 Malignant neoplasm of unspecified part of unspecified bronchus or lung: Secondary | ICD-10-CM | POA: Diagnosis present

## 2018-08-25 LAB — CMP (CANCER CENTER ONLY)
ALT: 40 U/L (ref 0–44)
AST: 32 U/L (ref 15–41)
Albumin: 4.3 g/dL (ref 3.5–5.0)
Alkaline Phosphatase: 82 U/L (ref 38–126)
Anion gap: 9 (ref 5–15)
BUN: 37 mg/dL — ABNORMAL HIGH (ref 8–23)
CO2: 24 mmol/L (ref 22–32)
Calcium: 10 mg/dL (ref 8.9–10.3)
Chloride: 111 mmol/L (ref 98–111)
Creatinine: 1.41 mg/dL — ABNORMAL HIGH (ref 0.44–1.00)
GFR, Est AFR Am: 43 mL/min — ABNORMAL LOW (ref >60)
GFR, Estimated: 37 mL/min — ABNORMAL LOW (ref >60)
Glucose, Bld: 130 mg/dL — ABNORMAL HIGH (ref 70–99)
Potassium: 5.3 mmol/L — ABNORMAL HIGH (ref 3.5–5.1)
Sodium: 144 mmol/L (ref 135–145)
Total Bilirubin: 0.4 mg/dL (ref 0.3–1.2)
Total Protein: 8 g/dL (ref 6.5–8.1)

## 2018-08-25 LAB — CBC WITH DIFFERENTIAL (CANCER CENTER ONLY)
Abs Immature Granulocytes: 0.22 10*3/uL — ABNORMAL HIGH (ref 0.00–0.07)
Basophils Absolute: 0 10*3/uL (ref 0.0–0.1)
Basophils Relative: 0 %
Eosinophils Absolute: 0.1 10*3/uL (ref 0.0–0.5)
Eosinophils Relative: 1 %
HCT: 36.9 % (ref 36.0–46.0)
Hemoglobin: 11 g/dL — ABNORMAL LOW (ref 12.0–15.0)
Immature Granulocytes: 2 %
Lymphocytes Relative: 11 %
Lymphs Abs: 1.3 10*3/uL (ref 0.7–4.0)
MCH: 28.6 pg (ref 26.0–34.0)
MCHC: 29.8 g/dL — ABNORMAL LOW (ref 30.0–36.0)
MCV: 96.1 fL (ref 80.0–100.0)
Monocytes Absolute: 0.3 10*3/uL (ref 0.1–1.0)
Monocytes Relative: 3 %
Neutro Abs: 9.5 10*3/uL — ABNORMAL HIGH (ref 1.7–7.7)
Neutrophils Relative %: 83 %
Platelet Count: 188 10*3/uL (ref 150–400)
RBC: 3.84 MIL/uL — ABNORMAL LOW (ref 3.87–5.11)
RDW: 13.4 % (ref 11.5–15.5)
WBC Count: 11.4 10*3/uL — ABNORMAL HIGH (ref 4.0–10.5)
nRBC: 0 % (ref 0.0–0.2)

## 2018-08-25 MED ORDER — SODIUM CHLORIDE (PF) 0.9 % IJ SOLN
INTRAMUSCULAR | Status: AC
  Filled 2018-08-25: qty 50

## 2018-08-25 MED ORDER — IOHEXOL 300 MG/ML  SOLN
75.0000 mL | Freq: Once | INTRAMUSCULAR | Status: AC | PRN
  Administered 2018-08-25: 75 mL via INTRAVENOUS

## 2018-08-28 ENCOUNTER — Telehealth: Payer: Self-pay | Admitting: Medical Oncology

## 2018-08-28 ENCOUNTER — Encounter: Payer: Self-pay | Admitting: Internal Medicine

## 2018-08-28 ENCOUNTER — Inpatient Hospital Stay: Payer: Medicare Other | Attending: Internal Medicine | Admitting: Internal Medicine

## 2018-08-28 DIAGNOSIS — C3492 Malignant neoplasm of unspecified part of left bronchus or lung: Secondary | ICD-10-CM | POA: Diagnosis not present

## 2018-08-28 DIAGNOSIS — J449 Chronic obstructive pulmonary disease, unspecified: Secondary | ICD-10-CM | POA: Diagnosis not present

## 2018-08-28 DIAGNOSIS — C3490 Malignant neoplasm of unspecified part of unspecified bronchus or lung: Secondary | ICD-10-CM | POA: Diagnosis not present

## 2018-08-28 NOTE — Telephone Encounter (Signed)
Request phone visit or r/s . Suzanne Dickson will call pt today .

## 2018-08-28 NOTE — Progress Notes (Signed)
[place] [phone]   [phone]  PROGRESS NOTE FOR TELEMEDICINE VISITS  [name], MD [place] [address]  I connected with@ on 08/28/18 at  3:15 PM EDT by telephone visit and verified that I am speaking with the correct person using two identifiers.   I discussed the limitations, risks, security and privacy concerns of performing an evaluation and management service by telemedicine and the availability of in-person appointments. I also discussed with the patient that there may be a patient responsible charge related to this service. The patient expressed understanding and agreed to proceed.  Other persons participating in the visit and their role in the encounter:  None.  Patient's location:  Home Provider's location:  [place]  DIAGNOSIS: Stage IA (T1a, N0, M0) non-small cell lung cancer, adenocarcinoma presented with left upper lobe lung nodule diagnosed in March 2018.  PRIOR THERAPY: S/P RT to the left lung nodule under the care of Dr. Sondra Come completed on 05/04/2016.  CURRENT THERAPY: Observation.  INTERVAL HISTORY: Suzanne Dickson 73 y.o. female has a telephone virtual visit with me today for evaluation and discussion of her scan results.  The patient is feeling fine today with no concerning complaints except for the baseline shortness of breath secondary to COPD.  She denied having any chest pain, cough or hemoptysis.  She denied having any nausea, vomiting, diarrhea or constipation.  She has no headache or visual changes.  She has no change in her medications or allergy list.  The patient had repeat CT scan of the chest performed recently and we are having the visit for evaluation and discussion of her scan results.  MEDICAL HISTORY: Past Medical History:  Diagnosis Date  . Adenocarcinoma of left lung, stage 1 ([facility]) 04/15/2016   "I'm in remission" (04/05/2017)  . Anxiety   .  Arthritis    "legs, elbows" (04/05/2017)  . Bronchitis, chronic ([place] [facility])    "haven't had it in awhile" (04/05/2017)  . CHF (congestive heart failure) ([facility])   . COPD (chronic obstructive pulmonary disease) ([facility])   . Daily headache    "recently" (04/05/2017)  . Depression   . Dyspnea    with exertion  . GERD (gastroesophageal reflux disease)   . History of blood transfusion 1950s  . History of radiation therapy 05/04/16 - 05/12/16   Left lung treated to 54 Gy with 3 fx of 18 Gy  . Hypertension   . On home oxygen therapy    "2L; 24/7" (04/05/2017)  . OSA on CPAP   . Pneumonia 10/2015   "at least 3 times" (04/05/2017)    ALLERGIES:  has No Known Allergies.  MEDICATIONS:  Current Outpatient Medications  Medication Sig Dispense Refill  . albuterol (PROVENTIL HFA;VENTOLIN HFA) 108 (90 Base) MCG/ACT inhaler INHALE 2 PUFFS INTO THE LUNGS EVERY 4 HOURS AS NEEDED FOR WHEEZING OR SHORTNESS OF BREATH 18 g 5  . albuterol (PROVENTIL) (2.5 MG/3ML) 0.083% nebulizer solution Take 3 mLs (2.5 mg total) by nebulization every 4 (four) hours as needed. For shortness of breath (Patient taking differently: Take 2.5 mg by nebulization every 4 (four) hours as needed for wheezing or shortness of breath. ) 75 mL 12  . amLODipine (NORVASC) 5 MG tablet Take 1 tablet (5 mg total) by mouth daily. 30 tablet 3  . aspirin 81 MG tablet Take 81 mg by mouth daily.    . clopidogrel (PLAVIX) 75 MG tablet Take 75 mg by mouth  daily.    . diphenhydrAMINE (SOMINEX) 25 MG tablet Take 25 mg by mouth at bedtime as needed for sleep.    . ferrous sulfate 325 (65 FE) MG EC tablet Take 325 mg by mouth 3 (three) times daily with meals.    . Fluticasone-Umeclidin-Vilant (TRELEGY ELLIPTA) 100-62.5-25 MCG/INH AEPB Inhale 1 puff into the lungs daily. 60 each 11  . guaiFENesin (MUCINEX) 600 MG 12 hr tablet Take 600 mg by mouth daily as needed (for allergies).     . losartan (COZAAR) 25 MG tablet Take 25 mg by mouth daily.    . metoprolol  succinate (TOPROL-XL) 25 MG 24 hr tablet Take 25 mg by mouth daily.    . mometasone (NASONEX) 50 MCG/ACT nasal spray Place into the nose.    . montelukast (SINGULAIR) 10 MG tablet Take by mouth.    . nitroGLYCERIN (NITRODUR - DOSED IN MG/24 HR) 0.4 mg/hr patch 0.4 mg.  3  . nitroGLYCERIN (NITROSTAT) 0.4 MG SL tablet Place 0.4 mg under the tongue every 5 (five) minutes as needed for chest pain.    . OXYGEN 2lpm with sleep and 4 lpm with exertion    . pantoprazole (PROTONIX) 40 MG tablet Take 30-60 min before first meal of the day 90 tablet 3  . predniSONE (DELTASONE) 10 MG tablet Take  4 each am x 2 days,   2 each am x 2 days,  1 each am x 2 days and stop 14 tablet 11  . sertraline (ZOLOFT) 100 MG tablet Take 100 mg by mouth 2 (two) times daily.  0   No current facility-administered medications for this visit.     SURGICAL HISTORY:  Past Surgical History:  Procedure Laterality Date  . COLONOSCOPY    . CORONARY ANGIOPLASTY WITH STENT PLACEMENT  04/05/2017  . CORONARY STENT INTERVENTION N/A 04/05/2017   Procedure: CORONARY STENT INTERVENTION;  Surgeon: [name], MD;  Location: [facility];  Service: Cardiovascular;  Laterality: N/A;  . DILATION AND CURETTAGE OF UTERUS    . FUDUCIAL PLACEMENT Left 03/31/2016   Procedure: PLACEMENT OF FUDUCIAL LEFT UPPER LOBE;  Surgeon: Collene Gobble, MD;  Location: [facility];  Service: Thoracic;  Laterality: Left;  . LAPAROSCOPIC CHOLECYSTECTOMY    . LEFT HEART CATH AND CORONARY ANGIOGRAPHY N/A 02/17/2017   Procedure: LEFT HEART CATH AND CORONARY ANGIOGRAPHY;  Surgeon: [name], MD;  Location: [facility];  Service: Cardiovascular;  Laterality: N/A;  . SINUSOTOMY    . TONSILLECTOMY    . TUBAL LIGATION    . VIDEO BRONCHOSCOPY WITH ENDOBRONCHIAL NAVIGATION N/A 03/31/2016   Procedure: VIDEO BRONCHOSCOPY WITH ENDOBRONCHIAL NAVIGATION;  Surgeon: Collene Gobble, MD;  Location: [facility];  Service: Thoracic;  Laterality: N/A;    REVIEW OF SYSTEMS:   A comprehensive review of systems was negative except for: Respiratory: positive for dyspnea on exertion    LABORATORY DATA: Lab Results  Component Value Date   WBC 11.4 (H) 08/25/2018   HGB 11.0 (L) 08/25/2018   HCT 36.9 08/25/2018   MCV 96.1 08/25/2018   PLT 188 08/25/2018      Chemistry      Component Value Date/Time   NA 144 08/25/2018 1315   NA 141 08/13/2016 1120   K 5.3 (H) 08/25/2018 1315   K 3.4 (L) 08/13/2016 1120   CL 111 08/25/2018 1315   CO2 24 08/25/2018 1315   CO2 25 08/13/2016 1120   BUN 37 (H) 08/25/2018 1315   BUN 15.8  08/13/2016 1120   CREATININE 1.41 (H) 08/25/2018 1315   CREATININE 1.1 08/13/2016 1120      Component Value Date/Time   CALCIUM 10.0 08/25/2018 1315   CALCIUM 9.1 08/13/2016 1120   ALKPHOS 82 08/25/2018 1315   ALKPHOS 68 08/13/2016 1120   AST 32 08/25/2018 1315   AST 20 08/13/2016 1120   ALT 40 08/25/2018 1315   ALT 14 08/13/2016 1120   BILITOT 0.4 08/25/2018 1315   BILITOT 0.47 08/13/2016 1120       RADIOGRAPHIC STUDIES: Ct Chest W Contrast  Result Date: 08/25/2018 CLINICAL DATA:  73 year old female with history of left-sided lung cancer diagnosed in January 2018 treated with radiation therapy only. Follow-up study. EXAM: CT CHEST WITH CONTRAST TECHNIQUE: Multidetector CT imaging of the chest was performed during intravenous contrast administration. CONTRAST:  19mL OMNIPAQUE IOHEXOL 300 MG/ML  SOLN COMPARISON:  Chest x-ray 02/20/2018. FINDINGS: Cardiovascular: Heart size is normal. There is no significant pericardial fluid, thickening or pericardial calcification. There is aortic atherosclerosis, as well as atherosclerosis of the great vessels of the mediastinum and the coronary arteries, including calcified atherosclerotic plaque in the left main, left anterior descending, left circumflex and right coronary arteries. Mediastinum/Nodes: No pathologically enlarged mediastinal or hilar lymph nodes. Esophagus is unremarkable in appearance.  No axillary lymphadenopathy. Low-attenuation nodule in the right lobe of the thyroid gland measuring 2.0 x 1.4 cm, similar to prior examinations. Lungs/Pleura: Persistent septal thickening and architectural distortion in the left mid lung involving portions of both the left upper and lower lobe, similar to prior examinations, most compatible with postradiation changes. Fiducial markers are noted in the left upper lobe. There is a new linear area of architectural distortion in the anterior aspect of the right upper lobe, likely to reflect an evolving post infectious or inflammatory area of scarring (axial image 76 of series 5). New 4 mm right lower lobe nodule (axial image 92 of series 5), nonspecific. No other larger more suspicious appearing pulmonary nodules or masses are noted. Diffuse bronchial wall thickening with moderate centrilobular and paraseptal emphysema. No pleural effusions. Upper Abdomen: Aortic atherosclerosis. Status post cholecystectomy. Incompletely imaged low-attenuation lesion in the upper pole the right kidney measuring at least 5 cm in diameter, not characterized due to incomplete visualization, but statistically likely to represent a cyst. Musculoskeletal: There are no aggressive appearing lytic or blastic lesions noted in the visualized portions of the skeleton. IMPRESSION: 1. Stable post treatment changes of radiation therapy in the left lung without findings to suggest local recurrence of disease or definite metastatic disease in the thorax. 2. New 4 mm right lower lobe pulmonary nodule, nonspecific. Attention at time of follow-up imaging is recommended. 3. New area of architectural distortion in the anterior aspect of the right upper lobe likely post infectious or inflammatory scarring. Attention at time of follow-up imaging is recommended. 4. Diffuse bronchial wall thickening with moderate centrilobular and paraseptal emphysema; imaging findings suggestive of underlying COPD. 5. Aortic  atherosclerosis, in addition to left main and 3 vessel coronary artery disease. Assessment for potential risk factor modification, dietary therapy or pharmacologic therapy may be warranted, if clinically indicated. 6. Additional incidental findings, as above. Aortic Atherosclerosis (ICD10-I70.0) and Emphysema (ICD10-J43.9). Electronically Signed   By: Vinnie Langton M.D.   On: 08/25/2018 16:02    ASSESSMENT AND PLAN: This is a very pleasant 73 years old white female with a stage IA non-small cell lung cancer, adenocarcinoma presented with left upper lobe lung nodule. The patient is status post  stereotactic radiotherapy to the left upper lobe lung nodule and she tolerated the procedure well.  He has been on observation since that time. The patient is feeling fine today with no concerning complaints. She had repeat CT scan of the chest performed recently.  I personally and independently reviewed the scans and discussed the results with the patient today. Her scan showed no concerning findings for disease progression or metastasis but there was nonspecific 4 mm right lower lobe lung nodules at need close observation on the upcoming imaging studies. I recommended for the patient to continue on observation with repeat CT scan of the chest in 6 months. She was advised to call immediately if she has any concerning symptoms in the interval. I discussed the assessment and treatment plan with the patient. The patient was provided an opportunity to ask questions and all were answered. The patient agreed with the plan and demonstrated an understanding of the instructions.   The patient was advised to call back or seek an in-person evaluation if the symptoms worsen or if the condition fails to improve as anticipated.  I provided 12 minutes of non face-to-face telephone visit time during this encounter, and > 50% was spent counseling as documented under my assessment & plan.  Suzanne Kempf, MD 08/28/2018 2:52  PM  Disclaimer: This note was dictated with voice recognition software. Similar sounding words can inadvertently be transcribed and may not be corrected upon review.

## 2018-08-29 ENCOUNTER — Telehealth: Payer: Self-pay | Admitting: Internal Medicine

## 2018-08-29 NOTE — Telephone Encounter (Signed)
Scheduled appt per 8/03 los - mailed letter with appt date and time

## 2018-10-06 ENCOUNTER — Ambulatory Visit (INDEPENDENT_AMBULATORY_CARE_PROVIDER_SITE_OTHER): Payer: Medicare Other | Admitting: Internal Medicine

## 2018-10-06 ENCOUNTER — Other Ambulatory Visit: Payer: Self-pay

## 2018-10-06 DIAGNOSIS — J9611 Chronic respiratory failure with hypoxia: Secondary | ICD-10-CM

## 2018-10-06 DIAGNOSIS — J449 Chronic obstructive pulmonary disease, unspecified: Secondary | ICD-10-CM | POA: Diagnosis not present

## 2018-10-06 NOTE — Progress Notes (Signed)
Subjective:     Patient ID: Suzanne Dickson, female   DOB: 1945/02/21     MRN: [id number]    Brief patient profile:  64 yobf quit smoking on admit 09/02/14  and proved to have GOLD III copd 11/2014     History of Present Illness  09/22/2015  f/u ov/Verda Mehta re: copd III/ symb 1602bid on 2lpm at rest/ 4lpm ex  Chief Complaint  Patient presents with  . Follow-up    [place] had hemoptysis and increased SOB. She took round pred and augmentin and symptoms are some better. She has noticed minimal wheezing at night. She has used neb 3 x over the past 2 wks.   liked bevespi better but wasn't covered then and not doing as well on the symbicort though hfa not ideal  - see a/p rec Plan A = Automatic =stop symbicort and restart Bevespi Take 2 puffs first thing in am and then another 2 puffs about 12 hours later.  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 > referred to Dr Lamonte Sakai 02/24/2016 >>>  FOB tbbx 03/31/16 Pos Adenoca > RT   10/19/2017  Acute extended  ov/Madiline Saffran re:  COPD III/ 02 dep 2lpm / [employer] formulary for review Chief Complaint  Patient presents with  . Acute Visit    Breathing has been worse x 2 wks. She has been producing some yellow sputum over the past 2 days. She states also wheezing. She has been using her albuterol inhaler 3 x per day and she has not used neb.   Dyspnea:  MMRC3 = baseline can't walk 100 yards even at a slow pace at a flat grade s stopping due to sob on 2lpm but sometimes on RA now worse 2 weeks assoc with watery rhinitis no better with zyrtec  Cough:  more productive x 2 weeks assoc with pnds but min yellow  Sleeping: 2lpm  Bed flat and 2 big pillows  SABA use: not usually needing but now tid  02: 2lpm baseline now up to 3lpm  rec Prednisone 10 mg take  4 each am x 2 days,   2 each am x 2 days,  1 each am x 2 days and stop  Plan A = Automatic = symbicort 160 Take 2 puffs first thing in am and then another 2 puffs about 12 hours later.  Plan B = Backup Only use your albuterol as a rescue medication Plan C = Crisis - only use your albuterol nebulizer if you first try Plan B and it fails to help > ok to use the nebulizer up to every 4 hours but if start needing it regularly call for immediate appointment Goal for 02 is to keep saturation over 90%  Please schedule a follow up office visit in 4 weeks, sooner if needed  with all medications /inhalers/ solutions in hand so we can verify exactly what you are taking. This includes all medications from all doctors and over the counters    11/16/2017  f/u  ov/Delando Satter re: copd III criteria but 02 dep/ did not bring all meds as req  Chief Complaint  Patient presents with  . Follow-up    Breathing better but back quite back to baseline. She still has rhinitis and PND. She is using her albuterol inhaler 8-10 x per wk.  She rarely uses her neb.   Dyspnea: .MMRC3 = can't walk 100 yards even at a slow pace at a flat grade s stopping due to sob  On RA or  3lpm  - not monitoring sats as rec  Cough: none  Sleeping: ok 1pillow / bed flat SABA use: as above  02: 2lpm hs / prn daytime  rec Prednisone 10 mg take  4 each am x 2 days,   2 each am x 2 days,  1 each am x 2 days and stop  - add  No 02 needed at rest, max 02 when walking more than room to room at home to prevent desats ENT eval  > [facility] saw 12/07/17 dx ? 02 irritation     12/27/17 phone care for flair of nasal congestion purulent bronchitis: Augmentin 875 mg take one pill twice daily  X 10 days  Prednisone 10 mg take  4 each am x 2 days,   2 each  am x 2 days,  1 each am x 2 days and stop     01/11/2018  f/u ov/Alireza Pollack re: gold III copd / still struggling with how/ when to use 02 which is irritating her nose Chief Complaint  Patient presents with  . Follow-up    COPD GOLD III/O2 dep  Dyspnea:  MMRC4  = sob if tries to leave home or while getting dressed   Cough: x 2 days hs coughing fits  Sleeping: bed flat, 1-2 pillow  SABA use: on prednisone no need 02: 2lpm hs not humidified   rec Need to humidify your home 02 per [employer] Goal for your 02 is to keep saturations over 90 so don't use it at rest unless you need  Elevate the head of your bed on 6-8 inch blocks For cough > mucinex dm up to 1200 mg every 12 hours as needed If get worse >  Prednisone 10 mg take  4 each am x 2 days,   2 each am x 2 days,  1 each am x 2 days and stop      02/17/2018  f/u ov/Gordan Grell re: gold III criteria but 02 dep, doing better with nasal symptoms on humidified 02  Chief Complaint  Patient presents with  . Follow-up    Breathing is some better. She has some nasal congestion in the am- clear, bloody nasal d/c. She is using her proair 2-3 x per wk on average.   Dyspnea:  Better vs priors/ pred helped nasal symptoms a lot and also doe  Cough: no Sleeping: 2 pillows/ bed blocks on order  SABA use: a little more since stopped prednisone  02: 2lpm 24/7 but up to 4 active rec Plan A = Automatic = Trelegy one click first thing each am Plan B = Backup Only use your albuterol as a rescue medication Plan C = Crisis - only use your albuterol nebulizer if you first try Plan B and it fails to help > ok to use the nebulizer up to every 4 hours but if start needing it regularly call for immediate appointmentPlan D = Deltasone= Prednisone - if needing the nebulizer more than twice daily then start prednisone :  Prednisone 10 mg take  4 each am x 2 days,   2 each am x 2 days,  1 each am x 2 days and stop   Use humdified 02 and adjust to sats overy 90% during the day  2 lpm sitting  Sleeping and 4lpm        08/08/2018  f/u ov/Rafi Kenneth re:  GOLD II but 02 dep  Chief Complaint  Patient presents with  . Follow-up    Breathing has overall doing well.  She uses the albuterol on average about 10 x per wk and rarely uses neb.   Dyspnea:  MMRC2 = can't walk a nl pace on a flat grade s sob but does fine slow and flat walking around house x 10 min on 4lpm  Cough: none Sleeping: bed wedge/ one pillow  SABA use: less often since trelgy 02: 2lpm sleeping and 4 lpm and sats are  rec No change rx    Virtual Visit via Telephone Note 10/06/2018  GOLD II/ 02 dep 2lpm /4lpm walking maint trelegy and pred as plan D but not understanding when to activate   I connected with Suzanne Dickson on 10/06/18 at  4pm  EDT by telephone and verified that I am speaking with the correct person using two identifiers.   I discussed the limitations, risks, security and privacy concerns of performing an evaluation and management service by telephone and the availability of in person appointments. I also discussed with the patient that there may be a patient responsible charge related to this service. The patient expressed understanding and agreed to proceed.   History of Present Illness: Dyspnea:  At her best can walk inside house with one flight of steps then once stops pred worse over the next week or two and increase need for neb  Despite maint trelegy  Cough: none at all now Sleeping: ok on wedge  SABA use: using first thing in am / no pred in sev months 02: 2lpm hs / 2 resting and 4lpm with activity    No obvious day to day or daytime variability or assoc excess/ purulent sputum or mucus plugs or hemoptysis or cp or chest tightness, subjective wheeze or overt sinus or hb symptoms.    Also denies any obvious fluctuation of symptoms with weather or environmental changes or other aggravating or alleviating factors except as outlined above.   Meds reviewed/ med reconciliation  completed          Observations/Objective: Sounds fine on the phone though talking in phrases, not full sentences/ does not sound congested on volutary cough and no hoarseness   Assessment and Plan: See problem list for active a/p's   Follow Up Instructions: See avs for instructions unique to this ov which includes revised/ updated med list     I discussed the assessment and treatment plan with the patient. The patient was provided an opportunity to ask questions and all were answered. The patient agreed with the plan and demonstrated an understanding of the instructions.   The patient was advised to call back or seek an in-person evaluation if the symptoms worsen or if the condition fails to improve as anticipated.  I provided 25  minutes of non-face-to-face time during this encounter.   Christinia Gully, MD

## 2018-10-07 ENCOUNTER — Encounter: Payer: Self-pay | Admitting: Internal Medicine

## 2018-10-07 NOTE — Assessment & Plan Note (Addendum)
Started on 02 2lpm at d/c 09/04/14  - 10/28/2014   Walked RA x one lap @ 185 stopped due to  desat to 82%  at nl pace so rec 02 2lpm with walking and sleeping  - 12/09/2014 referred for POC > did not qualify - 08/17/2016 Patient Saturations on Room Air at Rest = 85%---increased to 93% on 2lpm o2 - 04/18/2017  Walking across exam room sats dropped into 80's on 2lpm  - 11/16/2017   Walked RA x one lap @ 185 stopped due to 87% and even on 5lpm could not maintain sats at nl pace  -  01/11/2018 humidfy 02  Added > not using as of 05/08/2018 > rec she do so > improved on humidity  As of 10/06/2018  rx  2lpm hs, 2lpm resting, and up to 4 lpm with activity   rec check sats at peak intensity activity to be sure 4lpm adequate    Each maintenance medication was reviewed in detail including most importantly the difference between maintenance and as needed and under what circumstances the prns are to be used.  Please see AVS for specific  Instructions which are unique to this visit and I personally typed out  which were reviewed over the phone  in writing with the patient and a copy provided.

## 2018-10-07 NOTE — Assessment & Plan Note (Signed)
Quit smoking 08/2014 - PFT's 12/09/14   FEV1 0.75 (44 % ) ratio 62  p no % improvement from saba p ? prior to study with DLCO  24 % corrects to 48 % for alv volume    - 12/09/2014 referred to rehab > started 12/2014  - 04/25/2015   try BEVESPI  - 06/20/2015  Changed by insurance> back on symb 160  - 09/22/2015 not happy with symbicort > changed back to besvespi 2bid > not happy with bevespi > changed back to symb 160 2bid and consider adding spiriva next vs trelegy trial   - 02/17/2018  After extensive coaching inhaler device,  effectiveness =    50% with hfa and 90% with elipta so rec changed to trelegy   - 05/08/2018 added pred x 6 days as "plan D"   Group D in terms of symptom/risk and laba/lama/ICS  therefore appropriate rx at this point >>>  Continue trelegy and 'prn pred" if there is significant decline in activity tol enough to where starts relying more on neb/ reviewed

## 2018-10-07 NOTE — Patient Instructions (Signed)
Remember prednisone is refillable and can be used when you are losing ground with your breathing and using more of your nebulizer than usual as Plan D below   Plan A = Automatic = Trelegy one click first thing each am   Plan B = Backup Only use your albuterol as a rescue medication to be used if you can't catch your breath by resting or doing a relaxed purse lip breathing pattern.  - The less you use it, the better it will work when you need it. - Ok to use the inhaler up to 2 puffs  every 4 hours if you must but call for appointment if use goes up over your usual need - Don't leave home without it !!  (think of it like the spare tire for your car)   Plan C = Crisis - only use your albuterol nebulizer if you first try Plan B and it fails to help > ok to use the nebulizer up to every 4 hours but if start needing it regularly call for immediate appointment  Plan D = Deltasone= Prednisone - if needing the nebulizer more than twice daily then start prednisone : Prednisone 10 mg take  4 each am x 2 days,   2 each am x 2 days,  1 each am x 2 days and stop    Please schedule a follow up visit in 3 months but call sooner if needed  with all medications /inhalers/ solutions in hand so we can verify exactly what you are taking. This includes all medications from all doctors and over the counters

## 2018-11-24 ENCOUNTER — Observation Stay (HOSPITAL_COMMUNITY)
Admission: EM | Admit: 2018-11-24 | Discharge: 2018-11-25 | Disposition: A | Discharge status: Home / Self Care | Payer: Medicare Other | Attending: Family Medicine | Admitting: Family Medicine

## 2018-11-24 ENCOUNTER — Observation Stay (HOSPITAL_COMMUNITY): Payer: Medicare Other

## 2018-11-24 ENCOUNTER — Emergency Department (HOSPITAL_COMMUNITY): Payer: Medicare Other

## 2018-11-24 ENCOUNTER — Encounter (HOSPITAL_COMMUNITY): Payer: Self-pay | Admitting: Emergency Medicine

## 2018-11-24 ENCOUNTER — Other Ambulatory Visit: Payer: Self-pay

## 2018-11-24 DIAGNOSIS — Z79899 Other long term (current) drug therapy: Secondary | ICD-10-CM | POA: Diagnosis not present

## 2018-11-24 DIAGNOSIS — J9621 Acute and chronic respiratory failure with hypoxia: Principal | ICD-10-CM | POA: Diagnosis present

## 2018-11-24 DIAGNOSIS — K219 Gastro-esophageal reflux disease without esophagitis: Secondary | ICD-10-CM | POA: Diagnosis not present

## 2018-11-24 DIAGNOSIS — Z9981 Dependence on supplemental oxygen: Secondary | ICD-10-CM | POA: Diagnosis not present

## 2018-11-24 DIAGNOSIS — Z955 Presence of coronary angioplasty implant and graft: Secondary | ICD-10-CM | POA: Insufficient documentation

## 2018-11-24 DIAGNOSIS — Z7902 Long term (current) use of antithrombotics/antiplatelets: Secondary | ICD-10-CM | POA: Insufficient documentation

## 2018-11-24 DIAGNOSIS — Z7982 Long term (current) use of aspirin: Secondary | ICD-10-CM | POA: Insufficient documentation

## 2018-11-24 DIAGNOSIS — I700 Atherosclerosis of aorta: Secondary | ICD-10-CM | POA: Diagnosis not present

## 2018-11-24 DIAGNOSIS — D72829 Elevated white blood cell count, unspecified: Secondary | ICD-10-CM | POA: Diagnosis not present

## 2018-11-24 DIAGNOSIS — Z87891 Personal history of nicotine dependence: Secondary | ICD-10-CM | POA: Insufficient documentation

## 2018-11-24 DIAGNOSIS — J9691 Respiratory failure, unspecified with hypoxia: Secondary | ICD-10-CM | POA: Diagnosis present

## 2018-11-24 DIAGNOSIS — I2721 Secondary pulmonary arterial hypertension: Secondary | ICD-10-CM

## 2018-11-24 DIAGNOSIS — I2510 Atherosclerotic heart disease of native coronary artery without angina pectoris: Secondary | ICD-10-CM | POA: Insufficient documentation

## 2018-11-24 DIAGNOSIS — I10 Essential (primary) hypertension: Secondary | ICD-10-CM | POA: Diagnosis present

## 2018-11-24 DIAGNOSIS — D500 Iron deficiency anemia secondary to blood loss (chronic): Secondary | ICD-10-CM | POA: Diagnosis present

## 2018-11-24 DIAGNOSIS — F419 Anxiety disorder, unspecified: Secondary | ICD-10-CM | POA: Diagnosis not present

## 2018-11-24 DIAGNOSIS — G4733 Obstructive sleep apnea (adult) (pediatric): Secondary | ICD-10-CM | POA: Insufficient documentation

## 2018-11-24 DIAGNOSIS — M1990 Unspecified osteoarthritis, unspecified site: Secondary | ICD-10-CM | POA: Diagnosis not present

## 2018-11-24 DIAGNOSIS — I509 Heart failure, unspecified: Secondary | ICD-10-CM | POA: Insufficient documentation

## 2018-11-24 DIAGNOSIS — J441 Chronic obstructive pulmonary disease with (acute) exacerbation: Secondary | ICD-10-CM | POA: Diagnosis not present

## 2018-11-24 DIAGNOSIS — E041 Nontoxic single thyroid nodule: Secondary | ICD-10-CM | POA: Insufficient documentation

## 2018-11-24 DIAGNOSIS — I70219 Atherosclerosis of native arteries of extremities with intermittent claudication, unspecified extremity: Secondary | ICD-10-CM | POA: Diagnosis not present

## 2018-11-24 DIAGNOSIS — C3492 Malignant neoplasm of unspecified part of left bronchus or lung: Secondary | ICD-10-CM | POA: Diagnosis not present

## 2018-11-24 DIAGNOSIS — I110 Hypertensive heart disease with heart failure: Secondary | ICD-10-CM | POA: Insufficient documentation

## 2018-11-24 DIAGNOSIS — Z7951 Long term (current) use of inhaled steroids: Secondary | ICD-10-CM | POA: Insufficient documentation

## 2018-11-24 DIAGNOSIS — J9601 Acute respiratory failure with hypoxia: Secondary | ICD-10-CM

## 2018-11-24 DIAGNOSIS — Z20828 Contact with and (suspected) exposure to other viral communicable diseases: Secondary | ICD-10-CM | POA: Insufficient documentation

## 2018-11-24 DIAGNOSIS — F329 Major depressive disorder, single episode, unspecified: Secondary | ICD-10-CM | POA: Diagnosis not present

## 2018-11-24 DIAGNOSIS — R9431 Abnormal electrocardiogram [ECG] [EKG]: Secondary | ICD-10-CM | POA: Diagnosis present

## 2018-11-24 LAB — CBC WITH DIFFERENTIAL/PLATELET
Abs Immature Granulocytes: 0.08 10*3/uL — ABNORMAL HIGH (ref 0.00–0.07)
Basophils Absolute: 0 10*3/uL (ref 0.0–0.1)
Basophils Relative: 0 %
Eosinophils Absolute: 0.1 10*3/uL (ref 0.0–0.5)
Eosinophils Relative: 1 %
HCT: 28.7 % — ABNORMAL LOW (ref 36.0–46.0)
Hemoglobin: 8.3 g/dL — ABNORMAL LOW (ref 12.0–15.0)
Immature Granulocytes: 1 %
Lymphocytes Relative: 6 %
Lymphs Abs: 0.7 10*3/uL (ref 0.7–4.0)
MCH: 27.1 pg (ref 26.0–34.0)
MCHC: 28.9 g/dL — ABNORMAL LOW (ref 30.0–36.0)
MCV: 93.8 fL (ref 80.0–100.0)
Monocytes Absolute: 0.8 10*3/uL (ref 0.1–1.0)
Monocytes Relative: 7 %
Neutro Abs: 9 10*3/uL — ABNORMAL HIGH (ref 1.7–7.7)
Neutrophils Relative %: 85 %
Platelets: 178 10*3/uL (ref 150–400)
RBC: 3.06 MIL/uL — ABNORMAL LOW (ref 3.87–5.11)
RDW: 13.1 % (ref 11.5–15.5)
WBC: 10.6 10*3/uL — ABNORMAL HIGH (ref 4.0–10.5)
nRBC: 0 % (ref 0.0–0.2)

## 2018-11-24 LAB — BASIC METABOLIC PANEL
Anion gap: 8 (ref 5–15)
BUN: 20 mg/dL (ref 8–23)
CO2: 23 mmol/L (ref 22–32)
Calcium: 9 mg/dL (ref 8.9–10.3)
Chloride: 111 mmol/L (ref 98–111)
Creatinine, Ser: 0.98 mg/dL (ref 0.44–1.00)
GFR calc Af Amer: >60 mL/min (ref >60)
GFR calc non Af Amer: 58 mL/min — ABNORMAL LOW (ref >60)
Glucose, Bld: 111 mg/dL — ABNORMAL HIGH (ref 70–99)
Potassium: 3.8 mmol/L (ref 3.5–5.1)
Sodium: 142 mmol/L (ref 135–145)

## 2018-11-24 LAB — SARS CORONAVIRUS 2 (TAT 6-24 HRS): SARS Coronavirus 2: NEGATIVE

## 2018-11-24 LAB — TROPONIN I (HIGH SENSITIVITY): Troponin I (High Sensitivity): 17 ng/L (ref <18)

## 2018-11-24 LAB — TYPE AND SCREEN
ABO/RH(D): B POS
Antibody Screen: NEGATIVE

## 2018-11-24 LAB — HIV ANTIBODY (ROUTINE TESTING W REFLEX): HIV Screen 4th Generation wRfx: NONREACTIVE

## 2018-11-24 LAB — HEMOGLOBIN AND HEMATOCRIT, BLOOD
HCT: 28.2 % — ABNORMAL LOW (ref 36.0–46.0)
Hemoglobin: 8.1 g/dL — ABNORMAL LOW (ref 12.0–15.0)

## 2018-11-24 LAB — ABO/RH: ABO/RH(D): B POS

## 2018-11-24 MED ORDER — PREDNISONE 20 MG PO TABS: 40.0000 mg | ORAL_TABLET | Freq: Every day | ORAL | Status: DC

## 2018-11-24 MED ORDER — ALBUTEROL SULFATE (2.5 MG/3ML) 0.083% IN NEBU: 2.5000 mg | INHALATION_SOLUTION | Freq: Four times a day (QID) | RESPIRATORY_TRACT | Status: DC

## 2018-11-24 MED ORDER — DIPHENHYDRAMINE HCL (SLEEP) 25 MG PO TABS: 25.0000 mg | ORAL_TABLET | Freq: Every evening | ORAL | Status: DC | PRN

## 2018-11-24 MED ORDER — METHYLPREDNISOLONE SODIUM SUCC 125 MG IJ SOLR
60.0000 mg | Freq: Four times a day (QID) | INTRAMUSCULAR | Status: AC
  Administered 2018-11-24 – 2018-11-25 (×4): 60 mg via INTRAVENOUS
  Filled 2018-11-24 (×4): qty 2

## 2018-11-24 MED ORDER — ALBUTEROL SULFATE (2.5 MG/3ML) 0.083% IN NEBU: 2.5000 mg | INHALATION_SOLUTION | RESPIRATORY_TRACT | Status: DC | PRN

## 2018-11-24 MED ORDER — DOXYCYCLINE HYCLATE 100 MG PO TABS
100.0000 mg | ORAL_TABLET | Freq: Two times a day (BID) | ORAL | Status: DC
  Administered 2018-11-24 – 2018-11-25 (×2): 100 mg via ORAL
  Filled 2018-11-24 (×2): qty 1

## 2018-11-24 MED ORDER — GUAIFENESIN ER 600 MG PO TB12
600.0000 mg | ORAL_TABLET | Freq: Two times a day (BID) | ORAL | Status: DC
  Administered 2018-11-24 – 2018-11-25 (×2): 600 mg via ORAL
  Filled 2018-11-24 (×2): qty 1

## 2018-11-24 MED ORDER — IPRATROPIUM-ALBUTEROL 0.5-2.5 (3) MG/3ML IN SOLN
RESPIRATORY_TRACT | Status: AC
  Administered 2018-11-24: 3 mL via RESPIRATORY_TRACT
  Filled 2018-11-24: qty 3

## 2018-11-24 MED ORDER — ASPIRIN EC 81 MG PO TBEC
81.0000 mg | DELAYED_RELEASE_TABLET | Freq: Every day | ORAL | Status: DC
  Administered 2018-11-25: 81 mg via ORAL
  Filled 2018-11-24: qty 1

## 2018-11-24 MED ORDER — ALBUTEROL SULFATE HFA 108 (90 BASE) MCG/ACT IN AERS
8.0000 | INHALATION_SPRAY | Freq: Once | RESPIRATORY_TRACT | Status: AC
  Administered 2018-11-24: 08:00:00 8 via RESPIRATORY_TRACT
  Filled 2018-11-24: qty 6.7

## 2018-11-24 MED ORDER — FLUTICASONE-UMECLIDIN-VILANT 100-62.5-25 MCG/INH IN AEPB: 1.0000 | INHALATION_SPRAY | Freq: Every day | RESPIRATORY_TRACT | Status: DC

## 2018-11-24 MED ORDER — ALBUTEROL SULFATE HFA 108 (90 BASE) MCG/ACT IN AERS: 2.0000 | INHALATION_SPRAY | Freq: Four times a day (QID) | RESPIRATORY_TRACT | Status: DC

## 2018-11-24 MED ORDER — IPRATROPIUM-ALBUTEROL 0.5-2.5 (3) MG/3ML IN SOLN
3.0000 mL | Freq: Three times a day (TID) | RESPIRATORY_TRACT | Status: DC
  Administered 2018-11-24 – 2018-11-25 (×3): 3 mL via RESPIRATORY_TRACT
  Filled 2018-11-24 (×2): qty 3

## 2018-11-24 MED ORDER — ENOXAPARIN SODIUM 40 MG/0.4ML ~~LOC~~ SOLN: 40.0000 mg | SUBCUTANEOUS | Status: DC

## 2018-11-24 MED ORDER — CLOPIDOGREL BISULFATE 75 MG PO TABS
75.0000 mg | ORAL_TABLET | Freq: Every day | ORAL | Status: DC
  Administered 2018-11-24 – 2018-11-25 (×2): 75 mg via ORAL
  Filled 2018-11-24 (×2): qty 1

## 2018-11-24 MED ORDER — LOSARTAN POTASSIUM 50 MG PO TABS: 25.0000 mg | ORAL_TABLET | Freq: Every day | ORAL | Status: DC

## 2018-11-24 MED ORDER — METOPROLOL SUCCINATE ER 25 MG PO TB24
25.0000 mg | ORAL_TABLET | Freq: Every day | ORAL | Status: DC
  Administered 2018-11-24 – 2018-11-25 (×2): 25 mg via ORAL
  Filled 2018-11-24 (×2): qty 1

## 2018-11-24 MED ORDER — SODIUM CHLORIDE 0.9% FLUSH
3.0000 mL | Freq: Two times a day (BID) | INTRAVENOUS | Status: DC
  Administered 2018-11-24 – 2018-11-25 (×2): 3 mL via INTRAVENOUS

## 2018-11-24 MED ORDER — PANTOPRAZOLE SODIUM 40 MG PO TBEC
40.0000 mg | DELAYED_RELEASE_TABLET | Freq: Every day | ORAL | Status: DC
  Administered 2018-11-24: 16:00:00 40 mg via ORAL
  Filled 2018-11-24: qty 1

## 2018-11-24 MED ORDER — METHYLPREDNISOLONE SODIUM SUCC 125 MG IJ SOLR
125.0000 mg | Freq: Once | INTRAMUSCULAR | Status: AC
  Administered 2018-11-24: 125 mg via INTRAVENOUS
  Filled 2018-11-24: qty 2

## 2018-11-24 MED ORDER — IOHEXOL 350 MG/ML SOLN
100.0000 mL | Freq: Once | INTRAVENOUS | Status: AC | PRN
  Administered 2018-11-24: 100 mL via INTRAVENOUS

## 2018-11-24 MED ORDER — MONTELUKAST SODIUM 10 MG PO TABS: 10.0000 mg | ORAL_TABLET | Freq: Every day | ORAL | Status: DC

## 2018-11-24 MED ORDER — UMECLIDINIUM BROMIDE 62.5 MCG/INH IN AEPB
1.0000 | INHALATION_SPRAY | Freq: Every day | RESPIRATORY_TRACT | Status: DC
  Filled 2018-11-24: qty 7

## 2018-11-24 MED ORDER — AMLODIPINE BESYLATE 5 MG PO TABS
5.0000 mg | ORAL_TABLET | Freq: Every day | ORAL | Status: DC
  Administered 2018-11-24 – 2018-11-25 (×2): 5 mg via ORAL
  Filled 2018-11-24 (×2): qty 1

## 2018-11-24 MED ORDER — IPRATROPIUM-ALBUTEROL 20-100 MCG/ACT IN AERS
2.0000 | INHALATION_SPRAY | Freq: Two times a day (BID) | RESPIRATORY_TRACT | Status: DC
  Administered 2018-11-24: 2 via RESPIRATORY_TRACT
  Filled 2018-11-24: qty 4

## 2018-11-24 MED ORDER — SERTRALINE HCL 100 MG PO TABS
100.0000 mg | ORAL_TABLET | Freq: Two times a day (BID) | ORAL | Status: DC
  Administered 2018-11-24 – 2018-11-25 (×2): 100 mg via ORAL
  Filled 2018-11-24 (×2): qty 1

## 2018-11-24 MED ORDER — MAGNESIUM SULFATE IN D5W 1-5 GM/100ML-% IV SOLN
1.0000 g | Freq: Once | INTRAVENOUS | Status: AC
  Administered 2018-11-24: 13:00:00 1 g via INTRAVENOUS
  Filled 2018-11-24: qty 100

## 2018-11-24 MED ORDER — SODIUM CHLORIDE 0.9 % IV SOLN
100.0000 mg | Freq: Once | INTRAVENOUS | Status: AC
  Administered 2018-11-24: 13:00:00 100 mg via INTRAVENOUS
  Filled 2018-11-24: qty 100

## 2018-11-24 MED ORDER — FLUTICASONE FUROATE-VILANTEROL 100-25 MCG/INH IN AEPB
1.0000 | INHALATION_SPRAY | Freq: Every day | RESPIRATORY_TRACT | Status: DC
  Administered 2018-11-24 – 2018-11-25 (×2): 1 via RESPIRATORY_TRACT
  Filled 2018-11-24: qty 28

## 2018-11-24 MED ORDER — LOSARTAN POTASSIUM 50 MG PO TABS
50.0000 mg | ORAL_TABLET | Freq: Every morning | ORAL | Status: DC
  Administered 2018-11-24 – 2018-11-25 (×2): 50 mg via ORAL
  Filled 2018-11-24 (×2): qty 1

## 2018-11-24 MED ORDER — FERROUS SULFATE 325 (65 FE) MG PO TABS
325.0000 mg | ORAL_TABLET | Freq: Three times a day (TID) | ORAL | Status: DC
  Administered 2018-11-24 – 2018-11-25 (×2): 325 mg via ORAL
  Filled 2018-11-24 (×3): qty 1

## 2018-11-24 NOTE — ED Provider Notes (Signed)
[facility] EMERGENCY DEPARTMENT Provider Note   CSN: [id number] Arrival date & time: 11/24/18  5188     History   Chief Complaint Chief Complaint  Patient presents with   Shortness of Breath    HPI Suzanne Dickson is a 73 y.o. female.     73 yo F with a chief complaint of shortness of breath.  This been going on for 2 or 3 days.  She had called her pulmonologist and he had started on steroids.  Not taking her steroids at this morning.  Shortness of breath is worse with exertion better with rest.  She is on 2 to 4 L of oxygen at all times that she needs it.  Has had to titrate up a bit at home.  Had worsening breathing this morning and so called 911.  Was found to be significantly hypoxic into the 70s requiring nonrebreather for improvement.  She is been using inhaler with some improvement.  Has had increased sputum and change in sputum.  Denies any chest pain or pressure with this.  Denies any lower extremity edema.  The history is provided by the patient.  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   "I'm in remission" (04/05/2017)   Anxiety    Arthritis    "legs, elbows" (04/05/2017)   Bronchitis, chronic ([place])    "haven't had it in awhile" (04/05/2017)   CHF (congestive heart failure) (HCC)    COPD (chronic obstructive pulmonary disease) (HCC)    Daily headache    "recently" (04/05/2017)   Depression    Dyspnea    with exertion   GERD (gastroesophageal reflux disease)    History of blood transfusion 1950s   History of radiation therapy 05/04/16 -  05/12/16   Left lung treated to 54 Gy with 3 fx of 18 Gy   Hypertension    On home oxygen therapy    "2L; 24/7" (04/05/2017)   OSA on CPAP    Pneumonia 10/2015   "at least 3 times" (04/05/2017)    Patient Active Problem List   Diagnosis Date Noted   COPD exacerbation ([place]) 11/24/2018   Unstable angina ([place]) 04/05/2017   Adenocarcinoma of left lung, 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   Solitary pulmonary nodule on lung CT 11/20/2015   Atherosclerosis of native arteries of extremity with intermittent claudication ([place]) 10/23/2015   COPD GOLD III/ 02 dep  10/10/2014   Chronic respiratory failure with hypoxia ([place]) 10/10/2014   Acute bronchitis 09/02/2014   Essential hypertension 06/07/2009   RHINOSINUSITIS, CHRONIC 06/04/2009    Past Surgical History:  Procedure Laterality Date   COLONOSCOPY     CORONARY ANGIOPLASTY WITH STENT PLACEMENT  04/05/2017   CORONARY STENT INTERVENTION N/A 04/05/2017   Procedure: CORONARY STENT INTERVENTION;  Surgeon: [name], MD;  Location: [facility];  Service: Cardiovascular;  Laterality: N/A;   DILATION AND CURETTAGE OF UTERUS     FUDUCIAL PLACEMENT Left 03/31/2016   Procedure: PLACEMENT OF FUDUCIAL LEFT UPPER LOBE;  Surgeon: Collene Gobble, MD;  Location: [facility];  Service: Thoracic;  Laterality: Left;  LAPAROSCOPIC CHOLECYSTECTOMY     LEFT HEART CATH AND CORONARY ANGIOGRAPHY N/A 02/17/2017   Procedure: LEFT HEART CATH AND CORONARY ANGIOGRAPHY;  Surgeon: [name], MD;  Location: [facility];  Service: Cardiovascular;  Laterality: N/A;   SINUSOTOMY     TONSILLECTOMY     TUBAL LIGATION     VIDEO BRONCHOSCOPY WITH ENDOBRONCHIAL NAVIGATION N/A 03/31/2016   Procedure: VIDEO BRONCHOSCOPY WITH ENDOBRONCHIAL NAVIGATION;  Surgeon: Collene Gobble, MD;  Location: [facility];  Service: Thoracic;  Laterality: N/A;     OB History   No obstetric history on file.      Home Medications    Prior to Admission medications     Medication Sig Start Date End Date Taking? Authorizing Provider  albuterol (PROVENTIL HFA;VENTOLIN HFA) 108 (90 Base) MCG/ACT inhaler INHALE 2 PUFFS INTO THE LUNGS EVERY 4 HOURS AS NEEDED FOR WHEEZING OR SHORTNESS OF BREATH Patient taking differently: Inhale 2 puffs into the lungs every 4 (four) hours as needed for wheezing or shortness of breath.  05/01/18  Yes Tanda Rockers, MD  albuterol (PROVENTIL) (2.5 MG/3ML) 0.083% nebulizer solution Take 3 mLs (2.5 mg total) by nebulization every 4 (four) hours as needed. For shortness of breath Patient taking differently: Take 2.5 mg by nebulization every 4 (four) hours as needed for wheezing or shortness of breath.  04/16/16  Yes Tanda Rockers, MD  amLODipine (NORVASC) 5 MG tablet Take 1 tablet (5 mg total) by mouth daily. Patient taking differently: Take 5 mg by mouth every morning.  10/18/16  Yes Tanda Rockers, MD  aspirin EC 81 MG tablet Take 81 mg by mouth every morning.   Yes [provider]  cetirizine (ZYRTEC) 10 MG tablet Take 10 mg by mouth at bedtime.   Yes [provider]  clopidogrel (PLAVIX) 75 MG tablet Take 75 mg by mouth every morning.    Yes [provider]  ferrous sulfate 325 (65 FE) MG EC tablet Take 325 mg by mouth 2 (two) times daily with a meal.    Yes [provider]  Fluticasone-Umeclidin-Vilant (TRELEGY ELLIPTA) 100-62.5-25 MCG/INH AEPB Inhale 1 puff into the lungs daily. Patient taking differently: Inhale 1 puff into the lungs every morning.  02/17/18  Yes Tanda Rockers, MD  GUAIFENESIN ER PO Take 1 tablet by mouth 2 (two) times daily.    Yes [provider]  ibuprofen (ADVIL) 200 MG tablet Take 400 mg by mouth every 6 (six) hours as needed (pain).   Yes [provider]  losartan (COZAAR) 50 MG tablet Take 50 mg by mouth every morning. 11/15/18  Yes [provider]  metoprolol succinate (TOPROL-XL) 25 MG 24 hr tablet Take 25 mg by mouth every morning.    Yes  [provider]  Multiple Vitamin (MULTIVITAMIN WITH MINERALS) TABS tablet Take 1 tablet by mouth every morning.   Yes [provider]  nitroGLYCERIN (NITRODUR - DOSED IN MG/24 HR) 0.4 mg/hr patch Place 0.4 mg onto the skin every morning.  08/10/17  Yes [provider]  nitroGLYCERIN (NITROSTAT) 0.4 MG SL tablet Place 0.4 mg under the tongue every 5 (five) minutes as needed for chest pain.   Yes [provider]  OVER THE COUNTER MEDICATION Take 1 tablet by mouth at bedtime. OTC sleep aid - unknown ingredients   Yes [provider]  OXYGEN Inhale 2-4 L/min into the lungs See admin instructions. 2-3 lpm at rest and 4 lpm with exertion   Yes  [provider]  pantoprazole (PROTONIX) 40 MG tablet Take 30-60 min before first meal of the day Patient taking differently: Take 40 mg by mouth daily before breakfast.  08/08/18  Yes Tanda Rockers, MD  Polyvinyl Alcohol-Povidone (REFRESH OP) Place 1 drop into both eyes daily as needed (dry eyes).   Yes [provider]  sertraline (ZOLOFT) 100 MG tablet Take 100 mg by mouth 2 (two) times daily. 05/18/16  Yes [provider]    Family History No family history on file.  Social History Social History   Tobacco Use   Smoking status: Former Smoker    Packs/day: 1.00    Years: 40.00    Pack years: 40.00    Types: Cigarettes    Quit date: 09/02/2014    Years since quitting: 4.2   Smokeless tobacco: Never Used  Substance Use Topics   Alcohol use: No    Alcohol/week: 0.0 standard drinks   Drug use: Never     Allergies   Patient has no known allergies.   Review of Systems Review of Systems  Constitutional: Negative for chills and fever.  HENT: Negative for congestion and rhinorrhea.   Eyes: Negative for redness and visual disturbance.  Respiratory: Positive for cough and shortness of breath. Negative for wheezing.   Cardiovascular: Negative for chest pain and palpitations.    Gastrointestinal: Negative for nausea and vomiting.  Genitourinary: Negative for dysuria and urgency.  Musculoskeletal: Negative for arthralgias and myalgias.  Skin: Negative for pallor and wound.  Neurological: Negative for dizziness and headaches.  73 yo    Physical Exam Updated Vital Signs BP (!) 149/78 (BP Location: Right Arm)    Pulse 88    Temp 98.1 F (36.7 C) (Oral)    Resp 20    Ht 5\' 3"  (1.6 m)    Wt 72.5 kg    SpO2 99%    BMI 28.31 kg/m   Physical Exam Vitals signs and nursing note reviewed.  Constitutional:      General: She is not in acute distress.    Appearance: She is well-developed. She is not diaphoretic.  HENT:     Head: Normocephalic and atraumatic.  Eyes:     Pupils: Pupils are equal, round, and reactive to light.  Neck:     Musculoskeletal: Normal range of motion and neck supple.  Cardiovascular:     Rate and Rhythm: Normal rate and regular rhythm.     Heart sounds: No murmur. No friction rub. No gallop.   Pulmonary:     Effort: Pulmonary effort is normal.     Breath sounds: Wheezing (with prolonged expiratory effort) present. No rales.  Abdominal:     General: There is no distension.     Palpations: Abdomen is soft.     Tenderness: There is no abdominal tenderness.  Musculoskeletal:        General: No tenderness.  Skin:    General: Skin is warm and dry.  Neurological:     Mental Status: She is alert and oriented to person, place, and time.  Psychiatric:        Behavior: Behavior normal.      ED Treatments / Results  Labs (all labs ordered are listed, but only abnormal results are displayed) Labs Reviewed  CBC WITH DIFFERENTIAL/PLATELET - Abnormal; Notable for the following components:      Result Value   WBC 10.6 (*)    RBC 3.06 (*)    Hemoglobin 8.3 (*)  HCT 28.7 (*)    MCHC 28.9 (*)    Neutro Abs 9.0 (*)    Abs Immature Granulocytes 0.08 (*)    All other components within normal limits  BASIC METABOLIC PANEL - Abnormal; Notable  for the following components:   Glucose, Bld 111 (*)    GFR calc non Af Amer 58 (*)    All other components within normal limits  HEMOGLOBIN AND HEMATOCRIT, BLOOD - Abnormal; Notable for the following components:   Hemoglobin 8.1 (*)    HCT 28.2 (*)    All other components within normal limits  CULTURE, BLOOD (ROUTINE X 2)  CULTURE, BLOOD (ROUTINE X 2)  SARS CORONAVIRUS 2 (TAT 6-24 HRS)  HIV ANTIBODY (ROUTINE TESTING W REFLEX)  OCCULT BLOOD X 1 CARD TO LAB, STOOL  TYPE AND SCREEN  ABO/RH  TROPONIN I (HIGH SENSITIVITY)    EKG EKG Interpretation  Date/Time:  Friday November 24 2018 07:20:49 EDT Ventricular Rate:  86 PR Interval:    QRS Duration: 78 QT Interval:  443 QTC Calculation: 530 R Axis:   75 Text Interpretation: Sinus rhythm Nonspecific T abnrm, anterolateral leads Otherwise no significant change Confirmed by Deno Etienne [phone]) on 11/24/2018 9:36:21 AM   Radiology Ct Angio Chest Pe W And/or Wo Contrast  Result Date: 11/24/2018 CLINICAL DATA:  Shortness of breath. EXAM: CT ANGIOGRAPHY CHEST WITH CONTRAST TECHNIQUE: Multidetector CT imaging of the chest was performed using the standard protocol during bolus administration of intravenous contrast. Multiplanar CT image reconstructions and MIPs were obtained to evaluate the vascular anatomy. CONTRAST:  162mL OMNIPAQUE IOHEXOL 350 MG/ML SOLN COMPARISON:  August 25, 2018. FINDINGS: Cardiovascular: Satisfactory opacification of the pulmonary arteries to the segmental level. No evidence of pulmonary embolism. Normal heart size. No pericardial effusion. Coronary artery calcifications are noted. Enlargement of pulmonary arteries are noted suggesting pulmonary artery hypertension. Atherosclerosis of thoracic aorta is noted without aneurysm formation. Mediastinum/Nodes: Stable 1.7 cm right thyroid nodule. Esophagus is unremarkable. No significant adenopathy is noted. Lungs/Pleura: No pneumothorax or pleural effusion is noted. Stable  densities are noted in the left upper and lower lobes most consistent with post radiation change. Stable irregular density is noted anteriorly in the right upper lobe most consistent with scarring or fibrosis. Right lower lobe nodule noted on prior exam is not well visualized currently. Upper Abdomen: No acute abnormality. Musculoskeletal: No chest wall abnormality. No acute or significant osseous findings. Review of the MIP images confirms the above findings. IMPRESSION: No definite evidence of pulmonary embolus. Coronary artery calcifications are noted. Enlargement of pulmonary arteries are noted suggesting pulmonary artery hypertension. Stable 1.7 cm right thyroid nodule. Stable densities are noted in the left upper and lower lobes most consistent with post radiation change. Stable right upper lobe scarring or fibrosis is noted. Aortic Atherosclerosis (ICD10-I70.0). Electronically Signed   By: Marijo Conception M.D.   On: 11/24/2018 11:55   Dg Chest Port 1 View  Result Date: 11/24/2018 CLINICAL DATA:  73 year old female with history of shortness of breath. Weakness. EXAM: PORTABLE CHEST 1 VIEW COMPARISON:  Chest x-ray 10/19/2017. FINDINGS: Lung volumes are normal. No consolidative airspace disease. No pleural effusions. Linear area of architectural distortion in the left mid lung adjacent to surgical clips, similar to the prior study, compatible with a combination of postoperative and postradiation scarring. No definite suspicious appearing pulmonary nodules or masses are noted on today's plain film examination. No evidence of pulmonary edema. Heart size is upper limits of normal. Upper mediastinal contours are  within normal limits. Aortic atherosclerosis. IMPRESSION: 1. Stable radiographic appearance the chest, as above, without findings to suggest acute cardiopulmonary disease. 2. Aortic atherosclerosis. Electronically Signed   By: Vinnie Langton M.D.   On: 11/24/2018 07:47    Procedures Procedures  (including critical care time)  Medications Ordered in ED Medications  sodium chloride flush (NS) 0.9 % injection 3 mL (has no administration in time range)  guaiFENesin (MUCINEX) 12 hr tablet 600 mg (has no administration in time range)  methylPREDNISolone sodium succinate (SOLU-MEDROL) 125 mg/2 mL injection 60 mg (has no administration in time range)    Followed by  predniSONE (DELTASONE) tablet 40 mg (has no administration in time range)  albuterol (PROVENTIL) (2.5 MG/3ML) 0.083% nebulizer solution 2.5 mg (has no administration in time range)  aspirin EC tablet 81 mg (has no administration in time range)  amLODipine (NORVASC) tablet 5 mg (has no administration in time range)  metoprolol succinate (TOPROL-XL) 24 hr tablet 25 mg (has no administration in time range)  sertraline (ZOLOFT) tablet 100 mg (has no administration in time range)  pantoprazole (PROTONIX) EC tablet 40 mg (has no administration in time range)  clopidogrel (PLAVIX) tablet 75 mg (has no administration in time range)  ferrous sulfate tablet 325 mg (has no administration in time range)  doxycycline (VIBRA-TABS) tablet 100 mg (has no administration in time range)  albuterol (VENTOLIN HFA) 108 (90 Base) MCG/ACT inhaler 2 puff (has no administration in time range)  losartan (COZAAR) tablet 50 mg (has no administration in time range)  fluticasone furoate-vilanterol (BREO ELLIPTA) 100-25 MCG/INH 1 puff (has no administration in time range)  umeclidinium bromide (INCRUSE ELLIPTA) 62.5 MCG/INH 1 puff (has no administration in time range)  albuterol (VENTOLIN HFA) 108 (90 Base) MCG/ACT inhaler 8 puff (8 puffs Inhalation Given 11/24/18 0756)  methylPREDNISolone sodium succinate (SOLU-MEDROL) 125 mg/2 mL injection 125 mg (125 mg Intravenous Given 11/24/18 0756)  doxycycline (VIBRAMYCIN) 100 mg in sodium chloride 0.9 % 250 mL IVPB (100 mg Intravenous New Bag/Given 11/24/18 1246)  magnesium sulfate IVPB 1 g 100 mL (1 g Intravenous  New Bag/Given 11/24/18 1323)  iohexol (OMNIPAQUE) 350 MG/ML injection 100 mL (100 mLs Intravenous Contrast Given 11/24/18 1137)     Initial Impression / Assessment and Plan / ED Course  I have reviewed the triage vital signs and the nursing notes.  Pertinent labs & imaging results that were available during my care of the patient were reviewed by me and considered in my medical decision making (see chart for details).        73 yo F with a cc of sob.  Going on for past couple days.  Presentation most consistent with copd exacerbation.  Give inhalers, steroids, labs, reassess.   The patient is feeling much better after a puffs albuterol Solu-Medrol and 2 puffs of Combivent.  She is requesting to go home.  Her lab work shows drop in her hemoglobin from 11-8.3.  This is over a couple months.  She denies any dark stool or blood in her stool.  States she is chronically on iron.  Will attempt to ambulate.  Patient was able to walk with assistance required 4 L at all times and actually dropped down into the upper 70s.  Was very short of breath on my repeat examination in the room.  Rectal exam with soft brown stool.  As the patient has a history of lung cancer and is very short of breath on exertion PE must also be in the  differential.  Will discuss with hospitalist.  CRITICAL CARE Performed by: Cecilio Asper   Total critical care time: 35 minutes  Critical care time was exclusive of separately billable procedures and treating other patients.  Critical care was necessary to treat or prevent imminent or life-threatening deterioration.  Critical care was time spent personally by me on the following activities: development of treatment plan with patient and/or surrogate as well as nursing, discussions with consultants, evaluation of patient's response to treatment, examination of patient, obtaining history from patient or surrogate, ordering and performing treatments and interventions,  ordering and review of laboratory studies, ordering and review of radiographic studies, pulse oximetry and re-evaluation of patient's condition.  The patients results and plan were reviewed and discussed.   Any x-rays performed were independently reviewed by myself.   Differential diagnosis were considered with the presenting HPI.  Medications  sodium chloride flush (NS) 0.9 % injection 3 mL (has no administration in time range)  guaiFENesin (MUCINEX) 12 hr tablet 600 mg (has no administration in time range)  methylPREDNISolone sodium succinate (SOLU-MEDROL) 125 mg/2 mL injection 60 mg (has no administration in time range)    Followed by  predniSONE (DELTASONE) tablet 40 mg (has no administration in time range)  albuterol (PROVENTIL) (2.5 MG/3ML) 0.083% nebulizer solution 2.5 mg (has no administration in time range)  aspirin EC tablet 81 mg (has no administration in time range)  amLODipine (NORVASC) tablet 5 mg (has no administration in time range)  metoprolol succinate (TOPROL-XL) 24 hr tablet 25 mg (has no administration in time range)  sertraline (ZOLOFT) tablet 100 mg (has no administration in time range)  pantoprazole (PROTONIX) EC tablet 40 mg (has no administration in time range)  clopidogrel (PLAVIX) tablet 75 mg (has no administration in time range)  ferrous sulfate tablet 325 mg (has no administration in time range)  doxycycline (VIBRA-TABS) tablet 100 mg (has no administration in time range)  albuterol (VENTOLIN HFA) 108 (90 Base) MCG/ACT inhaler 2 puff (has no administration in time range)  losartan (COZAAR) tablet 50 mg (has no administration in time range)  fluticasone furoate-vilanterol (BREO ELLIPTA) 100-25 MCG/INH 1 puff (has no administration in time range)  umeclidinium bromide (INCRUSE ELLIPTA) 62.5 MCG/INH 1 puff (has no administration in time range)  albuterol (VENTOLIN HFA) 108 (90 Base) MCG/ACT inhaler 8 puff (8 puffs Inhalation Given 11/24/18 0756)    methylPREDNISolone sodium succinate (SOLU-MEDROL) 125 mg/2 mL injection 125 mg (125 mg Intravenous Given 11/24/18 0756)  doxycycline (VIBRAMYCIN) 100 mg in sodium chloride 0.9 % 250 mL IVPB (100 mg Intravenous New Bag/Given 11/24/18 1246)  magnesium sulfate IVPB 1 g 100 mL (1 g Intravenous New Bag/Given 11/24/18 1323)  iohexol (OMNIPAQUE) 350 MG/ML injection 100 mL (100 mLs Intravenous Contrast Given 11/24/18 1137)    Vitals:   11/24/18 1115 11/24/18 1215 11/24/18 1245 11/24/18 1423  BP: (!) 157/74 (!) 155/68 (!) 149/80 (!) 149/78  Pulse:  89 81 88  Resp: 20 (!) 21 19 20   Temp:    98.1 F (36.7 C)  TempSrc:    Oral  SpO2:  98% 98% 99%  Weight:    72.5 kg  Height:    5\' 3"  (1.6 m)    Final diagnoses:  Acute respiratory failure with hypoxia (HCC)    Admission/ observation were discussed with the admitting physician, patient and/or family and they are comfortable with the plan.   Final Clinical Impressions(s) / ED Diagnoses   Final diagnoses:  Acute respiratory failure with hypoxia (  [facility])    ED Discharge Orders    None       Deno Etienne, DO 11/24/18 1501

## 2018-11-24 NOTE — ED Notes (Signed)
5C unable to take report at this time. Phone number left with 5C

## 2018-11-24 NOTE — H&P (Signed)
History and Physical    Suzanne Dickson JJK:[id number] DOB: [birth year] DOA: 11/24/2018  Referring MD/NP/PA: Deno Etienne, MD PCP: [name], MD  Patient coming from: Home via EMS  Chief Complaint: Shortness of breath  I have personally briefly reviewed patient's old medical records in [place]   HPI: Suzanne Dickson is a 73 y.o. 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  She presents with 2-week history of intermittent shortness of breath.  Complains of having a productive cough with thick greenish-yellow sputum.  Associated symptoms include wheezing and coughing up intermittent blood-tinged sputum.  Denies having any significant fever, chills, chest pain, change in weight, significant leg swelling, nausea, vomiting, diarrhea, or dysuria.  She had been using her albuterol inhaler every 4 hours without any relief of symptoms yesterday.  She was unable to walk acute feet without becoming short of breath.  Titrating her oxygen up did not help much.  She has a nebulizer machine, but did not try this before coming into the hospital.  Patient follows with Dr. Melvyn Novas of pulmonology in outpatient setting and had recently been started on prednisone for symptoms.  Patient called 911 this morning due to her worsening of her symptoms.  En route with EMS patient was noted to have O2 saturations of 82%.  Initially placed on a nonrebreather mask, but able to be weaned down to 4 L nasal cannula oxygen.  ED Course: Upon arrival to the emergency department patient was noted to be afebrile, respirations 17-23,  O2 saturations maintained on 4 L of nasal cannula oxygen, and all other vital signs maintained.  Labs revealed WBC 10.6, hemoglobin 8.3, BUN 20, creatinine 0.98, and high-sensitivity troponin negative.  Chest x-ray was noted to be stable in appearance.   Patient was given albuterol/Combivent inhaler, doxycycline, and 125mg  Solu-Medrol IV.  Dr. Terrence Dupont was contacted but recommended TRH to admit.  Review of systems: A complete 10 point review of systems was performed and negative except for as noted above in HPI.  Past Medical History:  Diagnosis Date   Adenocarcinoma of left lung, stage 1 ([place]) 04/15/2016   "I'm in remission" (04/05/2017)   Anxiety    Arthritis    "legs, elbows" (04/05/2017)   Bronchitis, chronic ([place])    "haven't had it in awhile" (04/05/2017)   CHF (congestive heart failure) (HCC)    COPD (chronic obstructive pulmonary disease) (HCC)    Daily headache    "recently" (04/05/2017)   Depression    Dyspnea    with exertion   GERD (gastroesophageal reflux disease)    History of blood transfusion 1950s   History of radiation therapy 05/04/16 - 05/12/16   Left lung treated to 54 Gy with 3 fx of 18 Gy   Hypertension    On home oxygen therapy    "2L; 24/7" (04/05/2017)   OSA on CPAP    Pneumonia 10/2015   "at least 3 times" (04/05/2017)    Past Surgical History:  Procedure Laterality Date   COLONOSCOPY     CORONARY ANGIOPLASTY WITH STENT PLACEMENT  04/05/2017   CORONARY STENT INTERVENTION N/A 04/05/2017   Procedure: CORONARY STENT INTERVENTION;  Surgeon: [name], MD;  Location: [facility];  Service: Cardiovascular;  Laterality: N/A;   DILATION AND CURETTAGE OF UTERUS     FUDUCIAL PLACEMENT Left 03/31/2016   Procedure: PLACEMENT OF FUDUCIAL LEFT UPPER  LOBE;  Surgeon: Collene Gobble, MD;  Location: [facility];  Service: Thoracic;  Laterality: Left;   LAPAROSCOPIC CHOLECYSTECTOMY     LEFT HEART CATH AND CORONARY ANGIOGRAPHY N/A 02/17/2017   Procedure: LEFT HEART CATH AND CORONARY ANGIOGRAPHY;  Surgeon: [name], MD;  Location: [facility];  Service: Cardiovascular;  Laterality: N/A;   SINUSOTOMY     TONSILLECTOMY     TUBAL LIGATION     VIDEO BRONCHOSCOPY WITH ENDOBRONCHIAL  NAVIGATION N/A 03/31/2016   Procedure: VIDEO BRONCHOSCOPY WITH ENDOBRONCHIAL NAVIGATION;  Surgeon: Collene Gobble, MD;  Location: [facility];  Service: Thoracic;  Laterality: N/A;     reports that she quit smoking about 4 years ago. Her smoking use included cigarettes. She has a 40.00 pack-year smoking history. She has never used smokeless tobacco. She reports that she does not drink alcohol or use drugs.  No Known Allergies  No significant family history reported.  Prior to Admission medications   Medication Sig Start Date End Date Taking? Authorizing Provider  albuterol (PROVENTIL HFA;VENTOLIN HFA) 108 (90 Base) MCG/ACT inhaler INHALE 2 PUFFS INTO THE LUNGS EVERY 4 HOURS AS NEEDED FOR WHEEZING OR SHORTNESS OF BREATH 05/01/18   Tanda Rockers, MD  albuterol (PROVENTIL) (2.5 MG/3ML) 0.083% nebulizer solution Take 3 mLs (2.5 mg total) by nebulization every 4 (four) hours as needed. For shortness of breath Patient taking differently: Take 2.5 mg by nebulization every 4 (four) hours as needed for wheezing or shortness of breath.  04/16/16   Tanda Rockers, MD  amLODipine (NORVASC) 5 MG tablet Take 1 tablet (5 mg total) by mouth daily. 10/18/16   Tanda Rockers, MD  aspirin 81 MG tablet Take 81 mg by mouth daily.    [provider]  clopidogrel (PLAVIX) 75 MG tablet Take 75 mg by mouth daily.    [provider]  diphenhydrAMINE (SOMINEX) 25 MG tablet Take 25 mg by mouth at bedtime as needed for sleep.    [provider]  ferrous sulfate 325 (65 FE) MG EC tablet Take 325 mg by mouth 3 (three) times daily with meals.    [provider]  Fluticasone-Umeclidin-Vilant (TRELEGY ELLIPTA) 100-62.5-25 MCG/INH AEPB Inhale 1 puff into the lungs daily. 02/17/18   Tanda Rockers, MD  guaiFENesin (MUCINEX) 600 MG 12 hr tablet Take 600 mg by mouth daily as needed (for allergies).     [provider]  losartan (COZAAR) 25 MG tablet Take 25 mg by mouth daily.    [provider]  metoprolol succinate (TOPROL-XL) 25 MG 24 hr tablet Take 25 mg by mouth daily.    [provider]  mometasone (NASONEX) 50 MCG/ACT nasal spray Place into the nose. 12/07/17   [provider]  montelukast (SINGULAIR) 10 MG tablet Take by mouth. 12/07/17   [provider]  nitroGLYCERIN (NITRODUR - DOSED IN MG/24 HR) 0.4 mg/hr patch 0.4 mg. 08/10/17   [provider]  nitroGLYCERIN (NITROSTAT) 0.4 MG SL tablet Place 0.4 mg under the tongue every 5 (five) minutes as needed for chest pain.    [provider]  OXYGEN 2lpm with sleep and 4 lpm with exertion    [provider]  pantoprazole (PROTONIX) 40 MG tablet Take 30-60 min before first meal of the day 08/08/18   Tanda Rockers, MD  predniSONE (DELTASONE) 10 MG tablet Take  4 each am x 2 days,   2 each am x 2 days,  1 each am  x 2 days and stop 05/08/18   Tanda Rockers, MD  sertraline (ZOLOFT) 100 MG tablet Take 100 mg by mouth 2 (two) times daily. 05/18/16   [provider]    Physical Exam:  Constitutional: Elderly female who appears to be in some respiratory discomfort Vitals:   11/24/18 0845 11/24/18 0900 11/24/18 0915 11/24/18 0930  BP: 128/87 (!) 128/53 (!) 118/58 128/90  Pulse: 70 69 69 72  Resp: 18 18 18 18   Temp:      TempSrc:      SpO2: 97% 97% 98% 98%  Weight:      Height:       Eyes: PERRL, lids and conjunctivae normal ENMT: Mucous membranes are dry. Posterior pharynx clear of any exudate or lesions. Neck: normal, supple, no masses, no thyromegaly.  No JVD Respiratory: Decreased overall aeration with positive expiratory wheezes heard in both lung fields throughout.  Patient currently on 4 L nasal cannula oxygen maintaining O2 saturations. Cardiovascular: Regular rate and rhythm, no murmurs / rubs / gallops.  Trace lower extremity edema. 2+ pedal pulses. No carotid bruits.  Abdomen: no tenderness, no masses palpated. No hepatosplenomegaly. Bowel  sounds positive.  Musculoskeletal: no clubbing / cyanosis. No joint deformity upper and lower extremities. Good ROM, no contractures. Normal muscle tone.  Skin: no rashes, lesions, ulcers. No induration Neurologic: CN 2-12 grossly intact. Sensation intact, DTR normal. Strength 5/5 in all 4.  Psychiatric: Normal judgment and insight. Alert and oriented x 3. Normal mood.     Labs on Admission: I have personally reviewed following labs and imaging studies  CBC: Recent Labs  Lab 11/24/18 0726  WBC 10.6*  NEUTROABS 9.0*  HGB 8.3*  HCT 28.7*  MCV 93.8  PLT 665   Basic Metabolic Panel: Recent Labs  Lab 11/24/18 0726  NA 142  K 3.8  CL 111  CO2 23  GLUCOSE 111*  BUN 20  CREATININE 0.98  CALCIUM 9.0   GFR: Estimated Creatinine Clearance: 49.6 mL/min (by C-G formula based on SCr of 0.98 mg/dL). Liver Function Tests: No results for input(s): AST, ALT, ALKPHOS, BILITOT, PROT, ALBUMIN in the last 168 hours. No results for input(s): LIPASE, AMYLASE in the last 168 hours. No results for input(s): AMMONIA in the last 168 hours. Coagulation Profile: No results for input(s): INR, PROTIME in the last 168 hours. Cardiac Enzymes: No results for input(s): CKTOTAL, CKMB, CKMBINDEX, TROPONINI in the last 168 hours. BNP (last 3 results) No results for input(s): PROBNP in the last 8760 hours. HbA1C: No results for input(s): HGBA1C in the last 72 hours. CBG: No results for input(s): GLUCAP in the last 168 hours. Lipid Profile: No results for input(s): CHOL, HDL, LDLCALC, TRIG, CHOLHDL, LDLDIRECT in the last 72 hours. Thyroid Function Tests: No results for input(s): TSH, T4TOTAL, FREET4, T3FREE, THYROIDAB in the last 72 hours. Anemia Panel: No results for input(s): VITAMINB12, FOLATE, FERRITIN, TIBC, IRON, RETICCTPCT in the last 72 hours. Urine analysis:    Component Value Date/Time   COLORURINE YELLOW 09/02/2014 2138   APPEARANCEUR CLEAR 09/02/2014 2138   LABSPEC 1.019 09/02/2014  2138   PHURINE 5.0 09/02/2014 2138   GLUCOSEU NEGATIVE 09/02/2014 2138   HGBUR NEGATIVE 09/02/2014 2138   BILIRUBINUR NEGATIVE 09/02/2014 2138   [place] NEGATIVE 09/02/2014 2138   PROTEINUR NEGATIVE 09/02/2014 2138   UROBILINOGEN 0.2 09/02/2014 2138   NITRITE NEGATIVE 09/02/2014 2138   LEUKOCYTESUR NEGATIVE 09/02/2014 2138   Sepsis Labs: No results found for this or any previous visit (  from the past 240 hour(s)).   Radiological Exams on Admission: Dg Chest Port 1 View  Result Date: 11/24/2018 CLINICAL DATA:  73 year old female with history of shortness of breath. Weakness. EXAM: PORTABLE CHEST 1 VIEW COMPARISON:  Chest x-ray 10/19/2017. FINDINGS: Lung volumes are normal. No consolidative airspace disease. No pleural effusions. Linear area of architectural distortion in the left mid lung adjacent to surgical clips, similar to the prior study, compatible with a combination of postoperative and postradiation scarring. No definite suspicious appearing pulmonary nodules or masses are noted on today's plain film examination. No evidence of pulmonary edema. Heart size is upper limits of normal. Upper mediastinal contours are within normal limits. Aortic atherosclerosis. IMPRESSION: 1. Stable radiographic appearance the chest, as above, without findings to suggest acute cardiopulmonary disease. 2. Aortic atherosclerosis. Electronically Signed   By: Vinnie Langton M.D.   On: 11/24/2018 07:47    EKG: Independently reviewed.  Sinus rhythm at 86 bpm with QTc 530.  Assessment/Plan Acute on chronic respiratory failure with hypoxia secondary to COPD exacerbation: Patient presents with worsening shortness of breath over the last 2 weeks.  She also reported intermittent hemoptysis.  Chest x-ray otherwise clear.  Patient given 125 mg of Solu-Medrol IV and breathing treatments.  Question if patient's low blood counts are also contributing to her symptoms. -Admit to a medical telemetry bed -COPD order set  utilized -Continuous pulse oximetry with nasal cannula oxygen to maintain O2 saturations -Follow-up CT angiogram of the chest -Continue empiric doxycycline  -Albuterol nebs 4 times daily -Continue trilogy inhaler -Solu-Medrol IV every 6 hours then transition to p.o. prednisone -PT/OT to evaluate and treat -Mucinex   Leukocytosis: Acute.  Patient had been on prednisone recently.  Denies having any fever. -Continue to monitor  Iron deficiency anemia: Patient initial hemoglobin 8.3 g/dL.  She is currently on iron supplementation and appears like hemoglobin last was 11 at the end of July. -Check H&H now -Check stool guaiac -Type and screen for possible need of blood product  Prolonged QT interval: Initial QTc 530 on admission. -Give 1 g of magnesium sulfate IV -Continue to monitor and recheck EKG in a.m.  Essential hypertension -Continue amlodipine, losartan, and metoprolol  History of adenocarcinoma of the lung: Status post stereotactic radiation therapy. -Continue outpatient follow-up with Dr. Earlie Server  Anxiety/depression -Continue Zoloft  GERD  -Restart Protonix when medically appropriate  DVT prophylaxis: SCD for now, consider continue waiting DVT prophylaxis if bleeding ruled out Code Status: Full Family Communication: No family present at bedside Disposition Plan: Likely discharge home in 1 to 3 days Consults called: None  Admission status: observation   Norval Morton MD [facility] Pager [phone]   If 7PM-7AM, please contact night-coverage [email or web address] Password [email or web address]  11/24/2018, 10:36 AM

## 2018-11-24 NOTE — ED Triage Notes (Signed)
Pt woke up this morning feeling SOB. Pt used inhaler with no relief. EMS arrived pt was SpO2 at 82%. Placed on NRB. Pt then on [place] 4liters at 99%

## 2018-11-24 NOTE — ED Notes (Signed)
Pt ambulatory on 4L , O2 sats in low 80%. Up to 98% on 4L while resting in bed.

## 2018-11-25 DIAGNOSIS — J441 Chronic obstructive pulmonary disease with (acute) exacerbation: Secondary | ICD-10-CM | POA: Diagnosis not present

## 2018-11-25 DIAGNOSIS — J9621 Acute and chronic respiratory failure with hypoxia: Secondary | ICD-10-CM | POA: Diagnosis not present

## 2018-11-25 DIAGNOSIS — I2721 Secondary pulmonary arterial hypertension: Secondary | ICD-10-CM

## 2018-11-25 LAB — CBC
HCT: 28.1 % — ABNORMAL LOW (ref 36.0–46.0)
Hemoglobin: 8.2 g/dL — ABNORMAL LOW (ref 12.0–15.0)
MCH: 27.2 pg (ref 26.0–34.0)
MCHC: 29.2 g/dL — ABNORMAL LOW (ref 30.0–36.0)
MCV: 93 fL (ref 80.0–100.0)
Platelets: 169 10*3/uL (ref 150–400)
RBC: 3.02 MIL/uL — ABNORMAL LOW (ref 3.87–5.11)
RDW: 13.2 % (ref 11.5–15.5)
WBC: 8.8 10*3/uL (ref 4.0–10.5)
nRBC: 0 % (ref 0.0–0.2)

## 2018-11-25 LAB — BASIC METABOLIC PANEL
Anion gap: 12 (ref 5–15)
BUN: 19 mg/dL (ref 8–23)
CO2: 21 mmol/L — ABNORMAL LOW (ref 22–32)
Calcium: 9.1 mg/dL (ref 8.9–10.3)
Chloride: 107 mmol/L (ref 98–111)
Creatinine, Ser: 1.03 mg/dL — ABNORMAL HIGH (ref 0.44–1.00)
GFR calc Af Amer: >60 mL/min (ref >60)
GFR calc non Af Amer: 54 mL/min — ABNORMAL LOW (ref >60)
Glucose, Bld: 219 mg/dL — ABNORMAL HIGH (ref 70–99)
Potassium: 4 mmol/L (ref 3.5–5.1)
Sodium: 140 mmol/L (ref 135–145)

## 2018-11-25 MED ORDER — DOXYCYCLINE HYCLATE 100 MG PO TABS: 100.0000 mg | ORAL_TABLET | Freq: Two times a day (BID) | ORAL | 0 refills | Status: AC

## 2018-11-25 MED ORDER — PREDNISONE 50 MG PO TABS: 50.0000 mg | ORAL_TABLET | Freq: Every day | ORAL | 0 refills | Status: AC

## 2018-11-25 NOTE — Progress Notes (Signed)
Discharge instruction reviewed with patient/family. All questions answered at this time. Transportation provided by family.  Ave Filter, RN

## 2018-11-25 NOTE — Discharge Instructions (Signed)

## 2018-11-25 NOTE — TOC Transition Note (Signed)
Transition of Care [facility]) - CM/SW Discharge Note   Patient Details  Name: Suzanne Dickson MRN: [id number] Date of Birth: 1945-11-30  [facility]) CM/SW Contact:  Carles Collet, RN Phone Number: 11/25/2018, 11:41 AM   Clinical Narrative:   Damaris Schooner w patient. She has spouse at bedside with portable tank for transport. She declines need for 3/1 and has a RW at home already, no other DME needs.  She would like to use [facility] for [facility] services, we reviewed Medicare ratings. No other CM needs at this time. Updated bedside RN    Final next level of care: [place] Barriers to Discharge: No Barriers Identified   Patient Goals and CMS Choice Patient states their goals for this hospitalization and ongoing recovery are:: to go home CMS Medicare.gov Compare Post Acute Care list provided to:: Patient Choice offered to / list presented to : Patient  Discharge Placement                       Discharge Plan and Services                          HH Arranged: PT, OT [facility] Agency: [facility] Date [place]: 11/25/18 Time [place]: 1140 Representative spoke with at [place]: Cassie  Social Determinants of Health ([place]) Interventions     Readmission Risk Interventions No flowsheet data found.

## 2018-11-25 NOTE — Care Management Obs Status (Signed)
[place] NOTIFICATION   Patient Details  Name: Suzanne Dickson MRN: [id number] Date of Birth: 1945/06/13   Medicare Observation Status Notification Given:  Yes    Carles Collet, RN 11/25/2018, 11:31 AM

## 2018-11-25 NOTE — Progress Notes (Signed)
Occupational Therapy Evaluation Patient Details Name: AYANA IMHOF MRN: [id number] DOB: 01/11/1946 Today's Date: 11/25/2018    History of Present Illness Patient is a 73 year old female with 2 week history of SOB. PMH includes, HTN, COPD, lung CA, home O2.   Clinical Impression   PTA, pt lived at home with husband and ambulated with her rollator and was having more difficulty completing her ADL tasks due to SOB. Pt desats to high 70s with ambulation to [facility] with 4/4 DOE on 4L. Increased time required to rebound to high 80s with apparent discomfort due to dyspnea. Educated pt on pursed lip breathing and encouraged use of incentive spirometer. Pt issued information on energy conservation. Recommend DC home with [place] and [place]. Will follow acutely to facilitate safe DC home.     Follow Up Recommendations  Home health OT;Supervision - Intermittent    Equipment Recommendations  3 in 1 bedside commode    Recommendations for Other Services       Precautions / Restrictions Precautions Precautions: Fall Precaution Comments: watch O2 Sats Restrictions Weight Bearing Restrictions: No      Mobility Bed Mobility Overal bed mobility: Modified Independent             General bed mobility comments: received with patient sitting on side of bed  Transfers Overall transfer level: Modified independent   Transfers: Sit to/from Stand Sit to [employer] Overall balance assessment: No apparent balance deficits (not formally assessed) Sitting-balance support: Feet supported Sitting balance-Leahy Scale: Good                                ADL either performed or assessed with clinical judgement   ADL Overall ADL's : Needs assistance/impaired     Grooming: Set up;Sitting   Upper Body Bathing: Set up;Sitting   Lower Body Bathing: Min guard;Sit to/from stand   Upper Body Dressing : Set up;Sitting   Lower Body Dressing: Min guard;Sit to/from  stand   Toilet Transfer: Supervision/safety;BSC   Toileting- Water quality scientist and Hygiene: Set up;Sit to/from stand       Functional mobility during ADLs: Supervision/safety;Rolling walker General ADL Comments: Becomes very dyspnic with activity 4/4 DOE. Desats to 78 on 4L with 1 extender. Began educating on energy conservation and activity modification. Pt with urinary urgency and is "worn out" lby the time she makes it to the bathroom. Recommend she keep a BSC by her recliner to reduce her DOE adn conserve energy. Pt verbalized understanding.     Vision         Perception     Praxis      Pertinent Vitals/Pain Pain Assessment: Faces Faces Pain Scale: Hurts little more Pain Location: discomfort with breathing Pain Descriptors / Indicators: Discomfort Pain Intervention(s): Limited activity within patient's tolerance     Hand Dominance Right   Extremity/Trunk Assessment Upper Extremity Assessment Upper Extremity Assessment: Generalized weakness   Lower Extremity Assessment Lower Extremity Assessment: Defer to PT evaluation   Cervical / Trunk Assessment Cervical / Trunk Assessment: Normal   Communication Communication Communication: No difficulties   Cognition Arousal/Alertness: Awake/alert Behavior During Therapy: Anxious Overall Cognitive Status: Within Functional Limits for tasks assessed  General Comments       Exercises Exercises: Other exercises Other Exercises Other Exercises: pursed lip breathing Other Exercises: incentive spirometer x 10   Shoulder Instructions      Home Living Family/patient expects to be discharged to:: Private residence Living Arrangements: Spouse/significant other Available Help at Discharge: Family;Available PRN/intermittently Type of Home: House Home Access: Stairs to enter Entrance Stairs-Number of Steps: 2   Home Layout: Two level;Able to live on main level with  bedroom/bathroom     Bathroom Shower/Tub: Occupational psychologist: Standard Bathroom Accessibility: Yes("tight fit") How Accessible: Accessible via walker Home Equipment: [place] - 4 wheels;Shower seat;Hand held shower head          Prior Functioning/Environment Level of Independence: Independent with assistive device(s)                 OT Problem List: Decreased activity tolerance;Decreased safety awareness;Decreased knowledge of use of DME or AE;Cardiopulmonary status limiting activity;Obesity;Pain      OT Treatment/Interventions: Self-care/ADL training;Therapeutic exercise;Energy conservation;DME and/or AE instruction;Therapeutic activities;Patient/family education    OT Goals(Current goals can be found in the care plan section) Acute Rehab OT Goals Patient Stated Goal: to return home, improve breathing OT Goal Formulation: With patient Time For Goal Achievement: 12/09/18 Potential to Achieve Goals: Good  OT Frequency: Min 2X/week   Barriers to D/C:            Co-evaluation              AM-PAC OT "6 Clicks" Daily Activity     Outcome Measure Help from another person eating meals?: None Help from another person taking care of personal grooming?: A Little Help from another person toileting, which includes using toliet, bedpan, or urinal?: A Little Help from another person bathing (including washing, rinsing, drying)?: A Little Help from another person to put on and taking off regular upper body clothing?: A Little Help from another person to put on and taking off regular lower body clothing?: A Little 6 Click Score: 19   End of Session Equipment Utilized During Treatment: Rolling walker;Oxygen(4L) Nurse Communication: Mobility status  Activity Tolerance: Patient limited by fatigue(DOE) Patient left: in chair  OT Visit Diagnosis: Muscle weakness (generalized) (M62.81);Pain Pain - part of body: (chest with breathing)                Time:  6063-0160 OT Time Calculation (min): 29 min Charges:  OT General Charges $OT Visit: 1 Visit OT Evaluation $OT Eval Moderate Complexity: 1 Mod OT Treatments $Self Care/Home Management : 8-22 mins  Maurie Boettcher, OT/L   Acute OT Clinical Specialist Acute Rehabilitation Services Pager [phone] Office [phone]   [facility] 11/25/2018, 11:28 AM

## 2018-11-25 NOTE — Evaluation (Signed)
Physical Therapy Evaluation Patient Details Name: Suzanne Dickson MRN: [id number] DOB: [birth year] Today's Date: 11/25/2018   History of Present Illness  Patient is a 73 year old female with 2 week history of SOB. PMH includes, HTN, COPD, lung CA, home O2.  Clinical Impression  Patient received sitting on edge of bed. Notably SOB. Patient reports she just finished using BSC. Very pleasant and agrees to PT assessment. Patient requires supervision to min guard for all mobility. Ambulated 20 feet x 2 with seated rest between using RW. She is limited by desaturation and SOB with activity (desats to low 80%, high 70% with ambulation). She will benefit from continued skilled PT while here to improve mobility and activity tolerance.      Follow Up Recommendations Home health PT    Equipment Recommendations  None recommended by PT    Recommendations for Other Services       Precautions / Restrictions Precautions Precautions: Fall Restrictions Weight Bearing Restrictions: No      Mobility  Bed Mobility               General bed mobility comments: received with patient sitting on side of bed  Transfers Overall transfer level: Needs assistance   Transfers: Sit to/from Stand Sit to Stand: Supervision            Ambulation/Gait Ambulation/Gait assistance: Supervision Gait Distance (Feet): 40 Feet Assistive device: Rolling walker (2 wheeled) Gait Pattern/deviations: Step-through pattern Gait velocity: WNL   General Gait Details: safe with ambulation, however O2 sats drop with 10-20 feet.  Stairs            Wheelchair Mobility    Modified Rankin (Stroke Patients Only)       Balance Overall balance assessment: Needs assistance Sitting-balance support: Feet supported Sitting balance-Leahy Scale: Good     Standing balance support: Bilateral upper extremity supported;During functional activity Standing balance-Leahy Scale: Fair Standing balance comment:  supervision to min guard for safety                             Pertinent Vitals/Pain Pain Assessment: No/denies pain    Home Living Family/patient expects to be discharged to:: Private residence Living Arrangements: Spouse/significant other Available Help at Discharge: Family;Available PRN/intermittently   Home Access: Stairs to enter   Entrance Stairs-Number of Steps: 2 Home Layout: Two level;Able to live on main level with bedroom/bathroom Home Equipment: Walker - 4 wheels;Shower seat      Prior Function Level of Independence: Independent with assistive device(s)               Hand Dominance        Extremity/Trunk Assessment   Upper Extremity Assessment Upper Extremity Assessment: Defer to OT evaluation    Lower Extremity Assessment Lower Extremity Assessment: Generalized weakness    Cervical / Trunk Assessment Cervical / Trunk Assessment: Normal  Communication   Communication: No difficulties  Cognition Arousal/Alertness: Awake/alert Behavior During Therapy: WFL for tasks assessed/performed Overall Cognitive Status: Within Functional Limits for tasks assessed                                        General Comments      Exercises     Assessment/Plan    PT Assessment Patient needs continued PT services  PT Problem List Decreased strength;Decreased mobility;Decreased activity tolerance;Cardiopulmonary status  limiting activity       PT Treatment Interventions Therapeutic activities;Gait training;Therapeutic exercise;Patient/family education;Stair training;Functional mobility training    PT Goals (Current goals can be found in the Care Plan section)  Acute Rehab PT Goals Patient Stated Goal: to return home, improve breathing PT Goal Formulation: With patient Time For Goal Achievement: 12/01/18 Potential to Achieve Goals: Good    Frequency Min 3X/week   Barriers to discharge        Co-evaluation                AM-PAC PT "6 Clicks" Mobility  Outcome Measure Help needed turning from your back to your side while in a flat bed without using bedrails?: None Help needed moving from lying on your back to sitting on the side of a flat bed without using bedrails?: A Little Help needed moving to and from a bed to a chair (including a wheelchair)?: A Little Help needed standing up from a chair using your arms (e.g., wheelchair or bedside chair)?: A Little Help needed to walk in hospital room?: A Little Help needed climbing 3-5 steps with a railing? : A Little 6 Click Score: 19    End of Session Equipment Utilized During Treatment: Gait belt;Oxygen Activity Tolerance: Patient tolerated treatment well;Other (comment)(limited by SOB and desaturation) Patient left: in bed;with call bell/phone within reach Nurse Communication: Mobility status PT Visit Diagnosis: Difficulty in walking, not elsewhere classified (R26.2)    Time: 6384-6659 PT Time Calculation (min) (ACUTE ONLY): 25 min   Charges:   PT Evaluation $PT Eval Moderate Complexity: 1 Mod PT Treatments $Gait Training: 8-22 mins        Ellison Rieth, PT, GCS 11/25/18,10:08 AM

## 2018-11-25 NOTE — Discharge Summary (Addendum)
Physician Discharge Summary  Suzanne Dickson IOE:[id number] DOB: [birth year] DOA: 11/24/2018  PCP: [name], MD  Admit date: 11/24/2018 Discharge date: 11/25/2018  Admitted From: Home Disposition: Home  Recommendations for Outpatient Follow-up:  1. Follow up with PCP in 1-2 weeks 2. Please obtain BMP/CBC in one week 3. Please follow up on the following pending results:  Home Health: Yes Equipment/Devices: Oxygen cylinder portable.  3 in 1 commode  Discharge Condition: Stable CODE STATUS: Full code Diet recommendation: Cardiac  Subjective: Seen and examined.  Feels much better than yesterday.  No new complaint.  Brief/Interim Summary: Suzanne Dickson is a 73 y.o. 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  Complains of having a productive cough with thick greenish-yellow sputum.  Associated symptoms include wheezing and coughing up intermittent blood-tinged sputum.  Denies having any significant fever, chills, chest pain, change in weight, significant leg swelling, nausea, vomiting, diarrhea, or dysuria.  She had been using her albuterol inhaler every 4 hours without any relief of symptoms yesterday.  Patient follows with Dr. Melvyn Novas of pulmonology in outpatient setting and had recently been started on prednisone which she finished about a month ago. Patient called 911 this morning due to her worsening of her symptoms.En route with EMS patient was noted to have O2 saturations of 82%.  Initially placed on a nonrebreather mask, but able to be weaned down to 4 L nasal cannula oxygen.Upon arrival to the emergency department patient was noted to be afebrile, respirations 17-23,  O2 saturations maintained on 4 L of nasal cannula oxygen, and all other vital signs maintained.   Labs revealed WBC 10.6, hemoglobin 8.3, BUN 20, creatinine 0.98, and high-sensitivity troponin negative.  Chest x-ray was noted to be stable in appearance.  Patient was given albuterol/Combivent inhaler, doxycycline, and 125mg  Solu-Medrol IV.  She was continued on doxycycline and oral prednisone.  Due to history of hemoptysis, CT angiogram of the chest was obtained which ruled out PE.  She has not had any hemoptysis episode during this hospitalization.  CT chest showed possible pulmonary artery hypertension.  When seen this morning.  She is feeling much better.  She thinks she is very close to her baseline.  She told me that she has approximately either 50 feet long or 50 yards long hose which she gets her oxygen through oxygen cylinder. She also understands that such a long hose could be an issue where she could not be getting enough oxygen due to possible kinks.  She is feeling better.  She is down to 2 L nasal cannula oxygen now.  She has very minimal wheezing on the left lower lobe.  I have consulted case manager to help her get a portable cylinder.  She is willing to go home.  I am discharging her on doxycycline for more days and prednisone for more days as well.  She was evaluated by PT OT and they recommended home health which has been ordered for her.  Discharge Diagnoses:  Active Problems:   Essential hypertension   Adenocarcinoma of left lung, stage 1 (HCC)   COPD exacerbation (HCC)   Acute on chronic respiratory failure with hypoxia (HCC)   Leukocytosis   Iron deficiency anemia due to chronic blood loss   Prolonged Q-T interval on ECG   Pulmonary arterial hypertension ([place])  Discharge Instructions  Discharge Instructions    Discharge patient   Complete by: As directed    Discharge disposition: 01-Home or Self Care   Discharge patient date: 11/25/2018     Allergies as of 11/25/2018   No Known Allergies     Medication List    TAKE these medications   albuterol (2.5 MG/3ML)  0.083% nebulizer solution Commonly known as: PROVENTIL Take 3 mLs (2.5 mg total) by nebulization every 4 (four) hours as needed. For shortness of breath What changed:   reasons to take this  additional instructions   albuterol 108 (90 Base) MCG/ACT inhaler Commonly known as: VENTOLIN HFA INHALE 2 PUFFS INTO THE LUNGS EVERY 4 HOURS AS NEEDED FOR WHEEZING OR SHORTNESS OF BREATH What changed: See the new instructions.   amLODipine 5 MG tablet Commonly known as: NORVASC Take 1 tablet (5 mg total) by mouth daily. What changed: when to take this   aspirin EC 81 MG tablet Take 81 mg by mouth every morning.   cetirizine 10 MG tablet Commonly known as: ZYRTEC Take 10 mg by mouth at bedtime.   clopidogrel 75 MG tablet Commonly known as: PLAVIX Take 75 mg by mouth every morning.   doxycycline 100 MG tablet Commonly known as: VIBRA-TABS Take 1 tablet (100 mg total) by mouth every 12 (twelve) hours for 5 days.   ferrous sulfate 325 (65 FE) MG EC tablet Take 325 mg by mouth 2 (two) times daily with a meal.   Fluticasone-Umeclidin-Vilant 100-62.5-25 MCG/INH Aepb Commonly known as: Trelegy Ellipta Inhale 1 puff into the lungs daily. What changed: when to take this   GUAIFENESIN ER PO Take 1 tablet by mouth 2 (two) times daily.   ibuprofen 200 MG tablet Commonly known as: ADVIL Take 400 mg by mouth every 6 (six) hours as needed (pain).   losartan 50 MG tablet Commonly known as: COZAAR Take 50 mg by mouth every morning.   metoprolol succinate 25 MG 24 hr tablet Commonly known as: TOPROL-XL Take 25 mg by mouth every morning.   multivitamin with minerals Tabs tablet Take 1 tablet by mouth every morning.   nitroGLYCERIN 0.4 MG SL tablet Commonly known as: NITROSTAT Place 0.4 mg under the tongue every 5 (five) minutes as needed for chest pain.   nitroGLYCERIN 0.4 mg/hr patch Commonly known as: NITRODUR - Dosed in mg/24 hr Place 0.4 mg onto the skin every morning.   OVER  THE COUNTER MEDICATION Take 1 tablet by mouth at bedtime. OTC sleep aid - unknown ingredients   OXYGEN Inhale 2-4 L/min into the lungs See admin instructions. 2-3 lpm at rest and 4 lpm with exertion   pantoprazole 40 MG tablet Commonly known as: PROTONIX Take 30-60 min before first meal of the day What changed:   how much to take  how to take this  when to take this  additional instructions   predniSONE 50 MG tablet Commonly known as: DELTASONE Take 1 tablet (50 mg total) by mouth daily with breakfast for 4 days. Start taking on: November 26, 2018   REFRESH OP Place 1 drop into both eyes daily as needed (dry eyes).   sertraline 100 MG tablet Commonly known as: ZOLOFT Take 100 mg by mouth 2 (two) times daily.      Follow-up Information    [name], MD Follow up in 1 week(s).   Specialty: Cardiology Contact information: [place] [address] [place] [address] [phone]  No Known Allergies  Consultations: None   Procedures/Studies: Ct Angio Chest Pe W And/or Wo Contrast  Result Date: 11/24/2018 CLINICAL DATA:  Shortness of breath. EXAM: CT ANGIOGRAPHY CHEST WITH CONTRAST TECHNIQUE: Multidetector CT imaging of the chest was performed using the standard protocol during bolus administration of intravenous contrast. Multiplanar CT image reconstructions and MIPs were obtained to evaluate the vascular anatomy. CONTRAST:  181mL OMNIPAQUE IOHEXOL 350 MG/ML SOLN COMPARISON:  August 25, 2018. FINDINGS: Cardiovascular: Satisfactory opacification of the pulmonary arteries to the segmental level. No evidence of pulmonary embolism. Normal heart size. No pericardial effusion. Coronary artery calcifications are noted. Enlargement of pulmonary arteries are noted suggesting pulmonary artery hypertension. Atherosclerosis of thoracic aorta is noted without aneurysm formation. Mediastinum/Nodes: Stable 1.7 cm right thyroid nodule. Esophagus is unremarkable.  No significant adenopathy is noted. Lungs/Pleura: No pneumothorax or pleural effusion is noted. Stable densities are noted in the left upper and lower lobes most consistent with post radiation change. Stable irregular density is noted anteriorly in the right upper lobe most consistent with scarring or fibrosis. Right lower lobe nodule noted on prior exam is not well visualized currently. Upper Abdomen: No acute abnormality. Musculoskeletal: No chest wall abnormality. No acute or significant osseous findings. Review of the MIP images confirms the above findings. IMPRESSION: No definite evidence of pulmonary embolus. Coronary artery calcifications are noted. Enlargement of pulmonary arteries are noted suggesting pulmonary artery hypertension. Stable 1.7 cm right thyroid nodule. Stable densities are noted in the left upper and lower lobes most consistent with post radiation change. Stable right upper lobe scarring or fibrosis is noted. Aortic Atherosclerosis (ICD10-I70.0). Electronically Signed   By: Marijo Conception M.D.   On: 11/24/2018 11:55   Dg Chest Port 1 View  Result Date: 11/24/2018 CLINICAL DATA:  73 year old female with history of shortness of breath. Weakness. EXAM: PORTABLE CHEST 1 VIEW COMPARISON:  Chest x-ray 10/19/2017. FINDINGS: Lung volumes are normal. No consolidative airspace disease. No pleural effusions. Linear area of architectural distortion in the left mid lung adjacent to surgical clips, similar to the prior study, compatible with a combination of postoperative and postradiation scarring. No definite suspicious appearing pulmonary nodules or masses are noted on today's plain film examination. No evidence of pulmonary edema. Heart size is upper limits of normal. Upper mediastinal contours are within normal limits. Aortic atherosclerosis. IMPRESSION: 1. Stable radiographic appearance the chest, as above, without findings to suggest acute cardiopulmonary disease. 2. Aortic atherosclerosis.  Electronically Signed   By: Vinnie Langton M.D.   On: 11/24/2018 07:47     Discharge Exam: Vitals:   11/25/18 0814 11/25/18 1107  BP:  135/79  Pulse:  81  Resp:  18  Temp:  98.8 F (37.1 C)  SpO2: 97% 94%   Vitals:   11/24/18 2317 11/25/18 0608 11/25/18 0814 11/25/18 1107  BP: (!) 152/93 (!) 151/96  135/79  Pulse: 88 85  81  Resp: 18 17  18   Temp: 98.1 F (36.7 C) (!) 97.5 F (36.4 C)  98.8 F (37.1 C)  TempSrc: Oral Oral    SpO2: 94% 96% 97% 94%  Weight:      Height:        General: Pt is alert, awake, not in acute distress Cardiovascular: RRR, S1/S2 +, no rubs, no gallops Respiratory: Left lower lobe expiratory wheeze.  No crackles or rhonchi. Abdominal: Soft, NT, ND, bowel sounds + Extremities: no edema, no cyanosis    The results of significant diagnostics from this hospitalization (including  imaging, microbiology, ancillary and laboratory) are listed below for reference.     Microbiology: Recent Results (from the past 240 hour(s))  Blood culture (routine x 2)     Status: None (Preliminary result)   Collection Time: 11/24/18 10:17 AM   Specimen: BLOOD  Result Value Ref Range Status   Specimen Description BLOOD RIGHT ANTECUBITAL  Final   Special Requests   Final    BOTTLES DRAWN AEROBIC AND ANAEROBIC Blood Culture adequate volume   Culture   Final    NO GROWTH < 24 HOURS Performed at [facility], [address], [place], [place] [address]    Report Status PENDING  Incomplete  SARS CORONAVIRUS 2 (TAT 6-24 HRS) Nasopharyngeal Nasopharyngeal Swab     Status: None   Collection Time: 11/24/18 10:17 AM   Specimen: Nasopharyngeal Swab  Result Value Ref Range Status   SARS Coronavirus 2 NEGATIVE NEGATIVE Final    Comment: (NOTE) SARS-CoV-2 target nucleic acids are NOT DETECTED. The SARS-CoV-2 RNA is generally detectable in upper and lower respiratory specimens during the acute phase of infection. Negative results do not preclude SARS-CoV-2 infection,  do not rule out co-infections with other pathogens, and should not be used as the sole basis for treatment or other patient management decisions. Negative results must be combined with clinical observations, patient history, and epidemiological information. The expected result is Negative. Fact Sheet for Patients: SugarRoll.be Fact Sheet for Healthcare Providers: https://www.woods-mathews.com/ This test is not yet approved or cleared by the [place] FDA and  has been authorized for detection and/or diagnosis of SARS-CoV-2 by FDA under an Emergency Use Authorization (EUA). This EUA will remain  in effect (meaning this test can be used) for the duration of the COVID-19 declaration under Section 56 4(b)(1) of the Act, 21 U.S.C. section 360bbb-3(b)(1), unless the authorization is terminated or revoked sooner. Performed at [facility], [place] [address], [place], [place] [address]   Blood culture (routine x 2)     Status: None (Preliminary result)   Collection Time: 11/24/18 10:22 AM   Specimen: BLOOD  Result Value Ref Range Status   Specimen Description BLOOD LEFT ANTECUBITAL  Final   Special Requests   Final    BOTTLES DRAWN AEROBIC AND ANAEROBIC Blood Culture adequate volume   Culture   Final    NO GROWTH < 24 HOURS Performed at [facility], [place] [address] [place], [place] [address]    Report Status PENDING  Incomplete     Labs: BNP (last 3 results) No results for input(s): BNP in the last 8760 hours. Basic Metabolic Panel: Recent Labs  Lab 11/24/18 0726 11/25/18 0457  NA 142 140  K 3.8 4.0  CL 111 107  CO2 23 21*  GLUCOSE 111* 219*  BUN 20 19  CREATININE 0.98 1.03*  CALCIUM 9.0 9.1   Liver Function Tests: No results for input(s): AST, ALT, ALKPHOS, BILITOT, PROT, ALBUMIN in the last 168 hours. No results for input(s): LIPASE, AMYLASE in the last 168 hours. No results for input(s): AMMONIA in the last 168  hours. CBC: Recent Labs  Lab 11/24/18 0726 11/24/18 1216 11/25/18 0457  WBC 10.6*  --  8.8  NEUTROABS 9.0*  --   --   HGB 8.3* 8.1* 8.2*  HCT 28.7* 28.2* 28.1*  MCV 93.8  --  93.0  PLT 178  --  169   Cardiac Enzymes: No results for input(s): CKTOTAL, CKMB, CKMBINDEX, TROPONINI in the last 168 hours.  BNP: Invalid input(s): POCBNP CBG: No results for input(s): GLUCAP in the last 168 hours. D-Dimer No results for input(s): DDIMER in the last 72 hours. Hgb A1c No results for input(s): HGBA1C in the last 72 hours. Lipid Profile No results for input(s): CHOL, HDL, LDLCALC, TRIG, CHOLHDL, LDLDIRECT in the last 72 hours. Thyroid function studies No results for input(s): TSH, T4TOTAL, T3FREE, THYROIDAB in the last 72 hours.  Invalid input(s): FREET3 Anemia work up No results for input(s): VITAMINB12, FOLATE, FERRITIN, TIBC, IRON, RETICCTPCT in the last 72 hours. Urinalysis    Component Value Date/Time   COLORURINE YELLOW 09/02/2014 2138   APPEARANCEUR CLEAR 09/02/2014 2138   LABSPEC 1.019 09/02/2014 2138   PHURINE 5.0 09/02/2014 2138   GLUCOSEU NEGATIVE 09/02/2014 2138   HGBUR NEGATIVE 09/02/2014 2138   BILIRUBINUR NEGATIVE 09/02/2014 2138   KETONESUR NEGATIVE 09/02/2014 2138   PROTEINUR NEGATIVE 09/02/2014 2138   UROBILINOGEN 0.2 09/02/2014 2138   NITRITE NEGATIVE 09/02/2014 2138   LEUKOCYTESUR NEGATIVE 09/02/2014 2138   Sepsis Labs Invalid input(s): PROCALCITONIN,  WBC,  LACTICIDVEN Microbiology Recent Results (from the past 240 hour(s))  Blood culture (routine x 2)     Status: None (Preliminary result)   Collection Time: 11/24/18 10:17 AM   Specimen: BLOOD  Result Value Ref Range Status   Specimen Description BLOOD RIGHT ANTECUBITAL  Final   Special Requests   Final    BOTTLES DRAWN AEROBIC AND ANAEROBIC Blood Culture adequate volume   Culture   Final    NO GROWTH < 24 HOURS Performed at [facility], [address] [place], [place] [address]    Report  Status PENDING  Incomplete  SARS CORONAVIRUS 2 (TAT 6-24 HRS) Nasopharyngeal Nasopharyngeal Swab     Status: None   Collection Time: 11/24/18 10:17 AM   Specimen: Nasopharyngeal Swab  Result Value Ref Range Status   SARS Coronavirus 2 NEGATIVE NEGATIVE Final    Comment: (NOTE) SARS-CoV-2 target nucleic acids are NOT DETECTED. The SARS-CoV-2 RNA is generally detectable in upper and lower respiratory specimens during the acute phase of infection. Negative results do not preclude SARS-CoV-2 infection, do not rule out co-infections with other pathogens, and should not be used as the sole basis for treatment or other patient management decisions. Negative results must be combined with clinical observations, patient history, and epidemiological information. The expected result is Negative. Fact Sheet for Patients: SugarRoll.be Fact Sheet for Healthcare Providers: https://www.woods-mathews.com/ This test is not yet approved or cleared by the [place] FDA and  has been authorized for detection and/or diagnosis of SARS-CoV-2 by FDA under an Emergency Use Authorization (EUA). This EUA will remain  in effect (meaning this test can be used) for the duration of the COVID-19 declaration under Section 56 4(b)(1) of the Act, 21 U.S.C. section 360bbb-3(b)(1), unless the authorization is terminated or revoked sooner. Performed at [facility], [place] [address], [place], [place] [address]   Blood culture (routine x 2)     Status: None (Preliminary result)   Collection Time: 11/24/18 10:22 AM   Specimen: BLOOD  Result Value Ref Range Status   Specimen Description BLOOD LEFT ANTECUBITAL  Final   Special Requests   Final    BOTTLES DRAWN AEROBIC AND ANAEROBIC Blood Culture adequate volume   Culture   Final    NO GROWTH < 24 HOURS Performed at [facility], [place] [address] [place], [place] [address]    Report Status PENDING  Incomplete  Time coordinating discharge: Over 30 minutes  SIGNED:   Darliss Cheney, MD  [facility] 11/25/2018, 11:32 AM  If 7PM-7AM, please contact night-coverage [email or web address] Password [email or web address]

## 2018-11-29 LAB — CULTURE, BLOOD (ROUTINE X 2)
Culture: NO GROWTH
Culture: NO GROWTH
Special Requests: ADEQUATE
Special Requests: ADEQUATE

## 2018-12-11 ENCOUNTER — Other Ambulatory Visit: Payer: Self-pay | Admitting: Internal Medicine

## 2018-12-11 MED ORDER — ALBUTEROL SULFATE HFA 108 (90 BASE) MCG/ACT IN AERS: 2.0000 | INHALATION_SPRAY | RESPIRATORY_TRACT | 2 refills | Status: DC | PRN

## 2018-12-21 ENCOUNTER — Inpatient Hospital Stay (HOSPITAL_COMMUNITY)
Admission: EM | Admit: 2018-12-21 | Discharge: 2018-12-26 | DRG: 190 | Disposition: A | Discharge status: Home Health | Payer: Medicare Other | Attending: Internal Medicine | Admitting: Internal Medicine

## 2018-12-21 ENCOUNTER — Other Ambulatory Visit: Payer: Self-pay

## 2018-12-21 ENCOUNTER — Emergency Department (HOSPITAL_COMMUNITY): Payer: Medicare Other

## 2018-12-21 ENCOUNTER — Encounter (HOSPITAL_COMMUNITY): Payer: Self-pay

## 2018-12-21 DIAGNOSIS — D509 Iron deficiency anemia, unspecified: Secondary | ICD-10-CM | POA: Diagnosis present

## 2018-12-21 DIAGNOSIS — Z7982 Long term (current) use of aspirin: Secondary | ICD-10-CM

## 2018-12-21 DIAGNOSIS — Z7902 Long term (current) use of antithrombotics/antiplatelets: Secondary | ICD-10-CM

## 2018-12-21 DIAGNOSIS — Z955 Presence of coronary angioplasty implant and graft: Secondary | ICD-10-CM

## 2018-12-21 DIAGNOSIS — Z85118 Personal history of other malignant neoplasm of bronchus and lung: Secondary | ICD-10-CM

## 2018-12-21 DIAGNOSIS — Z9981 Dependence on supplemental oxygen: Secondary | ICD-10-CM

## 2018-12-21 DIAGNOSIS — I2510 Atherosclerotic heart disease of native coronary artery without angina pectoris: Secondary | ICD-10-CM

## 2018-12-21 DIAGNOSIS — D638 Anemia in other chronic diseases classified elsewhere: Secondary | ICD-10-CM

## 2018-12-21 DIAGNOSIS — K219 Gastro-esophageal reflux disease without esophagitis: Secondary | ICD-10-CM | POA: Diagnosis present

## 2018-12-21 DIAGNOSIS — R739 Hyperglycemia, unspecified: Secondary | ICD-10-CM | POA: Diagnosis not present

## 2018-12-21 DIAGNOSIS — F410 Panic disorder [episodic paroxysmal anxiety] without agoraphobia: Secondary | ICD-10-CM | POA: Diagnosis present

## 2018-12-21 DIAGNOSIS — J9621 Acute and chronic respiratory failure with hypoxia: Secondary | ICD-10-CM | POA: Diagnosis not present

## 2018-12-21 DIAGNOSIS — G4733 Obstructive sleep apnea (adult) (pediatric): Secondary | ICD-10-CM

## 2018-12-21 DIAGNOSIS — Z23 Encounter for immunization: Secondary | ICD-10-CM

## 2018-12-21 DIAGNOSIS — J441 Chronic obstructive pulmonary disease with (acute) exacerbation: Secondary | ICD-10-CM | POA: Diagnosis present

## 2018-12-21 DIAGNOSIS — M1990 Unspecified osteoarthritis, unspecified site: Secondary | ICD-10-CM | POA: Diagnosis present

## 2018-12-21 DIAGNOSIS — Z8249 Family history of ischemic heart disease and other diseases of the circulatory system: Secondary | ICD-10-CM

## 2018-12-21 DIAGNOSIS — J9611 Chronic respiratory failure with hypoxia: Secondary | ICD-10-CM | POA: Diagnosis present

## 2018-12-21 DIAGNOSIS — Z87891 Personal history of nicotine dependence: Secondary | ICD-10-CM

## 2018-12-21 DIAGNOSIS — Z20828 Contact with and (suspected) exposure to other viral communicable diseases: Secondary | ICD-10-CM | POA: Diagnosis present

## 2018-12-21 DIAGNOSIS — T380X5A Adverse effect of glucocorticoids and synthetic analogues, initial encounter: Secondary | ICD-10-CM | POA: Diagnosis not present

## 2018-12-21 DIAGNOSIS — F329 Major depressive disorder, single episode, unspecified: Secondary | ICD-10-CM | POA: Diagnosis present

## 2018-12-21 DIAGNOSIS — F419 Anxiety disorder, unspecified: Secondary | ICD-10-CM | POA: Diagnosis present

## 2018-12-21 DIAGNOSIS — Z79899 Other long term (current) drug therapy: Secondary | ICD-10-CM

## 2018-12-21 DIAGNOSIS — Z923 Personal history of irradiation: Secondary | ICD-10-CM

## 2018-12-21 DIAGNOSIS — Z8701 Personal history of pneumonia (recurrent): Secondary | ICD-10-CM

## 2018-12-21 DIAGNOSIS — R61 Generalized hyperhidrosis: Secondary | ICD-10-CM | POA: Diagnosis present

## 2018-12-21 DIAGNOSIS — I10 Essential (primary) hypertension: Secondary | ICD-10-CM | POA: Diagnosis present

## 2018-12-21 LAB — COMPREHENSIVE METABOLIC PANEL
ALT: 22 U/L (ref 0–44)
AST: 28 U/L (ref 15–41)
Albumin: 3.8 g/dL (ref 3.5–5.0)
Alkaline Phosphatase: 59 U/L (ref 38–126)
Anion gap: 10 (ref 5–15)
BUN: 17 mg/dL (ref 8–23)
CO2: 25 mmol/L (ref 22–32)
Calcium: 9.2 mg/dL (ref 8.9–10.3)
Chloride: 107 mmol/L (ref 98–111)
Creatinine, Ser: 1.23 mg/dL — ABNORMAL HIGH (ref 0.44–1.00)
GFR calc Af Amer: 50 mL/min — ABNORMAL LOW (ref >60)
GFR calc non Af Amer: 43 mL/min — ABNORMAL LOW (ref >60)
Glucose, Bld: 153 mg/dL — ABNORMAL HIGH (ref 70–99)
Potassium: 4.2 mmol/L (ref 3.5–5.1)
Sodium: 142 mmol/L (ref 135–145)
Total Bilirubin: 0.7 mg/dL (ref 0.3–1.2)
Total Protein: 6.9 g/dL (ref 6.5–8.1)

## 2018-12-21 LAB — CBC WITH DIFFERENTIAL/PLATELET
Abs Immature Granulocytes: 0.07 10*3/uL (ref 0.00–0.07)
Basophils Absolute: 0 10*3/uL (ref 0.0–0.1)
Basophils Relative: 0 %
Eosinophils Absolute: 0.2 10*3/uL (ref 0.0–0.5)
Eosinophils Relative: 1 %
HCT: 30.4 % — ABNORMAL LOW (ref 36.0–46.0)
Hemoglobin: 8.6 g/dL — ABNORMAL LOW (ref 12.0–15.0)
Immature Granulocytes: 1 %
Lymphocytes Relative: 22 %
Lymphs Abs: 3 10*3/uL (ref 0.7–4.0)
MCH: 26.1 pg (ref 26.0–34.0)
MCHC: 28.3 g/dL — ABNORMAL LOW (ref 30.0–36.0)
MCV: 92.1 fL (ref 80.0–100.0)
Monocytes Absolute: 1.1 10*3/uL — ABNORMAL HIGH (ref 0.1–1.0)
Monocytes Relative: 8 %
Neutro Abs: 9.3 10*3/uL — ABNORMAL HIGH (ref 1.7–7.7)
Neutrophils Relative %: 68 %
Platelets: 284 10*3/uL (ref 150–400)
RBC: 3.3 MIL/uL — ABNORMAL LOW (ref 3.87–5.11)
RDW: 14.6 % (ref 11.5–15.5)
WBC: 13.7 10*3/uL — ABNORMAL HIGH (ref 4.0–10.5)
nRBC: 0 % (ref 0.0–0.2)

## 2018-12-21 LAB — BRAIN NATRIURETIC PEPTIDE: B Natriuretic Peptide: 127.3 pg/mL — ABNORMAL HIGH (ref 0.0–100.0)

## 2018-12-21 MED ORDER — IPRATROPIUM-ALBUTEROL 0.5-2.5 (3) MG/3ML IN SOLN
3.0000 mL | RESPIRATORY_TRACT | Status: AC
  Administered 2018-12-21 (×3): 3 mL via RESPIRATORY_TRACT
  Filled 2018-12-21: qty 6

## 2018-12-21 MED ORDER — DOXYCYCLINE HYCLATE 100 MG PO TABS
100.0000 mg | ORAL_TABLET | Freq: Once | ORAL | Status: AC
  Administered 2018-12-22: 100 mg via ORAL
  Filled 2018-12-21: qty 1

## 2018-12-21 MED ORDER — SODIUM CHLORIDE 0.9 % IV SOLN
1.0000 g | Freq: Once | INTRAVENOUS | Status: AC
  Administered 2018-12-22: 1 g via INTRAVENOUS
  Filled 2018-12-21: qty 10

## 2018-12-21 MED ORDER — IPRATROPIUM-ALBUTEROL 0.5-2.5 (3) MG/3ML IN SOLN
3.0000 mL | RESPIRATORY_TRACT | Status: AC
  Administered 2018-12-21 (×3): 3 mL via RESPIRATORY_TRACT
  Filled 2018-12-21 (×2): qty 3
  Filled 2018-12-21: qty 6

## 2018-12-21 NOTE — ED Triage Notes (Signed)
Pt from home with ems with respiratory distress. Pt having worsening SOB over the past 3 days. Pt usually wears 2-4L [place] at home. Pt arrives on 6L [place] with saturations at 95%. EMS reports was diaphoretic and wheezing upon their arrival. Pt given 0.3 Epi IM, 125 Solu Medrol, 2mg  Magnesium and 2 puffs of her home inhaler PTA. Pt arrives to ED with increased work of breathing on 6L [place] 98% Patient alert

## 2018-12-21 NOTE — ED Provider Notes (Signed)
[facility] EMERGENCY DEPARTMENT Provider Note   CSN: [id number] Arrival date & time: 12/21/18  2149     History   Chief Complaint Chief Complaint  Patient presents with  . Respiratory Distress    HPI Suzanne Dickson is a 72 y.o. female.     HPI  73 y.o.femalewith medical history significant ofCOPD (O2 4L [place] at home), adenocarcinoma of the left lungs/p radiotherapy, HTN, anxiety, and depression who presents with shortness of breath.  Per EMS, patient has had worsening shortness of breath over the last 3 days.  Shortness of breath is worse with exertion.  Patient was placed on 6 L nasal cannula for work of breathing with EMS.  She was given IV Solu-Medrol, 0.3 IM epi as well as 2 of magnesium.  Patient also used her albuterol inhaler prior to arrival.  She endorses cough productive of yellow sputum.  Denies any chest pain.  No fever.  No known sick contacts.  No leg swelling.  Past Medical History:  Diagnosis Date  . Adenocarcinoma of left lung, stage 1 ([place]) 04/15/2016   "I'm in remission" (04/05/2017)  . Anxiety   . Arthritis    "legs, elbows" (04/05/2017)  . Bronchitis, chronic ([place])    "haven't had it in awhile" (04/05/2017)  . CHF (congestive heart failure) ([place])   . COPD (chronic obstructive pulmonary disease) ([place])   . Daily headache    "recently" (04/05/2017)  . Depression   . Dyspnea    with exertion  . GERD (gastroesophageal reflux disease)   . History of blood transfusion 1950s  . History of radiation therapy 05/04/16 - 05/12/16   Left lung treated to 54 Gy with 3 fx of 18 Gy  . Hypertension   . On home oxygen therapy    "2L; 24/7" (04/05/2017)  . OSA on CPAP   . Pneumonia 10/2015   "at least 3 times" (04/05/2017)    Patient Active Problem List   Diagnosis Date Noted  . OSA on CPAP   . CAD (coronary artery disease)   . Anemia of chronic disease   . Pulmonary arterial hypertension ([place]) 11/25/2018  . COPD exacerbation ([place]) 11/24/2018   . Acute on chronic respiratory failure with hypoxia ([place]) 11/24/2018  . Leukocytosis 11/24/2018  . Iron deficiency anemia due to chronic blood loss 11/24/2018  . Prolonged Q-T interval on ECG 11/24/2018  . Unstable angina ([place]) 04/05/2017  . Adenocarcinoma of left lung, stage 1 ([place]) 04/15/2016  . Solitary pulmonary nodule on lung CT 11/20/2015  . Atherosclerosis of native arteries of extremity with intermittent claudication ([place]) 10/23/2015  . COPD GOLD III/ 02 dep  10/10/2014  . Chronic respiratory failure with hypoxia ([place]) 10/10/2014  . Acute bronchitis 09/02/2014  . Essential hypertension 06/07/2009  . RHINOSINUSITIS, CHRONIC 06/04/2009    Past Surgical History:  Procedure Laterality Date  . COLONOSCOPY    . CORONARY ANGIOPLASTY WITH STENT PLACEMENT  04/05/2017  . CORONARY STENT INTERVENTION N/A 04/05/2017   Procedure: CORONARY STENT INTERVENTION;  Surgeon: [name], MD;  Location: [facility];  Service: Cardiovascular;  Laterality: N/A;  . DILATION AND CURETTAGE OF UTERUS    . FUDUCIAL PLACEMENT Left 03/31/2016   Procedure: PLACEMENT OF FUDUCIAL LEFT UPPER LOBE;  Surgeon: Collene Gobble, MD;  Location: [facility];  Service: Thoracic;  Laterality: Left;  . LAPAROSCOPIC CHOLECYSTECTOMY    . LEFT HEART CATH AND CORONARY ANGIOGRAPHY N/A 02/17/2017   Procedure: LEFT HEART CATH AND CORONARY ANGIOGRAPHY;  Surgeon: [name], MD;  Location: [facility];  Service: Cardiovascular;  Laterality: N/A;  . SINUSOTOMY    . TONSILLECTOMY    . TUBAL LIGATION    . VIDEO BRONCHOSCOPY WITH ENDOBRONCHIAL NAVIGATION N/A 03/31/2016   Procedure: VIDEO BRONCHOSCOPY WITH ENDOBRONCHIAL NAVIGATION;  Surgeon: Collene Gobble, MD;  Location: [facility];  Service: Thoracic;  Laterality: N/A;     OB History   No obstetric history on file.      Home Medications    Prior to Admission medications   Medication Sig Start Date End Date Taking? Authorizing Provider  albuterol (PROVENTIL) (2.5  MG/3ML) 0.083% nebulizer solution Take 3 mLs (2.5 mg total) by nebulization every 4 (four) hours as needed. For shortness of breath Patient taking differently: Take 2.5 mg by nebulization every 4 (four) hours as needed for wheezing or shortness of breath.  04/16/16  Yes Tanda Rockers, MD  albuterol (VENTOLIN HFA) 108 (90 Base) MCG/ACT inhaler Inhale 2 puffs into the lungs every 4 (four) hours as needed for wheezing or shortness of breath. 12/11/18  Yes Tanda Rockers, MD  amLODipine (NORVASC) 5 MG tablet Take 1 tablet (5 mg total) by mouth daily. Patient taking differently: Take 10 mg by mouth every morning.  10/18/16  Yes Tanda Rockers, MD  aspirin EC 81 MG tablet Take 81 mg by mouth every morning.   Yes [provider]  cetirizine (ZYRTEC) 10 MG tablet Take 10 mg by mouth at bedtime.   Yes [provider]  clopidogrel (PLAVIX) 75 MG tablet Take 75 mg by mouth every morning.    Yes [provider]  ferrous sulfate 325 (65 FE) MG EC tablet Take 325 mg by mouth 3 (three) times daily with meals.    Yes [provider]  Fluticasone-Umeclidin-Vilant (TRELEGY ELLIPTA) 100-62.5-25 MCG/INH AEPB Inhale 1 puff into the lungs daily. Patient taking differently: Inhale 1 puff into the lungs every morning.  02/17/18  Yes Tanda Rockers, MD  guaiFENesin (MUCINEX) 600 MG 12 hr tablet Take 600 mg by mouth every 12 (twelve) hours.    Yes [provider]  ibuprofen (ADVIL) 200 MG tablet Take 400 mg by mouth every 6 (six) hours as needed (pain).   Yes [provider]  loratadine (CLARITIN) 10 MG tablet Take 10 mg by mouth daily.   Yes [provider]  losartan (COZAAR) 50 MG tablet Take 50 mg by mouth every morning. 11/15/18  Yes [provider]  metoprolol succinate (TOPROL-XL) 25 MG 24 hr tablet Take 25 mg by mouth every morning.    Yes [provider]  nitroGLYCERIN (NITRODUR - DOSED IN MG/24 HR) 0.4 mg/hr patch Place 0.4 mg onto the  skin every morning.  08/10/17  Yes [provider]  nitroGLYCERIN (NITROSTAT) 0.4 MG SL tablet Place 0.4 mg under the tongue every 5 (five) minutes as needed for chest pain.   Yes [provider]  OVER THE COUNTER MEDICATION Take 1 tablet by mouth at bedtime. OTC sleep aid - unknown ingredients   Yes [provider]  OXYGEN Inhale 2-4 L/min into the lungs See admin instructions. 2-3 lpm at rest and 4 lpm with exertion   Yes [provider]  pantoprazole (PROTONIX) 40 MG tablet Take 30-60 min before first meal of the day Patient taking differently: Take 40 mg by mouth daily before breakfast.  08/08/18  Yes Tanda Rockers, MD  Polyvinyl Alcohol-Povidone (REFRESH OP) Place 1 drop into both  eyes daily as needed (dry eyes).   Yes [provider]  sertraline (ZOLOFT) 100 MG tablet Take 100 mg by mouth 2 (two) times daily. 05/18/16  Yes [provider]    Family History Family History  Problem Relation Age of Onset  . Heart attack Mother   . Heart attack Father     Social History Social History   Tobacco Use  . Smoking status: Former Smoker    Packs/day: 1.00    Years: 40.00    Pack years: 40.00    Types: Cigarettes    Quit date: 09/02/2014    Years since quitting: 4.3  . Smokeless tobacco: Never Used  Substance Use Topics  . Alcohol use: No    Alcohol/week: 0.0 standard drinks  . Drug use: Never     Allergies   Patient has no known allergies.   Review of Systems Review of Systems  Constitutional: Negative for chills and fever.  Eyes: Negative for pain and visual disturbance.  Respiratory: Positive for cough, shortness of breath and wheezing.   Cardiovascular: Negative for chest pain and palpitations.  Gastrointestinal: Negative for abdominal pain and vomiting.  Genitourinary: Negative for dysuria and hematuria.  Musculoskeletal: Negative for arthralgias and back pain.  Skin: Negative for color change and rash.   Neurological: Negative for seizures and syncope.  Psychiatric/Behavioral: Negative for agitation and behavioral problems.  All other systems reviewed and are negative.    Physical Exam Updated Vital Signs BP 124/83   Pulse (!) 104   Temp 98.8 F (37.1 C) (Oral)   Resp (!) 22   Ht 5\' 3"  (1.6 m)   Wt 70.8 kg   SpO2 97%   BMI 27.63 kg/m   Physical Exam Vitals signs and nursing note reviewed.  Constitutional:      General: She is not in acute distress.    Appearance: She is well-developed.  HENT:     Head: Normocephalic and atraumatic.  Eyes:     Conjunctiva/sclera: Conjunctivae normal.  Neck:     Musculoskeletal: Neck supple.  Cardiovascular:     Rate and Rhythm: Normal rate and regular rhythm.     Heart sounds: No murmur.  Pulmonary:     Effort: Respiratory distress (Increased work of breathing, using accessory muscles) present.     Breath sounds: Wheezing (Diffuse inspiratory and expiratory wheezing) present.  Abdominal:     Palpations: Abdomen is soft.     Tenderness: There is no abdominal tenderness.  Skin:    General: Skin is warm and dry.  Neurological:     Mental Status: She is alert.      ED Treatments / Results  Labs (all labs ordered are listed, but only abnormal results are displayed) Labs Reviewed  CBC WITH DIFFERENTIAL/PLATELET - Abnormal; Notable for the following components:      Result Value   WBC 13.7 (*)    RBC 3.30 (*)    Hemoglobin 8.6 (*)    HCT 30.4 (*)    MCHC 28.3 (*)    Neutro Abs 9.3 (*)    Monocytes Absolute 1.1 (*)    All other components within normal limits  COMPREHENSIVE METABOLIC PANEL - Abnormal; Notable for the following components:   Glucose, Bld 153 (*)    Creatinine, Ser 1.23 (*)    GFR calc non Af Amer 43 (*)    GFR calc Af Amer 50 (*)    All other components within normal limits  BRAIN NATRIURETIC PEPTIDE - Abnormal; Notable  for the following components:   B Natriuretic Peptide 127.3 (*)    All other components  within normal limits  SARS CORONAVIRUS 2 (TAT 6-24 HRS)  PROCALCITONIN  CBC  BASIC METABOLIC PANEL    EKG EKG Interpretation  Date/Time:  Thursday December 21 2018 21:56:19 EST Ventricular Rate:  101 PR Interval:    QRS Duration: 70 QT Interval:  391 QTC Calculation: 507 R Axis:   84 Text Interpretation: Sinus tachycardia Borderline right axis deviation Borderline repolarization abnormality Prolonged QT interval Confirmed by Quintella Reichert [phone]) on 12/21/2018 10:15:04 PM   Radiology Dg Chest Portable 1 View  Result Date: 12/21/2018 CLINICAL DATA:  Shortness of breath EXAM: PORTABLE CHEST 1 VIEW COMPARISON:  Radiograph and CT 11/24/2018 FINDINGS: Postsurgical change in the left mid lung with stable bandlike region of scarring. Question some developing patchy opacity in the left infrahilar lung slightly increased from comparison study. No pneumothorax. No visible effusion. The aorta is calcified. The remaining cardiomediastinal contours are unremarkable. No acute osseous or soft tissue abnormality. IMPRESSION: 1. Question developing patchy opacity in the left infrahilar lung. Could reflect atelectasis or developing infection. 2. Stable postoperative changes in the left mid lung with stable bandlike region of scarring. 3.  Aortic Atherosclerosis (ICD10-I70.0). Electronically Signed   By: Lovena Le M.D.   On: 12/21/2018 22:17    Procedures Procedures (including critical care time)  Medications Ordered in ED Medications  ipratropium-albuterol (DUONEB) 0.5-2.5 (3) MG/3ML nebulizer solution 3 mL (3 mLs Nebulization Given 12/21/18 2214)  enoxaparin (LOVENOX) injection 40 mg (has no administration in time range)  acetaminophen (TYLENOL) tablet 650 mg (has no administration in time range)    Or  acetaminophen (TYLENOL) suppository 650 mg (has no administration in time range)  albuterol (PROVENTIL) (2.5 MG/3ML) 0.083% nebulizer solution 2.5 mg (has no administration in time range)   guaiFENesin (MUCINEX) 12 hr tablet 600 mg (has no administration in time range)  ipratropium-albuterol (DUONEB) 0.5-2.5 (3) MG/3ML nebulizer solution 3 mL (has no administration in time range)  methylPREDNISolone sodium succinate (SOLU-MEDROL) 40 mg/mL injection 40 mg (has no administration in time range)  doxycycline (VIBRA-TABS) tablet 100 mg (has no administration in time range)  cefTRIAXone (ROCEPHIN) 1 g in sodium chloride 0.9 % 100 mL IVPB (has no administration in time range)  amLODipine (NORVASC) tablet 5 mg (has no administration in time range)  aspirin EC tablet 81 mg (has no administration in time range)  loratadine (CLARITIN) tablet 10 mg (has no administration in time range)  clopidogrel (PLAVIX) tablet 75 mg (has no administration in time range)  ferrous sulfate tablet 325 mg (has no administration in time range)  losartan (COZAAR) tablet 50 mg (has no administration in time range)  metoprolol succinate (TOPROL-XL) 24 hr tablet 25 mg (has no administration in time range)  sertraline (ZOLOFT) tablet 100 mg (has no administration in time range)  ipratropium-albuterol (DUONEB) 0.5-2.5 (3) MG/3ML nebulizer solution 3 mL (3 mLs Nebulization Given 12/21/18 2336)  cefTRIAXone (ROCEPHIN) 1 g in sodium chloride 0.9 % 100 mL IVPB (0 g Intravenous Stopped 12/22/18 0112)  doxycycline (VIBRA-TABS) tablet 100 mg (100 mg Oral Given 12/22/18 0011)     Initial Impression / Assessment and Plan / ED Course  I have reviewed the triage vital signs and the nursing notes.  Pertinent labs & imaging results that were available during my care of the patient were reviewed by me and considered in my medical decision making (see chart for details).  Suzanne Dickson was  evaluated in Emergency Department on 12/22/2018 for the symptoms described in the history of present illness. She was evaluated in the context of the global COVID-19 pandemic, which necessitated consideration that the patient might be at  risk for infection with the SARS-CoV-2 virus that causes COVID-19. Institutional protocols and algorithms that pertain to the evaluation of patients at risk for COVID-19 are in a state of rapid change based on information released by regulatory bodies including the CDC and federal and state organizations. These policies and algorithms were followed during the patient's care in the ED.     On arrival, patient is afebrile, mildly tachypneic, increased work of breathing.  Lung auscultation consistent with diffuse inspiratory and expiratory wheezing  Considered: COPD exacerbation, Viral URI, PNA, Covid-19 No evidence of peripheral fluid overload  EKG: Sinus tachycardia (HR 101), T wave inversion in lateral leads, similar when compared to prior CXR: ? Developing L opacity; will treat for CAP given symptoms  Patient given back to back DuoNebs. Upon reassessment, improvement of work of breathing with ongoing wheezing. Pt given additional DuoNebs. Given leukocytosis, will treat for CAP.  Overall, concern for COPD exacerbation in setting of infectious respiratory symptoms. Hospitalist contacted for admission; they have assumed care. Pt agreeable to admission. No further acute events.   Final Clinical Impressions(s) / ED Diagnoses   Final diagnoses:  COPD exacerbation [facility])    ED Discharge Orders    None       Burns Spain, MD 12/22/18 2336    Quintella Reichert, MD 12/23/18 1230

## 2018-12-22 ENCOUNTER — Observation Stay (HOSPITAL_COMMUNITY): Payer: Medicare Other

## 2018-12-22 ENCOUNTER — Encounter (HOSPITAL_COMMUNITY): Payer: Self-pay | Admitting: Internal Medicine

## 2018-12-22 DIAGNOSIS — I2510 Atherosclerotic heart disease of native coronary artery without angina pectoris: Secondary | ICD-10-CM | POA: Diagnosis present

## 2018-12-22 DIAGNOSIS — M1990 Unspecified osteoarthritis, unspecified site: Secondary | ICD-10-CM | POA: Diagnosis present

## 2018-12-22 DIAGNOSIS — K219 Gastro-esophageal reflux disease without esophagitis: Secondary | ICD-10-CM | POA: Diagnosis present

## 2018-12-22 DIAGNOSIS — J9621 Acute and chronic respiratory failure with hypoxia: Secondary | ICD-10-CM | POA: Diagnosis present

## 2018-12-22 DIAGNOSIS — Z7902 Long term (current) use of antithrombotics/antiplatelets: Secondary | ICD-10-CM | POA: Diagnosis not present

## 2018-12-22 DIAGNOSIS — J441 Chronic obstructive pulmonary disease with (acute) exacerbation: Secondary | ICD-10-CM | POA: Diagnosis present

## 2018-12-22 DIAGNOSIS — Z7982 Long term (current) use of aspirin: Secondary | ICD-10-CM | POA: Diagnosis not present

## 2018-12-22 DIAGNOSIS — T380X5A Adverse effect of glucocorticoids and synthetic analogues, initial encounter: Secondary | ICD-10-CM | POA: Diagnosis not present

## 2018-12-22 DIAGNOSIS — Z23 Encounter for immunization: Secondary | ICD-10-CM | POA: Diagnosis not present

## 2018-12-22 DIAGNOSIS — R609 Edema, unspecified: Secondary | ICD-10-CM | POA: Diagnosis not present

## 2018-12-22 DIAGNOSIS — R61 Generalized hyperhidrosis: Secondary | ICD-10-CM | POA: Diagnosis present

## 2018-12-22 DIAGNOSIS — I10 Essential (primary) hypertension: Secondary | ICD-10-CM | POA: Diagnosis not present

## 2018-12-22 DIAGNOSIS — Z20828 Contact with and (suspected) exposure to other viral communicable diseases: Secondary | ICD-10-CM | POA: Diagnosis present

## 2018-12-22 DIAGNOSIS — Z79899 Other long term (current) drug therapy: Secondary | ICD-10-CM | POA: Diagnosis not present

## 2018-12-22 DIAGNOSIS — D509 Iron deficiency anemia, unspecified: Secondary | ICD-10-CM | POA: Diagnosis present

## 2018-12-22 DIAGNOSIS — Z9981 Dependence on supplemental oxygen: Secondary | ICD-10-CM | POA: Diagnosis not present

## 2018-12-22 DIAGNOSIS — Z923 Personal history of irradiation: Secondary | ICD-10-CM | POA: Diagnosis not present

## 2018-12-22 DIAGNOSIS — F419 Anxiety disorder, unspecified: Secondary | ICD-10-CM | POA: Diagnosis present

## 2018-12-22 DIAGNOSIS — Z9989 Dependence on other enabling machines and devices: Secondary | ICD-10-CM

## 2018-12-22 DIAGNOSIS — D638 Anemia in other chronic diseases classified elsewhere: Secondary | ICD-10-CM | POA: Diagnosis not present

## 2018-12-22 DIAGNOSIS — Z85118 Personal history of other malignant neoplasm of bronchus and lung: Secondary | ICD-10-CM | POA: Diagnosis not present

## 2018-12-22 DIAGNOSIS — Z8701 Personal history of pneumonia (recurrent): Secondary | ICD-10-CM | POA: Diagnosis not present

## 2018-12-22 DIAGNOSIS — G4733 Obstructive sleep apnea (adult) (pediatric): Secondary | ICD-10-CM | POA: Diagnosis present

## 2018-12-22 DIAGNOSIS — Z87891 Personal history of nicotine dependence: Secondary | ICD-10-CM | POA: Diagnosis not present

## 2018-12-22 DIAGNOSIS — Z955 Presence of coronary angioplasty implant and graft: Secondary | ICD-10-CM | POA: Diagnosis not present

## 2018-12-22 DIAGNOSIS — F329 Major depressive disorder, single episode, unspecified: Secondary | ICD-10-CM | POA: Diagnosis present

## 2018-12-22 DIAGNOSIS — R739 Hyperglycemia, unspecified: Secondary | ICD-10-CM | POA: Diagnosis not present

## 2018-12-22 DIAGNOSIS — J9611 Chronic respiratory failure with hypoxia: Secondary | ICD-10-CM | POA: Diagnosis not present

## 2018-12-22 DIAGNOSIS — Z8249 Family history of ischemic heart disease and other diseases of the circulatory system: Secondary | ICD-10-CM | POA: Diagnosis not present

## 2018-12-22 LAB — CBC
HCT: 30 % — ABNORMAL LOW (ref 36.0–46.0)
Hemoglobin: 8.4 g/dL — ABNORMAL LOW (ref 12.0–15.0)
MCH: 25.1 pg — ABNORMAL LOW (ref 26.0–34.0)
MCHC: 28 g/dL — ABNORMAL LOW (ref 30.0–36.0)
MCV: 89.6 fL (ref 80.0–100.0)
Platelets: 224 10*3/uL (ref 150–400)
RBC: 3.35 MIL/uL — ABNORMAL LOW (ref 3.87–5.11)
RDW: 14.7 % (ref 11.5–15.5)
WBC: 8.1 10*3/uL (ref 4.0–10.5)
nRBC: 0 % (ref 0.0–0.2)

## 2018-12-22 LAB — RESPIRATORY PANEL BY PCR

## 2018-12-22 LAB — SARS CORONAVIRUS 2 (TAT 6-24 HRS): SARS Coronavirus 2: NEGATIVE

## 2018-12-22 LAB — BASIC METABOLIC PANEL
Anion gap: 16 — ABNORMAL HIGH (ref 5–15)
BUN: 20 mg/dL (ref 8–23)
CO2: 20 mmol/L — ABNORMAL LOW (ref 22–32)
Calcium: 9.2 mg/dL (ref 8.9–10.3)
Chloride: 105 mmol/L (ref 98–111)
Creatinine, Ser: 1.02 mg/dL — ABNORMAL HIGH (ref 0.44–1.00)
GFR calc Af Amer: >60 mL/min (ref >60)
GFR calc non Af Amer: 55 mL/min — ABNORMAL LOW (ref >60)
Glucose, Bld: 213 mg/dL — ABNORMAL HIGH (ref 70–99)
Potassium: 4.6 mmol/L (ref 3.5–5.1)
Sodium: 141 mmol/L (ref 135–145)

## 2018-12-22 LAB — HEMOGLOBIN A1C
Hgb A1c MFr Bld: 5.4 % (ref 4.8–5.6)
Mean Plasma Glucose: 108.28 mg/dL

## 2018-12-22 LAB — PROCALCITONIN: Procalcitonin: 0.11 ng/mL

## 2018-12-22 MED ORDER — ACETAMINOPHEN 325 MG PO TABS: 650.0000 mg | ORAL_TABLET | Freq: Four times a day (QID) | ORAL | Status: DC | PRN

## 2018-12-22 MED ORDER — DOXYCYCLINE HYCLATE 100 MG PO TABS
100.0000 mg | ORAL_TABLET | Freq: Two times a day (BID) | ORAL | Status: DC
  Administered 2018-12-22 – 2018-12-26 (×9): 100 mg via ORAL
  Filled 2018-12-22 (×9): qty 1

## 2018-12-22 MED ORDER — METOPROLOL SUCCINATE ER 25 MG PO TB24
25.0000 mg | ORAL_TABLET | Freq: Every morning | ORAL | Status: DC
  Administered 2018-12-22 – 2018-12-26 (×5): 25 mg via ORAL
  Filled 2018-12-22 (×5): qty 1

## 2018-12-22 MED ORDER — INSULIN ASPART 100 UNIT/ML ~~LOC~~ SOLN
0.0000 [IU] | Freq: Three times a day (TID) | SUBCUTANEOUS | Status: DC
  Administered 2018-12-23 – 2018-12-24 (×3): 1 [IU] via SUBCUTANEOUS
  Administered 2018-12-25: 3 [IU] via SUBCUTANEOUS
  Administered 2018-12-26: 1 [IU] via SUBCUTANEOUS

## 2018-12-22 MED ORDER — ALBUTEROL SULFATE (2.5 MG/3ML) 0.083% IN NEBU
2.5000 mg | INHALATION_SOLUTION | RESPIRATORY_TRACT | Status: DC | PRN
  Administered 2018-12-22 – 2018-12-25 (×2): 2.5 mg via RESPIRATORY_TRACT
  Filled 2018-12-22 (×2): qty 3

## 2018-12-22 MED ORDER — FERROUS SULFATE 325 (65 FE) MG PO TABS
325.0000 mg | ORAL_TABLET | Freq: Two times a day (BID) | ORAL | Status: DC
  Administered 2018-12-22 – 2018-12-26 (×9): 325 mg via ORAL
  Filled 2018-12-22 (×9): qty 1

## 2018-12-22 MED ORDER — ASPIRIN EC 81 MG PO TBEC
81.0000 mg | DELAYED_RELEASE_TABLET | Freq: Every morning | ORAL | Status: DC
  Administered 2018-12-22 – 2018-12-26 (×5): 81 mg via ORAL
  Filled 2018-12-22 (×5): qty 1

## 2018-12-22 MED ORDER — ENOXAPARIN SODIUM 40 MG/0.4ML ~~LOC~~ SOLN
40.0000 mg | SUBCUTANEOUS | Status: DC
  Administered 2018-12-22 – 2018-12-25 (×5): 40 mg via SUBCUTANEOUS
  Filled 2018-12-22 (×6): qty 0.4

## 2018-12-22 MED ORDER — METHYLPREDNISOLONE SODIUM SUCC 40 MG IJ SOLR
40.0000 mg | Freq: Two times a day (BID) | INTRAMUSCULAR | Status: DC
  Administered 2018-12-22 – 2018-12-24 (×6): 40 mg via INTRAVENOUS
  Filled 2018-12-22 (×6): qty 1

## 2018-12-22 MED ORDER — ACETAMINOPHEN 650 MG RE SUPP: 650.0000 mg | Freq: Four times a day (QID) | RECTAL | Status: DC | PRN

## 2018-12-22 MED ORDER — LORATADINE 10 MG PO TABS
10.0000 mg | ORAL_TABLET | Freq: Every day | ORAL | Status: DC
  Administered 2018-12-22 – 2018-12-26 (×5): 10 mg via ORAL
  Filled 2018-12-22 (×5): qty 1

## 2018-12-22 MED ORDER — LORAZEPAM 2 MG/ML IJ SOLN
0.5000 mg | Freq: Once | INTRAMUSCULAR | Status: AC
  Administered 2018-12-22: 0.5 mg via INTRAVENOUS
  Filled 2018-12-22: qty 1

## 2018-12-22 MED ORDER — GUAIFENESIN ER 600 MG PO TB12
600.0000 mg | ORAL_TABLET | Freq: Two times a day (BID) | ORAL | Status: DC
  Administered 2018-12-22 – 2018-12-26 (×9): 600 mg via ORAL
  Filled 2018-12-22 (×9): qty 1

## 2018-12-22 MED ORDER — CLOPIDOGREL BISULFATE 75 MG PO TABS
75.0000 mg | ORAL_TABLET | Freq: Every morning | ORAL | Status: DC
  Administered 2018-12-22 – 2018-12-26 (×5): 75 mg via ORAL
  Filled 2018-12-22 (×5): qty 1

## 2018-12-22 MED ORDER — AMLODIPINE BESYLATE 5 MG PO TABS
5.0000 mg | ORAL_TABLET | Freq: Every morning | ORAL | Status: DC
  Administered 2018-12-22 – 2018-12-26 (×5): 5 mg via ORAL
  Filled 2018-12-22 (×5): qty 1

## 2018-12-22 MED ORDER — SERTRALINE HCL 100 MG PO TABS
100.0000 mg | ORAL_TABLET | Freq: Two times a day (BID) | ORAL | Status: DC
  Administered 2018-12-22 – 2018-12-26 (×9): 100 mg via ORAL
  Filled 2018-12-22 (×9): qty 1

## 2018-12-22 MED ORDER — IPRATROPIUM-ALBUTEROL 0.5-2.5 (3) MG/3ML IN SOLN
3.0000 mL | Freq: Four times a day (QID) | RESPIRATORY_TRACT | Status: DC
  Administered 2018-12-22 – 2018-12-25 (×15): 3 mL via RESPIRATORY_TRACT
  Filled 2018-12-22 (×15): qty 3

## 2018-12-22 MED ORDER — SODIUM CHLORIDE 0.9 % IV SOLN
1.0000 g | INTRAVENOUS | Status: DC
  Administered 2018-12-22 – 2018-12-24 (×3): 1 g via INTRAVENOUS
  Filled 2018-12-22: qty 1
  Filled 2018-12-22: qty 10
  Filled 2018-12-22 (×2): qty 1

## 2018-12-22 MED ORDER — LOSARTAN POTASSIUM 50 MG PO TABS
50.0000 mg | ORAL_TABLET | Freq: Every morning | ORAL | Status: DC
  Administered 2018-12-22 – 2018-12-26 (×5): 50 mg via ORAL
  Filled 2018-12-22 (×5): qty 1

## 2018-12-22 NOTE — Progress Notes (Signed)
Patient arrived on unit with SOB and distress with oxygen saturation at 86 on 6 liters of Oxygen. Pt was bumped to 10 liters and  [facility] was contacted. Prn Abuterol given, pt breathing improved and pt stated "I feel better".  pt currently resting with O2 sats at 96 on 7 liters. Pt normally on 2-4L at home. Will continue to monitor.   Venesha Petraitis,RN.

## 2018-12-22 NOTE — H&P (Signed)
History and Physical    Suzanne Dickson XKG:[id number] DOB: [birth year] DOA: 12/21/2018  PCP: [name], MD  Patient coming from: Home  I have personally briefly reviewed patient's old medical records in [place]  Chief Complaint: Shortness of breath  HPI: Suzanne Dickson is a 73 y.o. 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  Patient states she was in her usual state of health until about 3 days ago when she began to have URI/allergy type symptoms with nasal congestion.  She developed a cough productive of scant streaky yellow sputum.  Today she began to have flareups with episodes of shortness of breath and wheezing.  Shortness of breath was worse with exertion.  She reports associated diaphoresis without chest pain, subjective fevers, chills, abdominal pain, nausea, vomiting, diarrhea, or dysuria.  She called EMS and on their arrival her home oxygen was increased to 6 L nasal cannula for work of breathing.  She was given IV Solu-Medrol 125 mg, 0.3 IM epinephrine, 2 mg magnesium on route to the ED.  Patient was recently admitted from 11/24/2018-11/25/2018 for COPD exacerbation.  ED Course:  Initial vitals showed BP 133/70, pulse 95, RR 23, temp 98.8 Fahrenheit, SPO2 96% on 6 L supplemental O2 via [place].  Labs notable for WBC 13.7, hemoglobin 8.6, platelets 204,000, sodium 142, potassium 4.2, bicarb 25, BUN 17, creatinine 1.23, serum glucose 153, BNP 127.3.  SARS-CoV-2 test was obtained and pending.  Portable chest x-ray showed postoperative changes in the left midlung with stable scarring and questionable developing patchy opacity in the left  infrahilar lung.  Patient was given 2 DuoNeb treatments and started on IV ceftriaxone and oral doxycycline.  The hospitalist service was consulted to admit for further evaluation and management.  Review of Systems: All systems reviewed and are negative except as documented in history of present illness above.   Past Medical History:  Diagnosis Date  . Adenocarcinoma of left lung, stage 1 ([facility]) 04/15/2016   "I'm in remission" (04/05/2017)  . Anxiety   . Arthritis    "legs, elbows" (04/05/2017)  . Bronchitis, chronic ([place])    "haven't had it in awhile" (04/05/2017)  . CHF (congestive heart failure) ([place])   . COPD (chronic obstructive pulmonary disease) ([place])   . Daily headache    "recently" (04/05/2017)  . Depression   . Dyspnea    with exertion  . GERD (gastroesophageal reflux disease)   . History of blood transfusion 1950s  . History of radiation therapy 05/04/16 - 05/12/16   Left lung treated to 54 Gy with 3 fx of 18 Gy  . Hypertension   . On home oxygen therapy    "2L; 24/7" (04/05/2017)  . OSA on CPAP   . Pneumonia 10/2015   "at least 3 times" (04/05/2017)    Past Surgical History:  Procedure Laterality Date  . COLONOSCOPY    . CORONARY ANGIOPLASTY WITH STENT PLACEMENT  04/05/2017  . CORONARY STENT INTERVENTION N/A 04/05/2017   Procedure: CORONARY STENT INTERVENTION;  Surgeon: [name], MD;  Location: [facility];  Service: Cardiovascular;  Laterality: N/A;  . DILATION AND CURETTAGE OF UTERUS    . FUDUCIAL PLACEMENT Left 03/31/2016  Procedure: PLACEMENT OF FUDUCIAL LEFT UPPER LOBE;  Surgeon: Collene Gobble, MD;  Location: [facility];  Service: Thoracic;  Laterality: Left;  . LAPAROSCOPIC CHOLECYSTECTOMY    . LEFT HEART CATH AND CORONARY ANGIOGRAPHY N/A 02/17/2017   Procedure: LEFT HEART CATH AND CORONARY ANGIOGRAPHY;  Surgeon: [name], MD;  Location: [facility];  Service: Cardiovascular;  Laterality: N/A;  . SINUSOTOMY    . TONSILLECTOMY    . TUBAL  LIGATION    . VIDEO BRONCHOSCOPY WITH ENDOBRONCHIAL NAVIGATION N/A 03/31/2016   Procedure: VIDEO BRONCHOSCOPY WITH ENDOBRONCHIAL NAVIGATION;  Surgeon: Collene Gobble, MD;  Location: [facility];  Service: Thoracic;  Laterality: N/A;    Social History:  reports that she quit smoking about 4 years ago. Her smoking use included cigarettes. She has a 40.00 pack-year smoking history. She has never used smokeless tobacco. She reports that she does not drink alcohol or use drugs.  No Known Allergies  Family History  Problem Relation Age of Onset  . Heart attack Mother   . Heart attack Father      Prior to Admission medications   Medication Sig Start Date End Date Taking? Authorizing Provider  albuterol (PROVENTIL) (2.5 MG/3ML) 0.083% nebulizer solution Take 3 mLs (2.5 mg total) by nebulization every 4 (four) hours as needed. For shortness of breath Patient taking differently: Take 2.5 mg by nebulization every 4 (four) hours as needed for wheezing or shortness of breath.  04/16/16   Tanda Rockers, MD  albuterol (VENTOLIN HFA) 108 (90 Base) MCG/ACT inhaler Inhale 2 puffs into the lungs every 4 (four) hours as needed for wheezing or shortness of breath. 12/11/18   Tanda Rockers, MD  amLODipine (NORVASC) 5 MG tablet Take 1 tablet (5 mg total) by mouth daily. Patient taking differently: Take 5 mg by mouth every morning.  10/18/16   Tanda Rockers, MD  aspirin EC 81 MG tablet Take 81 mg by mouth every morning.    [provider]  cetirizine (ZYRTEC) 10 MG tablet Take 10 mg by mouth at bedtime.    [provider]  clopidogrel (PLAVIX) 75 MG tablet Take 75 mg by mouth every morning.     [provider]  ferrous sulfate 325 (65 FE) MG EC tablet Take 325 mg by mouth 2 (two) times daily with a meal.     [provider]  Fluticasone-Umeclidin-Vilant (TRELEGY ELLIPTA) 100-62.5-25 MCG/INH AEPB Inhale 1 puff into the lungs daily. Patient taking differently: Inhale 1 puff into  the lungs every morning.  02/17/18   Tanda Rockers, MD  GUAIFENESIN ER PO Take 1 tablet by mouth 2 (two) times daily.     [provider]  ibuprofen (ADVIL) 200 MG tablet Take 400 mg by mouth every 6 (six) hours as needed (pain).    [provider]  losartan (COZAAR) 50 MG tablet Take 50 mg by mouth every morning. 11/15/18   [provider]  metoprolol succinate (TOPROL-XL) 25 MG 24 hr tablet Take 25 mg by mouth every morning.     [provider]  Multiple Vitamin (MULTIVITAMIN WITH MINERALS) TABS tablet Take 1 tablet by mouth every morning.    [provider]  nitroGLYCERIN (NITRODUR - DOSED IN MG/24 HR) 0.4 mg/hr patch Place 0.4 mg onto the skin every morning.  08/10/17   [provider]  nitroGLYCERIN (NITROSTAT) 0.4 MG SL tablet Place 0.4 mg under the tongue every 5 (five) minutes as needed for chest pain.  [provider]  OVER THE COUNTER MEDICATION Take 1 tablet by mouth at bedtime. OTC sleep aid - unknown ingredients    [provider]  OXYGEN Inhale 2-4 L/min into the lungs See admin instructions. 2-3 lpm at rest and 4 lpm with exertion    [provider]  pantoprazole (PROTONIX) 40 MG tablet Take 30-60 min before first meal of the day Patient taking differently: Take 40 mg by mouth daily before breakfast.  08/08/18   Tanda Rockers, MD  Polyvinyl Alcohol-Povidone (REFRESH OP) Place 1 drop into both eyes daily as needed (dry eyes).    [provider]  sertraline (ZOLOFT) 100 MG tablet Take 100 mg by mouth 2 (two) times daily. 05/18/16   [provider]    Physical Exam: Vitals:   12/21/18 2334 12/21/18 2336 12/21/18 2337 12/22/18 0000  BP:    136/65  Pulse:    (!) 105  Resp:    (!) 24  Temp:      TempSrc:      SpO2: 93% 96% 98% 100%  Weight:      Height:        Constitutional: Resting in bed with head elevated, somewhat anxious Eyes: PERRL, lids and conjunctivae normal ENMT:  Mucous membranes are moist. Posterior pharynx clear of any exudate or lesions.Normal dentition.  Neck: normal, supple, no masses. Respiratory: Distant breath sounds with end expiratory wheezing.  Slightly increased respiratory effort. No accessory muscle use.  Cardiovascular: Tachycardic, no murmurs / rubs / gallops.  +1 pitting edema to RLE, trace edema LLE. Abdomen: no tenderness, no masses palpated. No hepatosplenomegaly. Bowel sounds positive.  Musculoskeletal: no clubbing / cyanosis. No joint deformity upper and lower extremities. Good ROM, no contractures. Normal muscle tone.  Skin: no rashes, lesions, ulcers. No induration Neurologic: CN 2-12 grossly intact. Sensation intact, Strength 5/5 in all 4.  Psychiatric: Normal judgment and insight. Alert and oriented x 3. Normal mood.     Labs on Admission: I have personally reviewed following labs and imaging studies  CBC: Recent Labs  Lab 12/21/18 2200  WBC 13.7*  NEUTROABS 9.3*  HGB 8.6*  HCT 30.4*  MCV 92.1  PLT 078   Basic Metabolic Panel: Recent Labs  Lab 12/21/18 2200  NA 142  K 4.2  CL 107  CO2 25  GLUCOSE 153*  BUN 17  CREATININE 1.23*  CALCIUM 9.2   GFR: Estimated Creatinine Clearance: 38.5 mL/min (A) (by C-G formula based on SCr of 1.23 mg/dL (H)). Liver Function Tests: Recent Labs  Lab 12/21/18 2200  AST 28  ALT 22  ALKPHOS 59  BILITOT 0.7  PROT 6.9  ALBUMIN 3.8   No results for input(s): LIPASE, AMYLASE in the last 168 hours. No results for input(s): AMMONIA in the last 168 hours. Coagulation Profile: No results for input(s): INR, PROTIME in the last 168 hours. Cardiac Enzymes: No results for input(s): CKTOTAL, CKMB, CKMBINDEX, TROPONINI in the last 168 hours. BNP (last 3 results) No results for input(s): PROBNP in the last 8760 hours. HbA1C: No results for input(s): HGBA1C in the last 72 hours. CBG: No results for input(s): GLUCAP in the last 168 hours. Lipid Profile: No results for  input(s): CHOL, HDL, LDLCALC, TRIG, CHOLHDL, LDLDIRECT in the last 72 hours. Thyroid Function Tests: No results for input(s): TSH, T4TOTAL, FREET4, T3FREE, THYROIDAB in the last 72 hours. Anemia Panel: No results for input(s): VITAMINB12, FOLATE, FERRITIN, TIBC, IRON, RETICCTPCT in the last 72 hours. Urine analysis:  Component Value Date/Time   COLORURINE YELLOW 09/02/2014 2138   APPEARANCEUR CLEAR 09/02/2014 2138   LABSPEC 1.019 09/02/2014 2138   PHURINE 5.0 09/02/2014 2138   GLUCOSEU NEGATIVE 09/02/2014 2138   HGBUR NEGATIVE 09/02/2014 2138   BILIRUBINUR NEGATIVE 09/02/2014 2138   KETONESUR NEGATIVE 09/02/2014 2138   PROTEINUR NEGATIVE 09/02/2014 2138   UROBILINOGEN 0.2 09/02/2014 2138   NITRITE NEGATIVE 09/02/2014 2138   LEUKOCYTESUR NEGATIVE 09/02/2014 2138    Radiological Exams on Admission: Dg Chest Portable 1 View  Result Date: 12/21/2018 CLINICAL DATA:  Shortness of breath EXAM: PORTABLE CHEST 1 VIEW COMPARISON:  Radiograph and CT 11/24/2018 FINDINGS: Postsurgical change in the left mid lung with stable bandlike region of scarring. Question some developing patchy opacity in the left infrahilar lung slightly increased from comparison study. No pneumothorax. No visible effusion. The aorta is calcified. The remaining cardiomediastinal contours are unremarkable. No acute osseous or soft tissue abnormality. IMPRESSION: 1. Question developing patchy opacity in the left infrahilar lung. Could reflect atelectasis or developing infection. 2. Stable postoperative changes in the left mid lung with stable bandlike region of scarring. 3.  Aortic Atherosclerosis (ICD10-I70.0). Electronically Signed   By: Lovena Le M.D.   On: 12/21/2018 22:17    EKG: Independently reviewed. Sinus rhythm, QTC 407.  When compared to prior QTC is longer.  Assessment/Plan Principal Problem:   Acute on chronic respiratory failure with hypoxia (HCC) Active Problems:   Essential hypertension   Chronic  respiratory failure with hypoxia (HCC)   COPD exacerbation (HCC)   OSA on CPAP   CAD (coronary artery disease)   Anemia of chronic disease  Suzanne Dickson is a 73 y.o. 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  Acute on chronic respiratory failure with hypoxia due to COPD exacerbation: Clinical presentation consistent with COPD exacerbation.  She is requiring 6 L of O2 via [place] on admission, normally wears 2-4 L.  She does have a leukocytosis and there is a questionable developing opacity in the left lung on portable chest x-ray. -Continue scheduled duo nebs, as needed albuterol nebulizers -Continue IV Solu-Medrol 40 mg twice daily -Continue both oral doxycycline and IV ceftriaxone for now -Obtain 2 view chest x-ray to better assess left-sided opacity -SARS-CoV-2 PCR test pending  Asymmetric lower extremity edema: Right greater than left.  Will obtain right lower extremity venous Doppler to assess for DVT.  CAD s/p DES: Denies any chest pain.  Continue Toprol-XL, aspirin, and Plavix.  Does not appear to be on statin therapy.  Hypertension: Resume home amlodipine, losartan, Toprol-XL.  Anemia of chronic disease and iron deficiency: Chronic and stable without obvious bleeding.  Continue to monitor.  Depression with anxiety: Continue sertraline.  OSA: Uses CPAP intermittently at home.  Continue CPAP at night.  DVT prophylaxis: Lovenox Code Status: Full code, confirmed with Family Communication: Discussed with patient, she has updated her husband Disposition Plan: Likely discharge to home pending clinical progress Consults called: None  Admission status: Observation   Zada Finders MD [facility]  If 7PM-7AM, please contact night-coverage [email or web address]  12/22/2018, 1:10 AM

## 2018-12-22 NOTE — Progress Notes (Signed)
Pt given breathing treatment and placed on BIPAP due to noticeable distress (accessory muscle use, increased RR, increase HR, audiable wheezes).  Pt is tolerating BiPAP well and nods when asked if she feels better. RT will continue to monitor patient.

## 2018-12-22 NOTE — Progress Notes (Signed)
New Admission Note: ? Arrival Method: Stretcher Mental Orientation: Alert and Oriented X4 Telemetry: None Assessment: Completed Skin: Refer to flowsheet IV: Left Antecubital han Pain: None Tubes: None Safety Measures: Safety Fall Prevention Plan discussed with patient. Admission: Completed 5 [place] Orientation: Patient has been orientated to the room, unit and the staff. Family: None  Orders have been reviewed and are being implemented. Will continue to monitor the patient. Call light has been placed within reach and bed alarm has been activated.  ? Milagros Loll, RN  Phone Number: [phone]

## 2018-12-22 NOTE — Significant Event (Addendum)
Rapid Response Event Note  Overview: Called d/t respiratory distress. Pt here with COPD exacerbation. Pt received several breathing treatments in the ED and was on 6L [place]. On arrival to 5M21, pt breathing became labored and FiO2 had to be increased to 10L HFNC. Time Called: 0555 Arrival Time: 0600 Event Type: Respiratory  Initial Focused Assessment: Pt sitting up in bed in respiratory distress, +WOB, +accessory muscle use. Pt denies chest pain, says she just needs to catch her breath. HR-115, BP-128/86, RR-40s, SpO2-97% on 10L HFNC. Lungs diminished with wheezes t/o.  Interventions: FiO2 increased to 10L HFNC. Albuterol tx-Pt looks more comfortable after tx, says she feels better (HR-98, RR-25, SpO2-95% on 10L HFNC)  Plan of Care (if not transferred): Continue to monitor pt respiratory status closely. Attempt to wean FiO2 down as patient tolerates. Call RRT if further assistance needed. Event Summary:   at      at          [facility], Carren Rang

## 2018-12-22 NOTE — Progress Notes (Signed)
Right lower extremity venous doppler has been completed, results can be found under CV proc.   Darlina Sicilian RDCS

## 2018-12-22 NOTE — Progress Notes (Signed)
PROGRESS NOTE  Suzanne Dickson MGQ:[id number] DOB: 12-18-1945 DOA: 12/21/2018 PCP: [name], MD  HPI/Recap of past 24 hours: HPI from Dr Suzanne Dickson is a 73 y.o. 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 Patient states she was in her usual state of health until about 3 days ago when she began to have URI/allergy type symptoms with nasal congestion.  She developed a cough productive of scant streaky yellow sputum. Pt called EMS and on their arrival her home oxygen was increased to 6 L nasal cannula for work of breathing. Patient was recently admitted from 11/24/2018-11/25/2018 for COPD exacerbation. In the ED, SPO2 96% on 6 L supplemental O2 via [place]. Labs notable for WBC 13, SARS-CoV-2 test was negative. Chest x-ray showed postoperative changes in the left midlung with stable scarring and questionable developing patchy opacity in the left infrahilar lung. Pt admitted for further management.    Today, pt still c/o SOB with wheezing, had another episode of panic attack today, denies any chest pain, fever/chills.  Assessment/Plan: Principal Problem:   Acute on chronic respiratory failure with hypoxia (HCC) Active Problems:   Essential hypertension   Chronic respiratory failure with hypoxia (HCC)   COPD exacerbation (HCC)   OSA on CPAP   CAD (coronary artery disease)   Anemia of chronic disease   Acute on chronic respiratory failure with hypoxia due to COPD exacerbation Ongoing, continues to require about 6 L of O2 (home O2 2 to 4 L) Currently afebrile, with no leukocytosis Covid screening test negative Respiratory virus panel negative  Procalcitonin elevated Urine strep pneumo, Legionella pending CXR with questionable developing opacity in the left lung Continue scheduled duo nebs, as needed albuterol nebulizer, IV Solu-Medrol 40 mg twice daily Continue both oral doxycycline and IV ceftriaxone Monitor closely  Hyperglycemia Likely 2/2 steroid use SSI, Accu-Cheks for now A1c pending  Asymmetric lower extremity edema Right greater than left Bilateral lower extremity venous Doppler negative for DVT  CAD s/p DES Currently chest pain-free Continue Toprol-XL, aspirin, and Plavix, may consider holding beta-blocker due to COPD exacerbation Not on home statins  Hypertension Continue home amlodipine, losartan, Toprol-XL  Anemia of chronic disease and iron deficiency Hemoglobin stable at baseline Daily CBC  Depression with anxiety Continue sertraline  OSA Uses CPAP intermittently at home Continue CPAP at night.        Malnutrition Type:      Malnutrition Characteristics:      Nutrition Interventions:       Estimated body mass index is 27.63 kg/m as calculated from the following:   Height as of this encounter: 5\' 3"  (1.6 m).   Weight as of this encounter: 70.8 kg.     Code Status: Full  Family Communication: Husband at bedside  Disposition Plan: To be determined   Consultants:  None  Procedures:  None  Antimicrobials:  Ceftriaxone  Doxycycline  DVT prophylaxis: Lovenox   Objective: Vitals:   12/22/18 0616 12/22/18 0635 12/22/18 0915 12/22/18 0925  BP: 128/86   126/78  Pulse: (!) 102   98  Resp: (!) 22     Temp: 98.4 F (36.9 C)   98.6 F (37 C)  TempSrc: Oral  Oral  SpO2: 97% 96% 97% 98%  Weight:      Height:        Intake/Output Summary (Last 24 hours) at 12/22/2018 1650 Last data filed at 12/22/2018 0900 Gross per 24 hour  Intake 240 ml  Output 0 ml  Net 240 ml   Filed Weights   12/21/18 2159  Weight: 70.8 kg    Exam:  General: NAD,  acutely ill-appearing  Cardiovascular: S1, S2 present  Respiratory:  Diminished breath sounds with bilateral wheezing noted  Abdomen: Soft, nontender, nondistended, bowel sounds present  Musculoskeletal: +1 edema to RLE, trace edema to LLE  Skin: Normal  Psychiatry: Normal mood   Data Reviewed: CBC: Recent Labs  Lab 12/21/18 2200 12/22/18 0621  WBC 13.7* 8.1  NEUTROABS 9.3*  --   HGB 8.6* 8.4*  HCT 30.4* 30.0*  MCV 92.1 89.6  PLT 284 109   Basic Metabolic Panel: Recent Labs  Lab 12/21/18 2200 12/22/18 0621  NA 142 141  K 4.2 4.6  CL 107 105  CO2 25 20*  GLUCOSE 153* 213*  BUN 17 20  CREATININE 1.23* 1.02*  CALCIUM 9.2 9.2   GFR: Estimated Creatinine Clearance: 46.4 mL/min (A) (by C-G formula based on SCr of 1.02 mg/dL (H)). Liver Function Tests: Recent Labs  Lab 12/21/18 2200  AST 28  ALT 22  ALKPHOS 59  BILITOT 0.7  PROT 6.9  ALBUMIN 3.8   No results for input(s): LIPASE, AMYLASE in the last 168 hours. No results for input(s): AMMONIA in the last 168 hours. Coagulation Profile: No results for input(s): INR, PROTIME in the last 168 hours. Cardiac Enzymes: No results for input(s): CKTOTAL, CKMB, CKMBINDEX, TROPONINI in the last 168 hours. BNP (last 3 results) No results for input(s): PROBNP in the last 8760 hours. HbA1C: No results for input(s): HGBA1C in the last 72 hours. CBG: No results for input(s): GLUCAP in the last 168 hours. Lipid Profile: No results for input(s): CHOL, HDL, LDLCALC, TRIG, CHOLHDL, LDLDIRECT in the last 72 hours. Thyroid Function Tests: No results for input(s): TSH, T4TOTAL, FREET4, T3FREE, THYROIDAB in the last 72 hours. Anemia Panel: No results for input(s): VITAMINB12, FOLATE, FERRITIN, TIBC, IRON, RETICCTPCT in the last 72 hours. Urine analysis:    Component Value Date/Time   COLORURINE YELLOW 09/02/2014 2138   APPEARANCEUR CLEAR 09/02/2014 2138   LABSPEC 1.019 09/02/2014 2138   PHURINE 5.0 09/02/2014 2138    GLUCOSEU NEGATIVE 09/02/2014 2138   HGBUR NEGATIVE 09/02/2014 2138   BILIRUBINUR NEGATIVE 09/02/2014 2138   KETONESUR NEGATIVE 09/02/2014 2138   PROTEINUR NEGATIVE 09/02/2014 2138   UROBILINOGEN 0.2 09/02/2014 2138   NITRITE NEGATIVE 09/02/2014 2138   LEUKOCYTESUR NEGATIVE 09/02/2014 2138   Sepsis Labs: @LABRCNTIP (procalcitonin:4,lacticidven:4)  ) Recent Results (from the past 240 hour(s))  SARS CORONAVIRUS 2 (TAT 6-24 HRS) Nasopharyngeal Nasopharyngeal Swab     Status: None   Collection Time: 12/21/18 10:09 PM   Specimen: Nasopharyngeal Swab  Result Value Ref Range Status   SARS Coronavirus 2 NEGATIVE NEGATIVE Final    Comment: (NOTE) SARS-CoV-2 target nucleic acids are NOT DETECTED. The SARS-CoV-2 RNA is generally detectable in upper and lower respiratory specimens during the acute phase of infection. Negative results do not preclude SARS-CoV-2 infection, do not rule out co-infections with other pathogens, and should not be used as the sole basis for treatment or other patient management decisions. Negative results must be combined with clinical observations, patient history, and epidemiological information. The expected result is Negative. Fact Sheet  for Patients: SugarRoll.be Fact Sheet for Healthcare Providers: https://www.woods-mathews.com/ This test is not yet approved or cleared by the [place] FDA and  has been authorized for detection and/or diagnosis of SARS-CoV-2 by FDA under an Emergency Use Authorization (EUA). This EUA will remain  in effect (meaning this test can be used) for the duration of the COVID-19 declaration under Section 56 4(b)(1) of the Act, 21 U.S.C. section 360bbb-3(b)(1), unless the authorization is terminated or revoked sooner. Performed at [facility], [place] [address], [place], [place] [address]   Respiratory Panel by PCR     Status: None   Collection Time: 12/22/18  8:42 AM   Specimen:  Nasopharyngeal Swab; Respiratory  Result Value Ref Range Status   Adenovirus NOT DETECTED NOT DETECTED Final   Coronavirus 229E NOT DETECTED NOT DETECTED Final    Comment: (NOTE) The Coronavirus on the Respiratory Panel, DOES NOT test for the novel  Coronavirus (2019 nCoV)    Coronavirus HKU1 NOT DETECTED NOT DETECTED Final   Coronavirus NL63 NOT DETECTED NOT DETECTED Final   Coronavirus OC43 NOT DETECTED NOT DETECTED Final   Metapneumovirus NOT DETECTED NOT DETECTED Final   Rhinovirus / Enterovirus NOT DETECTED NOT DETECTED Final   Influenza A NOT DETECTED NOT DETECTED Final   Influenza B NOT DETECTED NOT DETECTED Final   Parainfluenza Virus 1 NOT DETECTED NOT DETECTED Final   Parainfluenza Virus 2 NOT DETECTED NOT DETECTED Final   Parainfluenza Virus 3 NOT DETECTED NOT DETECTED Final   Parainfluenza Virus 4 NOT DETECTED NOT DETECTED Final   Respiratory Syncytial Virus NOT DETECTED NOT DETECTED Final   Bordetella pertussis NOT DETECTED NOT DETECTED Final   Chlamydophila pneumoniae NOT DETECTED NOT DETECTED Final   Mycoplasma pneumoniae NOT DETECTED NOT DETECTED Final    Comment: Performed at [facility], [place]. [address] [place],  [address]      Studies: X-ray Chest Pa And Lateral  Result Date: 12/22/2018 CLINICAL DATA:  Shortness of breath EXAM: CHEST - 2 VIEW COMPARISON:  Radiograph 12/21/2018, CT 11/24/2018 FINDINGS: Stable bandlike region of scarring and postsurgical change in the left upper lobe. Persistent left basilar opacity favoring atelectasis or some mild edema. Cardiomediastinal contours are stable from prior with a calcified aorta. No pneumothorax. No visible effusion. Osseous structures are unremarkable. Cardiac monitoring leads overlie the chest. IMPRESSION: 1. Stable bandlike region of scarring and postsurgical change in the left upper lobe. 2. Persistent left basilar opacity favoring atelectasis or mild asymmetric edema. Electronically Signed   By: Lovena Le M.D.   On: 12/22/2018 05:26   Dg Chest Portable 1 View  Result Date: 12/21/2018 CLINICAL DATA:  Shortness of breath EXAM: PORTABLE CHEST 1 VIEW COMPARISON:  Radiograph and CT 11/24/2018 FINDINGS: Postsurgical change in the left mid lung with stable bandlike region of scarring. Question some developing patchy opacity in the left infrahilar lung slightly increased from comparison study. No pneumothorax. No visible effusion. The aorta is calcified. The remaining cardiomediastinal contours are unremarkable. No acute osseous or soft tissue abnormality. IMPRESSION: 1. Question developing patchy opacity in the left infrahilar lung. Could reflect atelectasis or developing infection. 2. Stable postoperative changes in the left mid lung with stable bandlike region of scarring. 3.  Aortic Atherosclerosis (ICD10-I70.0). Electronically Signed   By: Lovena Le M.D.   On: 12/21/2018 22:17   Vas [place] Lower Extremity Venous (dvt)  Result Date: 12/22/2018  Lower Venous Study Indications: Edema.  Anticoagulation: Lovenox. Performing Technologist: Darlina Sicilian RDCS  Examination Guidelines: A complete evaluation includes B-mode imaging, spectral Doppler, color Doppler, and power Doppler as needed of all accessible portions of each vessel. Bilateral testing is considered an integral part of a complete examination. Limited examinations for reoccurring indications may be performed as noted.  +---------+---------------+---------+-----------+----------+--------------+ RIGHT    CompressibilityPhasicitySpontaneityPropertiesThrombus Aging +---------+---------------+---------+-----------+----------+--------------+ CFV      Full           Yes      Yes                                 +---------+---------------+---------+-----------+----------+--------------+ FV Prox  Full                                                        +---------+---------------+---------+-----------+----------+--------------+ FV Mid    Full                                                        +---------+---------------+---------+-----------+----------+--------------+ FV DistalFull                                                        +---------+---------------+---------+-----------+----------+--------------+ POP      Full           Yes      Yes                                 +---------+---------------+---------+-----------+----------+--------------+ PTV      Full                                                        +---------+---------------+---------+-----------+----------+--------------+ PERO     Full                                                        +---------+---------------+---------+-----------+----------+--------------+   +----+---------------+---------+-----------+----------+--------------+ LEFTCompressibilityPhasicitySpontaneityPropertiesThrombus Aging +----+---------------+---------+-----------+----------+--------------+ CFV Full           Yes      Yes                                 +----+---------------+---------+-----------+----------+--------------+     Summary: Right: No evidence of deep vein thrombosis in the lower extremity. No indirect evidence of obstruction proximal to the inguinal ligament. No cystic structure found in the popliteal fossa. Left: No evidence of common femoral vein obstruction.  *See table(s) above for measurements and observations.    Preliminary     Scheduled Meds: . amLODipine  5 mg Oral q morning -  10a  . aspirin EC  81 mg Oral q morning - 10a  . clopidogrel  75 mg Oral q morning - 10a  . doxycycline  100 mg Oral Q12H  . enoxaparin (LOVENOX) injection  40 mg Subcutaneous Q24H  . ferrous sulfate  325 mg Oral BID WC  . guaiFENesin  600 mg Oral BID  . ipratropium-albuterol  3 mL Nebulization Q6H  . loratadine  10 mg Oral Daily  . losartan  50 mg Oral q morning - 10a  . methylPREDNISolone (SOLU-MEDROL) injection  40 mg Intravenous Q12H  .  metoprolol succinate  25 mg Oral q morning - 10a  . sertraline  100 mg Oral BID    Continuous Infusions: . cefTRIAXone (ROCEPHIN)  IV 1 g (12/22/18 1219)     LOS: 0 days     Alma Friendly, MD [facility]  If 7PM-7AM, please contact night-coverage [email or web address] 12/22/2018, 4:50 PM

## 2018-12-23 DIAGNOSIS — D638 Anemia in other chronic diseases classified elsewhere: Secondary | ICD-10-CM

## 2018-12-23 DIAGNOSIS — G4733 Obstructive sleep apnea (adult) (pediatric): Secondary | ICD-10-CM

## 2018-12-23 DIAGNOSIS — I10 Essential (primary) hypertension: Secondary | ICD-10-CM

## 2018-12-23 DIAGNOSIS — Z9989 Dependence on other enabling machines and devices: Secondary | ICD-10-CM

## 2018-12-23 DIAGNOSIS — J441 Chronic obstructive pulmonary disease with (acute) exacerbation: Principal | ICD-10-CM

## 2018-12-23 LAB — GLUCOSE, CAPILLARY
Glucose-Capillary: 141 mg/dL — ABNORMAL HIGH (ref 70–99)
Glucose-Capillary: 123 mg/dL — ABNORMAL HIGH (ref 70–99)
Glucose-Capillary: 107 mg/dL — ABNORMAL HIGH (ref 70–99)
Glucose-Capillary: 87 mg/dL (ref 70–99)

## 2018-12-23 LAB — CBC WITH DIFFERENTIAL/PLATELET
Abs Immature Granulocytes: 0.13 10*3/uL — ABNORMAL HIGH (ref 0.00–0.07)
Basophils Absolute: 0 10*3/uL (ref 0.0–0.1)
Basophils Relative: 0 %
Eosinophils Absolute: 0 10*3/uL (ref 0.0–0.5)
Eosinophils Relative: 0 %
HCT: 27.7 % — ABNORMAL LOW (ref 36.0–46.0)
Hemoglobin: 8 g/dL — ABNORMAL LOW (ref 12.0–15.0)
Immature Granulocytes: 1 %
Lymphocytes Relative: 7 %
Lymphs Abs: 0.8 10*3/uL (ref 0.7–4.0)
MCH: 26 pg (ref 26.0–34.0)
MCHC: 28.9 g/dL — ABNORMAL LOW (ref 30.0–36.0)
MCV: 89.9 fL (ref 80.0–100.0)
Monocytes Absolute: 0.4 10*3/uL (ref 0.1–1.0)
Monocytes Relative: 4 %
Neutro Abs: 10.6 10*3/uL — ABNORMAL HIGH (ref 1.7–7.7)
Neutrophils Relative %: 88 %
Platelets: 225 10*3/uL (ref 150–400)
RBC: 3.08 MIL/uL — ABNORMAL LOW (ref 3.87–5.11)
RDW: 14.6 % (ref 11.5–15.5)
WBC: 11.9 10*3/uL — ABNORMAL HIGH (ref 4.0–10.5)
nRBC: 0 % (ref 0.0–0.2)

## 2018-12-23 LAB — BASIC METABOLIC PANEL
Anion gap: 8 (ref 5–15)
BUN: 25 mg/dL — ABNORMAL HIGH (ref 8–23)
CO2: 26 mmol/L (ref 22–32)
Calcium: 9.4 mg/dL (ref 8.9–10.3)
Chloride: 108 mmol/L (ref 98–111)
Creatinine, Ser: 0.98 mg/dL (ref 0.44–1.00)
GFR calc Af Amer: >60 mL/min (ref >60)
GFR calc non Af Amer: 57 mL/min — ABNORMAL LOW (ref >60)
Glucose, Bld: 149 mg/dL — ABNORMAL HIGH (ref 70–99)
Potassium: 4.4 mmol/L (ref 3.5–5.1)
Sodium: 142 mmol/L (ref 135–145)

## 2018-12-23 NOTE — Evaluation (Signed)
Physical Therapy Evaluation Patient Details Name: Suzanne Dickson MRN: [id number] DOB: 1945-10-19 Today's Date: 12/23/2018   History of Present Illness  pt is a 73 y/o female with pmh significant for COPD with chronic respiratory failure on 2-4 L O2 at home, CAD, HTN, h/o adenocarcinoma on the left lung, OSA, presenting to the ED for evaluation of shortness of breath.  Clinical Impression  Pt admitted with/for SOB.  Pt is generally S level, but is significantly limited in mobility due to dyspnea and low SpO2.Marland Kitchen  Pt currently limited functionally due to the problems listed below.  (see problems list.)  Pt will benefit from PT to maximize function and safety to be able to get home safely with available assist.     Follow Up Recommendations Home health PT;Supervision/Assistance - 24 hour;Supervision - Intermittent    Equipment Recommendations  None recommended by PT    Recommendations for Other Services       Precautions / Restrictions Precautions Precautions: Fall      Mobility  Bed Mobility               General bed mobility comments: OOB on arrival  Transfers Overall transfer level: Needs assistance Equipment used: Rolling walker (2 wheeled) Transfers: Sit to/from Stand Sit to Stand: Supervision            Ambulation/Gait Ambulation/Gait assistance: Supervision Gait Distance (Feet): 60 Feet Assistive device: Rolling walker (2 wheeled) Gait Pattern/deviations: Step-through pattern Gait velocity: slow Gait velocity interpretation: <1.8 ft/sec, indicate of risk for recurrent falls General Gait Details: generally steady, but pt had to stop to rest due to SpO2 dropped to low 80's on 6L HFNC  Standing and breathing efficiently brought sats to 89%, but dropped again to 86% with ambulation back to the room.  Stairs            Wheelchair Mobility    Modified Rankin (Stroke Patients Only)       Balance Overall balance assessment: Mild deficits observed,  not formally tested                                           Pertinent Vitals/Pain Pain Assessment: No/denies pain    Home Living Family/patient expects to be discharged to:: Private residence Living Arrangements: Spouse/significant other Available Help at Discharge: Family;Available PRN/intermittently Type of Home: House Home Access: Stairs to enter Entrance Stairs-Rails: None Entrance Stairs-Number of Steps: 2 Home Layout: Two level;Able to live on main level with bedroom/bathroom Home Equipment: Walker - 4 wheels;Shower seat;Hand held shower head;Toilet riser      Prior Function Level of Independence: Independent with assistive device(s)               Hand Dominance   Dominant Hand: Right    Extremity/Trunk Assessment   Upper Extremity Assessment Upper Extremity Assessment: Overall WFL for tasks assessed    Lower Extremity Assessment Lower Extremity Assessment: Generalized weakness       Communication   Communication: No difficulties  Cognition Arousal/Alertness: Awake/alert Behavior During Therapy: WFL for tasks assessed/performed Overall Cognitive Status: Within Functional Limits for tasks assessed                                        General Comments General comments (skin integrity, edema, etc.): VSS  except SpO2 dropping into the lower 80's and taking efficient breathing (pursed lip) to get toward 90%    Exercises     Assessment/Plan    PT Assessment Patient needs continued PT services  PT Problem List Decreased strength;Decreased activity tolerance;Decreased balance;Decreased mobility;Decreased knowledge of use of DME;Cardiopulmonary status limiting activity       PT Treatment Interventions DME instruction;Gait training;Functional mobility training;Therapeutic activities;Stair training;Patient/family education    PT Goals (Current goals can be found in the Care Plan section)  Acute Rehab PT Goals Patient  Stated Goal: get my breath back, it's been too long PT Goal Formulation: With patient Time For Goal Achievement: 01/06/19 Potential to Achieve Goals: Good    Frequency Min 3X/week   Barriers to discharge        Co-evaluation               AM-PAC PT "6 Clicks" Mobility  Outcome Measure Help needed turning from your back to your side while in a flat bed without using bedrails?: None Help needed moving from lying on your back to sitting on the side of a flat bed without using bedrails?: None Help needed moving to and from a bed to a chair (including a wheelchair)?: None Help needed standing up from a chair using your arms (e.g., wheelchair or bedside chair)?: None Help needed to walk in hospital room?: None Help needed climbing 3-5 steps with a railing? : A Lot 6 Click Score: 22    End of Session   Activity Tolerance: Patient tolerated treatment well;Patient limited by fatigue Patient left: in chair;with call bell/phone within reach Nurse Communication: Mobility status PT Visit Diagnosis: Other abnormalities of gait and mobility (R26.89);Difficulty in walking, not elsewhere classified (R26.2)    Time: 1740-1800 PT Time Calculation (min) (ACUTE ONLY): 20 min   Charges:   PT Evaluation $PT Eval Moderate Complexity: 1 Mod          12/23/2018  Donnella Sham, PT Acute Rehabilitation Services [phone]  (pager) [phone]  (office)  Tessie Fass Yeriel Mineo 12/23/2018, 6:12 PM

## 2018-12-23 NOTE — Progress Notes (Signed)
PROGRESS NOTE  Suzanne Dickson:[id number] DOB: 12/13/1945 DOA: 12/21/2018 PCP: [name], MD  HPI/Recap of past 24 hours: HPI from Dr Roger Kill Minkin is a 73 y.o. 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 Patient states she was in her usual state of health until about 3 days ago when she began to have URI/allergy type symptoms with nasal congestion.  She developed a cough productive of scant streaky yellow sputum. Pt called EMS and on their arrival her home oxygen was increased to 6 L nasal cannula for work of breathing. Patient was recently admitted from 11/24/2018-11/25/2018 for COPD exacerbation. In the ED, SPO2 96% on 6 L supplemental O2 via [place]. Labs notable for WBC 13, SARS-CoV-2 test was negative. Chest x-ray showed postoperative changes in the left midlung with stable scarring and questionable developing patchy opacity in the left infrahilar lung. Pt admitted for further management.    Today, patient reports feeling slightly better, still short of breath, not back to her baseline.  Denies any chest pain, nausea/vomiting, fever/chills, worsening cough  Assessment/Plan: Principal Problem:   Acute on chronic respiratory failure with hypoxia (HCC) Active Problems:   Essential hypertension   Chronic respiratory failure with hypoxia (HCC)   COPD exacerbation (HCC)   OSA on CPAP   CAD (coronary artery disease)   Anemia of chronic disease   Acute on chronic respiratory failure with hypoxia due to COPD exacerbation Ongoing, continues to require about 6 L of O2 (home O2 2 to 4 L) Currently afebrile, with now leukocytosis (steroids) Covid  screening test negative Respiratory virus panel negative Procalcitonin elevated Urine strep pneumo, Legionella pending CXR with questionable developing opacity in the left lung Continue scheduled duo nebs, as needed albuterol nebulizer, IV Solu-Medrol 40 mg twice daily Continue both oral doxycycline and IV ceftriaxone Monitor closely  Hyperglycemia A1c 5.4 Likely 2/2 steroid use SSI, Accu-Cheks for now  Asymmetric lower extremity edema Right greater than left Bilateral lower extremity venous Doppler negative for DVT  CAD s/p DES Currently chest pain-free Continue Toprol-XL, aspirin, and Plavix, may consider holding beta-blocker due to COPD exacerbation Not on home statins  Hypertension Continue home amlodipine, losartan, Toprol-XL  Anemia of chronic disease and iron deficiency Hemoglobin stable at baseline Daily CBC  Depression with anxiety Continue sertraline  OSA Uses CPAP intermittently at home Continue CPAP at night.        Malnutrition Type:      Malnutrition Characteristics:      Nutrition Interventions:       Estimated body mass index is 27.53 kg/m as calculated from the following:   Height as of this encounter: 5\' 3"  (1.6 m).   Weight as of this encounter: 70.5 kg.     Code Status: Full  Family Communication: None at bedside  Disposition Plan: To be determined   Consultants:  None  Procedures:  None  Antimicrobials:  Ceftriaxone  Doxycycline  DVT prophylaxis: Lovenox   Objective: Vitals:   12/23/18 0553 12/23/18 0842 12/23/18 0859 12/23/18 1418  BP: (!) 153/83  134/62   Pulse: 90  95   Resp: 20  20   Temp: 97.9 F (36.6 C)  98.3 F (36.8 C)  TempSrc:   Oral   SpO2: 94% 98% 98% 91%  Weight:      Height:        Intake/Output Summary (Last 24 hours) at 12/23/2018 1647 Last data filed at 12/23/2018 1603 Gross per 24 hour  Intake 890 ml  Output 0 ml  Net 890 ml   Filed Weights   12/21/18 2159  12/22/18 2109  Weight: 70.8 kg 70.5 kg    Exam:  General: NAD, acutely ill-appearing  Cardiovascular: S1, S2 present  Respiratory: Diminished breath sounds bilaterally  Abdomen: Soft, nontender, nondistended, bowel sounds present  Musculoskeletal: Trace bilateral pedal edema noted  Skin: Normal  Psychiatry: Normal mood    Data Reviewed: CBC: Recent Labs  Lab 12/21/18 2200 12/22/18 0621 12/23/18 0506  WBC 13.7* 8.1 11.9*  NEUTROABS 9.3*  --  10.6*  HGB 8.6* 8.4* 8.0*  HCT 30.4* 30.0* 27.7*  MCV 92.1 89.6 89.9  PLT 284 224 638   Basic Metabolic Panel: Recent Labs  Lab 12/21/18 2200 12/22/18 0621 12/23/18 0506  NA 142 141 142  K 4.2 4.6 4.4  CL 107 105 108  CO2 25 20* 26  GLUCOSE 153* 213* 149*  BUN 17 20 25*  CREATININE 1.23* 1.02* 0.98  CALCIUM 9.2 9.2 9.4   GFR: Estimated Creatinine Clearance: 48.1 mL/min (by C-G formula based on SCr of 0.98 mg/dL). Liver Function Tests: Recent Labs  Lab 12/21/18 2200  AST 28  ALT 22  ALKPHOS 59  BILITOT 0.7  PROT 6.9  ALBUMIN 3.8   No results for input(s): LIPASE, AMYLASE in the last 168 hours. No results for input(s): AMMONIA in the last 168 hours. Coagulation Profile: No results for input(s): INR, PROTIME in the last 168 hours. Cardiac Enzymes: No results for input(s): CKTOTAL, CKMB, CKMBINDEX, TROPONINI in the last 168 hours. BNP (last 3 results) No results for input(s): PROBNP in the last 8760 hours. HbA1C: Recent Labs    12/22/18 1816  HGBA1C 5.4   CBG: Recent Labs  Lab 12/23/18 0650 12/23/18 1110  GLUCAP 141* 123*   Lipid Profile: No results for input(s): CHOL, HDL, LDLCALC, TRIG, CHOLHDL, LDLDIRECT in the last 72 hours. Thyroid Function Tests: No results for input(s): TSH, T4TOTAL, FREET4, T3FREE, THYROIDAB in the last 72 hours. Anemia Panel: No results for input(s): VITAMINB12, FOLATE, FERRITIN, TIBC, IRON, RETICCTPCT in the last 72 hours. Urine analysis:    Component Value  Date/Time   COLORURINE YELLOW 09/02/2014 2138   APPEARANCEUR CLEAR 09/02/2014 2138   LABSPEC 1.019 09/02/2014 2138   PHURINE 5.0 09/02/2014 2138   GLUCOSEU NEGATIVE 09/02/2014 2138   HGBUR NEGATIVE 09/02/2014 2138   BILIRUBINUR NEGATIVE 09/02/2014 2138   KETONESUR NEGATIVE 09/02/2014 2138   PROTEINUR NEGATIVE 09/02/2014 2138   UROBILINOGEN 0.2 09/02/2014 2138   NITRITE NEGATIVE 09/02/2014 2138   LEUKOCYTESUR NEGATIVE 09/02/2014 2138   Sepsis Labs: @LABRCNTIP (procalcitonin:4,lacticidven:4)  ) Recent Results (from the past 240 hour(s))  SARS CORONAVIRUS 2 (TAT 6-24 HRS) Nasopharyngeal Nasopharyngeal Swab     Status: None   Collection Time: 12/21/18 10:09 PM   Specimen: Nasopharyngeal Swab  Result Value Ref Range Status   SARS Coronavirus 2 NEGATIVE NEGATIVE Final    Comment: (NOTE) SARS-CoV-2 target nucleic acids are NOT DETECTED. The SARS-CoV-2 RNA is generally detectable in upper and lower respiratory specimens during the acute phase of infection. Negative results do not preclude SARS-CoV-2 infection, do not rule out co-infections with other pathogens, and should not be used as the sole basis for treatment or  other patient management decisions. Negative results must be combined with clinical observations, patient history, and epidemiological information. The expected result is Negative. Fact Sheet for Patients: SugarRoll.be Fact Sheet for Healthcare Providers: https://www.woods-mathews.com/ This test is not yet approved or cleared by the [place] FDA and  has been authorized for detection and/or diagnosis of SARS-CoV-2 by FDA under an Emergency Use Authorization (EUA). This EUA will remain  in effect (meaning this test can be used) for the duration of the COVID-19 declaration under Section 56 4(b)(1) of the Act, 21 U.S.C. section 360bbb-3(b)(1), unless the authorization is terminated or revoked sooner. Performed at [facility], [place] [address], [place], [place] [address]   Respiratory Panel by PCR     Status: None   Collection Time: 12/22/18  8:42 AM   Specimen: Nasopharyngeal Swab; Respiratory  Result Value Ref Range Status   Adenovirus NOT DETECTED NOT DETECTED Final   Coronavirus 229E NOT DETECTED NOT DETECTED Final    Comment: (NOTE) The Coronavirus on the Respiratory Panel, DOES NOT test for the novel  Coronavirus (2019 nCoV)    Coronavirus HKU1 NOT DETECTED NOT DETECTED Final   Coronavirus NL63 NOT DETECTED NOT DETECTED Final   Coronavirus OC43 NOT DETECTED NOT DETECTED Final   Metapneumovirus NOT DETECTED NOT DETECTED Final   Rhinovirus / Enterovirus NOT DETECTED NOT DETECTED Final   Influenza A NOT DETECTED NOT DETECTED Final   Influenza B NOT DETECTED NOT DETECTED Final   Parainfluenza Virus 1 NOT DETECTED NOT DETECTED Final   Parainfluenza Virus 2 NOT DETECTED NOT DETECTED Final   Parainfluenza Virus 3 NOT DETECTED NOT DETECTED Final   Parainfluenza Virus 4 NOT DETECTED NOT DETECTED Final   Respiratory Syncytial Virus NOT DETECTED NOT DETECTED Final   Bordetella pertussis NOT DETECTED NOT DETECTED Final   Chlamydophila pneumoniae NOT DETECTED NOT DETECTED Final   Mycoplasma pneumoniae NOT DETECTED NOT DETECTED Final    Comment: Performed at [facility], [place]. [address], [place], [place] [address]      Studies: No results found.  Scheduled Meds: . amLODipine  5 mg Oral q morning - 10a  . aspirin EC  81 mg Oral q morning - 10a  . clopidogrel  75 mg Oral q morning - 10a  . doxycycline  100 mg Oral Q12H  . enoxaparin (LOVENOX) injection  40 mg Subcutaneous Q24H  . ferrous sulfate  325 mg Oral BID WC  . guaiFENesin  600 mg Oral BID  . insulin aspart  0-9 Units Subcutaneous TID WC  . ipratropium-albuterol  3 mL Nebulization Q6H  . loratadine  10 mg Oral Daily  . losartan  50 mg Oral q morning - 10a  . methylPREDNISolone (SOLU-MEDROL) injection  40 mg Intravenous Q12H  .  metoprolol succinate  25 mg Oral q morning - 10a  . sertraline  100 mg Oral BID    Continuous Infusions: . cefTRIAXone (ROCEPHIN)  IV 1 g (12/23/18 1129)     LOS: 1 day     Alma Friendly, MD [facility]  If 7PM-7AM, please contact night-coverage [email or web address] 12/23/2018, 4:47 PM

## 2018-12-24 LAB — CBC WITH DIFFERENTIAL/PLATELET
Abs Immature Granulocytes: 0.09 10*3/uL — ABNORMAL HIGH (ref 0.00–0.07)
Basophils Absolute: 0 10*3/uL (ref 0.0–0.1)
Basophils Relative: 0 %
Eosinophils Absolute: 0 10*3/uL (ref 0.0–0.5)
Eosinophils Relative: 0 %
HCT: 29.9 % — ABNORMAL LOW (ref 36.0–46.0)
Hemoglobin: 8.5 g/dL — ABNORMAL LOW (ref 12.0–15.0)
Immature Granulocytes: 1 %
Lymphocytes Relative: 7 %
Lymphs Abs: 0.9 10*3/uL (ref 0.7–4.0)
MCH: 25.5 pg — ABNORMAL LOW (ref 26.0–34.0)
MCHC: 28.4 g/dL — ABNORMAL LOW (ref 30.0–36.0)
MCV: 89.8 fL (ref 80.0–100.0)
Monocytes Absolute: 0.3 10*3/uL (ref 0.1–1.0)
Monocytes Relative: 2 %
Neutro Abs: 10.8 10*3/uL — ABNORMAL HIGH (ref 1.7–7.7)
Neutrophils Relative %: 90 %
Platelets: 244 10*3/uL (ref 150–400)
RBC: 3.33 MIL/uL — ABNORMAL LOW (ref 3.87–5.11)
RDW: 15.2 % (ref 11.5–15.5)
WBC: 12 10*3/uL — ABNORMAL HIGH (ref 4.0–10.5)
nRBC: 0 % (ref 0.0–0.2)

## 2018-12-24 LAB — BASIC METABOLIC PANEL
Anion gap: 12 (ref 5–15)
BUN: 29 mg/dL — ABNORMAL HIGH (ref 8–23)
CO2: 22 mmol/L (ref 22–32)
Calcium: 9.4 mg/dL (ref 8.9–10.3)
Chloride: 107 mmol/L (ref 98–111)
Creatinine, Ser: 1.02 mg/dL — ABNORMAL HIGH (ref 0.44–1.00)
GFR calc Af Amer: >60 mL/min (ref >60)
GFR calc non Af Amer: 55 mL/min — ABNORMAL LOW (ref >60)
Glucose, Bld: 134 mg/dL — ABNORMAL HIGH (ref 70–99)
Potassium: 4.4 mmol/L (ref 3.5–5.1)
Sodium: 141 mmol/L (ref 135–145)

## 2018-12-24 LAB — GLUCOSE, CAPILLARY
Glucose-Capillary: 146 mg/dL — ABNORMAL HIGH (ref 70–99)
Glucose-Capillary: 86 mg/dL (ref 70–99)
Glucose-Capillary: 101 mg/dL — ABNORMAL HIGH (ref 70–99)
Glucose-Capillary: 120 mg/dL — ABNORMAL HIGH (ref 70–99)

## 2018-12-24 MED ORDER — PREDNISONE 20 MG PO TABS
40.0000 mg | ORAL_TABLET | Freq: Every day | ORAL | Status: DC
  Administered 2018-12-25 – 2018-12-26 (×2): 40 mg via ORAL
  Filled 2018-12-24 (×2): qty 2

## 2018-12-24 NOTE — Progress Notes (Signed)
PROGRESS NOTE  Suzanne Dickson [facility]:[id number] DOB: 05/22/1945 DOA: 12/21/2018 PCP: [name], MD  HPI/Recap of past 24 hours: HPI from Dr Roger Kill Wachtel is a 73 y.o. 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 Patient states she was in her usual state of health until about 3 days ago when she began to have URI/allergy type symptoms with nasal congestion.  She developed a cough productive of scant streaky yellow sputum. Pt called EMS and on their arrival her home oxygen was increased to 6 L nasal cannula for work of breathing. Patient was recently admitted from 11/24/2018-11/25/2018 for COPD exacerbation. In the ED, SPO2 96% on 6 L supplemental O2 via . Labs notable for WBC 13, SARS-CoV-2 test was negative. Chest x-ray showed postoperative changes in the left midlung with stable scarring and questionable developing patchy opacity in the left infrahilar lung. Pt admitted for further management.    Today, patient reports shortness of breath is slightly better, noted to still desaturate to the 80s on 6 L of oxygen while ambulation.  Mild generalized wheezing noted.  Patient denies any chest pain, nausea/vomiting, fever/chills.  Assessment/Plan: Principal Problem:   Acute on chronic respiratory failure with hypoxia (HCC) Active Problems:   Essential hypertension   Chronic respiratory failure with hypoxia (HCC)   COPD exacerbation (HCC)   OSA on CPAP   CAD (coronary artery disease)   Anemia of chronic disease   Acute on chronic respiratory failure with hypoxia due to COPD exacerbation Ongoing, continues to require about 6 L of O2, especially on  ambulation (home O2 2 to 4 L) Currently afebrile, with now leukocytosis (steroids) Covid screening test negative Respiratory virus panel negative Procalcitonin elevated Urine strep pneumo, Legionella pending CXR with questionable developing opacity in the left lung Continue scheduled duo nebs, as needed albuterol nebulizer, switch IV Solu-Medrol to p.o. Pred Continue both oral doxycycline and IV ceftriaxone Monitor closely  Hyperglycemia A1c 5.4 Likely 2/2 steroid use SSI, Accu-Cheks for now  Asymmetric lower extremity edema Right greater than left Bilateral lower extremity venous Doppler negative for DVT  CAD s/p DES Currently chest pain-free Continue Toprol-XL, aspirin, and Plavix Not on home statins  Hypertension Continue home amlodipine, losartan, Toprol-XL  Anemia of chronic disease and iron deficiency Hemoglobin stable at baseline Daily CBC  Depression with anxiety Continue sertraline  OSA Uses CPAP intermittently at home Continue CPAP at night.        Malnutrition Type:      Malnutrition Characteristics:      Nutrition Interventions:       Estimated body mass index is 27.53 kg/m as calculated from the following:   Height as of this encounter: 5\' 3"  (1.6 m).   Weight as of this encounter: 70.5 kg.     Code Status: Full  Family Communication: None at bedside  Disposition Plan: Likely home on 12/25/2018 if patient continues to improve   Consultants:  None  Procedures:  None  Antimicrobials:  Ceftriaxone  Doxycycline  DVT prophylaxis: Lovenox   Objective: Vitals:   12/24/18 0859 12/24/18 0918 12/24/18 1456 12/24/18 1637  BP:  (!) 145/61  (!) 148/70  Pulse:  83  87  Resp:  18  20  Temp:  98.2 F (36.8 C)  97.6 F (36.4 C)  TempSrc:  Oral  Oral  SpO2: 97% 97% 98% 97%  Weight:      Height:        Intake/Output Summary (Last 24 hours) at 12/24/2018 1648 Last data filed at 12/24/2018 1300 Gross per 24 hour   Intake 840 ml  Output 300 ml  Net 540 ml   Filed Weights   12/21/18 2159 12/22/18 2109 12/23/18 2132  Weight: 70.8 kg 70.5 kg 70.5 kg    Exam:  General: NAD, acutely ill-appearing  Cardiovascular: S1, S2 present  Respiratory:  Diminished breath sounds bilaterally, some mild wheezing noted  Abdomen: Soft, nontender, nondistended, bowel sounds present  Musculoskeletal: No bilateral pedal edema noted  Skin: Normal  Psychiatry: Normal mood    Data Reviewed: CBC: Recent Labs  Lab 12/21/18 2200 12/22/18 0621 12/23/18 0506 12/24/18 0559  WBC 13.7* 8.1 11.9* 12.0*  NEUTROABS 9.3*  --  10.6* 10.8*  HGB 8.6* 8.4* 8.0* 8.5*  HCT 30.4* 30.0* 27.7* 29.9*  MCV 92.1 89.6 89.9 89.8  PLT 284 224 225 814   Basic Metabolic Panel: Recent Labs  Lab 12/21/18 2200 12/22/18 0621 12/23/18 0506 12/24/18 0559  NA 142 141 142 141  K 4.2 4.6 4.4 4.4  CL 107 105 108 107  CO2 25 20* 26 22  GLUCOSE 153* 213* 149* 134*  BUN 17 20 25* 29*  CREATININE 1.23* 1.02* 0.98 1.02*  CALCIUM 9.2 9.2 9.4 9.4   GFR: Estimated Creatinine Clearance: 46.2 mL/min (A) (by C-G formula based on SCr of 1.02 mg/dL (H)). Liver Function Tests: Recent Labs  Lab 12/21/18 2200  AST 28  ALT 22  ALKPHOS 59  BILITOT 0.7  PROT 6.9  ALBUMIN 3.8   No results for input(s): LIPASE, AMYLASE in the last 168 hours. No results for input(s): AMMONIA in the last 168 hours. Coagulation Profile: No results for input(s): INR, PROTIME in the last 168 hours. Cardiac Enzymes: No results for input(s): CKTOTAL, CKMB, CKMBINDEX, TROPONINI in the last 168 hours. BNP (last 3 results) No results for input(s): PROBNP in the last 8760 hours. HbA1C: Recent Labs    12/22/18 1816  HGBA1C 5.4   CBG: Recent Labs  Lab 12/23/18 1658 12/23/18 2133 12/24/18 0707 12/24/18 1149 12/24/18 1646  GLUCAP 107* 87 146* 86 101*   Lipid Profile: No results for input(s): CHOL, HDL, LDLCALC, TRIG, CHOLHDL, LDLDIRECT in the last  72 hours. Thyroid Function Tests: No results for input(s): TSH, T4TOTAL, FREET4, T3FREE, THYROIDAB in the last 72 hours. Anemia Panel: No results for input(s): VITAMINB12, FOLATE, FERRITIN, TIBC, IRON, RETICCTPCT in the last 72 hours. Urine analysis:    Component Value Date/Time   COLORURINE YELLOW 09/02/2014 2138   APPEARANCEUR CLEAR 09/02/2014 2138   LABSPEC 1.019 09/02/2014 2138   PHURINE 5.0 09/02/2014 2138   GLUCOSEU NEGATIVE 09/02/2014 2138   HGBUR NEGATIVE 09/02/2014 2138   BILIRUBINUR NEGATIVE 09/02/2014 2138   KETONESUR NEGATIVE 09/02/2014 2138   PROTEINUR NEGATIVE 09/02/2014 2138   UROBILINOGEN 0.2 09/02/2014 2138   NITRITE NEGATIVE 09/02/2014 2138   LEUKOCYTESUR NEGATIVE 09/02/2014 2138   Sepsis Labs: @LABRCNTIP (procalcitonin:4,lacticidven:4)  ) Recent Results (from the past 240 hour(s))  SARS CORONAVIRUS 2 (TAT 6-24 HRS) Nasopharyngeal Nasopharyngeal Swab     Status: None   Collection Time: 12/21/18 10:09 PM   Specimen: Nasopharyngeal Swab  Result Value Ref Range Status   SARS Coronavirus 2 NEGATIVE NEGATIVE Final  Comment: (NOTE) SARS-CoV-2 target nucleic acids are NOT DETECTED. The SARS-CoV-2 RNA is generally detectable in upper and lower respiratory specimens during the acute phase of infection. Negative results do not preclude SARS-CoV-2 infection, do not rule out co-infections with other pathogens, and should not be used as the sole basis for treatment or other patient management decisions. Negative results must be combined with clinical observations, patient history, and epidemiological information. The expected result is Negative. Fact Sheet for Patients: SugarRoll.be Fact Sheet for Healthcare Providers: https://www.woods-mathews.com/ This test is not yet approved or cleared by the [place] FDA and  has been authorized for detection and/or diagnosis of SARS-CoV-2 by FDA under an Emergency Use  Authorization (EUA). This EUA will remain  in effect (meaning this test can be used) for the duration of the COVID-19 declaration under Section 56 4(b)(1) of the Act, 21 U.S.C. section 360bbb-3(b)(1), unless the authorization is terminated or revoked sooner. Performed at [facility], [place] [address], [place], [place] [address]   Respiratory Panel by PCR     Status: None   Collection Time: 12/22/18  8:42 AM   Specimen: Nasopharyngeal Swab; Respiratory  Result Value Ref Range Status   Adenovirus NOT DETECTED NOT DETECTED Final   Coronavirus 229E NOT DETECTED NOT DETECTED Final    Comment: (NOTE) The Coronavirus on the Respiratory Panel, DOES NOT test for the novel  Coronavirus (2019 nCoV)    Coronavirus HKU1 NOT DETECTED NOT DETECTED Final   Coronavirus NL63 NOT DETECTED NOT DETECTED Final   Coronavirus OC43 NOT DETECTED NOT DETECTED Final   Metapneumovirus NOT DETECTED NOT DETECTED Final   Rhinovirus / Enterovirus NOT DETECTED NOT DETECTED Final   Influenza A NOT DETECTED NOT DETECTED Final   Influenza B NOT DETECTED NOT DETECTED Final   Parainfluenza Virus 1 NOT DETECTED NOT DETECTED Final   Parainfluenza Virus 2 NOT DETECTED NOT DETECTED Final   Parainfluenza Virus 3 NOT DETECTED NOT DETECTED Final   Parainfluenza Virus 4 NOT DETECTED NOT DETECTED Final   Respiratory Syncytial Virus NOT DETECTED NOT DETECTED Final   Bordetella pertussis NOT DETECTED NOT DETECTED Final   Chlamydophila pneumoniae NOT DETECTED NOT DETECTED Final   Mycoplasma pneumoniae NOT DETECTED NOT DETECTED Final    Comment: Performed at [facility], [place]. [address] [place], [place] [address]      Studies: No results found.  Scheduled Meds: . amLODipine  5 mg Oral q morning - 10a  . aspirin EC  81 mg Oral q morning - 10a  . clopidogrel  75 mg Oral q morning - 10a  . doxycycline  100 mg Oral Q12H  . enoxaparin (LOVENOX) injection  40 mg Subcutaneous Q24H  . ferrous sulfate  325 mg Oral BID  WC  . guaiFENesin  600 mg Oral BID  . insulin aspart  0-9 Units Subcutaneous TID WC  . ipratropium-albuterol  3 mL Nebulization Q6H  . loratadine  10 mg Oral Daily  . losartan  50 mg Oral q morning - 10a  . metoprolol succinate  25 mg Oral q morning - 10a  . [START ON 12/25/2018] predniSONE  40 mg Oral Q breakfast  . sertraline  100 mg Oral BID    Continuous Infusions: . cefTRIAXone (ROCEPHIN)  IV 1 g (12/24/18 1504)     LOS: 2 days     Alma Friendly, MD [facility]  If 7PM-7AM, please contact night-coverage [email or web address] 12/24/2018, 4:48 PM

## 2018-12-24 NOTE — Evaluation (Addendum)
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 dx: acute on chronic respiratory failure with hypoxia, COPD exacerbation   Clinical Impression   Patient evaluated by Occupational Therapy with no further acute OT needs identified. All education has been completed and the patient has no further questions. Pt is able to perform ADLs at supervision level. 02 sats decreased to 84% on 6L supplemental 02, but rebounded to 93% with pursed lip breathing.  She has good awareness of energy conservation strategies and has all needed DME.   See below for any follow-up Occupational Therapy or equipment needs. OT is signing off. Thank you for this referral.      Follow Up Recommendations  No OT follow up;Supervision - Intermittent    Equipment Recommendations  None recommended by OT    Recommendations for Other Services       Precautions / Restrictions Precautions Precautions: Fall Restrictions Weight Bearing Restrictions: No      Mobility Bed Mobility Overal bed mobility: Modified Independent                Transfers Overall transfer level: Needs assistance Equipment used: Rolling walker (2 wheeled) Transfers: Sit to/from [facility] Sit to Stand: Supervision Stand pivot transfers: Supervision            Balance Overall balance assessment: Mild deficits observed, not formally tested                                         ADL either performed or assessed with clinical judgement   ADL Overall ADL's : Needs assistance/impaired Eating/Feeding: Independent   Grooming: Wash/dry hands;Wash/dry face;Oral care;Brushing  hair;Supervision/safety;Standing   Upper Body Bathing: Set up;Sitting   Lower Body Bathing: Supervison/ safety;Sit to/from stand   Upper Body Dressing : Set up;Sitting   Lower Body Dressing: Supervision/safety;Sit to/from stand   Toilet Transfer: Supervision/safety;Ambulation;Comfort height toilet;RW   Toileting- Clothing Manipulation and Hygiene: Supervision/safety;Sit to/from stand       Functional mobility during ADLs: Supervision/safety;Rolling walker General ADL Comments: 02 sats 84% on 6L supplemental 02 while standing at sink to perform grooming.  Sats improved to 93% with pursed lip breathing      Vision Baseline Vision/History: Wears glasses Wears Glasses: At all times Patient Visual Report: No change from baseline       Perception     Praxis      Pertinent Vitals/Pain Pain Assessment: No/denies pain     Hand Dominance Right   Extremity/Trunk Assessment Upper Extremity Assessment Upper Extremity Assessment: Overall WFL for tasks assessed   Lower Extremity Assessment Lower Extremity Assessment: Defer to PT evaluation   Cervical / Trunk Assessment Cervical / Trunk Assessment: Normal   Communication Communication Communication: No difficulties   Cognition Arousal/Alertness: Awake/alert Behavior During Therapy: WFL for tasks assessed/performed Overall Cognitive Status: Within Functional Limits for tasks assessed                                     General Comments  Pt  demonstrates good awareness of energy conservation strategies.  She does report she has had episodes of depression and anxiety due to SOB, and her inability to perform activities she once was able to do, and increased anxiety due to [place].  Discussed focusing on activities she enjoys and conserving her energy for thos activities.  Provided her with written info re: mind/cognitive apps and mindfulness apps     Exercises     Shoulder Instructions      Home Living  Family/patient expects to be discharged to:: Private residence Living Arrangements: Spouse/significant other Available Help at Discharge: Family;Available PRN/intermittently Type of Home: House Home Access: Stairs to enter [employer] of Steps: 2 Entrance Stairs-Rails: None Home Layout: Two level;Able to live on main level with bedroom/bathroom     Bathroom Shower/Tub: Occupational psychologist: Standard Bathroom Accessibility: Yes   Home Equipment: Environmental consultant - 4 wheels;Shower seat;Hand held shower head;Toilet riser   Additional Comments: spouse very supportive per pt report       Prior Functioning/Environment Level of Independence: Independent with assistive device(s)        Comments: Pt reports she has been mod I with ADLs, but requires assist with IADLs.  She has been through pulmonary rehab         OT Problem List: Decreased activity tolerance;Cardiopulmonary status limiting activity      OT Treatment/Interventions:      OT Goals(Current goals can be found in the care plan section) Acute Rehab OT Goals Patient Stated Goal: to breathe better  OT Goal Formulation: With patient Time For Goal Achievement: 01/07/19 Potential to Achieve Goals: Good  OT Frequency:     Barriers to D/C:            Co-evaluation              AM-PAC OT "6 Clicks" Daily Activity     Outcome Measure Help from another person eating meals?: None Help from another person taking care of personal grooming?: A Little Help from another person toileting, which includes using toliet, bedpan, or urinal?: A Little Help from another person bathing (including washing, rinsing, drying)?: A Little Help from another person to put on and taking off regular upper body clothing?: A Little Help from another person to put on and taking off regular lower body clothing?: A Little 6 Click Score: 19   End of Session Equipment Utilized During Treatment: Rolling walker;Oxygen Nurse  Communication: Mobility status  Activity Tolerance: Patient limited by fatigue Patient left: in bed;with call bell/phone within reach  OT Visit Diagnosis: Muscle weakness (generalized) (M62.81)                Time: 7711-6579 OT Time Calculation (min): 23 min Charges:  OT General Charges $OT Visit: 1 Visit OT eval mod  OT Treatments $Self Care/Home Management : 8-22 mins  Lucille Passy, OTR/L [place] Pager [phone] Office [phone]   Lucille Passy M 12/24/2018, 11:51 AM

## 2018-12-25 DIAGNOSIS — J9611 Chronic respiratory failure with hypoxia: Secondary | ICD-10-CM

## 2018-12-25 LAB — BASIC METABOLIC PANEL
Anion gap: 9 (ref 5–15)
BUN: 30 mg/dL — ABNORMAL HIGH (ref 8–23)
CO2: 26 mmol/L (ref 22–32)
Calcium: 9.3 mg/dL (ref 8.9–10.3)
Chloride: 107 mmol/L (ref 98–111)
Creatinine, Ser: 1.11 mg/dL — ABNORMAL HIGH (ref 0.44–1.00)
GFR calc Af Amer: 57 mL/min — ABNORMAL LOW (ref >60)
GFR calc non Af Amer: 49 mL/min — ABNORMAL LOW (ref >60)
Glucose, Bld: 107 mg/dL — ABNORMAL HIGH (ref 70–99)
Potassium: 4.5 mmol/L (ref 3.5–5.1)
Sodium: 142 mmol/L (ref 135–145)

## 2018-12-25 LAB — GLUCOSE, CAPILLARY
Glucose-Capillary: 95 mg/dL (ref 70–99)
Glucose-Capillary: 97 mg/dL (ref 70–99)
Glucose-Capillary: 209 mg/dL — ABNORMAL HIGH (ref 70–99)
Glucose-Capillary: 275 mg/dL — ABNORMAL HIGH (ref 70–99)

## 2018-12-25 LAB — CBC WITH DIFFERENTIAL/PLATELET
Abs Immature Granulocytes: 0.14 10*3/uL — ABNORMAL HIGH (ref 0.00–0.07)
Basophils Absolute: 0 10*3/uL (ref 0.0–0.1)
Basophils Relative: 0 %
Eosinophils Absolute: 0 10*3/uL (ref 0.0–0.5)
Eosinophils Relative: 0 %
HCT: 30.2 % — ABNORMAL LOW (ref 36.0–46.0)
Hemoglobin: 8.7 g/dL — ABNORMAL LOW (ref 12.0–15.0)
Immature Granulocytes: 1 %
Lymphocytes Relative: 15 %
Lymphs Abs: 1.8 10*3/uL (ref 0.7–4.0)
MCH: 26 pg (ref 26.0–34.0)
MCHC: 28.8 g/dL — ABNORMAL LOW (ref 30.0–36.0)
MCV: 90.4 fL (ref 80.0–100.0)
Monocytes Absolute: 0.9 10*3/uL (ref 0.1–1.0)
Monocytes Relative: 8 %
Neutro Abs: 9.1 10*3/uL — ABNORMAL HIGH (ref 1.7–7.7)
Neutrophils Relative %: 76 %
Platelets: 228 10*3/uL (ref 150–400)
RBC: 3.34 MIL/uL — ABNORMAL LOW (ref 3.87–5.11)
RDW: 15.5 % (ref 11.5–15.5)
WBC: 12 10*3/uL — ABNORMAL HIGH (ref 4.0–10.5)
nRBC: 0 % (ref 0.0–0.2)

## 2018-12-25 MED ORDER — IPRATROPIUM-ALBUTEROL 0.5-2.5 (3) MG/3ML IN SOLN
3.0000 mL | Freq: Three times a day (TID) | RESPIRATORY_TRACT | Status: DC
  Administered 2018-12-26 (×2): 3 mL via RESPIRATORY_TRACT
  Filled 2018-12-25 (×2): qty 3

## 2018-12-25 MED ORDER — METHYLPREDNISOLONE SODIUM SUCC 40 MG IJ SOLR
40.0000 mg | Freq: Two times a day (BID) | INTRAMUSCULAR | Status: AC
  Administered 2018-12-25 – 2018-12-26 (×2): 40 mg via INTRAVENOUS
  Filled 2018-12-25 (×2): qty 1

## 2018-12-25 NOTE — Progress Notes (Addendum)
Ambulated patient on hallway with O2 per [place] at 4 L and O2 saturation dropped between 86-91% noted with patient getting dyspneic after 50 feet.

## 2018-12-25 NOTE — Progress Notes (Signed)
[facility] PROGRESS NOTE    Progress Note  Suzanne Dickson  EPP:[id number] DOB: 1945-04-21 DOA: 12/21/2018 PCP: [name], MD     Brief Narrative:   Suzanne Dickson is an 73 y.o. 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  In the ED she was noted to be satting at 96% on 6 L, with a white count of 13 SARS-CoV-2 test negative, chest x-ray showed postoperative changes.  Assessment/Plan:   Acute on chronic respiratory failure with hypoxia due to COPD exacerbation: She is currently acquiring 6 to 5 L to keep saturations greater than 94%, she still having significant wheezing on physical exam. Ambulating check saturations with ambulation's at 4 L. Fairly afebrile with no leukocytosis. Procalcitonin was elevated on admission. Continue steroids and empiric antibiotics.  Change steroids were IV.  HyperGlycemia: With an A1c of 5.4. She is currently on steroids for COPD exacerbation. Early control with sliding scale insulin.  Continue current regimen.  Asymmetric lower extremity edema: Bilateral lower extremity Doppler was negative for DVT.  CAD status post DES: Continue Toprol aspirin and Plavix.  Essential hypertension: Continue amlodipine losartan and Toprol.  Anemia of chronic disease: Hemoglobin seems to be stable.  Depression with anxiety: Continue sertraline.   Obstructive sleep apnea : Continue CPAP intermittently at night.     DVT prophylaxis: lovenox Family Communication:none Disposition Plan/Barrier to D/C: home in am Code Status:     Code Status Orders  (From admission, onward)         Start     Ordered   12/22/18 0054  Full code  Continuous     12/22/18 0058        Code Status History    Date Active Date Inactive Code Status Order ID Comments User Context   11/24/2018 1146 11/25/2018 1707 Full Code [id number]  Norval Morton, MD ED   04/05/2017 1328 04/06/2017 1720 Full Code [id number]  [name], MD Inpatient   02/17/2017 1142 02/18/2017 1633 Full Code [id number]  Dixie Dials, MD Inpatient   02/17/2017 0925 02/17/2017 1142 Full Code [id number]  [name], MD Inpatient   09/02/2014 1416 09/04/2014 1727 Full Code [id number]  Karen Kitchens Inpatient   Advance Care Planning Activity        IV Access:    Peripheral IV   Procedures and diagnostic studies:   No results found.   Medical Consultants:    None.  Anti-Infectives:   IV doxycycline.  Subjective:    Suzanne Dickson relates her breathing is better but not back to baseline.  Objective:    Vitals:   12/24/18 2119 12/25/18 0021 12/25/18 0450 12/25/18 0912  BP: (!) 161/78  (!) 147/90   Pulse: 84 78 75   Resp: 16 20 18    Temp: 97.8 F (36.6 C)  98 F (36.7 C)   TempSrc: Oral  Oral   SpO2:  98% 99% 95%  Weight:      Height:       SpO2: 95 % O2 Flow Rate (L/min): (S) 5 L/min   Intake/Output Summary (Last 24 hours) at 12/25/2018 1215 Last data filed at 12/25/2018 1000 Gross per 24 hour  Intake 480 ml  Output 0 ml  Net 480 ml   Filed Weights   12/21/18 2159 12/22/18 2109 12/23/18 2132  Weight: 70.8 kg 70.5 kg 70.5 kg    Exam: General exam: In no acute distress. Respiratory system: Good air movement and wheezing bilaterally. Cardiovascular system: S1 & S2 heard, RRR. No JVD. Gastrointestinal system: Abdomen is  nondistended, soft and nontender.  Central nervous system: Alert and oriented. No focal neurological deficits. Extremities: No pedal edema. Skin: No rashes, lesions or ulcers Psychiatry: Judgement and insight appear normal. Mood & affect appropriate.    Data Reviewed:    Labs: Basic Metabolic Panel: Recent Labs  Lab 12/21/18 2200 12/22/18 0621 12/23/18 0506 12/24/18 0559 12/25/18 0404  NA 142 141 142 141 142  K 4.2 4.6 4.4 4.4 4.5  CL 107 105 108 107 107  CO2 25 20* 26 22 26   GLUCOSE 153* 213* 149* 134* 107*  BUN 17 20 25* 29* 30*  CREATININE 1.23* 1.02* 0.98 1.02* 1.11*  CALCIUM 9.2 9.2 9.4 9.4 9.3   GFR Estimated Creatinine Clearance: 42.5 mL/min (A) (by C-G formula based on SCr of 1.11 mg/dL (H)). Liver Function Tests: Recent Labs  Lab 12/21/18 2200  AST 28  ALT 22  ALKPHOS 59  BILITOT 0.7  PROT 6.9  ALBUMIN 3.8   No results for input(s): LIPASE, AMYLASE in the last 168 hours. No results for input(s): AMMONIA in the last 168 hours. Coagulation profile No results for input(s): INR, PROTIME in the last 168 hours. COVID-19 Labs  No results for input(s): DDIMER, FERRITIN, LDH, CRP in the last 72 hours.  Lab Results  Component Value Date   SARSCOV2NAA NEGATIVE 12/21/2018   [place] NEGATIVE 11/24/2018    CBC: Recent Labs  Lab 12/21/18 2200 12/22/18 0621 12/23/18 0506 12/24/18 0559 12/25/18 0404  WBC 13.7* 8.1 11.9* 12.0* 12.0*  NEUTROABS 9.3*  --  10.6* 10.8* 9.1*  HGB 8.6* 8.4* 8.0* 8.5* 8.7*  HCT 30.4* 30.0* 27.7* 29.9* 30.2*  MCV 92.1 89.6 89.9 89.8 90.4  PLT 284 224 225 244 228   Cardiac Enzymes: No results for input(s): CKTOTAL, CKMB, CKMBINDEX, TROPONINI in the last 168 hours. BNP (last 3 results) No results for input(s): PROBNP in the last 8760 hours. CBG: Recent Labs  Lab 12/24/18 1149 12/24/18 1646 12/24/18 2122 12/25/18 0640 12/25/18 1133  GLUCAP 86 101* 120* 95 97   D-Dimer: No results for input(s): DDIMER in the last 72  hours. Hgb A1c: Recent Labs    12/22/18 1816  HGBA1C 5.4   Lipid Profile: No results for input(s): CHOL, HDL, LDLCALC, TRIG, CHOLHDL, LDLDIRECT in the last 72 hours. Thyroid function studies: No results for input(s): TSH, T4TOTAL, T3FREE, THYROIDAB in the last 72 hours.  Invalid input(s): FREET3 Anemia work up: No results for input(s): VITAMINB12, FOLATE, FERRITIN, TIBC, IRON, RETICCTPCT in the last 72 hours. Sepsis Labs: Recent Labs  Lab 12/22/18 0040 12/22/18 0621 12/23/18 0506 12/24/18 0559 12/25/18 0404  PROCALCITON 0.11  --   --   --   --   WBC  --  8.1 11.9* 12.0* 12.0*   Microbiology Recent Results (from the past 240 hour(s))  SARS CORONAVIRUS 2 (TAT 6-24 HRS) Nasopharyngeal Nasopharyngeal Swab  Status: None   Collection Time: 12/21/18 10:09 PM   Specimen: Nasopharyngeal Swab  Result Value Ref Range Status   SARS Coronavirus 2 NEGATIVE NEGATIVE Final    Comment: (NOTE) SARS-CoV-2 target nucleic acids are NOT DETECTED. The SARS-CoV-2 RNA is generally detectable in upper and lower respiratory specimens during the acute phase of infection. Negative results do not preclude SARS-CoV-2 infection, do not rule out co-infections with other pathogens, and should not be used as the sole basis for treatment or other patient management decisions. Negative results must be combined with clinical observations, patient history, and epidemiological information. The expected result is Negative. Fact Sheet for Patients: SugarRoll.be Fact Sheet for Healthcare Providers: https://www.woods-mathews.com/ This test is not yet approved or cleared by the [place] FDA and  has been authorized for detection and/or diagnosis of SARS-CoV-2 by FDA under an Emergency Use Authorization (EUA). This EUA will remain  in effect (meaning this test can be used) for the duration of the COVID-19 declaration under Section 56 4(b)(1) of the Act, 21 U.S.C.  section 360bbb-3(b)(1), unless the authorization is terminated or revoked sooner. Performed at [facility], [place] [address] [place], [place] [address]   Respiratory Panel by PCR     Status: None   Collection Time: 12/22/18  8:42 AM   Specimen: Nasopharyngeal Swab; Respiratory  Result Value Ref Range Status   Adenovirus NOT DETECTED NOT DETECTED Final   Coronavirus 229E NOT DETECTED NOT DETECTED Final    Comment: (NOTE) The Coronavirus on the Respiratory Panel, DOES NOT test for the novel  Coronavirus (2019 nCoV)    Coronavirus HKU1 NOT DETECTED NOT DETECTED Final   Coronavirus NL63 NOT DETECTED NOT DETECTED Final   Coronavirus OC43 NOT DETECTED NOT DETECTED Final   Metapneumovirus NOT DETECTED NOT DETECTED Final   Rhinovirus / Enterovirus NOT DETECTED NOT DETECTED Final   Influenza A NOT DETECTED NOT DETECTED Final   Influenza B NOT DETECTED NOT DETECTED Final   Parainfluenza Virus 1 NOT DETECTED NOT DETECTED Final   Parainfluenza Virus 2 NOT DETECTED NOT DETECTED Final   Parainfluenza Virus 3 NOT DETECTED NOT DETECTED Final   Parainfluenza Virus 4 NOT DETECTED NOT DETECTED Final   Respiratory Syncytial Virus NOT DETECTED NOT DETECTED Final   Bordetella pertussis NOT DETECTED NOT DETECTED Final   Chlamydophila pneumoniae NOT DETECTED NOT DETECTED Final   Mycoplasma pneumoniae NOT DETECTED NOT DETECTED Final    Comment: Performed at [facility], [place]. [address], [place], [place] [address]     Medications:   . amLODipine  5 mg Oral q morning - 10a  . aspirin EC  81 mg Oral q morning - 10a  . clopidogrel  75 mg Oral q morning - 10a  . doxycycline  100 mg Oral Q12H  . enoxaparin (LOVENOX) injection  40 mg Subcutaneous Q24H  . ferrous sulfate  325 mg Oral BID WC  . guaiFENesin  600 mg Oral BID  . insulin aspart  0-9 Units Subcutaneous TID WC  . ipratropium-albuterol  3 mL Nebulization Q6H  . loratadine  10 mg Oral Daily  . losartan  50 mg Oral q morning - 10a   . metoprolol succinate  25 mg Oral q morning - 10a  . predniSONE  40 mg Oral Q breakfast  . sertraline  100 mg Oral BID   Continuous Infusions: . cefTRIAXone (ROCEPHIN)  IV 1 g (12/24/18 1504)     LOS: 3 days   Charlynne Cousins  [facility]  12/25/2018, 12:15 PM

## 2018-12-25 NOTE — Care Management Important Message (Signed)
Important Message  Patient Details  Name: Suzanne Dickson MRN: [id number] Date of Birth: 09-Dec-1945   Medicare Important Message Given:  Yes     [name] [address] 12/25/2018, 3:55 PM

## 2018-12-25 NOTE — Progress Notes (Signed)
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 dx: acute on chronic respiratory failure with hypoxia, COPD exacerbation    PT Comments    Pt was seen for mobility and before standing watched her sats trending into 80's with O2 in place.  Initially worked on sitting resisted ex's to [employer] and noted her sats drop and recover somewhat.  Finally they remained in 80's with low of 85%.  Declined to walk pt at that point, and reported to nursing how her visit evolved.  Follow acutely as tolerated for progression of all gait and strengthening as she can perform within tolerances of sats.  Follow Up Recommendations  Home health PT;Supervision/Assistance - 24 hour;Supervision - Intermittent     Equipment Recommendations  None recommended by PT    Recommendations for Other Services       Precautions / Restrictions Precautions Precautions: Fall Precaution Comments: monitor O2 sats with all levels of activity Restrictions Weight Bearing Restrictions: No    Mobility  Bed Mobility Overal bed mobility: Modified Independent                Transfers Overall transfer level: Needs assistance               General transfer comment: did not stand since O2 sats were down to 85% with exercise on bedside alone  Ambulation/Gait                 Stairs             Wheelchair Mobility    Modified Rankin (Stroke Patients Only)       Balance Overall balance assessment: Mild deficits observed, not formally tested                                          Cognition Arousal/Alertness: Awake/alert Behavior During Therapy: WFL for  tasks assessed/performed Overall Cognitive Status: Within Functional Limits for tasks assessed                                        Exercises General Exercises - Lower Extremity Ankle Circles/Pumps: AROM;5 reps Long Arc Quad: Strengthening;10 reps Heel Slides: Strengthening;10 reps Hip ABduction/ADduction: Strengthening;10 reps    General Comments        Pertinent Vitals/Pain Pain Assessment: No/denies pain    Home Living                      Prior Function            PT Goals (current goals can now be found in the care plan section) Acute Rehab PT Goals Patient Stated Goal: to breathe better  Progress towards PT goals: (medical symptoms reported to nursing)    Frequency    Min 3X/week      PT Plan Current plan remains appropriate    Co-evaluation              AM-PAC PT "6 Clicks" Mobility   Outcome  Measure  Help needed turning from your back to your side while in a flat bed without using bedrails?: None Help needed moving from lying on your back to sitting on the side of a flat bed without using bedrails?: None Help needed moving to and from a bed to a chair (including a wheelchair)?: A Little Help needed standing up from a chair using your arms (e.g., wheelchair or bedside chair)?: A Little Help needed to walk in hospital room?: A Little Help needed climbing 3-5 steps with a railing? : A Lot 6 Click Score: 19    End of Session Equipment Utilized During Treatment: Oxygen Activity Tolerance: Patient tolerated treatment well;Patient limited by fatigue Patient left: in bed;with call bell/phone within reach;with bed alarm set Nurse Communication: Mobility status PT Visit Diagnosis: Other abnormalities of gait and mobility (R26.89);Difficulty in walking, not elsewhere classified (R26.2)     Time: 7078-6754 PT Time Calculation (min) (ACUTE ONLY): 30 min  Charges:  $Therapeutic Exercise: 8-22 mins $Therapeutic Activity: 8-22  mins                   Ramond Dial 12/25/2018, 4:55 PM   Mee Hives, PT MS Acute Rehab Dept. Number: [place] and [place]

## 2018-12-25 NOTE — Plan of Care (Signed)
  Problem: Education: Goal: Knowledge of General Education information will improve Description: Including pain rating scale, medication(s)/side effects and non-pharmacologic comfort measures Outcome: Progressing   Problem: Clinical Measurements: Goal: Respiratory complications will improve Outcome: Progressing   Problem: Coping: Goal: Level of anxiety will decrease Outcome: Progressing   

## 2018-12-25 NOTE — TOC Initial Note (Signed)
Transition of Care [facility]) - Initial/Assessment Note    Patient Details  Name: Suzanne Dickson MRN: [id number] Date of Birth: December 14, 1945  [facility] CM/SW Contact:    Bartholomew Crews, RN Phone Number: [phone] 12/25/2018, 1:18 PM  Clinical Narrative:                 Acknowledging recommendations for [facility] PT. Noted previous arrangements for [facility] set up with [facility]. Verified active with [facility] for RN, PT, OT. Patient will need [place] orders for RN, PT, OT for resumption of services at discharge. TOC following for transition needs.    Expected Discharge Plan: [place] Barriers to Discharge: Continued Medical Work up   Patient Goals and CMS Choice        Expected Discharge Plan and Services Expected Discharge Plan: [place]   Discharge Planning Services: CM Consult Post Acute Care Choice: Resumption of Svcs/PTA Provider Living arrangements for the past 2 months: Single Family Home                           HH Arranged: RN, PT, OT [facility] Agency: [facility] Date [facility] Agency Contacted: 12/25/18 Time HH Agency Contacted: 38 Representative spoke with at [place] Arrangements/Services Living arrangements for the past 2 months: [place] with:: Self, Spouse              Current home services: Home OT, Home PT, Home RN, DME    Activities of Daily Living      Permission Sought/Granted                  Emotional Assessment              Admission diagnosis:  COPD exacerbation ([place]) [J44.1] Acute on chronic respiratory failure with hypoxia ([place]) [J96.21] Patient Active Problem List   Diagnosis Date Noted  . OSA on CPAP   . CAD (coronary artery disease)   . Anemia of chronic disease   . Pulmonary arterial hypertension ([place]) 11/25/2018  . COPD exacerbation ([place]) 11/24/2018  . Acute on chronic respiratory failure with hypoxia ([place]) 11/24/2018  . Leukocytosis  11/24/2018  . Iron deficiency anemia due to chronic blood loss 11/24/2018  . Prolonged Q-T interval on ECG 11/24/2018  . Unstable angina ([place]) 04/05/2017  . Adenocarcinoma of left lung, stage 1 ([place]) 04/15/2016  . Solitary pulmonary nodule on lung CT 11/20/2015  . Atherosclerosis of native arteries of extremity with intermittent claudication ([place]) 10/23/2015  . COPD GOLD III/ 02 dep  10/10/2014  . Chronic respiratory failure with hypoxia ([place]) 10/10/2014  . Acute bronchitis 09/02/2014  . Essential hypertension 06/07/2009  . RHINOSINUSITIS, CHRONIC 06/04/2009   PCP:  [name], MD Pharmacy:   [place], [place] [address] Phone: [phone] Fax: [phone]     Social Determinants of Health (SDOH) Interventions    Readmission Risk Interventions No flowsheet data found.

## 2018-12-26 DIAGNOSIS — I2510 Atherosclerotic heart disease of native coronary artery without angina pectoris: Secondary | ICD-10-CM

## 2018-12-26 LAB — CBC WITH DIFFERENTIAL/PLATELET
Abs Immature Granulocytes: 0.12 10*3/uL — ABNORMAL HIGH (ref 0.00–0.07)
Basophils Absolute: 0 10*3/uL (ref 0.0–0.1)
Basophils Relative: 0 %
Eosinophils Absolute: 0 10*3/uL (ref 0.0–0.5)
Eosinophils Relative: 0 %
HCT: 31.2 % — ABNORMAL LOW (ref 36.0–46.0)
Hemoglobin: 8.9 g/dL — ABNORMAL LOW (ref 12.0–15.0)
Immature Granulocytes: 1 %
Lymphocytes Relative: 11 %
Lymphs Abs: 1.1 10*3/uL (ref 0.7–4.0)
MCH: 25.6 pg — ABNORMAL LOW (ref 26.0–34.0)
MCHC: 28.5 g/dL — ABNORMAL LOW (ref 30.0–36.0)
MCV: 89.9 fL (ref 80.0–100.0)
Monocytes Absolute: 0.3 10*3/uL (ref 0.1–1.0)
Monocytes Relative: 3 %
Neutro Abs: 8.6 10*3/uL — ABNORMAL HIGH (ref 1.7–7.7)
Neutrophils Relative %: 85 %
Platelets: 262 10*3/uL (ref 150–400)
RBC: 3.47 MIL/uL — ABNORMAL LOW (ref 3.87–5.11)
RDW: 15.8 % — ABNORMAL HIGH (ref 11.5–15.5)
WBC: 10.2 10*3/uL (ref 4.0–10.5)
nRBC: 0 % (ref 0.0–0.2)

## 2018-12-26 LAB — BASIC METABOLIC PANEL
Anion gap: 9 (ref 5–15)
BUN: 28 mg/dL — ABNORMAL HIGH (ref 8–23)
CO2: 22 mmol/L (ref 22–32)
Calcium: 9.2 mg/dL (ref 8.9–10.3)
Chloride: 108 mmol/L (ref 98–111)
Creatinine, Ser: 1.01 mg/dL — ABNORMAL HIGH (ref 0.44–1.00)
GFR calc Af Amer: >60 mL/min (ref >60)
GFR calc non Af Amer: 55 mL/min — ABNORMAL LOW (ref >60)
Glucose, Bld: 130 mg/dL — ABNORMAL HIGH (ref 70–99)
Potassium: 4.5 mmol/L (ref 3.5–5.1)
Sodium: 139 mmol/L (ref 135–145)

## 2018-12-26 LAB — GLUCOSE, CAPILLARY
Glucose-Capillary: 128 mg/dL — ABNORMAL HIGH (ref 70–99)
Glucose-Capillary: 114 mg/dL — ABNORMAL HIGH (ref 70–99)

## 2018-12-26 MED ORDER — DOXYCYCLINE HYCLATE 100 MG PO TABS: 100.0000 mg | ORAL_TABLET | Freq: Two times a day (BID) | ORAL | 0 refills | Status: AC

## 2018-12-26 MED ORDER — INFLUENZA VAC A&B SA ADJ QUAD 0.5 ML IM PRSY
0.5000 mL | PREFILLED_SYRINGE | Freq: Once | INTRAMUSCULAR | Status: AC
  Administered 2018-12-26: 0.5 mL via INTRAMUSCULAR
  Filled 2018-12-26: qty 0.5

## 2018-12-26 MED ORDER — INFLUENZA VAC A&B SA ADJ QUAD 0.5 ML IM PRSY: 0.5000 mL | PREFILLED_SYRINGE | INTRAMUSCULAR | Status: DC

## 2018-12-26 MED ORDER — METHYLPREDNISOLONE 4 MG PO TBPK: ORAL_TABLET | ORAL | 0 refills | Status: DC

## 2018-12-26 NOTE — Discharge Summary (Signed)
Physician Discharge Summary  Suzanne Dickson BZJ:[id number] DOB: [birth year] DOA: 12/21/2018  PCP: [name], MD  Admit date: 12/21/2018 Discharge date: 12/26/2018  Admitted From: Inpatient Disposition: home  Recommendations for Outpatient Follow-up:  1. Follow up with PCP in 1-2 weeks :  Home Health:No Equipment/Devices:no new equipment  Discharge Condition:Stable CODE STATUS:Full code Diet recommendation: Regular healthy diet  Brief/Interim Summary: Suzanne Dickson is an 73 y.o. 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  In the ED she was noted to be satting at 96% on 6 L, with a white count of 13 SARS-CoV-2 test negative, chest x-ray showed postoperative changes.  Hospital course: Acute on chronic respiratory failure with hypoxia due to COPD exacerbation Hyperglycemia Asymmetric lower extremity edema CAD status post DES Hypertension Anemia of chronic disease Depression with anxiety OSA  Patient was admitted placed on steroids nebs supplemental O2 to maintain sats around 90 to 92%.  At baseline patient is on 4 L at home and requiring 6 to 7 L.  She was weaned down to her home oxygen maintain sats and feels that she is at baseline.  Patient's will continue a steroid taper complete antibiotics continue home medications without change. Hyperglycemia with an A1c of 5.4 this is  consistent with hyperglycemia secondary to steroids.  Patient will follow up with PCP for continued outpatient surveillance.  Anticipate this will improve with completion of taper. Asymmetric lower extremity edema.  Patient bilateral lower extremity Dopplers were negative for DVT I suspect this is mild lymphedema.  No evidence of infection, no evidence of heart failure. CAD status post DES.  Patient is on aspirin Toprol and Plavix which we will continue.  She will follow with her PCP for continued management of lipids. Hypertension medications as above   Discharge Diagnoses:  Principal Problem:   Acute on chronic respiratory failure with hypoxia (HCC) Active Problems:   Essential hypertension   Chronic respiratory failure with hypoxia (HCC)   COPD exacerbation (HCC)   OSA on CPAP   CAD (coronary artery disease)   Anemia of chronic disease    Discharge Instructions  Discharge Instructions    Call MD for:  difficulty breathing, headache or visual disturbances   Complete by: As directed    Call MD for:  extreme fatigue   Complete by: As directed    Call MD for:  hives   Complete by: As directed    Call MD for:  persistant dizziness or light-headedness   Complete by: As directed    Call MD for:  persistant nausea and vomiting   Complete by: As directed    Call MD for:  severe uncontrolled pain   Complete by: As directed    Call MD for:  temperature >100.4   Complete by: As directed    Diet - low sodium heart healthy   Complete by: As directed    Increase activity slowly  Complete by: As directed      Allergies as of 12/26/2018   No Known Allergies     Medication List    TAKE these medications   albuterol (2.5 MG/3ML) 0.083% nebulizer solution Commonly known as: PROVENTIL Take 3 mLs (2.5 mg total) by nebulization every 4 (four) hours as needed. For shortness of breath What changed:   reasons to take this  additional instructions   albuterol 108 (90 Base) MCG/ACT  inhaler Commonly known as: VENTOLIN HFA Inhale 2 puffs into the lungs every 4 (four) hours as needed for wheezing or shortness of breath. What changed: Another medication with the same name was changed. Make sure you understand how and when to take each.   amLODipine 5 MG tablet Commonly known as: NORVASC Take 1 tablet (5 mg total) by mouth daily. What changed:   how much to take  when to take this   aspirin EC 81 MG tablet Take 81 mg by mouth every morning.   cetirizine 10 MG tablet Commonly known as: ZYRTEC Take 10 mg by mouth at bedtime.   clopidogrel 75 MG tablet Commonly known as: PLAVIX Take 75 mg by mouth every morning.   doxycycline 100 MG tablet Commonly known as: VIBRA-TABS Take 1 tablet (100 mg total) by mouth every 12 (twelve) hours for 3 days.   ferrous sulfate 325 (65 FE) MG EC tablet Take 325 mg by mouth 3 (three) times daily with meals.   Fluticasone-Umeclidin-Vilant 100-62.5-25 MCG/INH Aepb Commonly known as: Trelegy Ellipta Inhale 1 puff into the lungs daily. What changed: when to take this   guaiFENesin 600 MG 12 hr tablet Commonly known as: MUCINEX Take 600 mg by mouth every 12 (twelve) hours.   ibuprofen 200 MG tablet Commonly known as: ADVIL Take 400 mg by mouth every 6 (six) hours as needed (pain).   loratadine 10 MG tablet Commonly known as: CLARITIN Take 10 mg by mouth daily.   losartan 50 MG tablet Commonly known as: COZAAR Take 50 mg by mouth every morning.   methylPREDNISolone 4 MG Tbpk tablet Commonly known as: MEDROL DOSEPAK Per package insert   metoprolol succinate 25 MG 24 hr tablet Commonly known as: TOPROL-XL Take 25 mg by mouth every morning.   nitroGLYCERIN 0.4 MG SL tablet Commonly known as: NITROSTAT Place 0.4 mg under the tongue every 5 (five) minutes as needed for chest pain.   nitroGLYCERIN 0.4 mg/hr patch Commonly known as: NITRODUR - Dosed in mg/24 hr Place 0.4 mg onto the skin every morning.   OVER THE  COUNTER MEDICATION Take 1 tablet by mouth at bedtime. OTC sleep aid - unknown ingredients   OXYGEN Inhale 2-4 L/min into the lungs See admin instructions. 2-3 lpm at rest and 4 lpm with exertion   pantoprazole 40 MG tablet Commonly known as: PROTONIX Take 30-60 min before first meal of the day What changed:   how much to take  how to take this  when to take this  additional instructions   REFRESH OP Place 1 drop into both eyes daily as needed (dry eyes).   sertraline 100 MG tablet Commonly known as: ZOLOFT Take 100 mg by mouth 2 (two) times daily.      Follow-up Information    [facility] Follow up.   Specialty: Home Health Services Contact information: [place] [address] [phone]          No Known Allergies  Consultations:  None   Procedures/Studies: X-ray Chest  Pa And Lateral  Result Date: 12/22/2018 CLINICAL DATA:  Shortness of breath EXAM: CHEST - 2 VIEW COMPARISON:  Radiograph 12/21/2018, CT 11/24/2018 FINDINGS: Stable bandlike region of scarring and postsurgical change in the left upper lobe. Persistent left basilar opacity favoring atelectasis or some mild edema. Cardiomediastinal contours are stable from prior with a calcified aorta. No pneumothorax. No visible effusion. Osseous structures are unremarkable. Cardiac monitoring leads overlie the chest. IMPRESSION: 1. Stable bandlike region of scarring and postsurgical change in the left upper lobe. 2. Persistent left basilar opacity favoring atelectasis or mild asymmetric edema. Electronically Signed   By: Lovena Le M.D.   On: 12/22/2018 05:26   Dg Chest Portable 1 View  Result Date: 12/21/2018 CLINICAL DATA:  Shortness of breath EXAM: PORTABLE CHEST 1 VIEW COMPARISON:  Radiograph and CT 11/24/2018 FINDINGS: Postsurgical change in the left mid lung with stable bandlike region of scarring. Question some developing patchy opacity in the left infrahilar lung slightly  increased from comparison study. No pneumothorax. No visible effusion. The aorta is calcified. The remaining cardiomediastinal contours are unremarkable. No acute osseous or soft tissue abnormality. IMPRESSION: 1. Question developing patchy opacity in the left infrahilar lung. Could reflect atelectasis or developing infection. 2. Stable postoperative changes in the left mid lung with stable bandlike region of scarring. 3.  Aortic Atherosclerosis (ICD10-I70.0). Electronically Signed   By: Lovena Le M.D.   On: 12/21/2018 22:17   Vas [place] Lower Extremity Venous (dvt)  Result Date: 12/23/2018  Lower Venous Study Indications: Edema.  Anticoagulation: Lovenox. Performing Technologist: Darlina Sicilian RDCS  Examination Guidelines: A complete evaluation includes B-mode imaging, spectral Doppler, color Doppler, and power Doppler as needed of all accessible portions of each vessel. Bilateral testing is considered an integral part of a complete examination. Limited examinations for reoccurring indications may be performed as noted.  +---------+---------------+---------+-----------+----------+--------------+ RIGHT    CompressibilityPhasicitySpontaneityPropertiesThrombus Aging +---------+---------------+---------+-----------+----------+--------------+ CFV      Full           Yes      Yes                                 +---------+---------------+---------+-----------+----------+--------------+ FV Prox  Full                                                        +---------+---------------+---------+-----------+----------+--------------+ FV Mid   Full                                                        +---------+---------------+---------+-----------+----------+--------------+ FV DistalFull                                                        +---------+---------------+---------+-----------+----------+--------------+ POP      Full           Yes      Yes                                  +---------+---------------+---------+-----------+----------+--------------+  PTV      Full                                                        +---------+---------------+---------+-----------+----------+--------------+ PERO     Full                                                        +---------+---------------+---------+-----------+----------+--------------+   +----+---------------+---------+-----------+----------+--------------+ LEFTCompressibilityPhasicitySpontaneityPropertiesThrombus Aging +----+---------------+---------+-----------+----------+--------------+ CFV Full           Yes      Yes                                 +----+---------------+---------+-----------+----------+--------------+     Summary: Right: No evidence of deep vein thrombosis in the lower extremity. No indirect evidence of obstruction proximal to the inguinal ligament. No cystic structure found in the popliteal fossa. Left: No evidence of common femoral vein obstruction.  *See table(s) above for measurements and observations. Electronically signed by Curt Jews MD on 12/23/2018 at 9:28:17 AM.    Final        Subjective: Patient reports he feels at her baseline stable for discharge.  Discharge Exam: Vitals:   12/26/18 0758 12/26/18 0837  BP: 130/63 130/64  Pulse: 80 82  Resp: 20 18  Temp: 98.3 F (36.8 C)   SpO2: 100% 100%   Vitals:   12/25/18 2025 12/26/18 0446 12/26/18 0758 12/26/18 0837  BP:  (!) 160/79 130/63 130/64  Pulse: 89 68 80 82  Resp: 20 20 20 18   Temp:  98.4 F (36.9 C) 98.3 F (36.8 C)   TempSrc:   Oral   SpO2: 92% 97% 100% 100%  Weight:      Height:        General: Pt is alert, awake, not in acute distress Cardiovascular: RRR, S1/S2 +, no rubs, no gallops Respiratory: CTA bilaterally, no wheezing, no rhonchi Abdominal: Soft, NT, ND, bowel sounds + Extremities: no edema, no cyanosis    The results of significant diagnostics from this hospitalization  (including imaging, microbiology, ancillary and laboratory) are listed below for reference.     Microbiology: Recent Results (from the past 240 hour(s))  SARS CORONAVIRUS 2 (TAT 6-24 HRS) Nasopharyngeal Nasopharyngeal Swab     Status: None   Collection Time: 12/21/18 10:09 PM   Specimen: Nasopharyngeal Swab  Result Value Ref Range Status   SARS Coronavirus 2 NEGATIVE NEGATIVE Final    Comment: (NOTE) SARS-CoV-2 target nucleic acids are NOT DETECTED. The SARS-CoV-2 RNA is generally detectable in upper and lower respiratory specimens during the acute phase of infection. Negative results do not preclude SARS-CoV-2 infection, do not rule out co-infections with other pathogens, and should not be used as the sole basis for treatment or other patient management decisions. Negative results must be combined with clinical observations, patient history, and epidemiological information. The expected result is Negative. Fact Sheet for Patients: SugarRoll.be Fact Sheet for Healthcare Providers: https://www.woods-mathews.com/ This test is not yet approved or cleared by the [place] FDA and  has been authorized for detection and/or diagnosis of SARS-CoV-2 by FDA under  an Emergency Use Authorization (EUA). This EUA will remain  in effect (meaning this test can be used) for the duration of the COVID-19 declaration under Section 56 4(b)(1) of the Act, 21 U.S.C. section 360bbb-3(b)(1), unless the authorization is terminated or revoked sooner. Performed at [facility], [place] [address], [place], [place] [address]   Respiratory Panel by PCR     Status: None   Collection Time: 12/22/18  8:42 AM   Specimen: Nasopharyngeal Swab; Respiratory  Result Value Ref Range Status   Adenovirus NOT DETECTED NOT DETECTED Final   Coronavirus 229E NOT DETECTED NOT DETECTED Final    Comment: (NOTE) The Coronavirus on the Respiratory Panel, DOES NOT test for the  novel  Coronavirus (2019 nCoV)    Coronavirus HKU1 NOT DETECTED NOT DETECTED Final   Coronavirus NL63 NOT DETECTED NOT DETECTED Final   Coronavirus OC43 NOT DETECTED NOT DETECTED Final   Metapneumovirus NOT DETECTED NOT DETECTED Final   Rhinovirus / Enterovirus NOT DETECTED NOT DETECTED Final   Influenza A NOT DETECTED NOT DETECTED Final   Influenza B NOT DETECTED NOT DETECTED Final   Parainfluenza Virus 1 NOT DETECTED NOT DETECTED Final   Parainfluenza Virus 2 NOT DETECTED NOT DETECTED Final   Parainfluenza Virus 3 NOT DETECTED NOT DETECTED Final   Parainfluenza Virus 4 NOT DETECTED NOT DETECTED Final   Respiratory Syncytial Virus NOT DETECTED NOT DETECTED Final   Bordetella pertussis NOT DETECTED NOT DETECTED Final   Chlamydophila pneumoniae NOT DETECTED NOT DETECTED Final   Mycoplasma pneumoniae NOT DETECTED NOT DETECTED Final    Comment: Performed at [facility], [place]. [address] [place], [place] [address]     Labs: BNP (last 3 results) Recent Labs    12/21/18 2200  BNP 540.0*   Basic Metabolic Panel: Recent Labs  Lab 12/22/18 0621 12/23/18 0506 12/24/18 0559 12/25/18 0404 12/26/18 0540  NA 141 142 141 142 139  K 4.6 4.4 4.4 4.5 4.5  CL 105 108 107 107 108  CO2 20* 26 22 26 22   GLUCOSE 213* 149* 134* 107* 130*  BUN 20 25* 29* 30* 28*  CREATININE 1.02* 0.98 1.02* 1.11* 1.01*  CALCIUM 9.2 9.4 9.4 9.3 9.2   Liver Function Tests: Recent Labs  Lab 12/21/18 2200  AST 28  ALT 22  ALKPHOS 59  BILITOT 0.7  PROT 6.9  ALBUMIN 3.8   No results for input(s): LIPASE, AMYLASE in the last 168 hours. No results for input(s): AMMONIA in the last 168 hours. CBC: Recent Labs  Lab 12/21/18 2200 12/22/18 0621 12/23/18 0506 12/24/18 0559 12/25/18 0404 12/26/18 0540  WBC 13.7* 8.1 11.9* 12.0* 12.0* 10.2  NEUTROABS 9.3*  --  10.6* 10.8* 9.1* 8.6*  HGB 8.6* 8.4* 8.0* 8.5* 8.7* 8.9*  HCT 30.4* 30.0* 27.7* 29.9* 30.2* 31.2*  MCV 92.1 89.6 89.9 89.8 90.4 89.9  PLT  284 224 225 244 228 262   Cardiac Enzymes: No results for input(s): CKTOTAL, CKMB, CKMBINDEX, TROPONINI in the last 168 hours. BNP: Invalid input(s): POCBNP CBG: Recent Labs  Lab 12/25/18 1133 12/25/18 1641 12/25/18 2021 12/26/18 0712 12/26/18 1117  GLUCAP 97 209* 275* 128* 114*   D-Dimer No results for input(s): DDIMER in the last 72 hours. Hgb A1c No results for input(s): HGBA1C in the last 72 hours. Lipid Profile No results for input(s): CHOL, HDL, LDLCALC, TRIG, CHOLHDL, LDLDIRECT in the last 72 hours. Thyroid function studies No results for input(s): TSH, T4TOTAL, T3FREE, THYROIDAB in the last 72  hours.  Invalid input(s): FREET3 Anemia work up No results for input(s): VITAMINB12, FOLATE, FERRITIN, TIBC, IRON, RETICCTPCT in the last 72 hours. Urinalysis    Component Value Date/Time   COLORURINE YELLOW 09/02/2014 2138   APPEARANCEUR CLEAR 09/02/2014 2138   LABSPEC 1.019 09/02/2014 2138   PHURINE 5.0 09/02/2014 2138   GLUCOSEU NEGATIVE 09/02/2014 2138   HGBUR NEGATIVE 09/02/2014 2138   BILIRUBINUR NEGATIVE 09/02/2014 2138   KETONESUR NEGATIVE 09/02/2014 2138   PROTEINUR NEGATIVE 09/02/2014 2138   UROBILINOGEN 0.2 09/02/2014 2138   NITRITE NEGATIVE 09/02/2014 2138   LEUKOCYTESUR NEGATIVE 09/02/2014 2138   Sepsis Labs Invalid input(s): PROCALCITONIN,  WBC,  LACTICIDVEN Microbiology Recent Results (from the past 240 hour(s))  SARS CORONAVIRUS 2 (TAT 6-24 HRS) Nasopharyngeal Nasopharyngeal Swab     Status: None   Collection Time: 12/21/18 10:09 PM   Specimen: Nasopharyngeal Swab  Result Value Ref Range Status   SARS Coronavirus 2 NEGATIVE NEGATIVE Final    Comment: (NOTE) SARS-CoV-2 target nucleic acids are NOT DETECTED. The SARS-CoV-2 RNA is generally detectable in upper and lower respiratory specimens during the acute phase of infection. Negative results do not preclude SARS-CoV-2 infection, do not rule out co-infections with other pathogens, and should not  be used as the sole basis for treatment or other patient management decisions. Negative results must be combined with clinical observations, patient history, and epidemiological information. The expected result is Negative. Fact Sheet for Patients: SugarRoll.be Fact Sheet for Healthcare Providers: https://www.woods-mathews.com/ This test is not yet approved or cleared by the [place] FDA and  has been authorized for detection and/or diagnosis of SARS-CoV-2 by FDA under an Emergency Use Authorization (EUA). This EUA will remain  in effect (meaning this test can be used) for the duration of the COVID-19 declaration under Section 56 4(b)(1) of the Act, 21 U.S.C. section 360bbb-3(b)(1), unless the authorization is terminated or revoked sooner. Performed at [facility], [place] [address] [place], [place] [address]   Respiratory Panel by PCR     Status: None   Collection Time: 12/22/18  8:42 AM   Specimen: Nasopharyngeal Swab; Respiratory  Result Value Ref Range Status   Adenovirus NOT DETECTED NOT DETECTED Final   Coronavirus 229E NOT DETECTED NOT DETECTED Final    Comment: (NOTE) The Coronavirus on the Respiratory Panel, DOES NOT test for the novel  Coronavirus (2019 nCoV)    Coronavirus HKU1 NOT DETECTED NOT DETECTED Final   Coronavirus NL63 NOT DETECTED NOT DETECTED Final   Coronavirus OC43 NOT DETECTED NOT DETECTED Final   Metapneumovirus NOT DETECTED NOT DETECTED Final   Rhinovirus / Enterovirus NOT DETECTED NOT DETECTED Final   Influenza A NOT DETECTED NOT DETECTED Final   Influenza B NOT DETECTED NOT DETECTED Final   Parainfluenza Virus 1 NOT DETECTED NOT DETECTED Final   Parainfluenza Virus 2 NOT DETECTED NOT DETECTED Final   Parainfluenza Virus 3 NOT DETECTED NOT DETECTED Final   Parainfluenza Virus 4 NOT DETECTED NOT DETECTED Final   Respiratory Syncytial Virus NOT DETECTED NOT DETECTED Final   Bordetella pertussis NOT  DETECTED NOT DETECTED Final   Chlamydophila pneumoniae NOT DETECTED NOT DETECTED Final   Mycoplasma pneumoniae NOT DETECTED NOT DETECTED Final    Comment: Performed at [facility], [place]. [address], [place], [place] [address]     Time coordinating discharge: Over 30 minutes  SIGNED:   Nicolette Bang, MD  [facility] 12/26/2018, 12:57 PM Pager   If 7PM-7AM, please contact night-coverage [email or web address] Password  [email or web address]

## 2018-12-26 NOTE — Plan of Care (Signed)
  Problem: Pain Managment: Goal: General experience of comfort will improve Outcome: Progressing   

## 2018-12-26 NOTE — Progress Notes (Signed)
Suzanne Dickson to be discharged Home  per MD order. Discussed prescriptions and follow up appointments with the patient. Prescriptions given to patient; medication list explained in detail. Patient verbalized understanding.  Skin clean, dry and intact without evidence of skin break down, no evidence of skin tears noted. IV catheter discontinued intact. Site without signs and symptoms of complications. Dressing and pressure applied. Pt denies pain at the site currently. No complaints noted.  Patient free of lines, drains, and wounds.   An After Visit Summary (AVS) was printed and given to the patient. Patient escorted via wheelchair, and discharged home via private auto.  Shela Commons, RN

## 2019-01-09 ENCOUNTER — Ambulatory Visit: Payer: Medicare Other | Admitting: Internal Medicine

## 2019-01-12 ENCOUNTER — Ambulatory Visit: Payer: Medicare Other | Admitting: Internal Medicine

## 2019-01-15 ENCOUNTER — Ambulatory Visit (INDEPENDENT_AMBULATORY_CARE_PROVIDER_SITE_OTHER): Payer: Medicare Other | Admitting: Internal Medicine

## 2019-01-15 ENCOUNTER — Other Ambulatory Visit: Payer: Self-pay

## 2019-01-15 DIAGNOSIS — J449 Chronic obstructive pulmonary disease, unspecified: Secondary | ICD-10-CM

## 2019-01-15 DIAGNOSIS — J9611 Chronic respiratory failure with hypoxia: Secondary | ICD-10-CM | POA: Diagnosis not present

## 2019-01-15 MED ORDER — PREDNISONE 10 MG PO TABS: ORAL_TABLET | ORAL | 2 refills | Status: DC

## 2019-01-15 NOTE — Progress Notes (Signed)
Subjective:     Patient ID: Suzanne Dickson, female   DOB: 1945/02/05     MRN: [id number]    Brief patient profile:  [id number] quit smoking on admit 09/02/14  and proved to have GOLD III copd 11/2014     History of Present Illness  09/22/2015  f/u ov/Shacoria Latif re: copd III/ symb 1602bid on 2lpm at rest/ 4lpm ex  Chief Complaint  Patient presents with  . Follow-up    [place] had hemoptysis and increased SOB. She took round pred and augmentin and symptoms are some better. She has noticed minimal wheezing at night. She has used neb 3 x over the past 2 wks.   liked bevespi better but wasn't covered then and not doing as well on the symbicort though hfa not ideal  - see a/p rec Plan A = Automatic =stop symbicort and restart Bevespi Take 2 puffs first thing in am and then another 2 puffs about 12 hours later.  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 Acute Visit    Breathing has been worse x 2 wks. She has been producing some yellow sputum over the past 2 days. She states also wheezing. She has been using her albuterol inhaler 3 x per day and she has not used neb.   Dyspnea:  MMRC3 = baseline can't walk 100 yards even at a slow pace at a flat grade s stopping due to sob on 2lpm but sometimes on RA now worse 2 weeks assoc with watery rhinitis no better with zyrtec  Cough:  more productive x 2 weeks assoc with pnds but min yellow  Sleeping: 2lpm  Bed flat and 2 big pillows  SABA use: not usually needing but now tid  02: 2lpm baseline now up to 3lpm  rec Prednisone 10 mg take  4 each am x 2 days,   2 each am x 2 days,  1 each am x 2 days and stop  Plan A = Automatic = symbicort 160 Take 2 puffs first thing in am and then another 2 puffs about 12 hours later.  Plan B = Backup Only use your albuterol as a rescue medication Plan C = Crisis - only use your albuterol nebulizer if you first try Plan B and it fails to help > ok to use the nebulizer up to every 4 hours but if start needing it regularly call for immediate appointment Goal for 02 is to keep saturation over 90%  Please schedule a follow up office visit in 4 weeks, sooner if needed  with all medications /inhalers/ solutions in hand so we can verify exactly what you are taking. This includes all medications from all doctors and over the counters    11/16/2017  f/u ov/Jaquane Boughner  re: copd III criteria but 02 dep/ did not bring all meds as req  Chief Complaint  Patient presents with  . Follow-up    Breathing better but back quite back to baseline. She still has rhinitis and PND. She is using her albuterol inhaler 8-10 x per wk.  She rarely uses her neb.   Dyspnea: .MMRC3 = can't walk 100 yards even at a slow pace at a flat grade s stopping due to sob  On RA or  3lpm  - not monitoring sats as rec  Cough: none  Sleeping: ok 1pillow / bed flat SABA use: as above  02: 2lpm hs / prn daytime  rec Prednisone 10 mg take  4 each am x 2 days,   2 each am x 2 days,  1 each am x 2 days and stop  - add  No 02 needed at rest, max 02 when walking more than room to room at home to prevent desats ENT eval  > [facility] saw 12/07/17 dx ? 02 irritation       02/17/2018  f/u ov/Braxtyn Bojarski re: gold III criteria but 02 dep, doing better with nasal symptoms on humidified 02  Chief Complaint  Patient presents with  . Follow-up    Breathing  is some better. She has some nasal congestion in the am- clear, bloody nasal d/c. She is using her proair 2-3 x per wk on average.   Dyspnea:  Better vs priors/ pred helped nasal symptoms a lot and also doe  Cough: no Sleeping: 2 pillows/ bed blocks on order  SABA use: a little more since stopped prednisone  02: 2lpm 24/7 but up to 4 active rec Plan A = Automatic = Trelegy one click first thing each am Plan B = Backup Only use your albuterol as a rescue medication Plan C = Crisis - only use your albuterol nebulizer if you first try Plan B and it fails to help > ok to use the nebulizer up to every 4 hours but if start needing it regularly call for immediate appointmentPlan D = Deltasone= Prednisone - if needing the nebulizer more than twice daily then start prednisone : Prednisone 10 mg take  4 each am x 2 days,   2 each am x 2 days,  1 each am x 2 days and stop     Virtual Visit via Telephone Note 10/06/2018  GOLD II/ 02 dep 2lpm /4lpm walking maint trelegy and pred as plan D but not understanding when to activate  History of Present Illness: Dyspnea:  At her best can walk inside house with one flight of steps then once stops pred worse over the next week or two and increase need for neb  Despite maint trelegy  Cough: none at all now Sleeping: ok on wedge  SABA use: using first thing in am / no pred in sev months 02: 2lpm hs / 2 resting and 4lpm with activity  rec Remember prednisone is refillable and can be used when you are losing ground with your breathing and using more of your nebulizer than usual as Plan D below Plan A = Automatic = Trelegy one click first thing each am Plan B = Backup Only use your albuterol as a rescue medication Plan C = Crisis - only use your albuterol nebulizer if you first try Plan B and it fails to help > ok to use the nebulizer up to every 4 hours but if start needing it regularly call for immediate appointment  Plan D = Deltasone= Prednisone - if needing  the nebulizer more than twice daily then start prednisone : Prednisone 10 mg take  4 each am x 2 days,   2 each am x 2 days,  1 each am x 2 days and stop  Please schedule a follow up visit in 3 months but call sooner if needed  with all medications /inhalers/ solutions in hand so we can verify exactly what you are taking. 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 In the ED she was noted to be satting at 96% on 6 L, with a white count of 13 SARS-CoV-2 test negative, chest x-ray showed postoperative changes.  Hospital course: Acute on chronic respiratory failure with hypoxia due to COPD exacerbation Hyperglycemia Asymmetric lower extremity edema CAD status post DES Hypertension Anemia of chronic disease Depression with anxiety OSA  Patient was admitted placed on steroids nebs supplemental O2 to maintain sats around 90 to 92%.  At baseline patient is on 4 L at home and requiring 6 to 7 L.  She was weaned down to her home oxygen maintain sats and feels that she is at baseline.  Patient's will continue a steroid taper complete antibiotics continue home medications without change. Hyperglycemia with an A1c of 5.4 this is  consistent with hyperglycemia secondary to steroids.  Patient will follow up with PCP for continued outpatient surveillance.  Anticipate this will improve with completion of taper. Asymmetric lower extremity edema.  Patient bilateral lower extremity Dopplers were negative for DVT I suspect this is mild lymphedema.  No evidence of infection, no evidence of heart failure. CAD status post DES.  Patient is on aspirin Toprol and Plavix which we will continue.  She will follow with her PCP for continued management of lipids. Hypertension medications as above   Discharge Diagnoses:    Acute on chronic respiratory failure with hypoxia (HCC)   Essential hypertension   Chronic respiratory failure with hypoxia (HCC)   COPD exacerbation (HCC)   OSA on CPAP   CAD (coronary artery disease)   Anemia of chronic disease    Virtual Visit via Telephone Note 01/15/2019   I connected with Adiba G Puller on 01/19/19 at  4:45 PM EST by telephone and verified that I am speaking with the correct person using two identifiers.   I discussed the limitations, risks, security and privacy concerns of performing an evaluation and management service by telephone and the availability of in person appointments. I also discussed with the patient that there may be a patient responsible charge related to this service. The patient expressed understanding and agreed to proceed.   History of Present Illness: maint trelegy and prednisone weaning down to 20 mg  F/u Chelsy Parrales re: p hosp for aecopd  Dyspnea:  Better p last admit  Cough: not much at all  Sleeping: 45 degrees wedge SABA use: not at all  02: 2 lpm 24h per day     No obvious day to day or daytime variability or assoc excess/ purulent sputum or mucus plugs or hemoptysis or cp or chest tightness, subjective wheeze or overt sinus or hb symptoms.    Also denies any obvious fluctuation of symptoms with weather or environmental changes or other aggravating or alleviating  factors except as outlined above.   Meds reviewed/ med reconciliation completed         Observations/Objective: No conversational sob/ minimal rattling on voluntary cough/ nl voice texture    Assessment and Plan: See problem list for active a/p's   Follow Up Instructions: See avs for instructions unique to this ov which includes revised/ updated med list     I discussed the assessment and treatment plan with the patient. The patient was provided an opportunity to ask questions and all were answered. The patient agreed with the plan and demonstrated an understanding of the instructions.   The patient was advised to call back or seek an in-person evaluation if the symptoms worsen or if the condition fails to improve as anticipated.  I provided 22  minutes of non-face-to-face time during this encounter.   Christinia Gully, MD

## 2019-01-15 NOTE — Patient Instructions (Addendum)
Prednisone 10 mg remain on  2 daily until  feeling better then 10 mg one daily thru the holidays   Make sure you check your oxygen saturations at highest level of activity to be sure it stays over 90% and adjust upward to maintain this level if needed but remember to turn it back to previous settings when you stop (to conserve your supply).   Please schedule a follow up office visit in 4 weeks, sooner if needed  with all medications /inhalers/ solutions in hand so we can verify exactly what you are taking. This includes all medications from all doctors and over the counters    .

## 2019-01-19 NOTE — Assessment & Plan Note (Signed)
Started on 02 2lpm at d/c 09/04/14  - 10/28/2014   Walked RA x one lap @ 185 stopped due to  desat to 82%  at nl pace so rec 02 2lpm with walking and sleeping  - 12/09/2014 referred for POC > did not qualify - 08/17/2016 Patient Saturations on Room Air at Rest = 85%---increased to 93% on 2lpm o2 - 04/18/2017  Walking across exam room sats dropped into 80's on 2lpm  - 11/16/2017   Walked RA x one lap @ 185 stopped due to 87% and even on 5lpm could not maintain sats at nl pace  -  01/11/2018 humidfy 02  Added > not using as of 05/08/2018 > rec she do so > improved on humidity  As of  01/15/2019   rx  2lpm hs, 2lpm resting, and up to 4 lpm with activity or whatever is needed to keep sats > 90% with activity depending on intensity    Each maintenance medication was reviewed in detail including most importantly the difference between maintenance and as needed and under what circumstances the prns are to be used.  Please see AVS for specific  Instructions which are unique to this visit and I personally typed out  which were reviewed in detail in writing with the patient and a copy provided via MyChart

## 2019-01-19 NOTE — Assessment & Plan Note (Signed)
Quit smoking 08/2014 - PFT's 12/09/14   FEV1 0.75 (44 % ) ratio 62  p no % improvement from saba p ? prior to study with DLCO  24 % corrects to 48 % for alv volume    - 12/09/2014 referred to rehab > started 12/2014  - 04/25/2015   try BEVESPI  - 06/20/2015  Changed by insurance> back on symb 160  - 09/22/2015 not happy with symbicort > changed back to besvespi 2bid > not happy with bevespi > changed back to symb 160 2bid and consider adding spiriva next vs trelegy trial   - 02/17/2018  After extensive coaching inhaler device,  effectiveness =    50% with hfa and 90% with elipta so rec changed to trelegy   - 05/08/2018 added pred x 6 days as "plan D"  - 01/15/2019 changed pred to 20 mg until better then 10 mg daily    Group D in terms of symptom/risk and laba/lama/ICS  therefore appropriate rx at this point >>>  Continue trelegy plus for now daily prednisone as each time she tapers she flares  The goal with a chronic steroid dependent illness is always arriving at the lowest effective dose that controls the disease/symptoms and not accepting a set "formula" which is based on statistics or guidelines that don't always take into account patient  variability or the natural hx of the dz in every individual patient, which may well vary over time.  For now therefore I recommend the patient maintain  20 mg ceiling and 10 mg floor then return to office in 4 weeks with all meds in hand using a trust but verify approach to confirm accurate Medication  Reconciliation The principal here is that until we are certain that the  patients are doing what we've asked, it makes no sense to ask them to do more.

## 2019-02-09 ENCOUNTER — Ambulatory Visit: Payer: Medicare Other | Admitting: Internal Medicine

## 2019-02-12 ENCOUNTER — Other Ambulatory Visit: Payer: Self-pay

## 2019-02-12 ENCOUNTER — Ambulatory Visit (INDEPENDENT_AMBULATORY_CARE_PROVIDER_SITE_OTHER): Payer: Medicare PPO | Admitting: Internal Medicine

## 2019-02-12 DIAGNOSIS — J449 Chronic obstructive pulmonary disease, unspecified: Secondary | ICD-10-CM

## 2019-02-12 DIAGNOSIS — J328 Other chronic sinusitis: Secondary | ICD-10-CM

## 2019-02-12 DIAGNOSIS — J9611 Chronic respiratory failure with hypoxia: Secondary | ICD-10-CM | POA: Diagnosis not present

## 2019-02-12 MED ORDER — AZELASTINE-FLUTICASONE 137-50 MCG/ACT NA SUSP: NASAL | 11 refills | Status: DC

## 2019-02-12 MED ORDER — FAMOTIDINE 20 MG PO TABS: ORAL_TABLET | ORAL | 11 refills | Status: DC

## 2019-02-12 NOTE — Progress Notes (Signed)
Subjective:     Patient ID: Suzanne Dickson, female   DOB: 1945/06/04     MRN: [id number]    Brief patient profile:  [id number] quit smoking on admit 09/02/14  and proved to have GOLD III copd 11/2014     History of Present Illness  09/22/2015  f/u ov/Suzanne Dickson re: copd III/ symb 1602bid on 2lpm at rest/ 4lpm ex  Chief Complaint  Patient presents with  . Follow-up    [place] had hemoptysis and increased SOB. She took round pred and augmentin and symptoms are some better. She has noticed minimal wheezing at night. She has used neb 3 x over the past 2 wks.   liked bevespi better but wasn't covered then and not doing as well on the symbicort though hfa not ideal  - see a/p rec Plan A = Automatic =stop symbicort and restart Bevespi Take 2 puffs first thing in am and then another 2 puffs about 12 hours later.  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 Acute Visit    Breathing has been worse x 2 wks. She has been producing some yellow sputum over the past 2 days. She states also wheezing. She has been using her albuterol inhaler 3 x per day and she has not used neb.   Dyspnea:  MMRC3 = baseline can't walk 100 yards even at a slow pace at a flat grade s stopping due to sob on 2lpm but sometimes on RA now worse 2 weeks assoc with watery rhinitis no better with zyrtec  Cough:  more productive x 2 weeks assoc with pnds but min yellow  Sleeping: 2lpm  Bed flat and 2 big pillows  SABA use: not usually needing but now tid  02: 2lpm baseline now up to 3lpm  rec Prednisone 10 mg take  4 each am x 2 days,   2 each am x 2 days,  1 each am x 2 days and stop  Plan A = Automatic = symbicort 160 Take 2 puffs first thing in am and then another 2 puffs about 12 hours later.  Plan B = Backup Only use your albuterol as a rescue medication Plan C = Crisis - only use your albuterol nebulizer if you first try Plan B and it fails to help > ok to use the nebulizer up to every 4 hours but if start needing it regularly call for immediate appointment Goal for 02 is to keep saturation over 90%  Please schedule a follow up office visit in 4 weeks, sooner if needed  with all medications /inhalers/ solutions in hand so we can verify exactly what you are taking. This includes all medications from all doctors and over the counters    11/16/2017  f/u ov/Suzanne Dickson  re: copd III criteria but 02 dep/ did not bring all meds as req  Chief Complaint  Patient presents with  . Follow-up    Breathing better but back quite back to baseline. She still has rhinitis and PND. She is using her albuterol inhaler 8-10 x per wk.  She rarely uses her neb.   Dyspnea: .MMRC3 = can't walk 100 yards even at a slow pace at a flat grade s stopping due to sob  On RA or  3lpm  - not monitoring sats as rec  Cough: none  Sleeping: ok 1pillow / bed flat SABA use: as above  02: 2lpm hs / prn daytime  rec Prednisone 10 mg take  4 each am x 2 days,   2 each am x 2 days,  1 each am x 2 days and stop  - add  No 02 needed at rest, max 02 when walking more than room to room at home to prevent desats ENT eval  > [facility] saw 12/07/17 dx ? 02 irritation       02/17/2018  f/u ov/Suzanne Dickson re: gold III criteria but 02 dep, doing better with nasal symptoms on humidified 02  Chief Complaint  Patient presents with  . Follow-up    Breathing  is some better. She has some nasal congestion in the am- clear, bloody nasal d/c. She is using her proair 2-3 x per wk on average.   Dyspnea:  Better vs priors/ pred helped nasal symptoms a lot and also doe  Cough: no Sleeping: 2 pillows/ bed blocks on order  SABA use: a little more since stopped prednisone  02: 2lpm 24/7 but up to 4 active rec Plan A = Automatic = Trelegy one click first thing each am Plan B = Backup Only use your albuterol as a rescue medication Plan C = Crisis - only use your albuterol nebulizer if you first try Plan B and it fails to help > ok to use the nebulizer up to every 4 hours but if start needing it regularly call for immediate appointmentPlan D = Deltasone= Prednisone - if needing the nebulizer more than twice daily then start prednisone : Prednisone 10 mg take  4 each am x 2 days,   2 each am x 2 days,  1 each am x 2 days and stop     Virtual Visit via Telephone Note 10/06/2018  GOLD II/ 02 dep 2lpm /4lpm walking maint trelegy and pred as plan D but not understanding when to activate  History of Present Illness: Dyspnea:  At her best can walk inside house with one flight of steps then once stops pred worse over the next week or two and increase need for neb  Despite maint trelegy  Cough: none at all now Sleeping: ok on wedge  SABA use: using first thing in am / no pred in sev months 02: 2lpm hs / 2 resting and 4lpm with activity  rec Remember prednisone is refillable and can be used when you are losing ground with your breathing and using more of your nebulizer than usual as Plan D below Plan A = Automatic = Trelegy one click first thing each am Plan B = Backup Only use your albuterol as a rescue medication Plan C = Crisis - only use your albuterol nebulizer if you first try Plan B and it fails to help > ok to use the nebulizer up to every 4 hours but if start needing it regularly call for immediate appointment  Plan D = Deltasone= Prednisone - if needing  the nebulizer more than twice daily then start prednisone : Prednisone 10 mg take  4 each am x 2 days,   2 each am x 2 days,  1 each am x 2 days and stop  Please schedule a follow up visit in 3 months but call sooner if needed  with all medications /inhalers/ solutions in hand so we can verify exactly what you are taking. 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 In the ED she was noted to be satting at 96% on 6 L, with a white count of 13 SARS-CoV-2 test negative, chest x-ray showed postoperative changes.  Hospital course: Acute on chronic respiratory failure with hypoxia due to COPD exacerbation Hyperglycemia Asymmetric lower extremity edema CAD status post DES Hypertension Anemia of chronic disease Depression with anxiety OSA  Patient was admitted placed on steroids nebs supplemental O2 to maintain sats around 90 to 92%.  At baseline patient is on 4 L at home and requiring 6 to 7 L.  She was weaned down to her home oxygen maintain sats and feels that she is at baseline.  Patient's will continue a steroid taper complete antibiotics continue home medications without change. Hyperglycemia with an A1c of 5.4 this is  consistent with hyperglycemia secondary to steroids.  Patient will follow up with PCP for continued outpatient surveillance.  Anticipate this will improve with completion of taper. Asymmetric lower extremity edema.  Patient bilateral lower extremity Dopplers were negative for DVT I suspect this is mild lymphedema.  No evidence of infection, no evidence of heart failure. CAD status post DES.  Patient is on aspirin Toprol and Plavix which we will continue.  She will follow with her PCP for continued management of lipids. Hypertension medications as above   Discharge Diagnoses:    Acute on chronic respiratory failure with hypoxia (HCC)   Essential hypertension   Chronic respiratory failure with hypoxia (HCC)   COPD exacerbation (HCC)   OSA on CPAP   CAD (coronary artery disease)   Anemia of chronic disease    Virtual Visit via Telephone Note 01/15/2019   History of Present Illness: maint trelegy and prednisone weaning down to 20 mg  F/u Suzanne Dickson re: p hosp for aecopd Dyspnea:  Better p last admit  Cough: not much at all  Sleeping: 45 degrees wedge SABA use: not at all  02: 2 lpm 24h per day  Observations/Objective: No conversational sob/ minimal rattling on voluntary cough/ nl voice texture rec Prednisone 10 mg remain on  2 daily until  feeling better then 10 mg one daily thru the holidays  Make sure you check your oxygen saturations at highest level of activity to be sure it stays over 90%  and adjust upward to maintain this level if needed but remember to turn it back to previous settings when you stop (to conserve your supply).  Please schedule a follow up office visit in 4 weeks, sooner if needed  with all medications /inhalers/ solutions in hand so we can verify exactly what you are taking. This includes all medications from all doctors and over the counters     Virtual Visit via Telephone Note 02/12/2019   I connected with Suzanne Dickson on 02/12/19 at  1:30 PM EST by telephone and  verified that I am speaking with the correct person using two identifiers.   I discussed the limitations, risks, security and privacy concerns of performing an evaluation and management service by telephone and the availability of in person appointments. I also discussed with the patient that there may be a patient responsible charge related to this service. The patient expressed understanding and agreed to proceed.   History of Present Illness: Was better on 2 pred x 10 p 3 days and felt better so went to 10 mg/day and after 5 d then worse nasal congestion so since around 1st of year on pred 20 mg and not consistently better nasal symptoms but breathing ok Dyspnea:  Better than usual Cough: assoc with pnds worse but no purulent secretions Sleeping: 45 degrees hob SABA use: hfa sev times a day but no neb  02: 2pm hs and prn daytime up to 4lpm    No obvious day to day or daytime variability or assoc excess/ purulent sputum or mucus plugs or hemoptysis or cp or chest tightness, subjective wheeze or overt sinus or hb symptoms.    Also denies any obvious fluctuation of symptoms with weather or environmental changes or other aggravating or alleviating factors except as outlined above.   Meds reviewed/ med reconciliation completed          Observations/Objective: Sounds great on the phone, no conversational sob or spont coughing, min hoarseness    Assessment and Plan: See problem list for active a/p's   Follow Up Instructions: See avs for instructions unique to this ov which includes revised/ updated med list   Outpatient Encounter Medications as of 02/12/2019  Medication Sig  . albuterol (PROVENTIL) (2.5 MG/3ML) 0.083% nebulizer solution Take 3 mLs (2.5 mg total) by nebulization every 4 (four) hours as needed. For shortness of breath (Patient taking differently: Take 2.5 mg by nebulization every 4 (four) hours as needed for wheezing or shortness of breath. )  . albuterol (VENTOLIN  HFA) 108 (90 Base) MCG/ACT inhaler Inhale 2 puffs into the lungs every 4 (four) hours as needed for wheezing or shortness of breath.  Marland Kitchen amLODipine (NORVASC) 5 MG tablet Take 1 tablet (5 mg total) by mouth daily. (Patient taking differently: Take 10 mg by mouth every morning. )  . aspirin EC 81 MG tablet Take 81 mg by mouth every morning.  . Azelastine-Fluticasone (DYMISTA) 137-50 MCG/ACT SUSP 2 pffs each nostril at bedtime  . cetirizine (ZYRTEC) 10 MG tablet Take 10 mg by mouth at bedtime.  . clopidogrel (PLAVIX) 75 MG tablet Take 75 mg by mouth every morning.   . famotidine (PEPCID) 20 MG tablet One after supper  . ferrous sulfate 325 (65 FE) MG EC tablet Take 325 mg by mouth 3 (three) times daily with meals.   . Fluticasone-Umeclidin-Vilant (TRELEGY ELLIPTA) 100-62.5-25 MCG/INH AEPB Inhale 1 puff into the lungs daily. (Patient taking differently: Inhale 1 puff into the lungs every morning. )  .  guaiFENesin (MUCINEX) 600 MG 12 hr tablet Take 600 mg by mouth every 12 (twelve) hours.   Marland Kitchen ibuprofen (ADVIL) 200 MG tablet Take 400 mg by mouth every 6 (six) hours as needed (pain).  Marland Kitchen losartan (COZAAR) 50 MG tablet Take 50 mg by mouth every morning.  . metoprolol succinate (TOPROL-XL) 25 MG 24 hr tablet Take 25 mg by mouth every morning.   . nitroGLYCERIN (NITRODUR - DOSED IN MG/24 HR) 0.4 mg/hr patch Place 0.4 mg onto the skin every morning.   . nitroGLYCERIN (NITROSTAT) 0.4 MG SL tablet Place 0.4 mg under the tongue every 5 (five) minutes as needed for chest pain.  Marland Kitchen OVER THE COUNTER MEDICATION Take 1 tablet by mouth at bedtime. OTC sleep aid - unknown ingredients  . OXYGEN Inhale 2-4 L/min into the lungs See admin instructions. 2-3 lpm at rest and 4 lpm with exertion  . pantoprazole (PROTONIX) 40 MG tablet Take 30-60 min before first meal of the day (Patient taking differently: Take 40 mg by mouth daily before breakfast. )  . Polyvinyl Alcohol-Povidone (REFRESH OP) Place 1 drop into both eyes daily as  needed (dry eyes).  . predniSONE (DELTASONE) 10 MG tablet 2 daily until all better then 1 daily  . sertraline (ZOLOFT) 100 MG tablet Take 100 mg by mouth 2 (two) times daily.  . [DISCONTINUED] loratadine (CLARITIN) 10 MG tablet Take 10 mg by mouth daily.   No facility-administered encounter medications on file as of 02/12/2019.      I discussed the assessment and treatment plan with the patient. The patient was provided an opportunity to ask questions and all were answered. The patient agreed with the plan and demonstrated an understanding of the instructions.   The patient was advised to call back or seek an in-person evaluation if the symptoms worsen or if the condition fails to improve as anticipated.  I provided 22 minutes of non-face-to-face time during this encounter.   Christinia Gully, MD

## 2019-02-12 NOTE — Patient Instructions (Signed)
Dymista two puffs in each nostril at bedtime and if not better see Dr Janace Hoard   Continue zyrtec and pepcid at bedtime   Please schedule a follow up office visit in 6 weeks, call sooner if needed with all medications /inhalers/ solutions in hand so we can verify exactly what you are taking. This includes all medications from all doctors and over the counters

## 2019-02-13 ENCOUNTER — Encounter: Payer: Self-pay | Admitting: Internal Medicine

## 2019-02-13 NOTE — Assessment & Plan Note (Signed)
Started on 02 2lpm at d/c 09/04/14  - 10/28/2014   Walked RA x one lap @ 185 stopped due to  desat to 82%  at nl pace so rec 02 2lpm with walking and sleeping  - 12/09/2014 referred for POC > did not qualify - 08/17/2016 Patient Saturations on Room Air at Rest = 85%---increased to 93% on 2lpm o2 - 04/18/2017  Walking across exam room sats dropped into 80's on 2lpm  - 11/16/2017   Walked RA x one lap @ 185 stopped due to 87% and even on 5lpm could not maintain sats at nl pace  -  01/11/2018 humidfy 02  Added > not using as of 05/08/2018 > rec she do so > improved on humidity  As of  02/12/2019   rx  2lpm hs, 2lpm resting, and up to 4 lpm with activity or whatever is needed to keep sats > 90% with activity depending on intensity      Adequate control on present rx, reviewed in detail with pt > no change in rx needed  - if can do without 02 at rest should do so to spare nasal mucosa.

## 2019-02-13 NOTE — Assessment & Plan Note (Signed)
Quit smoking 08/2014 - PFT's 12/09/14   FEV1 0.75 (44 % ) ratio 62  p no % improvement from saba p ? prior to study with DLCO  24 % corrects to 48 % for alv volume    - 12/09/2014 referred to rehab > started 12/2014  - 04/25/2015   try BEVESPI  - 06/20/2015  Changed by insurance> back on symb 160  - 09/22/2015 not happy with symbicort > changed back to besvespi 2bid > not happy with bevespi > changed back to symb 160 2bid and consider adding spiriva next vs trelegy trial   - 02/17/2018  After extensive coaching inhaler device,  effectiveness =    50% with hfa and 90% with elipta so rec changed to trelegy   - 05/08/2018 added pred x 6 days as "plan D"  - 01/15/2019 changed pred to 20 mg until better then 10 mg daily   Group D in terms of symptom/risk and laba/lama/ICS  therefore appropriate rx at this point >>>  Continue trelegy/ prn saba   Pt informed of the seriousness of COVID 19 infection as a direct risk to lung health  and safey and to close contacts and should continue to wear a facemask in public and minimize exposure to public locations but especially avoid any area or activity where non-close contacts are not observing distancing or wearing an appropriate face mask.  I strongly recommended vaccine when offered.

## 2019-02-13 NOTE — Assessment & Plan Note (Signed)
Sinus CT 10/31/2014>  No sinusitis is seen  - Allergy profile 10/19/2017 >  Eos 0.1 /  IgE 67  RAST neg  - Repeat Sinus CT 11/30/17 >  Min thickening - ENT eval Janace Hoard 12/07/17 :  Non-specific crusting only ? Related to 02 irritation  - 02/12/2019 rec trial of dymista, f/u [facility] prn

## 2019-02-26 ENCOUNTER — Other Ambulatory Visit: Payer: Self-pay

## 2019-02-26 ENCOUNTER — Inpatient Hospital Stay: Payer: Medicare PPO | Attending: Internal Medicine

## 2019-02-26 ENCOUNTER — Ambulatory Visit (HOSPITAL_COMMUNITY)
Admission: RE | Admit: 2019-02-26 | Discharge: 2019-02-26 | Disposition: A | Discharge status: Home / Self Care | Payer: Medicare PPO | Source: Ambulatory Visit | Attending: Internal Medicine | Admitting: Internal Medicine

## 2019-02-26 DIAGNOSIS — Z7952 Long term (current) use of systemic steroids: Secondary | ICD-10-CM | POA: Insufficient documentation

## 2019-02-26 DIAGNOSIS — Z79899 Other long term (current) drug therapy: Secondary | ICD-10-CM | POA: Insufficient documentation

## 2019-02-26 DIAGNOSIS — M1990 Unspecified osteoarthritis, unspecified site: Secondary | ICD-10-CM | POA: Insufficient documentation

## 2019-02-26 DIAGNOSIS — E041 Nontoxic single thyroid nodule: Secondary | ICD-10-CM | POA: Diagnosis not present

## 2019-02-26 DIAGNOSIS — I700 Atherosclerosis of aorta: Secondary | ICD-10-CM | POA: Diagnosis not present

## 2019-02-26 DIAGNOSIS — R0609 Other forms of dyspnea: Secondary | ICD-10-CM | POA: Diagnosis not present

## 2019-02-26 DIAGNOSIS — I10 Essential (primary) hypertension: Secondary | ICD-10-CM | POA: Insufficient documentation

## 2019-02-26 DIAGNOSIS — C3490 Malignant neoplasm of unspecified part of unspecified bronchus or lung: Secondary | ICD-10-CM | POA: Insufficient documentation

## 2019-02-26 DIAGNOSIS — J439 Emphysema, unspecified: Secondary | ICD-10-CM | POA: Diagnosis not present

## 2019-02-26 DIAGNOSIS — J449 Chronic obstructive pulmonary disease, unspecified: Secondary | ICD-10-CM | POA: Diagnosis not present

## 2019-02-26 DIAGNOSIS — R05 Cough: Secondary | ICD-10-CM | POA: Insufficient documentation

## 2019-02-26 DIAGNOSIS — I2510 Atherosclerotic heart disease of native coronary artery without angina pectoris: Secondary | ICD-10-CM | POA: Insufficient documentation

## 2019-02-26 DIAGNOSIS — C3412 Malignant neoplasm of upper lobe, left bronchus or lung: Secondary | ICD-10-CM | POA: Diagnosis present

## 2019-02-26 DIAGNOSIS — N200 Calculus of kidney: Secondary | ICD-10-CM | POA: Diagnosis not present

## 2019-02-26 DIAGNOSIS — R5383 Other fatigue: Secondary | ICD-10-CM | POA: Diagnosis not present

## 2019-02-26 LAB — CBC WITH DIFFERENTIAL (CANCER CENTER ONLY)
Abs Immature Granulocytes: 0.06 10*3/uL (ref 0.00–0.07)
Basophils Absolute: 0 10*3/uL (ref 0.0–0.1)
Basophils Relative: 0 %
Eosinophils Absolute: 0.2 10*3/uL (ref 0.0–0.5)
Eosinophils Relative: 2 %
HCT: 32.5 % — ABNORMAL LOW (ref 36.0–46.0)
Hemoglobin: 9.2 g/dL — ABNORMAL LOW (ref 12.0–15.0)
Immature Granulocytes: 1 %
Lymphocytes Relative: 18 %
Lymphs Abs: 1.7 10*3/uL (ref 0.7–4.0)
MCH: 25.6 pg — ABNORMAL LOW (ref 26.0–34.0)
MCHC: 28.3 g/dL — ABNORMAL LOW (ref 30.0–36.0)
MCV: 90.5 fL (ref 80.0–100.0)
Monocytes Absolute: 0.8 10*3/uL (ref 0.1–1.0)
Monocytes Relative: 9 %
Neutro Abs: 6.6 10*3/uL (ref 1.7–7.7)
Neutrophils Relative %: 70 %
Platelet Count: 198 10*3/uL (ref 150–400)
RBC: 3.59 MIL/uL — ABNORMAL LOW (ref 3.87–5.11)
RDW: 14.8 % (ref 11.5–15.5)
WBC Count: 9.4 10*3/uL (ref 4.0–10.5)
nRBC: 0 % (ref 0.0–0.2)

## 2019-02-26 LAB — CMP (CANCER CENTER ONLY)
ALT: 32 U/L (ref 0–44)
AST: 22 U/L (ref 15–41)
Albumin: 4 g/dL (ref 3.5–5.0)
Alkaline Phosphatase: 61 U/L (ref 38–126)
Anion gap: 8 (ref 5–15)
BUN: 25 mg/dL — ABNORMAL HIGH (ref 8–23)
CO2: 30 mmol/L (ref 22–32)
Calcium: 9.4 mg/dL (ref 8.9–10.3)
Chloride: 107 mmol/L (ref 98–111)
Creatinine: 1.18 mg/dL — ABNORMAL HIGH (ref 0.44–1.00)
GFR, Est AFR Am: 53 mL/min — ABNORMAL LOW (ref >60)
GFR, Estimated: 46 mL/min — ABNORMAL LOW (ref >60)
Glucose, Bld: 106 mg/dL — ABNORMAL HIGH (ref 70–99)
Potassium: 4.1 mmol/L (ref 3.5–5.1)
Sodium: 145 mmol/L (ref 135–145)
Total Bilirubin: 0.3 mg/dL (ref 0.3–1.2)
Total Protein: 6.8 g/dL (ref 6.5–8.1)

## 2019-02-26 MED ORDER — SODIUM CHLORIDE (PF) 0.9 % IJ SOLN
INTRAMUSCULAR | Status: AC
  Filled 2019-02-26: qty 50

## 2019-02-26 MED ORDER — IOHEXOL 300 MG/ML  SOLN
75.0000 mL | Freq: Once | INTRAMUSCULAR | Status: AC | PRN
  Administered 2019-02-26: 75 mL via INTRAVENOUS

## 2019-02-28 ENCOUNTER — Inpatient Hospital Stay (HOSPITAL_BASED_OUTPATIENT_CLINIC_OR_DEPARTMENT_OTHER): Payer: Medicare PPO | Admitting: Internal Medicine

## 2019-02-28 ENCOUNTER — Encounter: Payer: Self-pay | Admitting: Internal Medicine

## 2019-02-28 ENCOUNTER — Other Ambulatory Visit: Payer: Self-pay

## 2019-02-28 VITALS — BP 186/68 | HR 87 | Temp 97.4°F | Resp 19 | Ht 63.0 in

## 2019-02-28 DIAGNOSIS — C3492 Malignant neoplasm of unspecified part of left bronchus or lung: Secondary | ICD-10-CM | POA: Diagnosis not present

## 2019-02-28 DIAGNOSIS — Z85118 Personal history of other malignant neoplasm of bronchus and lung: Secondary | ICD-10-CM | POA: Diagnosis not present

## 2019-02-28 DIAGNOSIS — I10 Essential (primary) hypertension: Secondary | ICD-10-CM | POA: Diagnosis not present

## 2019-02-28 DIAGNOSIS — C3412 Malignant neoplasm of upper lobe, left bronchus or lung: Secondary | ICD-10-CM | POA: Diagnosis not present

## 2019-02-28 DIAGNOSIS — Z923 Personal history of irradiation: Secondary | ICD-10-CM

## 2019-02-28 DIAGNOSIS — C3490 Malignant neoplasm of unspecified part of unspecified bronchus or lung: Secondary | ICD-10-CM | POA: Diagnosis not present

## 2019-02-28 NOTE — Progress Notes (Signed)
[facility] [phone]   [phone]  OFFICE PROGRESS NOTE  [name], MD [phone] [address] [place] [address]  DIAGNOSIS: Stage IA (T1a, N0, M0) non-small cell lung cancer, adenocarcinoma presented with left upper lobe lung nodule diagnosed in March 2018.  PRIOR THERAPY: S/P RT to the left lung nodule under the care of Dr. Sondra Come completed on 05/04/2016.  CURRENT THERAPY: Observation.  INTERVAL HISTORY: Suzanne Dickson 74 y.o. female returns to the clinic today for follow-up visit accompanied by her son.  The patient is feeling fine today with no concerning complaints except for the baseline shortness of breath and she is currently on home oxygen.  She is followed by Dr. Melvyn Novas for COPD.  The patient denied having any current chest pain but has cough with no hemoptysis.  She denied having any recent weight loss or night sweats.  She has no nausea, vomiting, diarrhea or constipation.  She is very anxious about her scan and her blood pressure is very elevated today.  She did not take her blood pressure medicine earlier today.  She had repeat CT scan of the chest performed recently and she is here for evaluation and discussion of her risk her results.   MEDICAL HISTORY: Past Medical History:  Diagnosis Date  . Adenocarcinoma of left lung, stage 1 ([place]) 04/15/2016   "I'm in remission" (04/05/2017)  . Anxiety   . Arthritis    "legs, elbows" (04/05/2017)  . Bronchitis, chronic ([place])    "haven't had it in awhile" (04/05/2017)  . CHF (congestive heart failure) ([place])   . COPD (chronic obstructive pulmonary disease) ([place])   . Daily headache    "recently" (04/05/2017)  . Depression   . Dyspnea    with exertion  . GERD (gastroesophageal reflux disease)   . History of blood transfusion 1950s  . History of radiation therapy 05/04/16 - 05/12/16   Left lung treated to 54 Gy with 3 fx of 18 Gy  . Hypertension   . On home oxygen therapy    "2L; 24/7" (04/05/2017)  . OSA on CPAP   . Pneumonia 10/2015   "at least 3 times" (04/05/2017)    ALLERGIES:  has No Known Allergies.  MEDICATIONS:  Current Outpatient Medications  Medication Sig Dispense Refill  . albuterol (PROVENTIL) (2.5 MG/3ML) 0.083% nebulizer solution Take 3 mLs (2.5 mg total) by nebulization every 4 (four) hours as needed. For shortness of breath (Patient taking differently: Take 2.5 mg by nebulization every 4 (four) hours as needed for wheezing or shortness of breath. ) 75 mL 12  . albuterol (VENTOLIN HFA) 108 (90 Base) MCG/ACT inhaler Inhale 2 puffs into the lungs every 4 (four) hours as needed for wheezing or shortness of breath. 18 g 2  . amLODipine (NORVASC) 5 MG tablet Take 1 tablet (5 mg total) by mouth daily. (Patient taking differently: Take 10 mg by mouth every morning. ) 30 tablet 3  . aspirin EC 81 MG tablet Take 81 mg by mouth every morning.    . Azelastine-Fluticasone (DYMISTA) 137-50 MCG/ACT SUSP 2 pffs each nostril at bedtime 23 g 11  . cetirizine (ZYRTEC) 10 MG tablet Take 10 mg by mouth at bedtime.    . clopidogrel (PLAVIX) 75 MG tablet Take 75 mg by mouth every morning.     . famotidine (PEPCID) 20 MG tablet One after supper 30 tablet 11  . ferrous sulfate 325 (65 FE) MG EC tablet Take  325 mg by mouth 3 (three) times daily with meals.     . Fluticasone-Umeclidin-Vilant (TRELEGY ELLIPTA) 100-62.5-25 MCG/INH AEPB Inhale 1 puff into the lungs daily. (Patient taking differently: Inhale 1 puff into the lungs every morning. ) 60 each 11  . guaiFENesin (MUCINEX) 600 MG 12 hr tablet Take 600 mg by mouth every 12 (twelve) hours.     Marland Kitchen ibuprofen (ADVIL) 200 MG tablet Take 400 mg by mouth every 6 (six) hours as needed (pain).    Marland Kitchen losartan (COZAAR) 50 MG tablet Take 50 mg by mouth every morning.    . metoprolol succinate (TOPROL-XL) 25 MG 24 hr tablet Take 25 mg by mouth every morning.     . nitroGLYCERIN (NITRODUR - DOSED IN MG/24 HR) 0.4 mg/hr patch Place 0.4  mg onto the skin every morning.   3  . nitroGLYCERIN (NITROSTAT) 0.4 MG SL tablet Place 0.4 mg under the tongue every 5 (five) minutes as needed for chest pain.    Marland Kitchen OVER THE COUNTER MEDICATION Take 1 tablet by mouth at bedtime. OTC sleep aid - unknown ingredients    . OXYGEN Inhale 2-4 L/min into the lungs See admin instructions. 2-3 lpm at rest and 4 lpm with exertion    . pantoprazole (PROTONIX) 40 MG tablet Take 30-60 min before first meal of the day (Patient taking differently: Take 40 mg by mouth daily before breakfast. ) 90 tablet 3  . Polyvinyl Alcohol-Povidone (REFRESH OP) Place 1 drop into both eyes daily as needed (dry eyes).    . predniSONE (DELTASONE) 10 MG tablet 2 daily until all better then 1 daily 100 tablet 2  . sertraline (ZOLOFT) 100 MG tablet Take 100 mg by mouth 2 (two) times daily.  0   No current facility-administered medications for this visit.    SURGICAL HISTORY:  Past Surgical History:  Procedure Laterality Date  . COLONOSCOPY    . CORONARY ANGIOPLASTY WITH STENT PLACEMENT  04/05/2017  . CORONARY STENT INTERVENTION N/A 04/05/2017   Procedure: CORONARY STENT INTERVENTION;  Surgeon: [name], MD;  Location: [facility];  Service: Cardiovascular;  Laterality: N/A;  . DILATION AND CURETTAGE OF UTERUS    . FUDUCIAL PLACEMENT Left 03/31/2016   Procedure: PLACEMENT OF FUDUCIAL LEFT UPPER LOBE;  Surgeon: Collene Gobble, MD;  Location: [facility];  Service: Thoracic;  Laterality: Left;  . LAPAROSCOPIC CHOLECYSTECTOMY    . LEFT HEART CATH AND CORONARY ANGIOGRAPHY N/A 02/17/2017   Procedure: LEFT HEART CATH AND CORONARY ANGIOGRAPHY;  Surgeon: [name], MD;  Location: [facility];  Service: Cardiovascular;  Laterality: N/A;  . SINUSOTOMY    . TONSILLECTOMY    . TUBAL LIGATION    . VIDEO BRONCHOSCOPY WITH ENDOBRONCHIAL NAVIGATION N/A 03/31/2016   Procedure: VIDEO BRONCHOSCOPY WITH ENDOBRONCHIAL NAVIGATION;  Surgeon: Collene Gobble, MD;  Location: [facility];   Service: Thoracic;  Laterality: N/A;    REVIEW OF SYSTEMS:  A comprehensive review of systems was negative except for: Constitutional: positive for fatigue Respiratory: positive for dyspnea on exertion   PHYSICAL EXAMINATION: General appearance: alert, cooperative, fatigued and no distress Head: Normocephalic, without obvious abnormality, atraumatic Neck: no adenopathy, no JVD, supple, symmetrical, trachea midline and thyroid not enlarged, symmetric, no tenderness/mass/nodules Lymph nodes: Cervical, supraclavicular, and axillary nodes normal. Resp: wheezes bilaterally Back: symmetric, no curvature. ROM normal. No CVA tenderness. Cardio: regular rate and rhythm, S1, S2 normal, no murmur, click, rub or gallop GI: soft, non-tender; bowel sounds normal; no masses,  no organomegaly Extremities: extremities normal, atraumatic, no cyanosis or edema  ECOG PERFORMANCE STATUS: 1 - Symptomatic but completely ambulatory  Blood pressure (!) 186/68, pulse 87, temperature (!) 97.4 F (36.3 C), temperature source Temporal, resp. rate 19, height 5\' 3"  (1.6 m), SpO2 90 %.  LABORATORY DATA: Lab Results  Component Value Date   WBC 9.4 02/26/2019   HGB 9.2 (L) 02/26/2019   HCT 32.5 (L) 02/26/2019   MCV 90.5 02/26/2019   PLT 198 02/26/2019      Chemistry      Component Value Date/Time   NA 145 02/26/2019 1106   NA 141 08/13/2016 1120   K 4.1 02/26/2019 1106   K 3.4 (L) 08/13/2016 1120   CL 107 02/26/2019 1106   CO2 30 02/26/2019 1106   CO2 25 08/13/2016 1120   BUN 25 (H) 02/26/2019 1106   BUN 15.8 08/13/2016 1120   CREATININE 1.18 (H) 02/26/2019 1106   CREATININE 1.1 08/13/2016 1120      Component Value Date/Time   CALCIUM 9.4 02/26/2019 1106   CALCIUM 9.1 08/13/2016 1120   ALKPHOS 61 02/26/2019 1106   ALKPHOS 68 08/13/2016 1120   AST 22 02/26/2019 1106   AST 20 08/13/2016 1120   ALT 32 02/26/2019 1106   ALT 14 08/13/2016 1120   BILITOT 0.3 02/26/2019 1106   BILITOT 0.47 08/13/2016  1120       RADIOGRAPHIC STUDIES: CT Chest W Contrast  Result Date: 02/26/2019 CLINICAL DATA:  Lung cancer follow-up, XRT complete EXAM: CT CHEST WITH CONTRAST TECHNIQUE: Multidetector CT imaging of the chest was performed during intravenous contrast administration. CONTRAST:  60mL OMNIPAQUE IOHEXOL 300 MG/ML  SOLN COMPARISON:  11/24/2018, 08/25/2019, 02/20/2018 FINDINGS: Cardiovascular: Aortic atherosclerosis. Normal heart size. Extensive 3 vessel coronary artery calcifications and/or stents. No pericardial effusion. Mediastinum/Nodes: No enlarged mediastinal, hilar, or axillary lymph nodes. Unchanged low-attenuation nodule of the right lobe of the thyroid measuring 1.8 cm (series 2, image 8). Trachea, and esophagus demonstrate no significant findings. Lungs/Pleura: Severe centrilobular emphysema. Unchanged bandlike post treatment fibrotic change and volume loss of the perihilar left lung with adjacent fiducial markers (series 7, image 64). Unchanged bandlike scarring of the medial right upper lobe and medial segment right middle lobe. A previously described right lower lobe nodule is resolved. No pleural effusion or pneumothorax. Upper Abdomen: No acute abnormality. Multiple nonobstructive left renal calculi. Musculoskeletal: No chest wall mass or suspicious bone lesions identified. IMPRESSION: 1. Unchanged bandlike post treatment fibrotic change and volume loss of the perihilar left lung with adjacent fiducial markers. Unchanged bandlike scarring of the medial right upper lobe and medial segment right middle lobe. No evidence of recurrent malignancy or lymphadenopathy. 2.  Emphysema (ICD10-J43.9). 3.  Coronary artery disease.  Aortic Atherosclerosis (ICD10-I70.0). 4.  Nonobstructive left nephrolithiasis. Electronically Signed   By: Eddie Candle M.D.   On: 02/26/2019 13:58    ASSESSMENT AND PLAN: This is a very pleasant 75 years old white female with a stage IA non-small cell lung cancer, adenocarcinoma  presented with left upper lobe lung nodule. The patient is status post stereotactic radiotherapy to the left upper lobe lung nodule and she tolerated the procedure well. The patient is currently on observation and she is feeling fine today with no concerning complaints except for the baseline shortness of breath secondary to COPD. She had repeat CT scan of the chest performed recently.  I personally and independently reviewed the scans and discussed the results with the patient and her son. Her scan  showed no concerning findings for disease recurrence or metastasis. I recommended for the patient to continue on observation with repeat CT scan of the chest in 1 year. She was advised to call immediately if she has any concerning symptoms in the interval. The patient voices understanding of current disease status and treatment options and is in agreement with the current care plan. All questions were answered. The patient knows to call the clinic with any problems, questions or concerns. We can certainly see the patient much sooner if necessary.  Disclaimer: This note was dictated with voice recognition software. Similar sounding words can inadvertently be transcribed and may not be corrected upon review.

## 2019-03-02 ENCOUNTER — Telehealth: Payer: Self-pay | Admitting: Internal Medicine

## 2019-03-02 NOTE — Telephone Encounter (Signed)
Scheduled per los. Called and left msg. Mailed printout  °

## 2019-03-07 ENCOUNTER — Telehealth: Payer: Self-pay | Admitting: Internal Medicine

## 2019-03-07 MED ORDER — TRELEGY ELLIPTA 100-62.5-25 MCG/INH IN AEPB: 1.0000 | INHALATION_SPRAY | Freq: Every day | RESPIRATORY_TRACT | 11 refills | Status: DC

## 2019-03-07 NOTE — Telephone Encounter (Signed)
Spoke with pt. She is needing a refill on Trelegy. Rx has been sent in. Nothing further was needed.

## 2019-03-22 ENCOUNTER — Other Ambulatory Visit: Payer: Self-pay | Admitting: Internal Medicine

## 2019-03-22 MED ORDER — ALBUTEROL SULFATE HFA 108 (90 BASE) MCG/ACT IN AERS: 2.0000 | INHALATION_SPRAY | RESPIRATORY_TRACT | 5 refills | Status: DC | PRN

## 2019-03-22 NOTE — Telephone Encounter (Signed)
Received faxed refill request from [facility]  Medication name/strength/dose: Ventolin HFA Medication last rx'd: 11/2018 Quantity and number of refills last rx'd: 11g with 2 refills  Patient last seen in the office on 1.18.21  Does refill need to be authorized by a provider? no Refill authorized (yes or no)?: yes

## 2019-03-29 ENCOUNTER — Ambulatory Visit (INDEPENDENT_AMBULATORY_CARE_PROVIDER_SITE_OTHER): Payer: Medicare PPO | Admitting: Internal Medicine

## 2019-03-29 ENCOUNTER — Encounter: Payer: Self-pay | Admitting: Internal Medicine

## 2019-03-29 ENCOUNTER — Other Ambulatory Visit: Payer: Self-pay

## 2019-03-29 DIAGNOSIS — J9611 Chronic respiratory failure with hypoxia: Secondary | ICD-10-CM | POA: Diagnosis not present

## 2019-03-29 DIAGNOSIS — J449 Chronic obstructive pulmonary disease, unspecified: Secondary | ICD-10-CM

## 2019-03-29 DIAGNOSIS — J328 Other chronic sinusitis: Secondary | ICD-10-CM

## 2019-03-29 NOTE — Patient Instructions (Addendum)
Make sure you check your oxygen saturations at highest level of activity to be sure it stays over 90% and adjust upward to maintain this level if needed but remember to turn it back to previous settings when you stop (to conserve your supply).   Try albuterol 15 min before an activity that you know would make you short of breath and see if it makes any difference and if makes none then don't take it after activity unless you can't catch your breath.     See your ENT doctor regarding your nasal congestion if dymista not helping  Please schedule a follow up visit in 3 months but call sooner if needed

## 2019-03-29 NOTE — Progress Notes (Signed)
Subjective:     Patient ID: Suzanne Dickson, female   DOB: 1946-01-01     MRN: [id number]    Brief patient profile:  72 yobf quit smoking on admit 09/02/14  and proved to have GOLD III copd 11/2014     History of Present Illness  09/22/2015  f/u ov/Suzanne Dickson re: copd III/ symb 1602bid on 2lpm at rest/ 4lpm ex  Chief Complaint  Patient presents with  . Follow-up    [place] had hemoptysis and increased SOB. She took round pred and augmentin and symptoms are some better. She has noticed minimal wheezing at night. She has used neb 3 x over the past 2 wks.   liked bevespi better but wasn't covered then and not doing as well on the symbicort though hfa not ideal  - see a/p rec Plan A = Automatic =stop symbicort and restart Bevespi Take 2 puffs first thing in am and then another 2 puffs about 12 hours later.  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 Acute Visit    Breathing has been worse x 2 wks. She has been producing some yellow sputum over the past 2 days. She states also wheezing. She has been using her albuterol inhaler 3 x per day and she has not used neb.   Dyspnea:  MMRC3 = baseline can't walk 100 yards even at a slow pace at a flat grade s stopping due to sob on 2lpm but sometimes on RA now worse 2 weeks assoc with watery rhinitis no better with zyrtec  Cough:  more productive x 2 weeks assoc with pnds but min yellow  Sleeping: 2lpm  Bed flat and 2 big pillows  SABA use: not usually needing but now tid  02: 2lpm baseline now up to 3lpm  rec Prednisone 10 mg take  4 each am x 2 days,   2 each am x 2 days,  1 each am x 2 days and stop  Plan A = Automatic = symbicort 160 Take 2 puffs first thing in am and then another 2 puffs about 12 hours later.  Plan B = Backup Only use your albuterol as a rescue medication Plan C = Crisis - only use your albuterol nebulizer if you first try Plan B and it fails to help > ok to use the nebulizer up to every 4 hours but if start needing it regularly call for immediate appointment Goal for 02 is to keep saturation over 90%  Please schedule a follow up office visit in 4 weeks, sooner if needed  with all medications /inhalers/ solutions in hand so we can verify exactly what you are taking. This includes all medications from all doctors and over the counters    11/16/2017  f/u  ov/Suzanne Dickson re: copd III criteria but 02 dep/ did not bring all meds as req  Chief Complaint  Patient presents with  . Follow-up    Breathing better but back quite back to baseline. She still has rhinitis and PND. She is using her albuterol inhaler 8-10 x per wk.  She rarely uses her neb.   Dyspnea: .MMRC3 = can't walk 100 yards even at a slow pace at a flat grade s stopping due to sob  On RA or  3lpm  - not monitoring sats as rec  Cough: none  Sleeping: ok 1pillow / bed flat SABA use: as above  02: 2lpm hs / prn daytime  rec Prednisone 10 mg take  4 each am x 2 days,   2 each am x 2 days,  1 each am x 2 days and stop  - add  No 02 needed at rest, max 02 when walking more than room to room at home to prevent desats ENT eval  > byers saw 12/07/17 dx ? 02 irritation     12/27/17 phone care for flair of nasal congestion purulent bronchitis: Augmentin 875 mg take one pill twice daily  X 10 days  Prednisone 10 mg take  4 each am x 2 days,   2 each  am x 2 days,  1 each am x 2 days and stop     01/11/2018  f/u ov/Suzanne Dickson re: gold III copd / still struggling with how/ when to use 02 which is irritating her nose Chief Complaint  Patient presents with  . Follow-up    COPD GOLD III/O2 dep  Dyspnea:  MMRC4  = sob if tries to leave home or while getting dressed   Cough: x 2 days hs coughing fits  Sleeping: bed flat, 1-2 pillow  SABA use: on prednisone no need 02: 2lpm hs not humidified   rec Need to humidify your home 02 per [employer] Goal for your 02 is to keep saturations over 90 so don't use it at rest unless you need  Elevate the head of your bed on 6-8 inch blocks For cough > mucinex dm up to 1200 mg every 12 hours as needed If get worse >  Prednisone 10 mg take  4 each am x 2 days,   2 each am x 2 days,  1 each am x 2 days and stop      02/17/2018  f/u ov/Suzanne Dickson re: gold III criteria but 02 dep, doing better with nasal symptoms on humidified 02  Chief Complaint  Patient presents with  . Follow-up    Breathing is some better. She has some nasal congestion in the am- clear, bloody nasal d/c. She is using her proair 2-3 x per wk on average.   Dyspnea:  Better vs priors/ pred helped nasal symptoms a lot and also doe  Cough: no Sleeping: 2 pillows/ bed blocks on order  SABA use: a little more since stopped prednisone  02: 2lpm 24/7 but up to 4 active rec Plan A = Automatic = Trelegy one click first thing each am Plan B = Backup Only use your albuterol as a rescue medication Plan C = Crisis - only use your albuterol nebulizer if you first try Plan B and it fails to help > ok to use the nebulizer up to every 4 hours but if start needing it regularly call for immediate appointmentPlan D = Deltasone= Prednisone - if needing the nebulizer more than twice daily then start prednisone :  Prednisone 10 mg take  4 each am x 2 days,   2 each am x 2 days,  1 each am x 2 days and stop   Use humdified 02 and adjust to sats overy 90% during the day  2 lpm sitting  Sleeping and 4lpm    televist 10/06/2018 rec Remember prednisone is refillable and can be used when you are losing ground with your breathing and using more of your nebulizer than usual as Plan D below   Plan A = Automatic = Trelegy one click first thing each am Plan B = Backup Only use your albuterol as a rescue medication  Plan C = Crisis - only use your albuterol nebulizer if you first try Plan B and it fails to help > ok to use the nebulizer up to every 4 hours but if start needing it regularly call for immediate appointment Plan D = Deltasone= Prednisone - if needing the nebulizer more than twice daily then start prednisone : Prednisone 10 mg take  4 each am x 2 days,   2 each am x 2 days,  1 each am x 2 days and stop     02/12/19 Televist Dymista two puffs in each nostril at bedtime and if not better see Dr Janace Hoard  Continue zyrtec and pepcid at bedtime    03/29/2019  f/u ov/Suzanne Dickson re:  GOLD II/ 02 dep  On dymista 2 pffs hs / off pred now  Last took it one week prior to [place]  Patient presents with  . Follow-up    Breathing is overall doing well. She has occ wheezing at night. She is using her albuterol inhaler about 8 x per wk and rarely uses neb.   Dyspnea:  Room to room but 'fine if stays sitting"  = "helps me breathe better" (ie very poor insight into the goals of resp rx to improve activity tol) Cough: assoc with some wheezing /watery rhinits no better even  on prednisone/dymista Sleeping:on a wedge x 45 degrees  SABA use: rarely at hs ? Helps some  02: 4lpm with activity  And 2-3 lpm at hs    No obvious day to day or daytime variability or assoc excess/ purulent sputum or mucus plugs or hemoptysis or cp or chest tightness, subjective wheeze or overt sinus or hb symptoms.   99 without nocturnal  or early am exacerbation  of respiratory  c/o's or need for noct saba. Also denies any obvious fluctuation of symptoms with weather or environmental changes  or other aggravating or alleviating factors except as outlined above   No unusual exposure hx or h/o childhood pna/ asthma or knowledge of premature birth.  Current Allergies, Complete Past Medical History, Past Surgical History, Family History, and Social History were reviewed in [employer] record.  ROS  The following are not active complaints unless bolded Hoarseness, sore throat, dysphagia, dental problems, itching, sneezing,  nasal congestion or discharge of excess mucus or purulent secretions, ear ache,   fever, chills, sweats, unintended wt loss or wt gain, classically pleuritic or exertional cp,  orthopnea pnd or arm/hand swelling  or leg swelling, presyncope, palpitations, abdominal pain, anorexia, nausea, vomiting, diarrhea  or change in bowel habits or change in bladder habits, change in stools or change in urine, dysuria, hematuria,  rash, arthralgias, visual complaints, headache, numbness, weakness or ataxia or problems with walking or coordination,  change in mood or  memory.  Current Meds  Medication Sig  . albuterol (PROVENTIL) (2.5 MG/3ML) 0.083% nebulizer solution Take 3 mLs (2.5 mg total) by nebulization every 4 (four) hours as needed. For shortness of breath (Patient taking differently: Take 2.5 mg by nebulization every 4 (four) hours as needed for wheezing or shortness of breath. )  . albuterol (VENTOLIN HFA) 108 (90 Base) MCG/ACT inhaler Inhale 2 puffs into the lungs every 4 (four) hours as needed for wheezing or shortness of breath.  Marland Kitchen amLODipine (NORVASC) 5 MG tablet Take 1 tablet (5 mg total) by mouth daily. (Patient taking differently: Take 10 mg by mouth every morning. )  . aspirin EC 81 MG tablet Take 81 mg by mouth every morning.  . Azelastine-Fluticasone (DYMISTA) 137-50 MCG/ACT SUSP 2 pffs each nostril at bedtime  . cetirizine (ZYRTEC) 10 MG tablet Take 10 mg by mouth at bedtime.  . cholecalciferol (VITAMIN D3) 25 MCG (1000 UNIT) tablet  Take 1,000 Units by mouth daily.  . clopidogrel (PLAVIX) 75 MG tablet Take 75 mg by mouth every morning.   . Cyanocobalamin (B-12 PO) Take 1 tablet by mouth daily.  . famotidine (PEPCID) 20 MG tablet One after supper  . ferrous sulfate 325 (65 FE) MG EC tablet Take 325 mg by mouth 3 (three) times daily with meals.   . Fluticasone-Umeclidin-Vilant (TRELEGY ELLIPTA) 100-62.5-25 MCG/INH AEPB Inhale 1 puff into the lungs daily.  Marland Kitchen guaiFENesin (MUCINEX) 600 MG 12 hr tablet Take 600 mg by mouth every 12 (twelve) hours.   Marland Kitchen ibuprofen (ADVIL) 200 MG tablet Take 400 mg by mouth every 6 (six) hours as needed (pain).  Marland Kitchen losartan (COZAAR) 50 MG tablet Take 50 mg by mouth every morning.  . metoprolol succinate (TOPROL-XL) 25 MG 24 hr tablet Take 25 mg by mouth every morning.   . nitroGLYCERIN (NITRODUR - DOSED IN MG/24 HR) 0.4 mg/hr patch Place 0.4 mg onto the skin every morning.   . nitroGLYCERIN (NITROSTAT) 0.4 MG SL tablet Place 0.4 mg under the tongue every 5 (five) minutes as needed for chest pain.  Marland Kitchen OVER THE COUNTER MEDICATION Take 1 tablet by mouth at bedtime. OTC sleep aid - unknown ingredients  . OXYGEN Inhale 2-4 L/min into the lungs See admin instructions. 2-3 lpm at rest and 4 lpm with exertion  . pantoprazole (PROTONIX) 40 MG tablet Take 30-60 min before first meal of the day (Patient taking differently: Take 40 mg by mouth daily before breakfast. )  . Polyvinyl Alcohol-Povidone (REFRESH OP) Place 1 drop into both eyes daily as needed (dry eyes).  . predniSONE (DELTASONE) 10 MG tablet 2 daily until all better then 1 daily  . sertraline (ZOLOFT) 100 MG tablet Take 100 mg by mouth 2 (two) times daily.          Objective:   Physical Exam  Pleasant amb bf nad   03/29/2019     164  08/08/2018   154 10/28/2014        160 > 12/09/2014 161 > 03/14/2015 158 >    04/25/2015  161 >  06/20/2015  158 > 09/22/2015   159 > 11/19/2015 159 > 12/23/2015   159  > 02/03/2016    163  > 05/18/2016   161 > 11/17/2016    154 > 01/07/2017  157 > 04/18/2017   148 > 07/19/2017  145 > 10/19/2017    150  > 11/16/2017   150 >  01/11/2018   150> 02/17/2018  150     10/10/14 160  lb (72.576 kg)  09/16/14 156 lb (70.761 kg)  09/04/14 160 lb 13.6 oz (72.96 kg)      Vital signs reviewed  03/29/2019  - Note at rest 02 sats  99% on 3lpm cont    HEENT : pt wearing mask not removed for exam due to covid -19 concerns.    NECK :  without JVD/Nodes/TM/ nl carotid upstrokes bilaterally   LUNGS: no acc muscle use,  Mild barrel  contour chest wall with bilateral  Distant bs s audible wheeze and  without cough on insp or exp maneuvers  and mild   Hyperresonant  to  percussion bilaterally     CV:  RRR  no s3 or murmur or increase in P2, and no edema   ABD:  soft and nontender with pos mid insp Hoover's  in the supine position. No bruits or organomegaly appreciated, bowel sounds nl  MS:   Nl gait/  ext warm without deformities, calf tenderness, cyanosis or clubbing No obvious joint restrictions   SKIN: warm and dry without lesions    NEURO:  alert, approp, nl sensorium with  no motor or cerebellar deficits apparent.           Assessment:

## 2019-04-01 ENCOUNTER — Encounter: Payer: Self-pay | Admitting: Internal Medicine

## 2019-04-01 NOTE — Assessment & Plan Note (Signed)
Quit smoking 08/2014 - PFT's 12/09/14   FEV1 0.75 (44 % ) ratio 62  p no % improvement from saba p ? prior to study with DLCO  24 % corrects to 48 % for alv volume    - 12/09/2014 referred to rehab > started 12/2014  - 04/25/2015   try BEVESPI  - 06/20/2015  Changed by insurance> back on symb 160  - 09/22/2015 not happy with symbicort > changed back to besvespi 2bid > not happy with bevespi > changed back to symb 160 2bid and consider adding spiriva next vs trelegy trial   - 02/17/2018  After extensive coaching inhaler device,  effectiveness =    50% with hfa and 90% with elipta so rec changed to trelegy   - 05/08/2018 added pred x 6 days as "plan D"  - 01/15/2019 changed pred to 20 mg until better then 10 mg daily  - 03/29/2019  After extensive coaching inhaler device,  effectiveness =    50% with hfa    Group D in terms of symptom/rdsk and laba/lama/ICS  therefore appropriate rx at this point >>>  Continue trelegy each am and consider changing saba to  Dpi also as does poorly with hfa but has neb saba as backup (plan C)  The goal with a chronic steroid dependent illness is always arriving at the lowest effective dose that controls the disease/symptoms and not accepting a set "formula" which is based on statistics or guidelines that don't always take into account patient  variability or the natural hx of the dz in every individual patient, which may well vary over time.  For now therefore I recommend the patient maintain  Ceiling of 20 mg and floor of 10 mg daily for now

## 2019-04-01 NOTE — Assessment & Plan Note (Signed)
Sinus CT 10/31/2014>  No sinusitis is seen  - Allergy profile 10/19/2017 >  Eos 0.1 /  IgE 67  RAST neg  - Repeat Sinus CT 11/30/17 >  Min thickening - ENT eval Janace Hoard 12/07/17 :  Non-specific crusting only ? Related to 02 irritation  - 02/12/2019 rec trial of dymista > f/u ent prn   This may be why 02 not adequate by nasal rx > referred back to ENT

## 2019-04-01 NOTE — Assessment & Plan Note (Signed)
Started on 02 2lpm at d/c 09/04/14  - 10/28/2014   Walked RA x one lap @ 185 stopped due to  desat to 82%  at nl pace so rec 02 2lpm with walking and sleeping  - 12/09/2014 referred for POC > did not qualify - 08/17/2016 Patient Saturations on Room Air at Rest = 85%---increased to 93% on 2lpm o2 - 04/18/2017  Walking across exam room sats dropped into 80's on 2lpm  - 11/16/2017   Walked RA x one lap @ 185 stopped due to 87% and even on 5lpm could not maintain sats at nl pace  -  01/11/2018 humidfy 02  Added > not using as of 05/08/2018 > rec she do so > improved on humidity - 03/29/2019  Required up to 6lpm with activity to complete about 200 ft walking   As of  03/29/2019   rx  2lpm hs titrate to lowest level at rest and highest level with activity to maintain sats of > 90% but not irritate her nasal membranes with unneccessarily high flows if not needed but will need best fit by DME for amb 02 if continues to desat at < 6lpm           Each maintenance medication was reviewed in detail including emphasizing most importantly the difference between maintenance and prns and under what circumstances the prns are to be triggered using an action plan format where appropriate.  Total time for H and P, chart review, counseling, teaching device/ directly observing portions of ambulatory 02 saturation study/  and generating customized AVS unique to this office visit / charting = 30 min

## 2019-06-29 ENCOUNTER — Telehealth: Payer: Medicare PPO | Admitting: Internal Medicine

## 2019-07-03 ENCOUNTER — Telehealth: Payer: Medicare PPO | Admitting: Internal Medicine

## 2019-07-23 ENCOUNTER — Other Ambulatory Visit: Payer: Self-pay

## 2019-07-23 ENCOUNTER — Encounter: Payer: Self-pay | Admitting: Primary Care

## 2019-07-23 ENCOUNTER — Ambulatory Visit (INDEPENDENT_AMBULATORY_CARE_PROVIDER_SITE_OTHER): Payer: Medicare PPO | Admitting: Primary Care

## 2019-07-23 DIAGNOSIS — J9611 Chronic respiratory failure with hypoxia: Secondary | ICD-10-CM | POA: Diagnosis not present

## 2019-07-23 DIAGNOSIS — J441 Chronic obstructive pulmonary disease with (acute) exacerbation: Secondary | ICD-10-CM

## 2019-07-23 NOTE — Progress Notes (Signed)
Virtual Visit via Telephone Note  I connected with Suzanne Dickson on 07/23/19 at  4:00 PM EDT by telephone and verified that I am speaking with the correct person using two identifiers.  Location: Patient: Home Provider: Office   I discussed the limitations, risks, security and privacy concerns of performing an evaluation and management service by telephone and the availability of in person appointments. I also discussed with the patient that there may be a patient responsible charge related to this service. The patient expressed understanding and agreed to proceed.  History of Present Illness: 74 year old female, former smoker. PMH significant for COPD GOLD III, chronic respiratory failure, OSA . Patient of Dr. Melvyn Novas, last seen in March 2021. Dymista nasal spray added at last visit. Oxygen company is [employer]. CT sinuses in November 2019 showed min thickening. She saw Dr. Janace Hoard with ENT following imaging, thickening felt to be non-specific.    07/23/2019 Patient contacted today by telephone for virtual visit. She continues with Trelegy 100 daily. States that this medication will last most of the day but come evening she has a little bit more shortness of breath. Cough is improved. Her sinusitis symptoms are intermittent. She reports eye watering and post nasal drip. She continues dymista nasal spray, Zyrtec 10mg  daily, mucinex twice daily. She is on chronic prednisone 10mg  daily. She uses her albuterol rescue inhaler 1-4 times a day. However, there are days that she does not need to use it at all. She has not needed to use her nebulizer. She uses oxygen 2-4L oxygen at home, she required 6L during her last visit. Needs larger oxygen tank, states that her current tank only goes up to 2L/MIN. She is interested in Marine scientist. Denies fever, chills, cough, n/v/d.   Observations/Objective:  - No overt shortness of breath, wheezing or cough  Imaging: 02/26/19 HRCT - unchanged bandlike post  treatment fibrotic change and volume loss of the perihilar left lung with adjacent fiducial markers. Unchanged bandlike scarring of the medial right upper lobe and medial segment right middle lobe. No evidence of recurrent malignancy or lymphadenopathy. Emphysema. Coronary artery disease.  Aortic Atherosclerosis. Nonobstructive left nephrolithiasis.  PFTs: 12/09/14- FEV1 0.75 (44 % ) ratio 62  p no % improvement from saba p ? prior to study with DLCO  24 % corrects to 48 % for alv volume    Assessment and Plan:  COPD GOLD III: - Stable interval; Group D in terms of symptoms - Continue Trelegy 100 one puff daily; Albuterol hfa/nebulizer as needed for breakthrough shortness of breath or wheezing - Continue prednisone 10mg  daily   Chronic respiratory failure with hypoxia: - Required 6L oxygen with activity during last office visit in March 2021 - Needs qualifying walk for POC and larger oxygen tanks to accommodate up to 6L - Continue 4L at rest; 4-6L on exertion to keep O2 >88-90%  Chronic sinusitis: - Cough has improved  - Continue Dymista nasal spray and Zyrtec 10mg  daily  - FU ENT prn   Follow Up Instructions:  - 3 months OV with Dr. Melvyn Novas   I discussed the assessment and treatment plan with the patient. The patient was provided an opportunity to ask questions and all were answered. The patient agreed with the plan and demonstrated an understanding of the instructions.   The patient was advised to call back or seek an in-person evaluation if the symptoms worsen or if the condition fails to improve as anticipated.  I provided 22 minutes of non-face-to-face  time during this encounter.   Martyn Ehrich, NP

## 2019-07-23 NOTE — Patient Instructions (Addendum)
COPD: Continue Trelegy 1 puff daily in the morning (rinse mouth after use) Continue prednisone 10mg  daily  Chronic respiratory failure: Continue 4L oxygen at rest; use 4-6L on exertion to keep O2 >88-90%  Orders: Needs qualifying walk for POC with DME/ [facility] Needs larger Oxygen tanks 4-6L continuous   Follow-up: 3 months with Dr. Melvyn Novas   Home Oxygen Use, Adult When a medical condition keeps you from getting enough oxygen, your health care provider may instruct you to take extra oxygen at home. Your health care provider will let you know:  When to take oxygen.  For how long to take oxygen.  How quickly oxygen should be delivered (flow rate), in liters per minute (LPM or L/M). Home oxygen can be given through:  A mask.  A nasal cannula. This is a device or tube that goes in the nostrils.  A transtracheal catheter. This is a small, flexible tube placed in the trachea.  A tracheostomy. This is a surgically made opening in the trachea. These devices are connected with tubing to an oxygen source, such as:  A tank. Tanks hold oxygen in gas form. They must be replaced when the oxygen is used up.  A liquid oxygen device. This holds oxygen in liquid form. It must be replaced when the oxygen is used up.  An oxygen concentrator machine. This filters oxygen in the room. It uses electricity, so you must have a backup cylinder of oxygen in case the power goes out. Supplies needed: To use oxygen, you will need:  A mask, nasal cannula, transtracheal catheter, or tracheostomy.  An oxygen tank, a liquid oxygen device, or an oxygen concentrator.  The tape that your health care provider recommends (optional). If you use a transtracheal catheter and your prescribed flow rate is 1 LPM or greater, you will also need a humidifier. Risks and complications  Fire. This can happen if the oxygen is exposed to a heat source, flame, or spark.  Injury to skin. This can happen if liquid oxygen  touches your skin.  Organ damage. This can happen if you get too little oxygen. How to use oxygen Your health care provider or a representative from your [place] will show you how to use your oxygen device. Follow her or his instructions. The instructions may look something like this: 1. Wash your hands. 2. If you use an oxygen concentrator, make sure it is plugged in. 3. Place one end of the tube into the port on the tank, device, or machine. 4. Place the mask over your nose and mouth. Or, place the nasal cannula and secure it with tape if instructed. If you use a tracheostomy or transtracheal catheter, connect it to the oxygen source as directed. 5. Make sure the liter-flow setting on the machine is at the level prescribed by your health care provider. 6. Turn on the machine or adjust the knob on the tank or device to the correct liter-flow setting. 7. When you are done, turn off and unplug the machine, or turn the knob to OFF. How to clean and care for the oxygen supplies Nasal cannula  Clean it with a warm, wet cloth daily or as needed.  Wash it with a liquid soap once a week.  Rinse it thoroughly once or twice a week.  Replace it every 2-4 weeks.  If you have an infection, such as a cold or pneumonia, change the cannula when you get better. Mask  Replace it every 2-4 weeks.  If you  have an infection, such as a cold or pneumonia, change the mask when you get better. Humidifier bottle  Wash the bottle between each refill: ? Wash it with soap and warm water. ? Rinse it thoroughly. ? Disinfect it and its top. ? Air-dry it.  Make sure it is dry before you refill it. Oxygen concentrator  Clean the air filter at least twice a week according to directions from your home medical equipment and service company.  Wipe down the cabinet every day. To do this: ? Unplug the unit. ? Wipe down the cabinet with a damp cloth. ? Dry the cabinet. Other equipment  Change  any extra tubing every 1-3 months.  Follow instructions from your health care provider about taking care of any other equipment. Safety tips Fire safety tips   Keep your oxygen and oxygen supplies at least 5 ft away from sources of heat, flames, and sparks at all times.  Do not allow smoking near your oxygen. Put up "no smoking" signs in your home. Avoid smoking areas when in public.  Do not use materials that can burn (are flammable) while you use oxygen.  When you go to a restaurant with portable oxygen, ask to be seated in the nonsmoking section.  Keep a Data processing manager close by. Let your fire department know that you have oxygen in your home.  Test your home smoke detectors regularly. Traveling  Secure your oxygen tank in the vehicle so that it does not move around. Follow instructions from your medical device company about how to safely secure your tank.  Make sure you have enough oxygen for the amount of time you will be away from home.  If you are planning air travel, contact the airline to find out if they allow the use of an approved portable oxygen concentrator. You may also need documents from your health care provider and medical device company before you travel. General safety tips  If you use an oxygen cylinder, make sure it is in a stand or secured to an object that will not move (fixed object).  If you use liquid oxygen, make sure its container is kept upright.  If you use an oxygen concentrator: ? [employer] movement psychotherapist company. Make sure you are given priority service in the event that your power goes out. ? Avoid using extension cords, if possible. Follow these instructions at home:  Use oxygen only as told by your health care provider.  Do not use alcohol or other drugs that make you relax (sedating drugs) unless instructed. They can slow down your breathing rate and make it hard to get in enough oxygen.  Know how and when to order a refill of  oxygen.  Always keep a spare tank of oxygen. Plan ahead for holidays when you may not be able to get a prescription filled.  Use water-based lubricants on your lips or nostrils. Do not use oil-based products like petroleum jelly.  To prevent skin irritation on your cheeks or behind your ears, tuck some gauze under the tubing. Contact a health care provider if:  You get headaches often.  You have shortness of breath.  You have a lasting cough.  You have anxiety.  You are sleepy all the time.  You develop an illness that affects your breathing.  You cannot exercise at your regular level.  You are restless.  You have difficult or irregular breathing, and it is getting worse.  You have a fever.  You have persistent redness under  your nose. Get help right away if:  You are confused.  You have blue lips or fingernails.  You are struggling to breathe. Summary  Your health care provider or a representative from your [place] will show you how to use your oxygen device. Follow her or his instructions.  If you use an oxygen concentrator, make sure it is plugged in.  Make sure the liter-flow setting on the machine is at the level prescribed by your health care provider.  Keep your oxygen and oxygen supplies at least 5 ft away from sources of heat, flames, and sparks at all times. This information is not intended to replace advice given to you by your health care provider. Make sure you discuss any questions you have with your health care provider. Document Revised: 06/30/2017 Document Reviewed: 08/05/2015 Elsevier Patient Education  [place].    Chronic Obstructive Pulmonary Disease Chronic obstructive pulmonary disease (COPD) is a long-term (chronic) lung problem. When you have COPD, it is hard for air to get in and out of your lungs. Usually the condition gets worse over time, and your lungs will never return to normal. There are things you can do  to keep yourself as healthy as possible.  Your doctor may treat your condition with: ? Medicines. ? Oxygen. ? Lung surgery.  Your doctor may also recommend: ? Rehabilitation. This includes steps to make your body work better. It may involve a team of specialists. ? Quitting smoking, if you smoke. ? Exercise and changes to your diet. ? Comfort measures (palliative care). Follow these instructions at home: Medicines  Take over-the-counter and prescription medicines only as told by your doctor.  Talk to your doctor before taking any cough or allergy medicines. You may need to avoid medicines that cause your lungs to be dry. Lifestyle  If you smoke, stop. Smoking makes the problem worse. If you need help quitting, ask your doctor.  Avoid being around things that make your breathing worse. This may include smoke, chemicals, and fumes.  Stay active, but remember to rest as well.  Learn and use tips on how to relax.  Make sure you get enough sleep. Most adults need at least 7 hours of sleep every night.  Eat healthy foods. Eat smaller meals more often. Rest before meals. Controlled breathing Learn and use tips on how to control your breathing as told by your doctor. Try:  Breathing in (inhaling) through your nose for 1 second. Then, pucker your lips and breath out (exhale) through your lips for 2 seconds.  Putting one hand on your belly (abdomen). Breathe in slowly through your nose for 1 second. Your hand on your belly should move out. Pucker your lips and breathe out slowly through your lips. Your hand on your belly should move in as you breathe out.  Controlled coughing Learn and use controlled coughing to clear mucus from your lungs. Follow these steps: 1. Lean your head a little forward. 2. Breathe in deeply. 3. Try to hold your breath for 3 seconds. 4. Keep your mouth slightly open while coughing 2 times. 5. Spit any mucus out into a tissue. 6. Rest and do the steps again  1 or 2 times as needed. General instructions  Make sure you get all the shots (vaccines) that your doctor recommends. Ask your doctor about a flu shot and a pneumonia shot.  Use oxygen therapy and pulmonary rehabilitation if told by your doctor. If you need home oxygen therapy, ask your  doctor if you should buy a tool to measure your oxygen level (oximeter).  Make a COPD action plan with your doctor. This helps you to know what to do if you feel worse than usual.  Manage any other conditions you have as told by your doctor.  Avoid going outside when it is very hot, cold, or humid.  Avoid people who have a sickness you can catch (contagious).  Keep all follow-up visits as told by your doctor. This is important. Contact a doctor if:  You cough up more mucus than usual.  There is a change in the color or thickness of the mucus.  It is harder to breathe than usual.  Your breathing is faster than usual.  You have trouble sleeping.  You need to use your medicines more often than usual.  You have trouble doing your normal activities such as getting dressed or walking around the house. Get help right away if:  You have shortness of breath while resting.  You have shortness of breath that stops you from: ? Being able to talk. ? Doing normal activities.  Your chest hurts for longer than 5 minutes.  Your skin color is more blue than usual.  Your pulse oximeter shows that you have low oxygen for longer than 5 minutes.  You have a fever.  You feel too tired to breathe normally. Summary  Chronic obstructive pulmonary disease (COPD) is a long-term lung problem.  The way your lungs work will never return to normal. Usually the condition gets worse over time. There are things you can do to keep yourself as healthy as possible.  Take over-the-counter and prescription medicines only as told by your doctor.  If you smoke, stop. Smoking makes the problem worse. This information is  not intended to replace advice given to you by your health care provider. Make sure you discuss any questions you have with your health care provider. Document Revised: 12/24/2016 Document Reviewed: 02/16/2016 Elsevier Patient Education  2020 [employer].

## 2019-07-24 ENCOUNTER — Telehealth: Payer: Self-pay | Admitting: Primary Care

## 2019-07-24 DIAGNOSIS — J441 Chronic obstructive pulmonary disease with (acute) exacerbation: Secondary | ICD-10-CM

## 2019-07-24 DIAGNOSIS — J449 Chronic obstructive pulmonary disease, unspecified: Secondary | ICD-10-CM

## 2019-07-24 NOTE — Telephone Encounter (Signed)
Spoke with [facility], patient does not qualify for a POC if she needs 4-6 liters of oxygen. Patient will need a qualifying walk and a new order so she can get the concentrator for higher flow levels. Qualifying walk scheduled and order placed to DME for the 10 liter oxygen concentrator tank. (requested by [facility]) Patient updated on situation.

## 2019-08-02 ENCOUNTER — Other Ambulatory Visit: Payer: Self-pay

## 2019-08-02 ENCOUNTER — Ambulatory Visit: Payer: Medicare PPO

## 2019-10-13 ENCOUNTER — Other Ambulatory Visit: Payer: Self-pay | Admitting: Internal Medicine

## 2019-10-15 ENCOUNTER — Other Ambulatory Visit: Payer: Self-pay | Admitting: Internal Medicine

## 2019-10-15 NOTE — Telephone Encounter (Signed)
Unsure as to whether or not to refill prednisone. Dr Melvyn Novas, please advise.

## 2019-10-16 ENCOUNTER — Other Ambulatory Visit: Payer: Self-pay | Admitting: Internal Medicine

## 2019-11-16 ENCOUNTER — Telehealth: Payer: Self-pay | Admitting: Internal Medicine

## 2019-11-16 MED ORDER — ALBUTEROL SULFATE HFA 108 (90 BASE) MCG/ACT IN AERS: 2.0000 | INHALATION_SPRAY | RESPIRATORY_TRACT | 5 refills | Status: DC | PRN

## 2019-11-16 NOTE — Telephone Encounter (Signed)
rx for pt's rescue inhaler has been sent to preferred pharmacy for pt. Called and spoke with pt letting her know this had been done and she verbalized understanding. Also stated to pt that we needed to get her scheduled for a f/u appt with MW. appt has been scheduled for pt 11/8. Nothing further needed.

## 2019-12-03 ENCOUNTER — Other Ambulatory Visit: Payer: Self-pay

## 2019-12-03 ENCOUNTER — Encounter: Payer: Self-pay | Admitting: Internal Medicine

## 2019-12-03 ENCOUNTER — Ambulatory Visit (INDEPENDENT_AMBULATORY_CARE_PROVIDER_SITE_OTHER): Payer: Medicare PPO | Admitting: Internal Medicine

## 2019-12-03 DIAGNOSIS — J449 Chronic obstructive pulmonary disease, unspecified: Secondary | ICD-10-CM | POA: Diagnosis not present

## 2019-12-03 DIAGNOSIS — J9611 Chronic respiratory failure with hypoxia: Secondary | ICD-10-CM | POA: Diagnosis not present

## 2019-12-03 NOTE — Progress Notes (Signed)
Subjective:     Patient ID: Suzanne Dickson, female   DOB: 06-Jun-1945     MRN: [id number]    Brief patient profile:  71 yobf quit smoking on admit 09/02/14  and proved to have GOLD III copd 11/2014     History of Present Illness  09/22/2015  f/u ov/Suzanne Dickson re: copd III/ symb 1602bid on 2lpm at rest/ 4lpm ex  Chief Complaint  Patient presents with  . Follow-up    [place] had hemoptysis and increased SOB. She took round pred and augmentin and symptoms are some better. She has noticed minimal wheezing at night. She has used neb 3 x over the past 2 wks.   liked bevespi better but wasn't covered then and not doing as well on the symbicort though hfa not ideal  - see a/p rec Plan A = Automatic =stop symbicort and restart Bevespi Take 2 puffs first thing in am and then another 2 puffs about 12 hours later.  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 Acute Visit    Breathing has been worse x 2 wks. She has been producing some yellow sputum over the past 2 days. She states also wheezing. She has been using her albuterol inhaler 3 x per day and she has not used neb.   Dyspnea:  MMRC3 = baseline can't walk 100 yards even at a slow pace at a flat grade s stopping due to sob on 2lpm but sometimes on RA now worse 2 weeks assoc with watery rhinitis no better with zyrtec  Cough:  more productive x 2 weeks assoc with pnds but min yellow  Sleeping: 2lpm  Bed flat and 2 big pillows  SABA use: not usually needing but now tid  02: 2lpm baseline now up to 3lpm  rec Prednisone 10 mg take  4 each am x 2 days,   2 each am x 2 days,  1 each am x 2 days and stop  Plan A = Automatic = symbicort 160 Take 2 puffs first thing in am and then another 2 puffs about 12 hours later.  Plan B = Backup Only use your albuterol as a rescue medication Plan C = Crisis - only use your albuterol nebulizer if you first try Plan B and it fails to help > ok to use the nebulizer up to every 4 hours but if start needing it regularly call for immediate appointment Goal for 02 is to keep saturation over 90%  Please schedule a follow up office visit in 4 weeks, sooner if needed  with all medications /inhalers/ solutions in hand so we can verify exactly what you are taking. This includes all medications from all doctors and over the counters    11/16/2017  f/u  ov/Suzanne Dickson re: copd III criteria but 02 dep/ did not bring all meds as req  Chief Complaint  Patient presents with  . Follow-up    Breathing better but back quite back to baseline. She still has rhinitis and PND. She is using her albuterol inhaler 8-10 x per wk.  She rarely uses her neb.   Dyspnea: .MMRC3 = can't walk 100 yards even at a slow pace at a flat grade s stopping due to sob  On RA or  3lpm  - not monitoring sats as rec  Cough: none  Sleeping: ok 1pillow / bed flat SABA use: as above  02: 2lpm hs / prn daytime  rec Prednisone 10 mg take  4 each am x 2 days,   2 each am x 2 days,  1 each am x 2 days and stop  - add  No 02 needed at rest, max 02 when walking more than room to room at home to prevent desats ENT eval  > [facility] saw 12/07/17 dx ? 02 irritation     12/27/17 phone care for flair of nasal congestion purulent bronchitis: Augmentin 875 mg take one pill twice daily  X 10 days  Prednisone 10 mg take  4 each am x 2 days,   2 each  am x 2 days,  1 each am x 2 days and stop     01/11/2018  f/u ov/Suzanne Dickson re: gold III copd / still struggling with how/ when to use 02 which is irritating her nose Chief Complaint  Patient presents with  . Follow-up    COPD GOLD III/O2 dep  Dyspnea:  MMRC4  = sob if tries to leave home or while getting dressed   Cough: x 2 days hs coughing fits  Sleeping: bed flat, 1-2 pillow  SABA use: on prednisone no need 02: 2lpm hs not humidified   rec Need to humidify your home 02 per [facility] Goal for your 02 is to keep saturations over 90 so don't use it at rest unless you need  Elevate the head of your bed on 6-8 inch blocks For cough > mucinex dm up to 1200 mg every 12 hours as needed If get worse >  Prednisone 10 mg take  4 each am x 2 days,   2 each am x 2 days,  1 each am x 2 days and stop      02/17/2018  f/u ov/Suzanne Dickson re: gold III criteria but 02 dep, doing better with nasal symptoms on humidified 02  Chief Complaint  Patient presents with  . Follow-up    Breathing is some better. She has some nasal congestion in the am- clear, bloody nasal d/c. She is using her proair 2-3 x per wk on average.   Dyspnea:  Better vs priors/ pred helped nasal symptoms a lot and also doe  Cough: no Sleeping: 2 pillows/ bed blocks on order  SABA use: a little more since stopped prednisone  02: 2lpm 24/7 but up to 4 active rec Plan A = Automatic = Trelegy one click first thing each am Plan B = Backup Only use your albuterol as a rescue medication Plan C = Crisis - only use your albuterol nebulizer if you first try Plan B and it fails to help > ok to use the nebulizer up to every 4 hours but if start needing it regularly call for immediate appointmentPlan D = Deltasone= Prednisone - if needing the nebulizer more than twice daily then start prednisone :  Prednisone 10 mg take  4 each am x 2 days,   2 each am x 2 days,  1 each am x 2 days and stop   Use humdified 02 and adjust to sats overy 90% during the day  2 lpm sitting  Sleeping and 4lpm    televist 10/06/2018 rec Remember prednisone is refillable and can be used when you are losing ground with your breathing and using more of your nebulizer than usual as Plan D below   Plan A = Automatic = Trelegy one click first thing each am Plan B = Backup Only use your albuterol as a rescue medication  Plan C = Crisis - only use your albuterol nebulizer if you first try Plan B and it fails to help > ok to use the nebulizer up to every 4 hours but if start needing it regularly call for immediate appointment Plan D = Deltasone= Prednisone - if needing the nebulizer more than twice daily then start prednisone : Prednisone 10 mg take  4 each am x 2 days,   2 each am x 2 days,  1 each am x 2 days and stop     02/12/19 Televist Dymista two puffs in each nostril at bedtime and if not better see Dr Janace Hoard  Continue zyrtec and pepcid at bedtime    03/29/2019  f/u ov/Suzanne Dickson re:  GOLD II/ 02 dep  On dymista 2 pffs hs / off pred now  Last took it one week prior to [place]  Patient presents with  . Follow-up    Breathing is overall doing well. She has occ wheezing at night. She is using her albuterol inhaler about 8 x per wk and rarely uses neb.   Dyspnea:  Room to room but 'fine if stays sitting"  = "helps me breathe better" (ie very poor insight into the goals of resp rx to improve activity tol) Cough: assoc with some wheezing /watery rhinits no better even  on prednisone/dymista Sleeping:on a wedge x 45 degrees  SABA use: rarely at hs ? Helps some  02: 4lpm with activity  And 2-3 lpm at hs  rec Make sure you check your oxygen saturations at highest level of activity  Try albuterol 15 min before an activity that you know would make you short of breath and see if it makes any difference and if makes none then don't take it after activity unless you can't catch your breath. See your ENT doctor regarding your nasal congestion if dymista not  helping    NP rec  07/23/19  COPD: Continue Trelegy 1 puff daily in the morning (rinse mouth after use) Continue prednisone 10mg  daily  Chronic respiratory failure: Continue 4L oxygen at rest; use 4-6L on exertion to keep O2 >88-90%  Orders: Needs qualifying walk for POC with DME/ [facility] Needs larger Oxygen tanks 4-6L continuous    12/03/2019  f/u ov/Suzanne Dickson re:  GOLD II COPD / 02 dep  On Trelegy and pred 10mg  daily since august 2021 s flare  Chief Complaint  Patient presents with  . Follow-up    Breathing has improved some since the last visit. She is using her albuterol inhaler 4-6 x per wk and has not used neb recently.   Dyspnea:  Room to room, nothing outdoors  Cough: none Sleeping: wedge at 45 degrees insomnia but no resp issues  SABA use: as above 02: 4lpm 24 x 6 with walking but not checking sats  No obvious day to day or daytime variability or assoc excess/ purulent sputum or mucus plugs or hemoptysis or cp or chest tightness, subjective wheeze or overt sinus or hb symptoms.   sleeping without nocturnal  or early am exacerbation  of respiratory  c/o's or need for noct saba. Also denies any obvious fluctuation of symptoms with weather or environmental changes or other aggravating or alleviating factors except as outlined above   No unusual exposure hx or h/o childhood pna/ asthma or knowledge of premature birth.  Current Allergies, Complete Past Medical History, Past Surgical History, Family History, and Social History were reviewed in [employer] [employer] record.  ROS  The following are not active complaints unless bolded Hoarseness, sore throat, dysphagia, dental problems, itching, sneezing,  nasal congestion or discharge of excess mucus or purulent secretions, ear ache,   fever, chills, sweats, unintended wt loss or wt gain, classically pleuritic or exertional cp,  orthopnea pnd or arm/hand swelling  or leg swelling, presyncope, palpitations, abdominal  pain, anorexia, nausea, vomiting, diarrhea  or change in bowel habits or change in bladder habits, change in stools or change in urine, dysuria, hematuria,  rash, arthralgias, visual complaints, headache, numbness, weakness or ataxia or problems with walking/ uses rollator  or coordination,  change in mood or  memory.        Current Meds  Medication Sig  . albuterol (PROVENTIL) (2.5 MG/3ML) 0.083% nebulizer solution Take 3 mLs (2.5 mg total) by nebulization every 4 (four) hours as needed. For shortness of breath (Patient taking differently: Take 2.5 mg by nebulization every 4 (four) hours as needed for wheezing or shortness of breath. )  . albuterol (VENTOLIN HFA) 108 (90 Base) MCG/ACT inhaler Inhale 2 puffs into the lungs every 4 (four) hours as needed for wheezing or shortness of breath.  Marland Kitchen amLODipine (NORVASC) 5 MG tablet Take 1 tablet (5 mg total) by mouth daily. (Patient taking differently: Take 10 mg by mouth every morning. )  . aspirin EC 81 MG tablet Take 81 mg by mouth every morning.  . Azelastine-Fluticasone (DYMISTA) 137-50 MCG/ACT SUSP 2 pffs each nostril at bedtime  . cetirizine (ZYRTEC) 10 MG tablet Take 10 mg by mouth at bedtime.  . cholecalciferol (VITAMIN D3) 25 MCG (1000 UNIT) tablet Take 1,000 Units by mouth daily.  . clopidogrel (PLAVIX) 75 MG tablet Take 75 mg by mouth every morning.   . Cyanocobalamin (B-12 PO) Take 1 tablet by mouth daily.  . famotidine (PEPCID) 20 MG tablet One after supper  . ferrous sulfate 325 (65 FE) MG EC tablet Take 325 mg by mouth 3 (three) times daily with meals.   . Fluticasone-Umeclidin-Vilant (TRELEGY ELLIPTA) 100-62.5-25 MCG/INH AEPB Inhale 1 puff into the lungs daily.  Marland Kitchen guaiFENesin (MUCINEX) 600 MG 12 hr tablet Take 600 mg by mouth every 12 (twelve) hours.   Marland Kitchen ibuprofen (ADVIL) 200 MG tablet Take 400 mg by mouth every 6 (six) hours as needed (pain).  Marland Kitchen losartan (COZAAR) 50 MG tablet Take 50 mg by mouth every morning.  . metoprolol succinate  (TOPROL-XL) 25 MG 24 hr tablet Take 25 mg by mouth every morning.   . nitroGLYCERIN (NITRODUR - DOSED IN MG/24 HR) 0.4 mg/hr patch Place 0.4 mg onto the skin every morning.   . nitroGLYCERIN (NITROSTAT) 0.4 MG SL tablet Place 0.4 mg under the tongue every 5 (five) minutes as needed for chest pain.  Marland Kitchen OVER THE COUNTER MEDICATION Take 1 tablet by mouth at bedtime. OTC sleep  aid - unknown ingredients  . OXYGEN Inhale 2-4 L/min into the lungs See admin instructions. 2-3 lpm at rest and 4 lpm with exertion  . pantoprazole (PROTONIX) 40 MG tablet Take 30-60 min before first meal of the day (Patient taking differently: Take 40 mg by mouth daily before breakfast. )  . Polyvinyl Alcohol-Povidone (REFRESH OP) Place 1 drop into both eyes daily as needed (dry eyes).  . predniSONE (DELTASONE) 10 MG tablet TAKE 2 TABLETS BY MOUTH DAILY UNTIL FEELING BETTER. THEN TAKE 1 TABLET BY MOUTH ONCE DAILY  . sertraline (ZOLOFT) 100 MG tablet Take 100 mg by mouth 2 (two) times daily.              Objective:   Physical Exam   amb mod obese pleasant bf using rollator and e cylinder   12/03/2019   162 03/29/2019     164  08/08/2018   154 10/28/2014        160 > 12/09/2014 161 > 03/14/2015 158 >    04/25/2015  161 >  06/20/2015  158 > 09/22/2015   159 > 11/19/2015 159 > 12/23/2015   159  > 02/03/2016    163  > 05/18/2016   161 > 11/17/2016   154 > 01/07/2017  157 > 04/18/2017   148 > 07/19/2017  145 > 10/19/2017    150  > 11/16/2017   150 >  01/11/2018   150> 02/17/2018  150     10/10/14 160 lb (72.576 kg)  09/16/14 156 lb (70.761 kg)  09/04/14 160 lb 13.6 oz (72.96 kg)      Vital signs reviewed  12/03/2019  - Note at rest 02 sats  97% on 4lpm cont  HEENT : pt wearing mask not removed for exam due to covid -19 concerns.    NECK :  without JVD/Nodes/TM/ nl carotid upstrokes bilaterally   LUNGS: no acc muscle use,  Mod barrel  contour chest wall with bilateral  Distant bs s audible wheeze and  without cough on insp or exp  maneuvers and mod  Hyperresonant  to  percussion bilaterally     CV:  RRR  no s3 or murmur or increase in P2, and no edema   ABD:  Obese soft and nontender with pos mid insp Hoover's  in the supine position. No bruits or organomegaly appreciated, bowel sounds nl  MS:     ext warm without deformities, calf tenderness, cyanosis or clubbing No obvious joint restrictions   SKIN: warm and dry without lesions    NEURO:  alert, approp, nl sensorium with  no motor or cerebellar deficits apparent.         I personally reviewed images and agree with radiology impression as follows:  CXR:   w contrast 02/26/19 1. Unchanged bandlike post treatment fibrotic change and volume loss of the perihilar left lung with adjacent fiducial markers. Unchanged bandlike scarring of the medial right upper lobe and medial segment right middle lobe. No evidence of recurrent malignancy or lymphadenopathy. 2.  Emphysema (ICD10-J43.9). 3.  Coronary artery disease.  Aortic Atherosclerosis (ICD10-I70.0). 4.  Nonobstructive left nephrolithiasis.       Assessment:

## 2019-12-03 NOTE — Patient Instructions (Addendum)
Make sure you check your oxygen saturations at highest level of activity to make sure it stays over 90% and adjust  02 flow upward to maintain this level if needed but remember to turn it back to previous settings when you stop (to conserve your supply).   Prednisone ceiling is 20 mg per day until better, then 10 mg daily x one week, then 5 mg (one half of a 10 mg tablet)  - if get worse start over   Please schedule a follow up visit in 6  months but call sooner if needed

## 2019-12-03 NOTE — Assessment & Plan Note (Signed)
Quit smoking 08/2014 - PFT's 12/09/14   FEV1 0.75 (44 % ) ratio 62  p no % improvement from saba p ? prior to study with DLCO  24 % corrects to 48 % for alv volume    - 12/09/2014 referred to rehab > started 12/2014  - 04/25/2015   try BEVESPI  - 06/20/2015  Changed by insurance> back on symb 160  - 09/22/2015 not happy with symbicort > changed back to besvespi 2bid > not happy with bevespi > changed back to symb 160 2bid and consider adding spiriva next vs trelegy trial   - 02/17/2018  After extensive coaching inhaler device,  effectiveness =    50% with hfa and 90% with elipta so rec changed to trelegy   - 05/08/2018 added pred x 6 days as "plan D"  - 01/15/2019 changed pred to 20 mg until better then 10 mg daily  - 03/29/2019  After extensive coaching inhaler device,  effectiveness =    50% with hfa  - 12/03/2019 changed to ceiling of 20 mg pred/ floor of 5 mg per day   Group D in terms of symptom/risk and laba/lama/ICS  therefore appropriate rx at this point >>>  Much better this past year on pred plus trelegy   The goal with a chronic steroid dependent illness is always arriving at the lowest effective dose that controls the disease/symptoms and not accepting a set "formula" which is based on statistics or guidelines that don't always take into account patient  variability or the natural hx of the dz in every individual patient, which may well vary over time.  For now therefore I recommend the patient maintain  Ceiling of 20 mg and floor of 5 mg daily   Re saba: I spent extra time with pt today reviewing appropriate use of albuterol for prn use on exertion with the following points: 1) saba is for relief of sob that does not improve by walking a slower pace or resting but rather if the pt does not improve after trying this first. 2) If the pt is convinced, as many are, that saba helps recover from activity faster then it's easy to tell if this is the case by re-challenging : ie stop, take the inhaler,  then p 5 minutes try the exact same activity (intensity of workload) that just caused the symptoms and see if they are substantially diminished or not after saba 3) if there is an activity that reproducibly causes the symptoms, try the saba 15 min before the activity on alternate days   If in fact the saba really does help, then fine to continue to use it prn but advised may need to look closer at the maintenance regimen being used to achieve better control of airways disease with exertion.

## 2019-12-03 NOTE — Assessment & Plan Note (Addendum)
Started on 02 2lpm at d/c 09/04/14  - 10/28/2014   Walked RA x one lap @ 185 stopped due to  desat to 82%  at nl pace so rec 02 2lpm with walking and sleeping  - 12/09/2014 referred for POC > did not qualify - 08/17/2016 Patient Saturations on Room Air at Rest = 85%---increased to 93% on 2lpm o2 - 04/18/2017  Walking across exam room sats dropped into 80's on 2lpm  - 11/16/2017   Walked RA x one lap @ 185 stopped due to 87% and even on 5lpm could not maintain sats at nl pace  -  01/11/2018 humidfy 02  Added > not using as of 05/08/2018 > rec she do so > improved on humidity - 03/29/2019  Required up to 6lpm with activity to complete about 200 ft walking   As of 12/03/2019  Using 4lpm 24/7 but up to 6lpm walking   Advised:  Make sure you check your oxygen saturations at highest level of activity to be sure it stays over 90% and adjust  02 flow upward to maintain this level if needed but remember to turn it back to previous settings when you stop (to conserve your supply).          Each maintenance medication was reviewed in detail including emphasizing most importantly the difference between maintenance and prns and under what circumstances the prns are to be triggered using an action plan format where appropriate.  Total time for H and P, chart review, counseling, teaching devices and generating customized AVS unique to this office visit / charting = 24 min

## 2019-12-20 DIAGNOSIS — J42 Unspecified chronic bronchitis: Secondary | ICD-10-CM | POA: Diagnosis not present

## 2019-12-20 DIAGNOSIS — I10 Essential (primary) hypertension: Secondary | ICD-10-CM | POA: Diagnosis not present

## 2020-01-19 DIAGNOSIS — J42 Unspecified chronic bronchitis: Secondary | ICD-10-CM | POA: Diagnosis not present

## 2020-01-19 DIAGNOSIS — I10 Essential (primary) hypertension: Secondary | ICD-10-CM | POA: Diagnosis not present

## 2020-01-31 IMAGING — CT CT ANGIO CHEST
2 of 6 series · 18 of 36 positions shown · IV contrast (omnipaque)
Comparison: August 25, 2018.

CLINICAL DATA: Shortness of breath.

EXAM:
CT ANGIOGRAPHY CHEST WITH CONTRAST
TECHNIQUE: Multidetector CT imaging of the chest was performed using the
standard protocol during bolus administration of intravenous
contrast. Multiplanar CT image reconstructions and MIPs were
obtained to evaluate the vascular anatomy.
CONTRAST:  100mL OMNIPAQUE IOHEXOL 350 MG/ML SOLN

[Series 7: pe thins · axial · 0.73mm/px · z∈[+1025,+1273]mm · 17 of 394 slices shown]
[im 20/394  lung]
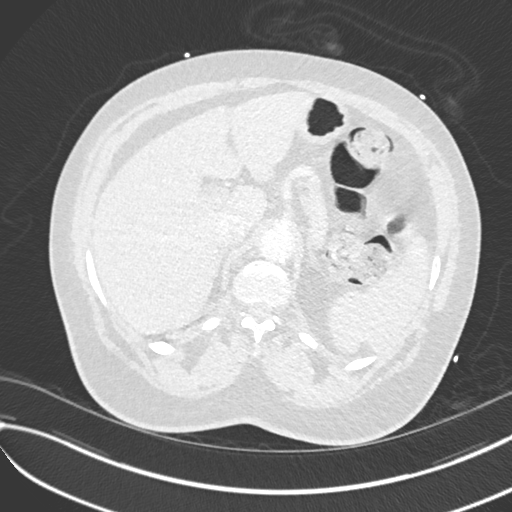
[im 40/394  mediastinal]
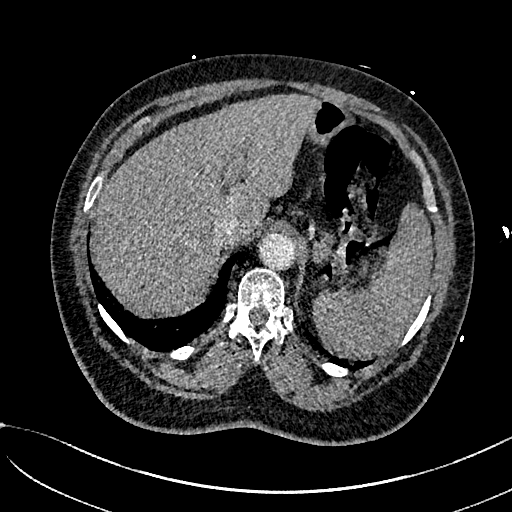
[im 59/394  lung]
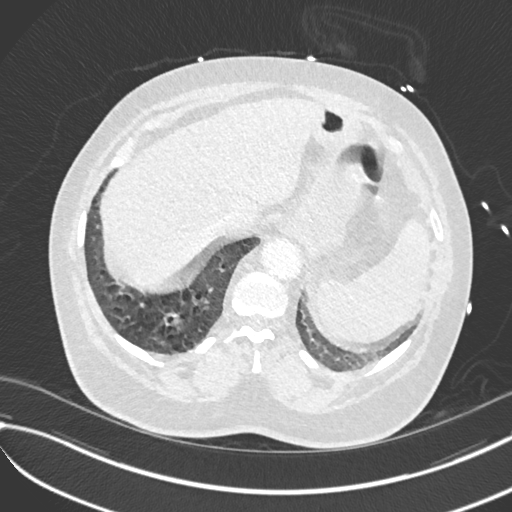
[im 79/394  mediastinal]
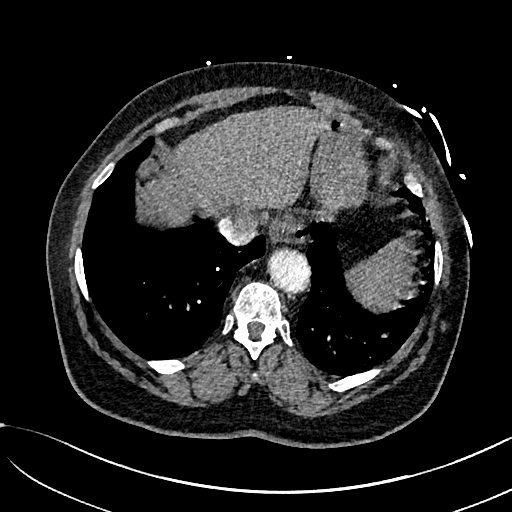
[im 118/394  lung]
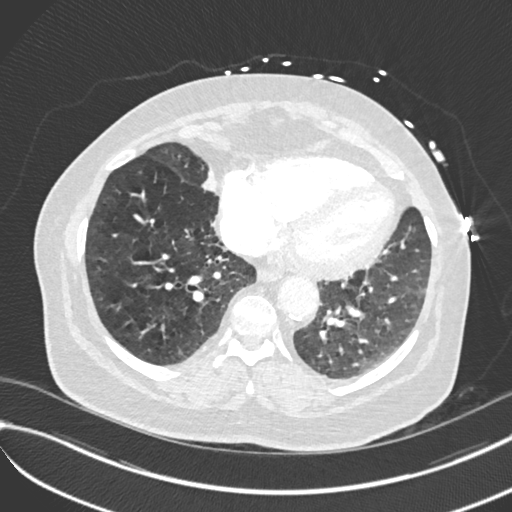
[im 138/394  mediastinal]
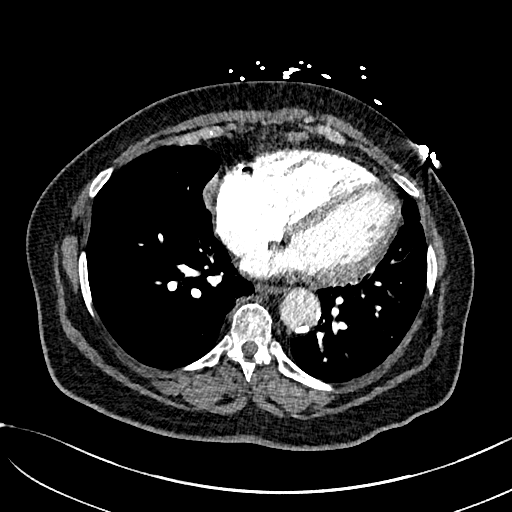
[im 158/394  lung]
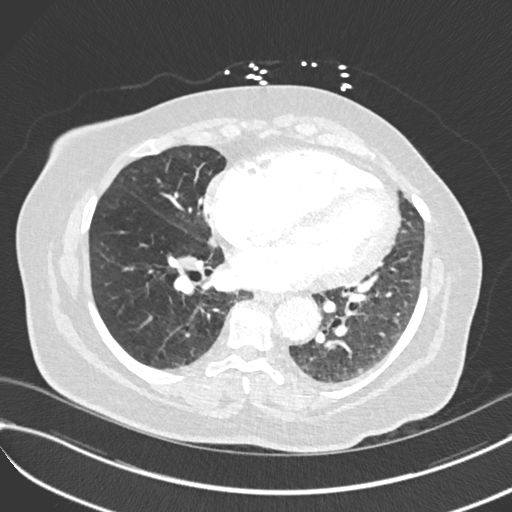
[im 177/394  mediastinal]
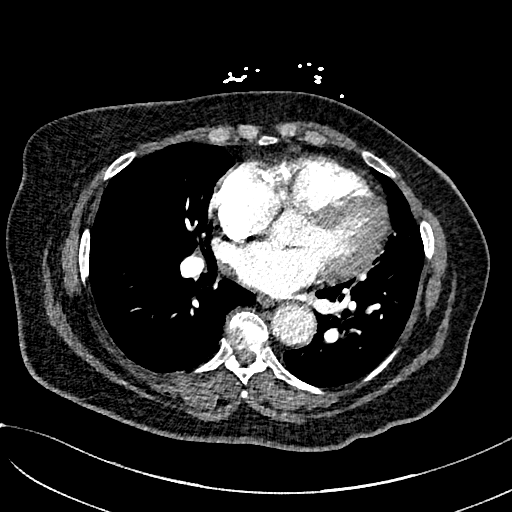
[im 197/394  lung]
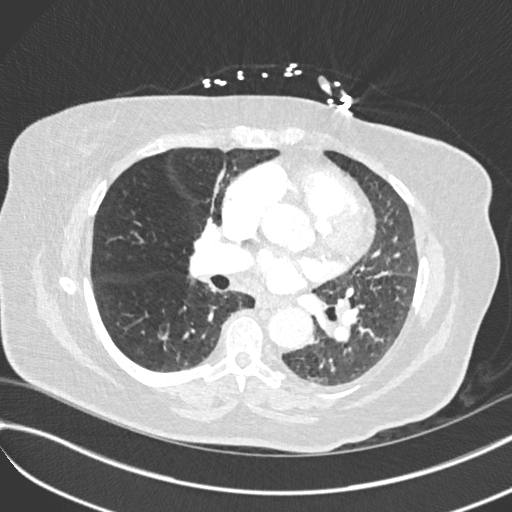
[im 217/394  mediastinal]
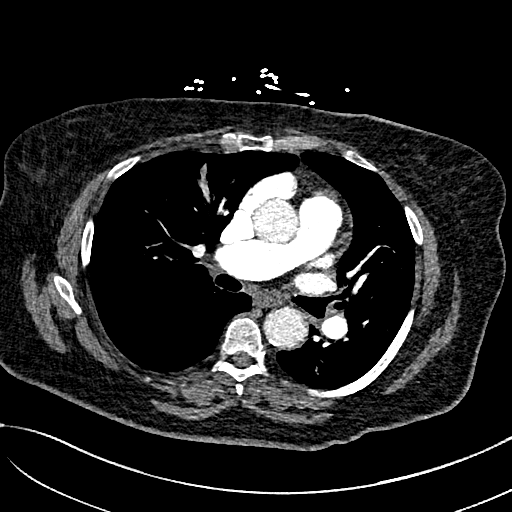
[im 236/394  lung]
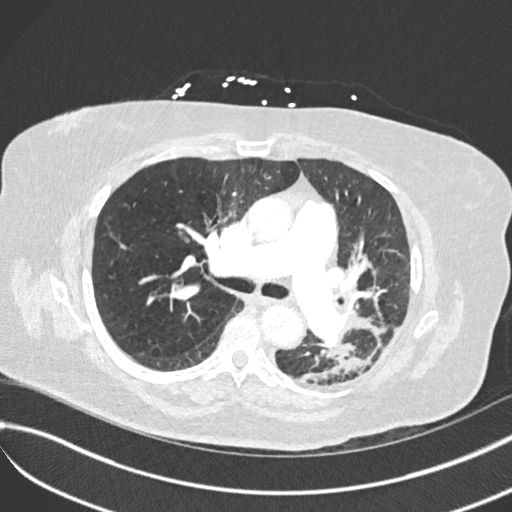
[im 256/394  mediastinal]
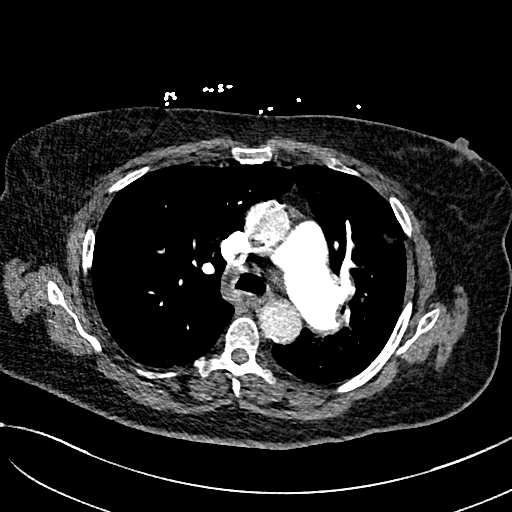
[im 276/394  lung]
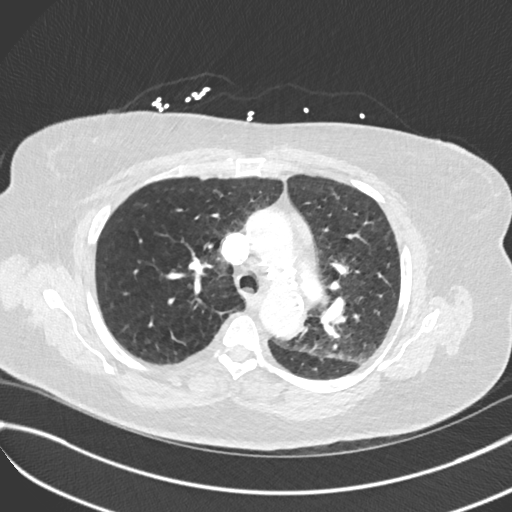
[im 315/394  mediastinal]
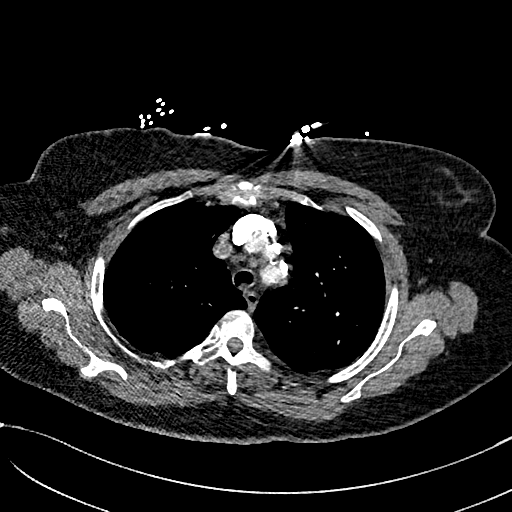
[im 335/394  lung]
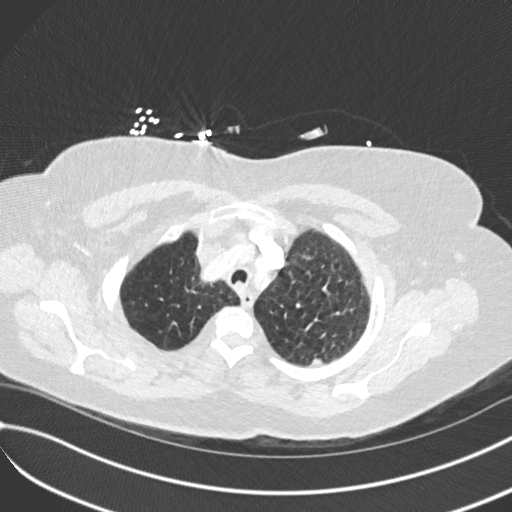
[im 354/394  mediastinal]
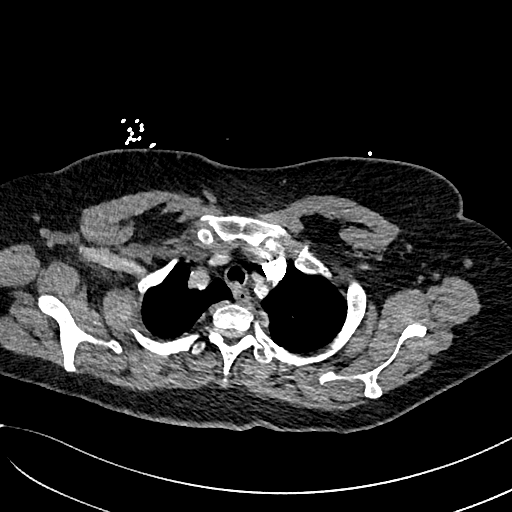
[im 374/394  lung]
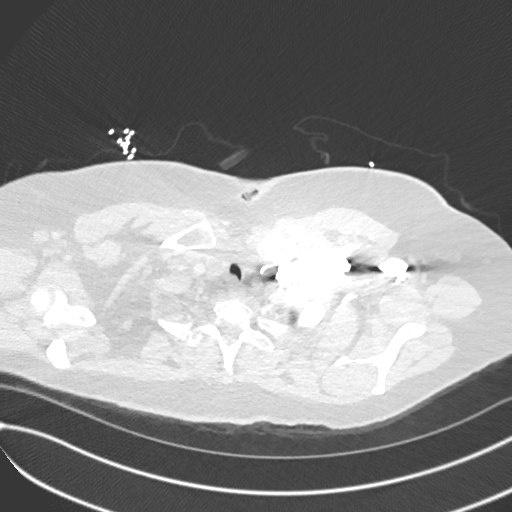

[Series 8: pe 2mm cor · coronal · 0.56mm/px · 1 of 151 slices shown]
[im 76/151  mediastinal]
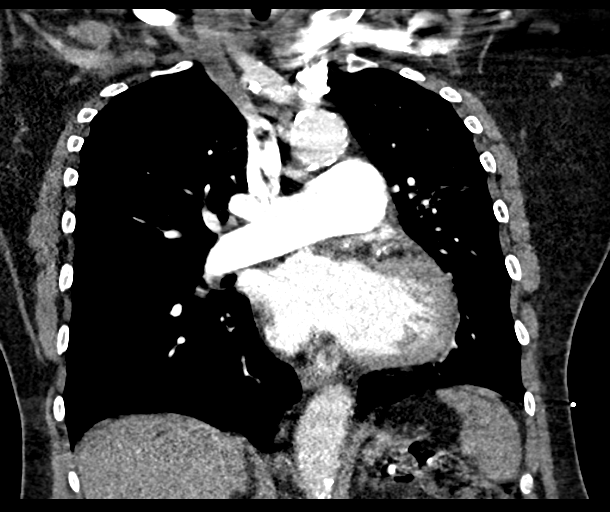

[18 of 36 positions shown; findings below may reference images not displayed]

FINDINGS: Cardiovascular: Satisfactory opacification of the pulmonary arteries
to the segmental level. No evidence of pulmonary embolism. Normal
heart size. No pericardial effusion. Coronary artery calcifications
are noted. Enlargement of pulmonary arteries are noted suggesting
pulmonary artery hypertension. Atherosclerosis of thoracic aorta is
noted without aneurysm formation.

Mediastinum/Nodes: Stable 1.7 cm right thyroid nodule. Esophagus is
unremarkable. No significant adenopathy is noted.

Lungs/Pleura: No pneumothorax or pleural effusion is noted. Stable
densities are noted in the left upper and lower lobes most
consistent with post radiation change. Stable irregular density is
noted anteriorly in the right upper lobe most consistent with
scarring or fibrosis. Right lower lobe nodule noted on prior exam is
not well visualized currently.

Upper Abdomen: No acute abnormality.

Musculoskeletal: No chest wall abnormality. No acute or significant
osseous findings.

Review of the MIP images confirms the above findings.
IMPRESSION: No definite evidence of pulmonary embolus.

Coronary artery calcifications are noted.

Enlargement of pulmonary arteries are noted suggesting pulmonary
artery hypertension.

Stable 1.7 cm right thyroid nodule.

Stable densities are noted in the left upper and lower lobes most
consistent with post radiation change. Stable right upper lobe
scarring or fibrosis is noted.

Aortic Atherosclerosis ([id number]).

## 2020-02-19 ENCOUNTER — Other Ambulatory Visit: Payer: Self-pay | Admitting: Internal Medicine

## 2020-02-19 DIAGNOSIS — J42 Unspecified chronic bronchitis: Secondary | ICD-10-CM | POA: Diagnosis not present

## 2020-02-19 DIAGNOSIS — I10 Essential (primary) hypertension: Secondary | ICD-10-CM | POA: Diagnosis not present

## 2020-02-25 ENCOUNTER — Inpatient Hospital Stay: Payer: Medicare PPO

## 2020-02-25 ENCOUNTER — Ambulatory Visit (HOSPITAL_COMMUNITY): Payer: Medicare PPO

## 2020-02-27 ENCOUNTER — Inpatient Hospital Stay: Payer: Medicare PPO | Admitting: Internal Medicine

## 2020-03-12 ENCOUNTER — Inpatient Hospital Stay: Payer: Medicare PPO | Attending: Internal Medicine

## 2020-03-12 ENCOUNTER — Ambulatory Visit (HOSPITAL_COMMUNITY)
Admission: RE | Admit: 2020-03-12 | Discharge: 2020-03-12 | Disposition: A | Discharge status: Home / Self Care | Payer: Medicare PPO | Source: Ambulatory Visit | Attending: Internal Medicine | Admitting: Internal Medicine

## 2020-03-12 ENCOUNTER — Other Ambulatory Visit: Payer: Self-pay

## 2020-03-12 DIAGNOSIS — Z9049 Acquired absence of other specified parts of digestive tract: Secondary | ICD-10-CM | POA: Diagnosis not present

## 2020-03-12 DIAGNOSIS — J8410 Pulmonary fibrosis, unspecified: Secondary | ICD-10-CM | POA: Diagnosis not present

## 2020-03-12 DIAGNOSIS — J449 Chronic obstructive pulmonary disease, unspecified: Secondary | ICD-10-CM | POA: Insufficient documentation

## 2020-03-12 DIAGNOSIS — Z923 Personal history of irradiation: Secondary | ICD-10-CM | POA: Diagnosis not present

## 2020-03-12 DIAGNOSIS — R0602 Shortness of breath: Secondary | ICD-10-CM | POA: Diagnosis not present

## 2020-03-12 DIAGNOSIS — Z951 Presence of aortocoronary bypass graft: Secondary | ICD-10-CM | POA: Diagnosis not present

## 2020-03-12 DIAGNOSIS — R5383 Other fatigue: Secondary | ICD-10-CM | POA: Insufficient documentation

## 2020-03-12 DIAGNOSIS — Z79899 Other long term (current) drug therapy: Secondary | ICD-10-CM | POA: Diagnosis not present

## 2020-03-12 DIAGNOSIS — R0609 Other forms of dyspnea: Secondary | ICD-10-CM | POA: Diagnosis not present

## 2020-03-12 DIAGNOSIS — C3412 Malignant neoplasm of upper lobe, left bronchus or lung: Secondary | ICD-10-CM | POA: Diagnosis not present

## 2020-03-12 DIAGNOSIS — J432 Centrilobular emphysema: Secondary | ICD-10-CM | POA: Diagnosis not present

## 2020-03-12 DIAGNOSIS — C3490 Malignant neoplasm of unspecified part of unspecified bronchus or lung: Secondary | ICD-10-CM | POA: Diagnosis not present

## 2020-03-12 LAB — CMP (CANCER CENTER ONLY)
ALT: 26 U/L (ref 0–44)
AST: 19 U/L (ref 15–41)
Albumin: 3.7 g/dL (ref 3.5–5.0)
Alkaline Phosphatase: 56 U/L (ref 38–126)
Anion gap: 11 (ref 5–15)
BUN: 26 mg/dL — ABNORMAL HIGH (ref 8–23)
CO2: 25 mmol/L (ref 22–32)
Calcium: 9.5 mg/dL (ref 8.9–10.3)
Chloride: 110 mmol/L (ref 98–111)
Creatinine: 1.12 mg/dL — ABNORMAL HIGH (ref 0.44–1.00)
GFR, Estimated: 52 mL/min — ABNORMAL LOW (ref >60)
Glucose, Bld: 180 mg/dL — ABNORMAL HIGH (ref 70–99)
Potassium: 4 mmol/L (ref 3.5–5.1)
Sodium: 146 mmol/L — ABNORMAL HIGH (ref 135–145)
Total Bilirubin: 0.3 mg/dL (ref 0.3–1.2)
Total Protein: 7 g/dL (ref 6.5–8.1)

## 2020-03-12 LAB — CBC WITH DIFFERENTIAL (CANCER CENTER ONLY)
Abs Immature Granulocytes: 0.06 10*3/uL (ref 0.00–0.07)
Basophils Absolute: 0 10*3/uL (ref 0.0–0.1)
Basophils Relative: 0 %
Eosinophils Absolute: 0 10*3/uL (ref 0.0–0.5)
Eosinophils Relative: 0 %
HCT: 31.4 % — ABNORMAL LOW (ref 36.0–46.0)
Hemoglobin: 9.3 g/dL — ABNORMAL LOW (ref 12.0–15.0)
Immature Granulocytes: 1 %
Lymphocytes Relative: 4 %
Lymphs Abs: 0.5 10*3/uL — ABNORMAL LOW (ref 0.7–4.0)
MCH: 28.9 pg (ref 26.0–34.0)
MCHC: 29.6 g/dL — ABNORMAL LOW (ref 30.0–36.0)
MCV: 97.5 fL (ref 80.0–100.0)
Monocytes Absolute: 0.3 10*3/uL (ref 0.1–1.0)
Monocytes Relative: 3 %
Neutro Abs: 12 10*3/uL — ABNORMAL HIGH (ref 1.7–7.7)
Neutrophils Relative %: 92 %
Platelet Count: 193 10*3/uL (ref 150–400)
RBC: 3.22 MIL/uL — ABNORMAL LOW (ref 3.87–5.11)
RDW: 13.8 % (ref 11.5–15.5)
WBC Count: 12.9 10*3/uL — ABNORMAL HIGH (ref 4.0–10.5)
nRBC: 0 % (ref 0.0–0.2)

## 2020-03-13 DIAGNOSIS — I2510 Atherosclerotic heart disease of native coronary artery without angina pectoris: Secondary | ICD-10-CM | POA: Diagnosis not present

## 2020-03-13 DIAGNOSIS — I10 Essential (primary) hypertension: Secondary | ICD-10-CM | POA: Diagnosis not present

## 2020-03-13 DIAGNOSIS — E785 Hyperlipidemia, unspecified: Secondary | ICD-10-CM | POA: Diagnosis not present

## 2020-03-13 DIAGNOSIS — J309 Allergic rhinitis, unspecified: Secondary | ICD-10-CM | POA: Diagnosis not present

## 2020-03-13 DIAGNOSIS — J449 Chronic obstructive pulmonary disease, unspecified: Secondary | ICD-10-CM | POA: Diagnosis not present

## 2020-03-17 ENCOUNTER — Other Ambulatory Visit: Payer: Self-pay

## 2020-03-17 ENCOUNTER — Inpatient Hospital Stay (HOSPITAL_BASED_OUTPATIENT_CLINIC_OR_DEPARTMENT_OTHER): Payer: Medicare PPO | Admitting: Internal Medicine

## 2020-03-17 VITALS — BP 152/58 | HR 76 | Temp 97.8°F | Resp 14 | Ht 63.0 in

## 2020-03-17 DIAGNOSIS — Z85118 Personal history of other malignant neoplasm of bronchus and lung: Secondary | ICD-10-CM

## 2020-03-17 DIAGNOSIS — R0609 Other forms of dyspnea: Secondary | ICD-10-CM | POA: Diagnosis not present

## 2020-03-17 DIAGNOSIS — Z79899 Other long term (current) drug therapy: Secondary | ICD-10-CM | POA: Diagnosis not present

## 2020-03-17 DIAGNOSIS — C3490 Malignant neoplasm of unspecified part of unspecified bronchus or lung: Secondary | ICD-10-CM | POA: Diagnosis not present

## 2020-03-17 DIAGNOSIS — R5383 Other fatigue: Secondary | ICD-10-CM | POA: Diagnosis not present

## 2020-03-17 DIAGNOSIS — Z9049 Acquired absence of other specified parts of digestive tract: Secondary | ICD-10-CM | POA: Diagnosis not present

## 2020-03-17 DIAGNOSIS — Z923 Personal history of irradiation: Secondary | ICD-10-CM | POA: Diagnosis not present

## 2020-03-17 DIAGNOSIS — C3492 Malignant neoplasm of unspecified part of left bronchus or lung: Secondary | ICD-10-CM | POA: Diagnosis not present

## 2020-03-17 DIAGNOSIS — C3412 Malignant neoplasm of upper lobe, left bronchus or lung: Secondary | ICD-10-CM | POA: Diagnosis not present

## 2020-03-17 DIAGNOSIS — J449 Chronic obstructive pulmonary disease, unspecified: Secondary | ICD-10-CM | POA: Diagnosis not present

## 2020-03-17 DIAGNOSIS — R0602 Shortness of breath: Secondary | ICD-10-CM | POA: Diagnosis not present

## 2020-03-17 NOTE — Progress Notes (Signed)
[place] [phone]   [phone]  OFFICE PROGRESS NOTE  [name], MD [phone] [address] [address]  DIAGNOSIS: Stage IA (T1a, N0, M0) non-small cell lung cancer, adenocarcinoma presented with left upper lobe lung nodule diagnosed in March 2018.  PRIOR THERAPY: S/P RT to the left lung nodule under the care of Dr. Sondra Come completed on 05/04/2016.  CURRENT THERAPY: Observation.  INTERVAL HISTORY: Suzanne Dickson 75 y.o. female returns to the clinic today for follow-up visit accompanied by her husband.  The patient is feeling fine today with no concerning complaints.  She denied having any current chest pain but she has the baseline shortness of breath and she is currently on home oxygen.  She denied having any nausea, vomiting, diarrhea or constipation.  She has no headache or visual changes.  She had repeat CT scan of the chest performed recently and she is here for evaluation and discussion of her scan results.   MEDICAL HISTORY: Past Medical History:  Diagnosis Date  . Adenocarcinoma of left lung, stage 1 ([place]) 04/15/2016   "I'm in remission" (04/05/2017)  . Anxiety   . Arthritis    "legs, elbows" (04/05/2017)  . Bronchitis, chronic ([place])    "haven't had it in awhile" (04/05/2017)  . CHF (congestive heart failure) ([place])   . COPD (chronic obstructive pulmonary disease) ([place])   . Daily headache    "recently" (04/05/2017)  . Depression   . Dyspnea    with exertion  . GERD (gastroesophageal reflux disease)   . History of blood transfusion 1950s  . History of radiation therapy 05/04/16 - 05/12/16   Left lung treated to 54 Gy with 3 fx of 18 Gy  . Hypertension   . On home oxygen therapy    "2L; 24/7" (04/05/2017)  . OSA on CPAP   . Pneumonia 10/2015   "at least 3 times" (04/05/2017)    ALLERGIES:  has No Known Allergies.  MEDICATIONS:  Current Outpatient Medications  Medication Sig Dispense Refill  .  albuterol (PROVENTIL) (2.5 MG/3ML) 0.083% nebulizer solution Take 3 mLs (2.5 mg total) by nebulization every 4 (four) hours as needed. For shortness of breath (Patient taking differently: Take 2.5 mg by nebulization every 4 (four) hours as needed for wheezing or shortness of breath. ) 75 mL 12  . albuterol (VENTOLIN HFA) 108 (90 Base) MCG/ACT inhaler Inhale 2 puffs into the lungs every 4 (four) hours as needed for wheezing or shortness of breath. 8 g 5  . amLODipine (NORVASC) 5 MG tablet Take 1 tablet (5 mg total) by mouth daily. (Patient taking differently: Take 10 mg by mouth every morning. ) 30 tablet 3  . aspirin EC 81 MG tablet Take 81 mg by mouth every morning.    . Azelastine-Fluticasone (DYMISTA) 137-50 MCG/ACT SUSP 2 pffs each nostril at bedtime 23 g 11  . cetirizine (ZYRTEC) 10 MG tablet Take 10 mg by mouth at bedtime.    . cholecalciferol (VITAMIN D3) 25 MCG (1000 UNIT) tablet Take 1,000 Units by mouth daily.    . clopidogrel (PLAVIX) 75 MG tablet Take 75 mg by mouth every morning.     . Cyanocobalamin (B-12 PO) Take 1 tablet by mouth daily.    . famotidine (PEPCID) 20 MG tablet One after supper 30 tablet 11  . ferrous sulfate 325 (65 FE) MG EC tablet Take 325 mg by mouth 3 (three) times daily with meals.     Marland Kitchen  guaiFENesin (MUCINEX) 600 MG 12 hr tablet Take 600 mg by mouth every 12 (twelve) hours.     Marland Kitchen ibuprofen (ADVIL) 200 MG tablet Take 400 mg by mouth every 6 (six) hours as needed (pain).    Marland Kitchen losartan (COZAAR) 50 MG tablet Take 50 mg by mouth every morning.    . metoprolol succinate (TOPROL-XL) 25 MG 24 hr tablet Take 25 mg by mouth every morning.     . nitroGLYCERIN (NITRODUR - DOSED IN MG/24 HR) 0.4 mg/hr patch Place 0.4 mg onto the skin every morning.   3  . nitroGLYCERIN (NITROSTAT) 0.4 MG SL tablet Place 0.4 mg under the tongue every 5 (five) minutes as needed for chest pain.    Marland Kitchen OVER THE COUNTER MEDICATION Take 1 tablet by mouth at bedtime. OTC sleep aid - unknown ingredients     . OXYGEN Inhale 2-4 L/min into the lungs See admin instructions. 2-3 lpm at rest and 4 lpm with exertion    . pantoprazole (PROTONIX) 40 MG tablet Take 30-60 min before first meal of the day (Patient taking differently: Take 40 mg by mouth daily before breakfast. ) 90 tablet 3  . Polyvinyl Alcohol-Povidone (REFRESH OP) Place 1 drop into both eyes daily as needed (dry eyes).    . predniSONE (DELTASONE) 10 MG tablet TAKE 2 TABLETS BY MOUTH DAILY UNTIL FEELING BETTER. THEN TAKE 1 TABLET BY MOUTH ONCE DAILY 100 tablet 0  . sertraline (ZOLOFT) 100 MG tablet Take 100 mg by mouth 2 (two) times daily.  0  . TRELEGY ELLIPTA 100-62.5-25 MCG/INH AEPB INHALE 1 PUFF ONCE DAILY 60 each 5   No current facility-administered medications for this visit.    SURGICAL HISTORY:  Past Surgical History:  Procedure Laterality Date  . COLONOSCOPY    . CORONARY ANGIOPLASTY WITH STENT PLACEMENT  04/05/2017  . CORONARY STENT INTERVENTION N/A 04/05/2017   Procedure: CORONARY STENT INTERVENTION;  Surgeon: [name], MD;  Location: [facility];  Service: Cardiovascular;  Laterality: N/A;  . DILATION AND CURETTAGE OF UTERUS    . FUDUCIAL PLACEMENT Left 03/31/2016   Procedure: PLACEMENT OF FUDUCIAL LEFT UPPER LOBE;  Surgeon: Collene Gobble, MD;  Location: [facility];  Service: Thoracic;  Laterality: Left;  . LAPAROSCOPIC CHOLECYSTECTOMY    . LEFT HEART CATH AND CORONARY ANGIOGRAPHY N/A 02/17/2017   Procedure: LEFT HEART CATH AND CORONARY ANGIOGRAPHY;  Surgeon: [name], MD;  Location: [facility];  Service: Cardiovascular;  Laterality: N/A;  . SINUSOTOMY    . TONSILLECTOMY    . TUBAL LIGATION    . VIDEO BRONCHOSCOPY WITH ENDOBRONCHIAL NAVIGATION N/A 03/31/2016   Procedure: VIDEO BRONCHOSCOPY WITH ENDOBRONCHIAL NAVIGATION;  Surgeon: Collene Gobble, MD;  Location: [facility];  Service: Thoracic;  Laterality: N/A;    REVIEW OF SYSTEMS:  A comprehensive review of systems was negative except for: Constitutional:  positive for fatigue Respiratory: positive for dyspnea on exertion   PHYSICAL EXAMINATION: General appearance: alert, cooperative, fatigued and no distress Head: Normocephalic, without obvious abnormality, atraumatic Neck: no adenopathy, no JVD, supple, symmetrical, trachea midline and thyroid not enlarged, symmetric, no tenderness/mass/nodules Lymph nodes: Cervical, supraclavicular, and axillary nodes normal. Resp: clear to auscultation bilaterally Back: symmetric, no curvature. ROM normal. No CVA tenderness. Cardio: regular rate and rhythm, S1, S2 normal, no murmur, click, rub or gallop GI: soft, non-tender; bowel sounds normal; no masses,  no organomegaly Extremities: extremities normal, atraumatic, no cyanosis or edema  ECOG PERFORMANCE STATUS: 1 - Symptomatic but  completely ambulatory  Blood pressure (!) 152/58, pulse 76, temperature 97.8 F (36.6 C), resp. rate 14, height 5\' 3"  (1.6 m), SpO2 99 %.  LABORATORY DATA: Lab Results  Component Value Date   WBC 12.9 (H) 03/12/2020   HGB 9.3 (L) 03/12/2020   HCT 31.4 (L) 03/12/2020   MCV 97.5 03/12/2020   PLT 193 03/12/2020      Chemistry      Component Value Date/Time   NA 146 (H) 03/12/2020 1453   NA 141 08/13/2016 1120   K 4.0 03/12/2020 1453   K 3.4 (L) 08/13/2016 1120   CL 110 03/12/2020 1453   CO2 25 03/12/2020 1453   CO2 25 08/13/2016 1120   BUN 26 (H) 03/12/2020 1453   BUN 15.8 08/13/2016 1120   CREATININE 1.12 (H) 03/12/2020 1453   CREATININE 1.1 08/13/2016 1120      Component Value Date/Time   CALCIUM 9.5 03/12/2020 1453   CALCIUM 9.1 08/13/2016 1120   ALKPHOS 56 03/12/2020 1453   ALKPHOS 68 08/13/2016 1120   AST 19 03/12/2020 1453   AST 20 08/13/2016 1120   ALT 26 03/12/2020 1453   ALT 14 08/13/2016 1120   BILITOT 0.3 03/12/2020 1453   BILITOT 0.47 08/13/2016 1120       RADIOGRAPHIC STUDIES: CT Chest Wo Contrast  Result Date: 03/13/2020 CLINICAL DATA:  Non-small cell lung cancer EXAM: CT CHEST  WITHOUT CONTRAST TECHNIQUE: Multidetector CT imaging of the chest was performed following the standard protocol without IV contrast. COMPARISON:  Chest CT 02/26/2019 FINDINGS: Cardiovascular: Post CABG Mediastinum/Nodes: No axillary or supraclavicular adenopathy. No mediastinal or hilar adenopathy. No pericardial fluid. Esophagus normal. Lungs/Pleura: Linear band of perihilar fibrotic change in the LEFT lung consistent radiation treatment. Change in pattern or size from comparison exam. Fiducial marker is noted in the vicinity of the radiation change. Extensive centrilobular emphysema the upper lobes. Upper Abdomen: Limited view of the liver, kidneys, pancreas are unremarkable. Normal adrenal glands. Musculoskeletal: No aggressive osseous lesion. IMPRESSION: 1. No change in LEFT perihilar linear fibrosis related to prior radiation treatment. 2. No new nodularity within lungs. 3. No lymphadenopathy. Electronically Signed   By: Suzy Bouchard M.D.   On: 03/13/2020 10:47    ASSESSMENT AND PLAN: This is a very pleasant 75 years old white female with a stage IA non-small cell lung cancer, adenocarcinoma presented with left upper lobe lung nodule. The patient is status post stereotactic radiotherapy to the left upper lobe lung nodule and she tolerated the procedure well. The patient is currently on observation and she is feeling fine today with no concerning complaints. She had repeat CT scan of the chest performed recently.  I personally and independently reviewed the scan and discussed the results with the patient and her husband today. Her scan showed no concerning findings for disease recurrence or metastasis. I recommended for the patient to continue on observation with repeat CT scan of the chest in 1 year. She was advised to call immediately if she has any concerning symptoms in the interval. The patient voices understanding of current disease status and treatment options and is in agreement with the  current care plan. All questions were answered. The patient knows to call the clinic with any problems, questions or concerns. We can certainly see the patient much sooner if necessary.  Disclaimer: This note was dictated with voice recognition software. Similar sounding words can inadvertently be transcribed and may not be corrected upon review.

## 2020-03-19 ENCOUNTER — Telehealth: Payer: Self-pay | Admitting: Internal Medicine

## 2020-03-19 NOTE — Telephone Encounter (Signed)
Scheduled appts per 2/21 los. Was not able to leave voicemail. Will mail appt reminder and calendar per 2/24 staff msg sent to self.

## 2020-03-21 DIAGNOSIS — I10 Essential (primary) hypertension: Secondary | ICD-10-CM | POA: Diagnosis not present

## 2020-03-21 DIAGNOSIS — J42 Unspecified chronic bronchitis: Secondary | ICD-10-CM | POA: Diagnosis not present

## 2020-04-18 DIAGNOSIS — J42 Unspecified chronic bronchitis: Secondary | ICD-10-CM | POA: Diagnosis not present

## 2020-04-18 DIAGNOSIS — I10 Essential (primary) hypertension: Secondary | ICD-10-CM | POA: Diagnosis not present

## 2020-04-22 ENCOUNTER — Other Ambulatory Visit: Payer: Self-pay | Admitting: Internal Medicine

## 2020-04-30 ENCOUNTER — Telehealth: Payer: Self-pay | Admitting: Internal Medicine

## 2020-04-30 NOTE — Telephone Encounter (Signed)
PA request was received from (pharmacy): [facility] [phone] Fax:  Medication name and strength: Azelastine-Fluticasone 137-50 mcg Ordering Provider: Dr Melvyn Novas  Was PA started with [facility]?: Yes If yes, please enter KEY: [id number] Medication tried and failed: Nasonex Covered Alternatives: N/A  PA sent to plan, time frame for approval / denial: within 3-7 days Routing to [place] for follow-up

## 2020-05-06 NOTE — Telephone Encounter (Signed)
Checked status of PA today and still waiting on response.   Will route back to [facility] to follow up on .

## 2020-05-14 NOTE — Telephone Encounter (Signed)
Received fax from [facility] that [place] has been approved. Will call pharmacy to notify.   Nothing further needed at this time.

## 2020-05-19 DIAGNOSIS — J42 Unspecified chronic bronchitis: Secondary | ICD-10-CM | POA: Diagnosis not present

## 2020-05-19 DIAGNOSIS — I10 Essential (primary) hypertension: Secondary | ICD-10-CM | POA: Diagnosis not present

## 2020-06-02 ENCOUNTER — Encounter: Payer: Self-pay | Admitting: Internal Medicine

## 2020-06-02 ENCOUNTER — Ambulatory Visit (INDEPENDENT_AMBULATORY_CARE_PROVIDER_SITE_OTHER): Payer: Medicare PPO | Admitting: Internal Medicine

## 2020-06-02 ENCOUNTER — Other Ambulatory Visit: Payer: Self-pay

## 2020-06-02 DIAGNOSIS — J449 Chronic obstructive pulmonary disease, unspecified: Secondary | ICD-10-CM | POA: Diagnosis not present

## 2020-06-02 DIAGNOSIS — J9611 Chronic respiratory failure with hypoxia: Secondary | ICD-10-CM | POA: Diagnosis not present

## 2020-06-02 NOTE — Progress Notes (Signed)
Subjective:     Patient ID: Suzanne Dickson, female   DOB: 11/26/1945     MRN: [id number]    Brief patient profile:  11 yobf quit smoking on admit 09/02/14  and proved to have GOLD III copd 11/2014     History of Present Illness  09/22/2015  f/u ov/Suzanne Dickson re: copd III/ symb 1602bid on 2lpm at rest/ 4lpm ex  Chief Complaint  Patient presents with  . Follow-up    [place] had hemoptysis and increased SOB. She took round pred and augmentin and symptoms are some better. She has noticed minimal wheezing at night. She has used neb 3 x over the past 2 wks.   liked bevespi better but wasn't covered then and not doing as well on the symbicort though hfa not ideal  - see a/p rec Plan A = Automatic =stop symbicort and restart Bevespi Take 2 puffs first thing in am and then another 2 puffs about 12 hours later.  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 Acute Visit    Breathing has been worse x 2 wks. She has been producing some yellow sputum over the past 2 days. She states also wheezing. She has been using her albuterol inhaler 3 x per day and she has not used neb.   Dyspnea:  MMRC3 = baseline can't walk 100 yards even at a slow pace at a flat grade s stopping due to sob on 2lpm but sometimes on RA now worse 2 weeks assoc with watery rhinitis no better with zyrtec  Cough:  more productive x 2 weeks assoc with pnds but min yellow  Sleeping: 2lpm  Bed flat and 2 big pillows  SABA use: not usually needing but now tid  02: 2lpm baseline now up to 3lpm  rec Prednisone 10 mg take  4 each am x 2 days,   2 each am x 2 days,  1 each am x 2 days and stop  Plan A = Automatic = symbicort 160 Take 2 puffs first thing in am and then another 2 puffs about 12 hours later.  Plan B = Backup Only use your albuterol as a rescue medication Plan C = Crisis - only use your albuterol nebulizer if you first try Plan B and it fails to help > ok to use the nebulizer up to every 4 hours but if start needing it regularly call for immediate appointment Goal for 02 is to keep saturation over 90%  Please schedule a follow up office visit in 4 weeks, sooner if needed  with all medications /inhalers/ solutions in hand so we can verify exactly what you are taking. This includes all medications from all doctors and over the counters    11/16/2017  f/u  ov/Suzanne Dickson re: copd III criteria but 02 dep/ did not bring all meds as req  Chief Complaint  Patient presents with  . Follow-up    Breathing better but back quite back to baseline. She still has rhinitis and PND. She is using her albuterol inhaler 8-10 x per wk.  She rarely uses her neb.   Dyspnea: .MMRC3 = can't walk 100 yards even at a slow pace at a flat grade s stopping due to sob  On RA or  3lpm  - not monitoring sats as rec  Cough: none  Sleeping: ok 1pillow / bed flat SABA use: as above  02: 2lpm hs / prn daytime  rec Prednisone 10 mg take  4 each am x 2 days,   2 each am x 2 days,  1 each am x 2 days and stop  - add  No 02 needed at rest, max 02 when walking more than room to room at home to prevent desats ENT eval  > Suzanne Dickson saw 12/07/17 dx ? 02 irritation     12/27/17 phone care for flair of nasal congestion purulent bronchitis: Augmentin 875 mg take one pill twice daily  X 10 days  Prednisone 10 mg take  4 each am x 2 days,   2 each  am x 2 days,  1 each am x 2 days and stop     01/11/2018  f/u ov/Suzanne Dickson re: gold III copd / still struggling with how/ when to use 02 which is irritating her nose Chief Complaint  Patient presents with  . Follow-up    COPD GOLD III/O2 dep  Dyspnea:  MMRC4  = sob if tries to leave home or while getting dressed   Cough: x 2 days hs coughing fits  Sleeping: bed flat, 1-2 pillow  SABA use: on prednisone no need 02: 2lpm hs not humidified   rec Need to humidify your home 02 per [employer] Goal for your 02 is to keep saturations over 90 so don't use it at rest unless you need  Elevate the head of your bed on 6-8 inch blocks For cough > mucinex dm up to 1200 mg every 12 hours as needed If get worse >  Prednisone 10 mg take  4 each am x 2 days,   2 each am x 2 days,  1 each am x 2 days and stop      02/17/2018  f/u ov/Suzanne Dickson re: gold III criteria but 02 dep, doing better with nasal symptoms on humidified 02  Chief Complaint  Patient presents with  . Follow-up    Breathing is some better. She has some nasal congestion in the am- clear, bloody nasal d/c. She is using her proair 2-3 x per wk on average.   Dyspnea:  Better vs priors/ pred helped nasal symptoms a lot and also doe  Cough: no Sleeping: 2 pillows/ bed blocks on order  SABA use: a little more since stopped prednisone  02: 2lpm 24/7 but up to 4 active rec Plan A = Automatic = Trelegy one click first thing each am Plan B = Backup Only use your albuterol as a rescue medication Plan C = Crisis - only use your albuterol nebulizer if you first try Plan B and it fails to help > ok to use the nebulizer up to every 4 hours but if start needing it regularly call for immediate appointmentPlan D = Deltasone= Prednisone - if needing the nebulizer more than twice daily then start prednisone :  Prednisone 10 mg take  4 each am x 2 days,   2 each am x 2 days,  1 each am x 2 days and stop   Use humdified 02 and adjust to sats overy 90% during the day  2 lpm sitting  Sleeping and 4lpm    televist 10/06/2018 rec Remember prednisone is refillable and can be used when you are losing ground with your breathing and using more of your nebulizer than usual as Plan D below   Plan A = Automatic = Trelegy one click first thing each am Plan B = Backup Only use your albuterol as a rescue medication  Plan C = Crisis - only use your albuterol nebulizer if you first try Plan B and it fails to help > ok to use the nebulizer up to every 4 hours but if start needing it regularly call for immediate appointment Plan D = Deltasone= Prednisone - if needing the nebulizer more than twice daily then start prednisone : Prednisone 10 mg take  4 each am x 2 days,   2 each am x 2 days,  1 each am x 2 days and stop     02/12/19 Televist Dymista two puffs in each nostril at bedtime and if not better see Dr Janace Hoard  Continue zyrtec and pepcid at bedtime    03/29/2019  f/u ov/Nesanel Aguila re:  GOLD II/ 02 dep  On dymista 2 pffs hs / off pred now  Last took it one week prior to [place]  Patient presents with  . Follow-up    Breathing is overall doing well. She has occ wheezing at night. She is using her albuterol inhaler about 8 x per wk and rarely uses neb.   Dyspnea:  Room to room but 'fine if stays sitting"  = "helps me breathe better" (ie very poor insight into the goals of resp rx to improve activity tol) Cough: assoc with some wheezing /watery rhinits no better even  on prednisone/dymista Sleeping:on a wedge x 45 degrees  SABA use: rarely at hs ? Helps some  02: 4lpm with activity  And 2-3 lpm at hs  rec Make sure you check your oxygen saturations at highest level of activity  Try albuterol 15 min before an activity that you know would make you short of breath and see if it makes any difference and if makes none then don't take it after activity unless you can't catch your breath. See your ENT doctor regarding your nasal congestion if dymista not  helping    NP rec  07/23/19  COPD: Continue Trelegy 1 puff daily in the morning (rinse mouth after use) Continue prednisone 10mg  daily  Chronic respiratory failure: Continue 4L oxygen at rest; use 4-6L on exertion to keep O2 >88-90%  Orders: Needs qualifying walk for POC with DME/ [facility] Needs larger Oxygen tanks 4-6L continuous    12/03/2019  f/u ov/Gillie Fleites re:  GOLD II COPD / 02 dep  On Trelegy and pred 10mg  daily since august 2021 s flare  Chief Complaint  Patient presents with  . Follow-up    Breathing has improved some since the last visit. She is using her albuterol inhaler 4-6 x per wk and has not used neb recently.   Dyspnea:  Room to room, nothing outdoors  Cough: none Sleeping: wedge at 45 degrees insomnia but no resp issues  SABA use: as above 02: 4lpm 24 x 6 with walking but not checking sats  rec Make  sure you check your oxygen saturations at highest level of activity   Prednisone ceiling is 20 mg per day until better, then 10 mg daily x one week, then 5 mg (one half of a 10 mg tablet)  - if get worse start over   06/02/2020  f/u ov/Carrol Bondar re: COPD II copd/ 02 dep on trelegy and prednisone 10 mg one-half daily  Chief Complaint  Patient presents with  . Follow-up    Having increased SOB recently- relates to pollen allergy. Has also noticed some wheezing. She is using her albuterol inhaler 2-3 x per day. She has not used her neb lately.    Dyspnea: baseline room to room with rollator/ worse with pollen, better on higher dose of pred and worked back to 5 mg s flare yet Cough: none  Sleeping: wedge = 45 degrees  SABA use: very lttile  02: 4lpm at rest, 6-7 lpm when walking Covid status:  vax x 3    No obvious day to day or daytime variability or assoc excess/ purulent sputum or mucus plugs or hemoptysis or cp or chest tightness,  or overt sinus or hb symptoms.   Sleeping as above  without nocturnal  or early am exacerbation  of respiratory  c/o's or need for noct saba.  Also denies any obvious fluctuation of symptoms with weather or environmental changes or other aggravating or alleviating factors except as outlined above   No unusual exposure hx or h/o childhood pna/ asthma or knowledge of premature birth.  Current Allergies, Complete Past Medical History, Past Surgical History, Family History, and Social History were reviewed in [employer] record.  ROS  The following are not active complaints unless bolded Hoarseness, sore throat, dysphagia, dental problems, itching, sneezing,  nasal congestion or discharge of excess mucus or purulent secretions, ear ache,   fever, chills, sweats, unintended wt loss or wt gain, classically pleuritic or exertional cp,  orthopnea pnd or arm/hand swelling  or leg swelling, presyncope, palpitations, abdominal pain, anorexia, nausea, vomiting, diarrhea  or change in bowel habits or change in bladder habits, change in stools or change in urine, dysuria, hematuria,  rash, arthralgias, visual complaints, headache, numbness, weakness or ataxia or problems with walking or coordination,  change in mood or  memory.        Current Meds  Medication Sig  . albuterol (PROVENTIL) (2.5 MG/3ML) 0.083% nebulizer solution Take 3 mLs (2.5 mg total) by nebulization every 4 (four) hours as needed. For shortness of breath (Patient taking differently: Take 2.5 mg by nebulization every 4 (four) hours as needed for wheezing or shortness of breath.)  . albuterol (VENTOLIN HFA) 108 (90 Base) MCG/ACT inhaler Inhale 2 puffs into the lungs every 4 (four) hours as needed for wheezing or shortness of breath.  Marland Kitchen amLODipine (NORVASC) 5 MG tablet Take 1 tablet (5 mg total) by mouth daily. (Patient taking differently: Take 10 mg by mouth every morning.)  . aspirin EC 81 MG tablet Take 81 mg by mouth every morning.  . Azelastine-Fluticasone 137-50 MCG/ACT SUSP 2 PUFFS INTO EACH NOSTRIL AT BEDTIME  . cetirizine (ZYRTEC) 10 MG tablet Take 10 mg by  mouth at bedtime.  . cholecalciferol (VITAMIN D3) 25 MCG (1000 UNIT) tablet Take 1,000 Units by mouth daily.  . clopidogrel (PLAVIX) 75 MG tablet Take 75 mg by mouth every morning.   . Cyanocobalamin (B-12 PO) Take 1 tablet by mouth daily.  . famotidine (PEPCID) 20 MG tablet One after supper  .  ferrous sulfate 325 (65 FE) MG EC tablet Take 325 mg by mouth 3 (three) times daily with meals.   Marland Kitchen ibuprofen (ADVIL) 200 MG tablet Take 400 mg by mouth every 6 (six) hours as needed (pain).  Marland Kitchen losartan (COZAAR) 50 MG tablet Take 50 mg by mouth every morning.  . metoprolol succinate (TOPROL-XL) 25 MG 24 hr tablet Take 25 mg by mouth every morning.   . nitroGLYCERIN (NITRODUR - DOSED IN MG/24 HR) 0.4 mg/hr patch Place 0.4 mg onto the skin every morning.   . nitroGLYCERIN (NITROSTAT) 0.4 MG SL tablet Place 0.4 mg under the tongue every 5 (five) minutes as needed for chest pain.  Marland Kitchen OVER THE COUNTER MEDICATION Take 1 tablet by mouth at bedtime. OTC sleep aid - unknown ingredients  . OXYGEN Inhale 2-4 L/min into the lungs See admin instructions. 2-3 lpm at rest and 4 lpm with exertion  . pantoprazole (PROTONIX) 40 MG tablet Take 30-60 min before first meal of the day (Patient taking differently: Take 40 mg by mouth daily before breakfast.)  . Polyvinyl Alcohol-Povidone (REFRESH OP) Place 1 drop into both eyes daily as needed (dry eyes).  . predniSONE (DELTASONE) 10 MG tablet TAKE 2 TABLETS BY MOUTH DAILY UNTIL FEELING BETTER. THEN TAKE 1 TABLET BY MOUTH ONCE DAILY  . sertraline (ZOLOFT) 100 MG tablet Take 100 mg by mouth 2 (two) times daily.  Viviana Simpler ELLIPTA 100-62.5-25 MCG/INH AEPB INHALE 1 PUFF ONCE DAILY                          Objective:  Physical Exam    06/02/2020     170  12/03/2019   162 03/29/2019     164  08/08/2018   154 10/28/2014        160 > 12/09/2014 161 > 03/14/2015 158 >    04/25/2015  161 >  06/20/2015  158 > 09/22/2015   159 > 11/19/2015 159 > 12/23/2015   159  > 02/03/2016    163  >  05/18/2016   161 > 11/17/2016   154 > 01/07/2017  157 > 04/18/2017   148 > 07/19/2017  145 > 10/19/2017    150  > 11/16/2017   150 >  01/11/2018   150> 02/17/2018  150     10/10/14 160 lb (72.576 kg)  09/16/14 156 lb (70.761 kg)  09/04/14 160 lb 13.6 oz (72.96 kg)      Vital signs reviewed  06/02/2020  - Note at rest 02 sats  100% on 4lpm cont   General appearance:    amb wf using rollator   HEENT : pt wearing mask not removed for exam due to covid -19 concerns.    NECK :  without JVD/Nodes/TM/ nl carotid upstrokes bilaterally   LUNGS: no acc muscle use,  Mod barrel  contour chest wall with bilateral  Distant bs s audible wheeze and  without cough on insp or exp maneuvers and mod  Hyperresonant  to  percussion bilaterally     CV:  RRR  no s3 or murmur or increase in P2, and no edema   ABD:  soft and nontender with pos mid insp Hoover's  in the supine position. No bruits or organomegaly appreciated, bowel sounds nl  MS:     ext warm without deformities, calf tenderness, cyanosis or clubbing No obvious joint restrictions   SKIN: warm and dry without lesions    NEURO:  alert, approp,  nl sensorium with  no motor or cerebellar deficits apparent.         I personally reviewed images and agree with radiology impression as follows:  CXR:   w contrast 02/26/19 1. Unchanged bandlike post treatment fibrotic change and volume loss of the perihilar left lung with adjacent fiducial markers. Unchanged bandlike scarring of the medial right upper lobe and medial segment right middle lobe. No evidence of recurrent malignancy or lymphadenopathy. 2.  Emphysema (ICD10-J43.9). 3.  Coronary artery disease.  Aortic Atherosclerosis (ICD10-I70.0). 4.  Nonobstructive left nephrolithiasis.       Assessment:

## 2020-06-02 NOTE — Assessment & Plan Note (Signed)
Quit smoking 08/2014 - PFT's 12/09/14   FEV1 0.75 (44 % ) ratio 62  p no % improvement from saba p ? prior to study with DLCO  24 % corrects to 48 % for alv volume    - 12/09/2014 referred to rehab > started 12/2014  - 04/25/2015   try BEVESPI  - 06/20/2015  Changed by insurance> back on symb 160  - 09/22/2015 not happy with symbicort > changed back to besvespi 2bid > not happy with bevespi > changed back to symb 160 2bid and consider adding spiriva next vs trelegy trial   - 02/17/2018  After extensive coaching inhaler device,  effectiveness =    50% with hfa and 90% with elipta so rec changed to trelegy   - 05/08/2018 added pred x 6 days as "plan D"  - 01/15/2019 changed pred to 20 mg until better then 10 mg daily  - 03/29/2019  After extensive coaching inhaler device,  effectiveness =    50% with hfa  - 12/03/2019 changed to ceiling of 20 mg pred/ floor of 5 mg per day   Group D in terms of symptom/risk and laba/lama/ICS  therefore appropriate rx at this point >>>  Continue trelegy daily plus pred max 20 and min 5 mg daily plus approp saba:  I spent extra time with pt today reviewing appropriate use of albuterol for prn use on exertion with the following points: 1) saba is for relief of sob that does not improve by walking a slower pace or resting but rather if the pt does not improve after trying this first. 2) If the pt is convinced, as many are, that saba helps recover from activity faster then it's easy to tell if this is the case by re-challenging : ie stop, take the inhaler, then p 5 minutes try the exact same activity (intensity of workload) that just caused the symptoms and see if they are substantially diminished or not after saba 3) if there is an activity that reproducibly causes the symptoms, try the saba 15 min before the activity on alternate days   If in fact the saba really does help, then fine to continue to use it prn but advised may need to look closer at the maintenance regimen being  used to achieve better control of airways disease with exertion.

## 2020-06-02 NOTE — Assessment & Plan Note (Signed)
Started on 02 2lpm at d/c 09/04/14  - 10/28/2014   Walked RA x one lap @ 185 stopped due to  desat to 82%  at nl pace so rec 02 2lpm with walking and sleeping  - 12/09/2014 referred for POC > did not qualify - 08/17/2016 Patient Saturations on Room Air at Rest = 85%---increased to 93% on 2lpm o2 - 04/18/2017  Walking across exam room sats dropped into 80's on 2lpm  - 11/16/2017   Walked RA x one lap @ 185 stopped due to 87% and even on 5lpm could not maintain sats at nl pace  -  01/11/2018 humidfy 02  Added > not using as of 05/08/2018 > rec she do so > improved on humidity - 03/29/2019  Required up to 6lpm with activity to complete about 200 ft walking    Make sure you check your oxygen saturation at your highest level of activity  to be sure it stays over 90% and adjust  02 flow upward to maintain this level if needed but remember to turn it back to previous settings when you stop (to conserve your supply).          Each maintenance medication was reviewed in detail including emphasizing most importantly the difference between maintenance and prns and under what circumstances the prns are to be triggered using an action plan format where appropriate.  Total time for H and P, chart review, counseling, reviewing dpi/02 device(s) and generating customized AVS unique to this office visit / same day charting = 22 min

## 2020-06-02 NOTE — Patient Instructions (Signed)
No change medications   Make sure you check your oxygen saturation  at your highest level of activity  to be sure it stays over 90% and adjust  02 flow upward to maintain this level if needed but remember to turn it back to previous settings when you stop (to conserve your supply).   Please schedule a follow up visit in 6 months but call sooner if needed

## 2020-06-12 DIAGNOSIS — I2510 Atherosclerotic heart disease of native coronary artery without angina pectoris: Secondary | ICD-10-CM | POA: Diagnosis not present

## 2020-06-12 DIAGNOSIS — E785 Hyperlipidemia, unspecified: Secondary | ICD-10-CM | POA: Diagnosis not present

## 2020-06-12 DIAGNOSIS — J449 Chronic obstructive pulmonary disease, unspecified: Secondary | ICD-10-CM | POA: Diagnosis not present

## 2020-06-12 DIAGNOSIS — J309 Allergic rhinitis, unspecified: Secondary | ICD-10-CM | POA: Diagnosis not present

## 2020-06-12 DIAGNOSIS — I10 Essential (primary) hypertension: Secondary | ICD-10-CM | POA: Diagnosis not present

## 2020-06-18 DIAGNOSIS — J42 Unspecified chronic bronchitis: Secondary | ICD-10-CM | POA: Diagnosis not present

## 2020-06-18 DIAGNOSIS — I10 Essential (primary) hypertension: Secondary | ICD-10-CM | POA: Diagnosis not present

## 2020-07-19 DIAGNOSIS — I10 Essential (primary) hypertension: Secondary | ICD-10-CM | POA: Diagnosis not present

## 2020-07-19 DIAGNOSIS — J42 Unspecified chronic bronchitis: Secondary | ICD-10-CM | POA: Diagnosis not present

## 2020-08-09 ENCOUNTER — Other Ambulatory Visit: Payer: Self-pay | Admitting: Internal Medicine

## 2020-08-18 DIAGNOSIS — J42 Unspecified chronic bronchitis: Secondary | ICD-10-CM | POA: Diagnosis not present

## 2020-08-18 DIAGNOSIS — I10 Essential (primary) hypertension: Secondary | ICD-10-CM | POA: Diagnosis not present

## 2020-09-15 ENCOUNTER — Emergency Department (HOSPITAL_COMMUNITY): Payer: Medicare PPO

## 2020-09-15 ENCOUNTER — Inpatient Hospital Stay (HOSPITAL_COMMUNITY)
Admission: EM | Admit: 2020-09-15 | Discharge: 2020-09-22 | DRG: 177 | Disposition: A | Discharge status: Home Health | Payer: Medicare PPO | Attending: Internal Medicine | Admitting: Internal Medicine

## 2020-09-15 ENCOUNTER — Encounter (HOSPITAL_COMMUNITY): Payer: Self-pay | Admitting: Emergency Medicine

## 2020-09-15 ENCOUNTER — Other Ambulatory Visit: Payer: Self-pay

## 2020-09-15 ENCOUNTER — Telehealth: Payer: Self-pay | Admitting: Internal Medicine

## 2020-09-15 DIAGNOSIS — J9601 Acute respiratory failure with hypoxia: Secondary | ICD-10-CM | POA: Diagnosis not present

## 2020-09-15 DIAGNOSIS — J9600 Acute respiratory failure, unspecified whether with hypoxia or hypercapnia: Secondary | ICD-10-CM | POA: Diagnosis not present

## 2020-09-15 DIAGNOSIS — R9389 Abnormal findings on diagnostic imaging of other specified body structures: Secondary | ICD-10-CM | POA: Diagnosis present

## 2020-09-15 DIAGNOSIS — J9622 Acute and chronic respiratory failure with hypercapnia: Secondary | ICD-10-CM | POA: Diagnosis present

## 2020-09-15 DIAGNOSIS — J9621 Acute and chronic respiratory failure with hypoxia: Secondary | ICD-10-CM | POA: Diagnosis present

## 2020-09-15 DIAGNOSIS — Z6831 Body mass index (BMI) 31.0-31.9, adult: Secondary | ICD-10-CM | POA: Diagnosis not present

## 2020-09-15 DIAGNOSIS — R062 Wheezing: Secondary | ICD-10-CM | POA: Diagnosis not present

## 2020-09-15 DIAGNOSIS — Z85118 Personal history of other malignant neoplasm of bronchus and lung: Secondary | ICD-10-CM | POA: Diagnosis not present

## 2020-09-15 DIAGNOSIS — R0689 Other abnormalities of breathing: Secondary | ICD-10-CM | POA: Diagnosis not present

## 2020-09-15 DIAGNOSIS — I272 Pulmonary hypertension, unspecified: Secondary | ICD-10-CM | POA: Diagnosis present

## 2020-09-15 DIAGNOSIS — N179 Acute kidney failure, unspecified: Secondary | ICD-10-CM | POA: Diagnosis present

## 2020-09-15 DIAGNOSIS — G4733 Obstructive sleep apnea (adult) (pediatric): Secondary | ICD-10-CM | POA: Diagnosis present

## 2020-09-15 DIAGNOSIS — D539 Nutritional anemia, unspecified: Secondary | ICD-10-CM | POA: Diagnosis present

## 2020-09-15 DIAGNOSIS — U071 COVID-19: Principal | ICD-10-CM | POA: Diagnosis present

## 2020-09-15 DIAGNOSIS — E876 Hypokalemia: Secondary | ICD-10-CM | POA: Diagnosis present

## 2020-09-15 DIAGNOSIS — R000 Tachycardia, unspecified: Secondary | ICD-10-CM | POA: Diagnosis not present

## 2020-09-15 DIAGNOSIS — I10 Essential (primary) hypertension: Secondary | ICD-10-CM | POA: Diagnosis present

## 2020-09-15 DIAGNOSIS — E669 Obesity, unspecified: Secondary | ICD-10-CM | POA: Diagnosis present

## 2020-09-15 DIAGNOSIS — R9431 Abnormal electrocardiogram [ECG] [EKG]: Secondary | ICD-10-CM | POA: Diagnosis not present

## 2020-09-15 DIAGNOSIS — R0602 Shortness of breath: Secondary | ICD-10-CM | POA: Diagnosis not present

## 2020-09-15 DIAGNOSIS — I2510 Atherosclerotic heart disease of native coronary artery without angina pectoris: Secondary | ICD-10-CM | POA: Diagnosis present

## 2020-09-15 DIAGNOSIS — J439 Emphysema, unspecified: Secondary | ICD-10-CM | POA: Diagnosis not present

## 2020-09-15 DIAGNOSIS — J1282 Pneumonia due to coronavirus disease 2019: Secondary | ICD-10-CM | POA: Diagnosis present

## 2020-09-15 DIAGNOSIS — J42 Unspecified chronic bronchitis: Secondary | ICD-10-CM | POA: Diagnosis not present

## 2020-09-15 DIAGNOSIS — Z79899 Other long term (current) drug therapy: Secondary | ICD-10-CM

## 2020-09-15 DIAGNOSIS — J441 Chronic obstructive pulmonary disease with (acute) exacerbation: Secondary | ICD-10-CM | POA: Diagnosis present

## 2020-09-15 DIAGNOSIS — Z9981 Dependence on supplemental oxygen: Secondary | ICD-10-CM

## 2020-09-15 DIAGNOSIS — F419 Anxiety disorder, unspecified: Secondary | ICD-10-CM | POA: Diagnosis present

## 2020-09-15 DIAGNOSIS — Z7952 Long term (current) use of systemic steroids: Secondary | ICD-10-CM

## 2020-09-15 DIAGNOSIS — Z923 Personal history of irradiation: Secondary | ICD-10-CM | POA: Diagnosis not present

## 2020-09-15 DIAGNOSIS — Z7982 Long term (current) use of aspirin: Secondary | ICD-10-CM | POA: Diagnosis not present

## 2020-09-15 DIAGNOSIS — Z7951 Long term (current) use of inhaled steroids: Secondary | ICD-10-CM | POA: Diagnosis not present

## 2020-09-15 DIAGNOSIS — R0902 Hypoxemia: Secondary | ICD-10-CM | POA: Diagnosis not present

## 2020-09-15 DIAGNOSIS — R059 Cough, unspecified: Secondary | ICD-10-CM | POA: Diagnosis not present

## 2020-09-15 DIAGNOSIS — Z955 Presence of coronary angioplasty implant and graft: Secondary | ICD-10-CM | POA: Diagnosis not present

## 2020-09-15 HISTORY — DX: Essential (primary) hypertension: I10

## 2020-09-15 HISTORY — DX: Malignant neoplasm of unspecified part of unspecified bronchus or lung: C34.90

## 2020-09-15 HISTORY — DX: Chronic obstructive pulmonary disease, unspecified: J44.9

## 2020-09-15 HISTORY — DX: Pulmonary hypertension, unspecified: I27.20

## 2020-09-15 HISTORY — DX: Atherosclerotic heart disease of native coronary artery without angina pectoris: I25.10

## 2020-09-15 HISTORY — DX: Obstructive sleep apnea (adult) (pediatric): G47.33

## 2020-09-15 LAB — CBC WITH DIFFERENTIAL/PLATELET
Abs Immature Granulocytes: 0.06 10*3/uL (ref 0.00–0.07)
Basophils Absolute: 0 10*3/uL (ref 0.0–0.1)
Basophils Relative: 0 %
Eosinophils Absolute: 0.1 10*3/uL (ref 0.0–0.5)
Eosinophils Relative: 1 %
HCT: 32.3 % — ABNORMAL LOW (ref 36.0–46.0)
Hemoglobin: 9.8 g/dL — ABNORMAL LOW (ref 12.0–15.0)
Immature Granulocytes: 1 %
Lymphocytes Relative: 28 %
Lymphs Abs: 2.8 10*3/uL (ref 0.7–4.0)
MCH: 31.1 pg (ref 26.0–34.0)
MCHC: 30.3 g/dL (ref 30.0–36.0)
MCV: 102.5 fL — ABNORMAL HIGH (ref 80.0–100.0)
Monocytes Absolute: 0.4 10*3/uL (ref 0.1–1.0)
Monocytes Relative: 4 %
Neutro Abs: 6.6 10*3/uL (ref 1.7–7.7)
Neutrophils Relative %: 66 %
Platelets: 158 10*3/uL (ref 150–400)
RBC: 3.15 MIL/uL — ABNORMAL LOW (ref 3.87–5.11)
RDW: 12 % (ref 11.5–15.5)
WBC: 9.9 10*3/uL (ref 4.0–10.5)
nRBC: 0 % (ref 0.0–0.2)

## 2020-09-15 LAB — I-STAT ARTERIAL BLOOD GAS, ED
Acid-Base Excess: 1 mmol/L (ref 0.0–2.0)
Bicarbonate: 27.2 mmol/L (ref 20.0–28.0)
Calcium, Ion: 1.23 mmol/L (ref 1.15–1.40)
HCT: 29 % — ABNORMAL LOW (ref 36.0–46.0)
Hemoglobin: 9.9 g/dL — ABNORMAL LOW (ref 12.0–15.0)
O2 Saturation: 71 %
Patient temperature: 98.6
Potassium: 3.2 mmol/L — ABNORMAL LOW (ref 3.5–5.1)
Sodium: 142 mmol/L (ref 135–145)
TCO2: 29 mmol/L (ref 22–32)
pCO2 arterial: 51.6 mmHg — ABNORMAL HIGH (ref 32.0–48.0)
pH, Arterial: 7.329 — ABNORMAL LOW (ref 7.350–7.450)
pO2, Arterial: 41 mmHg — ABNORMAL LOW (ref 83.0–108.0)

## 2020-09-15 LAB — COMPREHENSIVE METABOLIC PANEL
ALT: 28 U/L (ref 0–44)
AST: 39 U/L (ref 15–41)
Albumin: 3.4 g/dL — ABNORMAL LOW (ref 3.5–5.0)
Alkaline Phosphatase: 41 U/L (ref 38–126)
Anion gap: 9 (ref 5–15)
BUN: 18 mg/dL (ref 8–23)
CO2: 26 mmol/L (ref 22–32)
Calcium: 8.9 mg/dL (ref 8.9–10.3)
Chloride: 104 mmol/L (ref 98–111)
Creatinine, Ser: 1.25 mg/dL — ABNORMAL HIGH (ref 0.44–1.00)
GFR, Estimated: 45 mL/min — ABNORMAL LOW (ref >60)
Glucose, Bld: 252 mg/dL — ABNORMAL HIGH (ref 70–99)
Potassium: 3.3 mmol/L — ABNORMAL LOW (ref 3.5–5.1)
Sodium: 139 mmol/L (ref 135–145)
Total Bilirubin: 0.5 mg/dL (ref 0.3–1.2)
Total Protein: 6.6 g/dL (ref 6.5–8.1)

## 2020-09-15 LAB — LACTIC ACID, PLASMA: Lactic Acid, Venous: 1.3 mmol/L (ref 0.5–1.9)

## 2020-09-15 LAB — LACTATE DEHYDROGENASE: LDH: 248 U/L — ABNORMAL HIGH (ref 98–192)

## 2020-09-15 LAB — RESP PANEL BY RT-PCR (FLU A&B, COVID) ARPGX2
Influenza A by PCR: NEGATIVE
Influenza B by PCR: NEGATIVE
SARS Coronavirus 2 by RT PCR: POSITIVE — AB

## 2020-09-15 LAB — FERRITIN: Ferritin: 140 ng/mL (ref 11–307)

## 2020-09-15 LAB — TRIGLYCERIDES: Triglycerides: 104 mg/dL (ref <150)

## 2020-09-15 LAB — C-REACTIVE PROTEIN: CRP: 9 mg/dL — ABNORMAL HIGH (ref <1.0)

## 2020-09-15 LAB — FIBRINOGEN: Fibrinogen: 489 mg/dL — ABNORMAL HIGH (ref 210–475)

## 2020-09-15 LAB — D-DIMER, QUANTITATIVE: D-Dimer, Quant: 1.15 ug/mL-FEU — ABNORMAL HIGH (ref 0.00–0.50)

## 2020-09-15 NOTE — Telephone Encounter (Signed)
Primary Pulmonologist: Wert Last office visit and with whom: 06/02/2020 Wert What do we see them for (pulmonary problems): COPD Gold III oxygen dependent, chronic respiratory failure with hypoxia Last OV assessment/plan:   Assessment & Plan Note by Tanda Rockers, MD at 06/02/2020 1:50 PM  Author: Tanda Rockers, MD Author Type: Physician Filed: 06/02/2020  1:52 PM  Note Status: Written Cosign: Cosign Not Required Encounter Date: 06/02/2020  Problem: COPD GOLD III/ 02 dep   Editor: Tanda Rockers, MD (Physician)               Quit smoking 08/2014 - PFT's 12/09/14   FEV1 0.75 (44 % ) ratio 62  p no % improvement from saba p ? prior to study with DLCO  24 % corrects to 48 % for alv volume    - 12/09/2014 referred to rehab > started 12/2014  - 04/25/2015   try BEVESPI  - 06/20/2015  Changed by insurance> back on symb 160  - 09/22/2015 not happy with symbicort > changed back to besvespi 2bid > not happy with bevespi > changed back to symb 160 2bid and consider adding spiriva next vs trelegy trial   - 02/17/2018  After extensive coaching inhaler device,  effectiveness =    50% with hfa and 90% with elipta so rec changed to trelegy   - 05/08/2018 added pred x 6 days as "plan D"  - 01/15/2019 changed pred to 20 mg until better then 10 mg daily  - 03/29/2019  After extensive coaching inhaler device,  effectiveness =    50% with hfa  - 12/03/2019 changed to ceiling of 20 mg pred/ floor of 5 mg per day    Group D in terms of symptom/risk and laba/lama/ICS  therefore appropriate rx at this point >>>  Continue trelegy daily plus pred max 20 and min 5 mg daily plus approp saba:   I spent extra time with pt today reviewing appropriate use of albuterol for prn use on exertion with the following points: 1) saba is for relief of sob that does not improve by walking a slower pace or resting but rather if the pt does not improve after trying this first. 2) If the pt is convinced, as many are, that saba helps recover from  activity faster then it's easy to tell if this is the case by re-challenging : ie stop, take the inhaler, then p 5 minutes try the exact same activity (intensity of workload) that just caused the symptoms and see if they are substantially diminished or not after saba 3) if there is an activity that reproducibly causes the symptoms, try the saba 15 min before the activity on alternate days    If in fact the saba really does help, then fine to continue to use it prn but advised may need to look closer at the maintenance regimen being used to achieve better control of airways disease with exertion.             Patient Instructions by Tanda Rockers, MD at 06/02/2020 1:30 PM  Author: Tanda Rockers, MD Author Type: Physician Filed: 06/02/2020  1:44 PM  Note Status: Signed Cosign: Cosign Not Required Encounter Date: 06/02/2020  Editor: Tanda Rockers, MD (Physician)               No change medications    Make sure you check your oxygen saturation  at your highest level of activity  to be sure it stays over  90% and adjust  02 flow upward to maintain this level if needed but remember to turn it back to previous settings when you stop (to conserve your supply).    Please schedule a follow up visit in 6 months but call sooner if needed        Orthostatic Vitals Recorded in This Encounter   06/02/2020  1333     BP Location: Left Arm  Cuff Size: Normal   Instructions  No change medications    Make sure you check your oxygen saturation  at your highest level of activity  to be sure it stays over 90% and adjust  02 flow upward to maintain this level if needed but remember to turn it back to previous settings when you stop (to conserve your supply).    Please schedule a follow up visit in 6 months but call sooner if needed        Reason for call: Tested positive for Covid on 8/17, she states she had a moderate case.  She was not prescribed any antiviral medication.  At the beginning, she had  cough and fever, the cough is leaving (she has been using Robitussin) and the fever is gone.  Right now she is left with sob and fatigue.  She said she had some increase in sob prior to covid, but it is worse since covid.  She had been on 4L of oxygen, but she is now on 8L and feels it has helped a little bit.  She has not used her nebulizer machine, but has used her albuterol inhaler twice daily since 8/17.  She is drinking fluids, but is not eating real well.  She is still taking the prednisone 10mg  (2 tablets) as Dr. Melvyn Novas instructed her since she has been sick.  Dr. Melvyn Novas, Please advise.  Thank you.  (examples of things to ask: : When did symptoms start? Fever? Cough? Productive? Color to sputum? More sputum than usual? Wheezing? Have you needed increased oxygen? Are you taking your respiratory medications? What over the counter measures have you tried?)  No Known Allergies  Immunization History  Administered Date(s) Administered   Fluad Quad(high Dose 65+) 12/26/2018   Influenza, High Dose Seasonal PF 11/17/2016, 10/19/2017   Influenza,inj,Quad PF,6+ Mos 10/28/2014, 09/22/2015   Influenza-Unspecified 11/09/2019   PFIZER(Purple Top)SARS-COV-2 Vaccination 02/07/2019, 03/06/2019, 11/09/2019   Pneumococcal Conjugate-13 09/26/2015   Pneumococcal Polysaccharide-23 06/04/2009

## 2020-09-15 NOTE — Telephone Encounter (Signed)
ATC, left VM. 

## 2020-09-15 NOTE — ED Triage Notes (Signed)
75 y/o female coming from home via [facility] after onset of SHOB and respiratory distress that began around 2000. She has a h/o lung cancer and was told her lung capacity is only at 30%.   On EMS arrival SpO2 in the 60s, RR 50, she was placed on CPAP  Tested positive for COVID-19 on Wednesday.   EMS administered 125mg  Soumedrol, 0.3 Epi IM, 2gm Magnesium, and 10 of Albuterol and 1 Atrovent.   EMS vitals CPAP vitals 92% Spo2 RR 44 HR 120, ST SBP 180 12 lead ECG completed  IV 18g Left AC

## 2020-09-15 NOTE — ED Notes (Signed)
Lab and radiology at bedside

## 2020-09-15 NOTE — Telephone Encounter (Signed)
ATC x1, no answer, no vm set up.

## 2020-09-15 NOTE — Telephone Encounter (Signed)
A little late for paxlovid and now needing more 02 so really no choice but to go to ER ASAP

## 2020-09-15 NOTE — ED Provider Notes (Signed)
[facility] EMERGENCY DEPARTMENT Provider Note   CSN: [id number] Arrival date & time: 09/15/20  2101     History Chief Complaint  Patient presents with  . Shortness of Breath    Suzanne Dickson is a 75 y.o. female.  Vaccinated x 2, boosted x1 for COVID. Did not take medication for COVID.  The history is provided by the EMS personnel. The history is limited by the condition of the patient.  Shortness of Breath Severity:  Severe Onset quality:  Gradual Duration:  5 days Timing:  Constant Progression:  Worsening Chronicity:  New Context comment:  Positive for COVID, h/o lung cancer, COPD and uses 4-8 L [place] Relieved by:  Nothing Worsened by:  Activity Ineffective treatments: Given duo-neb x1, epi, and solu-medrol. Associated symptoms: cough   Associated symptoms: no abdominal pain, no chest pain, no ear pain, no fever, no rash, no sore throat and no vomiting      This patient's chart has been marked for a merge.  Therefore, past medical and surgical history, home meds, family history are all in the other record. They have been reviewed.  Past Medical History:  Diagnosis Date  . Adenocarcinoma, lung ([place])   . COPD (chronic obstructive pulmonary disease) ([place])   . Coronary artery disease   . OSA (obstructive sleep apnea)     There are no problems to display for this patient.   OB History   No obstetric history on file.     No family history on file.     Home Medications Prior to Admission medications   Not on File    Allergies    Patient has no allergy information on record.  Review of Systems   Review of Systems  Constitutional:  Negative for chills and fever.  HENT:  Negative for ear pain and sore throat.   Eyes:  Negative for pain and visual disturbance.  Respiratory:  Positive for cough and shortness of breath.   Cardiovascular:  Negative for chest pain and palpitations.  Gastrointestinal:  Negative for abdominal pain and  vomiting.  Genitourinary:  Negative for dysuria and hematuria.  Musculoskeletal:  Negative for arthralgias and back pain.  Skin:  Negative for color change and rash.  Neurological:  Negative for seizures and syncope.  All other systems reviewed and are negative.  Physical Exam Updated Vital Signs BP 122/65   Pulse 90   Temp (!) 97.5 F (36.4 C) (Axillary)   Resp (!) 22   Ht 5\' 2"  (1.575 m)   Wt 72.6 kg   SpO2 97%   BMI 29.26 kg/m   Physical Exam Constitutional:      General: She is in acute distress.  HENT:     Head: Normocephalic and atraumatic.  Eyes:     Comments: Conjunctivae normal  Cardiovascular:     Rate and Rhythm: Regular rhythm. Tachycardia present.  Pulmonary:     Effort: Tachypnea and accessory muscle usage present.     Breath sounds: Examination of the right-upper field reveals wheezing. Examination of the left-upper field reveals wheezing. Examination of the right-middle field reveals wheezing. Examination of the left-middle field reveals wheezing. Examination of the right-lower field reveals wheezing. Examination of the left-lower field reveals wheezing. Wheezing present.  Chest:     Chest wall: No deformity or crepitus.  Musculoskeletal:     Right lower leg: No edema.     Left lower leg: No edema.  Skin:    Comments: Cool, dry  Neurological:  General: No focal deficit present.  Psychiatric:        Mood and Affect: Mood is anxious.    ED Results / Procedures / Treatments   Labs (all labs ordered are listed, but only abnormal results are displayed) Labs Reviewed  CBC WITH DIFFERENTIAL/PLATELET - Abnormal; Notable for the following components:      Result Value   RBC 3.15 (*)    Hemoglobin 9.8 (*)    HCT 32.3 (*)    MCV 102.5 (*)    All other components within normal limits  COMPREHENSIVE METABOLIC PANEL - Abnormal; Notable for the following components:   Potassium 3.3 (*)    Glucose, Bld 252 (*)    Creatinine, Ser 1.25 (*)    Albumin 3.4  (*)    GFR, Estimated 45 (*)    All other components within normal limits  D-DIMER, QUANTITATIVE - Abnormal; Notable for the following components:   D-Dimer, Quant 1.15 (*)    All other components within normal limits  LACTATE DEHYDROGENASE - Abnormal; Notable for the following components:   LDH 248 (*)    All other components within normal limits  FIBRINOGEN - Abnormal; Notable for the following components:   Fibrinogen 489 (*)    All other components within normal limits  C-REACTIVE PROTEIN - Abnormal; Notable for the following components:   CRP 9.0 (*)    All other components within normal limits  I-STAT ARTERIAL BLOOD GAS, ED - Abnormal; Notable for the following components:   pH, Arterial 7.329 (*)    pCO2 arterial 51.6 (*)    pO2, Arterial 41 (*)    Potassium 3.2 (*)    HCT 29.0 (*)    Hemoglobin 9.9 (*)    All other components within normal limits  RESP PANEL BY RT-PCR (FLU A&B, COVID) ARPGX2  CULTURE, BLOOD (ROUTINE X 2)  CULTURE, BLOOD (ROUTINE X 2)  LACTIC ACID, PLASMA  FERRITIN  TRIGLYCERIDES  LACTIC ACID, PLASMA  PROCALCITONIN  BLOOD GAS, ARTERIAL    EKG None  Radiology DG Chest Port 1 View  Result Date: 09/15/2020 CLINICAL DATA:  History of lung cancer, COVID-19 EXAM: PORTABLE CHEST 1 VIEW COMPARISON:  12/22/2018, 03/12/2020 FINDINGS: Single frontal view of the chest demonstrates post therapeutic changes at the left hilum, stable. There has been interval development of interstitial and ground-glass opacities at the lung bases, left greater than right, compatible with infection given history of COVID-19. No effusion or pneumothorax. Background emphysema again noted. IMPRESSION: 1. Bibasilar interstitial and ground-glass opacities consistent with atypical pneumonia such as COVID-19. 2. Stable post therapeutic changes at the left hilum, compatible with previous history of lung cancer. Electronically Signed   By: Randa Ngo M.D.   On: 09/15/2020 21:39     Procedures .Critical Care  Date/Time: 09/15/2020 11:46 PM Performed by: Arnaldo Natal, MD Authorized by: Arnaldo Natal, MD   Critical care provider statement:    Critical care time (minutes):  30   Critical care time was exclusive of:  Separately billable procedures and treating other patients and teaching time   Critical care was necessary to treat or prevent imminent or life-threatening deterioration of the following conditions:  Respiratory failure   Critical care was time spent personally by me on the following activities:  Development of treatment plan with patient or surrogate, discussions with consultants, evaluation of patient's response to treatment, examination of patient, obtaining history from patient or surrogate, ordering and performing treatments and interventions, ordering and review of laboratory  studies, ordering and review of radiographic studies, pulse oximetry, re-evaluation of patient's condition and review of old charts   I assumed direction of critical care for this patient from another provider in my specialty: no     Care discussed with: admitting provider     Medications Ordered in ED Medications - No data to display  ED Course  I have reviewed the triage vital signs and the nursing notes.  Pertinent labs & imaging results that were available during my care of the patient were reviewed by me and considered in my medical decision making (see chart for details).  Clinical Course as of 09/16/20 0012  Tue Sep 16, 2020  0011 I spoke with ICU regarding admission. [AW]    Clinical Course User Index [AW] Arnaldo Natal, MD   MDM Rules/Calculators/A&P                           Suzanne Dickson is oxygen dependent for a history of COPD, lung changes from radiation for her lung cancer.  She also has a history of hypertension, pulmonary hypertension, and CAD.  She tested positive for COVID-19 about 5 days ago.  She presented via EMS after she was discovered to  have oxygen saturations of approximately 60% on her home oxygen settings.  She was treated for possible COPD with duo nebs, Solu-Medrol, and epinephrine.  She was placed on BiPAP.  She was transitioned to BiPAP here, and her ED work-up revealed elevation in inflammatory markers as well as COVID-pneumonia.  She will be admitted for further treatment. Final Clinical Impression(s) / ED Diagnoses Final diagnoses:  COVID-19  Acute on chronic respiratory failure with hypoxia ([place])  COPD exacerbation ([place])    Rx / DC Orders ED Discharge Orders     None        Arnaldo Natal, MD 09/16/20 [phone]

## 2020-09-15 NOTE — ED Notes (Signed)
Paged critical care for 3rd time for DR Joya Gaskins, no response

## 2020-09-15 NOTE — ED Notes (Signed)
Verbal report given to Missy Sabins RN

## 2020-09-15 NOTE — Telephone Encounter (Signed)
I called and spoke with patient regarding Dr. Melvyn Novas recs. He wants her to go to the ED ASAP. She verbalized understanding, nothing further needed.

## 2020-09-16 DIAGNOSIS — D539 Nutritional anemia, unspecified: Secondary | ICD-10-CM | POA: Diagnosis present

## 2020-09-16 DIAGNOSIS — J9622 Acute and chronic respiratory failure with hypercapnia: Secondary | ICD-10-CM

## 2020-09-16 DIAGNOSIS — E876 Hypokalemia: Secondary | ICD-10-CM | POA: Diagnosis present

## 2020-09-16 DIAGNOSIS — Z955 Presence of coronary angioplasty implant and graft: Secondary | ICD-10-CM | POA: Diagnosis not present

## 2020-09-16 DIAGNOSIS — J441 Chronic obstructive pulmonary disease with (acute) exacerbation: Secondary | ICD-10-CM | POA: Diagnosis present

## 2020-09-16 DIAGNOSIS — I2510 Atherosclerotic heart disease of native coronary artery without angina pectoris: Secondary | ICD-10-CM | POA: Diagnosis present

## 2020-09-16 DIAGNOSIS — F419 Anxiety disorder, unspecified: Secondary | ICD-10-CM | POA: Diagnosis present

## 2020-09-16 DIAGNOSIS — R9389 Abnormal findings on diagnostic imaging of other specified body structures: Secondary | ICD-10-CM

## 2020-09-16 DIAGNOSIS — I10 Essential (primary) hypertension: Secondary | ICD-10-CM | POA: Diagnosis present

## 2020-09-16 DIAGNOSIS — U071 COVID-19: Secondary | ICD-10-CM | POA: Diagnosis present

## 2020-09-16 DIAGNOSIS — N179 Acute kidney failure, unspecified: Secondary | ICD-10-CM | POA: Diagnosis present

## 2020-09-16 DIAGNOSIS — J1282 Pneumonia due to coronavirus disease 2019: Secondary | ICD-10-CM | POA: Diagnosis present

## 2020-09-16 DIAGNOSIS — R9431 Abnormal electrocardiogram [ECG] [EKG]: Secondary | ICD-10-CM

## 2020-09-16 DIAGNOSIS — Z85118 Personal history of other malignant neoplasm of bronchus and lung: Secondary | ICD-10-CM | POA: Diagnosis not present

## 2020-09-16 DIAGNOSIS — J9601 Acute respiratory failure with hypoxia: Secondary | ICD-10-CM | POA: Diagnosis not present

## 2020-09-16 DIAGNOSIS — Z9981 Dependence on supplemental oxygen: Secondary | ICD-10-CM | POA: Diagnosis not present

## 2020-09-16 DIAGNOSIS — Z7952 Long term (current) use of systemic steroids: Secondary | ICD-10-CM | POA: Diagnosis not present

## 2020-09-16 DIAGNOSIS — J9621 Acute and chronic respiratory failure with hypoxia: Secondary | ICD-10-CM

## 2020-09-16 DIAGNOSIS — Z7982 Long term (current) use of aspirin: Secondary | ICD-10-CM | POA: Diagnosis not present

## 2020-09-16 DIAGNOSIS — Z7951 Long term (current) use of inhaled steroids: Secondary | ICD-10-CM | POA: Diagnosis not present

## 2020-09-16 DIAGNOSIS — I272 Pulmonary hypertension, unspecified: Secondary | ICD-10-CM | POA: Diagnosis present

## 2020-09-16 DIAGNOSIS — Z923 Personal history of irradiation: Secondary | ICD-10-CM | POA: Diagnosis not present

## 2020-09-16 DIAGNOSIS — Z79899 Other long term (current) drug therapy: Secondary | ICD-10-CM | POA: Diagnosis not present

## 2020-09-16 DIAGNOSIS — E669 Obesity, unspecified: Secondary | ICD-10-CM | POA: Diagnosis present

## 2020-09-16 DIAGNOSIS — Z6831 Body mass index (BMI) 31.0-31.9, adult: Secondary | ICD-10-CM | POA: Diagnosis not present

## 2020-09-16 DIAGNOSIS — G4733 Obstructive sleep apnea (adult) (pediatric): Secondary | ICD-10-CM | POA: Diagnosis present

## 2020-09-16 LAB — GLUCOSE, CAPILLARY
Glucose-Capillary: 254 mg/dL — ABNORMAL HIGH (ref 70–99)
Glucose-Capillary: 191 mg/dL — ABNORMAL HIGH (ref 70–99)
Glucose-Capillary: 188 mg/dL — ABNORMAL HIGH (ref 70–99)
Glucose-Capillary: 156 mg/dL — ABNORMAL HIGH (ref 70–99)
Glucose-Capillary: 170 mg/dL — ABNORMAL HIGH (ref 70–99)

## 2020-09-16 LAB — BASIC METABOLIC PANEL
Anion gap: 10 (ref 5–15)
BUN: 19 mg/dL (ref 8–23)
CO2: 22 mmol/L (ref 22–32)
Calcium: 8.7 mg/dL — ABNORMAL LOW (ref 8.9–10.3)
Chloride: 107 mmol/L (ref 98–111)
Creatinine, Ser: 1.15 mg/dL — ABNORMAL HIGH (ref 0.44–1.00)
GFR, Estimated: 50 mL/min — ABNORMAL LOW (ref >60)
Glucose, Bld: 223 mg/dL — ABNORMAL HIGH (ref 70–99)
Potassium: 4.5 mmol/L (ref 3.5–5.1)
Sodium: 139 mmol/L (ref 135–145)

## 2020-09-16 LAB — MRSA NEXT GEN BY PCR, NASAL: MRSA by PCR Next Gen: NOT DETECTED

## 2020-09-16 LAB — CBC
HCT: 30.9 % — ABNORMAL LOW (ref 36.0–46.0)
Hemoglobin: 9.2 g/dL — ABNORMAL LOW (ref 12.0–15.0)
MCH: 30.3 pg (ref 26.0–34.0)
MCHC: 29.8 g/dL — ABNORMAL LOW (ref 30.0–36.0)
MCV: 101.6 fL — ABNORMAL HIGH (ref 80.0–100.0)
Platelets: 105 10*3/uL — ABNORMAL LOW (ref 150–400)
RBC: 3.04 MIL/uL — ABNORMAL LOW (ref 3.87–5.11)
RDW: 12 % (ref 11.5–15.5)
WBC: 2.8 10*3/uL — ABNORMAL LOW (ref 4.0–10.5)
nRBC: 0 % (ref 0.0–0.2)

## 2020-09-16 LAB — TROPONIN I (HIGH SENSITIVITY)
Troponin I (High Sensitivity): 66 ng/L — ABNORMAL HIGH (ref <18)
Troponin I (High Sensitivity): 88 ng/L — ABNORMAL HIGH (ref <18)

## 2020-09-16 LAB — PHOSPHORUS: Phosphorus: 3.8 mg/dL (ref 2.5–4.6)

## 2020-09-16 LAB — POCT I-STAT 7, (LYTES, BLD GAS, ICA,H+H)
Acid-Base Excess: 0 mmol/L (ref 0.0–2.0)
Bicarbonate: 25.8 mmol/L (ref 20.0–28.0)
Calcium, Ion: 1.23 mmol/L (ref 1.15–1.40)
HCT: 30 % — ABNORMAL LOW (ref 36.0–46.0)
Hemoglobin: 10.2 g/dL — ABNORMAL LOW (ref 12.0–15.0)
O2 Saturation: 96 %
Patient temperature: 99.1
Potassium: 4.3 mmol/L (ref 3.5–5.1)
Sodium: 141 mmol/L (ref 135–145)
TCO2: 27 mmol/L (ref 22–32)
pCO2 arterial: 47.3 mmHg (ref 32.0–48.0)
pH, Arterial: 7.346 — ABNORMAL LOW (ref 7.350–7.450)
pO2, Arterial: 86 mmHg (ref 83.0–108.0)

## 2020-09-16 LAB — MAGNESIUM: Magnesium: 2.1 mg/dL (ref 1.7–2.4)

## 2020-09-16 LAB — HEMOGLOBIN A1C
Hgb A1c MFr Bld: 6.4 % — ABNORMAL HIGH (ref 4.8–5.6)
Mean Plasma Glucose: 136.98 mg/dL

## 2020-09-16 LAB — CBG MONITORING, ED: Glucose-Capillary: 215 mg/dL — ABNORMAL HIGH (ref 70–99)

## 2020-09-16 LAB — PROCALCITONIN: Procalcitonin: <0.1 ng/mL

## 2020-09-16 MED ORDER — PANTOPRAZOLE SODIUM 40 MG PO TBEC
40.0000 mg | DELAYED_RELEASE_TABLET | Freq: Every day | ORAL | Status: DC
  Administered 2020-09-16 – 2020-09-22 (×7): 40 mg via ORAL
  Filled 2020-09-16 (×7): qty 1

## 2020-09-16 MED ORDER — SERTRALINE HCL 100 MG PO TABS
100.0000 mg | ORAL_TABLET | Freq: Every day | ORAL | Status: DC
  Administered 2020-09-16 – 2020-09-21 (×6): 100 mg via ORAL
  Filled 2020-09-16 (×6): qty 1

## 2020-09-16 MED ORDER — METOPROLOL SUCCINATE ER 25 MG PO TB24
25.0000 mg | ORAL_TABLET | Freq: Every day | ORAL | Status: DC
  Administered 2020-09-16 – 2020-09-22 (×7): 25 mg via ORAL
  Filled 2020-09-16 (×7): qty 1

## 2020-09-16 MED ORDER — DOCUSATE SODIUM 100 MG PO CAPS: 100.0000 mg | ORAL_CAPSULE | Freq: Two times a day (BID) | ORAL | Status: DC | PRN

## 2020-09-16 MED ORDER — INSULIN ASPART 100 UNIT/ML IJ SOLN: 0.0000 [IU] | Freq: Three times a day (TID) | INTRAMUSCULAR | Status: DC

## 2020-09-16 MED ORDER — FERROUS SULFATE 325 (65 FE) MG PO TABS
325.0000 mg | ORAL_TABLET | Freq: Every day | ORAL | Status: DC
Start: 2020-09-16 — End: 2020-09-22
  Administered 2020-09-16 – 2020-09-22 (×7): 325 mg via ORAL
  Filled 2020-09-16 (×7): qty 1

## 2020-09-16 MED ORDER — IPRATROPIUM-ALBUTEROL 20-100 MCG/ACT IN AERS
1.0000 | INHALATION_SPRAY | Freq: Four times a day (QID) | RESPIRATORY_TRACT | Status: DC
  Administered 2020-09-16 – 2020-09-17 (×5): 1 via RESPIRATORY_TRACT
  Filled 2020-09-16: qty 4

## 2020-09-16 MED ORDER — ALBUTEROL SULFATE (2.5 MG/3ML) 0.083% IN NEBU: 2.5000 mg | INHALATION_SOLUTION | Freq: Four times a day (QID) | RESPIRATORY_TRACT | Status: DC | PRN

## 2020-09-16 MED ORDER — POLYETHYLENE GLYCOL 3350 17 G PO PACK: 17.0000 g | PACK | Freq: Every day | ORAL | Status: DC | PRN

## 2020-09-16 MED ORDER — SODIUM CHLORIDE 0.9 % IV SOLN
100.0000 mg | Freq: Every day | INTRAVENOUS | Status: AC
  Administered 2020-09-17 – 2020-09-20 (×4): 100 mg via INTRAVENOUS
  Filled 2020-09-16: qty 20
  Filled 2020-09-16: qty 100
  Filled 2020-09-16 (×2): qty 20

## 2020-09-16 MED ORDER — ALBUTEROL SULFATE HFA 108 (90 BASE) MCG/ACT IN AERS
1.0000 | INHALATION_SPRAY | Freq: Four times a day (QID) | RESPIRATORY_TRACT | Status: DC | PRN
  Administered 2020-09-17: 2 via RESPIRATORY_TRACT
  Administered 2020-09-18: 1 via RESPIRATORY_TRACT
  Filled 2020-09-16 (×2): qty 6.7

## 2020-09-16 MED ORDER — POTASSIUM CHLORIDE CRYS ER 20 MEQ PO TBCR
40.0000 meq | EXTENDED_RELEASE_TABLET | Freq: Once | ORAL | Status: AC
  Administered 2020-09-16: 40 meq via ORAL
  Filled 2020-09-16: qty 2

## 2020-09-16 MED ORDER — BUDESONIDE 0.25 MG/2ML IN SUSP
0.2500 mg | Freq: Two times a day (BID) | RESPIRATORY_TRACT | Status: DC
  Filled 2020-09-16 (×3): qty 2

## 2020-09-16 MED ORDER — METHYLPREDNISOLONE SODIUM SUCC 125 MG IJ SOLR
125.0000 mg | INTRAMUSCULAR | Status: DC
Start: 2020-09-16 — End: 2020-09-19
  Administered 2020-09-16 – 2020-09-18 (×3): 125 mg via INTRAVENOUS
  Filled 2020-09-16 (×3): qty 2

## 2020-09-16 MED ORDER — CHLORHEXIDINE GLUCONATE CLOTH 2 % EX PADS
6.0000 | MEDICATED_PAD | Freq: Every day | CUTANEOUS | Status: DC
  Administered 2020-09-16 – 2020-09-21 (×3): 6 via TOPICAL

## 2020-09-16 MED ORDER — METHYLPREDNISOLONE SODIUM SUCC 125 MG IJ SOLR
90.0000 mg | Freq: Two times a day (BID) | INTRAMUSCULAR | Status: DC
  Administered 2020-09-16: 90 mg via INTRAVENOUS
  Filled 2020-09-16: qty 2

## 2020-09-16 MED ORDER — SODIUM CHLORIDE 0.9 % IV SOLN
200.0000 mg | Freq: Once | INTRAVENOUS | Status: AC
  Administered 2020-09-16: 200 mg via INTRAVENOUS
  Filled 2020-09-16: qty 40

## 2020-09-16 MED ORDER — METHYLPREDNISOLONE SODIUM SUCC 125 MG IJ SOLR: 60.0000 mg | Freq: Three times a day (TID) | INTRAMUSCULAR | Status: DC

## 2020-09-16 MED ORDER — METHYLPREDNISOLONE SODIUM SUCC 40 MG IJ SOLR
40.0000 mg | Freq: Two times a day (BID) | INTRAMUSCULAR | Status: DC
  Administered 2020-09-16: 40 mg via INTRAVENOUS
  Filled 2020-09-16: qty 1

## 2020-09-16 MED ORDER — INSULIN ASPART 100 UNIT/ML IJ SOLN
0.0000 [IU] | Freq: Three times a day (TID) | INTRAMUSCULAR | Status: DC
  Administered 2020-09-16: 4 [IU] via SUBCUTANEOUS
  Administered 2020-09-17: 3 [IU] via SUBCUTANEOUS
  Administered 2020-09-17: 4 [IU] via SUBCUTANEOUS
  Administered 2020-09-17 – 2020-09-18 (×2): 7 [IU] via SUBCUTANEOUS
  Administered 2020-09-18: 4 [IU] via SUBCUTANEOUS
  Administered 2020-09-18: 7 [IU] via SUBCUTANEOUS
  Administered 2020-09-19: 11 [IU] via SUBCUTANEOUS
  Administered 2020-09-19 (×2): 4 [IU] via SUBCUTANEOUS
  Administered 2020-09-20 (×2): 7 [IU] via SUBCUTANEOUS
  Administered 2020-09-20 – 2020-09-21 (×2): 4 [IU] via SUBCUTANEOUS
  Administered 2020-09-21: 7 [IU] via SUBCUTANEOUS
  Administered 2020-09-21: 11 [IU] via SUBCUTANEOUS
  Administered 2020-09-22: 7 [IU] via SUBCUTANEOUS
  Administered 2020-09-22 (×2): 4 [IU] via SUBCUTANEOUS

## 2020-09-16 MED ORDER — BUDESONIDE 180 MCG/ACT IN AEPB
1.0000 | INHALATION_SPRAY | Freq: Two times a day (BID) | RESPIRATORY_TRACT | Status: DC
  Administered 2020-09-16 – 2020-09-22 (×12): 1 via RESPIRATORY_TRACT
  Filled 2020-09-16: qty 1

## 2020-09-16 MED ORDER — INSULIN ASPART 100 UNIT/ML IJ SOLN
0.0000 [IU] | INTRAMUSCULAR | Status: DC
  Administered 2020-09-16: 7 [IU] via SUBCUTANEOUS
  Administered 2020-09-16: 11 [IU] via SUBCUTANEOUS
  Administered 2020-09-16: 4 [IU] via SUBCUTANEOUS

## 2020-09-16 MED ORDER — ASPIRIN EC 81 MG PO TBEC
81.0000 mg | DELAYED_RELEASE_TABLET | Freq: Every day | ORAL | Status: DC
  Administered 2020-09-16 – 2020-09-22 (×7): 81 mg via ORAL
  Filled 2020-09-16 (×7): qty 1

## 2020-09-16 MED ORDER — LOSARTAN POTASSIUM 50 MG PO TABS
50.0000 mg | ORAL_TABLET | Freq: Every day | ORAL | Status: DC
  Administered 2020-09-16 – 2020-09-22 (×7): 50 mg via ORAL
  Filled 2020-09-16 (×7): qty 1

## 2020-09-16 MED ORDER — IPRATROPIUM-ALBUTEROL 0.5-2.5 (3) MG/3ML IN SOLN: 3.0000 mL | Freq: Four times a day (QID) | RESPIRATORY_TRACT | Status: DC

## 2020-09-16 MED ORDER — VITAMIN D 25 MCG (1000 UNIT) PO TABS
1000.0000 [IU] | ORAL_TABLET | Freq: Every day | ORAL | Status: DC
  Administered 2020-09-16 – 2020-09-22 (×7): 1000 [IU] via ORAL
  Filled 2020-09-16 (×9): qty 1

## 2020-09-16 MED ORDER — AMLODIPINE BESYLATE 5 MG PO TABS
5.0000 mg | ORAL_TABLET | Freq: Every day | ORAL | Status: DC
  Administered 2020-09-16 – 2020-09-22 (×7): 5 mg via ORAL
  Filled 2020-09-16 (×7): qty 1

## 2020-09-16 MED ORDER — HEPARIN SODIUM (PORCINE) 5000 UNIT/ML IJ SOLN
5000.0000 [IU] | Freq: Three times a day (TID) | INTRAMUSCULAR | Status: DC
  Administered 2020-09-17 – 2020-09-22 (×17): 5000 [IU] via SUBCUTANEOUS
  Filled 2020-09-16 (×17): qty 1

## 2020-09-16 NOTE — Plan of Care (Signed)
  Problem: Clinical Measurements: Goal: Ability to maintain clinical measurements within normal limits will improve Outcome: Not Progressing Goal: Will remain free from infection Outcome: Not Progressing Goal: Diagnostic test results will improve Outcome: Not Progressing

## 2020-09-16 NOTE — Progress Notes (Addendum)
NAME:  Suzanne Dickson, MRN:  [id number], DOB:  1945/06/21, LOS: 0 ADMISSION DATE:  09/15/2020 CONSULTATION DATE:  09/15/2020 REFERRING MD:  Joya Gaskins - EDP CHIEF COMPLAINT:  Hypoxia, COVID+   History of Present Illness:  75 year old woman with PMHx significant for HTN, CAD, pulmonary HTN, OSA, COPD (on home O2 4-7L) and lung adenocarcinoma who presented to [facility] ED via EMS 8/22 for SOB, decreased O2 sats. Per EMS, patient was satting in 44s on her home O2 on arrival. Treated for AECOPD in the field with Solumedrol, Duo-Neb, and Epi. Patient tested positive for COVID on 8/17 (previously vaccinated x 2 and boosted x 1) but did not receive treatment.  On arrival to ED, patient transitioned to BiPAP (9/5, FiO2 45%) with improvement in O2 sats to 90s, RR to 20s (from 50s) and subjective relief. CXR demonstrated bibasilar interstitial and GGO consistent with atypical PNA. Labs were notable for mild anemia (Hgb 9.3), elevated D-Dimer, K 3.3,  glucose 252, Cr 1.25 (baseline 0.9-1.1).  PCCM consulted for ICU admission and COVID management.  Pertinent Medical History:  Adenocarcinoma of the lung  CAD HTN OSA  Pulmonary HTN  Significant Hospital Events:  8/22 - Contacted EMS at advice of Pulmonary MD for increased SOB, decreased O2 sats. Sats 60% on EMS arrival on home O2 7L. Positive for COVID 8/17. Solumedrol, Epi, Duo-Neb given with some relief. Placed on BiPAP on arrival to ED with clinical improvement. CCM consulted for admission. 8/23 no acute events overnight  Interim History / Subjective:  States she feels much better with improved respirations   Objective:  Blood pressure 114/68, pulse 74, temperature 99.1 F (37.3 C), temperature source Axillary, resp. rate 20, height 5\' 2"  (1.575 m), weight 74.7 kg, SpO2 100 %.    FiO2 (%):  [45 %] 45 %   Intake/Output Summary (Last 24 hours) at 09/16/2020 0716 Last data filed at 09/16/2020 0600 Gross per 24 hour  Intake 349.29 ml  Output 300 ml  Net  49.29 ml   Filed Weights   09/15/20 2111 09/16/20 0254  Weight: 72.6 kg 74.7 kg   Physical Examination: General: Acute on chronically ill appearing elderly female lying in bed, in NAD HEENT: [place]/AT, MM pink/moist, PERRL,  Neuro: Alert and oriented x3, non-focal CV: s1s2 regular rate and rhythm, no murmur, rubs, or gallops,  PULM:  Slightly diminished but no wheezing or tachypnea, oxygen sats appropriate on 8L [place] GI: soft, bowel sounds active in all 4 quadrants, non-tender, non-distended, tolerating oral diet  Extremities: warm/dry, no edema  Skin: no rashes or lesions  Resolved Hospital Problem List     Assessment & Plan:  Acute hypoxic respiratory failure in the setting of likely AECOPD and COVID PNA COVID-19 infection -Patient contacted EMS at advice of pulmonary provider for increased SOB/O2 needs in the setting of recent COVID diagnosis. Tested positive for COVID 8/17. Patient was noted to have O2 sat of 60% on home O2.  COPD Gold III with acute exacerbation OSA -Followed by Dr. Melvyn Novas for management of COPD/OSA -Home oxygen of 4-6L continuously P: BIPAP as needed  Wean FIO2 for sats greater then 88% Continue BDs Pulmonary hygiene  Continue Remdesivir per protocol  Steroids per protocol  Cessation education when appropriate  Adequate pulmonary hygiene  PRN BIPAP   History of HTN -Home medication include; Norvasc, HCTZ, Cozaar, Toprol-XL, History of CAD -Medication reconciliation revealed both ASA and Plavix for cardiac stent placed 2019. Spoke to Dr. Lona Kettle P: Resume home medications MAP goal >  65  Hypokalemia Mild AKI (baseline Cr 0.9-1.1) P: Follow renal function  Monitor urine output Trend Bmet Avoid nephrotoxins Ensure adequate renal perfusion  Supplement potassium as needed   History of lung adenocarcinoma -Remote history of lung adenocarcinoma, s/p radiation therapy. P: Continue outpatient surveillance   Best Practice:    Diet/type: NPO w/ oral  meds DVT prophylaxis: prophylactic heparin  GI prophylaxis: N/A Lines: N/A Foley: N/A Code Status:  full code Last date of multidisciplinary goals of care discussion [Pending]  Critical care time: 40 minutes   Performed by: Zoe Creasman D. Harris  Total critical care time: 34 minutes  Critical care time was exclusive of separately billable procedures and treating other patients.  Critical care was necessary to treat or prevent imminent or life-threatening deterioration.  Critical care was time spent personally by me on the following activities: development of treatment plan with patient and/or surrogate as well as nursing, discussions with consultants, evaluation of patient's response to treatment, examination of patient, obtaining history from patient or surrogate, ordering and performing treatments and interventions, ordering and review of laboratory studies, ordering and review of radiographic studies, pulse oximetry and re-evaluation of patient's condition.  Labron Bloodgood D. Kenton Kingfisher, NP-C [facility] Personal contact information can be found on Amion  09/16/2020, 7:39 AM

## 2020-09-16 NOTE — Progress Notes (Signed)
eLink Physician-Brief Progress Note Patient Name: Suzanne Dickson DOB: 03-16-1945 MRN: [id number]   Date of Service  09/16/2020  HPI/Events of Note  Brief HPI: 75 year old woman with PMHx significant for HTN, CAD, pulmonary HTN, OSA, COPD (on home O2 4-7L) and lung adenocarcinoma who presented to [facility] ED via EMS 8/22 for SOB, decreased O2 sats. Treated for AECOPD in the field with Solumedrol, Duo-Neb, and Epi. Patient tested positive for COVID on 8/17 (previously vaccinated x 2 and boosted x 1) but did not receive treatment  Notes, labs reviewed. mild anemia (Hgb 9.3), elevated D-Dimer, K 3.3,  glucose 252, Cr 1.25 (baseline 0.9-1.1).  Camera: Obese, tolerating BiPAP 45% , Ve 11 lit. VS stable, HR 86, sats 99%, 125/67  A/P: Acute hypoxic respiratory failure in the setting of likely AECOPD and COVID PNA HTN/CAD 3. AKI, low K 4. Lung adeno ca, remote, s/p RT. On surveillance protocol.  eICU Interventions  NP notes, orders reviewed. Agree for the plan of care. On VTE sq heparin. On ssi, goal < 180 Asp precautions. On antiviral Rx.  Follow Cx.      Intervention Category Major Interventions: Respiratory failure - evaluation and management Evaluation Type: New Patient Evaluation  Elmer Sow 09/16/2020, 2:59 AM

## 2020-09-16 NOTE — H&P (Signed)
NAME:  Suzanne Dickson, MRN:  [id number], DOB:  15-Nov-1945, LOS: 0 ADMISSION DATE:  09/15/2020 CONSULTATION DATE:  09/15/2020 REFERRING MD:  Joya Gaskins - EDP CHIEF COMPLAINT:  Hypoxia, COVID+   History of Present Illness:  75 year old woman with PMHx significant for HTN, CAD, pulmonary HTN, OSA, COPD (on home O2 4-7L) and lung adenocarcinoma who presented to [facility] ED via EMS 8/22 for SOB, decreased O2 sats. Per EMS, patient was satting in 2s on her home O2 on arrival. Treated for AECOPD in the field with Solumedrol, Duo-Neb, and Epi. Patient tested positive for COVID on 8/17 (previously vaccinated x 2 and boosted x 1) but did not receive treatment.  On arrival to ED, patient transitioned to BiPAP (9/5, FiO2 45%) with improvement in O2 sats to 90s, RR to 20s (from 50s) and subjective relief. CXR demonstrated bibasilar interstitial and GGO consistent with atypical PNA. Labs were notable for mild anemia (Hgb 9.3), elevated D-Dimer, K 3.3,  glucose 252, Cr 1.25 (baseline 0.9-1.1).  PCCM consulted for ICU admission and COVID management.  Pertinent Medical History:   Past Medical History:  Diagnosis Date   Adenocarcinoma, lung (HCC)    COPD (chronic obstructive pulmonary disease) (HCC)    Coronary artery disease    Hypertension    OSA (obstructive sleep apnea)    Pulmonary hypertension ([place])    Significant Hospital Events: Including procedures, antibiotic start and stop dates in addition to other pertinent events   8/22 - Contacted EMS at advice of Pulmonary MD for increased SOB, decreased O2 sats. Sats 60% on EMS arrival on home O2 7L. Positive for COVID 8/17. Solumedrol, Epi, Duo-Neb given with some relief. Placed on BiPAP on arrival to ED with clinical improvement. CCM consulted for admission.  Interim History / Subjective:  PCCM consulted for admission  Objective:  Blood pressure 121/74, pulse 81, temperature (!) 97.5 F (36.4 C), temperature source Axillary, resp. rate (!) 22, height 5\' 2"   (1.575 m), weight 72.6 kg, SpO2 99 %.    FiO2 (%):  [45 %] 45 %  No intake or output data in the 24 hours ending 09/16/20 0033 Filed Weights   09/15/20 2111  Weight: 72.6 kg   Physical Examination: General: Chronically ill-appearing elderly woman in NAD. HEENT: [place]/AT, anicteric sclera, PERRL, slightly dry mucous membranes. BiPAP mask in place. Neuro: Awake, oriented x 4. Responds to verbal stimuli. Following commands consistently. Moves all 4 extremities spontaneously. CV: RRR, no m/g/r. PULM: Breathing even and unlabored on BiPAP. Lung fields with diffuse expiratory wheeze, fair air movement. GI: Soft, nontender, nondistended. Normoactive bowel sounds. Extremities: No LE edema noted. Skin: Warm/dry, no rashes.  Resolved Hospital Problem List     Assessment & Plan:  Acute hypoxic respiratory failure in the setting of likely AECOPD and COVID PNA COVID-19 infection Patient contacted EMS at advice of pulmonary provider for increased SOB/O2 needs in the setting of recent COVID diagnosis. Tested positive for COVID 8/17. Patient was noted to have O2 sat of 60% on home O2. BIB EMS to [facility] ED for further management. - Admit to ICU in the setting of COVID and BiPAP circuit - Continue BiPAP support - Wean FiO2 for O2 sat 88-92% - Continue bronchodilators (Duo-Neb, Pulmicort, Albuterol PRN) - Pulmonary hygiene - Remdesivir per protocol - Steroids per protocol  COPD Gold III with acute exacerbation OSA Followed by Dr. Melvyn Novas for management of COPD/OSA. - Continue bronchodilators as above - Goal O2 sat 88-92% - Steroids as above - Pulmonary hygiene  History of HTN History of CAD - Hold home antihypertensives at present - Resume as clinically appropriate - MAP goal > 65  Hypokalemia Mild AKI (baseline Cr 0.9-1.1) - Trend BMP - Replete electrolytes as indicated - Monitor I&Os - Avoid nephrotoxic agents as able - Ensure adequate renal perfusion  History of lung  adenocarcinoma Remote history of lung adenocarcinoma, s/p radiation therapy. - Continued surveillance/monitoring per protocol  Best Practice: (right click and "Reselect all SmartList Selections" daily)   Diet/type: NPO w/ oral meds DVT prophylaxis: prophylactic heparin  GI prophylaxis: N/A Lines: N/A Foley: N/A Code Status:  full code Last date of multidisciplinary goals of care discussion [Pending]  Labs:  CBC: Recent Labs  Lab 09/15/20 2106 09/15/20 2133  WBC 9.9  --   NEUTROABS 6.6  --   HGB 9.8* 9.9*  HCT 32.3* 29.0*  MCV 102.5*  --   PLT 158  --    Basic Metabolic Panel: Recent Labs  Lab 09/15/20 2106 09/15/20 2133  NA 139 142  K 3.3* 3.2*  CL 104  --   CO2 26  --   GLUCOSE 252*  --   BUN 18  --   CREATININE 1.25*  --   CALCIUM 8.9  --    GFR: Estimated Creatinine Clearance: 36.8 mL/min (A) (by C-G formula based on SCr of 1.25 mg/dL (H)). Recent Labs  Lab 09/15/20 2106  WBC 9.9  LATICACIDVEN 1.3   Liver Function Tests: Recent Labs  Lab 09/15/20 2106  AST 39  ALT 28  ALKPHOS 41  BILITOT 0.5  PROT 6.6  ALBUMIN 3.4*   No results for input(s): LIPASE, AMYLASE in the last 168 hours. No results for input(s): AMMONIA in the last 168 hours.  ABG:    Component Value Date/Time   PHART 7.329 (L) 09/15/2020 2133   PCO2ART 51.6 (H) 09/15/2020 2133   PO2ART 41 (L) 09/15/2020 2133   HCO3 27.2 09/15/2020 2133   TCO2 29 09/15/2020 2133   O2SAT 71.0 09/15/2020 2133    Coagulation Profile: No results for input(s): INR, PROTIME in the last 168 hours.  Cardiac Enzymes: No results for input(s): CKTOTAL, CKMB, CKMBINDEX, TROPONINI in the last 168 hours.  HbA1C: No results found for: HGBA1C  CBG: No results for input(s): GLUCAP in the last 168 hours.  Review of Systems:   Review of systems completed with pertinent positives/negatives outlined in above HPI.  Past Medical History:  She,  has a past medical history of Adenocarcinoma, lung ([place]), COPD  (chronic obstructive pulmonary disease) ([place]), Coronary artery disease, Hypertension, OSA (obstructive sleep apnea), and Pulmonary hypertension ([place]).   Surgical History:  Lap chole, tonsillectomy, tubal ligation, ?left lung surgery  Social History:      Family History:  Her family history is not on file.   Allergies: No Known Allergies   Home Medications: Prior to Admission medications   Medication Sig Start Date End Date Taking? Authorizing Provider  amLODipine (NORVASC) 5 MG tablet Take 5 mg by mouth daily.   Yes [provider]  aspirin EC 81 MG tablet Take 81 mg by mouth daily. Swallow whole.   Yes [provider]  cholecalciferol (VITAMIN D) 25 MCG (1000 UNIT) tablet Take 1,000 Units by mouth daily.   Yes [provider]  FERROUS SULFATE PO Take 3 tablets by mouth daily at 2 PM. Unsure of strength   Yes [provider]  hydrochlorothiazide (MICROZIDE) 12.5 MG capsule Take 12.5 mg by mouth daily.   Yes  [provider]  losartan (COZAAR) 50 MG tablet Take 50 mg by mouth daily.   Yes [provider]  metoprolol succinate (TOPROL-XL) 25 MG 24 hr tablet Take 25 mg by mouth daily.   Yes [provider]  OXYGEN Inhale 6 L into the lungs continuous.   Yes [provider]  pantoprazole (PROTONIX) 40 MG tablet Take 40 mg by mouth daily.   Yes [provider]  predniSONE (DELTASONE) 10 MG tablet Take 10-20 mg by mouth See admin instructions. Take 2 tabs daily until feeling better then 1 tab daily   Yes [provider]  sertraline (ZOLOFT) 100 MG tablet Take 100 mg by mouth daily.   Yes [provider]    Critical care time: 40 minutes   Lestine Mount, PA-C [facility] 09/16/20 12:33 AM  Please see [email or web address] for pager details.  From 7A-7P if no response, please call [phone] After hours, please call ELink [phone]

## 2020-09-16 NOTE — Progress Notes (Signed)
Patient admitted to [phone]. Belongings include adult briefs, cell phone, clothing. VS stable. Patient on 8L [place]. Call bell within reach.

## 2020-09-17 ENCOUNTER — Inpatient Hospital Stay (HOSPITAL_COMMUNITY): Payer: Medicare PPO

## 2020-09-17 DIAGNOSIS — J9621 Acute and chronic respiratory failure with hypoxia: Secondary | ICD-10-CM | POA: Diagnosis not present

## 2020-09-17 DIAGNOSIS — J1282 Pneumonia due to coronavirus disease 2019: Secondary | ICD-10-CM | POA: Diagnosis not present

## 2020-09-17 DIAGNOSIS — J441 Chronic obstructive pulmonary disease with (acute) exacerbation: Secondary | ICD-10-CM

## 2020-09-17 DIAGNOSIS — U071 COVID-19: Secondary | ICD-10-CM | POA: Diagnosis not present

## 2020-09-17 DIAGNOSIS — D539 Nutritional anemia, unspecified: Secondary | ICD-10-CM | POA: Diagnosis not present

## 2020-09-17 LAB — CBC
HCT: 31.8 % — ABNORMAL LOW (ref 36.0–46.0)
Hemoglobin: 9.5 g/dL — ABNORMAL LOW (ref 12.0–15.0)
MCH: 30.1 pg (ref 26.0–34.0)
MCHC: 29.9 g/dL — ABNORMAL LOW (ref 30.0–36.0)
MCV: 100.6 fL — ABNORMAL HIGH (ref 80.0–100.0)
Platelets: 150 10*3/uL (ref 150–400)
RBC: 3.16 MIL/uL — ABNORMAL LOW (ref 3.87–5.11)
RDW: 11.9 % (ref 11.5–15.5)
WBC: 6.5 10*3/uL (ref 4.0–10.5)
nRBC: 0 % (ref 0.0–0.2)

## 2020-09-17 LAB — COMPREHENSIVE METABOLIC PANEL
ALT: 35 U/L (ref 0–44)
AST: 47 U/L — ABNORMAL HIGH (ref 15–41)
Albumin: 3.3 g/dL — ABNORMAL LOW (ref 3.5–5.0)
Alkaline Phosphatase: 34 U/L — ABNORMAL LOW (ref 38–126)
Anion gap: 6 (ref 5–15)
BUN: 25 mg/dL — ABNORMAL HIGH (ref 8–23)
CO2: 30 mmol/L (ref 22–32)
Calcium: 8.9 mg/dL (ref 8.9–10.3)
Chloride: 105 mmol/L (ref 98–111)
Creatinine, Ser: 1.12 mg/dL — ABNORMAL HIGH (ref 0.44–1.00)
GFR, Estimated: 52 mL/min — ABNORMAL LOW (ref >60)
Glucose, Bld: 205 mg/dL — ABNORMAL HIGH (ref 70–99)
Potassium: 4.5 mmol/L (ref 3.5–5.1)
Sodium: 141 mmol/L (ref 135–145)
Total Bilirubin: 0.6 mg/dL (ref 0.3–1.2)
Total Protein: 6.6 g/dL (ref 6.5–8.1)

## 2020-09-17 LAB — GLUCOSE, CAPILLARY
Glucose-Capillary: 126 mg/dL — ABNORMAL HIGH (ref 70–99)
Glucose-Capillary: 172 mg/dL — ABNORMAL HIGH (ref 70–99)
Glucose-Capillary: 193 mg/dL — ABNORMAL HIGH (ref 70–99)
Glucose-Capillary: 205 mg/dL — ABNORMAL HIGH (ref 70–99)
Glucose-Capillary: 214 mg/dL — ABNORMAL HIGH (ref 70–99)
Glucose-Capillary: 224 mg/dL — ABNORMAL HIGH (ref 70–99)

## 2020-09-17 LAB — C-REACTIVE PROTEIN: CRP: 4.9 mg/dL — ABNORMAL HIGH (ref <1.0)

## 2020-09-17 LAB — D-DIMER, QUANTITATIVE: D-Dimer, Quant: 1.12 ug/mL-FEU — ABNORMAL HIGH (ref 0.00–0.50)

## 2020-09-17 LAB — BRAIN NATRIURETIC PEPTIDE: B Natriuretic Peptide: 206.1 pg/mL — ABNORMAL HIGH (ref 0.0–100.0)

## 2020-09-17 MED ORDER — GUAIFENESIN-DM 100-10 MG/5ML PO SYRP
15.0000 mL | ORAL_SOLUTION | ORAL | Status: DC | PRN
  Administered 2020-09-17 – 2020-09-21 (×19): 15 mL via ORAL
  Filled 2020-09-17 (×20): qty 15

## 2020-09-17 MED ORDER — UMECLIDINIUM-VILANTEROL 62.5-25 MCG/INH IN AEPB
1.0000 | INHALATION_SPRAY | Freq: Every day | RESPIRATORY_TRACT | Status: DC
  Administered 2020-09-18 – 2020-09-22 (×5): 1 via RESPIRATORY_TRACT
  Filled 2020-09-17: qty 14

## 2020-09-17 MED ORDER — IPRATROPIUM-ALBUTEROL 20-100 MCG/ACT IN AERS
2.0000 | INHALATION_SPRAY | Freq: Four times a day (QID) | RESPIRATORY_TRACT | Status: DC
  Filled 2020-09-17: qty 4

## 2020-09-17 MED ORDER — IPRATROPIUM-ALBUTEROL 20-100 MCG/ACT IN AERS
2.0000 | INHALATION_SPRAY | Freq: Four times a day (QID) | RESPIRATORY_TRACT | Status: DC | PRN
  Administered 2020-09-18 – 2020-09-19 (×2): 2 via RESPIRATORY_TRACT
  Filled 2020-09-17: qty 4

## 2020-09-17 MED ORDER — CLOPIDOGREL BISULFATE 75 MG PO TABS
75.0000 mg | ORAL_TABLET | Freq: Every day | ORAL | Status: DC
  Administered 2020-09-17 – 2020-09-22 (×6): 75 mg via ORAL
  Filled 2020-09-17 (×6): qty 1

## 2020-09-17 NOTE — Progress Notes (Signed)
PROGRESS NOTE        PATIENT DETAILS Name: Suzanne Dickson Age: 75 y.o. Sex: female Date of Birth: [birth year] Admit Date: 09/15/2020 Admitting Physician Renee Pain, MD JQB:[id number], Prudencio Burly, MD  Brief Narrative: Patient is a 75 y.o. female with history of COPD (on 4-7 L of oxygen at home), HTN, CAD, PAH, adenocarcinoma of lung (remote history) who presented to the ED with shortness of breath-she was found to have acute hypoxic respiratory failure requiring BiPAP-in the setting of COPD exacerbation and COVID-19 pneumonia.  She was admitted to the [facility] upon stability-was transferred to the [facility] hospital[facility] service on 8/24.  Significant events: 8/22>> shortness of breath-requiring BiPAP-to ICU- COPD exac/COVID-19 pneumonia 8/24>> transfer to [facility].  Significant studies: 8/22>> CXR: Bibasilar/groundglass opacities. 8/25>> CXR: No pneumothorax-continues to have subtle bibasilar opacities (personally reviewed)  COVID-19 specific treatment:  Remdesivir: 8/22>> Steroids: 8/22>>  Microbiology data: 8/22>> COVID PCR: Positive 8/22>> blood culture: No growth 8/23>> blood culture: No growth  Procedures : None  Consults:  PCCM  DVT Prophylaxis : heparin injection 5,000 Units Start: 09/17/20 0600 SCDs Start: 09/16/20 0035   Subjective: Feels better-on 5 L of oxygen this morning.   Assessment/Plan: Acute on chronic hypoxic respiratory failure due to COPD exacerbation in the setting of COVID-19 pneumonia: Improving-back down to 5 L of oxygen (home regimen of 4-7).  Continue steroids/remdesivir/bronchodilators-CRP downtrending.  Ambulate with rehab services and see how she does.  No signs of volume overload.    Unclear whether patient is on chronic steroids-we will need to verify before we start tapering down steroids.  Recent Labs    09/15/20 2106 09/17/20 0751  DDIMER 1.15* 1.12*  FERRITIN 140  --   LDH 248*  --   CRP 9.0* 4.9*   AKI:  Mild-improving-likely hemodynamically mediated.  Macrocytic anemia: Check folate/B12 with a.m. labs.  Follow for now.  CAD: No anginal symptoms-PCI apparently done in 2019-continue antiplatelet agents/statin/beta-blocker.  HTN: BP stable-continue metoprolol, losartan, amlodipine  Remote history of lung adenocarcinoma-s/p radiation therapy: Continue outpatient surveillance.  Obesity: Estimated body mass index is 32.26 kg/m as calculated from the following:   Height as of this encounter: 5\' 2"  (1.575 m).   Weight as of this encounter: 80 kg.   Diet: Diet Order             Diet heart healthy/carb modified Room service appropriate? Yes; Fluid consistency: Thin  Diet effective now                    Code Status: Full code   Family Communication: Spouse at bedside  Disposition Plan: Status is: Inpatient  Remains inpatient appropriate because:Inpatient level of care appropriate due to severity of illness  Dispo: The patient is from: Home              Anticipated d/c is to: Home              Patient currently is not medically stable to d/c.   Difficult to place patient No   Barriers to Discharge: Hypoxia-COVID-19-needs 5 days of Remdesivir-noted stable for discharge.  Antimicrobial agents: Anti-infectives (From admission, onward)    Start     Dose/Rate Route Frequency Ordered Stop   09/17/20 1000  remdesivir 100 mg in sodium chloride 0.9 % 100 mL IVPB       See Hyperspace for full  Linked Orders Report.   100 mg 200 mL/hr over 30 Minutes Intravenous Daily 09/16/20 0048 09/21/20 0959   09/16/20 0200  remdesivir 200 mg in sodium chloride 0.9% 250 mL IVPB       See Hyperspace for full Linked Orders Report.   200 mg 580 mL/hr over 30 Minutes Intravenous Once 09/16/20 0048 09/16/20 0254        Time spent: 35 minutes-Greater than 50% of this time was spent in counseling, explanation of diagnosis, planning of further management, and coordination of  care.  MEDICATIONS: Scheduled Meds:  amLODipine  5 mg Oral Daily   aspirin EC  81 mg Oral Daily   budesonide  1 puff Inhalation BID   Chlorhexidine Gluconate Cloth  6 each Topical Q0600   cholecalciferol  1,000 Units Oral Daily   ferrous sulfate  325 mg Oral Q1400   heparin  5,000 Units Subcutaneous Q8H   insulin aspart  0-20 Units Subcutaneous TID WC   Ipratropium-Albuterol  1 puff Inhalation Q6H   losartan  50 mg Oral Daily   methylPREDNISolone (SOLU-MEDROL) injection  125 mg Intravenous Q24H   metoprolol succinate  25 mg Oral Daily   pantoprazole  40 mg Oral Daily   sertraline  100 mg Oral QHS   Continuous Infusions:  remdesivir 100 mg in NS 100 mL 100 mg (09/17/20 0901)   PRN Meds:.albuterol, docusate sodium, guaiFENesin-dextromethorphan, polyethylene glycol   PHYSICAL EXAM: Vital signs: Vitals:   09/17/20 0530 09/17/20 0748 09/17/20 1147 09/17/20 1200  BP:  (!) 154/78 (!) 179/68 (!) 139/91  Pulse: 68 80 81 86  Resp: 16 20 15 20   Temp:  98.4 F (36.9 C) 98.5 F (36.9 C)   TempSrc:  Oral Axillary   SpO2: 99% 93% 92% 97%  Weight:      Height:       Filed Weights   09/15/20 2111 09/16/20 0254 09/17/20 0420  Weight: 72.6 kg 74.7 kg 80 kg   Body mass index is 32.26 kg/m.   Gen Exam:Alert awake-not in any distress HEENT:atraumatic, normocephalic Chest: B/L clear to auscultation anteriorly CVS:S1S2 regular Abdomen:soft non tender, non distended Extremities:no edema Neurology: Non focal Skin: no rash  I have personally reviewed following labs and imaging studies  LABORATORY DATA: CBC: Recent Labs  Lab 09/15/20 2106 09/15/20 2133 09/16/20 0429 09/16/20 0512 09/17/20 0751  WBC 9.9  --  2.8*  --  6.5  NEUTROABS 6.6  --   --   --   --   HGB 9.8* 9.9* 9.2* 10.2* 9.5*  HCT 32.3* 29.0* 30.9* 30.0* 31.8*  MCV 102.5*  --  101.6*  --  100.6*  PLT 158  --  105*  --  128    Basic Metabolic Panel: Recent Labs  Lab 09/15/20 2106 09/15/20 2133  09/16/20 0429 09/16/20 0512 09/17/20 0751  NA 139 142 139 141 141  K 3.3* 3.2* 4.5 4.3 4.5  CL 104  --  107  --  105  CO2 26  --  22  --  30  GLUCOSE 252*  --  223*  --  205*  BUN 18  --  19  --  25*  CREATININE 1.25*  --  1.15*  --  1.12*  CALCIUM 8.9  --  8.7*  --  8.9  MG  --   --  2.1  --   --   PHOS  --   --  3.8  --   --     GFR:  Estimated Creatinine Clearance: 43.2 mL/min (A) (by C-G formula based on SCr of 1.12 mg/dL (H)).  Liver Function Tests: Recent Labs  Lab 09/15/20 2106 09/17/20 0751  AST 39 47*  ALT 28 35  ALKPHOS 41 34*  BILITOT 0.5 0.6  PROT 6.6 6.6  ALBUMIN 3.4* 3.3*   No results for input(s): LIPASE, AMYLASE in the last 168 hours. No results for input(s): AMMONIA in the last 168 hours.  Coagulation Profile: No results for input(s): INR, PROTIME in the last 168 hours.  Cardiac Enzymes: No results for input(s): CKTOTAL, CKMB, CKMBINDEX, TROPONINI in the last 168 hours.  BNP (last 3 results) No results for input(s): PROBNP in the last 8760 hours.  Lipid Profile: Recent Labs    09/15/20 2148  TRIG 104    Thyroid Function Tests: No results for input(s): TSH, T4TOTAL, FREET4, T3FREE, THYROIDAB in the last 72 hours.  Anemia Panel: Recent Labs    09/15/20 2106  FERRITIN 140    Urine analysis: No results found for: COLORURINE, APPEARANCEUR, LABSPEC, PHURINE, GLUCOSEU, HGBUR, BILIRUBINUR, KETONESUR, PROTEINUR, UROBILINOGEN, NITRITE, LEUKOCYTESUR  Sepsis Labs: Lactic Acid, Venous    Component Value Date/Time   LATICACIDVEN 1.3 09/15/2020 2106    MICROBIOLOGY: Recent Results (from the past 240 hour(s))  Resp Panel by RT-PCR (Flu A&B, Covid) Nasopharyngeal Swab     Status: Abnormal   Collection Time: 09/15/20  9:04 PM   Specimen: Nasopharyngeal Swab; Nasopharyngeal(NP) swabs in vial transport medium  Result Value Ref Range Status   SARS Coronavirus 2 by RT PCR POSITIVE (A) NEGATIVE Final    Comment: RESULT CALLED TO, READ BACK BY AND  VERIFIED WITH: K SIMMERSON,RN@[id number]  09/15/20 MKELLY (NOTE) SARS-CoV-2 target nucleic acids are DETECTED.  The SARS-CoV-2 RNA is generally detectable in upper respiratory specimens during the acute phase of infection. Positive results are indicative of the presence of the identified virus, but do not rule out bacterial infection or co-infection with other pathogens not detected by the test. Clinical correlation with patient history and other diagnostic information is necessary to determine patient infection status. The expected result is Negative.  Fact Sheet for Patients: EntrepreneurPulse.com.au  Fact Sheet for Healthcare Providers: IncredibleEmployment.be  This test is not yet approved or cleared by the [place] FDA and  has been authorized for detection and/or diagnosis of SARS-CoV-2 by FDA under an Emergency Use Authorization (EUA).  This EUA will remain in effect (meaning this test can  be used) for the duration of  the COVID-19 declaration under Section 564(b)(1) of the Act, 21 U.S.C. section 360bbb-3(b)(1), unless the authorization is terminated or revoked sooner.     Influenza A by PCR NEGATIVE NEGATIVE Final   Influenza B by PCR NEGATIVE NEGATIVE Final    Comment: (NOTE) The Xpert Xpress SARS-CoV-2/FLU/RSV plus assay is intended as an aid in the diagnosis of influenza from Nasopharyngeal swab specimens and should not be used as a sole basis for treatment. Nasal washings and aspirates are unacceptable for Xpert Xpress SARS-CoV-2/FLU/RSV testing.  Fact Sheet for Patients: EntrepreneurPulse.com.au  Fact Sheet for Healthcare Providers: IncredibleEmployment.be  This test is not yet approved or cleared by the [place] FDA and has been authorized for detection and/or diagnosis of SARS-CoV-2 by FDA under an Emergency Use Authorization (EUA). This EUA will remain in effect (meaning this test  can be used) for the duration of the COVID-19 declaration under Section 564(b)(1) of the Act, 21 U.S.C. section 360bbb-3(b)(1), unless the authorization is terminated or revoked.  Performed at [facility]  Hospital Lab, [place] [address] [place], [place] [address]   Blood Culture (routine x 2)     Status: None (Preliminary result)   Collection Time: 09/15/20  9:34 PM   Specimen: BLOOD LEFT HAND  Result Value Ref Range Status   Specimen Description BLOOD LEFT HAND  Final   Special Requests   Final    BOTTLES DRAWN AEROBIC AND ANAEROBIC Blood Culture adequate volume   Culture   Final    NO GROWTH 2 DAYS Performed at [facility], [place] [address] [place], [place] [address]    Report Status PENDING  Incomplete  MRSA Next Gen by PCR, Nasal     Status: None   Collection Time: 09/16/20  2:47 AM   Specimen: Nasal Mucosa; Nasal Swab  Result Value Ref Range Status   MRSA by PCR Next Gen NOT DETECTED NOT DETECTED Final    Comment: (NOTE) The GeneXpert MRSA Assay (FDA approved for NASAL specimens only), is one component of a comprehensive MRSA colonization surveillance program. It is not intended to diagnose MRSA infection nor to guide or monitor treatment for MRSA infections. Test performance is not FDA approved in patients less than 74 years old. Performed at [facility], [place] [address], [place], [place] [address]   Blood Culture (routine x 2)     Status: None (Preliminary result)   Collection Time: 09/16/20  4:29 AM   Specimen: BLOOD  Result Value Ref Range Status   Specimen Description BLOOD RIGHT ANTECUBITAL  Final   Special Requests   Final    BOTTLES DRAWN AEROBIC ONLY Blood Culture results may not be optimal due to an inadequate volume of blood received in culture bottles   Culture   Final    NO GROWTH 1 DAY Performed at [facility], [place] [address] [place], [place] [address]    Report Status PENDING  Incomplete    RADIOLOGY STUDIES/RESULTS: DG Chest Port 1  View  Result Date: 09/17/2020 CLINICAL DATA:  Shortness of breath.  Cough. EXAM: PORTABLE CHEST 1 VIEW COMPARISON:  September 15, 2020. FINDINGS: Stable probable posttreatment or postsurgical changes are seen involving the left hilum. Stable cardiomediastinal silhouette. No pneumothorax or pleural effusion is noted. Right lung is clear. No definite acute abnormality is noted. Bony thorax is unremarkable. IMPRESSION: Stable probable postsurgical or posttreatment changes involving the left hilum. No definite acute abnormality is noted. Aortic Atherosclerosis (ICD10-I70.0). Electronically Signed   By: Marijo Conception M.D.   On: 09/17/2020 08:47   DG Chest Port 1 View  Result Date: 09/15/2020 CLINICAL DATA:  History of lung cancer, COVID-19 EXAM: PORTABLE CHEST 1 VIEW COMPARISON:  12/22/2018, 03/12/2020 FINDINGS: Single frontal view of the chest demonstrates post therapeutic changes at the left hilum, stable. There has been interval development of interstitial and ground-glass opacities at the lung bases, left greater than right, compatible with infection given history of COVID-19. No effusion or pneumothorax. Background emphysema again noted. IMPRESSION: 1. Bibasilar interstitial and ground-glass opacities consistent with atypical pneumonia such as COVID-19. 2. Stable post therapeutic changes at the left hilum, compatible with previous history of lung cancer. Electronically Signed   By: Randa Ngo M.D.   On: 09/15/2020 21:39     LOS: 1 day   Oren Binet, MD  [facility]    To contact the attending provider between 7A-7P or the covering provider during after hours 7P-7A, please log into the web site [email or web address] and access using universal [place]  password for that web site. If you do not have the password, please call the hospital operator.  09/17/2020, 12:22 PM

## 2020-09-17 NOTE — Progress Notes (Signed)
NAME:  Suzanne Dickson, MRN:  [id number], DOB:  December 30, 1945, LOS: 1 ADMISSION DATE:  09/15/2020 CONSULTATION DATE:  09/15/2020 REFERRING MD:  Joya Gaskins - EDP CHIEF COMPLAINT:  Hypoxia, COVID+   History of Present Illness:  75 year old woman with PMHx significant for HTN, CAD, pulmonary HTN, OSA, COPD (on home O2 4-7L) and lung adenocarcinoma who presented to [facility] ED via EMS 8/22 for SOB, decreased O2 sats. Per EMS, patient was satting in 84s on her home O2 on arrival. Treated for AECOPD in the field with Solumedrol, Duo-Neb, and Epi. Patient tested positive for COVID on 8/17 (previously vaccinated x 2 and boosted x 1) but did not receive treatment.  On arrival to ED, patient transitioned to BiPAP (9/5, FiO2 45%) with improvement in O2 sats to 90s, RR to 20s (from 50s) and subjective relief. CXR demonstrated bibasilar interstitial and GGO consistent with atypical PNA. Labs were notable for mild anemia (Hgb 9.3), elevated D-Dimer, K 3.3,  glucose 252, Cr 1.25 (baseline 0.9-1.1).  PCCM consulted for ICU admission and COVID management.  Pertinent Medical History:  Adenocarcinoma of the lung  CAD HTN OSA  Pulmonary HTN  Significant Hospital Events:  8/22 - Contacted EMS at advice of Pulmonary MD for increased SOB, decreased O2 sats. Sats 60% on EMS arrival on home O2 7L. Positive for COVID 8/17. Solumedrol, Epi, Duo-Neb given with some relief. Placed on BiPAP on arrival to ED with clinical improvement. CCM consulted for admission. 8/23 no acute events overnight  Interim History / Subjective:  Coughing is her main complaint. Per RN, she has had some desaturations only with coughing, but quickly recovers her saturations when she calms down from these episodes. She has chronic anxiety that is worse related to SOB with coughing episodes.  Objective:  Blood pressure (!) 154/78, pulse 80, temperature 98.4 F (36.9 C), temperature source Oral, resp. rate 20, height 5\' 2"  (1.575 m), weight 80 kg, SpO2 93  %.       No intake or output data in the 24 hours ending 09/17/20 1135  Filed Weights   09/15/20 2111 09/16/20 0254 09/17/20 0420  Weight: 72.6 kg 74.7 kg 80 kg   Physical Examination: General: chronically ill appearing woman sitting up in bed in NAD HEENT: [place]/AT, eyes anicteric. Neuro: awake, alert, moving all extremities. Mildly tremulous. CV: S1S2, RRR PULM:  Prolonged exhalation but no wheezing, no rhales. Saturating 100% on 6L [place] + NRB after recent coughing episode.  GI: soft, NT Extremities: no cyanosis Skin: no rashes, warm & dry  CXR personally reviewed> increased bibasilar opacification but no silhouetting of hemidiaphragms or retrocardiac space  BNP 206 D-dimer 1.12 Blood cx NGTD  Resolved Hospital Problem List     Assessment & Plan:  Acute on chronic hypoxic respiratory failure in the setting of AECOPD caused by COVID pneumonia OSA -Followed by Dr. Melvyn Novas for management of COPD/OSA -Home oxygen of 4-6L continuously P: -Con't to wean O2 as able to maintain SpO2 >88%. Currently at or near her baseline except during coughing episodes. -con't steroids; can deescalate slowly over the coming days as long as her wheezing remains controlled -con't remdesivir -bronchodilators- con't pulmicort & adding Anoro. She was prescribed trelegy as an outpatient -may consider diuresis given elevated BNP, but on exam she does not appear hypervolemic.  History of HTN -Home medication include; Norvasc, HCTZ, Cozaar, Toprol-XL, History of CAD -Medication reconciliation revealed both ASA and Plavix for cardiac stent placed 2019. Spoke to Dr. Lona Kettle -per primary   History  of lung adenocarcinoma -Remote history of lung adenocarcinoma, s/p radiation therapy. P: - outpatient surveillance   Best Practice:    Per primary     Julian Hy, DO 09/17/20 1:44 PM [facility]  For contact information, see Amion. If no response to pager, please call PCCM  consult pager. After hours, 7PM- 7AM, please call Elink.

## 2020-09-17 NOTE — Progress Notes (Signed)
TR[facility] night shift PCU coverage note.  The nursing staff reported that the patient is having frequent cough, sometimes making her short of breath and not allowing her to sleep.  She was admitted for acute respiratory failure with hypoxia in the setting of COPD exacerbation triggered by COVID-19 disease.  Robitussin-DM 15 mL every 4 hours as needed ordered.  Tennis Must, MD.

## 2020-09-18 DIAGNOSIS — J9621 Acute and chronic respiratory failure with hypoxia: Secondary | ICD-10-CM | POA: Diagnosis not present

## 2020-09-18 DIAGNOSIS — D539 Nutritional anemia, unspecified: Secondary | ICD-10-CM | POA: Diagnosis not present

## 2020-09-18 DIAGNOSIS — J441 Chronic obstructive pulmonary disease with (acute) exacerbation: Secondary | ICD-10-CM | POA: Diagnosis not present

## 2020-09-18 DIAGNOSIS — J9622 Acute and chronic respiratory failure with hypercapnia: Secondary | ICD-10-CM | POA: Diagnosis not present

## 2020-09-18 DIAGNOSIS — U071 COVID-19: Secondary | ICD-10-CM | POA: Diagnosis not present

## 2020-09-18 LAB — COMPREHENSIVE METABOLIC PANEL
ALT: 38 U/L (ref 0–44)
AST: 44 U/L — ABNORMAL HIGH (ref 15–41)
Albumin: 3.1 g/dL — ABNORMAL LOW (ref 3.5–5.0)
Alkaline Phosphatase: 36 U/L — ABNORMAL LOW (ref 38–126)
Anion gap: 5 (ref 5–15)
BUN: 25 mg/dL — ABNORMAL HIGH (ref 8–23)
CO2: 30 mmol/L (ref 22–32)
Calcium: 8.8 mg/dL — ABNORMAL LOW (ref 8.9–10.3)
Chloride: 106 mmol/L (ref 98–111)
Creatinine, Ser: 1.03 mg/dL — ABNORMAL HIGH (ref 0.44–1.00)
GFR, Estimated: 57 mL/min — ABNORMAL LOW (ref >60)
Glucose, Bld: 165 mg/dL — ABNORMAL HIGH (ref 70–99)
Potassium: 4.3 mmol/L (ref 3.5–5.1)
Sodium: 141 mmol/L (ref 135–145)
Total Bilirubin: 0.6 mg/dL (ref 0.3–1.2)
Total Protein: 6.2 g/dL — ABNORMAL LOW (ref 6.5–8.1)

## 2020-09-18 LAB — GLUCOSE, CAPILLARY
Glucose-Capillary: 144 mg/dL — ABNORMAL HIGH (ref 70–99)
Glucose-Capillary: 163 mg/dL — ABNORMAL HIGH (ref 70–99)
Glucose-Capillary: 193 mg/dL — ABNORMAL HIGH (ref 70–99)
Glucose-Capillary: 215 mg/dL — ABNORMAL HIGH (ref 70–99)
Glucose-Capillary: 236 mg/dL — ABNORMAL HIGH (ref 70–99)
Glucose-Capillary: 247 mg/dL — ABNORMAL HIGH (ref 70–99)

## 2020-09-18 LAB — D-DIMER, QUANTITATIVE: D-Dimer, Quant: 0.72 ug/mL-FEU — ABNORMAL HIGH (ref 0.00–0.50)

## 2020-09-18 LAB — CBC
HCT: 31.5 % — ABNORMAL LOW (ref 36.0–46.0)
Hemoglobin: 9.6 g/dL — ABNORMAL LOW (ref 12.0–15.0)
MCH: 30.7 pg (ref 26.0–34.0)
MCHC: 30.5 g/dL (ref 30.0–36.0)
MCV: 100.6 fL — ABNORMAL HIGH (ref 80.0–100.0)
Platelets: 174 10*3/uL (ref 150–400)
RBC: 3.13 MIL/uL — ABNORMAL LOW (ref 3.87–5.11)
RDW: 12 % (ref 11.5–15.5)
WBC: 9 10*3/uL (ref 4.0–10.5)
nRBC: 0 % (ref 0.0–0.2)

## 2020-09-18 LAB — C-REACTIVE PROTEIN: CRP: 2.5 mg/dL — ABNORMAL HIGH (ref <1.0)

## 2020-09-18 LAB — VITAMIN B12: Vitamin B-12: 3802 pg/mL — ABNORMAL HIGH (ref 180–914)

## 2020-09-18 LAB — FOLATE: Folate: 17.9 ng/mL (ref >5.9)

## 2020-09-18 MED ORDER — BENZONATATE 100 MG PO CAPS
200.0000 mg | ORAL_CAPSULE | Freq: Three times a day (TID) | ORAL | Status: DC | PRN
  Administered 2020-09-18 – 2020-09-21 (×7): 200 mg via ORAL
  Filled 2020-09-18 (×7): qty 2

## 2020-09-18 MED ORDER — ONDANSETRON HCL 4 MG/2ML IJ SOLN: 4.0000 mg | Freq: Four times a day (QID) | INTRAMUSCULAR | Status: DC | PRN

## 2020-09-18 MED ORDER — ACETAMINOPHEN 325 MG PO TABS
650.0000 mg | ORAL_TABLET | Freq: Four times a day (QID) | ORAL | Status: DC | PRN
Start: 2020-09-18 — End: 2020-09-22
  Administered 2020-09-18 – 2020-09-21 (×2): 650 mg via ORAL
  Filled 2020-09-18 (×2): qty 2

## 2020-09-18 NOTE — Evaluation (Signed)
Occupational Therapy Evaluation Patient Details Name: Suzanne Dickson MRN: [id number] DOB: 1945-01-31 Today's Date: 09/18/2020    History of Present Illness Pt is a 75 year old woman admitted on 8/23/2 with acute on chronic respiratory failure and COVID 19. Required bipap. PMH: COPD on 4-7L depending on activity, HTN, CAD, remote lung ca, PAH, obesity.   Clinical Impression   Pt lives with her husband and son who has a cognitive disability. She sometimes uses a rollator and is modified independent in self care. She is dependent in IADL. Pt presents with poor activity tolerance with Sp02 dropping with conversation on 4L 02 to mid 80s, to 78% with bed mobility, requiring increase to 6L to recover. Transferred to chair with supervision with Sp02 down to 83%, required 15 via non rebreather to recover to 88-91%, left pt on 5L and encouraged use of IS, RN and MD notified. Will follow acutely.     Follow Up Recommendations  Home health OT    Equipment Recommendations  None recommended by OT    Recommendations for Other Services       Precautions / Restrictions Precautions Precautions: Fall      Mobility Bed Mobility Overal bed mobility: Modified Independent             General bed mobility comments: HOB up    Transfers Overall transfer level: Needs assistance Equipment used: Rolling walker (2 wheeled) Transfers: Sit to/from [facility] Sit to Stand: Supervision Stand pivot transfers: Supervision            Balance Overall balance assessment: Needs assistance   Sitting balance-Leahy Scale: Good       Standing balance-Leahy Scale: Fair                             ADL either performed or assessed with clinical judgement   ADL Overall ADL's : Needs assistance/impaired Eating/Feeding: Independent;Bed level   Grooming: Wash/dry hands;Wash/dry face;Sitting;Set up   Upper Body Bathing: Minimal assistance;Sitting   Lower Body Bathing:  Minimal assistance;Sit to/from stand   Upper Body Dressing : Set up;Sitting   Lower Body Dressing: Minimal assistance;Sit to/from stand   Toilet Transfer: Stand-pivot;RW;Supervision/safety                   Vision Baseline Vision/History: 1 Wears glasses;4 Cataracts Patient Visual Report: No change from baseline       Perception     Praxis      Pertinent Vitals/Pain Pain Assessment: No/denies pain     Hand Dominance Right   Extremity/Trunk Assessment Upper Extremity Assessment Upper Extremity Assessment: Overall WFL for tasks assessed (tremulous)   Lower Extremity Assessment Lower Extremity Assessment: Defer to PT evaluation   Cervical / Trunk Assessment Cervical / Trunk Assessment: Normal   Communication Communication Communication: No difficulties   Cognition Arousal/Alertness: Awake/alert Behavior During Therapy: Anxious Overall Cognitive Status: Within Functional Limits for tasks assessed                                     General Comments       Exercises     Shoulder Instructions      Home Living Family/patient expects to be discharged to:: Private residence Living Arrangements: Spouse/significant other;Children Available Help at Discharge: Family;Available 24 hours/day Type of Home: House Home Access: Stairs to enter [employer] of Steps: 3 Entrance  Stairs-Rails: Right;Left Home Layout: Two level;Able to live on main level with bedroom/bathroom     Bathroom Shower/Tub: Walk-in shower;Tub/shower unit         Home Equipment: Walker - 4 wheels;Other (comment);Shower seat (02)          Prior Functioning/Environment Level of Independence: Needs assistance  Gait / Transfers Assistance Needed: walks sometimes with rollator, sometimes without depending on how she feels ADL's / Homemaking Assistance Needed: mod I in self care, dependent in housekeeping and meal prep            OT Problem List: Impaired  balance (sitting and/or standing);Decreased activity tolerance;Decreased knowledge of use of DME or AE;Cardiopulmonary status limiting activity      OT Treatment/Interventions: Self-care/ADL training;Energy conservation;Therapeutic activities;Patient/family education;Balance training    OT Goals(Current goals can be found in the care plan section) Acute Rehab OT Goals Patient Stated Goal: to breathe better OT Goal Formulation: With patient Time For Goal Achievement: 10/02/20 Potential to Achieve Goals: Good ADL Goals Pt Will Perform Grooming: with supervision;standing (2 activities) Pt Will Perform Upper Body Bathing: with set-up;sitting (at sink) Pt Will Perform Lower Body Bathing: with supervision;sit to/from stand (sitting at sink) Pt Will Perform Upper Body Dressing: with set-up;sitting Pt Will Perform Lower Body Dressing: with supervision;sit to/from stand Pt Will Transfer to Toilet: with supervision;ambulating Pt Will Perform Toileting - Clothing Manipulation and hygiene: with supervision;sit to/from stand Additional ADL Goal #1: Pt will tolerate ADL, maintaining Sp02 >88% with intermittent rest breaks on <7L 02  OT Frequency: Min 2X/week   Barriers to D/C:            Co-evaluation              AM-PAC OT "6 Clicks" Daily Activity     Outcome Measure Help from another person eating meals?: None Help from another person taking care of personal grooming?: A Little Help from another person toileting, which includes using toliet, bedpan, or urinal?: A Little Help from another person bathing (including washing, rinsing, drying)?: A Little Help from another person to put on and taking off regular upper body clothing?: A Little Help from another person to put on and taking off regular lower body clothing?: A Little 6 Click Score: 19   End of Session Equipment Utilized During Treatment: Oxygen;Rolling walker Nurse Communication: Other (comment) (02 needs)  Activity  Tolerance: Treatment limited secondary to medical complications (Comment) (desats with minimal activity) Patient left: in chair;with call bell/phone within reach  OT Visit Diagnosis: Unsteadiness on feet (R26.81);Other (comment) (decreased activity tolerance)                Time: 6629-4765 OT Time Calculation (min): 48 min Charges:  OT General Charges $OT Visit: 1 Visit OT Evaluation $OT Eval Moderate Complexity: 1 Mod OT Treatments $Self Care/Home Management : 23-37 mins  Nestor Lewandowsky, OTR/L Acute Rehabilitation Services Pager: [phone] Office: [phone]   Malka So 09/18/2020, 10:36 AM

## 2020-09-18 NOTE — Evaluation (Signed)
Physical Therapy Evaluation Patient Details Name: Suzanne Dickson MRN: [id number] DOB: 11-10-1945 Today's Date: 09/18/2020   History of Present Illness  Pt is a 75 year old woman admitted on 8/23/2 with acute on chronic respiratory failure and COVID 19. Required bipap. PMH: COPD on 4-7L depending on activity, HTN, CAD, remote lung ca, PAH, obesity.  Clinical Impression  Pt is up in chair when PT arrives, and is notably SOB even at rest.  Did very small increments of movement with PT, including shifting to front of chair, then standing then stepping.  Pt required the non rebreather to stabilize after a few steps, but did not get below 90%.  Follow for acute PT goals, recommending her to rehab unless she can get control of endurance standing and walking to go home.    Follow Up Recommendations SNF (downgrade to HHPT if pt can walk)    Equipment Recommendations  Rolling walker with 5" wheels    Recommendations for Other Services       Precautions / Restrictions Precautions Precautions: Fall Restrictions Weight Bearing Restrictions: No      Mobility  Bed Mobility               General bed mobility comments: up in chair when PT arrived    Transfers Overall transfer level: Needs assistance Equipment used: Rolling walker (2 wheeled) Transfers: Sit to/from Stand Sit to Stand: Min guard         General transfer comment: cued hand placement  Ambulation/Gait Ambulation/Gait assistance: Min guard Gait Distance (Feet): 3 Feet Assistive device: Rolling walker (2 wheeled);1 person hand held assist Gait Pattern/deviations: Step-through pattern;Step-to pattern;Decreased stride length;Trunk flexed;Wide base of support Gait velocity: reduced Gait velocity interpretation: <1.31 ft/sec, indicative of household ambulator General Gait Details: pt was able to stand and step but SOB and exerted quickly.  O2 sats were in 90% range the entire time  Stairs            Wheelchair  Mobility    Modified Rankin (Stroke Patients Only)       Balance Overall balance assessment: Needs assistance Sitting-balance support: Feet supported Sitting balance-Leahy Scale: Good     Standing balance support: Bilateral upper extremity supported;During functional activity Standing balance-Leahy Scale: Fair                               Pertinent Vitals/Pain Pain Assessment: No/denies pain    Home Living Family/patient expects to be discharged to:: Private residence Living Arrangements: Spouse/significant other;Children Available Help at Discharge: Family;Available 24 hours/day Type of Home: House Home Access: Stairs to enter Entrance Stairs-Rails: Psychiatric nurse of Steps: 3 Home Layout: Two level;Able to live on main level with bedroom/bathroom Home Equipment: Walker - 4 wheels;Other (comment);Shower seat (shower chair)      Prior Function Level of Independence: Needs assistance   Gait / Transfers Assistance Needed: RW or no AD  ADL's / Homemaking Assistance Needed: I to do dressing and bathing but help with house        Hand Dominance   Dominant Hand: Right    Extremity/Trunk Assessment   Upper Extremity Assessment Upper Extremity Assessment: Defer to OT evaluation    Lower Extremity Assessment Lower Extremity Assessment: Generalized weakness    Cervical / Trunk Assessment Cervical / Trunk Assessment: Kyphotic (mild)  Communication   Communication: No difficulties  Cognition Arousal/Alertness: Awake/alert Behavior During Therapy: Flat affect Overall Cognitive Status: Within Functional  Limits for tasks assessed                                 General Comments: tired and SOB from AM session      General Comments General comments (skin integrity, edema, etc.): pt was assisted to stand and take a few steps, O2 sats were variable but not below 90%.  Used her mask to supplement standing and takign a  couple steps    Exercises     Assessment/Plan    PT Assessment Patient needs continued PT services  PT Problem List Decreased strength;Decreased balance;Decreased mobility;Decreased activity tolerance;Decreased knowledge of use of DME;Decreased safety awareness;Cardiopulmonary status limiting activity       PT Treatment Interventions DME instruction;Gait training;Stair training;Functional mobility training;Therapeutic activities;Therapeutic exercise;Balance training;Neuromuscular re-education;Patient/family education    PT Goals (Current goals can be found in the Care Plan section)  Acute Rehab PT Goals Patient Stated Goal: not to be SOB PT Goal Formulation: With patient Time For Goal Achievement: 10/02/20 Potential to Achieve Goals: Good    Frequency Min 2X/week   Barriers to discharge Inaccessible home environment;Decreased caregiver support home with family with stairs to enter    Co-evaluation               AM-PAC PT "6 Clicks" Mobility  Outcome Measure Help needed turning from your back to your side while in a flat bed without using bedrails?: A Little Help needed moving from lying on your back to sitting on the side of a flat bed without using bedrails?: A Little Help needed moving to and from a bed to a chair (including a wheelchair)?: A Little Help needed standing up from a chair using your arms (e.g., wheelchair or bedside chair)?: A Little Help needed to walk in hospital room?: A Little Help needed climbing 3-5 steps with a railing? : A Lot 6 Click Score: 17    End of Session Equipment Utilized During Treatment: Gait belt;Oxygen Activity Tolerance: Patient limited by fatigue;Treatment limited secondary to medical complications (Comment) (SOB and fatigued) Patient left: in chair;with call bell/phone within reach;with chair alarm set Nurse Communication: Mobility status PT Visit Diagnosis: Unsteadiness on feet (R26.81);Muscle weakness (generalized)  (M62.81);Dizziness and giddiness (R42);Adult, failure to thrive (R62.7);Difficulty in walking, not elsewhere classified (R26.2)    Time: 0383-3383 PT Time Calculation (min) (ACUTE ONLY): 27 min   Charges:   PT Evaluation $PT Eval Moderate Complexity: 1 Mod PT Treatments $Gait Training: 8-22 mins       Suzanne Dickson 09/18/2020, 3:28 PM  Suzanne Dickson, PT MS Acute Rehab Dept. Number: [place]

## 2020-09-18 NOTE — Progress Notes (Signed)
PROGRESS NOTE        PATIENT DETAILS Name: Suzanne Dickson Age: 75 y.o. Sex: female Date of Birth: [birth year] Admit Date: 09/15/2020 Admitting Physician Renee Pain, MD TXM:IWOEHOZ, Prudencio Burly, MD  Brief Narrative: Patient is a 75 y.o. female with history of COPD (on 4-7 L of oxygen at home), HTN, CAD, PAH, adenocarcinoma of lung (remote history) who presented to the ED with shortness of breath-she was found to have acute hypoxic respiratory failure requiring BiPAP-in the setting of COPD exacerbation and COVID-19 pneumonia.  She was admitted to the [facility] upon stability-was transferred to the [facility] on 8/24.  Significant events: 8/22>> shortness of breath-requiring BiPAP-to ICU- COPD exac/COVID-19 pneumonia 8/24>> transfer to [facility].  Significant studies: 8/22>> CXR: Bibasilar/groundglass opacities. 8/25>> CXR: No pneumothorax-continues to have subtle bibasilar opacities (personally reviewed)  COVID-19 specific treatment:  Remdesivir: 8/22>> Steroids: 8/22>>  Microbiology data: 8/22>> COVID PCR: Positive 8/22>> blood culture: No growth 8/23>> blood culture: No growth  Procedures : None  Consults:  PCCM  DVT Prophylaxis : heparin injection 5,000 Units Start: 09/17/20 0600 SCDs Start: 09/16/20 0035   Subjective: Continues to slowly improve-was on 7 L of oxygen this morning-I turned her down to 4 L-O2 saturations were stable in the 90s.  Spoke with RN-explained goal O2 saturation is around 90%.   Assessment/Plan: Acute on chronic hypoxic respiratory failure due to COPD exacerbation in the setting of COVID-19 pneumonia: Slowly improving-at rest around 4-7 L of oxygen-but when ambulated with PT-requiring NRB.  Minimal wheezing today.  No signs of volume overload.  Continue to slowly taper down steroids-remains on IV Remdesivir-continue bronchodilators.  Continue to mobilize as much as possible.  Note-patient on chronic steroids at  home-we will need to be tapered back down to her usual regimen.  Recent Labs    09/15/20 2106 09/17/20 0751 09/18/20 0111  DDIMER 1.15* 1.12* 0.72*  FERRITIN 140  --   --   LDH 248*  --   --   CRP 9.0* 4.9* 2.5*    AKI: Improved-AKI was likely hemodynamically mediated.  Macrocytic anemia: Continue to monitor hemoglobin-B12/folate levels are stable.    CAD: No anginal symptoms-PCI apparently done in 2019-continue antiplatelet agents/statin/beta-blocker.  HTN: BP stable-continue metoprolol/losartan and amlodipine.    Remote history of lung adenocarcinoma-s/p radiation therapy: Continue outpatient surveillance.  Obesity: Estimated body mass index is 32.26 kg/m as calculated from the following:   Height as of this encounter: 5\' 2"  (1.575 m).   Weight as of this encounter: 80 kg.   Diet: Diet Order             Diet heart healthy/carb modified Room service appropriate? Yes; Fluid consistency: Thin  Diet effective now                    Code Status: Full code   Family Communication: Spouse-Ronnie Mccosh [phone] updated over the phone on 8/25  Disposition Plan: Status is: Inpatient  Remains inpatient appropriate because:Inpatient level of care appropriate due to severity of illness  Dispo: The patient is from: Home              Anticipated d/c is to: Home              Patient currently is not medically stable to d/c.   Difficult to place patient No   Barriers to  Discharge: Hypoxia-COVID-19-needs 5 days of Remdesivir-noted stable for discharge.  Still with significant exertional dyspnea requiring NRB.  Antimicrobial agents: Anti-infectives (From admission, onward)    Start     Dose/Rate Route Frequency Ordered Stop   09/17/20 1000  remdesivir 100 mg in sodium chloride 0.9 % 100 mL IVPB       See Hyperspace for full Linked Orders Report.   100 mg 200 mL/hr over 30 Minutes Intravenous Daily 09/16/20 0048 09/21/20 0959   09/16/20 0200  remdesivir 200 mg  in sodium chloride 0.9% 250 mL IVPB       See Hyperspace for full Linked Orders Report.   200 mg 580 mL/hr over 30 Minutes Intravenous Once 09/16/20 0048 09/16/20 0254        Time spent: 35 minutes-Greater than 50% of this time was spent in counseling, explanation of diagnosis, planning of further management, and coordination of care.  MEDICATIONS: Scheduled Meds:  amLODipine  5 mg Oral Daily   aspirin EC  81 mg Oral Daily   budesonide  1 puff Inhalation BID   Chlorhexidine Gluconate Cloth  6 each Topical Q0600   cholecalciferol  1,000 Units Oral Daily   clopidogrel  75 mg Oral Daily   ferrous sulfate  325 mg Oral Q1400   heparin  5,000 Units Subcutaneous Q8H   insulin aspart  0-20 Units Subcutaneous TID WC   losartan  50 mg Oral Daily   methylPREDNISolone (SOLU-MEDROL) injection  125 mg Intravenous Q24H   metoprolol succinate  25 mg Oral Daily   pantoprazole  40 mg Oral Daily   sertraline  100 mg Oral QHS   umeclidinium-vilanterol  1 puff Inhalation Daily   Continuous Infusions:  remdesivir 100 mg in NS 100 mL 100 mg (09/18/20 0944)   PRN Meds:.albuterol, benzonatate, docusate sodium, guaiFENesin-dextromethorphan, Ipratropium-Albuterol, polyethylene glycol   PHYSICAL EXAM: Vital signs: Vitals:   09/18/20 0000 09/18/20 0400 09/18/20 0737 09/18/20 1138  BP: (!) 148/74 (!) 143/68 (!) 149/75 (!) 150/66  Pulse: 74 60 60 71  Resp: 20 18 16  (!) 21  Temp: 98.1 F (36.7 C) 98.4 F (36.9 C) 98 F (36.7 C) 98.3 F (36.8 C)  TempSrc: Oral Axillary Oral Oral  SpO2: 100% 99% 100% 98%  Weight:      Height:       Filed Weights   09/15/20 2111 09/16/20 0254 09/17/20 0420  Weight: 72.6 kg 74.7 kg 80 kg   Body mass index is 32.26 kg/m.   Gen Exam:Alert awake-not in any distress HEENT:atraumatic, normocephalic Chest: Scattered rhonchi-but good air entry. CVS:S1S2 regular Abdomen:soft non tender, non distended Extremities:no edema Neurology: Non focal Skin: no rash    I have personally reviewed following labs and imaging studies  LABORATORY DATA: CBC: Recent Labs  Lab 09/15/20 2106 09/15/20 2133 09/16/20 0429 09/16/20 0512 09/17/20 0751 09/18/20 0111  WBC 9.9  --  2.8*  --  6.5 9.0  NEUTROABS 6.6  --   --   --   --   --   HGB 9.8* 9.9* 9.2* 10.2* 9.5* 9.6*  HCT 32.3* 29.0* 30.9* 30.0* 31.8* 31.5*  MCV 102.5*  --  101.6*  --  100.6* 100.6*  PLT 158  --  105*  --  150 174     Basic Metabolic Panel: Recent Labs  Lab 09/15/20 2106 09/15/20 2133 09/16/20 0429 09/16/20 0512 09/17/20 0751 09/18/20 0111  NA 139 142 139 141 141 141  K 3.3* 3.2* 4.5 4.3 4.5 4.3  CL 104  --  107  --  105 106  CO2 26  --  22  --  30 30  GLUCOSE 252*  --  223*  --  205* 165*  BUN 18  --  19  --  25* 25*  CREATININE 1.25*  --  1.15*  --  1.12* 1.03*  CALCIUM 8.9  --  8.7*  --  8.9 8.8*  MG  --   --  2.1  --   --   --   PHOS  --   --  3.8  --   --   --      GFR: Estimated Creatinine Clearance: 47 mL/min (A) (by C-G formula based on SCr of 1.03 mg/dL (H)).  Liver Function Tests: Recent Labs  Lab 09/15/20 2106 09/17/20 0751 09/18/20 0111  AST 39 47* 44*  ALT 28 35 38  ALKPHOS 41 34* 36*  BILITOT 0.5 0.6 0.6  PROT 6.6 6.6 6.2*  ALBUMIN 3.4* 3.3* 3.1*    No results for input(s): LIPASE, AMYLASE in the last 168 hours. No results for input(s): AMMONIA in the last 168 hours.  Coagulation Profile: No results for input(s): INR, PROTIME in the last 168 hours.  Cardiac Enzymes: No results for input(s): CKTOTAL, CKMB, CKMBINDEX, TROPONINI in the last 168 hours.  BNP (last 3 results) No results for input(s): PROBNP in the last 8760 hours.  Lipid Profile: Recent Labs    09/15/20 2148  TRIG 104     Thyroid Function Tests: No results for input(s): TSH, T4TOTAL, FREET4, T3FREE, THYROIDAB in the last 72 hours.  Anemia Panel: Recent Labs    09/15/20 2106 09/18/20 0111  VITAMINB12  --  3,802*  FOLATE  --  17.9  FERRITIN 140  --       Urine analysis: No results found for: COLORURINE, APPEARANCEUR, LABSPEC, PHURINE, GLUCOSEU, HGBUR, BILIRUBINUR, KETONESUR, PROTEINUR, UROBILINOGEN, NITRITE, LEUKOCYTESUR  Sepsis Labs: Lactic Acid, Venous    Component Value Date/Time   LATICACIDVEN 1.3 09/15/2020 2106    MICROBIOLOGY: Recent Results (from the past 240 hour(s))  Resp Panel by RT-PCR (Flu A&B, Covid) Nasopharyngeal Swab     Status: Abnormal   Collection Time: 09/15/20  9:04 PM   Specimen: Nasopharyngeal Swab; Nasopharyngeal(NP) swabs in vial transport medium  Result Value Ref Range Status   SARS Coronavirus 2 by RT PCR POSITIVE (A) NEGATIVE Final    Comment: RESULT CALLED TO, READ BACK BY AND VERIFIED WITH: K SIMMERSON,RN@[id number]  09/15/20 MKELLY (NOTE) SARS-CoV-2 target nucleic acids are DETECTED.  The SARS-CoV-2 RNA is generally detectable in upper respiratory specimens during the acute phase of infection. Positive results are indicative of the presence of the identified virus, but do not rule out bacterial infection or co-infection with other pathogens not detected by the test. Clinical correlation with patient history and other diagnostic information is necessary to determine patient infection status. The expected result is Negative.  Fact Sheet for Patients: EntrepreneurPulse.com.au  Fact Sheet for Healthcare Providers: IncredibleEmployment.be  This test is not yet approved or cleared by the [place] FDA and  has been authorized for detection and/or diagnosis of SARS-CoV-2 by FDA under an Emergency Use Authorization (EUA).  This EUA will remain in effect (meaning this test can  be used) for the duration of  the COVID-19 declaration under Section 564(b)(1) of the Act, 21 U.S.C. section 360bbb-3(b)(1), unless the authorization is terminated or revoked sooner.     Influenza A by PCR NEGATIVE NEGATIVE Final   Influenza B by PCR NEGATIVE NEGATIVE  Final     Comment: (NOTE) The Xpert Xpress SARS-CoV-2/FLU/RSV plus assay is intended as an aid in the diagnosis of influenza from Nasopharyngeal swab specimens and should not be used as a sole basis for treatment. Nasal washings and aspirates are unacceptable for Xpert Xpress SARS-CoV-2/FLU/RSV testing.  Fact Sheet for Patients: EntrepreneurPulse.com.au  Fact Sheet for Healthcare Providers: IncredibleEmployment.be  This test is not yet approved or cleared by the [place] FDA and has been authorized for detection and/or diagnosis of SARS-CoV-2 by FDA under an Emergency Use Authorization (EUA). This EUA will remain in effect (meaning this test can be used) for the duration of the COVID-19 declaration under Section 564(b)(1) of the Act, 21 U.S.C. section 360bbb-3(b)(1), unless the authorization is terminated or revoked.  Performed at [facility], [place] [address], [place], [place] [address]   Blood Culture (routine x 2)     Status: None (Preliminary result)   Collection Time: 09/15/20  9:34 PM   Specimen: BLOOD LEFT HAND  Result Value Ref Range Status   Specimen Description BLOOD LEFT HAND  Final   Special Requests   Final    BOTTLES DRAWN AEROBIC AND ANAEROBIC Blood Culture adequate volume   Culture   Final    NO GROWTH 3 DAYS Performed at [facility], [place] [address], [place], [place] [address]    Report Status PENDING  Incomplete  MRSA Next Gen by PCR, Nasal     Status: None   Collection Time: 09/16/20  2:47 AM   Specimen: Nasal Mucosa; Nasal Swab  Result Value Ref Range Status   MRSA by PCR Next Gen NOT DETECTED NOT DETECTED Final    Comment: (NOTE) The GeneXpert MRSA Assay (FDA approved for NASAL specimens only), is one component of a comprehensive MRSA colonization surveillance program. It is not intended to diagnose MRSA infection nor to guide or monitor treatment for MRSA infections. Test performance is not FDA approved in  patients less than 41 years old. Performed at [facility], [place] [address], [place], [place] [address]   Blood Culture (routine x 2)     Status: None (Preliminary result)   Collection Time: 09/16/20  4:29 AM   Specimen: BLOOD  Result Value Ref Range Status   Specimen Description BLOOD RIGHT ANTECUBITAL  Final   Special Requests   Final    BOTTLES DRAWN AEROBIC ONLY Blood Culture results may not be optimal due to an inadequate volume of blood received in culture bottles   Culture   Final    NO GROWTH 2 DAYS Performed at [facility], [place] [address] [place], [place] [address]    Report Status PENDING  Incomplete    RADIOLOGY STUDIES/RESULTS: DG Chest Port 1 View  Result Date: 09/17/2020 CLINICAL DATA:  Shortness of breath.  Cough. EXAM: PORTABLE CHEST 1 VIEW COMPARISON:  September 15, 2020. FINDINGS: Stable probable posttreatment or postsurgical changes are seen involving the left hilum. Stable cardiomediastinal silhouette. No pneumothorax or pleural effusion is noted. Right lung is clear. No definite acute abnormality is noted. Bony thorax is unremarkable. IMPRESSION: Stable probable postsurgical or posttreatment changes involving the left hilum. No definite acute abnormality is noted. Aortic Atherosclerosis (ICD10-I70.0). Electronically Signed   By: Marijo Conception M.D.   On: 09/17/2020 08:47     LOS: 2 days   Oren Binet, MD  [facility]    To contact the attending provider between 7A-7P or the covering provider during after hours  7P-7A, please log into the web site [email or web address] and access using universal [place] password for that web site. If you do not have the password, please call the hospital operator.  09/18/2020, 12:07 PM

## 2020-09-18 NOTE — Progress Notes (Signed)
NAME:  Suzanne Dickson, MRN:  [id number], DOB:  [birth year], LOS: 2 ADMISSION DATE:  09/15/2020 CONSULTATION DATE:  09/15/2020 REFERRING MD:  Joya Gaskins - EDP CHIEF COMPLAINT:  Hypoxia, COVID+   History of Present Illness:  75 year old woman with PMHx significant for HTN, CAD, pulmonary HTN, OSA, COPD (on home O2 4-7L) and lung adenocarcinoma who presented to [facility] ED via EMS 8/22 for SOB, decreased O2 sats. Per EMS, patient was satting in 85s on her home O2 on arrival. Treated for AECOPD in the field with Solumedrol, Duo-Neb, and Epi. Patient tested positive for COVID on 8/17 (previously vaccinated x 2 and boosted x 1) but did not receive treatment.  On arrival to ED, patient transitioned to BiPAP (9/5, FiO2 45%) with improvement in O2 sats to 90s, RR to 20s (from 50s) and subjective relief. CXR demonstrated bibasilar interstitial and GGO consistent with atypical PNA. Labs were notable for mild anemia (Hgb 9.3), elevated D-Dimer, K 3.3,  glucose 252, Cr 1.25 (baseline 0.9-1.1).  PCCM consulted for ICU admission and COVID management.  Pertinent Medical History:  Adenocarcinoma of the lung  CAD HTN OSA  Pulmonary HTN  Significant Hospital Events:  8/22 - Contacted EMS at advice of Pulmonary MD for increased SOB, decreased O2 sats. Sats 60% on EMS arrival on home O2 7L. Positive for COVID 8/17. Solumedrol, Epi, Duo-Neb given with some relief. Placed on BiPAP on arrival to ED with clinical improvement. CCM consulted for admission. 8/23 no acute events overnight  Interim History / Subjective:  On 7-8L [place] but has maintained saturations in high 90s with increased flow. This morning she was turned down to 4L. She is not sure how far off her baseline she is since she has not yet been OOB and moving around. She is on chronic prednisone at home, and her dose was increased last month.  Objective:  Blood pressure (!) 149/75, pulse 60, temperature 98 F (36.7 C), temperature source Oral, resp. rate 16,  height 5\' 2"  (1.575 m), weight 80 kg, SpO2 100 %.        Intake/Output Summary (Last 24 hours) at 09/18/2020 0915 Last data filed at 09/18/2020 0511 Gross per 24 hour  Intake --  Output 1000 ml  Net -1000 ml    Filed Weights   09/15/20 2111 09/16/20 0254 09/17/20 0420  Weight: 72.6 kg 74.7 kg 80 kg   Physical Examination: General: chronically ill appearing woman sitting up in bed in NAD HEENT: [place]/AT, eyes anicteric Neuro: awake and alert, moving all extremities. Answering questions appropriately. CV: S1S2, RRR, no murmurs PULM:  no wheezing, prolonged exhalation. On 4L [place] desaturated to mid-80s when talking for a prolonged period. Occasional coughing in short spells.  GI: soft, NT, ND Extremities: no cyanosis Skin: warm, dry, no rashes    D-dimer 0.72 CRP 2.5 Blood cx NGTD  Resolved Hospital Problem List     Assessment & Plan:  Acute on chronic hypoxic respiratory failure in the setting of AECOPD caused by COVID pneumonia OSA -Followed by Dr. Melvyn Novas for management of COPD, OSA -Home oxygen of 4-6L continuously P: -Con't to wean O2 as able to maintain SpO2 >88%. Currently at or near her baseline when at rest. Working with therapy today, which will help clarify her oxygen requirements with mobility. -Con't steroids; can deescalate slowly over the coming days as long as her wheezing remains controlled. Eventually will be decreased back to her home steroid dose. -Con't 5-day course of remdesivir -Bronchodilators- Con't pulmicort BID & daily  Anoro. She was prescribed Trelegy as an outpatient, which she will eventually resume at discharge.    History of HTN -Home medication include; Norvasc, HCTZ, Cozaar, Toprol-XL, History of CAD -Medication reconciliation revealed both ASA and Plavix for cardiac stent placed 2019. Spoke to Dr. Lona Kettle -per primary   History of lung adenocarcinoma -Remote history of lung adenocarcinoma, s/p radiation therapy. P: - outpatient surveillance     PCCM will follow up next week. Please call in the interim if there are questions in the interim.  Best Practice:    Per primary     Julian Hy, DO 09/18/20 10:08 AM [facility]  For contact information, see Amion. If no response to pager, please call PCCM consult pager. After hours, 7PM- 7AM, please call Elink.

## 2020-09-19 DIAGNOSIS — U071 COVID-19: Secondary | ICD-10-CM | POA: Diagnosis not present

## 2020-09-19 DIAGNOSIS — J441 Chronic obstructive pulmonary disease with (acute) exacerbation: Secondary | ICD-10-CM | POA: Diagnosis not present

## 2020-09-19 DIAGNOSIS — D539 Nutritional anemia, unspecified: Secondary | ICD-10-CM | POA: Diagnosis not present

## 2020-09-19 DIAGNOSIS — J9601 Acute respiratory failure with hypoxia: Secondary | ICD-10-CM

## 2020-09-19 DIAGNOSIS — J9621 Acute and chronic respiratory failure with hypoxia: Secondary | ICD-10-CM | POA: Diagnosis not present

## 2020-09-19 LAB — GLUCOSE, CAPILLARY
Glucose-Capillary: 139 mg/dL — ABNORMAL HIGH (ref 70–99)
Glucose-Capillary: 182 mg/dL — ABNORMAL HIGH (ref 70–99)
Glucose-Capillary: 187 mg/dL — ABNORMAL HIGH (ref 70–99)
Glucose-Capillary: 194 mg/dL — ABNORMAL HIGH (ref 70–99)
Glucose-Capillary: 195 mg/dL — ABNORMAL HIGH (ref 70–99)
Glucose-Capillary: 256 mg/dL — ABNORMAL HIGH (ref 70–99)
Glucose-Capillary: 361 mg/dL — ABNORMAL HIGH (ref 70–99)

## 2020-09-19 LAB — COMPREHENSIVE METABOLIC PANEL
ALT: 50 U/L — ABNORMAL HIGH (ref 0–44)
AST: 43 U/L — ABNORMAL HIGH (ref 15–41)
Albumin: 3.3 g/dL — ABNORMAL LOW (ref 3.5–5.0)
Alkaline Phosphatase: 44 U/L (ref 38–126)
Anion gap: 8 (ref 5–15)
BUN: 31 mg/dL — ABNORMAL HIGH (ref 8–23)
CO2: 28 mmol/L (ref 22–32)
Calcium: 9.2 mg/dL (ref 8.9–10.3)
Chloride: 105 mmol/L (ref 98–111)
Creatinine, Ser: 1.15 mg/dL — ABNORMAL HIGH (ref 0.44–1.00)
GFR, Estimated: 50 mL/min — ABNORMAL LOW (ref >60)
Glucose, Bld: 258 mg/dL — ABNORMAL HIGH (ref 70–99)
Potassium: 4.5 mmol/L (ref 3.5–5.1)
Sodium: 141 mmol/L (ref 135–145)
Total Bilirubin: 0.8 mg/dL (ref 0.3–1.2)
Total Protein: 6.4 g/dL — ABNORMAL LOW (ref 6.5–8.1)

## 2020-09-19 LAB — CBC
HCT: 33.2 % — ABNORMAL LOW (ref 36.0–46.0)
Hemoglobin: 10 g/dL — ABNORMAL LOW (ref 12.0–15.0)
MCH: 30.4 pg (ref 26.0–34.0)
MCHC: 30.1 g/dL (ref 30.0–36.0)
MCV: 100.9 fL — ABNORMAL HIGH (ref 80.0–100.0)
Platelets: 182 10*3/uL (ref 150–400)
RBC: 3.29 MIL/uL — ABNORMAL LOW (ref 3.87–5.11)
RDW: 11.9 % (ref 11.5–15.5)
WBC: 8.1 10*3/uL (ref 4.0–10.5)
nRBC: 0 % (ref 0.0–0.2)

## 2020-09-19 LAB — BRAIN NATRIURETIC PEPTIDE: B Natriuretic Peptide: 109 pg/mL — ABNORMAL HIGH (ref 0.0–100.0)

## 2020-09-19 LAB — C-REACTIVE PROTEIN: CRP: 1.1 mg/dL — ABNORMAL HIGH (ref <1.0)

## 2020-09-19 LAB — D-DIMER, QUANTITATIVE: D-Dimer, Quant: 0.66 ug/mL-FEU — ABNORMAL HIGH (ref 0.00–0.50)

## 2020-09-19 MED ORDER — FUROSEMIDE 10 MG/ML IJ SOLN
40.0000 mg | Freq: Once | INTRAMUSCULAR | Status: AC
  Administered 2020-09-19: 40 mg via INTRAVENOUS
  Filled 2020-09-19: qty 4

## 2020-09-19 MED ORDER — INSULIN ASPART 100 UNIT/ML IJ SOLN
8.0000 [IU] | Freq: Once | INTRAMUSCULAR | Status: AC
  Administered 2020-09-19: 8 [IU] via SUBCUTANEOUS

## 2020-09-19 MED ORDER — METHYLPREDNISOLONE SODIUM SUCC 40 MG IJ SOLR
40.0000 mg | Freq: Two times a day (BID) | INTRAMUSCULAR | Status: DC
  Administered 2020-09-19 – 2020-09-22 (×6): 40 mg via INTRAVENOUS
  Filled 2020-09-19 (×6): qty 1

## 2020-09-19 NOTE — Progress Notes (Signed)
PROGRESS NOTE        PATIENT DETAILS Name: Suzanne Dickson Age: 75 y.o. Sex: female Date of Birth: Jan 20, 1946 Admit Date: 09/15/2020 Admitting Physician Renee Pain, MD XQJ:JHERDEY, Prudencio Burly, MD  Brief Narrative: Patient is a 75 y.o. female with history of COPD (on 4-7 L of oxygen at home), HTN, CAD, PAH, adenocarcinoma of lung (remote history) who presented to the ED with shortness of breath-she was found to have acute hypoxic respiratory failure requiring BiPAP-in the setting of COPD exacerbation and COVID-19 pneumonia.  She was admitted to the [facility] upon stability-was transferred to the [facility] on 8/24.  Significant events: 8/22>> shortness of breath-requiring BiPAP-to ICU- COPD exac/COVID-19 pneumonia 8/24>> transfer to [facility].  Significant studies: 8/22>> CXR: Bibasilar/groundglass opacities. 8/25>> CXR: No pneumothorax-continues to have subtle bibasilar opacities (personally reviewed)  COVID-19 specific treatment:  Remdesivir: 8/22>> Steroids: 8/22>>  Microbiology data: 8/22>> COVID PCR: Positive 8/22>> blood culture: No growth 8/23>> blood culture: No growth  Procedures : None  Consults:  PCCM  DVT Prophylaxis : heparin injection 5,000 Units Start: 09/17/20 0600 SCDs Start: 09/16/20 0035   Subjective: Slowly improving-continues to feel overall better-around 4-5 L of oxygen at rest-but gets profoundly short of breath with minimal exertion.  When working with physical therapy requiring 15 L NRB for transfers.  Assessment/Plan: Acute on chronic hypoxic respiratory failure due to COPD exacerbation in the setting of COVID-19 pneumonia: Slowly improving-on 4-7 L of oxygen at rest (at baseline) but requiring 15 L/NRB with minimal ambulation.  Hardly any wheezing today-decrease steroids further-continue bronchodilators-although no signs of volume overload-we will give 1 dose of IV Lasix to see if this improves exertional  dyspnea.  Continue to mobilize as much as possible.    Note-patient on chronic steroids at home-we will need to be tapered back down to her usual regimen.  Recent Labs    09/17/20 0751 09/18/20 0111 09/19/20 0216  DDIMER 1.12* 0.72* 0.66*  CRP 4.9* 2.5* 1.1*    AKI: Improved-AKI was likely hemodynamically mediated.  Macrocytic anemia: Continue to monitor hemoglobin-B12/folate levels are stable.    CAD: No anginal symptoms-PCI apparently done in 2019-continue antiplatelet agents/statin/beta-blocker.  HTN: BP stable-continue metoprolol/losartan and amlodipine.    Remote history of lung adenocarcinoma-s/p radiation therapy: Continue outpatient surveillance.  Obesity: Estimated body mass index is 32.26 kg/m as calculated from the following:   Height as of this encounter: 5\' 2"  (1.575 m).   Weight as of this encounter: 80 kg.   Diet: Diet Order             Diet heart healthy/carb modified Room service appropriate? Yes; Fluid consistency: Thin  Diet effective now                    Code Status: Full code   Family Communication: Spouse-Ronnie Mersereau [phone] updated over the phone on 8/25  Disposition Plan: Status is: Inpatient  Remains inpatient appropriate because:Inpatient level of care appropriate due to severity of illness  Dispo: The patient is from: Home              Anticipated d/c is to: Home              Patient currently is not medically stable to d/c.   Difficult to place patient No   Barriers to Discharge: Hypoxia-COVID-19-needs 5 days of Remdesivir-noted stable for  discharge.  Still with significant exertional dyspnea requiring NRB.  Antimicrobial agents: Anti-infectives (From admission, onward)    Start     Dose/Rate Route Frequency Ordered Stop   09/17/20 1000  remdesivir 100 mg in sodium chloride 0.9 % 100 mL IVPB       See Hyperspace for full Linked Orders Report.   100 mg 200 mL/hr over 30 Minutes Intravenous Daily 09/16/20 0048  09/21/20 0959   09/16/20 0200  remdesivir 200 mg in sodium chloride 0.9% 250 mL IVPB       See Hyperspace for full Linked Orders Report.   200 mg 580 mL/hr over 30 Minutes Intravenous Once 09/16/20 0048 09/16/20 0254        Time spent: 35 minutes-Greater than 50% of this time was spent in counseling, explanation of diagnosis, planning of further management, and coordination of care.  MEDICATIONS: Scheduled Meds:  amLODipine  5 mg Oral Daily   aspirin EC  81 mg Oral Daily   budesonide  1 puff Inhalation BID   Chlorhexidine Gluconate Cloth  6 each Topical Q0600   cholecalciferol  1,000 Units Oral Daily   clopidogrel  75 mg Oral Daily   ferrous sulfate  325 mg Oral Q1400   heparin  5,000 Units Subcutaneous Q8H   insulin aspart  0-20 Units Subcutaneous TID WC   losartan  50 mg Oral Daily   methylPREDNISolone (SOLU-MEDROL) injection  40 mg Intravenous Q12H   metoprolol succinate  25 mg Oral Daily   pantoprazole  40 mg Oral Daily   sertraline  100 mg Oral QHS   umeclidinium-vilanterol  1 puff Inhalation Daily   Continuous Infusions:  remdesivir 100 mg in NS 100 mL 100 mg (09/19/20 1014)   PRN Meds:.acetaminophen, albuterol, benzonatate, docusate sodium, guaiFENesin-dextromethorphan, Ipratropium-Albuterol, ondansetron (ZOFRAN) IV, polyethylene glycol   PHYSICAL EXAM: Vital signs: Vitals:   09/19/20 0012 09/19/20 0400 09/19/20 0728 09/19/20 1217  BP: 140/69 (!) 154/62 (!) 145/87 (!) 161/58  Pulse: 62 68 71 75  Resp: (!) 22 (!) 22 20 20   Temp: 98.2 F (36.8 C) 97.9 F (36.6 C) 98 F (36.7 C) 98 F (36.7 C)  TempSrc: Oral Oral Oral Oral  SpO2:  96% 100% 92%  Weight:      Height:       Filed Weights   09/15/20 2111 09/16/20 0254 09/17/20 0420  Weight: 72.6 kg 74.7 kg 80 kg   Body mass index is 32.26 kg/m.   Gen Exam:Alert awake-not in any distress HEENT:atraumatic, normocephalic Chest: Few scattered rhonchi. CVS:S1S2 regular Abdomen:soft non tender, non  distended Extremities:no edema Neurology: Non focal Skin: no rash   I have personally reviewed following labs and imaging studies  LABORATORY DATA: CBC: Recent Labs  Lab 09/15/20 2106 09/15/20 2133 09/16/20 0429 09/16/20 0512 09/17/20 0751 09/18/20 0111 09/19/20 0216  WBC 9.9  --  2.8*  --  6.5 9.0 8.1  NEUTROABS 6.6  --   --   --   --   --   --   HGB 9.8*   < > 9.2* 10.2* 9.5* 9.6* 10.0*  HCT 32.3*   < > 30.9* 30.0* 31.8* 31.5* 33.2*  MCV 102.5*  --  101.6*  --  100.6* 100.6* 100.9*  PLT 158  --  105*  --  150 174 182   < > = values in this interval not displayed.     Basic Metabolic Panel: Recent Labs  Lab 09/15/20 2106 09/15/20 2133 09/16/20 7425 09/16/20 9563 09/17/20 0751  09/18/20 0111 09/19/20 0216  NA 139   < > 139 141 141 141 141  K 3.3*   < > 4.5 4.3 4.5 4.3 4.5  CL 104  --  107  --  105 106 105  CO2 26  --  22  --  30 30 28   GLUCOSE 252*  --  223*  --  205* 165* 258*  BUN 18  --  19  --  25* 25* 31*  CREATININE 1.25*  --  1.15*  --  1.12* 1.03* 1.15*  CALCIUM 8.9  --  8.7*  --  8.9 8.8* 9.2  MG  --   --  2.1  --   --   --   --   PHOS  --   --  3.8  --   --   --   --    < > = values in this interval not displayed.     GFR: Estimated Creatinine Clearance: 42.1 mL/min (A) (by C-G formula based on SCr of 1.15 mg/dL (H)).  Liver Function Tests: Recent Labs  Lab 09/15/20 2106 09/17/20 0751 09/18/20 0111 09/19/20 0216  AST 39 47* 44* 43*  ALT 28 35 38 50*  ALKPHOS 41 34* 36* 44  BILITOT 0.5 0.6 0.6 0.8  PROT 6.6 6.6 6.2* 6.4*  ALBUMIN 3.4* 3.3* 3.1* 3.3*    No results for input(s): LIPASE, AMYLASE in the last 168 hours. No results for input(s): AMMONIA in the last 168 hours.  Coagulation Profile: No results for input(s): INR, PROTIME in the last 168 hours.  Cardiac Enzymes: No results for input(s): CKTOTAL, CKMB, CKMBINDEX, TROPONINI in the last 168 hours.  BNP (last 3 results) No results for input(s): PROBNP in the last 8760  hours.  Lipid Profile: No results for input(s): CHOL, HDL, LDLCALC, TRIG, CHOLHDL, LDLDIRECT in the last 72 hours.   Thyroid Function Tests: No results for input(s): TSH, T4TOTAL, FREET4, T3FREE, THYROIDAB in the last 72 hours.  Anemia Panel: Recent Labs    09/18/20 0111  VITAMINB12 3,802*  FOLATE 17.9     Urine analysis: No results found for: COLORURINE, APPEARANCEUR, LABSPEC, PHURINE, GLUCOSEU, HGBUR, BILIRUBINUR, KETONESUR, PROTEINUR, UROBILINOGEN, NITRITE, LEUKOCYTESUR  Sepsis Labs: Lactic Acid, Venous    Component Value Date/Time   LATICACIDVEN 1.3 09/15/2020 2106    MICROBIOLOGY: Recent Results (from the past 240 hour(s))  Resp Panel by RT-PCR (Flu A&B, Covid) Nasopharyngeal Swab     Status: Abnormal   Collection Time: 09/15/20  9:04 PM   Specimen: Nasopharyngeal Swab; Nasopharyngeal(NP) swabs in vial transport medium  Result Value Ref Range Status   SARS Coronavirus 2 by RT PCR POSITIVE (A) NEGATIVE Final    Comment: RESULT CALLED TO, READ BACK BY AND VERIFIED WITH: K SIMMERSON,RN@[id number]  09/15/20 MKELLY (NOTE) SARS-CoV-2 target nucleic acids are DETECTED.  The SARS-CoV-2 RNA is generally detectable in upper respiratory specimens during the acute phase of infection. Positive results are indicative of the presence of the identified virus, but do not rule out bacterial infection or co-infection with other pathogens not detected by the test. Clinical correlation with patient history and other diagnostic information is necessary to determine patient infection status. The expected result is Negative.  Fact Sheet for Patients: EntrepreneurPulse.com.au  Fact Sheet for Healthcare Providers: IncredibleEmployment.be  This test is not yet approved or cleared by the [place] FDA and  has been authorized for detection and/or diagnosis of SARS-CoV-2 by FDA under an Emergency Use Authorization (EUA).  This EUA will remain  in effect  (meaning this test can  be used) for the duration of  the COVID-19 declaration under Section 564(b)(1) of the Act, 21 U.S.C. section 360bbb-3(b)(1), unless the authorization is terminated or revoked sooner.     Influenza A by PCR NEGATIVE NEGATIVE Final   Influenza B by PCR NEGATIVE NEGATIVE Final    Comment: (NOTE) The Xpert Xpress SARS-CoV-2/FLU/RSV plus assay is intended as an aid in the diagnosis of influenza from Nasopharyngeal swab specimens and should not be used as a sole basis for treatment. Nasal washings and aspirates are unacceptable for Xpert Xpress SARS-CoV-2/FLU/RSV testing.  Fact Sheet for Patients: EntrepreneurPulse.com.au  Fact Sheet for Healthcare Providers: IncredibleEmployment.be  This test is not yet approved or cleared by the [place] FDA and has been authorized for detection and/or diagnosis of SARS-CoV-2 by FDA under an Emergency Use Authorization (EUA). This EUA will remain in effect (meaning this test can be used) for the duration of the COVID-19 declaration under Section 564(b)(1) of the Act, 21 U.S.C. section 360bbb-3(b)(1), unless the authorization is terminated or revoked.  Performed at [facility], [place] [address], [place], [place] [address]   Blood Culture (routine x 2)     Status: None (Preliminary result)   Collection Time: 09/15/20  9:34 PM   Specimen: BLOOD LEFT HAND  Result Value Ref Range Status   Specimen Description BLOOD LEFT HAND  Final   Special Requests   Final    BOTTLES DRAWN AEROBIC AND ANAEROBIC Blood Culture adequate volume   Culture   Final    NO GROWTH 4 DAYS Performed at [facility], [place] [address] [place], [place] [address]    Report Status PENDING  Incomplete  MRSA Next Gen by PCR, Nasal     Status: None   Collection Time: 09/16/20  2:47 AM   Specimen: Nasal Mucosa; Nasal Swab  Result Value Ref Range Status   MRSA by PCR Next Gen NOT DETECTED NOT DETECTED Final     Comment: (NOTE) The GeneXpert MRSA Assay (FDA approved for NASAL specimens only), is one component of a comprehensive MRSA colonization surveillance program. It is not intended to diagnose MRSA infection nor to guide or monitor treatment for MRSA infections. Test performance is not FDA approved in patients less than 43 years old. Performed at [facility], [place] [address] [place], [place] [address]   Blood Culture (routine x 2)     Status: None (Preliminary result)   Collection Time: 09/16/20  4:29 AM   Specimen: BLOOD  Result Value Ref Range Status   Specimen Description BLOOD RIGHT ANTECUBITAL  Final   Special Requests   Final    BOTTLES DRAWN AEROBIC ONLY Blood Culture results may not be optimal due to an inadequate volume of blood received in culture bottles   Culture   Final    NO GROWTH 3 DAYS Performed at [facility], [place] [address] [place], [place] [address]    Report Status PENDING  Incomplete    RADIOLOGY STUDIES/RESULTS: No results found.   LOS: 3 days   Oren Binet, MD  [facility]    To contact the attending provider between 7A-7P or the covering provider during after hours 7P-7A, please log into the web site [email or web address] and access using universal [place] password for that web site. If you do not have the password, please call the hospital operator.  09/19/2020, 2:41 PM

## 2020-09-19 NOTE — Progress Notes (Signed)
Physical Therapy Treatment Patient Details Name: Suzanne Dickson MRN: [id number] DOB: 1945-07-24 Today's Date: 09/19/2020    History of Present Illness Pt is a 75 year old woman admitted on 8/23/2 with acute on chronic respiratory failure and COVID 19. Required bipap. PMH: COPD on 4-7L depending on activity, HTN, CAD, remote lung ca, PAH, obesity.    PT Comments    Pt required min guard assist transfers. Pt anxious and requesting 15L NRB for transfers, maintaining SpO2 > 90%. Pt performed LE exercises seated in recliner on 5L via HFNC. Desat to 88%. 3/4 DOE. Max RR 37. Verbal cues for relaxation and deep breathing as pt becomes anxious when she feels SOB or starts coughing. Pt remained in recliner at end of session. Discharge recommendation updated to [facility].    Follow Up Recommendations  Home health PT     Equipment Recommendations  Rolling walker with 5" wheels    Recommendations for Other Services       Precautions / Restrictions Precautions Precautions: Fall;Other (comment) Precaution Comments: watch vitals    Mobility  Bed Mobility Overal bed mobility: Modified Independent                  Transfers Overall transfer level: Needs assistance Equipment used: Rolling walker (2 wheeled) Transfers: Sit to/from [employer] Transfers Sit to Stand: Min guard Stand pivot transfers: Min guard       General transfer comment: 15L NRB during transfer  Ambulation/Gait                 Stairs             Wheelchair Mobility    Modified Rankin (Stroke Patients Only)       Balance Overall balance assessment: Needs assistance Sitting-balance support: Feet supported;No upper extremity supported Sitting balance-Leahy Scale: Good     Standing balance support: Bilateral upper extremity supported;During functional activity Standing balance-Leahy Scale: Fair Standing balance comment: static stand without UE support                             Cognition Arousal/Alertness: Awake/alert Behavior During Therapy: Flat affect;Anxious Overall Cognitive Status: Within Functional Limits for tasks assessed                                 General Comments: becomes anxious with mobility/DOE      Exercises General Exercises - Lower Extremity Ankle Circles/Pumps: AROM;Both;20 reps;Seated Long Arc Quad: AROM;Right;Left;10 reps;Seated Hip ABduction/ADduction: AROM;Right;Left;10 reps;Seated Hip Flexion/Marching: AROM;Right;Left;10 reps;Seated    General Comments General comments (skin integrity, edema, etc.): Pt on 5L via HFNC. 15L NRB added for transfer bed to recliner maintaining SpO2 > 90%. LE exercises in recliner on 5L with desat to 88%. Max RR 32. 3/4 DOE. Pt becomes anxious with mobility, demonstrating shallow breathing which cause desat and increased RR.      Pertinent Vitals/Pain Pain Assessment: No/denies pain    Home Living                      Prior Function            PT Goals (current goals can now be found in the care plan section) Acute Rehab PT Goals Patient Stated Goal: home Progress towards PT goals: Progressing toward goals    Frequency    Min 3X/week      PT  Plan Discharge plan needs to be updated;Frequency needs to be updated    Co-evaluation              AM-PAC PT "6 Clicks" Mobility   Outcome Measure  Help needed turning from your back to your side while in a flat bed without using bedrails?: A Little Help needed moving from lying on your back to sitting on the side of a flat bed without using bedrails?: A Little Help needed moving to and from a bed to a chair (including a wheelchair)?: A Little Help needed standing up from a chair using your arms (e.g., wheelchair or bedside chair)?: A Little Help needed to walk in hospital room?: A Little Help needed climbing 3-5 steps with a railing? : A Lot 6 Click Score: 17    End of Session Equipment Utilized During  Treatment: Oxygen Activity Tolerance: Treatment limited secondary to medical complications (Comment) (DOE, anxiety) Patient left: in chair;with call bell/phone within reach Nurse Communication: Mobility status PT Visit Diagnosis: Unsteadiness on feet (R26.81);Muscle weakness (generalized) (M62.81);Dizziness and giddiness (R42);Adult, failure to thrive (R62.7);Difficulty in walking, not elsewhere classified (R26.2)     Time: 8546-2703 PT Time Calculation (min) (ACUTE ONLY): 14 min  Charges:  $Therapeutic Exercise: 8-22 mins                     Lorrin Goodell, PT  Office # [phone] Pager [phone]    Lorriane Shire 09/19/2020, 10:50 AM

## 2020-09-19 NOTE — Progress Notes (Signed)
   NAME:  Suzanne Dickson, MRN:  [id number], DOB:  October 23, 1945, LOS: 3 ADMISSION DATE:  09/15/2020 CONSULTATION DATE:  09/15/2020 REFERRING MD:  Joya Gaskins - EDP CHIEF COMPLAINT:  Hypoxia, COVID+   History of Present Illness:  75 year old woman with PMHx significant for HTN, CAD, pulmonary HTN, OSA, COPD (on home O2 4-7L) and lung adenocarcinoma who presented to [facility] ED via EMS 8/22 for SOB, decreased O2 sats. Per EMS, patient was satting in 69s on her home O2 on arrival. Treated for AECOPD in the field with Solumedrol, Duo-Neb, and Epi. Patient tested positive for COVID on 8/17 (previously vaccinated x 2 and boosted x 1) but did not receive treatment.  On arrival to ED, patient transitioned to BiPAP (9/5, FiO2 45%) with improvement in O2 sats to 90s, RR to 20s (from 50s) and subjective relief. CXR demonstrated bibasilar interstitial and GGO consistent with atypical PNA. Labs were notable for mild anemia (Hgb 9.3), elevated D-Dimer, K 3.3,  glucose 252, Cr 1.25 (baseline 0.9-1.1).  PCCM consulted for ICU admission and COVID management.  Pertinent Medical History:  Adenocarcinoma of the lung  CAD HTN OSA  Pulmonary HTN  Significant Hospital Events:  8/22 - Contacted EMS at advice of Pulmonary MD for increased SOB, decreased O2 sats. Sats 60% on EMS arrival on home O2 7L. Positive for COVID 8/17. Solumedrol, Epi, Duo-Neb given with some relief. Placed on BiPAP on arrival to ED with clinical improvement. CCM consulted for admission. 8/23 no acute events overnight  Interim History / Subjective:  Continues to improve.  She is on 4 L nasal cannula and was able to do marching steps in place with slight decrease in O2 saturations possibly 2 minutes spent on nonrebreather O2 saturation quickly improved to 91 %  Objective:  Blood pressure (!) 145/87, pulse 71, temperature 98 F (36.7 C), temperature source Oral, resp. rate 20, height 5\' 2"  (1.575 m), weight 80 kg, SpO2 100 %.        Intake/Output  Summary (Last 24 hours) at 09/19/2020 1120 Last data filed at 09/19/2020 0900 Gross per 24 hour  Intake 440 ml  Output 500 ml  Net -60 ml   Filed Weights   09/15/20 2111 09/16/20 0254 09/17/20 0420  Weight: 72.6 kg 74.7 kg 80 kg   Physical Examination: General: Obese female very motivated and upbeat HEENT: MM pink/moist no JVD or lymphadenopathy Neuro: Grossly intact without focal defect CV: Heart sounds are regular PULM:  Currently on 4 L nasal cannula:  GI soft, bsx4 active  GU: Voids Extremities: warm/dry, negative edema  Skin: no rashes or lesions   Resolved Hospital Problem List     Assessment & Plan:  Acute on chronic hypoxic respiratory failure in the setting of AECOPD caused by COVID pneumonia OSA -Followed by Dr. Melvyn Novas for management of COPD, OSA -Home oxygen of 4-6L continuously P: Complete antiviral medications for COVID She continues to improve on a daily basis and is approaching baseline. Wean steroids slowly back to her home steroid dosing Continue bronchodilators       History of HTN Per primary History of CAD Per primary   History of lung adenocarcinoma -Remote history of lung adenocarcinoma, s/p radiation therapy. P: Outpatient surveillance   PCCM will see Monday.  Please call if needed sooner  Best Practice:    Per primary     Richardson Landry Deanna Boehlke ACNP Acute Care Nurse Practitioner [place] Please consult Amion 09/19/2020, 11:21 AM

## 2020-09-19 NOTE — Progress Notes (Signed)
Occupational Therapy Treatment Patient Details Name: Suzanne Dickson MRN: [id number] DOB: 1945-09-15 Today's Date: 09/19/2020    History of present illness Pt is a 75 year old woman admitted on 8/23/2 with acute on chronic respiratory failure and COVID 19. Required bipap. PMH: COPD on 4-7L depending on activity, HTN, CAD, remote lung ca, PAH, obesity.   OT comments  Patient sitting up in recliner.  Patient was setup and supervision to perform grooming with oral hygiene and face washing.  Patient's O2 remained in upper 80's during grooming.  Standing performed from recliner to RW with vcs and supervision.  Patient's O2 would drop to low 80's following standing for approximately 30-1 minute and required 15l with NRB on last stand. Patient performed marching in place seated and standing to increase endurance.   Follow Up Recommendations  Home health OT    Equipment Recommendations  None recommended by OT    Recommendations for Other Services      Precautions / Restrictions Precautions Precautions: Fall;Other (comment) Precaution Comments: watch vitals       Mobility Bed Mobility Overal bed mobility: Modified Independent             General bed mobility comments: up in chair when OT arrived    Transfers Overall transfer level: Needs assistance Equipment used: Rolling walker (2 wheeled) Transfers: Sit to/from Stand Sit to Stand: Supervision Stand pivot transfers: Min guard       General transfer comment: 15L NRB during transfer    Balance Overall balance assessment: Needs assistance Sitting-balance support: Feet supported;No upper extremity supported Sitting balance-Leahy Scale: Good Sitting balance - Comments:  (O2 sats in upper 80s when sitting)   Standing balance support: Bilateral upper extremity supported;During functional activity Standing balance-Leahy Scale: Fair Standing balance comment:  (Static standing with UE support with RW)                            ADL either performed or assessed with clinical judgement   ADL Overall ADL's : Needs assistance/impaired     Grooming: Wash/dry face;Set up;Sitting;Oral care                                       Vision       Perception     Praxis      Cognition Arousal/Alertness: Awake/alert Behavior During Therapy: Flat affect;Anxious Overall Cognitive Status: Within Functional Limits for tasks assessed                                 General Comments: Becomes anxious following endurance exercises in standing        Exercises General Exercises - Lower Extremity Ankle Circles/Pumps: AROM;Both;20 reps;Seated Long Arc Quad: AROM;Right;Left;10 reps;Seated Hip ABduction/ADduction: AROM;Right;Left;10 reps;Seated Hip Flexion/Marching: AROM;Right;Left;10 reps;Seated   Shoulder Instructions       General Comments Pt on 5L via HFNC. 15L NRB added for transfer bed to recliner maintaining SpO2 > 90%. LE exercises in recliner on 5L with desat to 88%. Max RR 85. 3/4 DOE. Pt becomes anxious with mobility, demonstrating shallow breathing which cause desat and increased RR.    Pertinent Vitals/ Pain       Pain Assessment: No/denies pain  Home Living  Prior Functioning/Environment              Frequency  Min 2X/week        Progress Toward Goals  OT Goals(current goals can now be found in the care plan section)  Progress towards OT goals: Progressing toward goals  Acute Rehab OT Goals Patient Stated Goal: home OT Goal Formulation: With patient Time For Goal Achievement: 10/02/20 Potential to Achieve Goals: Good ADL Goals Pt Will Perform Grooming: with supervision;standing Pt Will Perform Upper Body Bathing: with set-up;sitting Pt Will Perform Lower Body Bathing: with supervision;sit to/from stand Pt Will Perform Upper Body Dressing: with set-up;sitting Pt Will Perform Lower  Body Dressing: with supervision;sit to/from stand Pt Will Transfer to Toilet: with supervision;ambulating Pt Will Perform Toileting - Clothing Manipulation and hygiene: with supervision;sit to/from stand Additional ADL Goal #1: Pt will tolerate ADL, maintaining Sp02 >88% with intermittent rest breaks on <7L 02  Plan Discharge plan remains appropriate    Co-evaluation                 AM-PAC OT "6 Clicks" Daily Activity     Outcome Measure   Help from another person eating meals?: None Help from another person taking care of personal grooming?: A Little Help from another person toileting, which includes using toliet, bedpan, or urinal?: A Little Help from another person bathing (including washing, rinsing, drying)?: A Little Help from another person to put on and taking off regular upper body clothing?: A Little Help from another person to put on and taking off regular lower body clothing?: A Little 6 Click Score: 19    End of Session Equipment Utilized During Treatment: Rolling walker  OT Visit Diagnosis: Unsteadiness on feet (R26.81);Other (comment)   Activity Tolerance Treatment limited secondary to medical complications (Comment)   Patient Left in chair;with call bell/phone within reach   Nurse Communication  (Cummunicated O2 need during treatment and improved endurance)        Time: 1035-1101 OT Time Calculation (min): 26 min  Charges: OT General Charges $OT Visit: 1 Visit OT Treatments $Self Care/Home Management : 8-22 mins $Therapeutic Activity: 8-22 mins  Lodema Hong, OTA    Suzanne Dickson 09/19/2020, 11:31 AM

## 2020-09-20 DIAGNOSIS — J441 Chronic obstructive pulmonary disease with (acute) exacerbation: Secondary | ICD-10-CM | POA: Diagnosis not present

## 2020-09-20 DIAGNOSIS — J9621 Acute and chronic respiratory failure with hypoxia: Secondary | ICD-10-CM | POA: Diagnosis not present

## 2020-09-20 DIAGNOSIS — D539 Nutritional anemia, unspecified: Secondary | ICD-10-CM | POA: Diagnosis not present

## 2020-09-20 DIAGNOSIS — U071 COVID-19: Secondary | ICD-10-CM | POA: Diagnosis not present

## 2020-09-20 LAB — GLUCOSE, CAPILLARY
Glucose-Capillary: 168 mg/dL — ABNORMAL HIGH (ref 70–99)
Glucose-Capillary: 240 mg/dL — ABNORMAL HIGH (ref 70–99)
Glucose-Capillary: 204 mg/dL — ABNORMAL HIGH (ref 70–99)
Glucose-Capillary: 192 mg/dL — ABNORMAL HIGH (ref 70–99)
Glucose-Capillary: 208 mg/dL — ABNORMAL HIGH (ref 70–99)

## 2020-09-20 LAB — COMPREHENSIVE METABOLIC PANEL
ALT: 42 U/L (ref 0–44)
AST: 25 U/L (ref 15–41)
Albumin: 3.3 g/dL — ABNORMAL LOW (ref 3.5–5.0)
Alkaline Phosphatase: 46 U/L (ref 38–126)
Anion gap: 6 (ref 5–15)
BUN: 30 mg/dL — ABNORMAL HIGH (ref 8–23)
CO2: 33 mmol/L — ABNORMAL HIGH (ref 22–32)
Calcium: 9.3 mg/dL (ref 8.9–10.3)
Chloride: 101 mmol/L (ref 98–111)
Creatinine, Ser: 1.12 mg/dL — ABNORMAL HIGH (ref 0.44–1.00)
GFR, Estimated: 52 mL/min — ABNORMAL LOW (ref >60)
Glucose, Bld: 181 mg/dL — ABNORMAL HIGH (ref 70–99)
Potassium: 3.8 mmol/L (ref 3.5–5.1)
Sodium: 140 mmol/L (ref 135–145)
Total Bilirubin: 1 mg/dL (ref 0.3–1.2)
Total Protein: 6.3 g/dL — ABNORMAL LOW (ref 6.5–8.1)

## 2020-09-20 LAB — C-REACTIVE PROTEIN: CRP: 0.8 mg/dL (ref <1.0)

## 2020-09-20 LAB — CBC
HCT: 32.4 % — ABNORMAL LOW (ref 36.0–46.0)
Hemoglobin: 9.9 g/dL — ABNORMAL LOW (ref 12.0–15.0)
MCH: 30.3 pg (ref 26.0–34.0)
MCHC: 30.6 g/dL (ref 30.0–36.0)
MCV: 99.1 fL (ref 80.0–100.0)
Platelets: 193 10*3/uL (ref 150–400)
RBC: 3.27 MIL/uL — ABNORMAL LOW (ref 3.87–5.11)
RDW: 11.9 % (ref 11.5–15.5)
WBC: 9.9 10*3/uL (ref 4.0–10.5)
nRBC: 0 % (ref 0.0–0.2)

## 2020-09-20 LAB — CULTURE, BLOOD (ROUTINE X 2)
Culture: NO GROWTH
Special Requests: ADEQUATE

## 2020-09-20 LAB — D-DIMER, QUANTITATIVE: D-Dimer, Quant: 0.43 ug/mL-FEU (ref 0.00–0.50)

## 2020-09-20 NOTE — Progress Notes (Signed)
PROGRESS NOTE        PATIENT DETAILS Name: Suzanne Dickson Age: 75 y.o. Sex: female Date of Birth: 1945/05/25 Admit Date: 09/15/2020 Admitting Physician Renee Pain, MD EHM:[id number], Prudencio Burly, MD  Brief Narrative: Patient is a 75 y.o. female with history of COPD (on 4-7 L of oxygen at home), HTN, CAD, PAH, adenocarcinoma of lung (remote history) who presented to the ED with shortness of breath-she was found to have acute hypoxic respiratory failure requiring BiPAP-in the setting of COPD exacerbation and COVID-19 pneumonia.  She was admitted to the [facility] upon stability-was transferred to the [facility] on 8/24.  Significant events: 8/22>> shortness of breath-requiring BiPAP-to ICU- COPD exac/COVID-19 pneumonia 8/24>> transfer to [facility].  Significant studies: 8/22>> CXR: Bibasilar/groundglass opacities. 8/25>> CXR: No pneumothorax-continues to have subtle bibasilar opacities (personally reviewed)  COVID-19 specific treatment:  Remdesivir: 8/22>>8/27 Steroids: 8/22>>  Microbiology data: 8/22>> COVID PCR: Positive 8/22>> blood culture: No growth 8/23>> blood culture: No growth  Procedures : None  Consults:  PCCM  DVT Prophylaxis : heparin injection 5,000 Units Start: 09/17/20 0600 SCDs Start: 09/16/20 0035   Subjective: Stable on 4-5 L of oxygen at rest this morning-acknowledges more shortness of breath than baseline with ambulation.  Understands that since he is requiring 15 L/NRB with ambulation she cannot be discharged home.  Assessment/Plan: Acute on chronic hypoxic respiratory failure due to COPD exacerbation in the setting of COVID-19 pneumonia: Overall improved-back to usual baseline of 4-7 L of oxygen at rest-but requiring 15 L/NRB with ambulation.  No wheezing today-no evidence of volume overload.  Plan is to continue supportive care-continue to taper steroids.  She received diuretics yesterday-do not think she needs any  further diuretics at this point.  Continue to mobilize as much as possible   Note-patient on chronic steroids at home-we will need to be tapered back down to her usual regimen.  Recent Labs    09/18/20 0111 09/19/20 0216 09/20/20 0124  DDIMER 0.72* 0.66* 0.43  CRP 2.5* 1.1* 0.8    AKI: Improved-AKI was likely hemodynamically mediated.  Macrocytic anemia: Continue to monitor hemoglobin-B12/folate levels are stable.    CAD: No anginal symptoms-PCI apparently done in 2019-continue antiplatelet agents/statin/beta-blocker.  HTN: BP stable-continue metoprolol/losartan and amlodipine.    Remote history of lung adenocarcinoma-s/p radiation therapy: Continue outpatient surveillance.  Obesity: Estimated body mass index is 32.54 kg/m as calculated from the following:   Height as of this encounter: 5\' 2"  (1.575 m).   Weight as of this encounter: 80.7 kg.   Diet: Diet Order             Diet heart healthy/carb modified Room service appropriate? Yes; Fluid consistency: Thin  Diet effective now                    Code Status: Full code   Family Communication: Spouse-Ronnie Manalo [phone] updated over the phone on 8/25  Disposition Plan: Status is: Inpatient  Remains inpatient appropriate because:Inpatient level of care appropriate due to severity of illness  Dispo: The patient is from: Home              Anticipated d/c is to: Home              Patient currently is not medically stable to d/c.   Difficult to place patient No   Barriers to Discharge:  Hypoxia-COVID-19-needs 5 days of Remdesivir-noted stable for discharge.  Still with significant exertional dyspnea requiring NRB.  Antimicrobial agents: Anti-infectives (From admission, onward)    Start     Dose/Rate Route Frequency Ordered Stop   09/17/20 1000  remdesivir 100 mg in sodium chloride 0.9 % 100 mL IVPB       See Hyperspace for full Linked Orders Report.   100 mg 200 mL/hr over 30 Minutes Intravenous  Daily 09/16/20 0048 09/20/20 1036   09/16/20 0200  remdesivir 200 mg in sodium chloride 0.9% 250 mL IVPB       See Hyperspace for full Linked Orders Report.   200 mg 580 mL/hr over 30 Minutes Intravenous Once 09/16/20 0048 09/16/20 0254        Time spent: 35 minutes-Greater than 50% of this time was spent in counseling, explanation of diagnosis, planning of further management, and coordination of care.  MEDICATIONS: Scheduled Meds:  amLODipine  5 mg Oral Daily   aspirin EC  81 mg Oral Daily   budesonide  1 puff Inhalation BID   Chlorhexidine Gluconate Cloth  6 each Topical Q0600   cholecalciferol  1,000 Units Oral Daily   clopidogrel  75 mg Oral Daily   ferrous sulfate  325 mg Oral Q1400   heparin  5,000 Units Subcutaneous Q8H   insulin aspart  0-20 Units Subcutaneous TID WC   losartan  50 mg Oral Daily   methylPREDNISolone (SOLU-MEDROL) injection  40 mg Intravenous Q12H   metoprolol succinate  25 mg Oral Daily   pantoprazole  40 mg Oral Daily   sertraline  100 mg Oral QHS   umeclidinium-vilanterol  1 puff Inhalation Daily   Continuous Infusions:   PRN Meds:.acetaminophen, albuterol, benzonatate, docusate sodium, guaiFENesin-dextromethorphan, Ipratropium-Albuterol, ondansetron (ZOFRAN) IV, polyethylene glycol   PHYSICAL EXAM: Vital signs: Vitals:   09/20/20 0500 09/20/20 0738 09/20/20 0900 09/20/20 1231  BP:  (!) 167/69 (!) 165/70 (!) 149/64  Pulse:  70 82 73  Resp:  17 20 18   Temp:  98.3 F (36.8 C) 97.8 F (36.6 C) 97.9 F (36.6 C)  TempSrc:  Oral Oral Oral  SpO2:  98% 95% 95%  Weight: 80.7 kg     Height:       Filed Weights   09/16/20 0254 09/17/20 0420 09/20/20 0500  Weight: 74.7 kg 80 kg 80.7 kg   Body mass index is 32.54 kg/m.   Gen Exam:Alert awake-not in any distress HEENT:atraumatic, normocephalic Chest: B/L clear to auscultation anteriorly CVS:S1S2 regular Abdomen:soft non tender, non distended Extremities:no edema Neurology: Non  focal Skin: no rash   I have personally reviewed following labs and imaging studies  LABORATORY DATA: CBC: Recent Labs  Lab 09/15/20 2106 09/15/20 2133 09/16/20 0429 09/16/20 0512 09/17/20 0751 09/18/20 0111 09/19/20 0216 09/20/20 0124  WBC 9.9  --  2.8*  --  6.5 9.0 8.1 9.9  NEUTROABS 6.6  --   --   --   --   --   --   --   HGB 9.8*   < > 9.2* 10.2* 9.5* 9.6* 10.0* 9.9*  HCT 32.3*   < > 30.9* 30.0* 31.8* 31.5* 33.2* 32.4*  MCV 102.5*  --  101.6*  --  100.6* 100.6* 100.9* 99.1  PLT 158  --  105*  --  150 174 182 193   < > = values in this interval not displayed.     Basic Metabolic Panel: Recent Labs  Lab 09/16/20 0429 09/16/20 0512 09/17/20 0751 09/18/20  0111 09/19/20 0216 09/20/20 0124  NA 139 141 141 141 141 140  K 4.5 4.3 4.5 4.3 4.5 3.8  CL 107  --  105 106 105 101  CO2 22  --  30 30 28  33*  GLUCOSE 223*  --  205* 165* 258* 181*  BUN 19  --  25* 25* 31* 30*  CREATININE 1.15*  --  1.12* 1.03* 1.15* 1.12*  CALCIUM 8.7*  --  8.9 8.8* 9.2 9.3  MG 2.1  --   --   --   --   --   PHOS 3.8  --   --   --   --   --      GFR: Estimated Creatinine Clearance: 43.3 mL/min (A) (by C-G formula based on SCr of 1.12 mg/dL (H)).  Liver Function Tests: Recent Labs  Lab 09/15/20 2106 09/17/20 0751 09/18/20 0111 09/19/20 0216 09/20/20 0124  AST 39 47* 44* 43* 25  ALT 28 35 38 50* 42  ALKPHOS 41 34* 36* 44 46  BILITOT 0.5 0.6 0.6 0.8 1.0  PROT 6.6 6.6 6.2* 6.4* 6.3*  ALBUMIN 3.4* 3.3* 3.1* 3.3* 3.3*    No results for input(s): LIPASE, AMYLASE in the last 168 hours. No results for input(s): AMMONIA in the last 168 hours.  Coagulation Profile: No results for input(s): INR, PROTIME in the last 168 hours.  Cardiac Enzymes: No results for input(s): CKTOTAL, CKMB, CKMBINDEX, TROPONINI in the last 168 hours.  BNP (last 3 results) No results for input(s): PROBNP in the last 8760 hours.  Lipid Profile: No results for input(s): CHOL, HDL, LDLCALC, TRIG, CHOLHDL,  LDLDIRECT in the last 72 hours.   Thyroid Function Tests: No results for input(s): TSH, T4TOTAL, FREET4, T3FREE, THYROIDAB in the last 72 hours.  Anemia Panel: Recent Labs    09/18/20 0111  VITAMINB12 3,802*  FOLATE 17.9     Urine analysis: No results found for: COLORURINE, APPEARANCEUR, LABSPEC, PHURINE, GLUCOSEU, HGBUR, BILIRUBINUR, KETONESUR, PROTEINUR, UROBILINOGEN, NITRITE, LEUKOCYTESUR  Sepsis Labs: Lactic Acid, Venous    Component Value Date/Time   LATICACIDVEN 1.3 09/15/2020 2106    MICROBIOLOGY: Recent Results (from the past 240 hour(s))  Resp Panel by RT-PCR (Flu A&B, Covid) Nasopharyngeal Swab     Status: Abnormal   Collection Time: 09/15/20  9:04 PM   Specimen: Nasopharyngeal Swab; Nasopharyngeal(NP) swabs in vial transport medium  Result Value Ref Range Status   SARS Coronavirus 2 by RT PCR POSITIVE (A) NEGATIVE Final    Comment: RESULT CALLED TO, READ BACK BY AND VERIFIED WITH: K SIMMERSON,RN@[id number]  09/15/20 MKELLY (NOTE) SARS-CoV-2 target nucleic acids are DETECTED.  The SARS-CoV-2 RNA is generally detectable in upper respiratory specimens during the acute phase of infection. Positive results are indicative of the presence of the identified virus, but do not rule out bacterial infection or co-infection with other pathogens not detected by the test. Clinical correlation with patient history and other diagnostic information is necessary to determine patient infection status. The expected result is Negative.  Fact Sheet for Patients: EntrepreneurPulse.com.au  Fact Sheet for Healthcare Providers: IncredibleEmployment.be  This test is not yet approved or cleared by the [place] FDA and  has been authorized for detection and/or diagnosis of SARS-CoV-2 by FDA under an Emergency Use Authorization (EUA).  This EUA will remain in effect (meaning this test can  be used) for the duration of  the COVID-19 declaration under  Section 564(b)(1) of the Act, 21 U.S.C. section 360bbb-3(b)(1), unless the authorization is terminated or  revoked sooner.     Influenza A by PCR NEGATIVE NEGATIVE Final   Influenza B by PCR NEGATIVE NEGATIVE Final    Comment: (NOTE) The Xpert Xpress SARS-CoV-2/FLU/RSV plus assay is intended as an aid in the diagnosis of influenza from Nasopharyngeal swab specimens and should not be used as a sole basis for treatment. Nasal washings and aspirates are unacceptable for Xpert Xpress SARS-CoV-2/FLU/RSV testing.  Fact Sheet for Patients: EntrepreneurPulse.com.au  Fact Sheet for Healthcare Providers: IncredibleEmployment.be  This test is not yet approved or cleared by the [place] FDA and has been authorized for detection and/or diagnosis of SARS-CoV-2 by FDA under an Emergency Use Authorization (EUA). This EUA will remain in effect (meaning this test can be used) for the duration of the COVID-19 declaration under Section 564(b)(1) of the Act, 21 U.S.C. section 360bbb-3(b)(1), unless the authorization is terminated or revoked.  Performed at [facility], [place] [address], [place], [place] [address]   Blood Culture (routine x 2)     Status: None   Collection Time: 09/15/20  9:34 PM   Specimen: BLOOD LEFT HAND  Result Value Ref Range Status   Specimen Description BLOOD LEFT HAND  Final   Special Requests   Final    BOTTLES DRAWN AEROBIC AND ANAEROBIC Blood Culture adequate volume   Culture   Final    NO GROWTH 5 DAYS Performed at [facility], [place] [address] [place], [place] [address]    Report Status 09/20/2020 FINAL  Final  MRSA Next Gen by PCR, Nasal     Status: None   Collection Time: 09/16/20  2:47 AM   Specimen: Nasal Mucosa; Nasal Swab  Result Value Ref Range Status   MRSA by PCR Next Gen NOT DETECTED NOT DETECTED Final    Comment: (NOTE) The GeneXpert MRSA Assay (FDA approved for NASAL specimens only), is one  component of a comprehensive MRSA colonization surveillance program. It is not intended to diagnose MRSA infection nor to guide or monitor treatment for MRSA infections. Test performance is not FDA approved in patients less than 41 years old. Performed at [facility], [place] [address] [place], [place] [address]   Blood Culture (routine x 2)     Status: None (Preliminary result)   Collection Time: 09/16/20  4:29 AM   Specimen: BLOOD  Result Value Ref Range Status   Specimen Description BLOOD RIGHT ANTECUBITAL  Final   Special Requests   Final    BOTTLES DRAWN AEROBIC ONLY Blood Culture results may not be optimal due to an inadequate volume of blood received in culture bottles   Culture   Final    NO GROWTH 4 DAYS Performed at [facility], [place] [address] [place], [place] [address]    Report Status PENDING  Incomplete    RADIOLOGY STUDIES/RESULTS: No results found.   LOS: 4 days   Oren Binet, MD  [facility]    To contact the attending provider between 7A-7P or the covering provider during after hours 7P-7A, please log into the web site [email or web address] and access using universal [place] password for that web site. If you do not have the password, please call the hospital operator.  09/20/2020, 2:03 PM

## 2020-09-21 DIAGNOSIS — U071 COVID-19: Secondary | ICD-10-CM | POA: Diagnosis not present

## 2020-09-21 DIAGNOSIS — J441 Chronic obstructive pulmonary disease with (acute) exacerbation: Secondary | ICD-10-CM | POA: Diagnosis not present

## 2020-09-21 DIAGNOSIS — J9621 Acute and chronic respiratory failure with hypoxia: Secondary | ICD-10-CM | POA: Diagnosis not present

## 2020-09-21 DIAGNOSIS — J9622 Acute and chronic respiratory failure with hypercapnia: Secondary | ICD-10-CM | POA: Diagnosis not present

## 2020-09-21 LAB — COMPREHENSIVE METABOLIC PANEL
ALT: 35 U/L (ref 0–44)
AST: 22 U/L (ref 15–41)
Albumin: 3.2 g/dL — ABNORMAL LOW (ref 3.5–5.0)
Alkaline Phosphatase: 42 U/L (ref 38–126)
Anion gap: 7 (ref 5–15)
BUN: 34 mg/dL — ABNORMAL HIGH (ref 8–23)
CO2: 30 mmol/L (ref 22–32)
Calcium: 9.2 mg/dL (ref 8.9–10.3)
Chloride: 103 mmol/L (ref 98–111)
Creatinine, Ser: 1.11 mg/dL — ABNORMAL HIGH (ref 0.44–1.00)
GFR, Estimated: 52 mL/min — ABNORMAL LOW (ref >60)
Glucose, Bld: 169 mg/dL — ABNORMAL HIGH (ref 70–99)
Potassium: 3.8 mmol/L (ref 3.5–5.1)
Sodium: 140 mmol/L (ref 135–145)
Total Bilirubin: 0.8 mg/dL (ref 0.3–1.2)
Total Protein: 6.2 g/dL — ABNORMAL LOW (ref 6.5–8.1)

## 2020-09-21 LAB — C-REACTIVE PROTEIN: CRP: 0.6 mg/dL (ref <1.0)

## 2020-09-21 LAB — GLUCOSE, CAPILLARY
Glucose-Capillary: 165 mg/dL — ABNORMAL HIGH (ref 70–99)
Glucose-Capillary: 169 mg/dL — ABNORMAL HIGH (ref 70–99)
Glucose-Capillary: 218 mg/dL — ABNORMAL HIGH (ref 70–99)
Glucose-Capillary: 183 mg/dL — ABNORMAL HIGH (ref 70–99)
Glucose-Capillary: 281 mg/dL — ABNORMAL HIGH (ref 70–99)
Glucose-Capillary: 111 mg/dL — ABNORMAL HIGH (ref 70–99)

## 2020-09-21 LAB — CBC
HCT: 32 % — ABNORMAL LOW (ref 36.0–46.0)
Hemoglobin: 9.7 g/dL — ABNORMAL LOW (ref 12.0–15.0)
MCH: 30 pg (ref 26.0–34.0)
MCHC: 30.3 g/dL (ref 30.0–36.0)
MCV: 99.1 fL (ref 80.0–100.0)
Platelets: 252 10*3/uL (ref 150–400)
RBC: 3.23 MIL/uL — ABNORMAL LOW (ref 3.87–5.11)
RDW: 11.8 % (ref 11.5–15.5)
WBC: 10.7 10*3/uL — ABNORMAL HIGH (ref 4.0–10.5)
nRBC: 0 % (ref 0.0–0.2)

## 2020-09-21 LAB — CULTURE, BLOOD (ROUTINE X 2): Culture: NO GROWTH

## 2020-09-21 LAB — D-DIMER, QUANTITATIVE: D-Dimer, Quant: 0.51 ug/mL-FEU — ABNORMAL HIGH (ref 0.00–0.50)

## 2020-09-21 NOTE — TOC Initial Note (Signed)
Transition of Care [facility]) - Initial/Assessment Note    Patient Details  Name: Suzanne Dickson MRN: [id number] Date of Birth: 11/23/1945  [facility] CM/SW Contact:    Carles Collet, RN Phone Number: 09/21/2020, 11:05 AM  Clinical Narrative:          Damaris Schooner w patient over the phone. She states her plan is to return home at DC. She is active w [employer] for home oxygen. She also has a rollator and shower seat at home. We discussed 3/1 and this does not appeal to her at this time. No DME needs expected for now. We discussed [place] providers and referral was accepted by [facility].           Expected Discharge Plan: [place] Barriers to Discharge: Continued Medical Work up   Patient Goals and CMS Choice Patient states their goals for this hospitalization and ongoing recovery are:: to return home CMS Medicare.gov Compare Post Acute Care list provided to:: Patient Choice offered to / list presented to : Patient  Expected Discharge Plan and Services Expected Discharge Plan: [place]   Discharge Planning Services: CM Consult Post Acute Care Choice: Home Health                             HH Arranged: PT, OT [facility] Agency: [place] Date [place]: 09/21/20 Time HH Agency Contacted: [id number] Representative spoke with at [place]: Tommi Rumps  Prior Living Arrangements/Services                  Current home services: DME    Activities of Daily Living      Permission Sought/Granted                  Emotional Assessment              Admission diagnosis:  COPD exacerbation ([place]) [J44.1] Acute on chronic respiratory failure with hypoxia ([place]) [J96.21] Pneumonia due to COVID-19 virus [U07.1, J12.82] COVID-19 [U07.1] Patient Active Problem List   Diagnosis Date Noted   Pneumonia due to COVID-19 virus 09/16/2020   Acute on chronic respiratory failure with hypoxia and hypercapnia ([place]) 09/16/2020    Hypokalemia 09/16/2020   Macrocytic anemia 09/16/2020   Abnormal EKG 09/16/2020   Abnormal CXR 09/16/2020   PCP:  [name], MD Pharmacy:   [place], [address] [place] [address] Phone: [phone] Fax: [phone]     Social Determinants of Health (SDOH) Interventions    Readmission Risk Interventions No flowsheet data found.

## 2020-09-21 NOTE — Progress Notes (Signed)
PROGRESS NOTE        PATIENT DETAILS Name: Suzanne Dickson Age: 75 y.o. Sex: female Date of Birth: [birth year] Admit Date: 09/15/2020 Admitting Physician Renee Pain, MD WUX:[id number], Prudencio Burly, MD  Brief Narrative: Patient is a 75 y.o. female with history of COPD (on 4-7 L of oxygen at home), HTN, CAD, PAH, adenocarcinoma of lung (remote history) who presented to the ED with shortness of breath-she was found to have acute hypoxic respiratory failure requiring BiPAP-in the setting of COPD exacerbation and COVID-19 pneumonia.  She was admitted to the [facility] upon stability-was transferred to [facility] on 8/24.  Significant events: 8/22>> shortness of breath-requiring BiPAP-to ICU- COPD exac/COVID-19 pneumonia 8/24>> transfer to [facility].  Significant studies: 8/22>> CXR: Bibasilar/groundglass opacities. 8/25>> CXR: No pneumothorax-continues to have subtle bibasilar opacities (personally reviewed)  COVID-19 specific treatment:  Remdesivir: 8/22>>8/27 Steroids: 8/22>>  Microbiology data: 8/22>> COVID PCR: Positive 8/22>> blood culture: No growth 8/23>> blood culture: No growth  Procedures : None  Consults:  PCCM  DVT Prophylaxis : heparin injection 5,000 Units Start: 09/17/20 0600 SCDs Start: 09/16/20 0035   Subjective: Patient mentions that she is feeling better compared to how she was a few days ago.  Continues to have difficulty breathing with exertion but overall she feels that she is improving.  Denies any chest pain.  Occasional cough.  No nausea or vomiting.    Assessment/Plan:  Acute on chronic hypoxic respiratory failure due to COPD exacerbation in the setting of COVID-19 pneumonia:  Patient seems to be improving from a COVID-19 standpoint.  She has been given 5 days of Remdesivir.  Remains on steroids.  CRP has been normal for the past 2 days.  D-dimer minimally elevated.  Currently noted to be on high flow nasal cannula at  3 to 4 L/min.  With exertion she tends to drop her saturations significantly requiring nonrebreather at times.  Continue incentive spirometry.  Continue to wean down steroids gradually.  Out of bed to chair. She has also received diuretics during this hospital stay.  Holding off on further doses for now.  Note-patient on chronic steroids at home-we will need to be tapered back down to her usual regimen.  Recent Labs    09/19/20 0216 09/20/20 0124 09/21/20 0211  DDIMER 0.66* 0.43 0.51*  CRP 1.1* 0.8 0.6    Acute kidney injury: Improved-AKI was likely hemodynamically mediated.  Macrocytic anemia: Continue to monitor hemoglobin-B12/folate levels are stable.    CAD: No anginal symptoms-PCI apparently done in 2019-continue antiplatelet agents/statin/beta-blocker.  Essential hypertension: BP stable-continue metoprolol/losartan and amlodipine.    Remote history of lung adenocarcinoma-s/p radiation therapy: Continue outpatient surveillance.  Obesity: Estimated body mass index is 31.17 kg/m as calculated from the following:   Height as of this encounter: 5\' 2"  (1.575 m).   Weight as of this encounter: 77.3 kg.   Diet: Diet Order             Diet heart healthy/carb modified Room service appropriate? Yes; Fluid consistency: Thin  Diet effective now                    Code Status: Full code   Family Communication: Spouse-Ronnie [name] [phone]   Disposition Plan: Status is: Inpatient  Remains inpatient appropriate because:Inpatient level of care appropriate due to severity of illness  Dispo: The patient is  from: Home              Anticipated d/c is to: Home              Patient currently is not medically stable to d/c.   Difficult to place patient No   Barriers to Discharge: Hypoxia-COVID-19-needs 5 days of Remdesivir-noted stable for discharge.  Still with significant exertional dyspnea requiring NRB.  Antimicrobial agents: Anti-infectives (From admission,  onward)    Start     Dose/Rate Route Frequency Ordered Stop   09/17/20 1000  remdesivir 100 mg in sodium chloride 0.9 % 100 mL IVPB       See Hyperspace for full Linked Orders Report.   100 mg 200 mL/hr over 30 Minutes Intravenous Daily 09/16/20 0048 09/20/20 1040   09/16/20 0200  remdesivir 200 mg in sodium chloride 0.9% 250 mL IVPB       See Hyperspace for full Linked Orders Report.   200 mg 580 mL/hr over 30 Minutes Intravenous Once 09/16/20 0048 09/16/20 0254         MEDICATIONS: Scheduled Meds:  amLODipine  5 mg Oral Daily   aspirin EC  81 mg Oral Daily   budesonide  1 puff Inhalation BID   Chlorhexidine Gluconate Cloth  6 each Topical Q0600   cholecalciferol  1,000 Units Oral Daily   clopidogrel  75 mg Oral Daily   ferrous sulfate  325 mg Oral Q1400   heparin  5,000 Units Subcutaneous Q8H   insulin aspart  0-20 Units Subcutaneous TID WC   losartan  50 mg Oral Daily   methylPREDNISolone (SOLU-MEDROL) injection  40 mg Intravenous Q12H   metoprolol succinate  25 mg Oral Daily   pantoprazole  40 mg Oral Daily   sertraline  100 mg Oral QHS   umeclidinium-vilanterol  1 puff Inhalation Daily   Continuous Infusions:   PRN Meds:.acetaminophen, albuterol, benzonatate, docusate sodium, guaiFENesin-dextromethorphan, Ipratropium-Albuterol, ondansetron (ZOFRAN) IV, polyethylene glycol   PHYSICAL EXAM: Vital signs: Vitals:   09/21/20 0000 09/21/20 0500 09/21/20 0519 09/21/20 0806  BP: (!) 164/68  (!) 176/81 (!) 167/87  Pulse: 72  87 76  Resp: 19  13 18   Temp: 97.9 F (36.6 C)  98.9 F (37.2 C) 98.1 F (36.7 C)  TempSrc: Oral  Oral Oral  SpO2: 94%  97% 97%  Weight:  77.3 kg    Height:       Filed Weights   09/17/20 0420 09/20/20 0500 09/21/20 0500  Weight: 80 kg 80.7 kg 77.3 kg   Body mass index is 31.17 kg/m.   General appearance: Awake alert.  In no distress Resp: Mildly tachypneic at rest.  Few crackles bilateral bases.  No wheezing or rhonchi. Cardio: S1-S2  is normal regular.  No S3-S4.  No rubs murmurs or bruit GI: Abdomen is soft.  Nontender nondistended.  Bowel sounds are present normal.  No masses organomegaly Extremities: No edema.  Full range of motion of lower extremities. Neurologic: Alert and oriented x3.  No focal neurological deficits.     I have personally reviewed following labs and imaging studies  LABORATORY DATA: CBC: Recent Labs  Lab 09/15/20 2106 09/15/20 2133 09/17/20 0751 09/18/20 0111 09/19/20 0216 09/20/20 0124 09/21/20 0211  WBC 9.9   < > 6.5 9.0 8.1 9.9 10.7*  NEUTROABS 6.6  --   --   --   --   --   --   HGB 9.8*   < > 9.5* 9.6* 10.0* 9.9* 9.7*  HCT 32.3*   < > 31.8* 31.5* 33.2* 32.4* 32.0*  MCV 102.5*   < > 100.6* 100.6* 100.9* 99.1 99.1  PLT 158   < > 150 174 182 193 252   < > = values in this interval not displayed.     Basic Metabolic Panel: Recent Labs  Lab 09/16/20 0429 09/16/20 0512 09/17/20 0751 09/18/20 0111 09/19/20 0216 09/20/20 0124 09/21/20 0211  NA 139   < > 141 141 141 140 140  K 4.5   < > 4.5 4.3 4.5 3.8 3.8  CL 107  --  105 106 105 101 103  CO2 22  --  30 30 28  33* 30  GLUCOSE 223*  --  205* 165* 258* 181* 169*  BUN 19  --  25* 25* 31* 30* 34*  CREATININE 1.15*  --  1.12* 1.03* 1.15* 1.12* 1.11*  CALCIUM 8.7*  --  8.9 8.8* 9.2 9.3 9.2  MG 2.1  --   --   --   --   --   --   PHOS 3.8  --   --   --   --   --   --    < > = values in this interval not displayed.     GFR: Estimated Creatinine Clearance: 42.8 mL/min (A) (by C-G formula based on SCr of 1.11 mg/dL (H)).  Liver Function Tests: Recent Labs  Lab 09/17/20 0751 09/18/20 0111 09/19/20 0216 09/20/20 0124 09/21/20 0211  AST 47* 44* 43* 25 22  ALT 35 38 50* 42 35  ALKPHOS 34* 36* 44 46 42  BILITOT 0.6 0.6 0.8 1.0 0.8  PROT 6.6 6.2* 6.4* 6.3* 6.2*  ALBUMIN 3.3* 3.1* 3.3* 3.3* 3.2*     Sepsis Labs: Lactic Acid, Venous    Component Value Date/Time   LATICACIDVEN 1.3 09/15/2020 2106     MICROBIOLOGY: Recent Results (from the past 240 hour(s))  Resp Panel by RT-PCR (Flu A&B, Covid) Nasopharyngeal Swab     Status: Abnormal   Collection Time: 09/15/20  9:04 PM   Specimen: Nasopharyngeal Swab; Nasopharyngeal(NP) swabs in vial transport medium  Result Value Ref Range Status   SARS Coronavirus 2 by RT PCR POSITIVE (A) NEGATIVE Final    Comment: RESULT CALLED TO, READ BACK BY AND VERIFIED WITH: K SIMMERSON,RN@[id number]  09/15/20 MKELLY (NOTE) SARS-CoV-2 target nucleic acids are DETECTED.  The SARS-CoV-2 RNA is generally detectable in upper respiratory specimens during the acute phase of infection. Positive results are indicative of the presence of the identified virus, but do not rule out bacterial infection or co-infection with other pathogens not detected by the test. Clinical correlation with patient history and other diagnostic information is necessary to determine patient infection status. The expected result is Negative.  Fact Sheet for Patients: EntrepreneurPulse.com.au  Fact Sheet for Healthcare Providers: IncredibleEmployment.be  This test is not yet approved or cleared by the [place] FDA and  has been authorized for detection and/or diagnosis of SARS-CoV-2 by FDA under an Emergency Use Authorization (EUA).  This EUA will remain in effect (meaning this test can  be used) for the duration of  the COVID-19 declaration under Section 564(b)(1) of the Act, 21 U.S.C. section 360bbb-3(b)(1), unless the authorization is terminated or revoked sooner.     Influenza A by PCR NEGATIVE NEGATIVE Final   Influenza B by PCR NEGATIVE NEGATIVE Final    Comment: (NOTE) The Xpert Xpress SARS-CoV-2/FLU/RSV plus assay is intended as an aid in the diagnosis of influenza from Nasopharyngeal  swab specimens and should not be used as a sole basis for treatment. Nasal washings and aspirates are unacceptable for Xpert Xpress  SARS-CoV-2/FLU/RSV testing.  Fact Sheet for Patients: EntrepreneurPulse.com.au  Fact Sheet for Healthcare Providers: IncredibleEmployment.be  This test is not yet approved or cleared by the [place] FDA and has been authorized for detection and/or diagnosis of SARS-CoV-2 by FDA under an Emergency Use Authorization (EUA). This EUA will remain in effect (meaning this test can be used) for the duration of the COVID-19 declaration under Section 564(b)(1) of the Act, 21 U.S.C. section 360bbb-3(b)(1), unless the authorization is terminated or revoked.  Performed at [facility], [place] [address] [place], [place] [address]   Blood Culture (routine x 2)     Status: None   Collection Time: 09/15/20  9:34 PM   Specimen: BLOOD LEFT HAND  Result Value Ref Range Status   Specimen Description BLOOD LEFT HAND  Final   Special Requests   Final    BOTTLES DRAWN AEROBIC AND ANAEROBIC Blood Culture adequate volume   Culture   Final    NO GROWTH 5 DAYS Performed at [facility], [place] [address], [place], [place] [address]    Report Status 09/20/2020 FINAL  Final  MRSA Next Gen by PCR, Nasal     Status: None   Collection Time: 09/16/20  2:47 AM   Specimen: Nasal Mucosa; Nasal Swab  Result Value Ref Range Status   MRSA by PCR Next Gen NOT DETECTED NOT DETECTED Final    Comment: (NOTE) The GeneXpert MRSA Assay (FDA approved for NASAL specimens only), is one component of a comprehensive MRSA colonization surveillance program. It is not intended to diagnose MRSA infection nor to guide or monitor treatment for MRSA infections. Test performance is not FDA approved in patients less than 55 years old. Performed at [facility], [place] [address] [place], [place] [address]   Blood Culture (routine x 2)     Status: None (Preliminary result)   Collection Time: 09/16/20  4:29 AM   Specimen: BLOOD  Result Value Ref Range Status   Specimen  Description BLOOD RIGHT ANTECUBITAL  Final   Special Requests   Final    BOTTLES DRAWN AEROBIC ONLY Blood Culture results may not be optimal due to an inadequate volume of blood received in culture bottles   Culture   Final    NO GROWTH 4 DAYS Performed at [facility], [place] [address], [place], [place] [address]    Report Status PENDING  Incomplete    RADIOLOGY STUDIES/RESULTS: No results found.   LOS: 5 days   Bonnielee Haff, MD  [facility]    To contact the attending provider between 7A-7P or the covering provider during after hours 7P-7A, please log into the web site [email or web address] and access using universal [place] password for that web site. If you do not have the password, please call the hospital operator.  09/21/2020, 11:01 AM

## 2020-09-22 DIAGNOSIS — J9622 Acute and chronic respiratory failure with hypercapnia: Secondary | ICD-10-CM | POA: Diagnosis not present

## 2020-09-22 DIAGNOSIS — J9621 Acute and chronic respiratory failure with hypoxia: Secondary | ICD-10-CM | POA: Diagnosis not present

## 2020-09-22 LAB — CBC
HCT: 34.5 % — ABNORMAL LOW (ref 36.0–46.0)
Hemoglobin: 10.6 g/dL — ABNORMAL LOW (ref 12.0–15.0)
MCH: 30.5 pg (ref 26.0–34.0)
MCHC: 30.7 g/dL (ref 30.0–36.0)
MCV: 99.4 fL (ref 80.0–100.0)
Platelets: 226 10*3/uL (ref 150–400)
RBC: 3.47 MIL/uL — ABNORMAL LOW (ref 3.87–5.11)
RDW: 12.1 % (ref 11.5–15.5)
WBC: 12 10*3/uL — ABNORMAL HIGH (ref 4.0–10.5)
nRBC: 0 % (ref 0.0–0.2)

## 2020-09-22 LAB — BASIC METABOLIC PANEL
Anion gap: 6 (ref 5–15)
BUN: 37 mg/dL — ABNORMAL HIGH (ref 8–23)
CO2: 31 mmol/L (ref 22–32)
Calcium: 9.3 mg/dL (ref 8.9–10.3)
Chloride: 101 mmol/L (ref 98–111)
Creatinine, Ser: 1.25 mg/dL — ABNORMAL HIGH (ref 0.44–1.00)
GFR, Estimated: 45 mL/min — ABNORMAL LOW (ref >60)
Glucose, Bld: 206 mg/dL — ABNORMAL HIGH (ref 70–99)
Potassium: 4.1 mmol/L (ref 3.5–5.1)
Sodium: 138 mmol/L (ref 135–145)

## 2020-09-22 LAB — GLUCOSE, CAPILLARY
Glucose-Capillary: 190 mg/dL — ABNORMAL HIGH (ref 70–99)
Glucose-Capillary: 204 mg/dL — ABNORMAL HIGH (ref 70–99)
Glucose-Capillary: 178 mg/dL — ABNORMAL HIGH (ref 70–99)

## 2020-09-22 MED ORDER — PREDNISONE 10 MG PO TABS: 10.0000 mg | ORAL_TABLET | ORAL | Status: DC

## 2020-09-22 MED ORDER — PREDNISONE 10 MG PO TABS: ORAL_TABLET | ORAL | 0 refills | Status: DC

## 2020-09-22 MED ORDER — BENZONATATE 200 MG PO CAPS: 200.0000 mg | ORAL_CAPSULE | Freq: Three times a day (TID) | ORAL | 0 refills | Status: DC | PRN

## 2020-09-22 MED ORDER — PREDNISONE 20 MG PO TABS: 60.0000 mg | ORAL_TABLET | Freq: Every day | ORAL | Status: DC

## 2020-09-22 MED ORDER — PREDNISONE 20 MG PO TABS
40.0000 mg | ORAL_TABLET | Freq: Once | ORAL | Status: AC
  Administered 2020-09-22: 40 mg via ORAL
  Filled 2020-09-22: qty 2

## 2020-09-22 NOTE — Progress Notes (Signed)
PCCM INTERVAL PROGRESS NOTE   On bedside rounds today Suzanne Dickson tells me she is being discharged and she feels back to baseline. I have offered her follow up in the pulmonary clinic. She tells me she has a routine visit scheduled for October and does not desire to be seen before then. She has the office information and will call with any issues to arrange follow up.     Suzanne Dickson, AGACNP-BC [facility]  See Amion for personal pager PCCM on call pager [phone] until 7pm. Please call Elink 7p-7a. [phone]  09/22/2020 3:25 PM

## 2020-09-22 NOTE — Discharge Summary (Signed)
[facility]  Physician Discharge Summary   Patient ID: Suzanne Dickson MRN: [id number] DOB/AGE: 1945-01-31 75 y.o.  Admit date: 09/15/2020 Discharge date:   09/22/2020   PCP: [name], MD  DISCHARGE DIAGNOSES:  Pneumonia due to COVID-19 Acute on chronic respiratory failure with hypoxia COPD with acute exacerbation Macrocytic anemia Coronary artery disease Essential hypertension   RECOMMENDATIONS FOR OUTPATIENT FOLLOW UP: Ambulatory referral sent to pulmonology for follow-up    Home Health: PT OT RN respiratory therapy and aide Equipment/Devices: Rolling walker  CODE STATUS: Full code  DISCHARGE CONDITION: fair  Diet recommendation: As before  INITIAL HISTORY: Patient is a 75 y.o. female with history of COPD (on 4-7 L of oxygen at home), HTN, CAD, PAH, adenocarcinoma of lung (remote history) who presented to the ED with shortness of breath-she was found to have acute hypoxic respiratory failure requiring BiPAP-in the setting of COPD exacerbation and COVID-19 pneumonia.  She was admitted to the [facility] upon stability-was transferred to the [facility] on 8/24.   Significant events: 8/22>> shortness of breath-requiring BiPAP-to ICU- COPD exac/COVID-19 pneumonia 8/24>> transfer to [facility].   Significant studies: 8/22>> CXR: Bibasilar/groundglass opacities. 8/25>> CXR: No pneumothorax-continues to have subtle bibasilar opacities (personally reviewed)   COVID-19 specific treatment:  Remdesivir: 8/22>>8/27 Steroids: 8/22>>   Microbiology data: 8/22>> COVID PCR: Positive 8/22>> blood culture: No growth 8/23>> blood culture: No growth   Procedures : None   Consults:  PCCM    HOSPITAL COURSE:   Acute on chronic hypoxic respiratory failure/COPD with acute exacerbation/Pneumonia due to COVID-19 She has completed 5-day course of Remdesivir.  CRP has been normal in the last 2 times it was checked.  Noted to be on 3 to 4 L of oxygen.  Uses  oxygen at home for her COPD.  Remains on higher dose of steroids here in the hospital. When seen by physical therapy last week she had a precipitous drop in her saturation levels.  Over the last 48 hours patient has improved.  Seen again by physical therapy and did much better with them.  She feels that she is back to her baseline.  Really wants to go home today.  Discussed with her husband.  Home health will be ordered.  Okay for discharge today.  Note-patient on chronic steroids at home-will be tapered back down to her usual regimen.   Acute kidney injury: Improved-AKI was likely hemodynamically mediated.   Macrocytic anemia: Hemoglobin remained stable.  B44 and folic acid levels were stable.   CAD: No anginal symptoms-PCI apparently done in 2019-continue antiplatelet agents/statin/beta-blocker.   Essential hypertension: BP stable-continue metoprolol/losartan and amlodipine.     Remote history of lung adenocarcinoma-s/p radiation therapy: Continue outpatient surveillance.   Obesity: Estimated body mass index is 31.17 kg/m as calculated from the following:   Height as of this encounter: 5\' 2"  (1.575 m).   Weight as of this encounter: 77.3 kg.    Patient is stable.  Okay for discharge home today.    PERTINENT LABS:  The results of significant diagnostics from this hospitalization (including imaging, microbiology, ancillary and laboratory) are listed below for reference.    Microbiology: Recent Results (from the past 240 hour(s))  Resp Panel by RT-PCR (Flu A&B, Covid) Nasopharyngeal Swab     Status: Abnormal   Collection Time: 09/15/20  9:04 PM   Specimen: Nasopharyngeal Swab; Nasopharyngeal(NP) swabs in vial transport medium  Result Value Ref Range Status   SARS Coronavirus 2 by RT PCR POSITIVE (A) NEGATIVE Final  Comment: RESULT CALLED TO, READ BACK BY AND VERIFIED WITH: K SIMMERSON,RN@[id number]  09/15/20 MKELLY (NOTE) SARS-CoV-2 target nucleic acids are DETECTED.  The SARS-CoV-2  RNA is generally detectable in upper respiratory specimens during the acute phase of infection. Positive results are indicative of the presence of the identified virus, but do not rule out bacterial infection or co-infection with other pathogens not detected by the test. Clinical correlation with patient history and other diagnostic information is necessary to determine patient infection status. The expected result is Negative.  Fact Sheet for Patients: EntrepreneurPulse.com.au  Fact Sheet for Healthcare Providers: IncredibleEmployment.be  This test is not yet approved or cleared by the [place] FDA and  has been authorized for detection and/or diagnosis of SARS-CoV-2 by FDA under an Emergency Use Authorization (EUA).  This EUA will remain in effect (meaning this test can  be used) for the duration of  the COVID-19 declaration under Section 564(b)(1) of the Act, 21 U.S.C. section 360bbb-3(b)(1), unless the authorization is terminated or revoked sooner.     Influenza A by PCR NEGATIVE NEGATIVE Final   Influenza B by PCR NEGATIVE NEGATIVE Final    Comment: (NOTE) The Xpert Xpress SARS-CoV-2/FLU/RSV plus assay is intended as an aid in the diagnosis of influenza from Nasopharyngeal swab specimens and should not be used as a sole basis for treatment. Nasal washings and aspirates are unacceptable for Xpert Xpress SARS-CoV-2/FLU/RSV testing.  Fact Sheet for Patients: EntrepreneurPulse.com.au  Fact Sheet for Healthcare Providers: IncredibleEmployment.be  This test is not yet approved or cleared by the [place] FDA and has been authorized for detection and/or diagnosis of SARS-CoV-2 by FDA under an Emergency Use Authorization (EUA). This EUA will remain in effect (meaning this test can be used) for the duration of the COVID-19 declaration under Section 564(b)(1) of the Act, 21 U.S.C. section  360bbb-3(b)(1), unless the authorization is terminated or revoked.  Performed at [facility], [place] [address], [place], [place] [address]   Blood Culture (routine x 2)     Status: None   Collection Time: 09/15/20  9:34 PM   Specimen: BLOOD LEFT HAND  Result Value Ref Range Status   Specimen Description BLOOD LEFT HAND  Final   Special Requests   Final    BOTTLES DRAWN AEROBIC AND ANAEROBIC Blood Culture adequate volume   Culture   Final    NO GROWTH 5 DAYS Performed at [facility], [place] [address] [place], [place] [address]    Report Status 09/20/2020 FINAL  Final  MRSA Next Gen by PCR, Nasal     Status: None   Collection Time: 09/16/20  2:47 AM   Specimen: Nasal Mucosa; Nasal Swab  Result Value Ref Range Status   MRSA by PCR Next Gen NOT DETECTED NOT DETECTED Final    Comment: (NOTE) The GeneXpert MRSA Assay (FDA approved for NASAL specimens only), is one component of a comprehensive MRSA colonization surveillance program. It is not intended to diagnose MRSA infection nor to guide or monitor treatment for MRSA infections. Test performance is not FDA approved in patients less than 55 years old. Performed at [facility], [place] [address] [place], [place] [address]   Blood Culture (routine x 2)     Status: None   Collection Time: 09/16/20  4:29 AM   Specimen: BLOOD  Result Value Ref Range Status   Specimen Description BLOOD RIGHT ANTECUBITAL  Final   Special Requests   Final    BOTTLES DRAWN AEROBIC  ONLY Blood Culture results may not be optimal due to an inadequate volume of blood received in culture bottles   Culture   Final    NO GROWTH 5 DAYS Performed at [facility] [address] [place], [place] [address]    Report Status 09/21/2020 FINAL  Final     Labs:  COVID-19 Labs  Recent Labs    09/20/20 0124 09/21/20 0211  DDIMER 0.43 0.51*  CRP 0.8 0.6    Lab Results  Component Value Date   SARSCOV2NAA POSITIVE (A) 09/15/2020       Basic Metabolic Panel: Recent Labs  Lab 09/16/20 0429 09/16/20 0512 09/18/20 0111 09/19/20 0216 09/20/20 0124 09/21/20 0211 09/22/20 0802  NA 139   < > 141 141 140 140 138  K 4.5   < > 4.3 4.5 3.8 3.8 4.1  CL 107   < > 106 105 101 103 101  CO2 22   < > 30 28 33* 30 31  GLUCOSE 223*   < > 165* 258* 181* 169* 206*  BUN 19   < > 25* 31* 30* 34* 37*  CREATININE 1.15*   < > 1.03* 1.15* 1.12* 1.11* 1.25*  CALCIUM 8.7*   < > 8.8* 9.2 9.3 9.2 9.3  MG 2.1  --   --   --   --   --   --   PHOS 3.8  --   --   --   --   --   --    < > = values in this interval not displayed.   Liver Function Tests: Recent Labs  Lab 09/17/20 0751 09/18/20 0111 09/19/20 0216 09/20/20 0124 09/21/20 0211  AST 47* 44* 43* 25 22  ALT 35 38 50* 42 35  ALKPHOS 34* 36* 44 46 42  BILITOT 0.6 0.6 0.8 1.0 0.8  PROT 6.6 6.2* 6.4* 6.3* 6.2*  ALBUMIN 3.3* 3.1* 3.3* 3.3* 3.2*    CBC: Recent Labs  Lab 09/15/20 2106 09/15/20 2133 09/18/20 0111 09/19/20 0216 09/20/20 0124 09/21/20 0211 09/22/20 0802  WBC 9.9   < > 9.0 8.1 9.9 10.7* 12.0*  NEUTROABS 6.6  --   --   --   --   --   --   HGB 9.8*   < > 9.6* 10.0* 9.9* 9.7* 10.6*  HCT 32.3*   < > 31.5* 33.2* 32.4* 32.0* 34.5*  MCV 102.5*   < > 100.6* 100.9* 99.1 99.1 99.4  PLT 158   < > 174 182 193 252 226   < > = values in this interval not displayed.    BNP: BNP (last 3 results) Recent Labs    09/17/20 0751 09/19/20 0216  BNP 206.1* 109.0*     CBG: Recent Labs  Lab 09/21/20 1237 09/21/20 1714 09/21/20 2235 09/22/20 0728 09/22/20 1241  GLUCAP 183* 281* 111* 190* 204*     IMAGING STUDIES DG Chest Port 1 View  Result Date: 09/17/2020 CLINICAL DATA:  Shortness of breath.  Cough. EXAM: PORTABLE CHEST 1 VIEW COMPARISON:  September 15, 2020. FINDINGS: Stable probable posttreatment or postsurgical changes are seen involving the left hilum. Stable cardiomediastinal silhouette. No pneumothorax or pleural effusion is noted. Right lung is  clear. No definite acute abnormality is noted. Bony thorax is unremarkable. IMPRESSION: Stable probable postsurgical or posttreatment changes involving the left hilum. No definite acute abnormality is noted. Aortic Atherosclerosis (ICD10-I70.0). Electronically Signed   By: Marijo Conception M.D.   On: 09/17/2020 08:47  DG Chest Port 1 View  Result Date: 09/15/2020 CLINICAL DATA:  History of lung cancer, COVID-19 EXAM: PORTABLE CHEST 1 VIEW COMPARISON:  12/22/2018, 03/12/2020 FINDINGS: Single frontal view of the chest demonstrates post therapeutic changes at the left hilum, stable. There has been interval development of interstitial and ground-glass opacities at the lung bases, left greater than right, compatible with infection given history of COVID-19. No effusion or pneumothorax. Background emphysema again noted. IMPRESSION: 1. Bibasilar interstitial and ground-glass opacities consistent with atypical pneumonia such as COVID-19. 2. Stable post therapeutic changes at the left hilum, compatible with previous history of lung cancer. Electronically Signed   By: Randa Ngo M.D.   On: 09/15/2020 21:39    DISCHARGE EXAMINATION: Vitals:   09/21/20 1715 09/21/20 2030 09/22/20 0732 09/22/20 1246  BP: 140/60 (!) 155/83 (!) 168/68 (!) 133/57  Pulse: 67 90 68 81  Resp: 20 (!) 22 19 17   Temp: 97.6 F (36.4 C) 97.6 F (36.4 C) 97.7 F (36.5 C) 98 F (36.7 C)  TempSrc: Oral Oral Oral Oral  SpO2: 94% 93% 98% 93%  Weight:      Height:       General appearance: Awake alert.  In no distress Resp: Clear to auscultation bilaterally.  Normal effort Cardio: S1-S2 is normal regular.  No S3-S4.  No rubs murmurs or bruit GI: Abdomen is soft.  Nontender nondistended.  Bowel sounds are present normal.  No masses organomegaly   DISPOSITION: Home  Discharge Instructions     Ambulatory referral to Pulmonology   Complete by: As directed    pneumonia due to covid 19   Reason for referral: Asthma/COPD   Call  MD for:  difficulty breathing, headache or visual disturbances   Complete by: As directed    Call MD for:  extreme fatigue   Complete by: As directed    Call MD for:  persistant dizziness or light-headedness   Complete by: As directed    Call MD for:  persistant nausea and vomiting   Complete by: As directed    Call MD for:  severe uncontrolled pain   Complete by: As directed    Call MD for:  temperature >100.4   Complete by: As directed    Diet - low sodium heart healthy   Complete by: As directed    Discharge instructions   Complete by: As directed    Please take your medications as prescribed.  Please be sure to follow-up with your PCP.  Seek attention if your symptoms worsen.  You were cared for by a hospitalist during your hospital stay. If you have any questions about your discharge medications or the care you received while you were in the hospital after you are discharged, you can call the unit and asked to speak with the hospitalist on call if the hospitalist that took care of you is not available. Once you are discharged, your primary care physician will handle any further medical issues. Please note that NO REFILLS for any discharge medications will be authorized once you are discharged, as it is imperative that you return to your primary care physician (or establish a relationship with a primary care physician if you do not have one) for your aftercare needs so that they can reassess your need for medications and monitor your lab values. If you do not have a primary care physician, you can call [phone] for a physician referral.   Increase activity slowly   Complete by: As directed  Allergies as of 09/22/2020   No Known Allergies      Medication List     STOP taking these medications    omeprazole 20 MG capsule Commonly known as: PRILOSEC       TAKE these medications    acetaminophen 500 MG tablet Commonly known as: TYLENOL Take 500 mg by mouth every  6 (six) hours as needed.   albuterol 0.63 MG/3ML nebulizer solution Commonly known as: ACCUNEB Take 1 ampule by nebulization every 6 (six) hours as needed for wheezing.   Albuterol Sulfate 108 (90 Base) MCG/ACT Aepb Commonly known as: PROAIR RESPICLICK Inhale into the lungs.   amLODipine 5 MG tablet Commonly known as: NORVASC Take 5 mg by mouth daily.   aspirin EC 81 MG tablet Take 81 mg by mouth daily. Swallow whole.   benzonatate 200 MG capsule Commonly known as: TESSALON Take 1 capsule (200 mg total) by mouth 3 (three) times daily as needed for cough.   cholecalciferol 25 MCG (1000 UNIT) tablet Commonly known as: VITAMIN D Take 1,000 Units by mouth daily.   clopidogrel 75 MG tablet Commonly known as: PLAVIX Take 75 mg by mouth daily.   FERROUS SULFATE PO Take 3 tablets by mouth daily at 2 PM. Unsure of strength   hydrochlorothiazide 12.5 MG capsule Commonly known as: MICROZIDE Take 12.5 mg by mouth daily.   losartan 50 MG tablet Commonly known as: COZAAR Take 50 mg by mouth daily.   metoprolol succinate 25 MG 24 hr tablet Commonly known as: TOPROL-XL Take 25 mg by mouth daily.   OVER THE COUNTER MEDICATION Take 3,000 mcg by mouth daily. Vitamin B12 3097mcg tablet daily   OXYGEN Inhale 6 L into the lungs continuous.   pantoprazole 40 MG tablet Commonly known as: PROTONIX Take 40 mg by mouth daily.   predniSONE 10 MG tablet Commonly known as: DELTASONE Take 6 tablets once daily for 5 days followed by 4 tablets once daily for 5 days followed by 2 tablets once daily as before. What changed: You were already taking a medication with the same name, and this prescription was added. Make sure you understand how and when to take each.   predniSONE 10 MG tablet Commonly known as: DELTASONE Take 1-2 tablets (10-20 mg total) by mouth See admin instructions. Take 2 tabs daily until feeling better then 1 tab daily.  PLEASE RESUME ONLY AFTER THE OTHER COURSE OF  PREDNISONE HAS BEEN COMPLETED. What changed: additional instructions   sertraline 100 MG tablet Commonly known as: ZOLOFT Take 100 mg by mouth daily.   Trelegy Ellipta 100-62.5-25 MCG/INH Aepb Generic drug: Fluticasone-Umeclidin-Vilant Inhale 1 puff into the lungs daily.          Follow-up Information     [facility] Follow up.   Specialty: Home Health Services Why: For home health services. They will call you in 1-2 days to set up your first home appointmnet Contact information: [place] [address] [phone]         [name], MD Follow up in 1 week(s).   Specialty: Cardiology Contact information: [place] [address] [phone]                 TOTAL DISCHARGE TIME: 75 Minutes  [place]  [facility] operational officer on [email or web address]  09/22/2020, 3:09 PM

## 2020-09-22 NOTE — Progress Notes (Signed)
PROGRESS NOTE        PATIENT DETAILS Name: Suzanne Dickson Age: 75 y.o. Sex: female Date of Birth: Jan 16, 1946 Admit Date: 09/15/2020 Admitting Physician Renee Pain, MD BZM:[id number], Prudencio Burly, MD  Brief Narrative: Patient is a 75 y.o. female with history of COPD (on 4-7 L of oxygen at home), HTN, CAD, PAH, adenocarcinoma of lung (remote history) who presented to the ED with shortness of breath-she was found to have acute hypoxic respiratory failure requiring BiPAP-in the setting of COPD exacerbation and COVID-19 pneumonia.  She was admitted to the [facility] upon stability-was transferred to the [facility] on 8/24.  Significant events: 8/22>> shortness of breath-requiring BiPAP-to ICU- COPD exac/COVID-19 pneumonia 8/24>> transfer to [facility].  Significant studies: 8/22>> CXR: Bibasilar/groundglass opacities. 8/25>> CXR: No pneumothorax-continues to have subtle bibasilar opacities (personally reviewed)  COVID-19 specific treatment:  Remdesivir: 8/22>>8/27 Steroids: 8/22>>  Microbiology data: 8/22>> COVID PCR: Positive 8/22>> blood culture: No growth 8/23>> blood culture: No growth  Procedures : None  Consults:  PCCM  DVT Prophylaxis : heparin injection 5,000 Units Start: 09/17/20 0600 SCDs Start: 09/16/20 0035   Subjective: Patient mentions that she is feeling better this morning.  Looking forward to going home soon.  Shortness of breath is improved.  Does not have any significant cough.  No chest pain.    Assessment/Plan:  Acute on chronic hypoxic respiratory failure due to COPD exacerbation in the setting of COVID-19 pneumonia:  Appears to be improving from a COVID-19 standpoint.  She has completed 5-day course of Remdesivir.  CRP has been normal in the last 2 times it was checked.  Noted to be on 3 to 4 L of oxygen.  Uses oxygen at home for her COPD. When seen by physical therapy last week she had a precipitous drop in her saturation  levels.  We will wait for PT to reevaluate her.  If she does better she could potentially go home later today or tomorrow.  Change her to oral steroids.  She also received diuretics during this hospital stay.  Note-patient on chronic steroids at home-we will need to be tapered back down to her usual regimen.  Recent Labs    09/20/20 0124 09/21/20 0211  DDIMER 0.43 0.51*  CRP 0.8 0.6    Acute kidney injury: Improved-AKI was likely hemodynamically mediated.  Macrocytic anemia: Hemoglobin remained stable.  K12 and folic acid levels were stable.  CAD: No anginal symptoms-PCI apparently done in 2019-continue antiplatelet agents/statin/beta-blocker.  Essential hypertension: BP stable-continue metoprolol/losartan and amlodipine.    Remote history of lung adenocarcinoma-s/p radiation therapy: Continue outpatient surveillance.  Obesity: Estimated body mass index is 31.17 kg/m as calculated from the following:   Height as of this encounter: 5\' 2"  (1.575 m).   Weight as of this encounter: 77.3 kg.   Diet: Diet Order             Diet heart healthy/carb modified Room service appropriate? Yes; Fluid consistency: Thin  Diet effective now                    Code Status: Full code   Family Communication: Spouse-Ronnie Ringenberg [phone]   Disposition Plan: Status is: Inpatient  Remains inpatient appropriate because:Inpatient level of care appropriate due to severity of illness  Dispo: The patient is from: Home  Anticipated d/c is to: Home              Patient currently is not medically stable to d/c.   Difficult to place patient No   Barriers to Discharge: Hypoxia-COVID-19-needs 5 days of Remdesivir-noted stable for discharge.  Still with significant exertional dyspnea requiring NRB.  Antimicrobial agents: Anti-infectives (From admission, onward)    Start     Dose/Rate Route Frequency Ordered Stop   09/17/20 1000  remdesivir 100 mg in sodium chloride 0.9 %  100 mL IVPB       See Hyperspace for full Linked Orders Report.   100 mg 200 mL/hr over 30 Minutes Intravenous Daily 09/16/20 0048 09/20/20 1040   09/16/20 0200  remdesivir 200 mg in sodium chloride 0.9% 250 mL IVPB       See Hyperspace for full Linked Orders Report.   200 mg 580 mL/hr over 30 Minutes Intravenous Once 09/16/20 0048 09/16/20 0254         MEDICATIONS: Scheduled Meds:  amLODipine  5 mg Oral Daily   aspirin EC  81 mg Oral Daily   budesonide  1 puff Inhalation BID   Chlorhexidine Gluconate Cloth  6 each Topical Q0600   cholecalciferol  1,000 Units Oral Daily   clopidogrel  75 mg Oral Daily   ferrous sulfate  325 mg Oral Q1400   heparin  5,000 Units Subcutaneous Q8H   insulin aspart  0-20 Units Subcutaneous TID WC   losartan  50 mg Oral Daily   methylPREDNISolone (SOLU-MEDROL) injection  40 mg Intravenous Q12H   metoprolol succinate  25 mg Oral Daily   pantoprazole  40 mg Oral Daily   sertraline  100 mg Oral QHS   umeclidinium-vilanterol  1 puff Inhalation Daily   Continuous Infusions:   PRN Meds:.acetaminophen, albuterol, benzonatate, docusate sodium, guaiFENesin-dextromethorphan, Ipratropium-Albuterol, ondansetron (ZOFRAN) IV, polyethylene glycol   PHYSICAL EXAM: Vital signs: Vitals:   09/21/20 1236 09/21/20 1715 09/21/20 2030 09/22/20 0732  BP: (!) 154/72 140/60 (!) 155/83 (!) 168/68  Pulse: 79 67 90 68  Resp: 20 20 (!) 22 19  Temp: 98.6 F (37 C) 97.6 F (36.4 C) 97.6 F (36.4 C) 97.7 F (36.5 C)  TempSrc: Axillary Oral Oral Oral  SpO2: 90% 94% 93% 98%  Weight:      Height:       Filed Weights   09/17/20 0420 09/20/20 0500 09/21/20 0500  Weight: 80 kg 80.7 kg 77.3 kg   Body mass index is 31.17 kg/m.   General appearance: Awake alert.  In no distress Resp: Improved effort at rest.  Not noted to be as tachypneic as before.  Few crackles bilateral bases.  No wheezing or rhonchi. Cardio: S1-S2 is normal regular.  No S3-S4.  No rubs murmurs or  bruit GI: Abdomen is soft.  Nontender nondistended.  Bowel sounds are present normal.  No masses organomegaly Extremities: No edema.  Moving all of her extremities Neurologic: Alert and oriented x3.  No focal neurological deficits.      I have personally reviewed following labs and imaging studies  LABORATORY DATA: CBC: Recent Labs  Lab 09/15/20 2106 09/15/20 2133 09/18/20 0111 09/19/20 0216 09/20/20 0124 09/21/20 0211 09/22/20 0802  WBC 9.9   < > 9.0 8.1 9.9 10.7* 12.0*  NEUTROABS 6.6  --   --   --   --   --   --   HGB 9.8*   < > 9.6* 10.0* 9.9* 9.7* 10.6*  HCT 32.3*   < >  31.5* 33.2* 32.4* 32.0* 34.5*  MCV 102.5*   < > 100.6* 100.9* 99.1 99.1 99.4  PLT 158   < > 174 182 193 252 226   < > = values in this interval not displayed.     Basic Metabolic Panel: Recent Labs  Lab 09/16/20 0429 09/16/20 0512 09/18/20 0111 09/19/20 0216 09/20/20 0124 09/21/20 0211 09/22/20 0802  NA 139   < > 141 141 140 140 138  K 4.5   < > 4.3 4.5 3.8 3.8 4.1  CL 107   < > 106 105 101 103 101  CO2 22   < > 30 28 33* 30 31  GLUCOSE 223*   < > 165* 258* 181* 169* 206*  BUN 19   < > 25* 31* 30* 34* 37*  CREATININE 1.15*   < > 1.03* 1.15* 1.12* 1.11* 1.25*  CALCIUM 8.7*   < > 8.8* 9.2 9.3 9.2 9.3  MG 2.1  --   --   --   --   --   --   PHOS 3.8  --   --   --   --   --   --    < > = values in this interval not displayed.     GFR: Estimated Creatinine Clearance: 38 mL/min (A) (by C-G formula based on SCr of 1.25 mg/dL (H)).  Liver Function Tests: Recent Labs  Lab 09/17/20 0751 09/18/20 0111 09/19/20 0216 09/20/20 0124 09/21/20 0211  AST 47* 44* 43* 25 22  ALT 35 38 50* 42 35  ALKPHOS 34* 36* 44 46 42  BILITOT 0.6 0.6 0.8 1.0 0.8  PROT 6.6 6.2* 6.4* 6.3* 6.2*  ALBUMIN 3.3* 3.1* 3.3* 3.3* 3.2*     Sepsis Labs: Lactic Acid, Venous    Component Value Date/Time   LATICACIDVEN 1.3 09/15/2020 2106    MICROBIOLOGY: Recent Results (from the past 240 hour(s))  Resp Panel by  RT-PCR (Flu A&B, Covid) Nasopharyngeal Swab     Status: Abnormal   Collection Time: 09/15/20  9:04 PM   Specimen: Nasopharyngeal Swab; Nasopharyngeal(NP) swabs in vial transport medium  Result Value Ref Range Status   SARS Coronavirus 2 by RT PCR POSITIVE (A) NEGATIVE Final    Comment: RESULT CALLED TO, READ BACK BY AND VERIFIED WITH: K SIMMERSON,RN@[id number]  09/15/20 MKELLY (NOTE) SARS-CoV-2 target nucleic acids are DETECTED.  The SARS-CoV-2 RNA is generally detectable in upper respiratory specimens during the acute phase of infection. Positive results are indicative of the presence of the identified virus, but do not rule out bacterial infection or co-infection with other pathogens not detected by the test. Clinical correlation with patient history and other diagnostic information is necessary to determine patient infection status. The expected result is Negative.  Fact Sheet for Patients: EntrepreneurPulse.com.au  Fact Sheet for Healthcare Providers: IncredibleEmployment.be  This test is not yet approved or cleared by the [place] FDA and  has been authorized for detection and/or diagnosis of SARS-CoV-2 by FDA under an Emergency Use Authorization (EUA).  This EUA will remain in effect (meaning this test can  be used) for the duration of  the COVID-19 declaration under Section 564(b)(1) of the Act, 21 U.S.C. section 360bbb-3(b)(1), unless the authorization is terminated or revoked sooner.     Influenza A by PCR NEGATIVE NEGATIVE Final   Influenza B by PCR NEGATIVE NEGATIVE Final    Comment: (NOTE) The Xpert Xpress SARS-CoV-2/FLU/RSV plus assay is intended as an aid in the diagnosis of influenza from Nasopharyngeal  swab specimens and should not be used as a sole basis for treatment. Nasal washings and aspirates are unacceptable for Xpert Xpress SARS-CoV-2/FLU/RSV testing.  Fact Sheet for  Patients: EntrepreneurPulse.com.au  Fact Sheet for Healthcare Providers: IncredibleEmployment.be  This test is not yet approved or cleared by the [place] FDA and has been authorized for detection and/or diagnosis of SARS-CoV-2 by FDA under an Emergency Use Authorization (EUA). This EUA will remain in effect (meaning this test can be used) for the duration of the COVID-19 declaration under Section 564(b)(1) of the Act, 21 U.S.C. section 360bbb-3(b)(1), unless the authorization is terminated or revoked.  Performed at [facility], [place] [address] [place], [place] [address]   Blood Culture (routine x 2)     Status: None   Collection Time: 09/15/20  9:34 PM   Specimen: BLOOD LEFT HAND  Result Value Ref Range Status   Specimen Description BLOOD LEFT HAND  Final   Special Requests   Final    BOTTLES DRAWN AEROBIC AND ANAEROBIC Blood Culture adequate volume   Culture   Final    NO GROWTH 5 DAYS Performed at [facility], [place] [address], [place], [place] [address]    Report Status 09/20/2020 FINAL  Final  MRSA Next Gen by PCR, Nasal     Status: None   Collection Time: 09/16/20  2:47 AM   Specimen: Nasal Mucosa; Nasal Swab  Result Value Ref Range Status   MRSA by PCR Next Gen NOT DETECTED NOT DETECTED Final    Comment: (NOTE) The GeneXpert MRSA Assay (FDA approved for NASAL specimens only), is one component of a comprehensive MRSA colonization surveillance program. It is not intended to diagnose MRSA infection nor to guide or monitor treatment for MRSA infections. Test performance is not FDA approved in patients less than 33 years old. Performed at [facility], [place] [address] [place], [place] [address]   Blood Culture (routine x 2)     Status: None   Collection Time: 09/16/20  4:29 AM   Specimen: BLOOD  Result Value Ref Range Status   Specimen Description BLOOD RIGHT ANTECUBITAL  Final   Special Requests   Final     BOTTLES DRAWN AEROBIC ONLY Blood Culture results may not be optimal due to an inadequate volume of blood received in culture bottles   Culture   Final    NO GROWTH 5 DAYS Performed at [facility], [place] [address], [place], [place] [address]    Report Status 09/21/2020 FINAL  Final    RADIOLOGY STUDIES/RESULTS: No results found.   LOS: 6 days   Bonnielee Haff, MD  [facility]    To contact the attending provider between 7A-7P or the covering provider during after hours 7P-7A, please log into the web site [email or web address] and access using universal [place] password for that web site. If you do not have the password, please call the hospital operator.  09/22/2020, 12:02 PM

## 2020-09-22 NOTE — Progress Notes (Signed)
Physical Therapy Treatment Patient Details Name: Suzanne Dickson MRN: [id number] DOB: Nov 22, 1945 Today's Date: 09/22/2020    History of Present Illness Pt is a 75 year old woman admitted on 8/23/2 with acute on chronic respiratory failure and COVID 19. Required bipap. PMH: COPD on 4-7L depending on activity, HTN, CAD, remote lung ca, PAH, obesity.    PT Comments    Patient tolerated session well with sats 88-91% with oxygen 4L at rest and 6L with activity (as pt states she does at home). Patient did well with limiting her talking to maintain her sats, including while walking and exercising.     Follow Up Recommendations  Home health PT     Equipment Recommendations  Rolling walker with 5" wheels    Recommendations for Other Services       Precautions / Restrictions Precautions Precautions: Fall;Other (comment) Precaution Comments: watch vitals    Mobility  Bed Mobility Overal bed mobility: Modified Independent             General bed mobility comments: up in recliner (HOB elevated)    Transfers Overall transfer level: Needs assistance Equipment used: Rolling walker (2 wheeled) Transfers: Sit to/from Stand Sit to Stand: Supervision Stand pivot transfers: Supervision       General transfer comment: 4L [place]  Ambulation/Gait Ambulation/Gait assistance: Min guard Gait Distance (Feet): 35 Feet Assistive device: Rolling walker (2 wheeled);1 person hand held assist Gait Pattern/deviations: Step-through pattern;Step-to pattern;Decreased stride length;Trunk flexed;Wide base of support Gait velocity: reduced   General Gait Details: on 6L sats 88-89%; standing rest break after 17 ft   Stairs             Wheelchair Mobility    Modified Rankin (Stroke Patients Only)       Balance Overall balance assessment: Needs assistance Sitting-balance support: No upper extremity supported;Feet supported Sitting balance-Leahy Scale: Good Sitting balance - Comments:   (O2 sats in upper 80s when sitting)   Standing balance support: No upper extremity supported Standing balance-Leahy Scale: Fair Standing balance comment: static stand without UE support                            Cognition Arousal/Alertness: Awake/alert Behavior During Therapy: WFL for tasks assessed/performed Overall Cognitive Status: Within Functional Limits for tasks assessed                                 General Comments: Becomes anxious following endurance exercises in standing      Exercises General Exercises - Lower Extremity Ankle Circles/Pumps: AROM;Both;20 reps;Seated Long Arc Quad: AROM;Right;Left;10 reps;Seated Hip Flexion/Marching: AROM;Right;Left;10 reps;Seated Other Exercises Other Exercises: sit to stand 5 reps x 2 sets    General Comments General comments (skin integrity, edema, etc.): on 4L HFNC at rest; reports at home she uses 4L at rest and 6L for activity. Incr to 6L for activity      Pertinent Vitals/Pain Pain Assessment: No/denies pain    Home Living Family/patient expects to be discharged to:: Private residence Living Arrangements: Spouse/significant other;Children Available Help at Discharge: Family;Available 24 hours/day Type of Home: House Home Access: Stairs to enter Entrance Stairs-Rails: Right;Left Home Layout: Two level;Able to live on main level with bedroom/bathroom Home Equipment: Walker - 4 wheels;Other (comment);Shower seat (shower chair)      Prior Function Level of Independence: Needs assistance  Gait / Transfers Assistance Needed: RW or no  AD ADL's / Homemaking Assistance Needed: I to do dressing and bathing but help with house     PT Goals (current goals can now be found in the care plan section) Acute Rehab PT Goals Patient Stated Goal: home PT Goal Formulation: With patient Time For Goal Achievement: 10/02/20 Potential to Achieve Goals: Good Progress towards PT goals: Progressing toward  goals    Frequency    Min 3X/week      PT Plan Current plan remains appropriate    Co-evaluation              AM-PAC PT "6 Clicks" Mobility   Outcome Measure  Help needed turning from your back to your side while in a flat bed without using bedrails?: A Little Help needed moving from lying on your back to sitting on the side of a flat bed without using bedrails?: A Little Help needed moving to and from a bed to a chair (including a wheelchair)?: A Little Help needed standing up from a chair using your arms (e.g., wheelchair or bedside chair)?: A Little Help needed to walk in hospital room?: A Little Help needed climbing 3-5 steps with a railing? : A Lot 6 Click Score: 17    End of Session Equipment Utilized During Treatment: Oxygen Activity Tolerance: Patient limited by fatigue Patient left: in chair;with call bell/phone within reach   PT Visit Diagnosis: Unsteadiness on feet (R26.81);Muscle weakness (generalized) (M62.81);Dizziness and giddiness (R42);Adult, failure to thrive (R62.7);Difficulty in walking, not elsewhere classified (R26.2)     Time: 6599-3570 PT Time Calculation (min) (ACUTE ONLY): 31 min  Charges:  $Gait Training: 8-22 mins $Therapeutic Exercise: 8-22 mins                      Arby Barrette, PT Pager [phone]    Rexanne Mano 09/22/2020, 2:35 PM

## 2020-09-22 NOTE — TOC Transition Note (Addendum)
Transition of Care [facility]) - CM/SW Discharge Note   Patient Details  Name: SAIDAH KEMPTON MRN: [id number] Date of Birth: Oct 07, 1945  Transition of Care [facility]) CM/SW Contact:  Zenon Mayo, RN Phone Number: 09/22/2020, 3:20 PM   Clinical Narrative:    Patient is for discharge today, NCM notified [facility] with [facility].  Patient states she has transport home today.   NCM notified Tommi Rumps  with [facility] to add an aide, PT, OT, RN and resp care.   Final next level of care: [place] Barriers to Discharge: No Barriers Identified   Patient Goals and CMS Choice Patient states their goals for this hospitalization and ongoing recovery are:: return home with [facility] CMS Medicare.gov Compare Post Acute Care list provided to:: Patient Choice offered to / list presented to : Patient  Discharge Placement                       Discharge Plan and Services   Discharge Planning Services: CM Consult Post Acute Care Choice: Home Health                    HH Arranged: PT, OT [facility] Agency: [place] Date [place]: 09/21/20 Time [employer]: [id number] Representative spoke with at [place]: [place] Interventions     Readmission Risk Interventions No flowsheet data found.

## 2020-09-22 NOTE — Progress Notes (Signed)
Occupational Therapy Treatment Patient Details Name: Suzanne Dickson MRN: [id number] DOB: 12-18-1945 Today's Date: 09/22/2020    History of present illness Pt is a 75 year old woman admitted on 8/23/2 with acute on chronic respiratory failure and COVID 19. Required bipap. PMH: COPD on 4-7L depending on activity, HTN, CAD, remote lung ca, PAH, obesity.   OT comments  Patient continues on 4 liters of O2.  Patient was supervision for bed mobility and transfer to recliner with RW.  Patient performed grooming seated in recliner with setup and extra time due to fatigue and O2 remained in upper 80s to low 90s without need of non rebreather.  Standing performed with patient tolerating 1.5 to 2 minutes of standing and with need of non rebreather to recover at rest.  Acute OT to continue to follow.   Follow Up Recommendations  Home health OT    Equipment Recommendations  None recommended by OT    Recommendations for Other Services      Precautions / Restrictions Precautions Precautions: Fall;Other (comment) Precaution Comments: watch vitals       Mobility Bed Mobility Overal bed mobility: Modified Independent             General bed mobility comments:  (HOB elevated)    Transfers Overall transfer level: Needs assistance Equipment used: Rolling walker (2 wheeled) Transfers: Sit to/from Stand Sit to Stand: Supervision Stand pivot transfers: Supervision            Balance Overall balance assessment: Needs assistance Sitting-balance support: No upper extremity supported;Feet supported Sitting balance-Leahy Scale: Good     Standing balance support: No upper extremity supported Standing balance-Leahy Scale: Fair Standing balance comment: static stand without UE support                           ADL either performed or assessed with clinical judgement   ADL Overall ADL's : Needs assistance/impaired     Grooming: Wash/dry hands;Wash/dry face;Oral  care;Brushing hair;Sitting;Set up Grooming Details (indicate cue type and reason):  (extra time due to fatigue)                             Functional mobility during ADLs: Supervision/safety       Vision       Perception     Praxis      Cognition Arousal/Alertness: Awake/alert Behavior During Therapy: Flat affect;Anxious Overall Cognitive Status: Within Functional Limits for tasks assessed                                 General Comments: Becomes anxious following endurance exercises in standing        Exercises     Shoulder Instructions       General Comments      Pertinent Vitals/ Pain       Pain Assessment: No/denies pain  Home Living                                          Prior Functioning/Environment              Frequency  Min 2X/week        Progress Toward Goals  OT Goals(current goals can now be found in the care  plan section)  Progress towards OT goals: Progressing toward goals  Acute Rehab OT Goals Patient Stated Goal: home OT Goal Formulation: With patient Time For Goal Achievement: 10/02/20 Potential to Achieve Goals: Good ADL Goals Pt Will Perform Grooming: with supervision;standing Pt Will Perform Upper Body Bathing: with set-up;sitting Pt Will Perform Lower Body Bathing: with supervision;sit to/from stand Pt Will Perform Upper Body Dressing: with set-up;sitting Pt Will Perform Lower Body Dressing: with supervision;sit to/from stand Pt Will Transfer to Toilet: with supervision;ambulating Pt Will Perform Toileting - Clothing Manipulation and hygiene: with supervision;sit to/from stand Additional ADL Goal #1: Pt will tolerate ADL, maintaining Sp02 >88% with intermittent rest breaks on <7L 02  Plan Discharge plan remains appropriate    Co-evaluation                 AM-PAC OT "6 Clicks" Daily Activity     Outcome Measure   Help from another person eating meals?: None Help  from another person taking care of personal grooming?: A Little Help from another person toileting, which includes using toliet, bedpan, or urinal?: A Little Help from another person bathing (including washing, rinsing, drying)?: A Little Help from another person to put on and taking off regular upper body clothing?: A Little Help from another person to put on and taking off regular lower body clothing?: A Little 6 Click Score: 19    End of Session Equipment Utilized During Treatment: Oxygen;Rolling walker  OT Visit Diagnosis: Unsteadiness on feet (R26.81);Other (comment)   Activity Tolerance Treatment limited secondary to medical complications (Comment)   Patient Left in chair;with call bell/phone within reach   Nurse Communication Other (comment)        Time: 1552-0802 OT Time Calculation (min): 44 min  Charges: OT General Charges $OT Visit: 1 Visit OT Treatments $Self Care/Home Management : 23-37 mins $Therapeutic Activity: 8-22 mins  Lodema Hong, [place] 09/22/2020, 2:01 PM

## 2020-09-23 ENCOUNTER — Encounter: Payer: Self-pay | Admitting: Internal Medicine

## 2020-09-24 DIAGNOSIS — J439 Emphysema, unspecified: Secondary | ICD-10-CM | POA: Diagnosis not present

## 2020-09-24 DIAGNOSIS — I2510 Atherosclerotic heart disease of native coronary artery without angina pectoris: Secondary | ICD-10-CM | POA: Diagnosis not present

## 2020-09-24 DIAGNOSIS — J9622 Acute and chronic respiratory failure with hypercapnia: Secondary | ICD-10-CM | POA: Diagnosis not present

## 2020-09-24 DIAGNOSIS — D539 Nutritional anemia, unspecified: Secondary | ICD-10-CM | POA: Diagnosis not present

## 2020-09-24 DIAGNOSIS — J9621 Acute and chronic respiratory failure with hypoxia: Secondary | ICD-10-CM | POA: Diagnosis not present

## 2020-09-24 DIAGNOSIS — U071 COVID-19: Secondary | ICD-10-CM | POA: Diagnosis not present

## 2020-09-24 DIAGNOSIS — I119 Hypertensive heart disease without heart failure: Secondary | ICD-10-CM | POA: Diagnosis not present

## 2020-09-24 DIAGNOSIS — J1282 Pneumonia due to coronavirus disease 2019: Secondary | ICD-10-CM | POA: Diagnosis not present

## 2020-09-24 DIAGNOSIS — I2721 Secondary pulmonary arterial hypertension: Secondary | ICD-10-CM | POA: Diagnosis not present

## 2020-09-25 DIAGNOSIS — J439 Emphysema, unspecified: Secondary | ICD-10-CM | POA: Diagnosis not present

## 2020-09-25 DIAGNOSIS — I119 Hypertensive heart disease without heart failure: Secondary | ICD-10-CM | POA: Diagnosis not present

## 2020-09-25 DIAGNOSIS — I2510 Atherosclerotic heart disease of native coronary artery without angina pectoris: Secondary | ICD-10-CM | POA: Diagnosis not present

## 2020-09-25 DIAGNOSIS — U071 COVID-19: Secondary | ICD-10-CM | POA: Diagnosis not present

## 2020-09-25 DIAGNOSIS — J1282 Pneumonia due to coronavirus disease 2019: Secondary | ICD-10-CM | POA: Diagnosis not present

## 2020-09-25 DIAGNOSIS — J9621 Acute and chronic respiratory failure with hypoxia: Secondary | ICD-10-CM | POA: Diagnosis not present

## 2020-09-25 DIAGNOSIS — D539 Nutritional anemia, unspecified: Secondary | ICD-10-CM | POA: Diagnosis not present

## 2020-09-25 DIAGNOSIS — I2721 Secondary pulmonary arterial hypertension: Secondary | ICD-10-CM | POA: Diagnosis not present

## 2020-09-25 DIAGNOSIS — J9622 Acute and chronic respiratory failure with hypercapnia: Secondary | ICD-10-CM | POA: Diagnosis not present

## 2020-09-26 DIAGNOSIS — D539 Nutritional anemia, unspecified: Secondary | ICD-10-CM | POA: Diagnosis not present

## 2020-09-26 DIAGNOSIS — U071 COVID-19: Secondary | ICD-10-CM | POA: Diagnosis not present

## 2020-09-26 DIAGNOSIS — J1282 Pneumonia due to coronavirus disease 2019: Secondary | ICD-10-CM | POA: Diagnosis not present

## 2020-09-26 DIAGNOSIS — I119 Hypertensive heart disease without heart failure: Secondary | ICD-10-CM | POA: Diagnosis not present

## 2020-09-26 DIAGNOSIS — I2510 Atherosclerotic heart disease of native coronary artery without angina pectoris: Secondary | ICD-10-CM | POA: Diagnosis not present

## 2020-09-26 DIAGNOSIS — I2721 Secondary pulmonary arterial hypertension: Secondary | ICD-10-CM | POA: Diagnosis not present

## 2020-09-26 DIAGNOSIS — J9622 Acute and chronic respiratory failure with hypercapnia: Secondary | ICD-10-CM | POA: Diagnosis not present

## 2020-09-26 DIAGNOSIS — J439 Emphysema, unspecified: Secondary | ICD-10-CM | POA: Diagnosis not present

## 2020-09-26 DIAGNOSIS — J9621 Acute and chronic respiratory failure with hypoxia: Secondary | ICD-10-CM | POA: Diagnosis not present

## 2020-09-30 DIAGNOSIS — J1282 Pneumonia due to coronavirus disease 2019: Secondary | ICD-10-CM | POA: Diagnosis not present

## 2020-09-30 DIAGNOSIS — I2510 Atherosclerotic heart disease of native coronary artery without angina pectoris: Secondary | ICD-10-CM | POA: Diagnosis not present

## 2020-09-30 DIAGNOSIS — D539 Nutritional anemia, unspecified: Secondary | ICD-10-CM | POA: Diagnosis not present

## 2020-09-30 DIAGNOSIS — U071 COVID-19: Secondary | ICD-10-CM | POA: Diagnosis not present

## 2020-09-30 DIAGNOSIS — J439 Emphysema, unspecified: Secondary | ICD-10-CM | POA: Diagnosis not present

## 2020-09-30 DIAGNOSIS — J9622 Acute and chronic respiratory failure with hypercapnia: Secondary | ICD-10-CM | POA: Diagnosis not present

## 2020-09-30 DIAGNOSIS — J9621 Acute and chronic respiratory failure with hypoxia: Secondary | ICD-10-CM | POA: Diagnosis not present

## 2020-09-30 DIAGNOSIS — I119 Hypertensive heart disease without heart failure: Secondary | ICD-10-CM | POA: Diagnosis not present

## 2020-09-30 DIAGNOSIS — I2721 Secondary pulmonary arterial hypertension: Secondary | ICD-10-CM | POA: Diagnosis not present

## 2020-10-02 DIAGNOSIS — J309 Allergic rhinitis, unspecified: Secondary | ICD-10-CM | POA: Diagnosis not present

## 2020-10-02 DIAGNOSIS — I10 Essential (primary) hypertension: Secondary | ICD-10-CM | POA: Diagnosis not present

## 2020-10-02 DIAGNOSIS — E785 Hyperlipidemia, unspecified: Secondary | ICD-10-CM | POA: Diagnosis not present

## 2020-10-02 DIAGNOSIS — I2510 Atherosclerotic heart disease of native coronary artery without angina pectoris: Secondary | ICD-10-CM | POA: Diagnosis not present

## 2020-10-02 DIAGNOSIS — J449 Chronic obstructive pulmonary disease, unspecified: Secondary | ICD-10-CM | POA: Diagnosis not present

## 2020-10-03 DIAGNOSIS — D539 Nutritional anemia, unspecified: Secondary | ICD-10-CM | POA: Diagnosis not present

## 2020-10-03 DIAGNOSIS — J1282 Pneumonia due to coronavirus disease 2019: Secondary | ICD-10-CM | POA: Diagnosis not present

## 2020-10-03 DIAGNOSIS — J439 Emphysema, unspecified: Secondary | ICD-10-CM | POA: Diagnosis not present

## 2020-10-03 DIAGNOSIS — J9621 Acute and chronic respiratory failure with hypoxia: Secondary | ICD-10-CM | POA: Diagnosis not present

## 2020-10-03 DIAGNOSIS — I2721 Secondary pulmonary arterial hypertension: Secondary | ICD-10-CM | POA: Diagnosis not present

## 2020-10-03 DIAGNOSIS — J9622 Acute and chronic respiratory failure with hypercapnia: Secondary | ICD-10-CM | POA: Diagnosis not present

## 2020-10-03 DIAGNOSIS — U071 COVID-19: Secondary | ICD-10-CM | POA: Diagnosis not present

## 2020-10-03 DIAGNOSIS — I119 Hypertensive heart disease without heart failure: Secondary | ICD-10-CM | POA: Diagnosis not present

## 2020-10-03 DIAGNOSIS — I2510 Atherosclerotic heart disease of native coronary artery without angina pectoris: Secondary | ICD-10-CM | POA: Diagnosis not present

## 2020-10-08 DIAGNOSIS — J9622 Acute and chronic respiratory failure with hypercapnia: Secondary | ICD-10-CM | POA: Diagnosis not present

## 2020-10-08 DIAGNOSIS — J439 Emphysema, unspecified: Secondary | ICD-10-CM | POA: Diagnosis not present

## 2020-10-08 DIAGNOSIS — I119 Hypertensive heart disease without heart failure: Secondary | ICD-10-CM | POA: Diagnosis not present

## 2020-10-08 DIAGNOSIS — I2721 Secondary pulmonary arterial hypertension: Secondary | ICD-10-CM | POA: Diagnosis not present

## 2020-10-08 DIAGNOSIS — J1282 Pneumonia due to coronavirus disease 2019: Secondary | ICD-10-CM | POA: Diagnosis not present

## 2020-10-08 DIAGNOSIS — U071 COVID-19: Secondary | ICD-10-CM | POA: Diagnosis not present

## 2020-10-08 DIAGNOSIS — D539 Nutritional anemia, unspecified: Secondary | ICD-10-CM | POA: Diagnosis not present

## 2020-10-08 DIAGNOSIS — I2510 Atherosclerotic heart disease of native coronary artery without angina pectoris: Secondary | ICD-10-CM | POA: Diagnosis not present

## 2020-10-08 DIAGNOSIS — J9621 Acute and chronic respiratory failure with hypoxia: Secondary | ICD-10-CM | POA: Diagnosis not present

## 2020-10-10 DIAGNOSIS — I119 Hypertensive heart disease without heart failure: Secondary | ICD-10-CM | POA: Diagnosis not present

## 2020-10-10 DIAGNOSIS — J439 Emphysema, unspecified: Secondary | ICD-10-CM | POA: Diagnosis not present

## 2020-10-10 DIAGNOSIS — I2721 Secondary pulmonary arterial hypertension: Secondary | ICD-10-CM | POA: Diagnosis not present

## 2020-10-10 DIAGNOSIS — U071 COVID-19: Secondary | ICD-10-CM | POA: Diagnosis not present

## 2020-10-10 DIAGNOSIS — J9622 Acute and chronic respiratory failure with hypercapnia: Secondary | ICD-10-CM | POA: Diagnosis not present

## 2020-10-10 DIAGNOSIS — I2510 Atherosclerotic heart disease of native coronary artery without angina pectoris: Secondary | ICD-10-CM | POA: Diagnosis not present

## 2020-10-10 DIAGNOSIS — D539 Nutritional anemia, unspecified: Secondary | ICD-10-CM | POA: Diagnosis not present

## 2020-10-10 DIAGNOSIS — J1282 Pneumonia due to coronavirus disease 2019: Secondary | ICD-10-CM | POA: Diagnosis not present

## 2020-10-10 DIAGNOSIS — J9621 Acute and chronic respiratory failure with hypoxia: Secondary | ICD-10-CM | POA: Diagnosis not present

## 2020-10-13 DIAGNOSIS — I739 Peripheral vascular disease, unspecified: Secondary | ICD-10-CM | POA: Diagnosis not present

## 2020-10-13 DIAGNOSIS — G4733 Obstructive sleep apnea (adult) (pediatric): Secondary | ICD-10-CM | POA: Diagnosis not present

## 2020-10-13 DIAGNOSIS — J9610 Chronic respiratory failure, unspecified whether with hypoxia or hypercapnia: Secondary | ICD-10-CM | POA: Diagnosis not present

## 2020-10-13 DIAGNOSIS — E785 Hyperlipidemia, unspecified: Secondary | ICD-10-CM | POA: Diagnosis not present

## 2020-10-13 DIAGNOSIS — Z955 Presence of coronary angioplasty implant and graft: Secondary | ICD-10-CM | POA: Diagnosis not present

## 2020-10-13 DIAGNOSIS — G8929 Other chronic pain: Secondary | ICD-10-CM | POA: Diagnosis not present

## 2020-10-13 DIAGNOSIS — F324 Major depressive disorder, single episode, in partial remission: Secondary | ICD-10-CM | POA: Diagnosis not present

## 2020-10-13 DIAGNOSIS — Z9981 Dependence on supplemental oxygen: Secondary | ICD-10-CM | POA: Diagnosis not present

## 2020-10-13 DIAGNOSIS — F419 Anxiety disorder, unspecified: Secondary | ICD-10-CM | POA: Diagnosis not present

## 2020-10-13 DIAGNOSIS — I25119 Atherosclerotic heart disease of native coronary artery with unspecified angina pectoris: Secondary | ICD-10-CM | POA: Diagnosis not present

## 2020-10-13 DIAGNOSIS — J449 Chronic obstructive pulmonary disease, unspecified: Secondary | ICD-10-CM | POA: Diagnosis not present

## 2020-10-15 ENCOUNTER — Telehealth: Payer: Self-pay | Admitting: Internal Medicine

## 2020-10-15 DIAGNOSIS — I2721 Secondary pulmonary arterial hypertension: Secondary | ICD-10-CM | POA: Diagnosis not present

## 2020-10-15 DIAGNOSIS — J9621 Acute and chronic respiratory failure with hypoxia: Secondary | ICD-10-CM | POA: Diagnosis not present

## 2020-10-15 DIAGNOSIS — D539 Nutritional anemia, unspecified: Secondary | ICD-10-CM | POA: Diagnosis not present

## 2020-10-15 DIAGNOSIS — J439 Emphysema, unspecified: Secondary | ICD-10-CM | POA: Diagnosis not present

## 2020-10-15 DIAGNOSIS — J9622 Acute and chronic respiratory failure with hypercapnia: Secondary | ICD-10-CM | POA: Diagnosis not present

## 2020-10-15 DIAGNOSIS — U071 COVID-19: Secondary | ICD-10-CM | POA: Diagnosis not present

## 2020-10-15 DIAGNOSIS — I2510 Atherosclerotic heart disease of native coronary artery without angina pectoris: Secondary | ICD-10-CM | POA: Diagnosis not present

## 2020-10-15 DIAGNOSIS — I119 Hypertensive heart disease without heart failure: Secondary | ICD-10-CM | POA: Diagnosis not present

## 2020-10-15 DIAGNOSIS — J1282 Pneumonia due to coronavirus disease 2019: Secondary | ICD-10-CM | POA: Diagnosis not present

## 2020-10-15 MED ORDER — AZITHROMYCIN 250 MG PO TABS: ORAL_TABLET | ORAL | 0 refills | Status: DC

## 2020-10-15 MED ORDER — PREDNISONE 10 MG PO TABS: ORAL_TABLET | ORAL | 0 refills | Status: DC

## 2020-10-15 NOTE — Telephone Encounter (Signed)
See last avs changed to ceiling of 20 mg pred/ floor of 5 mg per day so she should stay on 20 mg /day until better and use neb up to every 4 hours as needs per ABC plan   Zpak

## 2020-10-15 NOTE — Telephone Encounter (Signed)
Called and spoke with patient to let her know the recs form Dr. Melvyn Novas. She expressed understanding and verified pharmacy. Nothing further needed at this time.

## 2020-10-15 NOTE — Telephone Encounter (Signed)
Called and spoke with patient who is experiencing increased head congestion, runny nose, sinus pressure, sternum pressure, and cough- no sputum with cough. Having SOB on exertion, expiratory wheezing- has changed since [employer] with [facility] saw pt last week. Patient just finished nebulizer treatment. Angel did call pt's cardiology since she is in an abnormal rhythm. Dr Terrence Dupont recommended Glenard Haring to call pulmonary. Patient was in hospital on 09/15/20 for Covid Pneumonia. Denies any fever    Dr. Melvyn Novas please advise

## 2020-10-15 NOTE — Telephone Encounter (Signed)
Suzanne Dickson states pt is experiencing increased head congestion,runny nose,sinus pressure,sternum pressure, cough- not sputum with cough. Having SOB on exertion, expiratory wheezing- has changed since Suzanne Dickson saw pt last week,just finished nebulizer treatment. Suzanne Dickson did call pt's cardiology since she is in an abnormal rhythm. Dr Suzanne Dickson recommended Suzanne Dickson to call pulmonary.  Please advise-pt can be reached at [phone]

## 2020-10-17 DIAGNOSIS — J9622 Acute and chronic respiratory failure with hypercapnia: Secondary | ICD-10-CM | POA: Diagnosis not present

## 2020-10-17 DIAGNOSIS — I2510 Atherosclerotic heart disease of native coronary artery without angina pectoris: Secondary | ICD-10-CM | POA: Diagnosis not present

## 2020-10-17 DIAGNOSIS — J9621 Acute and chronic respiratory failure with hypoxia: Secondary | ICD-10-CM | POA: Diagnosis not present

## 2020-10-17 DIAGNOSIS — D539 Nutritional anemia, unspecified: Secondary | ICD-10-CM | POA: Diagnosis not present

## 2020-10-17 DIAGNOSIS — I2721 Secondary pulmonary arterial hypertension: Secondary | ICD-10-CM | POA: Diagnosis not present

## 2020-10-17 DIAGNOSIS — U071 COVID-19: Secondary | ICD-10-CM | POA: Diagnosis not present

## 2020-10-17 DIAGNOSIS — I119 Hypertensive heart disease without heart failure: Secondary | ICD-10-CM | POA: Diagnosis not present

## 2020-10-17 DIAGNOSIS — J439 Emphysema, unspecified: Secondary | ICD-10-CM | POA: Diagnosis not present

## 2020-10-17 DIAGNOSIS — J1282 Pneumonia due to coronavirus disease 2019: Secondary | ICD-10-CM | POA: Diagnosis not present

## 2020-10-19 DIAGNOSIS — J42 Unspecified chronic bronchitis: Secondary | ICD-10-CM | POA: Diagnosis not present

## 2020-10-19 DIAGNOSIS — I10 Essential (primary) hypertension: Secondary | ICD-10-CM | POA: Diagnosis not present

## 2020-10-29 DIAGNOSIS — I2510 Atherosclerotic heart disease of native coronary artery without angina pectoris: Secondary | ICD-10-CM | POA: Diagnosis not present

## 2020-10-29 DIAGNOSIS — U071 COVID-19: Secondary | ICD-10-CM | POA: Diagnosis not present

## 2020-10-29 DIAGNOSIS — J9622 Acute and chronic respiratory failure with hypercapnia: Secondary | ICD-10-CM | POA: Diagnosis not present

## 2020-10-29 DIAGNOSIS — I119 Hypertensive heart disease without heart failure: Secondary | ICD-10-CM | POA: Diagnosis not present

## 2020-10-29 DIAGNOSIS — J9621 Acute and chronic respiratory failure with hypoxia: Secondary | ICD-10-CM | POA: Diagnosis not present

## 2020-10-29 DIAGNOSIS — D539 Nutritional anemia, unspecified: Secondary | ICD-10-CM | POA: Diagnosis not present

## 2020-10-29 DIAGNOSIS — J1282 Pneumonia due to coronavirus disease 2019: Secondary | ICD-10-CM | POA: Diagnosis not present

## 2020-10-29 DIAGNOSIS — I2721 Secondary pulmonary arterial hypertension: Secondary | ICD-10-CM | POA: Diagnosis not present

## 2020-10-29 DIAGNOSIS — J439 Emphysema, unspecified: Secondary | ICD-10-CM | POA: Diagnosis not present

## 2020-10-31 DIAGNOSIS — J9622 Acute and chronic respiratory failure with hypercapnia: Secondary | ICD-10-CM | POA: Diagnosis not present

## 2020-10-31 DIAGNOSIS — I2510 Atherosclerotic heart disease of native coronary artery without angina pectoris: Secondary | ICD-10-CM | POA: Diagnosis not present

## 2020-10-31 DIAGNOSIS — I2721 Secondary pulmonary arterial hypertension: Secondary | ICD-10-CM | POA: Diagnosis not present

## 2020-10-31 DIAGNOSIS — U071 COVID-19: Secondary | ICD-10-CM | POA: Diagnosis not present

## 2020-10-31 DIAGNOSIS — J1282 Pneumonia due to coronavirus disease 2019: Secondary | ICD-10-CM | POA: Diagnosis not present

## 2020-10-31 DIAGNOSIS — D539 Nutritional anemia, unspecified: Secondary | ICD-10-CM | POA: Diagnosis not present

## 2020-10-31 DIAGNOSIS — J439 Emphysema, unspecified: Secondary | ICD-10-CM | POA: Diagnosis not present

## 2020-10-31 DIAGNOSIS — I119 Hypertensive heart disease without heart failure: Secondary | ICD-10-CM | POA: Diagnosis not present

## 2020-10-31 DIAGNOSIS — J9621 Acute and chronic respiratory failure with hypoxia: Secondary | ICD-10-CM | POA: Diagnosis not present

## 2020-11-05 DIAGNOSIS — I119 Hypertensive heart disease without heart failure: Secondary | ICD-10-CM | POA: Diagnosis not present

## 2020-11-05 DIAGNOSIS — I2510 Atherosclerotic heart disease of native coronary artery without angina pectoris: Secondary | ICD-10-CM | POA: Diagnosis not present

## 2020-11-05 DIAGNOSIS — J9622 Acute and chronic respiratory failure with hypercapnia: Secondary | ICD-10-CM | POA: Diagnosis not present

## 2020-11-05 DIAGNOSIS — D539 Nutritional anemia, unspecified: Secondary | ICD-10-CM | POA: Diagnosis not present

## 2020-11-05 DIAGNOSIS — J1282 Pneumonia due to coronavirus disease 2019: Secondary | ICD-10-CM | POA: Diagnosis not present

## 2020-11-05 DIAGNOSIS — J439 Emphysema, unspecified: Secondary | ICD-10-CM | POA: Diagnosis not present

## 2020-11-05 DIAGNOSIS — J9621 Acute and chronic respiratory failure with hypoxia: Secondary | ICD-10-CM | POA: Diagnosis not present

## 2020-11-05 DIAGNOSIS — I2721 Secondary pulmonary arterial hypertension: Secondary | ICD-10-CM | POA: Diagnosis not present

## 2020-11-05 DIAGNOSIS — U071 COVID-19: Secondary | ICD-10-CM | POA: Diagnosis not present

## 2020-11-07 ENCOUNTER — Telehealth: Payer: Self-pay | Admitting: Internal Medicine

## 2020-11-07 DIAGNOSIS — J42 Unspecified chronic bronchitis: Secondary | ICD-10-CM | POA: Diagnosis not present

## 2020-11-07 MED ORDER — ALBUTEROL SULFATE (2.5 MG/3ML) 0.083% IN NEBU: INHALATION_SOLUTION | RESPIRATORY_TRACT | 2 refills | Status: DC

## 2020-11-07 MED ORDER — ALBUTEROL SULFATE (2.5 MG/3ML) 0.083% IN NEBU: 2.5000 mg | INHALATION_SOLUTION | RESPIRATORY_TRACT | 2 refills | Status: DC | PRN

## 2020-11-07 NOTE — Telephone Encounter (Signed)
Called and spoke with Coralyn Mark at [facility]. She stated that the [place] is very picky about nebulizer medications and require them to state every 4 hours and as needed. She also stated that the quantity needed to be changed to 180 mL. I advised her that I would go ahead and fix the order. She verbalized understanding.   Nothing further needed at time of call.

## 2020-11-07 NOTE — Telephone Encounter (Signed)
Call made to patient, confirmed DOB. Medication and pharmacy confirmed. Refill sent.   Nothing further needed at this time.

## 2020-11-12 DIAGNOSIS — I2721 Secondary pulmonary arterial hypertension: Secondary | ICD-10-CM | POA: Diagnosis not present

## 2020-11-12 DIAGNOSIS — J9622 Acute and chronic respiratory failure with hypercapnia: Secondary | ICD-10-CM | POA: Diagnosis not present

## 2020-11-12 DIAGNOSIS — J439 Emphysema, unspecified: Secondary | ICD-10-CM | POA: Diagnosis not present

## 2020-11-12 DIAGNOSIS — J9621 Acute and chronic respiratory failure with hypoxia: Secondary | ICD-10-CM | POA: Diagnosis not present

## 2020-11-12 DIAGNOSIS — J1282 Pneumonia due to coronavirus disease 2019: Secondary | ICD-10-CM | POA: Diagnosis not present

## 2020-11-12 DIAGNOSIS — I2510 Atherosclerotic heart disease of native coronary artery without angina pectoris: Secondary | ICD-10-CM | POA: Diagnosis not present

## 2020-11-12 DIAGNOSIS — D539 Nutritional anemia, unspecified: Secondary | ICD-10-CM | POA: Diagnosis not present

## 2020-11-12 DIAGNOSIS — U071 COVID-19: Secondary | ICD-10-CM | POA: Diagnosis not present

## 2020-11-12 DIAGNOSIS — I119 Hypertensive heart disease without heart failure: Secondary | ICD-10-CM | POA: Diagnosis not present

## 2020-11-18 DIAGNOSIS — J42 Unspecified chronic bronchitis: Secondary | ICD-10-CM | POA: Diagnosis not present

## 2020-11-18 DIAGNOSIS — I10 Essential (primary) hypertension: Secondary | ICD-10-CM | POA: Diagnosis not present

## 2020-12-01 ENCOUNTER — Ambulatory Visit: Payer: Medicare PPO | Admitting: Internal Medicine

## 2020-12-06 ENCOUNTER — Other Ambulatory Visit: Payer: Self-pay | Admitting: Internal Medicine

## 2020-12-09 ENCOUNTER — Ambulatory Visit: Payer: Medicare PPO | Admitting: Internal Medicine

## 2020-12-09 NOTE — Progress Notes (Deleted)
Subjective:     Patient ID: Suzanne Dickson, female   DOB: [birth year]     MRN: [id number]    Brief patient profile:  79 yobf quit smoking on admit 09/02/14  and proved to have GOLD III copd 11/2014     History of Present Illness  09/22/2015  f/u ov/Suzanne Dickson re: copd III/ symb 1602bid on 2lpm at rest/ 4lpm ex  Chief Complaint  Patient presents with   Follow-up    [place] had hemoptysis and increased SOB. She took round pred and augmentin and symptoms are some better. She has noticed minimal wheezing at night. She has used neb 3 x over the past 2 wks.   liked bevespi better but wasn't covered then and not doing as well on the symbicort though hfa not ideal  - see a/p rec Plan A = Automatic =stop symbicort and restart Bevespi Take 2 puffs first thing in am and then another 2 puffs about 12 hours later.  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 She has been producing some yellow sputum over the past 2 days. She states also wheezing. She has been using her albuterol inhaler 3 x per day and she has not used neb.   Dyspnea:  MMRC3 = baseline can't walk 100 yards even at a slow pace at a flat grade s stopping due to sob on 2lpm but sometimes on RA now worse 2 weeks assoc with watery rhinitis no better with zyrtec  Cough:  more productive x 2 weeks assoc with pnds but min yellow  Sleeping: 2lpm  Bed flat and 2 big pillows  SABA use: not usually needing but now tid  02: 2lpm baseline now up to 3lpm  rec Prednisone 10 mg take  4 each am x 2 days,   2 each am x 2 days,  1 each am x 2 days and stop  Plan A = Automatic = symbicort 160 Take 2 puffs first thing in am and then another 2 puffs about 12 hours later.  Plan B = Backup Only use your albuterol as a rescue medication Plan C = Crisis - only use your albuterol nebulizer if you first try Plan B and it fails to help > ok to use the nebulizer up to every 4 hours but if start needing it regularly call for immediate appointment Goal for 02 is to keep saturation over 90%  Please schedule a follow up office visit in 4 weeks, sooner if needed  with all medications /inhalers/ solutions in hand so we can verify exactly what you are taking. This includes all medications from all doctors and over the counters    11/16/2017  f/u  ov/Suzanne Dickson re: copd III criteria but 02 dep/ did not bring all meds as req  Chief Complaint  Patient presents with   Follow-up    Breathing better but back quite back to baseline. She still has rhinitis and PND. She is using her albuterol inhaler 8-10 x per wk.  She rarely uses her neb.   Dyspnea: .MMRC3 = can't walk 100 yards even at a slow pace at a flat grade s stopping due to sob  On RA or  3lpm  - not monitoring sats as rec  Cough: none  Sleeping: ok 1pillow / bed flat SABA use: as above  02: 2lpm hs / prn daytime  rec Prednisone 10 mg take  4 each am x 2 days,   2 each am x 2 days,  1 each am x 2 days and stop  - add  No 02 needed at rest, max 02 when walking more than room to room at home to prevent desats ENT eval  > [facility] saw 12/07/17 dx ? 02 irritation     12/27/17 phone care for flair of nasal congestion purulent bronchitis: Augmentin 875 mg take one pill twice daily  X 10 days  Prednisone 10 mg take  4 each am x 2 days,   2 each am  x 2 days,  1 each am x 2 days and stop     01/11/2018  f/u ov/Suzanne Dickson re: gold III copd / still struggling with how/ when to use 02 which is irritating her nose Chief Complaint  Patient presents with   Follow-up    COPD GOLD III/O2 dep  Dyspnea:  MMRC4  = sob if tries to leave home or while getting dressed   Cough: x 2 days hs coughing fits  Sleeping: bed flat, 1-2 pillow  SABA use: on prednisone no need 02: 2lpm hs not humidified   rec Need to humidify your home 02 per [facility] Goal for your 02 is to keep saturations over 90 so don't use it at rest unless you need  Elevate the head of your bed on 6-8 inch blocks For cough > mucinex dm up to 1200 mg every 12 hours as needed If get worse >  Prednisone 10 mg take  4 each am x 2 days,   2 each am x 2 days,  1 each am x 2 days and stop      02/17/2018  f/u ov/Suzanne Dickson re: gold III criteria but 02 dep, doing better with nasal symptoms on humidified 02  Chief Complaint  Patient presents with   Follow-up    Breathing is some better. She has some nasal congestion in the am- clear, bloody nasal d/c. She is using her proair 2-3 x per wk on average.   Dyspnea:  Better vs priors/ pred helped nasal symptoms a lot and also doe  Cough: no Sleeping: 2 pillows/ bed blocks on order  SABA use: a little more since stopped prednisone  02: 2lpm 24/7 but up to 4 active rec Plan A = Automatic = Trelegy one click first thing each am Plan B = Backup Only use your albuterol as a rescue medication Plan C = Crisis - only use your albuterol nebulizer if you first try Plan B and it fails to help > ok to use the nebulizer up to every 4 hours but if start needing it regularly call for immediate appointmentPlan D = Deltasone= Prednisone - if needing the nebulizer more than twice daily then start prednisone :  Prednisone 10 mg take  4 each am x 2 days,   2 each am x 2 days,  1 each am x 2 days and stop   Use humdified 02 and adjust to sats overy 90% during the day 2 lpm  sitting  Sleeping and 4lpm    televist 10/06/2018 rec Remember prednisone is refillable and can be used when you are losing ground with your breathing and using more of your nebulizer than usual as Plan D below   Plan A = Automatic = Trelegy one click first thing each am Plan B = Backup Only use your albuterol as a rescue medication  Plan C = Crisis - only use your albuterol nebulizer if you first try Plan B and it fails to help > ok to use the nebulizer up to every 4 hours but if start needing it regularly call for immediate appointment Plan D = Deltasone= Prednisone - if needing the nebulizer more than twice daily then start prednisone : Prednisone 10 mg take  4 each am x 2 days,   2 each am x 2 days,  1 each am x 2 days and stop     02/12/19 Televist Dymista two puffs in each nostril at bedtime and if not better see Dr Janace Hoard  Continue zyrtec and pepcid at bedtime    03/29/2019  f/u ov/Suzanne Dickson re:  GOLD II/ 02 dep  On dymista 2 pffs hs / off pred now  Last took it one week prior to [place] Complaint  Patient presents with   Follow-up    Breathing is overall doing well. She has occ wheezing at night. She is using her albuterol inhaler about 8 x per wk and rarely uses neb.   Dyspnea:  Room to room but 'fine if stays sitting"  = "helps me breathe better" (ie very poor insight into the goals of resp rx to improve activity tol) Cough: assoc with some wheezing /watery rhinits no better even  on prednisone/dymista Sleeping:on a wedge x 45 degrees  SABA use: rarely at hs ? Helps some  02: 4lpm with activity  And 2-3 lpm at hs  rec Make sure you check your oxygen saturations at highest level of activity  Try albuterol 15 min before an activity that you know would make you short of breath and see if it makes any difference and if makes none then don't take it after activity unless you can't catch your breath. See your ENT doctor regarding your nasal congestion if dymista not helping    NP  rec  07/23/19  COPD: Continue Trelegy 1 puff daily in the morning (rinse mouth after use) Continue prednisone 10mg  daily  Chronic respiratory failure: Continue 4L oxygen at rest; use 4-6L on exertion to keep O2 >88-90%  Orders: Needs qualifying walk for POC with DME/ [facility] Needs larger Oxygen tanks 4-6L continuous    12/03/2019  f/u ov/Suzanne Dickson re:  GOLD II COPD / 02 dep  On Trelegy and pred 10mg  daily since august 2021 s flare  Chief Complaint  Patient presents with   Follow-up    Breathing has improved some since the last visit. She is using her albuterol inhaler 4-6 x per wk and has not used neb recently.   Dyspnea:  Room to room, nothing outdoors  Cough: none Sleeping: wedge at 45 degrees insomnia but no resp issues  SABA use: as above 02: 4lpm 24 x 6 with walking but not checking sats  rec Make  sure you check your oxygen saturations at highest level of activity   Prednisone ceiling is 20 mg per day until better, then 10 mg daily x one week, then 5 mg (one half of a 10 mg tablet)  - if get worse start over   06/02/2020  f/u ov/Suzanne Dickson re: COPD II copd/ 02 dep on trelegy and prednisone 10 mg one-half daily  Chief Complaint  Patient presents with   Follow-up    Having increased SOB recently- relates to pollen allergy. Has also noticed some wheezing. She is using her albuterol inhaler 2-3 x per day. She has not used her neb lately.    Dyspnea: baseline room to room with rollator/ worse with pollen, better on higher dose of pred and worked back to 5 mg s flare yet Cough: none  Sleeping: wedge = 45 degrees  SABA use: very lttile  02: 4lpm at rest, 6-7 lpm when walking Covid status:  vax x 3  Rec Make sure you check your oxygen saturation  at your highest level of activity      12/09/2020  f/u ov/Suzanne Dickson re: ***   maint on ***  No chief complaint on file.   Dyspnea:  *** Cough: *** Sleeping: *** SABA use: *** 02: *** Covid status:   ***   No obvious day to day or daytime  variability or assoc excess/ purulent sputum or mucus plugs or hemoptysis or cp or chest tightness, subjective wheeze or overt sinus or hb symptoms.   *** without nocturnal  or early am exacerbation  of respiratory  c/o's or need for noct saba. Also denies any obvious fluctuation of symptoms with weather or environmental changes or other aggravating or alleviating factors except as outlined above   No unusual exposure hx or h/o childhood pna/ asthma or knowledge of premature birth.  Current Allergies, Complete Past Medical History, Past Surgical History, Family History, and Social History were reviewed in [employer] record.  ROS  The following are not active complaints unless bolded Hoarseness, sore throat, dysphagia, dental problems, itching, sneezing,  nasal congestion or discharge of excess mucus or purulent secretions, ear ache,   fever, chills, sweats, unintended wt loss or wt gain, classically pleuritic or exertional cp,  orthopnea pnd or arm/hand swelling  or leg swelling, presyncope, palpitations, abdominal pain, anorexia, nausea, vomiting, diarrhea  or change in bowel habits or change in bladder habits, change in stools or change in urine, dysuria, hematuria,  rash, arthralgias, visual complaints, headache, numbness, weakness or ataxia or problems with walking or coordination,  change in mood or  memory.        No outpatient medications have been marked as taking for the 12/09/20 encounter (Appointment) with Tanda Rockers, MD.                Objective:  Physical Exam   12/09/2020 ***  06/02/2020     170  12/03/2019   162 03/29/2019     164  08/08/2018   154 10/28/2014        160 > 12/09/2014 161 > 03/14/2015 158 >    04/25/2015  161 >  06/20/2015  158 > 09/22/2015   159 > 11/19/2015 159 > 12/23/2015   159  > 02/03/2016    163  > 05/18/2016   161 > 11/17/2016   154 > 01/07/2017  157 > 04/18/2017   148 > 07/19/2017  145 > 10/19/2017    150  > 11/16/2017   150 >  01/11/2018    150> 02/17/2018  150     10/10/14 160 lb (72.576 kg)  09/16/14 156 lb (70.761 kg)  09/04/14 160 lb 13.6 oz (72.96 kg)      Vital signs reviewed  12/09/2020  - Note at rest 02 sats  ***% on ***   General appearance:    ***   Mod bar*** Assessment:

## 2020-12-24 ENCOUNTER — Other Ambulatory Visit: Payer: Self-pay | Admitting: Internal Medicine

## 2020-12-25 ENCOUNTER — Other Ambulatory Visit: Payer: Self-pay | Admitting: Internal Medicine

## 2021-01-01 DIAGNOSIS — M1990 Unspecified osteoarthritis, unspecified site: Secondary | ICD-10-CM | POA: Diagnosis not present

## 2021-01-01 DIAGNOSIS — I2510 Atherosclerotic heart disease of native coronary artery without angina pectoris: Secondary | ICD-10-CM | POA: Diagnosis not present

## 2021-01-01 DIAGNOSIS — I10 Essential (primary) hypertension: Secondary | ICD-10-CM | POA: Diagnosis not present

## 2021-01-01 DIAGNOSIS — E785 Hyperlipidemia, unspecified: Secondary | ICD-10-CM | POA: Diagnosis not present

## 2021-01-06 ENCOUNTER — Other Ambulatory Visit: Payer: Self-pay | Admitting: Internal Medicine

## 2021-01-27 ENCOUNTER — Ambulatory Visit: Payer: Medicare PPO | Admitting: Internal Medicine

## 2021-01-27 NOTE — Progress Notes (Deleted)
Subjective:     Patient ID: Suzanne Dickson, female   DOB: Jul 14, 1945     MRN: [id number]    Brief patient profile:  3 yobf quit smoking on admit 09/02/14  and proved to have GOLD III copd 11/2014     History of Present Illness  09/22/2015  f/u ov/Suzanne Dickson re: copd III/ symb 1602bid on 2lpm at rest/ 4lpm ex  Chief Complaint  Patient presents with   Follow-up    [place] had hemoptysis and increased SOB. She took round pred and augmentin and symptoms are some better. She has noticed minimal wheezing at night. She has used neb 3 x over the past 2 wks.   liked bevespi better but wasn't covered then and not doing as well on the symbicort though hfa not ideal  - see a/p rec Plan A = Automatic =stop symbicort and restart Bevespi Take 2 puffs first thing in am and then another 2 puffs about 12 hours later.  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 She has been producing some yellow sputum over the past 2 days. She states also wheezing. She has been using her albuterol inhaler 3 x per day and she has not used neb.   Dyspnea:  MMRC3 = baseline can't walk 100 yards even at a slow pace at a flat grade s stopping due to sob on 2lpm but sometimes on RA now worse 2 weeks assoc with watery rhinitis no better with zyrtec  Cough:  more productive x 2 weeks assoc with pnds but min yellow  Sleeping: 2lpm  Bed flat and 2 big pillows  SABA use: not usually needing but now tid  02: 2lpm baseline now up to 3lpm  rec Prednisone 10 mg take  4 each am x 2 days,   2 each am x 2 days,  1 each am x 2 days and stop  Plan A = Automatic = symbicort 160 Take 2 puffs first thing in am and then another 2 puffs about 12 hours later.  Plan B = Backup Only use your albuterol as a rescue medication Plan C = Crisis - only use your albuterol nebulizer if you first try Plan B and it fails to help > ok to use the nebulizer up to every 4 hours but if start needing it regularly call for immediate appointment Goal for 02 is to keep saturation over 90%  Please schedule a follow up office visit in 4 weeks, sooner if needed  with all medications /inhalers/ solutions in hand so we can verify exactly what you are taking. This includes all medications from all doctors and over the counters    11/16/2017  f/u  ov/Suzanne Dickson re: copd III criteria but 02 dep/ did not bring all meds as req  Chief Complaint  Patient presents with   Follow-up    Breathing better but back quite back to baseline. She still has rhinitis and PND. She is using her albuterol inhaler 8-10 x per wk.  She rarely uses her neb.   Dyspnea: .MMRC3 = can't walk 100 yards even at a slow pace at a flat grade s stopping due to sob  On RA or  3lpm  - not monitoring sats as rec  Cough: none  Sleeping: ok 1pillow / bed flat SABA use: as above  02: 2lpm hs / prn daytime  rec Prednisone 10 mg take  4 each am x 2 days,   2 each am x 2 days,  1 each am x 2 days and stop  - add  No 02 needed at rest, max 02 when walking more than room to room at home to prevent desats ENT eval  > [facility]ers saw 12/07/17 dx ? 02 irritation     12/27/17 phone care for flair of nasal congestion purulent bronchitis: Augmentin 875 mg take one pill twice daily  X 10 days  Prednisone 10 mg take  4 each am x 2 days,   2 each am  x 2 days,  1 each am x 2 days and stop     01/11/2018  f/u ov/Suzanne Dickson re: gold III copd / still struggling with how/ when to use 02 which is irritating her nose Chief Complaint  Patient presents with   Follow-up    COPD GOLD III/O2 dep  Dyspnea:  MMRC4  = sob if tries to leave home or while getting dressed   Cough: x 2 days hs coughing fits  Sleeping: bed flat, 1-2 pillow  SABA use: on prednisone no need 02: 2lpm hs not humidified   rec Need to humidify your home 02 per [facility] Goal for your 02 is to keep saturations over 90 so don't use it at rest unless you need  Elevate the head of your bed on 6-8 inch blocks For cough > mucinex dm up to 1200 mg every 12 hours as needed If get worse >  Prednisone 10 mg take  4 each am x 2 days,   2 each am x 2 days,  1 each am x 2 days and stop      02/17/2018  f/u ov/Suzanne Dickson re: gold III criteria but 02 dep, doing better with nasal symptoms on humidified 02  Chief Complaint  Patient presents with   Follow-up    Breathing is some better. She has some nasal congestion in the am- clear, bloody nasal d/c. She is using her proair 2-3 x per wk on average.   Dyspnea:  Better vs priors/ pred helped nasal symptoms a lot and also doe  Cough: no Sleeping: 2 pillows/ bed blocks on order  SABA use: a little more since stopped prednisone  02: 2lpm 24/7 but up to 4 active rec Plan A = Automatic = Trelegy one click first thing each am Plan B = Backup Only use your albuterol as a rescue medication Plan C = Crisis - only use your albuterol nebulizer if you first try Plan B and it fails to help > ok to use the nebulizer up to every 4 hours but if start needing it regularly call for immediate appointmentPlan D = Deltasone= Prednisone - if needing the nebulizer more than twice daily then start prednisone :  Prednisone 10 mg take  4 each am x 2 days,   2 each am x 2 days,  1 each am x 2 days and stop   Use humdified 02 and adjust to sats overy 90% during the day 2 lpm  sitting  Sleeping and 4lpm    televist 10/06/2018 rec Remember prednisone is refillable and can be used when you are losing ground with your breathing and using more of your nebulizer than usual as Plan D below   Plan A = Automatic = Trelegy one click first thing each am Plan B = Backup Only use your albuterol as a rescue medication  Plan C = Crisis - only use your albuterol nebulizer if you first try Plan B and it fails to help > ok to use the nebulizer up to every 4 hours but if start needing it regularly call for immediate appointment Plan D = Deltasone= Prednisone - if needing the nebulizer more than twice daily then start prednisone : Prednisone 10 mg take  4 each am x 2 days,   2 each am x 2 days,  1 each am x 2 days and stop     02/12/19 Televist Suzanne Dickson two puffs in each nostril at bedtime and if not better see Dr Janace Hoard  Continue zyrtec and pepcid at bedtime    03/29/2019  f/u ov/Suzanne Dickson re:  GOLD II/ 02 dep  On Suzanne Dickson 2 pffs hs / off pred now  Last took it one week prior to [place] Complaint  Patient presents with   Follow-up    Breathing is overall doing well. She has occ wheezing at night. She is using her albuterol inhaler about 8 x per wk and rarely uses neb.   Dyspnea:  Room to room but 'fine if stays sitting"  = "helps me breathe better" (ie very poor insight into the goals of resp rx to improve activity tol) Cough: assoc with some wheezing /watery rhinits no better even  on prednisone/Suzanne Dickson Sleeping:on a wedge x 45 degrees  SABA use: rarely at hs ? Helps some  02: 4lpm with activity  And 2-3 lpm at hs  rec Make sure you check your oxygen saturations at highest level of activity  Try albuterol 15 min before an activity that you know would make you short of breath and see if it makes any difference and if makes none then don't take it after activity unless you can't catch your breath. See your ENT doctor regarding your nasal congestion if Suzanne Dickson not helping    NP  rec  07/23/19  COPD: Continue Trelegy 1 puff daily in the morning (rinse mouth after use) Continue prednisone 10mg  daily  Chronic respiratory failure: Continue 4L oxygen at rest; use 4-6L on exertion to keep O2 >88-90%  Orders: Needs qualifying walk for POC with DME/ [facility] Needs larger Oxygen tanks 4-6L continuous    12/03/2019  f/u ov/Suzanne Dickson re:  GOLD II COPD / 02 dep  On Trelegy and pred 10mg  daily since august 2021 s flare  Chief Complaint  Patient presents with   Follow-up    Breathing has improved some since the last visit. She is using her albuterol inhaler 4-6 x per wk and has not used neb recently.   Dyspnea:  Room to room, nothing outdoors  Cough: none Sleeping: wedge at 45 degrees insomnia but no resp issues  SABA use: as above 02: 4lpm 24 x 6 with walking but not checking sats  rec Make  sure you check your oxygen saturations at highest level of activity   Prednisone ceiling is 20 mg per day until better, then 10 mg daily x one week, then 5 mg (one half of a 10 mg tablet)  - if get worse start over   06/02/2020  f/u ov/Suzanne Dickson re: COPD II copd/ 02 dep on trelegy and prednisone 10 mg one-half daily  Chief Complaint  Patient presents with   Follow-up    Having increased SOB recently- relates to pollen allergy. Has also noticed some wheezing. She is using her albuterol inhaler 2-3 x per day. She has not used her neb lately.    Dyspnea: baseline room to room with rollator/ worse with pollen, better on higher dose of pred and worked back to 5 mg s flare yet Cough: none  Sleeping: wedge = 45 degrees  SABA use: very lttile  02: 4lpm at rest, 6-7 lpm when walking Covid status:  vax x 3  Rec No change medications  Make sure you check your oxygen saturation  at your highest level of activity     01/27/2021  f/u ov/Suzanne Dickson re: ***   maint on ***  No chief complaint on file.   Dyspnea:  *** Cough: *** Sleeping: *** SABA use: *** 02: *** Covid status:   ***   No obvious day  to day or daytime variability or assoc excess/ purulent sputum or mucus plugs or hemoptysis or cp or chest tightness, subjective wheeze or overt sinus or hb symptoms.   *** without nocturnal  or early am exacerbation  of respiratory  c/o's or need for noct saba. Also denies any obvious fluctuation of symptoms with weather or environmental changes or other aggravating or alleviating factors except as outlined above   No unusual exposure hx or h/o childhood pna/ asthma or knowledge of premature birth.  Current Allergies, Complete Past Medical History, Past Surgical History, Family History, and Social History were reviewed in [employer] record.  ROS  The following are not active complaints unless bolded Hoarseness, sore throat, dysphagia, dental problems, itching, sneezing,  nasal congestion or discharge of excess mucus or purulent secretions, ear ache,   fever, chills, sweats, unintended wt loss or wt gain, classically pleuritic or exertional cp,  orthopnea pnd or arm/hand swelling  or leg swelling, presyncope, palpitations, abdominal pain, anorexia, nausea, vomiting, diarrhea  or change in bowel habits or change in bladder habits, change in stools or change in urine, dysuria, hematuria,  rash, arthralgias, visual complaints, headache, numbness, weakness or ataxia or problems with walking or coordination,  change in mood or  memory.        No outpatient medications have been marked as taking for the 01/27/21 encounter (Appointment) with Tanda Rockers, MD.                             Objective:  Physical Exam   01/27/2021    *** 06/02/2020     170  12/03/2019   162 03/29/2019     164  08/08/2018   154 10/28/2014        160 > 12/09/2014 161 > 03/14/2015 158 >    04/25/2015  161 >  06/20/2015  158 > 09/22/2015   159 > 11/19/2015 159 > 12/23/2015   159  > 02/03/2016    163  > 05/18/2016   161 > 11/17/2016   154 > 01/07/2017  157 > 04/18/2017  148 > 07/19/2017  145 > 10/19/2017    150   > 11/16/2017   150 >  01/11/2018   150> 02/17/2018  150     10/10/14 160 lb (72.576 kg)  09/16/14 156 lb (70.761 kg)  09/04/14 160 lb 13.6 oz (72.96 kg)    Vital signs reviewed  01/27/2021  - Note at rest 02 sats  ***% on ***   General appearance:    ***       Mod bar***           Assessment:

## 2021-02-15 NOTE — Progress Notes (Signed)
Subjective:     Patient ID: Suzanne Dickson, female   DOB: November 16, 1945     MRN: [id number]    Brief patient profile:  69 yobf quit smoking on admit 09/02/14  and proved to have GOLD III copd 11/2014     History of Present Illness  09/22/2015  f/u ov/Suzanne Dickson re: copd III/ symb 1602bid on 2lpm at rest/ 4lpm ex  Chief Complaint  Patient presents with   Follow-up    [place] had hemoptysis and increased SOB. She took round pred and augmentin and symptoms are some better. She has noticed minimal wheezing at night. She has used neb 3 x over the past 2 wks.   liked bevespi better but wasn't covered then and not doing as well on the symbicort though hfa not ideal  - see a/p rec Plan A = Automatic =stop symbicort and restart Bevespi Take 2 puffs first thing in am and then another 2 puffs about 12 hours later.  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 She has been producing some yellow sputum over the past 2 days. She states also wheezing. She has been using her albuterol inhaler 3 x per day and she has not used neb.   Dyspnea:  MMRC3 = baseline can't walk 100 yards even at a slow pace at a flat grade s stopping due to sob on 2lpm but sometimes on RA now worse 2 weeks assoc with watery rhinitis no better with zyrtec  Cough:  more productive x 2 weeks assoc with pnds but min yellow  Sleeping: 2lpm  Bed flat and 2 big pillows  SABA use: not usually needing but now tid  02: 2lpm baseline now up to 3lpm  rec Prednisone 10 mg take  4 each am x 2 days,   2 each am x 2 days,  1 each am x 2 days and stop  Plan A = Automatic = symbicort 160 Take 2 puffs first thing in am and then another 2 puffs about 12 hours later.  Plan B = Backup Only use your albuterol as a rescue medication Plan C = Crisis - only use your albuterol nebulizer if you first try Plan B and it fails to help > ok to use the nebulizer up to every 4 hours but if start needing it regularly call for immediate appointment Goal for 02 is to keep saturation over 90%  Please schedule a follow up office visit in 4 weeks, sooner if needed  with all medications /inhalers/ solutions in hand so we can verify exactly what you are taking. This includes all medications from all doctors and over the counters    11/16/2017  f/u  ov/Suzanne Dickson re: copd III criteria but 02 dep/ did not bring all meds as req  Chief Complaint  Patient presents with   Follow-up    Breathing better but back quite back to baseline. She still has rhinitis and PND. She is using her albuterol inhaler 8-10 x per wk.  She rarely uses her neb.   Dyspnea: .MMRC3 = can't walk 100 yards even at a slow pace at a flat grade s stopping due to sob  On RA or  3lpm  - not monitoring sats as rec  Cough: none  Sleeping: ok 1pillow / bed flat SABA use: as above  02: 2lpm hs / prn daytime  rec Prednisone 10 mg take  4 each am x 2 days,   2 each am x 2 days,  1 each am x 2 days and stop  - add  No 02 needed at rest, max 02 when walking more than room to room at home to prevent desats ENT eval  > [facility] saw 12/07/17 dx ? 02 irritation     12/27/17 phone care for flair of nasal congestion purulent bronchitis: Augmentin 875 mg take one pill twice daily  X 10 days  Prednisone 10 mg take  4 each am x 2 days,   2 each am  x 2 days,  1 each am x 2 days and stop     01/11/2018  f/u ov/Suzanne Dickson re: gold III copd / still struggling with how/ when to use 02 which is irritating her nose Chief Complaint  Patient presents with   Follow-up    COPD GOLD III/O2 dep  Dyspnea:  MMRC4  = sob if tries to leave home or while getting dressed   Cough: x 2 days hs coughing fits  Sleeping: bed flat, 1-2 pillow  SABA use: on prednisone no need 02: 2lpm hs not humidified   rec Need to humidify your home 02 per [facility] Goal for your 02 is to keep saturations over 90 so don't use it at rest unless you need  Elevate the head of your bed on 6-8 inch blocks For cough > mucinex dm up to 1200 mg every 12 hours as needed If get worse >  Prednisone 10 mg take  4 each am x 2 days,   2 each am x 2 days,  1 each am x 2 days and stop      02/17/2018  f/u ov/Suzanne Dickson re: gold III criteria but 02 dep, doing better with nasal symptoms on humidified 02  Chief Complaint  Patient presents with   Follow-up    Breathing is some better. She has some nasal congestion in the am- clear, bloody nasal d/c. She is using her proair 2-3 x per wk on average.   Dyspnea:  Better vs priors/ pred helped nasal symptoms a lot and also doe  Cough: no Sleeping: 2 pillows/ bed blocks on order  SABA use: a little more since stopped prednisone  02: 2lpm 24/7 but up to 4 active rec Plan A = Automatic = Trelegy one click first thing each am Plan B = Backup Only use your albuterol as a rescue medication Plan C = Crisis - only use your albuterol nebulizer if you first try Plan B and it fails to help > ok to use the nebulizer up to every 4 hours but if start needing it regularly call for immediate appointmentPlan D = Deltasone= Prednisone - if needing the nebulizer more than twice daily then start prednisone :  Prednisone 10 mg take  4 each am x 2 days,   2 each am x 2 days,  1 each am x 2 days and stop   Use humdified 02 and adjust to sats overy 90% during the day 2 lpm  sitting  Sleeping and 4lpm    televist 10/06/2018 rec Remember prednisone is refillable and can be used when you are losing ground with your breathing and using more of your nebulizer than usual as Plan D below   Plan A = Automatic = Trelegy one click first thing each am Plan B = Backup Only use your albuterol as a rescue medication  Plan C = Crisis - only use your albuterol nebulizer if you first try Plan B and it fails to help > ok to use the nebulizer up to every 4 hours but if start needing it regularly call for immediate appointment Plan D = Deltasone= Prednisone - if needing the nebulizer more than twice daily then start prednisone : Prednisone 10 mg take  4 each am x 2 days,   2 each am x 2 days,  1 each am x 2 days and stop     02/12/19 Televist Dymista two puffs in each nostril at bedtime and if not better see Dr Janace Hoard  Continue zyrtec and pepcid at bedtime    03/29/2019  f/u ov/Suzanne Dickson re:  GOLD II/ 02 dep  On dymista 2 pffs hs / off pred now  Last took it one week prior to [place] Complaint  Patient presents with   Follow-up    Breathing is overall doing well. She has occ wheezing at night. She is using her albuterol inhaler about 8 x per wk and rarely uses neb.   Dyspnea:  Room to room but 'fine if stays sitting"  = "helps me breathe better" (ie very poor insight into the goals of resp rx to improve activity tol) Cough: assoc with some wheezing /watery rhinits no better even  on prednisone/dymista Sleeping:on a wedge x 45 degrees  SABA use: rarely at hs ? Helps some  02: 4lpm with activity  And 2-3 lpm at hs  rec Make sure you check your oxygen saturations at highest level of activity  Try albuterol 15 min before an activity that you know would make you short of breath and see if it makes any difference and if makes none then don't take it after activity unless you can't catch your breath. See your ENT doctor regarding your nasal congestion if dymista not helping    NP  rec  07/23/19  COPD: Continue Trelegy 1 puff daily in the morning (rinse mouth after use) Continue prednisone 10mg  daily  Chronic respiratory failure: Continue 4L oxygen at rest; use 4-6L on exertion to keep O2 >88-90%  Orders: Needs qualifying walk for POC with DME/ [facility] Needs larger Oxygen tanks 4-6L continuous    12/03/2019  f/u ov/Suzanne Dickson re:  GOLD II COPD / 02 dep  On Trelegy and pred 10mg  daily since august 2021 s flare  Chief Complaint  Patient presents with   Follow-up    Breathing has improved some since the last visit. She is using her albuterol inhaler 4-6 x per wk and has not used neb recently.   Dyspnea:  Room to room, nothing outdoors  Cough: none Sleeping: wedge at 45 degrees insomnia but no resp issues  SABA use: as above 02: 4lpm 24 x 6 with walking but not checking sats  rec Make  sure you check your oxygen saturations at highest level of activity   Prednisone ceiling is 20 mg per day until better, then 10 mg daily x one week, then 5 mg (one half of a 10 mg tablet)  - if get worse start over   06/02/2020  f/u ov/Suzanne Dickson re: COPD II copd/ 02 dep on trelegy and prednisone 10 mg one-half daily  Chief Complaint  Patient presents with   Follow-up    Having increased SOB recently- relates to pollen allergy. Has also noticed some wheezing. She is using her albuterol inhaler 2-3 x per day. She has not used her neb lately.   Dyspnea: baseline room to room with rollator/ worse with pollen, better on higher dose of pred and worked back to 5 mg s flare yet Cough: none  Sleeping: wedge = 45 degrees  SABA use: very lttile  02: 4lpm at rest, 6-7 lpm when walking Covid status:  vax x 3  Rec No change medications  Make sure you check your oxygen saturation  at your highest level of activity     02/16/2021  f/u ov/Suzanne Dickson re: GOLD 2/ 02 dep  maint on trelegy / since covid = prednisone x 20 mg   Chief Complaint  Patient presents with   Follow-up    Breathing is about the same.  She has occ wheezing. She is using her albuterol neb about 1 x per day and albuterol inhaler once at night.   Dyspnea:  room to room on 02 at 6-7 lpm  Cough: none  Sleeping: wedge x 45 degrees, wheezes some then off to sleep fine  SABA use: mostly after supper or right before bed  02: 6-7lpm  Covid status:   vax x 4    No obvious day to day or daytime variability or assoc excess/ purulent sputum or mucus plugs or hemoptysis or cp or chest tightness,   or overt sinus or hb symptoms.    Also denies any obvious fluctuation of symptoms with weather or environmental changes or other aggravating or alleviating factors except as outlined above   No unusual exposure hx or h/o childhood pna/ asthma or knowledge of premature birth.  Current Allergies, Complete Past Medical History, Past Surgical History, Family History, and Social History were reviewed in [employer] record.  ROS  The following are not active complaints unless bolded Hoarseness, sore throat, dysphagia, dental problems, itching, sneezing,  nasal congestion or discharge of excess mucus or purulent secretions, ear ache,   fever, chills, sweats, unintended wt loss or wt gain, classically pleuritic or exertional cp,  orthopnea pnd or arm/hand swelling  or leg swelling, presyncope, palpitations, abdominal pain, anorexia, nausea, vomiting, diarrhea  or change in bowel habits or change in bladder habits, change in stools or change in urine, dysuria, hematuria,  rash, arthralgias, visual complaints, headache, numbness, weakness or ataxia or problems with walking or coordination,  change in mood or  memory.        Current Meds  Medication Sig   acetaminophen (TYLENOL) 500 MG tablet Take 500 mg by mouth every 6 (six) hours as needed.   albuterol (PROVENTIL) (2.5 MG/3ML) 0.083% nebulizer solution Use 79mLs every 4 hours and as needed for shortness of breath.   albuterol (VENTOLIN HFA) 108 (90 Base) MCG/ACT inhaler INHALE 2  PUFFS BY MOUTH EVERY 4 HOURS AS NEEDED FOR WHEEZING FOR SHORTNESS OF BREATH   amLODipine (NORVASC) 5 MG tablet Take 5 mg by mouth daily.   aspirin  EC 81 MG tablet Take 81 mg by mouth daily. Swallow whole.   atorvastatin (LIPITOR) 20 MG tablet Take 20 mg by mouth at bedtime.   Azelastine-Fluticasone 137-50 MCG/ACT SUSP 2 PUFFS INTO EACH NOSTRIL AT BEDTIME   benzonatate (TESSALON) 200 MG capsule Take 1 capsule (200 mg total) by mouth 3 (three) times daily as needed for cough.   cetirizine (ZYRTEC) 10 MG tablet Take 10 mg by mouth at bedtime.   cholecalciferol (VITAMIN D) 25 MCG (1000 UNIT) tablet Take 1,000 Units by mouth daily.   cholecalciferol (VITAMIN D3) 25 MCG (1000 UNIT) tablet Take 1,000 Units by mouth daily.   clopidogrel (PLAVIX) 75 MG tablet Take 75 mg by mouth every morning.    clopidogrel (PLAVIX) 75 MG tablet Take 75 mg by mouth daily.   Cyanocobalamin (B-12 PO) Take 1 tablet by mouth daily.   famotidine (PEPCID) 20 MG tablet One after supper   ferrous sulfate 325 (65 FE) MG EC tablet Take 325 mg by mouth 3 (three) times daily with meals.    FERROUS SULFATE PO Take 3 tablets by mouth daily at 2 PM. Unsure of strength   Fluticasone-Umeclidin-Vilant (TRELEGY ELLIPTA) 100-62.5-25 MCG/INH AEPB Inhale 1 puff into the lungs daily.   hydrochlorothiazide (MICROZIDE) 12.5 MG capsule Take 12.5 mg by mouth daily.   ibuprofen (ADVIL) 200 MG tablet Take 400 mg by mouth every 6 (six) hours as needed (pain).   losartan (COZAAR) 50 MG tablet Take 50 mg by mouth every morning.   losartan (COZAAR) 50 MG tablet Take 50 mg by mouth daily.   metoprolol succinate (TOPROL-XL) 25 MG 24 hr tablet Take 25 mg by mouth daily.   nitroGLYCERIN (NITROSTAT) 0.4 MG SL tablet Place 0.4 mg under the tongue every 5 (five) minutes as needed for chest pain.   OVER THE COUNTER MEDICATION Take 1 tablet by mouth at bedtime. OTC sleep aid - unknown ingredients   OVER THE COUNTER MEDICATION Take 3,000 mcg by mouth daily.  Vitamin B12 3083mcg tablet daily   OXYGEN Inhale 2-4 L/min into the lungs See admin instructions. 2-3 lpm at rest and 4 lpm with exertion   OXYGEN Inhale 6 L into the lungs continuous.   pantoprazole (PROTONIX) 40 MG tablet Take 30-60 min before first meal of the day (Patient taking differently: Take 40 mg by mouth daily before breakfast.)   pantoprazole (PROTONIX) 40 MG tablet Take 40 mg by mouth daily.   Polyvinyl Alcohol-Povidone (REFRESH OP) Place 1 drop into both eyes daily as needed (dry eyes).   predniSONE (DELTASONE) 10 MG tablet TAKE 2 TABLETS BY MOUTH ONCE DAILY UNTIL FEELING BETTER, THEN TAKE 1 TABLET BY MOUTH ONCE DAILY   sertraline (ZOLOFT) 100 MG tablet Take 100 mg by mouth daily.                             Objective:  Physical Exam   02/16/2021  158 06/02/2020     170  12/03/2019   162 03/29/2019     164  08/08/2018   154 10/28/2014        160 > 12/09/2014 161 > 03/14/2015 158 >    04/25/2015  161 >  06/20/2015  158 > 09/22/2015   159 > 11/19/2015 159 > 12/23/2015   159  > 02/03/2016    163  > 05/18/2016   161 > 11/17/2016   154 > 01/07/2017  157 > 04/18/2017   148 > 07/19/2017  145 > 10/19/2017    150  > 11/16/2017   150 >  01/11/2018   150> 02/17/2018  150     10/10/14 160 lb (72.576 kg)  09/16/14 156 lb (70.761 kg)  09/04/14 160 lb 13.6 oz (72.96 kg)    Vital signs reviewed  02/16/2021  - Note at rest 02 sats  99% on 5lpm cont   General appearance:    chronically ill w/c bound elderly cushingnoid bf nad at rest     HEENT : pt wearing mask not removed for exam due to covid -19 concerns.    NECK :  without JVD/Nodes/TM/ nl carotid upstrokes bilaterally   LUNGS: no acc muscle use,  Mod barrel  contour chest wall with bilateral  Distant bs s audible wheeze and  without cough on insp or exp maneuvers and mod  Hyperresonant  to  percussion bilaterally     CV:  RRR  no s3 or murmur or increase in P2, and no edema   ABD:  soft and nontender with pos mid insp Hoover's  in the  supine position. No bruits or organomegaly appreciated, bowel sounds nl  MS:     ext warm without deformities, calf tenderness, cyanosis or clubbing No obvious joint restrictions   SKIN: warm and dry without lesions    NEURO:  alert, approp, nl sensorium with  no motor or cerebellar deficits apparent.                Assessment:

## 2021-02-16 ENCOUNTER — Ambulatory Visit (INDEPENDENT_AMBULATORY_CARE_PROVIDER_SITE_OTHER): Payer: Medicare PPO | Admitting: Internal Medicine

## 2021-02-16 ENCOUNTER — Encounter: Payer: Self-pay | Admitting: Internal Medicine

## 2021-02-16 ENCOUNTER — Other Ambulatory Visit: Payer: Self-pay

## 2021-02-16 DIAGNOSIS — J449 Chronic obstructive pulmonary disease, unspecified: Secondary | ICD-10-CM

## 2021-02-16 DIAGNOSIS — J9611 Chronic respiratory failure with hypoxia: Secondary | ICD-10-CM | POA: Diagnosis not present

## 2021-02-16 NOTE — Patient Instructions (Signed)
Try prednisone 20 mg alternating with 10 mg daily if tolerate   Please schedule a follow up visit in 3 months but call sooner if needed

## 2021-02-17 ENCOUNTER — Encounter: Payer: Self-pay | Admitting: Internal Medicine

## 2021-02-17 NOTE — Assessment & Plan Note (Signed)
Started on 02 2lpm at d/c 09/04/14  - 10/28/2014   Walked RA x one lap @ 185 stopped due to  desat to 82%  at nl pace so rec 02 2lpm with walking and sleeping  - 12/09/2014 referred for POC > did not qualify - 08/17/2016 Patient Saturations on Room Air at Rest = 85%---increased to 93% on 2lpm o2 - 04/18/2017  Walking across exam room sats dropped into 80's on 2lpm  - 11/16/2017   Walked RA x one lap @ 185 stopped due to 87% and even on 5lpm could not maintain sats at nl pace  -  01/11/2018 humidfy 02  Added > not using as of 05/08/2018 > rec she do so > improved on humidity - 03/29/2019  Required up to 6lpm with activity to complete about 200 ft walking   Again advised: Make sure you check your oxygen saturation  AT  your highest level of activity (not after you stop)   to be sure it stays over 90% and adjust  02 flow upward to maintain this level if needed but remember to turn it back to previous settings when you stop (to conserve your supply).          Each maintenance medication was reviewed in detail including emphasizing most importantly the difference between maintenance and prns and under what circumstances the prns are to be triggered using an action plan format where appropriate.  Total time for H and P, chart review, counseling, reviewing hfa/dpi/02 device(s) and generating customized AVS unique to this office visit / same day charting = 24 min

## 2021-02-17 NOTE — Assessment & Plan Note (Signed)
Quit smoking 08/2014 - PFT's 12/09/14   FEV1 0.75 (44 % ) ratio 62  p no % improvement from saba p ? prior to study with DLCO  24 % corrects to 48 % for alv volume    - 12/09/2014 referred to rehab > started 12/2014  - 04/25/2015   try BEVESPI  - 06/20/2015  Changed by insurance> back on symb 160  - 09/22/2015 not happy with symbicort > changed back to besvespi 2bid > not happy with bevespi > changed back to symb 160 2bid and consider adding spiriva next vs trelegy trial   - 02/17/2018  After extensive coaching inhaler device,  effectiveness =    50% with hfa and 90% with elipta so rec changed to trelegy   - 05/08/2018 added pred x 6 days as "plan D"  - 01/15/2019 changed pred to 20 mg until better then 10 mg daily  - 03/29/2019  After extensive coaching inhaler device,  effectiveness =    50% with hfa  - 12/03/2019 changed to ceiling of 20 mg pred/ floor of 5 mg per day - 02/16/2021 chagned pred 20 mg ceiling and floor of 10 mg as flared on 5 consistently   Unfortunately approaching endstage dz with nothing else to offer at this point for Group D / steroid dep copd > trelegy and approp saba reviewed as well as "lowest dose that works" for steroid rx

## 2021-03-03 ENCOUNTER — Other Ambulatory Visit: Payer: Self-pay | Admitting: Internal Medicine

## 2021-03-11 DIAGNOSIS — J42 Unspecified chronic bronchitis: Secondary | ICD-10-CM | POA: Diagnosis not present

## 2021-03-13 ENCOUNTER — Ambulatory Visit (HOSPITAL_COMMUNITY): Payer: Medicare PPO

## 2021-03-13 ENCOUNTER — Inpatient Hospital Stay: Payer: Medicare PPO | Attending: Internal Medicine

## 2021-03-16 ENCOUNTER — Inpatient Hospital Stay: Payer: Medicare PPO | Admitting: Internal Medicine

## 2021-03-22 ENCOUNTER — Emergency Department (HOSPITAL_COMMUNITY): Payer: Medicare PPO

## 2021-03-22 ENCOUNTER — Emergency Department (HOSPITAL_COMMUNITY)
Admission: EM | Admit: 2021-03-22 | Discharge: 2021-03-22 | Disposition: A | Discharge status: Home / Self Care | Payer: Medicare PPO | Attending: Emergency Medicine | Admitting: Emergency Medicine

## 2021-03-22 ENCOUNTER — Other Ambulatory Visit: Payer: Self-pay

## 2021-03-22 ENCOUNTER — Encounter (HOSPITAL_COMMUNITY): Payer: Self-pay

## 2021-03-22 DIAGNOSIS — Z7952 Long term (current) use of systemic steroids: Secondary | ICD-10-CM | POA: Insufficient documentation

## 2021-03-22 DIAGNOSIS — Z7951 Long term (current) use of inhaled steroids: Secondary | ICD-10-CM | POA: Diagnosis not present

## 2021-03-22 DIAGNOSIS — J449 Chronic obstructive pulmonary disease, unspecified: Secondary | ICD-10-CM | POA: Insufficient documentation

## 2021-03-22 DIAGNOSIS — Z7982 Long term (current) use of aspirin: Secondary | ICD-10-CM | POA: Diagnosis not present

## 2021-03-22 DIAGNOSIS — M545 Low back pain, unspecified: Secondary | ICD-10-CM | POA: Diagnosis not present

## 2021-03-22 DIAGNOSIS — M62838 Other muscle spasm: Secondary | ICD-10-CM

## 2021-03-22 DIAGNOSIS — R0602 Shortness of breath: Secondary | ICD-10-CM | POA: Diagnosis not present

## 2021-03-22 DIAGNOSIS — S22070A Wedge compression fracture of T9-T10 vertebra, initial encounter for closed fracture: Secondary | ICD-10-CM | POA: Diagnosis not present

## 2021-03-22 DIAGNOSIS — R069 Unspecified abnormalities of breathing: Secondary | ICD-10-CM | POA: Diagnosis not present

## 2021-03-22 DIAGNOSIS — M4856XA Collapsed vertebra, not elsewhere classified, lumbar region, initial encounter for fracture: Secondary | ICD-10-CM

## 2021-03-22 DIAGNOSIS — R0902 Hypoxemia: Secondary | ICD-10-CM | POA: Diagnosis not present

## 2021-03-22 DIAGNOSIS — M62830 Muscle spasm of back: Secondary | ICD-10-CM | POA: Diagnosis not present

## 2021-03-22 DIAGNOSIS — I959 Hypotension, unspecified: Secondary | ICD-10-CM | POA: Diagnosis not present

## 2021-03-22 DIAGNOSIS — R000 Tachycardia, unspecified: Secondary | ICD-10-CM | POA: Diagnosis not present

## 2021-03-22 DIAGNOSIS — R0689 Other abnormalities of breathing: Secondary | ICD-10-CM | POA: Diagnosis not present

## 2021-03-22 LAB — I-STAT VENOUS BLOOD GAS, ED
Acid-Base Excess: 4 mmol/L — ABNORMAL HIGH (ref 0.0–2.0)
Bicarbonate: 29.3 mmol/L — ABNORMAL HIGH (ref 20.0–28.0)
Calcium, Ion: 1.2 mmol/L (ref 1.15–1.40)
HCT: 30 % — ABNORMAL LOW (ref 36.0–46.0)
Hemoglobin: 10.2 g/dL — ABNORMAL LOW (ref 12.0–15.0)
O2 Saturation: 91 %
Potassium: 4.1 mmol/L (ref 3.5–5.1)
Sodium: 145 mmol/L (ref 135–145)
TCO2: 31 mmol/L (ref 22–32)
pCO2, Ven: 48.8 mmHg (ref 44–60)
pH, Ven: 7.386 (ref 7.25–7.43)
pO2, Ven: 64 mmHg — ABNORMAL HIGH (ref 32–45)

## 2021-03-22 LAB — CBC WITH DIFFERENTIAL/PLATELET
Abs Immature Granulocytes: 0.16 10*3/uL — ABNORMAL HIGH (ref 0.00–0.07)
Basophils Absolute: 0 10*3/uL (ref 0.0–0.1)
Basophils Relative: 0 %
Eosinophils Absolute: 0.1 10*3/uL (ref 0.0–0.5)
Eosinophils Relative: 1 %
HCT: 31.3 % — ABNORMAL LOW (ref 36.0–46.0)
Hemoglobin: 9.5 g/dL — ABNORMAL LOW (ref 12.0–15.0)
Immature Granulocytes: 2 %
Lymphocytes Relative: 9 %
Lymphs Abs: 1 10*3/uL (ref 0.7–4.0)
MCH: 31.5 pg (ref 26.0–34.0)
MCHC: 30.4 g/dL (ref 30.0–36.0)
MCV: 103.6 fL — ABNORMAL HIGH (ref 80.0–100.0)
Monocytes Absolute: 0.6 10*3/uL (ref 0.1–1.0)
Monocytes Relative: 6 %
Neutro Abs: 9 10*3/uL — ABNORMAL HIGH (ref 1.7–7.7)
Neutrophils Relative %: 82 %
Platelets: 152 10*3/uL (ref 150–400)
RBC: 3.02 MIL/uL — ABNORMAL LOW (ref 3.87–5.11)
RDW: 13.3 % (ref 11.5–15.5)
WBC: 10.9 10*3/uL — ABNORMAL HIGH (ref 4.0–10.5)
nRBC: 0 % (ref 0.0–0.2)

## 2021-03-22 LAB — BASIC METABOLIC PANEL
Anion gap: 8 (ref 5–15)
BUN: 25 mg/dL — ABNORMAL HIGH (ref 8–23)
CO2: 25 mmol/L (ref 22–32)
Calcium: 9.1 mg/dL (ref 8.9–10.3)
Chloride: 109 mmol/L (ref 98–111)
Creatinine, Ser: 1.17 mg/dL — ABNORMAL HIGH (ref 0.44–1.00)
GFR, Estimated: 49 mL/min — ABNORMAL LOW (ref >60)
Glucose, Bld: 171 mg/dL — ABNORMAL HIGH (ref 70–99)
Potassium: 4.2 mmol/L (ref 3.5–5.1)
Sodium: 142 mmol/L (ref 135–145)

## 2021-03-22 LAB — MAGNESIUM: Magnesium: 1.8 mg/dL (ref 1.7–2.4)

## 2021-03-22 MED ORDER — ACETAMINOPHEN 500 MG PO TABS: 500.0000 mg | ORAL_TABLET | Freq: Four times a day (QID) | ORAL | 0 refills | Status: AC | PRN

## 2021-03-22 MED ORDER — IPRATROPIUM BROMIDE 0.02 % IN SOLN
0.5000 mg | Freq: Once | RESPIRATORY_TRACT | Status: AC
Start: 2021-03-22 — End: 2021-03-22
  Administered 2021-03-22: 0.5 mg via RESPIRATORY_TRACT
  Filled 2021-03-22: qty 2.5

## 2021-03-22 MED ORDER — ACETAMINOPHEN 500 MG PO TABS: 1000.0000 mg | ORAL_TABLET | Freq: Four times a day (QID) | ORAL | Status: DC | PRN

## 2021-03-22 MED ORDER — NAPROXEN 250 MG PO TABS
250.0000 mg | ORAL_TABLET | Freq: Once | ORAL | Status: AC
  Administered 2021-03-22: 250 mg via ORAL
  Filled 2021-03-22: qty 1

## 2021-03-22 MED ORDER — LIDOCAINE 4 % EX PTCH: 1.0000 | MEDICATED_PATCH | Freq: Two times a day (BID) | CUTANEOUS | 0 refills | Status: DC

## 2021-03-22 MED ORDER — TIZANIDINE HCL 4 MG PO TABS: 4.0000 mg | ORAL_TABLET | Freq: Three times a day (TID) | ORAL | 0 refills | Status: DC | PRN

## 2021-03-22 MED ORDER — NAPROXEN 250 MG PO TABS: 250.0000 mg | ORAL_TABLET | Freq: Two times a day (BID) | ORAL | 0 refills | Status: DC

## 2021-03-22 MED ORDER — LIDOCAINE 5 % EX PTCH
1.0000 | MEDICATED_PATCH | CUTANEOUS | Status: DC
  Administered 2021-03-22: 1 via TRANSDERMAL
  Filled 2021-03-22: qty 1

## 2021-03-22 MED ORDER — METHOCARBAMOL 500 MG PO TABS
500.0000 mg | ORAL_TABLET | Freq: Once | ORAL | Status: AC
  Administered 2021-03-22: 500 mg via ORAL
  Filled 2021-03-22: qty 1

## 2021-03-22 MED ORDER — ALBUTEROL SULFATE (2.5 MG/3ML) 0.083% IN NEBU
2.5000 mg | INHALATION_SOLUTION | Freq: Once | RESPIRATORY_TRACT | Status: AC
  Administered 2021-03-22: 2.5 mg via RESPIRATORY_TRACT
  Filled 2021-03-22: qty 3

## 2021-03-22 NOTE — ED Notes (Signed)
Pt was screaming while being rolled down the hall stating to, "calm me down calm me down." RN, NT, and husband assisted pt with calming down by encouraging pt to breath through her nose and out her mouth.

## 2021-03-22 NOTE — ED Provider Notes (Signed)
[facility] EMERGENCY DEPARTMENT Provider Note   CSN: [id number] Arrival date & time: 03/22/21  0848     History  Chief Complaint  Patient presents with   Back Pain   Shortness of Breath    Suzanne Dickson is a 76 y.o. female.  HPI     76 year old female comes in with chief complaint of back pain and shortness of breath.  She has history of advanced COPD and is on 4 to 6 L of oxygen at home.  She indicates that her shortness of breath is primarily because of her pain in the back.  She does not have any new cough, wheezing.  The pain in her back started in November.  The pain is intermittent, but in the last couple of days the pain has become more severe.  2 days ago she was leaning forward to grab something from a box on the floor, when she heard a pop followed by pain.  The pain has been moderately severe ever since.  Home Medications Prior to Admission medications   Medication Sig Start Date End Date Taking? Authorizing Provider  acetaminophen (TYLENOL) 500 MG tablet Take 1 tablet (500 mg total) by mouth every 6 (six) hours as needed. 03/22/21  Yes Yitzchok Carriger, MD  Lidocaine 4 % PTCH Apply 1 patch topically 2 (two) times daily. 03/22/21  Yes Varney Biles, MD  naproxen (NAPROSYN) 250 MG tablet Take 1 tablet (250 mg total) by mouth 2 (two) times daily with a meal. 03/22/21  Yes Trip Cavanagh, MD  tiZANidine (ZANAFLEX) 4 MG tablet Take 1 tablet (4 mg total) by mouth every 8 (eight) hours as needed for muscle spasms. 03/22/21  Yes Varney Biles, MD  albuterol (PROVENTIL) (2.5 MG/3ML) 0.083% nebulizer solution Use 26mLs every 4 hours and as needed for shortness of breath. 11/07/20   Tanda Rockers, MD  albuterol (VENTOLIN HFA) 108 (90 Base) MCG/ACT inhaler INHALE 2 PUFFS BY MOUTH EVERY 4 HOURS AS NEEDED FOR WHEEZING FOR SHORTNESS OF BREATH 12/26/20   Tanda Rockers, MD  amLODipine (NORVASC) 5 MG tablet Take 5 mg by mouth daily.    [provider]   aspirin EC 81 MG tablet Take 81 mg by mouth daily. Swallow whole.    [provider]  atorvastatin (LIPITOR) 20 MG tablet Take 20 mg by mouth at bedtime. 08/21/20   [provider]  Azelastine-Fluticasone 137-50 MCG/ACT SUSP 2 PUFFS INTO EACH NOSTRIL AT BEDTIME 04/22/20   Tanda Rockers, MD  benzonatate (TESSALON) 200 MG capsule Take 1 capsule (200 mg total) by mouth 3 (three) times daily as needed for cough. 09/22/20   Bonnielee Haff, MD  cetirizine (ZYRTEC) 10 MG tablet Take 10 mg by mouth at bedtime.    [provider]  cholecalciferol (VITAMIN D) 25 MCG (1000 UNIT) tablet Take 1,000 Units by mouth daily.    [provider]  cholecalciferol (VITAMIN D3) 25 MCG (1000 UNIT) tablet Take 1,000 Units by mouth daily.    [provider]  clopidogrel (PLAVIX) 75 MG tablet Take 75 mg by mouth every morning.     [provider]  clopidogrel (PLAVIX) 75 MG tablet Take 75 mg by mouth daily.    [provider]  Cyanocobalamin (B-12 PO) Take 1 tablet by mouth daily.    [provider]  famotidine (PEPCID) 20 MG tablet One after supper 02/12/19   Tanda Rockers, MD  ferrous sulfate 325 (65 FE) MG EC tablet Take  325 mg by mouth 3 (three) times daily with meals.     [provider]  FERROUS SULFATE PO Take 3 tablets by mouth daily at 2 PM. Unsure of strength    [provider]  Fluticasone-Umeclidin-Vilant (TRELEGY ELLIPTA) 100-62.5-25 MCG/INH AEPB Inhale 1 puff into the lungs daily.    [provider]  hydrochlorothiazide (MICROZIDE) 12.5 MG capsule Take 12.5 mg by mouth daily.    [provider]  ibuprofen (ADVIL) 200 MG tablet Take 400 mg by mouth every 6 (six) hours as needed (pain).    [provider]  losartan (COZAAR) 50 MG tablet Take 50 mg by mouth every morning. 11/15/18   [provider]  losartan (COZAAR) 50 MG tablet Take 50 mg by mouth daily.    [provider]   metoprolol succinate (TOPROL-XL) 25 MG 24 hr tablet Take 25 mg by mouth daily.    [provider]  nitroGLYCERIN (NITRODUR - DOSED IN MG/24 HR) 0.4 mg/hr patch Place 0.4 mg onto the skin every morning.  Patient not taking: Reported on 02/16/2021 08/10/17   [provider]  nitroGLYCERIN (NITROSTAT) 0.4 MG SL tablet Place 0.4 mg under the tongue every 5 (five) minutes as needed for chest pain.    [provider]  OVER THE COUNTER MEDICATION Take 1 tablet by mouth at bedtime. OTC sleep aid - unknown ingredients    [provider]  OVER THE COUNTER MEDICATION Take 3,000 mcg by mouth daily. Vitamin B12 3011mcg tablet daily    [provider]  OXYGEN Inhale 2-4 L/min into the lungs See admin instructions. 2-3 lpm at rest and 4 lpm with exertion    [provider]  OXYGEN Inhale 6 L into the lungs continuous.    [provider]  pantoprazole (PROTONIX) 40 MG tablet Take 30-60 min before first meal of the day Patient taking differently: Take 40 mg by mouth daily before breakfast. 08/08/18   Tanda Rockers, MD  pantoprazole (PROTONIX) 40 MG tablet Take 40 mg by mouth daily.    [provider]  Polyvinyl Alcohol-Povidone (REFRESH OP) Place 1 drop into both eyes daily as needed (dry eyes).    [provider]  predniSONE (DELTASONE) 10 MG tablet TAKE 2 TABLETS BY MOUTH ONCE DAILY UNTIL  FEELING  BETTER  THEN  TAKE  ONE  TABLET  DAILY 03/03/21   Tanda Rockers, MD  sertraline (ZOLOFT) 100 MG tablet Take 100 mg by mouth daily.    [provider]      Allergies    Patient has no known allergies.    Review of Systems   Review of Systems  All other systems reviewed and are negative.  Physical Exam Updated Vital Signs BP 127/68    Pulse 99    Temp 98.3 F (36.8 C) (Oral)    Resp (!) 21    Ht 5\' 2"  (1.575 m)    Wt 71.7 kg    SpO2 96%    BMI 28.90 kg/m  Physical Exam Vitals and nursing note reviewed.  Constitutional:       Appearance: She is well-developed.  HENT:     Head: Atraumatic.  Cardiovascular:     Rate and Rhythm: Normal rate.  Pulmonary:     Effort: Pulmonary effort is normal.     Breath sounds: No decreased breath sounds, wheezing, rhonchi or rales.  Musculoskeletal:     Cervical back: Normal range of motion and neck supple.  Comments: Pt has tenderness diffusely over the lumbar region, more so in the paraspinal region on the left side. No step offs, no erythema -but there is diffuse focal tenderness over the midline spine as well. Able to discriminate between sharp and dull. Able to ambulate   Skin:    General: Skin is warm and dry.  Neurological:     Mental Status: She is alert and oriented to person, place, and time.    ED Results / Procedures / Treatments   Labs (all labs ordered are listed, but only abnormal results are displayed) Labs Reviewed  BASIC METABOLIC PANEL - Abnormal; Notable for the following components:      Result Value   Glucose, Bld 171 (*)    BUN 25 (*)    Creatinine, Ser 1.17 (*)    GFR, Estimated 49 (*)    All other components within normal limits  CBC WITH DIFFERENTIAL/PLATELET - Abnormal; Notable for the following components:   WBC 10.9 (*)    RBC 3.02 (*)    Hemoglobin 9.5 (*)    HCT 31.3 (*)    MCV 103.6 (*)    Neutro Abs 9.0 (*)    Abs Immature Granulocytes 0.16 (*)    All other components within normal limits  I-STAT VENOUS BLOOD GAS, ED - Abnormal; Notable for the following components:   pO2, Ven 64 (*)    Bicarbonate 29.3 (*)    Acid-Base Excess 4.0 (*)    HCT 30.0 (*)    Hemoglobin 10.2 (*)    All other components within normal limits  MAGNESIUM    EKG EKG Interpretation  Date/Time:  Sunday March 22 2021 10:57:59 EST Ventricular Rate:  114 PR Interval:  170 QRS Duration: 67 QT Interval:  378 QTC Calculation: 521 R Axis:   68 Text Interpretation: Sinus tachycardia Borderline repolarization abnormality Prolonged QT  interval No acute changes No significant change since last tracing Confirmed by Varney Biles [phone]) on 03/22/2021 12:43:09 PM  Radiology DG Lumbar Spine Complete  Result Date: 03/22/2021 CLINICAL DATA:  Low back pain.  Chronic steroid use. EXAM: LUMBAR SPINE - COMPLETE 4+ VIEW COMPARISON:  None. FINDINGS: Mild superior endplate compression deformities are seen involving the L1, L2, L3, and L4 vertebral bodies. These are of indeterminate age radiographically. Intervertebral disc spaces are maintained. Generalized osteopenia noted. No focal lytic or sclerotic bone lesions identified. Aortic atherosclerotic calcification noted. IMPRESSION: Mild superior endplate compression fractures of L1, L2, L3, and L4 vertebral bodies, of indeterminate age. Osteopenia. Electronically Signed   By: Marlaine Hind M.D.   On: 03/22/2021 12:10    Procedures Procedures    Medications Ordered in ED Medications  lidocaine (LIDODERM) 5 % 1 patch (1 patch Transdermal Patch Applied 03/22/21 1048)  acetaminophen (TYLENOL) tablet 1,000 mg (has no administration in time range)  albuterol (PROVENTIL) (2.5 MG/3ML) 0.083% nebulizer solution 2.5 mg (2.5 mg Nebulization Given 03/22/21 1055)  ipratropium (ATROVENT) nebulizer solution 0.5 mg (0.5 mg Nebulization Given 03/22/21 1055)  naproxen (NAPROSYN) tablet 250 mg (250 mg Oral Given 03/22/21 1051)  methocarbamol (ROBAXIN) tablet 500 mg (500 mg Oral Given 03/22/21 1051)    ED Course/ Medical Decision Making/ A&P Clinical Course as of 03/22/21 1418  Sun Mar 22, 2021  1417 DG Lumbar Spine Complete Results of the work-up discussed with the patient.  It is unclear if the compression fracture is acute, occurred 2 days ago or the in November-December time when she initially started having pain.  On reassessment, patient  does indicate that she feels better.  Nurse has already had patient ambulate now.  I do not think that TLSO is needed at this point.  We will advise her to follow-up  with PCP.  She will be discharged with medications for symptom control.  Depending on patient's response , PCP can decide if patient needs physical therapy, better pain management, TLSO or consultation with a spine surgeon. [AN]    Clinical Course User Index [AN] Varney Biles, MD                           Medical Decision Making Amount and/or Complexity of Data Reviewed Labs: ordered. Radiology: ordered.  Risk OTC drugs. Prescription drug management.    This patient presents to the ED with chief complaint(s) of back pain, shortness of breath with pertinent past medical history of advanced COPD which further complicates the presenting complaint. The complaint involves an extensive differential diagnosis and treatment options and also carries with it a high risk of complications and morbidity.  Exam reveals diffuse lumbar spine tenderness, including in the paraspinal region.  The differential diagnosis includes lumbar strain, muscle spasm pain, pathologic fracture of the lumbar spine. From the perspective of her shortness of breath, primary issue appears to be her pain.  On exam she did not have any significant wheezing.  We will give her nebulizer treatment here and get an x-ray.  My suspicion is that she really does not have COPD exacerbation that needs treatment.  The initial plan is to focus on pain control.  Basic labs ordered to ensure there is no evidence of leukocytosis, anemia.  Lumbar spine x-ray ordered.  I reviewed patient's visit with her PCP, it does appear that she is always on high flow chronic oxygen.  She is also seeing a pulmonologist.    Final Clinical Impression(s) / ED Diagnoses Final diagnoses:  Nontraumatic compression fracture of L4 vertebra, initial encounter ([place])  Muscle spasm    Rx / DC Orders ED Discharge Orders          Ordered    Lidocaine 4 % PTCH  2 times daily        03/22/21 1410    tiZANidine (ZANAFLEX) 4 MG tablet  Every 8 hours PRN         03/22/21 1410    acetaminophen (TYLENOL) 500 MG tablet  Every 6 hours PRN        03/22/21 1410    naproxen (NAPROSYN) 250 MG tablet  2 times daily with meals        03/22/21 Suzanne Dickson, Suzanne Jernberg, MD 03/22/21 1418

## 2021-03-22 NOTE — ED Triage Notes (Signed)
Pt bib ems from home with c/o back spasms in lower back and sob. Pt usually takes Tylenol for back pain but medication has not been working. Pt baseline is 4L-6L O2. HX COPD

## 2021-03-22 NOTE — ED Notes (Signed)
Pt O2 6L adjusted back to 4L pt O2 94%

## 2021-03-22 NOTE — Discharge Instructions (Addendum)
We saw in the ER for muscle spasms. As discussed, there is evidence of old compression fractures on your x-ray.  Take the medications provided.  Apply warm compresses every 2 to 4 hours for 15 minutes and follow-up with your primary care doctor in 7 to 10 days.

## 2021-03-22 NOTE — ED Notes (Signed)
RN and NT help dress the pt

## 2021-03-22 NOTE — ED Notes (Signed)
Pt was able to sit at the edge of bed. Pt was able to stand and pivot with walker, but pt stated she still have some spasms.  Pt said she would like to remain sitting on the side of bed.

## 2021-03-22 NOTE — ED Notes (Signed)
Pt was crying in bed and stating she is in pain and has anxiety. RN adjusted pt O2 6L. RN encouraged pt to take deep breaths. Pt stated husband should be in shortly.  EDP will be in soon.   Pt is now sleep in bed.

## 2021-03-22 NOTE — ED Notes (Signed)
RN and NT provided peri-care and changed pt linens and brief.

## 2021-03-23 ENCOUNTER — Other Ambulatory Visit: Payer: Self-pay

## 2021-03-23 DIAGNOSIS — C3490 Malignant neoplasm of unspecified part of unspecified bronchus or lung: Secondary | ICD-10-CM

## 2021-03-27 ENCOUNTER — Telehealth: Payer: Self-pay

## 2021-03-27 NOTE — Telephone Encounter (Signed)
Suzanne Lex, Suzanne Dickson w/[employer] called stating the pt advised her she was seen in the ER and dx with compression fractures of L1, L2, L3, and L4 and wanted Dr. Julien Nordmann to be aware in the event it is metastatic ca. ? ?Suzanne Dickson was advised pt missed her last appt and cancelled there CT scan but has been r/s on 04/20/21 and strongly encouraged to keep her appts and to r/s her CT scan. Suzanne Dickson is aware that pt was provided the contact number to schedule her CT. ?

## 2021-04-03 ENCOUNTER — Telehealth: Payer: Self-pay | Admitting: Internal Medicine

## 2021-04-03 NOTE — Telephone Encounter (Signed)
.  Called patient to schedule appointment per 2/25 inbasket, patient is aware of date and time.   ?

## 2021-04-15 ENCOUNTER — Other Ambulatory Visit: Payer: Self-pay | Admitting: Medical Oncology

## 2021-04-15 DIAGNOSIS — C3490 Malignant neoplasm of unspecified part of unspecified bronchus or lung: Secondary | ICD-10-CM

## 2021-04-17 ENCOUNTER — Encounter (HOSPITAL_COMMUNITY): Payer: Self-pay

## 2021-04-17 ENCOUNTER — Inpatient Hospital Stay: Payer: Medicare PPO | Attending: Internal Medicine

## 2021-04-17 ENCOUNTER — Ambulatory Visit (HOSPITAL_COMMUNITY)
Admission: RE | Admit: 2021-04-17 | Discharge: 2021-04-17 | Disposition: A | Discharge status: Home / Self Care | Payer: Medicare PPO | Source: Ambulatory Visit | Attending: Internal Medicine | Admitting: Internal Medicine

## 2021-04-17 ENCOUNTER — Other Ambulatory Visit: Payer: Self-pay

## 2021-04-17 DIAGNOSIS — C3412 Malignant neoplasm of upper lobe, left bronchus or lung: Secondary | ICD-10-CM | POA: Insufficient documentation

## 2021-04-17 DIAGNOSIS — M8580 Other specified disorders of bone density and structure, unspecified site: Secondary | ICD-10-CM | POA: Insufficient documentation

## 2021-04-17 DIAGNOSIS — J432 Centrilobular emphysema: Secondary | ICD-10-CM | POA: Diagnosis not present

## 2021-04-17 DIAGNOSIS — C3490 Malignant neoplasm of unspecified part of unspecified bronchus or lung: Secondary | ICD-10-CM | POA: Insufficient documentation

## 2021-04-17 DIAGNOSIS — R0609 Other forms of dyspnea: Secondary | ICD-10-CM | POA: Insufficient documentation

## 2021-04-17 DIAGNOSIS — Z9049 Acquired absence of other specified parts of digestive tract: Secondary | ICD-10-CM | POA: Insufficient documentation

## 2021-04-17 DIAGNOSIS — R918 Other nonspecific abnormal finding of lung field: Secondary | ICD-10-CM | POA: Diagnosis not present

## 2021-04-17 DIAGNOSIS — R5383 Other fatigue: Secondary | ICD-10-CM | POA: Insufficient documentation

## 2021-04-17 DIAGNOSIS — N281 Cyst of kidney, acquired: Secondary | ICD-10-CM | POA: Insufficient documentation

## 2021-04-17 DIAGNOSIS — Z923 Personal history of irradiation: Secondary | ICD-10-CM | POA: Insufficient documentation

## 2021-04-17 DIAGNOSIS — G4733 Obstructive sleep apnea (adult) (pediatric): Secondary | ICD-10-CM | POA: Insufficient documentation

## 2021-04-17 DIAGNOSIS — Z79899 Other long term (current) drug therapy: Secondary | ICD-10-CM | POA: Insufficient documentation

## 2021-04-17 DIAGNOSIS — K449 Diaphragmatic hernia without obstruction or gangrene: Secondary | ICD-10-CM | POA: Insufficient documentation

## 2021-04-17 DIAGNOSIS — J449 Chronic obstructive pulmonary disease, unspecified: Secondary | ICD-10-CM | POA: Insufficient documentation

## 2021-04-17 DIAGNOSIS — I700 Atherosclerosis of aorta: Secondary | ICD-10-CM | POA: Insufficient documentation

## 2021-04-17 LAB — CMP (CANCER CENTER ONLY)
ALT: 18 U/L (ref 0–44)
AST: 16 U/L (ref 15–41)
Albumin: 4.1 g/dL (ref 3.5–5.0)
Alkaline Phosphatase: 98 U/L (ref 38–126)
Anion gap: 8 (ref 5–15)
BUN: 26 mg/dL — ABNORMAL HIGH (ref 8–23)
CO2: 25 mmol/L (ref 22–32)
Calcium: 9.6 mg/dL (ref 8.9–10.3)
Chloride: 108 mmol/L (ref 98–111)
Creatinine: 1.24 mg/dL — ABNORMAL HIGH (ref 0.44–1.00)
GFR, Estimated: 45 mL/min — ABNORMAL LOW (ref >60)
Glucose, Bld: 257 mg/dL — ABNORMAL HIGH (ref 70–99)
Potassium: 3.7 mmol/L (ref 3.5–5.1)
Sodium: 141 mmol/L (ref 135–145)
Total Bilirubin: 0.5 mg/dL (ref 0.3–1.2)
Total Protein: 6.7 g/dL (ref 6.5–8.1)

## 2021-04-17 LAB — CBC WITH DIFFERENTIAL (CANCER CENTER ONLY)
Abs Immature Granulocytes: 0.07 10*3/uL (ref 0.00–0.07)
Basophils Absolute: 0 10*3/uL (ref 0.0–0.1)
Basophils Relative: 0 %
Eosinophils Absolute: 0.1 10*3/uL (ref 0.0–0.5)
Eosinophils Relative: 1 %
HCT: 32.3 % — ABNORMAL LOW (ref 36.0–46.0)
Hemoglobin: 9.9 g/dL — ABNORMAL LOW (ref 12.0–15.0)
Immature Granulocytes: 1 %
Lymphocytes Relative: 10 %
Lymphs Abs: 0.8 10*3/uL (ref 0.7–4.0)
MCH: 31 pg (ref 26.0–34.0)
MCHC: 30.7 g/dL (ref 30.0–36.0)
MCV: 101.3 fL — ABNORMAL HIGH (ref 80.0–100.0)
Monocytes Absolute: 0.3 10*3/uL (ref 0.1–1.0)
Monocytes Relative: 3 %
Neutro Abs: 7.1 10*3/uL (ref 1.7–7.7)
Neutrophils Relative %: 85 %
Platelet Count: 169 10*3/uL (ref 150–400)
RBC: 3.19 MIL/uL — ABNORMAL LOW (ref 3.87–5.11)
RDW: 12.8 % (ref 11.5–15.5)
WBC Count: 8.3 10*3/uL (ref 4.0–10.5)
nRBC: 0 % (ref 0.0–0.2)

## 2021-04-20 ENCOUNTER — Inpatient Hospital Stay: Payer: Medicare PPO | Admitting: Internal Medicine

## 2021-04-20 ENCOUNTER — Other Ambulatory Visit: Payer: Self-pay

## 2021-04-20 VITALS — BP 155/65 | HR 94 | Temp 98.0°F | Resp 20 | Ht 62.0 in | Wt 153.2 lb

## 2021-04-20 DIAGNOSIS — M8580 Other specified disorders of bone density and structure, unspecified site: Secondary | ICD-10-CM | POA: Diagnosis not present

## 2021-04-20 DIAGNOSIS — I700 Atherosclerosis of aorta: Secondary | ICD-10-CM | POA: Diagnosis not present

## 2021-04-20 DIAGNOSIS — C3490 Malignant neoplasm of unspecified part of unspecified bronchus or lung: Secondary | ICD-10-CM | POA: Diagnosis not present

## 2021-04-20 DIAGNOSIS — N281 Cyst of kidney, acquired: Secondary | ICD-10-CM | POA: Diagnosis not present

## 2021-04-20 DIAGNOSIS — Z923 Personal history of irradiation: Secondary | ICD-10-CM | POA: Diagnosis not present

## 2021-04-20 DIAGNOSIS — K449 Diaphragmatic hernia without obstruction or gangrene: Secondary | ICD-10-CM | POA: Diagnosis not present

## 2021-04-20 DIAGNOSIS — R5383 Other fatigue: Secondary | ICD-10-CM | POA: Diagnosis not present

## 2021-04-20 DIAGNOSIS — C3412 Malignant neoplasm of upper lobe, left bronchus or lung: Secondary | ICD-10-CM | POA: Diagnosis not present

## 2021-04-20 DIAGNOSIS — C3492 Malignant neoplasm of unspecified part of left bronchus or lung: Secondary | ICD-10-CM

## 2021-04-20 DIAGNOSIS — Z79899 Other long term (current) drug therapy: Secondary | ICD-10-CM | POA: Diagnosis not present

## 2021-04-20 DIAGNOSIS — G4733 Obstructive sleep apnea (adult) (pediatric): Secondary | ICD-10-CM | POA: Diagnosis not present

## 2021-04-20 DIAGNOSIS — Z9049 Acquired absence of other specified parts of digestive tract: Secondary | ICD-10-CM | POA: Diagnosis not present

## 2021-04-20 DIAGNOSIS — R0609 Other forms of dyspnea: Secondary | ICD-10-CM | POA: Diagnosis not present

## 2021-04-20 DIAGNOSIS — J449 Chronic obstructive pulmonary disease, unspecified: Secondary | ICD-10-CM | POA: Diagnosis not present

## 2021-04-20 NOTE — Progress Notes (Signed)
?    [facility] ?[phone]   [phone] ? ?OFFICE PROGRESS NOTE ? ?[name], MD ?[address] [place] [address] ? ?DIAGNOSIS: Stage IA (T1a, N0, M0) non-small cell lung cancer, adenocarcinoma presented with left upper lobe lung nodule diagnosed in March 2018. ? ?PRIOR THERAPY: S/P RT to the left lung nodule under the care of Dr. Sondra Come completed on 05/04/2016. ? ?CURRENT THERAPY: Observation. ? ?INTERVAL HISTORY: ?Suzanne Dickson 76 y.o. female returns to the clinic today for annual follow-up visit accompanied by her husband.  The patient is feeling fine today with no concerning complaints except for the baseline shortness of breath.  She is currently on home oxygen.  She denied having any chest pain, cough or hemoptysis.  She has no nausea, vomiting, diarrhea or constipation.  She has no headache or visual changes.  She has no recent weight loss or night sweats.  The patient is here today for evaluation with repeat CT scan of the chest for restaging of her disease. ? ?MEDICAL HISTORY: ?Past Medical History:  ?Diagnosis Date  ? Adenocarcinoma of left lung, stage 1 ([place]) 04/15/2016  ? "I'm in remission" (04/05/2017)  ? Adenocarcinoma, lung ([place])   ? Anxiety   ? Arthritis   ? "legs, elbows" (04/05/2017)  ? Bronchitis, chronic ([place])   ? "haven't had it in awhile" (04/05/2017)  ? CHF (congestive heart failure) ([place])   ? COPD (chronic obstructive pulmonary disease) ([place])   ? Coronary artery disease   ? Daily headache   ? "recently" (04/05/2017)  ? Depression   ? Dyspnea   ? with exertion  ? GERD (gastroesophageal reflux disease)   ? History of blood transfusion 1950s  ? History of radiation therapy 05/04/16 - 05/12/16  ? Left lung treated to 54 Gy with 3 fx of 18 Gy  ? Hypertension   ? On home oxygen therapy   ? "2L; 24/7" (04/05/2017)  ? OSA (obstructive sleep apnea)   ? OSA on CPAP   ? Pneumonia 10/2015  ? "at least 3 times" (04/05/2017)  ? Pulmonary  hypertension ([place])   ? ? ?ALLERGIES:  has No Known Allergies. ? ?MEDICATIONS:  ?Current Outpatient Medications  ?Medication Sig Dispense Refill  ? acetaminophen (TYLENOL) 500 MG tablet Take 1 tablet (500 mg total) by mouth every 6 (six) hours as needed. 30 tablet 0  ? albuterol (PROVENTIL) (2.5 MG/3ML) 0.083% nebulizer solution Use 72mLs every 4 hours and as needed for shortness of breath. 180 mL 2  ? albuterol (VENTOLIN HFA) 108 (90 Base) MCG/ACT inhaler INHALE 2 PUFFS BY MOUTH EVERY 4 HOURS AS NEEDED FOR WHEEZING FOR SHORTNESS OF BREATH 18 g 3  ? amLODipine (NORVASC) 5 MG tablet Take 5 mg by mouth daily.    ? aspirin EC 81 MG tablet Take 81 mg by mouth daily. Swallow whole.    ? atorvastatin (LIPITOR) 20 MG tablet Take 20 mg by mouth at bedtime.    ? Azelastine-Fluticasone 137-50 MCG/ACT SUSP 2 PUFFS INTO EACH NOSTRIL AT BEDTIME 23 g 3  ? benzonatate (TESSALON) 200 MG capsule Take 1 capsule (200 mg total) by mouth 3 (three) times daily as needed for cough. 20 capsule 0  ? cetirizine (ZYRTEC) 10 MG tablet Take 10 mg by mouth at bedtime.    ? cholecalciferol (VITAMIN D) 25 MCG (1000 UNIT) tablet Take 1,000 Units by mouth daily.    ? cholecalciferol (VITAMIN D3) 25 MCG (1000 UNIT) tablet Take 1,000  Units by mouth daily.    ? clopidogrel (PLAVIX) 75 MG tablet Take 75 mg by mouth every morning.     ? clopidogrel (PLAVIX) 75 MG tablet Take 75 mg by mouth daily.    ? Cyanocobalamin (B-12 PO) Take 1 tablet by mouth daily.    ? famotidine (PEPCID) 20 MG tablet One after supper 30 tablet 11  ? ferrous sulfate 325 (65 FE) MG EC tablet Take 325 mg by mouth 3 (three) times daily with meals.     ? FERROUS SULFATE PO Take 3 tablets by mouth daily at 2 PM. Unsure of strength    ? Fluticasone-Umeclidin-Vilant (TRELEGY ELLIPTA) 100-62.5-25 MCG/INH AEPB Inhale 1 puff into the lungs daily.    ? hydrochlorothiazide (MICROZIDE) 12.5 MG capsule Take 12.5 mg by mouth daily.    ? ibuprofen (ADVIL) 200 MG tablet Take 400 mg by mouth every 6  (six) hours as needed (pain).    ? Lidocaine 4 % PTCH Apply 1 patch topically 2 (two) times daily. 12 patch 0  ? losartan (COZAAR) 50 MG tablet Take 50 mg by mouth every morning.    ? losartan (COZAAR) 50 MG tablet Take 50 mg by mouth daily.    ? metoprolol succinate (TOPROL-XL) 25 MG 24 hr tablet Take 25 mg by mouth daily.    ? naproxen (NAPROSYN) 250 MG tablet Take 1 tablet (250 mg total) by mouth 2 (two) times daily with a meal. 10 tablet 0  ? nitroGLYCERIN (NITRODUR - DOSED IN MG/24 HR) 0.4 mg/hr patch Place 0.4 mg onto the skin every morning.  (Patient not taking: Reported on 02/16/2021)  3  ? nitroGLYCERIN (NITROSTAT) 0.4 MG SL tablet Place 0.4 mg under the tongue every 5 (five) minutes as needed for chest pain.    ? OVER THE COUNTER MEDICATION Take 1 tablet by mouth at bedtime. OTC sleep aid - unknown ingredients    ? OVER THE COUNTER MEDICATION Take 3,000 mcg by mouth daily. Vitamin B12 3060mcg tablet daily    ? OXYGEN Inhale 2-4 L/min into the lungs See admin instructions. 2-3 lpm at rest and 4 lpm with exertion    ? OXYGEN Inhale 6 L into the lungs continuous.    ? pantoprazole (PROTONIX) 40 MG tablet Take 30-60 min before first meal of the day (Patient taking differently: Take 40 mg by mouth daily before breakfast.) 90 tablet 3  ? pantoprazole (PROTONIX) 40 MG tablet Take 40 mg by mouth daily.    ? Polyvinyl Alcohol-Povidone (REFRESH OP) Place 1 drop into both eyes daily as needed (dry eyes).    ? predniSONE (DELTASONE) 10 MG tablet TAKE 2 TABLETS BY MOUTH ONCE DAILY UNTIL  FEELING  BETTER  THEN  TAKE  ONE  TABLET  DAILY 100 tablet 0  ? sertraline (ZOLOFT) 100 MG tablet Take 100 mg by mouth daily.    ? tiZANidine (ZANAFLEX) 4 MG tablet Take 1 tablet (4 mg total) by mouth every 8 (eight) hours as needed for muscle spasms. 15 tablet 0  ? ?No current facility-administered medications for this visit.  ? ? ?SURGICAL HISTORY:  ?Past Surgical History:  ?Procedure Laterality Date  ? COLONOSCOPY    ? CORONARY  ANGIOPLASTY WITH STENT PLACEMENT  04/05/2017  ? CORONARY STENT INTERVENTION N/A 04/05/2017  ? Procedure: CORONARY STENT INTERVENTION;  Surgeon: [name], MD;  Location: [facility];  Service: Cardiovascular;  Laterality: N/A;  ? DILATION AND CURETTAGE OF UTERUS    ? FUDUCIAL PLACEMENT Left  03/31/2016  ? Procedure: PLACEMENT OF FUDUCIAL LEFT UPPER LOBE;  Surgeon: Collene Gobble, MD;  Location: [facility];  Service: Thoracic;  Laterality: Left;  ? LAPAROSCOPIC CHOLECYSTECTOMY    ? LEFT HEART CATH AND CORONARY ANGIOGRAPHY N/A 02/17/2017  ? Procedure: LEFT HEART CATH AND CORONARY ANGIOGRAPHY;  Surgeon: [name], MD;  Location: [facility];  Service: Cardiovascular;  Laterality: N/A;  ? SINUSOTOMY    ? TONSILLECTOMY    ? TUBAL LIGATION    ? VIDEO BRONCHOSCOPY WITH ENDOBRONCHIAL NAVIGATION N/A 03/31/2016  ? Procedure: VIDEO BRONCHOSCOPY WITH ENDOBRONCHIAL NAVIGATION;  Surgeon: Collene Gobble, MD;  Location: [facility];  Service: Thoracic;  Laterality: N/A;  ? ? ?REVIEW OF SYSTEMS:  A comprehensive review of systems was negative except for: Constitutional: positive for fatigue ?Respiratory: positive for dyspnea on exertion  ? ?PHYSICAL EXAMINATION: General appearance: alert, cooperative, fatigued, and no distress ?Head: Normocephalic, without obvious abnormality, atraumatic ?Neck: no adenopathy, no JVD, supple, symmetrical, trachea midline, and thyroid not enlarged, symmetric, no tenderness/mass/nodules ?Lymph nodes: Cervical, supraclavicular, and axillary nodes normal. ?Resp: clear to auscultation bilaterally ?Back: symmetric, no curvature. ROM normal. No CVA tenderness. ?Cardio: regular rate and rhythm, S1, S2 normal, no murmur, click, rub or gallop ?GI: soft, non-tender; bowel sounds normal; no masses,  no organomegaly ?Extremities: extremities normal, atraumatic, no cyanosis or edema ? ?ECOG PERFORMANCE STATUS: 1 - Symptomatic but completely ambulatory ? ?Blood pressure (!) 155/65, pulse 94, temperature 98 ?F  (36.7 ?C), temperature source Tympanic, resp. rate 20, height 5\' 2"  (1.575 m), weight 153 lb 4 oz (69.5 kg), SpO2 95 %. ? ?LABORATORY DATA: ?Lab Results  ?Component Value Date  ? WBC 8.3 04/17/2021  ? HGB 9.9 (L) 0

## 2021-05-07 ENCOUNTER — Other Ambulatory Visit: Payer: Self-pay | Admitting: Internal Medicine

## 2021-05-22 DIAGNOSIS — E785 Hyperlipidemia, unspecified: Secondary | ICD-10-CM | POA: Diagnosis not present

## 2021-05-22 DIAGNOSIS — G4733 Obstructive sleep apnea (adult) (pediatric): Secondary | ICD-10-CM | POA: Diagnosis not present

## 2021-05-22 DIAGNOSIS — I10 Essential (primary) hypertension: Secondary | ICD-10-CM | POA: Diagnosis not present

## 2021-05-22 DIAGNOSIS — I2510 Atherosclerotic heart disease of native coronary artery without angina pectoris: Secondary | ICD-10-CM | POA: Diagnosis not present

## 2021-05-25 ENCOUNTER — Encounter: Payer: Self-pay | Admitting: Internal Medicine

## 2021-05-25 ENCOUNTER — Ambulatory Visit: Payer: Medicare PPO | Admitting: Internal Medicine

## 2021-05-25 DIAGNOSIS — J449 Chronic obstructive pulmonary disease, unspecified: Secondary | ICD-10-CM | POA: Diagnosis not present

## 2021-05-25 DIAGNOSIS — J9611 Chronic respiratory failure with hypoxia: Secondary | ICD-10-CM | POA: Diagnosis not present

## 2021-05-25 NOTE — Progress Notes (Signed)
?Subjective:  ?  ? Patient ID: Suzanne Dickson, female   DOB: Sep 27, 1945     MRN: [id number] ? ?  ?Brief patient profile:  ?75 yobf quit smoking on admit 09/02/14  and proved to have GOLD III copd 11/2014 ?   ? ?History of Present Illness  ?09/22/2015  f/u ov/Suzanne Dickson re: copd III/ symb 1602bid on 2lpm at rest/ 4lpm ex  ?Chief Complaint  ?Patient presents with  ? Follow-up  ?  [place] had hemoptysis and increased SOB. She took round pred and augmentin and symptoms are some better. She has noticed minimal wheezing at night. She has used neb 3 x over the past 2 wks.   ?liked bevespi better but wasn't covered then and not doing as well on the symbicort though hfa not ideal  - see a/p ?rec ?Plan A = Automatic =stop symbicort and restart Bevespi Take 2 puffs first thing in am and then another 2 puffs about 12 hours later.  ?Work on inhaler technique:   ?  Plan B = Backup ?- only use your albuterol nebulizer if you first try Plan B and it fails to help > ok to use the nebulizer up to every 4 hours but if start needing it regularly call for immediate appointment ? ? ? ? 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 ? ?10/19/2017  Acute extended  ov/Suzanne Dickson re:  COPD III/ 02 dep 2lpm / [place] for review ?Chief Complaint  ?Patient presents with  ? Acute Visit  ?  Breathing has been worse x 2 wks. She has been producing some yellow sputum over the past 2 days. She states also wheezing. She has been using her albuterol inhaler 3 x per day and she has not used neb.   ?Dyspnea:  MMRC3 = baseline can't walk 100 yards even at a slow pace at a flat grade s stopping due to sob on 2lpm but sometimes on RA now worse 2 weeks assoc with watery rhinitis no better with zyrtec  ?Cough:  more productive x 2 weeks assoc with pnds but min yellow  ?Sleeping: 2lpm  Bed flat and 2 big pillows  ?SABA use: not usually needing but now tid  ?02: 2lpm baseline now up to 3lpm  ?rec ?Prednisone 10 mg take  4 each am x 2 days,   2 each am x 2 days,  1 each am x 2 days and stop  ?Plan A = Automatic = symbicort 160 Take 2 puffs first thing in am and then another 2 puffs about 12 hours later.  ?Plan B = Backup ?Only use your albuterol as a rescue medication ?Plan C = Crisis ?- only use your albuterol nebulizer if you first try Plan B and it fails to help > ok to use the nebulizer up to every 4 hours but if start needing it regularly call for immediate appointment ?Goal for 02 is to keep saturation over 90%  ?Please schedule a follow up office visit in 4 weeks, sooner if needed  with all medications /inhalers/ solutions in hand so we can verify exactly what you are taking. This includes all medications from all doctors and over the counters ? ? ? ? ?12/03/2019  f/u ov/Suzanne Dickson re:  GOLD II COPD / 02 dep  On Trelegy and pred 10mg  daily since august 2021 s flare  ?Chief Complaint  ?Patient presents with  ? Follow-up  ?  Breathing has improved some since the last visit. She is using her albuterol inhaler 4-6 x per wk and has not used neb recently.   ?Dyspnea:  Room to room, nothing outdoors  ?Cough: none ?Sleeping: wedge at 45 degrees insomnia but no resp issues  ?SABA use: as above ?02: 4lpm 24 x 6 with walking but not checking sats  ?rec ?Make sure you check your oxygen saturations at highest level of activity   ?Prednisone ceiling is 20 mg per day until better, then 10 mg daily x one week, then 5 mg (one half of a 10 mg tablet)  - if get worse start over ? ?  ?02/16/2021  f/u ov/Suzanne Dickson re: GOLD 2/ 02 dep  maint on trelegy / since covid = prednisone x 20 mg   ?Chief Complaint  ?Patient presents with  ? Follow-up  ?  Breathing is about the same. She has occ wheezing. She is using her albuterol neb about 1 x per day and  albuterol inhaler once at night.   ?Dyspnea:  room to room on 02 at 6-7 lpm  ?Cough: none  ?Sleeping: wedge x 45 degrees, wheezes some then off to sleep fine  ?SABA use: mostly after supper or right before bed  ?02: 6-7lpm  ?Covid status:   vax x 4  ?Rec ?Try prednisone 20 mg alternating with 10 mg daily if tolerate ? ?  ? ?05/25/2021  f/u ov/Suzanne Dickson re: GOLD2  copd/02 dep   maint on 20 every other day  - did not understand rx  instructions  ?Chief Complaint  ?Patient presents with  ? Follow-up  ?  Pt states her breathing has been a little worse since last visit.  ?Dyspnea:  walking at home room to room rolling walker  ?Cough: none  ?Sleeping: 45 degrees  ?SABA use: albuterol q 2 days hfa/ very rarely neg  ?02: 3lpm sitting and up to 6lpm walking  ?  ? ? ?No obvious day to day or daytime variability or assoc excess/ purulent sputum or mucus plugs or hemoptysis or cp or chest tightness, subjective wheeze or overt sinus or hb symptoms.  ? ?Sleeping as above without nocturnal  or early am exacerbation  of respiratory  c/o's or need for noct saba. Also denies any obvious fluctuation of symptoms with weather or environmental changes or other aggravating or alleviating factors except as outlined above  ? ?No unusual exposure hx or h/o childhood pna/ asthma or knowledge of premature birth. ? ?Current Allergies, Complete Past Medical History, Past Surgical History, Family History, and Social History were reviewed in [employer] record. ? ?ROS  The following are not active complaints unless bolded ?Hoarseness, sore throat, dysphagia, dental problems, itching, sneezing,  nasal congestion or discharge of excess mucus or purulent secretions, ear ache,   fever, chills, sweats, unintended wt loss or wt gain, classically pleuritic or exertional cp,  orthopnea pnd or arm/hand swelling  or leg swelling, presyncope, palpitations, abdominal pain, anorexia, nausea, vomiting, diarrhea  or change in bowel habits or  change in bladder habits, change in stools or change in urine, dysuria, hematuria,  rash, arthralgias, visual complaints, headache, numbness, weakness or ataxia or problems with walking or coordination,  change in mood or  memory. ?      ? ?  Current Meds  ?Medication Sig  ? acetaminophen (TYLENOL) 500 MG tablet Take 1 tablet (500 mg total) by mouth every 6 (six) hours as needed.  ? albuterol (PROVENTIL) (2.5 MG/3ML) 0.083% nebulizer solution Use 74mLs every 4 hours and as needed for shortness of breath.  ? albuterol (VENTOLIN HFA) 108 (90 Base) MCG/ACT inhaler INHALE 2 PUFFS BY MOUTH EVERY 4 HOURS AS NEEDED FOR WHEEZING FOR SHORTNESS OF BREATH  ? amLODipine (NORVASC) 5 MG tablet Take 5 mg by mouth daily.  ? aspirin EC 81 MG tablet Take 81 mg by mouth daily. Swallow whole.  ? atorvastatin (LIPITOR) 20 MG tablet Take 20 mg by mouth at bedtime.  ? Azelastine-Fluticasone 137-50 MCG/ACT SUSP 2 PUFFS INTO EACH NOSTRIL AT BEDTIME  ? benzonatate (TESSALON) 200 MG capsule Take 1 capsule (200 mg total) by mouth 3 (three) times daily as needed for cough.  ? cetirizine (ZYRTEC) 10 MG tablet Take 10 mg by mouth at bedtime.  ? cholecalciferol (VITAMIN D3) 25 MCG (1000 UNIT) tablet Take 1,000 Units by mouth daily.  ? clopidogrel (PLAVIX) 75 MG tablet Take 75 mg by mouth every morning.   ? Cyanocobalamin (B-12 PO) Take 1 tablet by mouth daily.  ? famotidine (PEPCID) 20 MG tablet One after supper  ? ferrous sulfate 325 (65 FE) MG EC tablet Take 325 mg by mouth 3 (three) times daily with meals.   ? Fluticasone-Umeclidin-Vilant (TRELEGY ELLIPTA) 100-62.5-25 MCG/INH AEPB Inhale 1 puff into the lungs daily.  ? hydrochlorothiazide (MICROZIDE) 12.5 MG capsule Take 12.5 mg by mouth daily.  ? ibuprofen (ADVIL) 200 MG tablet Take 400 mg by mouth every 6 (six) hours as needed (pain).  ? Lidocaine 4 % PTCH Apply 1 patch topically 2 (two) times daily.  ? losartan (COZAAR) 50 MG tablet Take 50 mg by mouth every morning.  ? methocarbamol (ROBAXIN)  500 MG tablet Take by mouth.  ? metoprolol succinate (TOPROL-XL) 25 MG 24 hr tablet Take 25 mg by mouth daily.  ? naproxen (NAPROSYN) 250 MG tablet Take 1 tablet (250 mg total) by mouth 2 (two) times daily with a

## 2021-05-25 NOTE — Assessment & Plan Note (Addendum)
Quit smoking 08/2014 ?- PFT's 12/09/14   FEV1 0.75 (44 % ) ratio 62  p no % improvement from saba p ? prior to study with DLCO  24 % corrects to 48 % for alv volume   ? - 12/09/2014 referred to rehab > started 12/2014 ? - 04/25/2015   try BEVESPI  ?- 06/20/2015  Changed by insurance> back on symb 160  ?- 09/22/2015 not happy with symbicort > changed back to besvespi 2bid > not happy with bevespi > changed back to symb 160 2bid and consider adding spiriva next vs trelegy trial   ?- 02/17/2018  After extensive coaching inhaler device,  effectiveness =    50% with hfa and 90% with elipta so rec changed to trelegy   ?- 05/08/2018 added pred x 6 days as "plan D"  ?- 01/15/2019 changed pred to 20 mg until better then 10 mg daily  ?- 03/29/2019  After extensive coaching inhaler device,  effectiveness =    50% with hfa  ?- 12/03/2019 changed to ceiling of 20 mg pred/ floor of 5 mg per day ?- 02/16/2021 chagned pred 20 mg ceiling and floor of 10 mg as flared on 5 consistently  ?- rechallenged with ceiling of 20 mg and floor of 5 mg daily/ retrained on Elipta  ? ? Group D (now reclassified as E) in terms of symptom/risk and laba/lama/ICS  therefore appropriate rx at this point >>>  Continue trelegy and lowest effective dose of prednisone plus prn saba ? ?.- The proper method of use, as well as anticipated side effects, of an elipta DPI device were discussed and demonstrated to the patient using teach back method.  ?  ?

## 2021-05-25 NOTE — Assessment & Plan Note (Signed)
Started on 02 2lpm at d/c 09/04/14  ?- 10/28/2014   Walked RA x one lap @ 185 stopped due to  desat to 82%  at nl pace so rec 02 2lpm with walking and sleeping  ?- 12/09/2014 referred for POC > did not qualify ?- 08/17/2016 Patient Saturations on Room Air at Rest = 85%---increased to 93% on 2lpm o2 ?- 04/18/2017  Walking across exam room sats dropped into 80's on 2lpm  ?- 11/16/2017   Walked RA x one lap @ 185 stopped due to 87% and even on 5lpm could not maintain sats at nl pace  ?-  01/11/2018 humidfy 02  Added > not using as of 05/08/2018 > rec she do so > improved on humidity ?- 03/29/2019  Required up to 6lpm with activity to complete about 200 ft walking  ? ?  ?Advised: ?Make sure you check your oxygen saturation  AT  your highest level of activity (not after you stop)   to be sure it stays over 90% and adjust  02 flow upward to maintain this level if needed but remember to turn it back to previous settings when you stop (to conserve your supply).  ? ?F/u a 6 m ? ?    ?  ? ?Each maintenance medication was reviewed in detail including emphasizing most importantly the difference between maintenance and prns and under what circumstances the prns are to be triggered using an action plan format where appropriate. ? ?Total time for H and P, chart review, counseling, reviewing dpi/hfa/neb/02  device(s) and generating customized AVS unique to this office visit / same day charting = 20 min  ?     ? ? ? ?

## 2021-05-25 NOTE — Patient Instructions (Addendum)
Reduce prednisone to 10 mg daily - if worse back to 20  mg and if stay better back it down to 10 mg one half daily  ? ?Tug  harder on the Trelegy inhaler as I showed you today  ? ?Please schedule a follow up visit in 6 months but call sooner if needed  ? ? ? ? ? ?

## 2021-08-04 ENCOUNTER — Other Ambulatory Visit: Payer: Self-pay | Admitting: Internal Medicine

## 2021-08-06 DIAGNOSIS — E785 Hyperlipidemia, unspecified: Secondary | ICD-10-CM | POA: Diagnosis not present

## 2021-08-06 DIAGNOSIS — J439 Emphysema, unspecified: Secondary | ICD-10-CM | POA: Diagnosis not present

## 2021-08-06 DIAGNOSIS — I509 Heart failure, unspecified: Secondary | ICD-10-CM | POA: Diagnosis not present

## 2021-08-06 DIAGNOSIS — I25119 Atherosclerotic heart disease of native coronary artery with unspecified angina pectoris: Secondary | ICD-10-CM | POA: Diagnosis not present

## 2021-08-06 DIAGNOSIS — E261 Secondary hyperaldosteronism: Secondary | ICD-10-CM | POA: Diagnosis not present

## 2021-08-06 DIAGNOSIS — F419 Anxiety disorder, unspecified: Secondary | ICD-10-CM | POA: Diagnosis not present

## 2021-08-06 DIAGNOSIS — I110 Hypertensive heart disease with heart failure: Secondary | ICD-10-CM | POA: Diagnosis not present

## 2021-08-06 DIAGNOSIS — J9611 Chronic respiratory failure with hypoxia: Secondary | ICD-10-CM | POA: Diagnosis not present

## 2021-08-06 DIAGNOSIS — F3341 Major depressive disorder, recurrent, in partial remission: Secondary | ICD-10-CM | POA: Diagnosis not present

## 2021-08-20 DIAGNOSIS — I2510 Atherosclerotic heart disease of native coronary artery without angina pectoris: Secondary | ICD-10-CM | POA: Diagnosis not present

## 2021-08-20 DIAGNOSIS — I10 Essential (primary) hypertension: Secondary | ICD-10-CM | POA: Diagnosis not present

## 2021-08-20 DIAGNOSIS — E785 Hyperlipidemia, unspecified: Secondary | ICD-10-CM | POA: Diagnosis not present

## 2021-08-21 DIAGNOSIS — E785 Hyperlipidemia, unspecified: Secondary | ICD-10-CM | POA: Diagnosis not present

## 2021-08-21 DIAGNOSIS — I2510 Atherosclerotic heart disease of native coronary artery without angina pectoris: Secondary | ICD-10-CM | POA: Diagnosis not present

## 2021-08-21 DIAGNOSIS — I10 Essential (primary) hypertension: Secondary | ICD-10-CM | POA: Diagnosis not present

## 2021-08-21 DIAGNOSIS — R739 Hyperglycemia, unspecified: Secondary | ICD-10-CM | POA: Diagnosis not present

## 2021-09-25 ENCOUNTER — Telehealth: Payer: Self-pay | Admitting: Internal Medicine

## 2021-09-25 MED ORDER — ALBUTEROL SULFATE (2.5 MG/3ML) 0.083% IN NEBU: INHALATION_SOLUTION | RESPIRATORY_TRACT | 11 refills | Status: DC

## 2021-09-25 NOTE — Telephone Encounter (Signed)
Rx for pt's albuterol sol has been sent to pharmacy for pt.nothing further needed.

## 2021-09-29 DIAGNOSIS — J42 Unspecified chronic bronchitis: Secondary | ICD-10-CM | POA: Diagnosis not present

## 2021-11-12 ENCOUNTER — Other Ambulatory Visit: Payer: Self-pay | Admitting: Internal Medicine

## 2021-11-19 DIAGNOSIS — I10 Essential (primary) hypertension: Secondary | ICD-10-CM | POA: Diagnosis not present

## 2021-11-19 DIAGNOSIS — I2510 Atherosclerotic heart disease of native coronary artery without angina pectoris: Secondary | ICD-10-CM | POA: Diagnosis not present

## 2021-11-19 DIAGNOSIS — E785 Hyperlipidemia, unspecified: Secondary | ICD-10-CM | POA: Diagnosis not present

## 2021-11-19 DIAGNOSIS — R7303 Prediabetes: Secondary | ICD-10-CM | POA: Diagnosis not present

## 2021-11-20 DIAGNOSIS — I2510 Atherosclerotic heart disease of native coronary artery without angina pectoris: Secondary | ICD-10-CM | POA: Diagnosis not present

## 2021-11-20 DIAGNOSIS — E785 Hyperlipidemia, unspecified: Secondary | ICD-10-CM | POA: Diagnosis not present

## 2021-11-20 DIAGNOSIS — I10 Essential (primary) hypertension: Secondary | ICD-10-CM | POA: Diagnosis not present

## 2021-11-20 DIAGNOSIS — E119 Type 2 diabetes mellitus without complications: Secondary | ICD-10-CM | POA: Diagnosis not present

## 2021-11-26 ENCOUNTER — Ambulatory Visit: Payer: Medicare PPO | Admitting: Internal Medicine

## 2021-11-26 ENCOUNTER — Encounter: Payer: Self-pay | Admitting: Internal Medicine

## 2021-11-26 VITALS — BP 126/70 | HR 88 | Ht 62.0 in

## 2021-11-26 DIAGNOSIS — J449 Chronic obstructive pulmonary disease, unspecified: Secondary | ICD-10-CM

## 2021-11-26 DIAGNOSIS — J9611 Chronic respiratory failure with hypoxia: Secondary | ICD-10-CM | POA: Diagnosis not present

## 2021-11-26 NOTE — Patient Instructions (Signed)
Prednisone 10 mg alternating with 5 mg   Plan A = Automatic = Always=    Trelegy 100 each am   Plan B = Backup (to supplement plan A, not to replace it) Only use your albuterol inhaler as a rescue medication to be used if you can't catch your breath by resting or doing a relaxed purse lip breathing pattern.  - The less you use it, the better it will work when you need it. - Ok to use the inhaler up to 2 puffs  every 4 hours if you must but call for appointment if use goes up over your usual need - Don't leave home without it !!  (think of it like the spare tire for your car)   Plan C = Crisis (instead of Plan B but only if Plan B stops working) - only use your albuterol nebulizer if you first try Plan B and it fails to help > ok to use the nebulizer up to every 4 hours but if start needing it regularly call for immediate appointment    Please schedule a follow up visit in 6  months but call sooner if needed

## 2021-11-26 NOTE — Assessment & Plan Note (Signed)
Started on 02 2lpm at d/c 09/04/14  - 10/28/2014   Walked RA x one lap @ 185 stopped due to  desat to 82%  at nl pace so rec 02 2lpm with walking and sleeping  - 12/09/2014 referred for POC > did not qualify - 08/17/2016 Patient Saturations on Room Air at Rest = 85%---increased to 93% on 2lpm o2 - 04/18/2017  Walking across exam room sats dropped into 80's on 2lpm  - 11/16/2017   Walked RA x one lap @ 185 stopped due to 87% and even on 5lpm could not maintain sats at nl pace  -  01/11/2018 humidfy 02  Added > not using as of 05/08/2018 > rec she do so > improved on humidity - 03/29/2019  Required up to 6lpm with activity to complete about 200 ft walking   Adequate control on present rx, reviewed in detail with pt > no change in rx needed   = 3lpm at rest and up to 6lpm with ex   Make sure you check your oxygen saturation  AT  your highest level of activity (not after you stop)   to be sure it stays over 90% and adjust  02 flow upward to maintain this level if needed but remember to turn it back to previous settings when you stop (to conserve your supply).          Each maintenance medication was reviewed in detail including emphasizing most importantly the difference between maintenance and prns and under what circumstances the prns are to be triggered using an action plan format where appropriate.  Total time for H and P, chart review, counseling, reviewing hfa/dpi/neb/02 device(s) and generating customized AVS unique to this office visit / same day charting = 20 min

## 2021-11-26 NOTE — Progress Notes (Addendum)
Subjective:     Patient ID: Suzanne Dickson, female   DOB: [birth year]     MRN: [id number]    Brief patient profile:  31 yobf quit smoking on admit 09/02/14  and proved to have GOLD III copd 11/2014     History of Present Illness  09/22/2015  f/u ov/[name] re: copd III/ symb 1602bid on 2lpm at rest/ 4lpm ex  Chief Complaint  Patient presents with   Follow-up    [place] had hemoptysis and increased SOB. She took round pred and augmentin and symptoms are some better. She has noticed minimal wheezing at night. She has used neb 3 x over the past 2 wks.   liked bevespi better but wasn't covered then and not doing as well on the symbicort though hfa not ideal  - see a/p rec Plan A = Automatic =stop symbicort and restart Bevespi Take 2 puffs first thing in am and then another 2 puffs about 12 hours later.  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  Dyspnea:  walking at home room to room rolling walker  Cough: none  Sleeping: 45 degrees  SABA use: albuterol q 2 days hfa/ very rarely neg  02: 3lpm sitting and up to 6lpm walking  Rec Reduce prednisone to 10 mg daily - if worse back to 20  mg and if stay better back it down to 10 mg one half daily  Tug  harder on the  Trelegy inhaler as I showed you today  Please schedule a follow up visit in 6 months but call sooner if needed    11/26/2021  f/u ov/[name] re: GOLD 2 copd/02 dep   maint on 10 mg /daily worse on 5mg   Chief Complaint  Patient presents with   Follow-up  Dyspnea:  across the room / sometimes sits outside garage Cough: none  Sleeping: 45 degrees wedge pillow  SABA use: uses hfa but neb one or two a day  02: 4 sleeping and up to  6lpm walking with sats mostly 90s      No obvious day to day or daytime variability or assoc excess/ purulent sputum or mucus plugs or hemoptysis or cp or chest tightness, subjective wheeze or overt sinus or hb symptoms.   sleeping without nocturnal  or early am exacerbation  of respiratory  c/o's or need for noct saba. Also denies any obvious fluctuation of symptoms with weather or environmental changes or other aggravating or alleviating factors except as outlined above   No unusual exposure hx or h/o childhood pna/ asthma or knowledge of premature birth.  Current Allergies, Complete  Past Medical History, Past Surgical History, Family History, and Social History were reviewed in [employer] record.  ROS  The following are not active complaints unless bolded Hoarseness, sore throat, dysphagia, dental problems, itching, sneezing,  nasal congestion or discharge of excess mucus or purulent secretions, ear ache,   fever, chills, sweats, unintended wt loss or wt gain, classically pleuritic or exertional cp,  orthopnea pnd or arm/hand swelling  or leg swelling, presyncope, palpitations, abdominal pain, anorexia, nausea, vomiting, diarrhea  or change in bowel habits or change in bladder habits, change in stools or change in urine, dysuria, hematuria,  rash, arthralgias, visual complaints, headache, numbness, weakness or ataxia or problems with walking/walker/wc dep or coordination,  change in mood or  memory.        Current Meds  Medication Sig    acetaminophen (TYLENOL) 500 MG tablet Take 1 tablet (500 mg total) by mouth every 6 (six) hours as needed.   albuterol (PROVENTIL) (2.5 MG/3ML) 0.083% nebulizer solution Use 34mLs every 4 hours and as needed for shortness of breath.   albuterol (VENTOLIN HFA) 108 (90 Base) MCG/ACT inhaler INHALE 2 PUFFS BY MOUTH EVERY 4 HOURS AS NEEDED FOR WHEEZING FOR SHORTNESS OF BREATH   amLODipine (NORVASC) 5 MG tablet Take 5 mg by mouth daily.   aspirin EC 81 MG tablet Take 81 mg by mouth daily. Swallow whole.   atorvastatin (LIPITOR) 20 MG tablet Take 20 mg by mouth at bedtime.   cetirizine (ZYRTEC) 10 MG tablet Take 10 mg by mouth at bedtime.   cholecalciferol (VITAMIN D3) 25 MCG (1000 UNIT) tablet Take 1,000 Units by mouth daily.   clopidogrel (PLAVIX) 75 MG tablet Take 75 mg by mouth every morning.    Cyanocobalamin (B-12 PO) Take 1 tablet by mouth daily.   famotidine (PEPCID) 20 MG tablet One after supper   ferrous sulfate 325 (65 FE) MG EC tablet Take 325 mg by mouth 3 (three) times daily with meals.    Fluticasone-Umeclidin-Vilant (TRELEGY ELLIPTA) 100-62.5-25 MCG/ACT AEPB INHALE 1 PUFF ONCE DAILY   losartan (COZAAR) 50 MG tablet Take 50 mg by mouth every morning.   methocarbamol (ROBAXIN) 500 MG tablet Take by mouth.   metoprolol succinate (TOPROL-XL) 25 MG 24 hr tablet Take 25 mg by mouth daily.   naproxen (NAPROSYN) 250 MG tablet Take 1 tablet (250 mg total) by mouth 2 (two) times daily with a meal.   nitroGLYCERIN (NITRODUR - DOSED IN MG/24 HR) 0.4 mg/hr patch Place 0.4 mg onto the skin every morning.   nitroGLYCERIN (NITROSTAT) 0.4 MG SL tablet Place 0.4 mg under the tongue every 5 (five) minutes as needed for chest pain.   OVER THE COUNTER MEDICATION Take 1 tablet by mouth at bedtime. OTC sleep aid - unknown ingredients   OVER THE COUNTER MEDICATION Take 3,000 mcg by mouth daily. Vitamin B12 3048mcg tablet daily   OXYGEN Inhale 4-6 L into the lungs continuous.   pantoprazole (PROTONIX) 40 MG  tablet Take 30-60 min before first meal of the day (Patient taking differently: Take 40 mg by mouth daily before breakfast.)   Polyvinyl Alcohol-Povidone (REFRESH OP) Place 1 drop into both eyes daily as needed (dry eyes).   predniSONE (DELTASONE) 10 MG tablet TAKE 2 TABLETS BY MOUTH ONCE DAILY UNTIL FEELING BETTER THEN TAKE 1 TABLET DAILY   sertraline (ZOLOFT) 100 MG tablet Take 100 mg by mouth daily.  Objective:  Physical Exam  Wts    05/25/2021     156 02/16/2021   158 06/02/2020     170  12/03/2019   162 03/29/2019     164  08/08/2018   154 10/28/2014        160 > 12/09/2014 161 > 03/14/2015 158 >    04/25/2015  161 >  06/20/2015  158 > 09/22/2015   159 > 11/19/2015 159 > 12/23/2015   159  > 02/03/2016    163  > 05/18/2016   161 > 11/17/2016   154 > 01/07/2017  157 > 04/18/2017   148 > 07/19/2017  145 > 10/19/2017    150  > 11/16/2017   150 >  01/11/2018   150> 02/17/2018  150     10/10/14 160 lb (72.576 kg)  09/16/14 156 lb (70.761 kg)  09/04/14 160 lb 13.6 oz (72.96 kg)    Vital signs reviewed  11/26/2021  - Note at rest 02 sats  91% on 3lpm    General appearance:    w/c bound frail elderly bf nad   HEENT : Oropharynx  clear   Nasal turbinates nl    NECK :  without  apparent JVD/ palpable Nodes/TM    LUNGS: no acc muscle use,  Mild barrel  contour chest wall with bilateral  Distant bs s audible wheeze and  without cough on insp or exp maneuvers  and mild  Hyperresonant  to  percussion bilaterally     CV:  RRR  no s3 or murmur or increase in P2, and no edema   ABD:  soft and nontender with pos end  insp Hoover's  in the supine position.  No bruits or organomegaly appreciated   MS:  ext warm without deformities Or obvious joint restrictions  calf tenderness, cyanosis or clubbing     SKIN: warm and dry without lesions    NEURO:  alert, approp, nl sensorium with  no motor or cerebellar deficits apparent.                 Assessment:

## 2021-11-26 NOTE — Assessment & Plan Note (Signed)
Quit smoking 08/2014 - PFT's 12/09/14   FEV1 0.75 (44 % ) ratio 62  p no % improvement from saba p ? prior to study with DLCO  24 % corrects to 48 % for alv volume    - 12/09/2014 referred to rehab > started 12/2014  - 04/25/2015   try BEVESPI  - 06/20/2015  Changed by insurance> back on symb 160  - 09/22/2015 not happy with symbicort > changed back to besvespi 2bid > not happy with bevespi > changed back to symb 160 2bid and consider adding spiriva next vs trelegy trial   - 02/17/2018  After extensive coaching inhaler device,  effectiveness =    50% with hfa and 90% with elipta so rec changed to trelegy   - 05/08/2018 added pred x 6 days as "plan D"  - 01/15/2019 changed pred to 20 mg until better then 10 mg daily  - 03/29/2019  After extensive coaching inhaler device,  effectiveness =    50% with hfa  - 12/03/2019 changed to ceiling of 20 mg pred/ floor of 5 mg per day - 02/16/2021 chagned pred 20 mg ceiling and floor of 10 mg as flared on 5 consistently  - rechallenged with ceiling of 20 mg and floor of 5 mg daily/ retrained on Elipta    Group D (now reclassified as E) in terms of symptom/risk and laba/lama/ICS  therefore appropriate rx at this point >>>  trelegy plus approp saba plus pred min dose = does not do well on 5 mg  p 3 days so rec 10-29-08 as new floor   Re SABA :  I spent extra time with pt today reviewing appropriate use of albuterol for prn use on exertion with the following points: 1) saba is for relief of sob that does not improve by walking a slower pace or resting but rather if the pt does not improve after trying this first. 2) If the pt is convinced, as many are, that saba helps recover from activity faster then it's easy to tell if this is the case by re-challenging : ie stop, take the inhaler, then p 5 minutes try the exact same activity (intensity of workload) that just caused the symptoms and see if they are substantially diminished or not after saba 3) if there is an activity that  reproducibly causes the symptoms, try the saba 15 min before the activity on alternate days   If in fact the saba really does help, then fine to continue to use it prn but advised may need to look closer at the maintenance regimen being used to achieve better control of airways disease with exertion.

## 2021-11-27 DIAGNOSIS — J42 Unspecified chronic bronchitis: Secondary | ICD-10-CM | POA: Diagnosis not present

## 2022-01-06 ENCOUNTER — Telehealth: Payer: Self-pay | Admitting: Internal Medicine

## 2022-01-06 NOTE — Telephone Encounter (Signed)
z-pak 

## 2022-01-06 NOTE — Telephone Encounter (Signed)
Pt taking 2 pred each day now but feels as though she needs an antibx. Please call to advise. Wants a cb from nurse. Tnx.

## 2022-01-06 NOTE — Telephone Encounter (Signed)
Called patient and she states the her head is very congested. Her cough is deep and wet sounding. Cough is sometimes productive at times. Chills at times. Pt states that she is feeling a little bit better but she is asking for an antibiotic to be called in. She states she is on daily prednisone but she states she feels like an antibiotic would help.  Please advise sir

## 2022-01-07 MED ORDER — AZITHROMYCIN 250 MG PO TABS: 250.0000 mg | ORAL_TABLET | Freq: Every day | ORAL | 0 refills | Status: DC

## 2022-01-07 NOTE — Telephone Encounter (Signed)
Called and spoke to patient and went over the medication that Dr Melvyn Novas wanted to send in. She verbalized understanding. Went over pharmacy with her as well. Medication sent in. Nothing further needed

## 2022-01-26 ENCOUNTER — Other Ambulatory Visit: Payer: Self-pay | Admitting: Internal Medicine

## 2022-01-31 ENCOUNTER — Other Ambulatory Visit: Payer: Self-pay | Admitting: Internal Medicine

## 2022-02-08 DIAGNOSIS — J42 Unspecified chronic bronchitis: Secondary | ICD-10-CM | POA: Diagnosis not present

## 2022-02-19 DIAGNOSIS — Z130 Encounter for screening for diseases of the blood and blood-forming organs and certain disorders involving the immune mechanism: Secondary | ICD-10-CM | POA: Diagnosis not present

## 2022-02-19 DIAGNOSIS — Z113 Encounter for screening for infections with a predominantly sexual mode of transmission: Secondary | ICD-10-CM | POA: Diagnosis not present

## 2022-02-19 DIAGNOSIS — J209 Acute bronchitis, unspecified: Secondary | ICD-10-CM | POA: Diagnosis not present

## 2022-02-19 DIAGNOSIS — F411 Generalized anxiety disorder: Secondary | ICD-10-CM | POA: Diagnosis not present

## 2022-02-19 DIAGNOSIS — Z741 Need for assistance with personal care: Secondary | ICD-10-CM | POA: Diagnosis not present

## 2022-02-19 DIAGNOSIS — Z0000 Encounter for general adult medical examination without abnormal findings: Secondary | ICD-10-CM | POA: Diagnosis not present

## 2022-02-19 DIAGNOSIS — J441 Chronic obstructive pulmonary disease with (acute) exacerbation: Secondary | ICD-10-CM | POA: Diagnosis not present

## 2022-02-19 DIAGNOSIS — D72829 Elevated white blood cell count, unspecified: Secondary | ICD-10-CM | POA: Diagnosis not present

## 2022-02-19 DIAGNOSIS — Z1211 Encounter for screening for malignant neoplasm of colon: Secondary | ICD-10-CM | POA: Diagnosis not present

## 2022-02-19 DIAGNOSIS — Z9981 Dependence on supplemental oxygen: Secondary | ICD-10-CM | POA: Diagnosis not present

## 2022-02-19 DIAGNOSIS — Z1329 Encounter for screening for other suspected endocrine disorder: Secondary | ICD-10-CM | POA: Diagnosis not present

## 2022-03-02 ENCOUNTER — Other Ambulatory Visit: Payer: Self-pay | Admitting: Internal Medicine

## 2022-03-08 DIAGNOSIS — R3981 Functional urinary incontinence: Secondary | ICD-10-CM | POA: Diagnosis not present

## 2022-03-08 DIAGNOSIS — E119 Type 2 diabetes mellitus without complications: Secondary | ICD-10-CM | POA: Diagnosis not present

## 2022-03-08 DIAGNOSIS — I10 Essential (primary) hypertension: Secondary | ICD-10-CM | POA: Diagnosis not present

## 2022-03-08 DIAGNOSIS — H6121 Impacted cerumen, right ear: Secondary | ICD-10-CM | POA: Diagnosis not present

## 2022-03-09 DIAGNOSIS — I2510 Atherosclerotic heart disease of native coronary artery without angina pectoris: Secondary | ICD-10-CM | POA: Diagnosis not present

## 2022-03-09 DIAGNOSIS — E785 Hyperlipidemia, unspecified: Secondary | ICD-10-CM | POA: Diagnosis not present

## 2022-03-09 DIAGNOSIS — I10 Essential (primary) hypertension: Secondary | ICD-10-CM | POA: Diagnosis not present

## 2022-03-09 DIAGNOSIS — E119 Type 2 diabetes mellitus without complications: Secondary | ICD-10-CM | POA: Diagnosis not present

## 2022-03-24 DIAGNOSIS — H40033 Anatomical narrow angle, bilateral: Secondary | ICD-10-CM | POA: Diagnosis not present

## 2022-03-24 DIAGNOSIS — H2513 Age-related nuclear cataract, bilateral: Secondary | ICD-10-CM | POA: Diagnosis not present

## 2022-04-13 DIAGNOSIS — M5440 Lumbago with sciatica, unspecified side: Secondary | ICD-10-CM | POA: Diagnosis not present

## 2022-04-13 DIAGNOSIS — F419 Anxiety disorder, unspecified: Secondary | ICD-10-CM | POA: Diagnosis not present

## 2022-04-13 DIAGNOSIS — Z1211 Encounter for screening for malignant neoplasm of colon: Secondary | ICD-10-CM | POA: Diagnosis not present

## 2022-04-13 DIAGNOSIS — R350 Frequency of micturition: Secondary | ICD-10-CM | POA: Diagnosis not present

## 2022-04-13 DIAGNOSIS — S3210XS Unspecified fracture of sacrum, sequela: Secondary | ICD-10-CM | POA: Diagnosis not present

## 2022-04-13 DIAGNOSIS — E119 Type 2 diabetes mellitus without complications: Secondary | ICD-10-CM | POA: Diagnosis not present

## 2022-04-14 DIAGNOSIS — E119 Type 2 diabetes mellitus without complications: Secondary | ICD-10-CM | POA: Diagnosis not present

## 2022-04-20 ENCOUNTER — Inpatient Hospital Stay: Payer: Medicare PPO | Attending: Internal Medicine

## 2022-04-20 ENCOUNTER — Ambulatory Visit (HOSPITAL_COMMUNITY)
Admission: RE | Admit: 2022-04-20 | Discharge: 2022-04-20 | Disposition: A | Discharge status: Home / Self Care | Payer: Medicare PPO | Source: Ambulatory Visit | Attending: Internal Medicine | Admitting: Internal Medicine

## 2022-04-20 ENCOUNTER — Other Ambulatory Visit: Payer: Self-pay

## 2022-04-20 DIAGNOSIS — Z923 Personal history of irradiation: Secondary | ICD-10-CM | POA: Insufficient documentation

## 2022-04-20 DIAGNOSIS — Z79899 Other long term (current) drug therapy: Secondary | ICD-10-CM | POA: Diagnosis not present

## 2022-04-20 DIAGNOSIS — K449 Diaphragmatic hernia without obstruction or gangrene: Secondary | ICD-10-CM | POA: Diagnosis not present

## 2022-04-20 DIAGNOSIS — J432 Centrilobular emphysema: Secondary | ICD-10-CM | POA: Diagnosis not present

## 2022-04-20 DIAGNOSIS — R911 Solitary pulmonary nodule: Secondary | ICD-10-CM | POA: Diagnosis not present

## 2022-04-20 DIAGNOSIS — C3490 Malignant neoplasm of unspecified part of unspecified bronchus or lung: Secondary | ICD-10-CM | POA: Diagnosis not present

## 2022-04-20 DIAGNOSIS — J439 Emphysema, unspecified: Secondary | ICD-10-CM | POA: Diagnosis not present

## 2022-04-20 DIAGNOSIS — I700 Atherosclerosis of aorta: Secondary | ICD-10-CM | POA: Diagnosis not present

## 2022-04-20 DIAGNOSIS — C3412 Malignant neoplasm of upper lobe, left bronchus or lung: Secondary | ICD-10-CM | POA: Diagnosis not present

## 2022-04-20 LAB — CBC WITH DIFFERENTIAL (CANCER CENTER ONLY)
Abs Immature Granulocytes: 0.13 10*3/uL — ABNORMAL HIGH (ref 0.00–0.07)
Basophils Absolute: 0 10*3/uL (ref 0.0–0.1)
Basophils Relative: 0 %
Eosinophils Absolute: 0.1 10*3/uL (ref 0.0–0.5)
Eosinophils Relative: 1 %
HCT: 35.3 % — ABNORMAL LOW (ref 36.0–46.0)
Hemoglobin: 10.7 g/dL — ABNORMAL LOW (ref 12.0–15.0)
Immature Granulocytes: 1 %
Lymphocytes Relative: 10 %
Lymphs Abs: 1.2 10*3/uL (ref 0.7–4.0)
MCH: 31.8 pg (ref 26.0–34.0)
MCHC: 30.3 g/dL (ref 30.0–36.0)
MCV: 105.1 fL — ABNORMAL HIGH (ref 80.0–100.0)
Monocytes Absolute: 0.8 10*3/uL (ref 0.1–1.0)
Monocytes Relative: 6 %
Neutro Abs: 10.5 10*3/uL — ABNORMAL HIGH (ref 1.7–7.7)
Neutrophils Relative %: 82 %
Platelet Count: 151 10*3/uL (ref 150–400)
RBC: 3.36 MIL/uL — ABNORMAL LOW (ref 3.87–5.11)
RDW: 12.4 % (ref 11.5–15.5)
WBC Count: 12.6 10*3/uL — ABNORMAL HIGH (ref 4.0–10.5)
nRBC: 0 % (ref 0.0–0.2)

## 2022-04-20 LAB — CMP (CANCER CENTER ONLY)
ALT: 20 U/L (ref 0–44)
AST: 16 U/L (ref 15–41)
Albumin: 4.4 g/dL (ref 3.5–5.0)
Alkaline Phosphatase: 53 U/L (ref 38–126)
Anion gap: 6 (ref 5–15)
BUN: 40 mg/dL — ABNORMAL HIGH (ref 8–23)
CO2: 31 mmol/L (ref 22–32)
Calcium: 10.3 mg/dL (ref 8.9–10.3)
Chloride: 105 mmol/L (ref 98–111)
Creatinine: 1.4 mg/dL — ABNORMAL HIGH (ref 0.44–1.00)
GFR, Estimated: 39 mL/min — ABNORMAL LOW (ref >60)
Glucose, Bld: 173 mg/dL — ABNORMAL HIGH (ref 70–99)
Potassium: 4.5 mmol/L (ref 3.5–5.1)
Sodium: 142 mmol/L (ref 135–145)
Total Bilirubin: 0.4 mg/dL (ref 0.3–1.2)
Total Protein: 6.8 g/dL (ref 6.5–8.1)

## 2022-04-22 ENCOUNTER — Other Ambulatory Visit: Payer: Medicare PPO

## 2022-04-26 ENCOUNTER — Other Ambulatory Visit: Payer: Self-pay | Admitting: Internal Medicine

## 2022-04-29 ENCOUNTER — Other Ambulatory Visit: Payer: Self-pay

## 2022-04-29 ENCOUNTER — Inpatient Hospital Stay: Payer: Medicare PPO | Attending: Internal Medicine | Admitting: Internal Medicine

## 2022-04-29 VITALS — BP 160/67 | HR 98 | Temp 98.1°F | Resp 17 | Wt 161.6 lb

## 2022-04-29 DIAGNOSIS — C3412 Malignant neoplasm of upper lobe, left bronchus or lung: Secondary | ICD-10-CM | POA: Insufficient documentation

## 2022-04-29 DIAGNOSIS — Z9049 Acquired absence of other specified parts of digestive tract: Secondary | ICD-10-CM | POA: Diagnosis not present

## 2022-04-29 DIAGNOSIS — Z79899 Other long term (current) drug therapy: Secondary | ICD-10-CM | POA: Diagnosis not present

## 2022-04-29 DIAGNOSIS — M8580 Other specified disorders of bone density and structure, unspecified site: Secondary | ICD-10-CM | POA: Diagnosis not present

## 2022-04-29 DIAGNOSIS — Z7902 Long term (current) use of antithrombotics/antiplatelets: Secondary | ICD-10-CM | POA: Insufficient documentation

## 2022-04-29 DIAGNOSIS — J449 Chronic obstructive pulmonary disease, unspecified: Secondary | ICD-10-CM | POA: Insufficient documentation

## 2022-04-29 DIAGNOSIS — Z7952 Long term (current) use of systemic steroids: Secondary | ICD-10-CM | POA: Diagnosis not present

## 2022-04-29 DIAGNOSIS — C3490 Malignant neoplasm of unspecified part of unspecified bronchus or lung: Secondary | ICD-10-CM | POA: Diagnosis not present

## 2022-04-29 DIAGNOSIS — R0789 Other chest pain: Secondary | ICD-10-CM | POA: Diagnosis not present

## 2022-04-29 DIAGNOSIS — K449 Diaphragmatic hernia without obstruction or gangrene: Secondary | ICD-10-CM | POA: Insufficient documentation

## 2022-04-29 DIAGNOSIS — Z9981 Dependence on supplemental oxygen: Secondary | ICD-10-CM | POA: Diagnosis not present

## 2022-04-29 DIAGNOSIS — Z923 Personal history of irradiation: Secondary | ICD-10-CM | POA: Insufficient documentation

## 2022-04-29 DIAGNOSIS — N281 Cyst of kidney, acquired: Secondary | ICD-10-CM | POA: Insufficient documentation

## 2022-04-29 DIAGNOSIS — R0602 Shortness of breath: Secondary | ICD-10-CM | POA: Insufficient documentation

## 2022-04-29 NOTE — Progress Notes (Signed)
[facility] [phone]   [phone]  OFFICE PROGRESS NOTE  Joycelyn Man, FNP [address] [place] [address]  DIAGNOSIS: Stage IA (T1a, N0, M0) non-small cell lung cancer, adenocarcinoma presented with left upper lobe lung nodule diagnosed in March 2018.  PRIOR THERAPY: S/P RT to the left lung nodule under the care of Dr. Sondra Come completed on 05/04/2016.  CURRENT THERAPY: Observation.  INTERVAL HISTORY: Suzanne Dickson 77 y.o. female returns to the clinic today for follow-up visit accompanied by her husband.  The patient is feeling fine today with no concerning complaints except for pain on the left side of the chest as well as shortness of breath.  She is currently on home oxygen.  She denied having any recent weight loss or night sweats.  She has no nausea, vomiting, diarrhea or constipation.  She has no headache or visual changes.  She had repeat CT scan of the chest performed recently and she is here for evaluation and discussion of her scan results.   MEDICAL HISTORY: Past Medical History:  Diagnosis Date   Adenocarcinoma of left lung, stage 1 ([facility]) 04/15/2016   "I'm in remission" (04/05/2017)   Adenocarcinoma, lung ([facility])    Anxiety    Arthritis    "legs, elbows" (04/05/2017)   Bronchitis, chronic ([facility])    "haven't had it in awhile" (04/05/2017)   CHF (congestive heart failure) ([facility])    COPD (chronic obstructive pulmonary disease) ([facility])    Coronary artery disease    Daily headache    "recently" (04/05/2017)   Depression    Dyspnea    with exertion   GERD (gastroesophageal reflux disease)    History of blood transfusion 1950s   History of radiation therapy 05/04/16 - 05/12/16   Left lung treated to 54 Gy with 3 fx of 18 Gy   Hypertension    On home oxygen therapy    "2L; 24/7" (04/05/2017)   OSA (obstructive sleep apnea)    OSA on CPAP    Pneumonia 10/2015   "at least 3 times" (04/05/2017)   Pulmonary hypertension ([facility])      ALLERGIES:  has No Known Allergies.  MEDICATIONS:  Current Outpatient Medications  Medication Sig Dispense Refill   acetaminophen (TYLENOL) 500 MG tablet Take 1 tablet (500 mg total) by mouth every 6 (six) hours as needed. 30 tablet 0   albuterol (PROVENTIL) (2.5 MG/3ML) 0.083% nebulizer solution Use 53mLs every 4 hours and as needed for shortness of breath. 180 mL 11   albuterol (VENTOLIN HFA) 108 (90 Base) MCG/ACT inhaler INHALE 2 PUFFS BY MOUTH EVERY 4 HOURS AS NEEDED FOR WHEEZING FOR SHORTNESS OF BREATH 18 g 2   amLODipine (NORVASC) 5 MG tablet Take 5 mg by mouth daily.     aspirin EC 81 MG tablet Take 81 mg by mouth daily. Swallow whole.     atorvastatin (LIPITOR) 20 MG tablet Take 20 mg by mouth at bedtime.     Azelastine-Fluticasone 137-50 MCG/ACT SUSP 2 PUFFS INTO EACH NOSTRIL AT BEDTIME (Patient not taking: Reported on 11/26/2021) 23 g 3   azithromycin (ZITHROMAX) 250 MG tablet Take 1 tablet (250 mg total) by mouth daily. 6 tablet 0   benzonatate (TESSALON) 200 MG capsule Take 1 capsule (200 mg total) by mouth 3 (three) times daily as needed for cough. (Patient not taking: Reported on 11/26/2021) 20 capsule 0   cetirizine (ZYRTEC) 10 MG tablet Take 10 mg by mouth at  bedtime.     cholecalciferol (VITAMIN D3) 25 MCG (1000 UNIT) tablet Take 1,000 Units by mouth daily.     clopidogrel (PLAVIX) 75 MG tablet Take 75 mg by mouth every morning.      Cyanocobalamin (B-12 PO) Take 1 tablet by mouth daily.     famotidine (PEPCID) 20 MG tablet One after supper 30 tablet 11   ferrous sulfate 325 (65 FE) MG EC tablet Take 325 mg by mouth 3 (three) times daily with meals.      Fluticasone-Umeclidin-Vilant (TRELEGY ELLIPTA) 100-62.5-25 MCG/ACT AEPB INHALE 1 PUFF ONCE DAILY 60 each 3   hydrochlorothiazide (MICROZIDE) 12.5 MG capsule Take 12.5 mg by mouth daily. (Patient not taking: Reported on 11/26/2021)     ibuprofen (ADVIL) 200 MG tablet Take 400 mg by mouth every 6 (six) hours as needed (pain).  (Patient not taking: Reported on 11/26/2021)     Lidocaine 4 % PTCH Apply 1 patch topically 2 (two) times daily. (Patient not taking: Reported on 11/26/2021) 12 patch 0   losartan (COZAAR) 50 MG tablet Take 50 mg by mouth every morning.     methocarbamol (ROBAXIN) 500 MG tablet Take by mouth.     metoprolol succinate (TOPROL-XL) 25 MG 24 hr tablet Take 25 mg by mouth daily.     naproxen (NAPROSYN) 250 MG tablet Take 1 tablet (250 mg total) by mouth 2 (two) times daily with a meal. 10 tablet 0   nitroGLYCERIN (NITRODUR - DOSED IN MG/24 HR) 0.4 mg/hr patch Place 0.4 mg onto the skin every morning.  3   nitroGLYCERIN (NITROSTAT) 0.4 MG SL tablet Place 0.4 mg under the tongue every 5 (five) minutes as needed for chest pain.     OVER THE COUNTER MEDICATION Take 1 tablet by mouth at bedtime. OTC sleep aid - unknown ingredients     OVER THE COUNTER MEDICATION Take 3,000 mcg by mouth daily. Vitamin B12 3037mcg tablet daily     OXYGEN Inhale 4-6 L into the lungs continuous.     pantoprazole (PROTONIX) 40 MG tablet Take 30-60 min before first meal of the day (Patient taking differently: Take 40 mg by mouth daily before breakfast.) 90 tablet 3   Polyvinyl Alcohol-Povidone (REFRESH OP) Place 1 drop into both eyes daily as needed (dry eyes).     predniSONE (DELTASONE) 10 MG tablet TAKE 2 TABLETS BY MOUTH ONCE DAILY UNTIL  FEELING  BETTER  THEN  TAKE  1  TABLET  DAILY 100 tablet 0   sertraline (ZOLOFT) 100 MG tablet Take 100 mg by mouth daily.     tiZANidine (ZANAFLEX) 4 MG tablet Take 1 tablet (4 mg total) by mouth every 8 (eight) hours as needed for muscle spasms. (Patient not taking: Reported on 11/26/2021) 15 tablet 0   No current facility-administered medications for this visit.    SURGICAL HISTORY:  Past Surgical History:  Procedure Laterality Date   COLONOSCOPY     CORONARY ANGIOPLASTY WITH STENT PLACEMENT  04/05/2017   CORONARY STENT INTERVENTION N/A 04/05/2017   Procedure: CORONARY STENT  INTERVENTION;  Surgeon: [name], MD;  Location: [facility];  Service: Cardiovascular;  Laterality: N/A;   DILATION AND CURETTAGE OF UTERUS     FUDUCIAL PLACEMENT Left 03/31/2016   Procedure: PLACEMENT OF FUDUCIAL LEFT UPPER LOBE;  Surgeon: Collene Gobble, MD;  Location: [facility];  Service: Thoracic;  Laterality: Left;   LAPAROSCOPIC CHOLECYSTECTOMY     LEFT HEART CATH AND CORONARY ANGIOGRAPHY N/A 02/17/2017  Procedure: LEFT HEART CATH AND CORONARY ANGIOGRAPHY;  Surgeon: [name], MD;  Location: [facility];  Service: Cardiovascular;  Laterality: N/A;   SINUSOTOMY     TONSILLECTOMY     TUBAL LIGATION     VIDEO BRONCHOSCOPY WITH ENDOBRONCHIAL NAVIGATION N/A 03/31/2016   Procedure: VIDEO BRONCHOSCOPY WITH ENDOBRONCHIAL NAVIGATION;  Surgeon: Collene Gobble, MD;  Location: [facility];  Service: Thoracic;  Laterality: N/A;    REVIEW OF SYSTEMS:  A comprehensive review of systems was negative except for: Constitutional: positive for fatigue Respiratory: positive for dyspnea on exertion and pleurisy/chest pain   PHYSICAL EXAMINATION: General appearance: alert, cooperative, fatigued, and no distress Head: Normocephalic, without obvious abnormality, atraumatic Neck: no adenopathy, no JVD, supple, symmetrical, trachea midline, and thyroid not enlarged, symmetric, no tenderness/mass/nodules Lymph nodes: Cervical, supraclavicular, and axillary nodes normal. Resp: clear to auscultation bilaterally Back: symmetric, no curvature. ROM normal. No CVA tenderness. Cardio: regular rate and rhythm, S1, S2 normal, no murmur, click, rub or gallop GI: soft, non-tender; bowel sounds normal; no masses,  no organomegaly Extremities: extremities normal, atraumatic, no cyanosis or edema  ECOG PERFORMANCE STATUS: 1 - Symptomatic but completely ambulatory  Blood pressure (!) 160/67, pulse 98, temperature 98.1 F (36.7 C), resp. rate 17, weight 161 lb 9.6 oz (73.3 kg), SpO2 98 %.  LABORATORY  DATA: Lab Results  Component Value Date   WBC 12.6 (H) 04/20/2022   HGB 10.7 (L) 04/20/2022   HCT 35.3 (L) 04/20/2022   MCV 105.1 (H) 04/20/2022   PLT 151 04/20/2022      Chemistry      Component Value Date/Time   NA 142 04/20/2022 1102   NA 141 08/13/2016 1120   K 4.5 04/20/2022 1102   K 3.4 (L) 08/13/2016 1120   CL 105 04/20/2022 1102   CO2 31 04/20/2022 1102   CO2 25 08/13/2016 1120   BUN 40 (H) 04/20/2022 1102   BUN 15.8 08/13/2016 1120   CREATININE 1.40 (H) 04/20/2022 1102   CREATININE 1.1 08/13/2016 1120      Component Value Date/Time   CALCIUM 10.3 04/20/2022 1102   CALCIUM 9.1 08/13/2016 1120   ALKPHOS 53 04/20/2022 1102   ALKPHOS 68 08/13/2016 1120   AST 16 04/20/2022 1102   AST 20 08/13/2016 1120   ALT 20 04/20/2022 1102   ALT 14 08/13/2016 1120   BILITOT 0.4 04/20/2022 1102   BILITOT 0.47 08/13/2016 1120       RADIOGRAPHIC STUDIES: CT Chest Wo Contrast  Result Date: 04/21/2022 CLINICAL DATA:  Lung cancer 6 years ago. Completed XRT. Shortness of breath and chest pain. * Tracking Code: BO * EXAM: CT CHEST WITHOUT CONTRAST TECHNIQUE: Multidetector CT imaging of the chest was performed following the standard protocol without IV contrast. RADIATION DOSE REDUCTION: This exam was performed according to the departmental dose-optimization program which includes automated exposure control, adjustment of the mA and/or kV according to patient size and/or use of iterative reconstruction technique. COMPARISON:  CT 04/17/2021 and older FINDINGS: Cardiovascular: The heart is nonenlarged. No pericardial effusion. Scattered coronary artery calcifications. The thoracic aorta on this noncontrast examination has diffuse calcified plaque as well. Stable contour. There is some enlargement of the main pulmonary arteries. Please correlate for pulmonary artery hypertension. Mediastinum/Nodes: Small hiatal hernia. Stable thyroid gland. On this non IV contrast exam there is no specific  abnormal lymph node enlargement seen in the axillary region, hilum or mediastinum. Lungs/Pleura: Breathing motion seen. Emphysematous lung changes identified, centrilobular. The right lung is  without consolidation, pneumothorax or effusion. Slight thickening along the fissures. Left lung has some volume loss. There is fiduciary marker in the posterior left upper lobe with some adjacent bandlike changes. However there is a developing nodule along the posterior aspect of the left upper lobe on series 5, image 75 measuring 13 by 15 mm today. A new lung mass is possible recommend further evaluation. Upper Abdomen: Along the upper abdomen the adrenal glands are grossly preserved but incompletely included in the imaging field. Stable right-sided renal cyst again identified at the edge of the imaging field incompletely included. Again no specific imaging follow-up. Musculoskeletal: Scattered degenerative changes of the spine. Osteopenia. There is some compression of the superior endplate of T6 which is new from previous examination. There is also new Schmorl's node deformity along the superior endplate of 624THL and maturing compression changes of L1. IMPRESSION: Posttreatment changes involving the left lung with a developing soft tissue nodule in this location measuring 15 mm. Recommend further evaluation. Recommend further evaluation with the patient's clinical history. PET-CT scan may be of benefit as 1 option. Three-month short-term follow-up may also be an option. This recommendation follows the consensus statement: Guidelines for Management of Incidental Pulmonary Nodules Detected on CT Images: From the Fleischner Society 2017; Radiology 2017; 284:228-243. No developing lymph node enlargement. New mild compression of the superior endplate of T6 with sclerosis. Previous compression has been regarding interval maturity in the upper lumbar spine at L1. Please correlate with clinical history. Aortic Atherosclerosis  (ICD10-I70.0) and Emphysema (ICD10-J43.9). Electronically Signed   By: Jill Side M.D.   On: 04/21/2022 14:29     ASSESSMENT AND PLAN: This is a very pleasant 77 years old white female with a stage IA non-small cell lung cancer, adenocarcinoma presented with left upper lobe lung nodule. The patient is status post stereotactic radiotherapy to the left upper lobe lung nodule and she tolerated the procedure well. The patient is currently on observation and she continues to have shortness of breath at baseline increased with exertion and currently on home oxygen.  She has left-sided chest pain that could be secondary to the T6 compression fracture with radiation to the front. She had repeat CT scan of the chest performed recently.  I personally and independently reviewed the scan images and discussed the result with the patient and her husband. Unfortunately her scan showed new soft tissue nodule developing in the left lung at the area of the posttreatment changes and concerning for disease recurrence. I recommended for her to have a PET scan for further evaluation of this lesion and to rule out disease recurrence. I will see her back for follow-up visit in 3 weeks for evaluation and discussion of her PET scan results and further recommendation regarding her condition. She was advised to call immediately if she has any other concerning symptoms interval. The patient voices understanding of current disease status and treatment options and is in agreement with the current care plan. All questions were answered. The patient knows to call the clinic with any problems, questions or concerns. We can certainly see the patient much sooner if necessary.  Disclaimer: This note was dictated with voice recognition software. Similar sounding words can inadvertently be transcribed and may not be corrected upon review.

## 2022-04-30 ENCOUNTER — Telehealth: Payer: Self-pay | Admitting: Internal Medicine

## 2022-04-30 NOTE — Telephone Encounter (Signed)
Spoke with patient confirming upcoming appointments  

## 2022-05-04 ENCOUNTER — Encounter: Payer: Self-pay | Admitting: Physician Assistant

## 2022-05-04 ENCOUNTER — Ambulatory Visit (INDEPENDENT_AMBULATORY_CARE_PROVIDER_SITE_OTHER): Payer: Medicare PPO | Admitting: Physician Assistant

## 2022-05-04 DIAGNOSIS — G8929 Other chronic pain: Secondary | ICD-10-CM | POA: Diagnosis not present

## 2022-05-04 DIAGNOSIS — M5442 Lumbago with sciatica, left side: Secondary | ICD-10-CM | POA: Diagnosis not present

## 2022-05-04 NOTE — Progress Notes (Signed)
Office Visit Note   Patient: Suzanne Dickson           Date of Birth: 02/26/1945           MRN: [id number] Visit Date: 05/04/2022              Requested by: Dot Been, FNP [address] [place],  [place] [address] PCP: Dot Been, FNP   Assessment & Plan: Visit Diagnoses: No diagnosis found.  Plan: [name] is a pleasant 77 year old woman who is accompanied by her husband today.  She was seen and evaluated in December for pain in her back.  X-rays at that time demonstrated findings consistent with old compression fractures.  She still continues to have back pain and gives description of cramping in her lower legs.  Also tires easily when she is walking.  She does also have a history of lung cancer however it is not the shortness of breath that stops her.  Given her x-ray findings and no improvement in the last few months I would recommend an MRI.  Could see if she has some stenosis or facet arthropathy that would respond well to an injection.  Follow-Up Instructions: No follow-ups on file.   Orders:  No orders of the defined types were placed in this encounter.  No orders of the defined types were placed in this encounter.     Procedures: No procedures performed   Clinical Data: No additional findings.   Subjective: Chief Complaint  Patient presents with   Lower Back - Pain    HPI Pleasant 77 year old woman accompanied by her husband who presents today with low back pain.  She also gets cramping in her lower legs.  She also gets fatigue in her legs when she walks.  She is currently undergoing a workup for recurrent lung cancer. Review of Systems  All other systems reviewed and are negative.    Objective: Vital Signs: There were no vitals taken for this visit.  Physical Exam Constitutional:      Appearance: Normal appearance.  Skin:    General: Skin is warm and dry.  Neurological:     General: No focal deficit present.     Mental Status: She is  alert.  Psychiatric:        Mood and Affect: Mood normal.     Ortho Exam Examination she is sitting in a comfortable in the wheelchair.  She does however say that at home she does ambulate with a rollator.  Her strength of her lower extremities is actually quite good with dorsiflexion and plantarflexion of her ankles extension and flexion of her knees.  No real reproduction of pain with straight leg raise.  She does have some tenderness to palpation in the lower back.  Compartments are soft and nontender.  She has no pain in her hip or groin. Specialty Comments:  No specialty comments available.  Imaging: No results found.   PMFS History: Patient Active Problem List   Diagnosis Date Noted   Pneumonia due to COVID-19 virus 09/16/2020   Acute on chronic respiratory failure with hypoxia and hypercapnia 09/16/2020   Hypokalemia 09/16/2020   Macrocytic anemia 09/16/2020   Abnormal EKG 09/16/2020   Abnormal CXR 09/16/2020   OSA on CPAP    CAD (coronary artery disease)    Anemia of chronic disease    Pulmonary arterial hypertension 11/25/2018   COPD exacerbation 11/24/2018   Acute on chronic respiratory failure with hypoxia 11/24/2018   Leukocytosis  11/24/2018   Iron deficiency anemia due to chronic blood loss 11/24/2018   Prolonged Q-T interval on ECG 11/24/2018   Unstable angina 04/05/2017   Adenocarcinoma of left lung, stage 1 04/15/2016   Solitary pulmonary nodule on lung CT 11/20/2015   Atherosclerosis of native arteries of extremity with intermittent claudication 10/23/2015   COPD GOLD III/ 02 dep  10/10/2014   Chronic respiratory failure with hypoxia 10/10/2014   Acute bronchitis 09/02/2014   Essential hypertension 06/07/2009   RHINOSINUSITIS, CHRONIC 06/04/2009   Past Medical History:  Diagnosis Date   Adenocarcinoma of left lung, 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   "I'm in remission" (04/05/2017)   Adenocarcinoma, lung ([facility])    Anxiety    Arthritis    "legs, elbows"  (04/05/2017)   Bronchitis, chronic ([facility])    "haven't had it in awhile" (04/05/2017)   CHF (congestive heart failure) ([facility])    COPD (chronic obstructive pulmonary disease) ([facility])    Coronary artery disease    Daily headache    "recently" (04/05/2017)   Depression    Dyspnea    with exertion   GERD (gastroesophageal reflux disease)    History of blood transfusion 1950s   History of radiation therapy 05/04/16 - 05/12/16   Left lung treated to 54 Gy with 3 fx of 18 Gy   Hypertension    On home oxygen therapy    "2L; 24/7" (04/05/2017)   OSA (obstructive sleep apnea)    OSA on CPAP    Pneumonia 10/2015   "at least 3 times" (04/05/2017)   Pulmonary hypertension ([facility])     Family History  Problem Relation Age of Onset   Heart attack Mother    Heart attack Father     Past Surgical History:  Procedure Laterality Date   COLONOSCOPY     CORONARY ANGIOPLASTY WITH STENT PLACEMENT  04/05/2017   CORONARY STENT INTERVENTION N/A 04/05/2017   Procedure: CORONARY STENT INTERVENTION;  Surgeon: Rinaldo Cloud, MD;  [facility];  Service: Cardiovascular;  Laterality: N/A;   DILATION AND CURETTAGE OF UTERUS     FUDUCIAL PLACEMENT Left 03/31/2016   Procedure: PLACEMENT OF FUDUCIAL LEFT UPPER LOBE;  Surgeon: Leslye Peer, MD;  Location: [facility];  Service: Thoracic;  Laterality: Left;   LAPAROSCOPIC CHOLECYSTECTOMY     LEFT HEART CATH AND CORONARY ANGIOGRAPHY N/A 02/17/2017   Procedure: LEFT HEART CATH AND CORONARY ANGIOGRAPHY;  Surgeon: Rinaldo Cloud, MD;  [facility];  Service: Cardiovascular;  Laterality: N/A;   SINUSOTOMY     TONSILLECTOMY     TUBAL LIGATION     VIDEO BRONCHOSCOPY WITH ENDOBRONCHIAL NAVIGATION N/A 03/31/2016   Procedure: VIDEO BRONCHOSCOPY WITH ENDOBRONCHIAL NAVIGATION;  Surgeon: Leslye Peer, MD;  Location[facility];  Service: Thoracic;  Laterality: N/A;   Social History   Occupational History   Not on file  Tobacco Use   Smoking status: Former     Packs/day: 1.00    Years: 40.00    Additional pack years: 0.00    Total pack years: 40.00    Types: Cigarettes    Quit date: 09/02/2014    Years since quitting: 7.6   Smokeless tobacco: Never  Vaping Use   Vaping Use: Never used  Substance and Sexual Activity   Alcohol use: No    Alcohol/week: 0.0 standard drinks of alcohol   Drug use: Never   Sexual activity: Not Currently

## 2022-05-06 DIAGNOSIS — J42 Unspecified chronic bronchitis: Secondary | ICD-10-CM | POA: Diagnosis not present

## 2022-05-20 ENCOUNTER — Encounter (HOSPITAL_COMMUNITY)
Admission: RE | Admit: 2022-05-20 | Discharge: 2022-05-20 | Disposition: A | Discharge status: Home / Self Care | Payer: Medicare PPO | Source: Ambulatory Visit | Attending: Internal Medicine | Admitting: Internal Medicine

## 2022-05-20 DIAGNOSIS — C3490 Malignant neoplasm of unspecified part of unspecified bronchus or lung: Secondary | ICD-10-CM | POA: Diagnosis not present

## 2022-05-20 DIAGNOSIS — C3412 Malignant neoplasm of upper lobe, left bronchus or lung: Secondary | ICD-10-CM | POA: Diagnosis not present

## 2022-05-20 LAB — GLUCOSE, CAPILLARY: Glucose-Capillary: 127 mg/dL — ABNORMAL HIGH (ref 70–99)

## 2022-05-20 MED ORDER — FLUDEOXYGLUCOSE F - 18 (FDG) INJECTION
8.0000 | Freq: Once | INTRAVENOUS | Status: AC
  Administered 2022-05-20: 8 via INTRAVENOUS

## 2022-05-21 ENCOUNTER — Other Ambulatory Visit: Payer: Self-pay | Admitting: Internal Medicine

## 2022-05-21 ENCOUNTER — Telehealth: Payer: Self-pay | Admitting: Internal Medicine

## 2022-05-21 NOTE — Telephone Encounter (Signed)
Called the pt and there was no answer, unable to leave msg. Will call back. I have refilled her albuterol inhaler.

## 2022-05-24 ENCOUNTER — Inpatient Hospital Stay (HOSPITAL_COMMUNITY)
Admission: EM | Admit: 2022-05-24 | Discharge: 2022-05-31 | DRG: 871 | Disposition: A | Discharge status: Home / Self Care | Payer: Medicare PPO | Attending: Internal Medicine | Admitting: Internal Medicine

## 2022-05-24 ENCOUNTER — Other Ambulatory Visit: Payer: Self-pay

## 2022-05-24 ENCOUNTER — Encounter (HOSPITAL_COMMUNITY): Payer: Self-pay | Admitting: Emergency Medicine

## 2022-05-24 ENCOUNTER — Emergency Department (HOSPITAL_COMMUNITY): Payer: Medicare PPO

## 2022-05-24 DIAGNOSIS — R6520 Severe sepsis without septic shock: Secondary | ICD-10-CM | POA: Diagnosis present

## 2022-05-24 DIAGNOSIS — I2510 Atherosclerotic heart disease of native coronary artery without angina pectoris: Secondary | ICD-10-CM | POA: Diagnosis present

## 2022-05-24 DIAGNOSIS — A4159 Other Gram-negative sepsis: Secondary | ICD-10-CM | POA: Diagnosis not present

## 2022-05-24 DIAGNOSIS — Z87891 Personal history of nicotine dependence: Secondary | ICD-10-CM

## 2022-05-24 DIAGNOSIS — J9611 Chronic respiratory failure with hypoxia: Secondary | ICD-10-CM

## 2022-05-24 DIAGNOSIS — Z955 Presence of coronary angioplasty implant and graft: Secondary | ICD-10-CM

## 2022-05-24 DIAGNOSIS — N281 Cyst of kidney, acquired: Secondary | ICD-10-CM | POA: Diagnosis not present

## 2022-05-24 DIAGNOSIS — Z1152 Encounter for screening for COVID-19: Secondary | ICD-10-CM | POA: Diagnosis not present

## 2022-05-24 DIAGNOSIS — I10 Essential (primary) hypertension: Secondary | ICD-10-CM | POA: Diagnosis not present

## 2022-05-24 DIAGNOSIS — R54 Age-related physical debility: Secondary | ICD-10-CM | POA: Diagnosis present

## 2022-05-24 DIAGNOSIS — E785 Hyperlipidemia, unspecified: Secondary | ICD-10-CM | POA: Diagnosis present

## 2022-05-24 DIAGNOSIS — D6959 Other secondary thrombocytopenia: Secondary | ICD-10-CM | POA: Diagnosis present

## 2022-05-24 DIAGNOSIS — J449 Chronic obstructive pulmonary disease, unspecified: Secondary | ICD-10-CM | POA: Diagnosis not present

## 2022-05-24 DIAGNOSIS — R000 Tachycardia, unspecified: Secondary | ICD-10-CM | POA: Diagnosis not present

## 2022-05-24 DIAGNOSIS — G9341 Metabolic encephalopathy: Secondary | ICD-10-CM | POA: Diagnosis present

## 2022-05-24 DIAGNOSIS — F32A Depression, unspecified: Secondary | ICD-10-CM | POA: Diagnosis present

## 2022-05-24 DIAGNOSIS — C3492 Malignant neoplasm of unspecified part of left bronchus or lung: Secondary | ICD-10-CM | POA: Diagnosis not present

## 2022-05-24 DIAGNOSIS — Z7189 Other specified counseling: Secondary | ICD-10-CM | POA: Diagnosis not present

## 2022-05-24 DIAGNOSIS — Z515 Encounter for palliative care: Secondary | ICD-10-CM

## 2022-05-24 DIAGNOSIS — J441 Chronic obstructive pulmonary disease with (acute) exacerbation: Secondary | ICD-10-CM | POA: Diagnosis present

## 2022-05-24 DIAGNOSIS — Z66 Do not resuscitate: Secondary | ICD-10-CM | POA: Diagnosis present

## 2022-05-24 DIAGNOSIS — Z79899 Other long term (current) drug therapy: Secondary | ICD-10-CM

## 2022-05-24 DIAGNOSIS — Z7982 Long term (current) use of aspirin: Secondary | ICD-10-CM

## 2022-05-24 DIAGNOSIS — E860 Dehydration: Secondary | ICD-10-CM | POA: Diagnosis present

## 2022-05-24 DIAGNOSIS — R531 Weakness: Secondary | ICD-10-CM | POA: Diagnosis not present

## 2022-05-24 DIAGNOSIS — N10 Acute tubulo-interstitial nephritis: Secondary | ICD-10-CM | POA: Diagnosis present

## 2022-05-24 DIAGNOSIS — Z923 Personal history of irradiation: Secondary | ICD-10-CM

## 2022-05-24 DIAGNOSIS — Z91199 Patient's noncompliance with other medical treatment and regimen due to unspecified reason: Secondary | ICD-10-CM

## 2022-05-24 DIAGNOSIS — C3490 Malignant neoplasm of unspecified part of unspecified bronchus or lung: Secondary | ICD-10-CM | POA: Diagnosis not present

## 2022-05-24 DIAGNOSIS — N390 Urinary tract infection, site not specified: Secondary | ICD-10-CM

## 2022-05-24 DIAGNOSIS — J9622 Acute and chronic respiratory failure with hypercapnia: Secondary | ICD-10-CM | POA: Diagnosis not present

## 2022-05-24 DIAGNOSIS — J9621 Acute and chronic respiratory failure with hypoxia: Secondary | ICD-10-CM | POA: Diagnosis present

## 2022-05-24 DIAGNOSIS — Z6829 Body mass index (BMI) 29.0-29.9, adult: Secondary | ICD-10-CM

## 2022-05-24 DIAGNOSIS — I130 Hypertensive heart and chronic kidney disease with heart failure and stage 1 through stage 4 chronic kidney disease, or unspecified chronic kidney disease: Secondary | ICD-10-CM | POA: Diagnosis present

## 2022-05-24 DIAGNOSIS — Z8249 Family history of ischemic heart disease and other diseases of the circulatory system: Secondary | ICD-10-CM

## 2022-05-24 DIAGNOSIS — D539 Nutritional anemia, unspecified: Secondary | ICD-10-CM | POA: Diagnosis not present

## 2022-05-24 DIAGNOSIS — R140 Abdominal distension (gaseous): Secondary | ICD-10-CM | POA: Diagnosis present

## 2022-05-24 DIAGNOSIS — I272 Pulmonary hypertension, unspecified: Secondary | ICD-10-CM | POA: Diagnosis present

## 2022-05-24 DIAGNOSIS — I5033 Acute on chronic diastolic (congestive) heart failure: Secondary | ICD-10-CM | POA: Diagnosis not present

## 2022-05-24 DIAGNOSIS — N1832 Chronic kidney disease, stage 3b: Secondary | ICD-10-CM | POA: Diagnosis present

## 2022-05-24 DIAGNOSIS — R1111 Vomiting without nausea: Secondary | ICD-10-CM | POA: Diagnosis not present

## 2022-05-24 DIAGNOSIS — B964 Proteus (mirabilis) (morganii) as the cause of diseases classified elsewhere: Secondary | ICD-10-CM | POA: Diagnosis present

## 2022-05-24 DIAGNOSIS — Z9981 Dependence on supplemental oxygen: Secondary | ICD-10-CM

## 2022-05-24 DIAGNOSIS — K219 Gastro-esophageal reflux disease without esophagitis: Secondary | ICD-10-CM | POA: Diagnosis present

## 2022-05-24 DIAGNOSIS — G4733 Obstructive sleep apnea (adult) (pediatric): Secondary | ICD-10-CM | POA: Diagnosis not present

## 2022-05-24 DIAGNOSIS — D631 Anemia in chronic kidney disease: Secondary | ICD-10-CM | POA: Diagnosis present

## 2022-05-24 DIAGNOSIS — Z85118 Personal history of other malignant neoplasm of bronchus and lung: Secondary | ICD-10-CM

## 2022-05-24 DIAGNOSIS — A419 Sepsis, unspecified organism: Secondary | ICD-10-CM | POA: Diagnosis not present

## 2022-05-24 DIAGNOSIS — R0602 Shortness of breath: Secondary | ICD-10-CM | POA: Diagnosis not present

## 2022-05-24 DIAGNOSIS — Z7902 Long term (current) use of antithrombotics/antiplatelets: Secondary | ICD-10-CM

## 2022-05-24 DIAGNOSIS — F419 Anxiety disorder, unspecified: Secondary | ICD-10-CM | POA: Diagnosis present

## 2022-05-24 DIAGNOSIS — Z7952 Long term (current) use of systemic steroids: Secondary | ICD-10-CM

## 2022-05-24 DIAGNOSIS — R4182 Altered mental status, unspecified: Secondary | ICD-10-CM | POA: Diagnosis not present

## 2022-05-24 DIAGNOSIS — K5900 Constipation, unspecified: Secondary | ICD-10-CM | POA: Diagnosis present

## 2022-05-24 DIAGNOSIS — I5031 Acute diastolic (congestive) heart failure: Secondary | ICD-10-CM | POA: Diagnosis not present

## 2022-05-24 LAB — CBC WITH DIFFERENTIAL/PLATELET
Abs Immature Granulocytes: 0.15 10*3/uL — ABNORMAL HIGH (ref 0.00–0.07)
Basophils Absolute: 0 10*3/uL (ref 0.0–0.1)
Basophils Relative: 0 %
Eosinophils Absolute: 0 10*3/uL (ref 0.0–0.5)
Eosinophils Relative: 0 %
HCT: 36.6 % (ref 36.0–46.0)
Hemoglobin: 10.8 g/dL — ABNORMAL LOW (ref 12.0–15.0)
Immature Granulocytes: 1 %
Lymphocytes Relative: 4 %
Lymphs Abs: 0.7 10*3/uL (ref 0.7–4.0)
MCH: 30.7 pg (ref 26.0–34.0)
MCHC: 29.5 g/dL — ABNORMAL LOW (ref 30.0–36.0)
MCV: 104 fL — ABNORMAL HIGH (ref 80.0–100.0)
Monocytes Absolute: 1 10*3/uL (ref 0.1–1.0)
Monocytes Relative: 6 %
Neutro Abs: 14.1 10*3/uL — ABNORMAL HIGH (ref 1.7–7.7)
Neutrophils Relative %: 89 %
Platelets: 117 10*3/uL — ABNORMAL LOW (ref 150–400)
RBC: 3.52 MIL/uL — ABNORMAL LOW (ref 3.87–5.11)
RDW: 11.9 % (ref 11.5–15.5)
WBC: 16 10*3/uL — ABNORMAL HIGH (ref 4.0–10.5)
nRBC: 0.1 % (ref 0.0–0.2)

## 2022-05-24 LAB — BRAIN NATRIURETIC PEPTIDE: B Natriuretic Peptide: 62.2 pg/mL (ref 0.0–100.0)

## 2022-05-24 LAB — COMPREHENSIVE METABOLIC PANEL
ALT: 36 U/L (ref 0–44)
AST: 39 U/L (ref 15–41)
Albumin: 3.4 g/dL — ABNORMAL LOW (ref 3.5–5.0)
Alkaline Phosphatase: 64 U/L (ref 38–126)
Anion gap: 14 (ref 5–15)
BUN: 23 mg/dL (ref 8–23)
CO2: 26 mmol/L (ref 22–32)
Calcium: 8.9 mg/dL (ref 8.9–10.3)
Chloride: 96 mmol/L — ABNORMAL LOW (ref 98–111)
Creatinine, Ser: 1.47 mg/dL — ABNORMAL HIGH (ref 0.44–1.00)
GFR, Estimated: 37 mL/min — ABNORMAL LOW (ref >60)
Glucose, Bld: 153 mg/dL — ABNORMAL HIGH (ref 70–99)
Potassium: 3.7 mmol/L (ref 3.5–5.1)
Sodium: 136 mmol/L (ref 135–145)
Total Bilirubin: 0.8 mg/dL (ref 0.3–1.2)
Total Protein: 6.6 g/dL (ref 6.5–8.1)

## 2022-05-24 LAB — D-DIMER, QUANTITATIVE: D-Dimer, Quant: 1.95 ug/mL-FEU — ABNORMAL HIGH (ref 0.00–0.50)

## 2022-05-24 LAB — URINALYSIS, ROUTINE W REFLEX MICROSCOPIC
Bilirubin Urine: NEGATIVE
Glucose, UA: >=500 mg/dL — AB
Ketones, ur: NEGATIVE mg/dL
Nitrite: POSITIVE — AB
Protein, ur: NEGATIVE mg/dL
Specific Gravity, Urine: 1.013 (ref 1.005–1.030)
pH: 7 (ref 5.0–8.0)

## 2022-05-24 LAB — BLOOD GAS, ARTERIAL
Acid-base deficit: 0.1 mmol/L (ref 0.0–2.0)
Bicarbonate: 25.4 mmol/L (ref 20.0–28.0)
Drawn by: 68289
O2 Saturation: 99.3 %
Patient temperature: 37
pCO2 arterial: 44 mmHg (ref 32–48)
pH, Arterial: 7.37 (ref 7.35–7.45)
pO2, Arterial: 110 mmHg — ABNORMAL HIGH (ref 83–108)

## 2022-05-24 LAB — LIPASE, BLOOD: Lipase: 29 U/L (ref 11–51)

## 2022-05-24 LAB — SARS CORONAVIRUS 2 BY RT PCR: SARS Coronavirus 2 by RT PCR: NEGATIVE

## 2022-05-24 LAB — TROPONIN I (HIGH SENSITIVITY)
Troponin I (High Sensitivity): 44 ng/L — ABNORMAL HIGH (ref <18)
Troponin I (High Sensitivity): 39 ng/L — ABNORMAL HIGH (ref <18)

## 2022-05-24 LAB — LACTIC ACID, PLASMA
Lactic Acid, Venous: 2 mmol/L (ref 0.5–1.9)
Lactic Acid, Venous: 2.6 mmol/L (ref 0.5–1.9)

## 2022-05-24 LAB — PROCALCITONIN: Procalcitonin: 22.47 ng/mL

## 2022-05-24 LAB — ETHANOL: Alcohol, Ethyl (B): <10 mg/dL (ref <10)

## 2022-05-24 MED ORDER — AMLODIPINE BESYLATE 5 MG PO TABS
5.0000 mg | ORAL_TABLET | Freq: Every day | ORAL | Status: DC
  Administered 2022-05-25 – 2022-05-31 (×7): 5 mg via ORAL
  Filled 2022-05-24 (×8): qty 1

## 2022-05-24 MED ORDER — IOHEXOL 350 MG/ML SOLN
60.0000 mL | Freq: Once | INTRAVENOUS | Status: AC | PRN
  Administered 2022-05-24: 60 mL via INTRAVENOUS

## 2022-05-24 MED ORDER — SODIUM CHLORIDE 0.9 % IV SOLN
2.0000 g | INTRAVENOUS | Status: AC
  Administered 2022-05-24 – 2022-05-30 (×7): 2 g via INTRAVENOUS
  Filled 2022-05-24 (×7): qty 20

## 2022-05-24 MED ORDER — UMECLIDINIUM BROMIDE 62.5 MCG/ACT IN AEPB
1.0000 | INHALATION_SPRAY | Freq: Every day | RESPIRATORY_TRACT | Status: DC
  Filled 2022-05-24: qty 7

## 2022-05-24 MED ORDER — ASPIRIN 81 MG PO TBEC
81.0000 mg | DELAYED_RELEASE_TABLET | Freq: Every day | ORAL | Status: DC
  Administered 2022-05-25 – 2022-05-31 (×7): 81 mg via ORAL
  Filled 2022-05-24 (×8): qty 1

## 2022-05-24 MED ORDER — POLYETHYLENE GLYCOL 3350 17 G PO PACK: 17.0000 g | PACK | Freq: Every day | ORAL | Status: DC | PRN

## 2022-05-24 MED ORDER — ACETAMINOPHEN 500 MG PO TABS
1000.0000 mg | ORAL_TABLET | Freq: Once | ORAL | Status: AC
  Administered 2022-05-24: 1000 mg via ORAL
  Filled 2022-05-24: qty 2

## 2022-05-24 MED ORDER — ATORVASTATIN CALCIUM 10 MG PO TABS
20.0000 mg | ORAL_TABLET | Freq: Every day | ORAL | Status: DC
  Administered 2022-05-24 – 2022-05-30 (×7): 20 mg via ORAL
  Filled 2022-05-24 (×7): qty 2

## 2022-05-24 MED ORDER — LACTATED RINGERS IV SOLN: INTRAVENOUS | Status: DC

## 2022-05-24 MED ORDER — SODIUM CHLORIDE 0.9% FLUSH
3.0000 mL | Freq: Two times a day (BID) | INTRAVENOUS | Status: DC
  Administered 2022-05-24 – 2022-05-31 (×13): 3 mL via INTRAVENOUS

## 2022-05-24 MED ORDER — ONDANSETRON HCL 4 MG/2ML IJ SOLN
4.0000 mg | Freq: Once | INTRAMUSCULAR | Status: AC
  Administered 2022-05-24: 4 mg via INTRAVENOUS
  Filled 2022-05-24: qty 2

## 2022-05-24 MED ORDER — VANCOMYCIN HCL 1500 MG/300ML IV SOLN
1500.0000 mg | Freq: Once | INTRAVENOUS | Status: AC
  Administered 2022-05-24: 1500 mg via INTRAVENOUS
  Filled 2022-05-24: qty 300

## 2022-05-24 MED ORDER — MORPHINE SULFATE (PF) 4 MG/ML IV SOLN
4.0000 mg | Freq: Once | INTRAVENOUS | Status: AC
  Administered 2022-05-24: 4 mg via INTRAVENOUS
  Filled 2022-05-24: qty 1

## 2022-05-24 MED ORDER — METHOCARBAMOL 500 MG PO TABS
500.0000 mg | ORAL_TABLET | Freq: Three times a day (TID) | ORAL | Status: DC | PRN
  Administered 2022-05-27 – 2022-05-30 (×4): 500 mg via ORAL
  Filled 2022-05-24 (×4): qty 1

## 2022-05-24 MED ORDER — SODIUM CHLORIDE 0.9 % IV SOLN
2.0000 g | Freq: Once | INTRAVENOUS | Status: AC
  Administered 2022-05-24: 2 g via INTRAVENOUS
  Filled 2022-05-24: qty 12.5

## 2022-05-24 MED ORDER — LACTATED RINGERS IV BOLUS
1000.0000 mL | Freq: Once | INTRAVENOUS | Status: AC
  Administered 2022-05-24: 1000 mL via INTRAVENOUS

## 2022-05-24 MED ORDER — ACETAMINOPHEN 650 MG RE SUPP
650.0000 mg | Freq: Four times a day (QID) | RECTAL | Status: DC | PRN
  Administered 2022-05-25: 650 mg via RECTAL
  Filled 2022-05-24: qty 1

## 2022-05-24 MED ORDER — ENOXAPARIN SODIUM 30 MG/0.3ML IJ SOSY
30.0000 mg | PREFILLED_SYRINGE | INTRAMUSCULAR | Status: DC
  Administered 2022-05-24 – 2022-05-27 (×4): 30 mg via SUBCUTANEOUS
  Filled 2022-05-24 (×5): qty 0.3

## 2022-05-24 MED ORDER — CLOPIDOGREL BISULFATE 75 MG PO TABS
75.0000 mg | ORAL_TABLET | Freq: Every day | ORAL | Status: DC
  Administered 2022-05-25 – 2022-05-31 (×7): 75 mg via ORAL
  Filled 2022-05-24 (×8): qty 1

## 2022-05-24 MED ORDER — METRONIDAZOLE 500 MG/100ML IV SOLN
500.0000 mg | Freq: Once | INTRAVENOUS | Status: AC
  Administered 2022-05-24: 500 mg via INTRAVENOUS
  Filled 2022-05-24: qty 100

## 2022-05-24 MED ORDER — ALBUTEROL SULFATE (2.5 MG/3ML) 0.083% IN NEBU
2.5000 mg | INHALATION_SOLUTION | RESPIRATORY_TRACT | Status: DC | PRN
  Administered 2022-05-24: 2.5 mg via RESPIRATORY_TRACT
  Filled 2022-05-24: qty 3

## 2022-05-24 MED ORDER — IPRATROPIUM-ALBUTEROL 0.5-2.5 (3) MG/3ML IN SOLN
3.0000 mL | Freq: Once | RESPIRATORY_TRACT | Status: AC
  Administered 2022-05-24: 3 mL via RESPIRATORY_TRACT
  Filled 2022-05-24: qty 3

## 2022-05-24 MED ORDER — FLUTICASONE FUROATE-VILANTEROL 100-25 MCG/ACT IN AEPB: 1.0000 | INHALATION_SPRAY | Freq: Every day | RESPIRATORY_TRACT | Status: DC

## 2022-05-24 MED ORDER — METOPROLOL SUCCINATE ER 50 MG PO TB24
25.0000 mg | ORAL_TABLET | Freq: Every day | ORAL | Status: DC
  Filled 2022-05-24: qty 1

## 2022-05-24 MED ORDER — PANTOPRAZOLE SODIUM 40 MG PO TBEC
40.0000 mg | DELAYED_RELEASE_TABLET | Freq: Every day | ORAL | Status: DC
  Administered 2022-05-26 – 2022-05-31 (×6): 40 mg via ORAL
  Filled 2022-05-24 (×7): qty 1

## 2022-05-24 MED ORDER — ACETAMINOPHEN 325 MG PO TABS
650.0000 mg | ORAL_TABLET | Freq: Four times a day (QID) | ORAL | Status: DC | PRN
  Administered 2022-05-24 – 2022-05-28 (×4): 650 mg via ORAL
  Filled 2022-05-24 (×5): qty 2

## 2022-05-24 MED ORDER — PREDNISONE 5 MG PO TABS
10.0000 mg | ORAL_TABLET | Freq: Every day | ORAL | Status: DC
  Filled 2022-05-24: qty 2

## 2022-05-24 NOTE — ED Notes (Signed)
Patient transported to CT 

## 2022-05-24 NOTE — ED Triage Notes (Signed)
Pt BIB [facility] from home due to altered mental status and no intake for the past two days.Baseline 4-6 oxygen nasal cannula.   VS BP 176/155, HR 129, Spo2 98%, Resp 42, CBG160.  Temp 99.7.

## 2022-05-24 NOTE — H&P (Signed)
History and Physical   Suzanne Dickson ZOX:[id number] DOB: [birth year] DOA: 05/24/2022  PCP: Dot Been, FNP   Patient coming from: Home  Chief Complaint: Altered mental status, nausea, vomiting, dysuria  HPI: Suzanne Dickson is a 77 y.o. female with medical history significant of OSA, anemia, hypertension, COPD, chronic respiratory failure on 4 to 6 L, CAD, QT prolongation, pulmonary hypertension, lung cancer presenting with altered mental status.  History obtained with assistance of chart review and family.  Patient has 2 days of nausea vomiting and decreased p.o. intake.  Also has reported crampy abdominal pain and dysuria.  Reports some chills.  Denies fever, chest pain, shortness of breath, abdominal pain, constipation, diarrhea.  ED Course: Signs in the ED notable for temperature of 99.8, blood pressure in the 110s to 140 systolic, heart rate in the 110s to 140s.  Respiratory rate in the 20s.  On home  3 to 4 L.  Lab workup included CMP with chloride 96, creatinine 1.47 up from baseline 1.2, glucose 153, albumin 3.4.  CBC with leukocytosis to 16, hemoglobin stable at 10.8, platelets 117.  Troponin mildly elevated at 44 with repeat pending.  Lactic acid borderline at 2.0 with repeat pending.  Lipase normal, BNP normal.  COVID screen pending.  Ethanol level negative.  Urinalysis pending.  Blood cultures pending.  Chest x-ray showed no acute normality, CT head showed no acute abnormality.  CT abdomen pelvis showed motion limited exam with enhancement at the left kidney with stranding suspicious for pyelonephritis if correlates clinically.  Also noted were renal cysts, chronic appearing compression deformities and vascular calcification.  Patient received vancomycin, cefepime, Flagyl in the ED as well as morphine, Zofran, 2 L of IV fluids and DuoNeb.  Review of Systems: As per HPI otherwise all other systems reviewed and are negative.  Past Medical History:  Diagnosis Date   Adenocarcinoma  of left lung, 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   "I'm in remission" (04/05/2017)   Adenocarcinoma, lung (HCC)    Anxiety    Arthritis    "legs, elbows" (04/05/2017)   Bronchitis, chronic (HCC)    "haven't had it in awhile" (04/05/2017)   CHF (congestive heart failure) (HCC)    COPD (chronic obstructive pulmonary disease) (HCC)    Coronary artery disease    Daily headache    "recently" (04/05/2017)   Depression    Dyspnea    with exertion   GERD (gastroesophageal reflux disease)    History of blood transfusion 1950s   History of radiation therapy 05/04/16 - 05/12/16   Left lung treated to 54 Gy with 3 fx of 18 Gy   Hypertension    On home oxygen therapy    "2L; 24/7" (04/05/2017)   OSA (obstructive sleep apnea)    OSA on CPAP    Pneumonia 10/2015   "at least 3 times" (04/05/2017)   Pulmonary hypertension (HCC)     Past Surgical History:  Procedure Laterality Date   COLONOSCOPY     CORONARY ANGIOPLASTY WITH STENT PLACEMENT  04/05/2017   CORONARY STENT INTERVENTION N/A 04/05/2017   Procedure: CORONARY STENT INTERVENTION;  Surgeon: Rinaldo Cloud, MD;  Location: [facility];  Service: Cardiovascular;  Laterality: N/A;   DILATION AND CURETTAGE OF UTERUS     FUDUCIAL PLACEMENT Left 03/31/2016   Procedure: PLACEMENT OF FUDUCIAL LEFT UPPER LOBE;  Surgeon: Leslye Peer, MD;  Location: [facility] OR;  Service: Thoracic;  Laterality: Left;   LAPAROSCOPIC CHOLECYSTECTOMY     LEFT  HEART CATH AND CORONARY ANGIOGRAPHY N/A 02/17/2017   Procedure: LEFT HEART CATH AND CORONARY ANGIOGRAPHY;  Surgeon: Rinaldo Cloud, MD;  Location: [facility];  Service: Cardiovascular;  Laterality: N/A;   SINUSOTOMY     TONSILLECTOMY     TUBAL LIGATION     VIDEO BRONCHOSCOPY WITH ENDOBRONCHIAL NAVIGATION N/A 03/31/2016   Procedure: VIDEO BRONCHOSCOPY WITH ENDOBRONCHIAL NAVIGATION;  Surgeon: Leslye Peer, MD;  Location: [facility] OR;  Service: Thoracic;  Laterality: N/A;    Social History  reports that she quit smoking  about 7 years ago. Her smoking use included cigarettes. She has a 40.00 pack-year smoking history. She has never used smokeless tobacco. She reports that she does not drink alcohol and does not use drugs.  No Known Allergies  Family History  Problem Relation Age of Onset   Heart attack Mother    Heart attack Father   Reviewed on admission  Prior to Admission medications   Medication Sig Start Date End Date Taking? Authorizing Provider  acetaminophen (TYLENOL) 500 MG tablet Take 1 tablet (500 mg total) by mouth every 6 (six) hours as needed. 03/22/21   Derwood Kaplan, MD  albuterol (PROVENTIL) (2.5 MG/3ML) 0.083% nebulizer solution Use every 4 hours and as needed for shortness of breath. 09/25/21   Nyoka Cowden, MD  albuterol (VENTOLIN HFA) 108 (90 Base) MCG/ACT inhaler INHALE 2 PUFFS BY MOUTH EVERY 4 HOURS AS NEEDED FOR WHEEZING FOR SHORTNESS OF BREATH 05/21/22   Nyoka Cowden, MD  amLODipine (NORVASC) 5 MG tablet Take 5 mg by mouth daily.    [provider]  aspirin EC 81 MG tablet Take 81 mg by mouth daily. Swallow whole.    [provider]  atorvastatin (LIPITOR) 20 MG tablet Take 20 mg by mouth at bedtime. 08/21/20   [provider]  Azelastine-Fluticasone 137-50 MCG/ACT SUSP 2 PUFFS INTO EACH NOSTRIL AT BEDTIME Patient not taking: Reported on 11/26/2021 04/22/20   Nyoka Cowden, MD  azithromycin (ZITHROMAX) 250 MG tablet Take 1 tablet (250 mg total) by mouth daily. 01/07/22   Nyoka Cowden, MD  benzonatate (TESSALON) 200 MG capsule Take 1 capsule (200 mg total) by mouth 3 (three) times daily as needed for cough. Patient not taking: Reported on 11/26/2021 09/22/20   Osvaldo Shipper, MD  cetirizine (ZYRTEC) 10 MG tablet Take 10 mg by mouth at bedtime.    [provider]  cholecalciferol (VITAMIN D3) 25 MCG (1000 UNIT) tablet Take 1,000 Units by mouth daily.    [provider]  clopidogrel (PLAVIX) 75 MG tablet Take 75 mg by mouth every  morning.     [provider]  Cyanocobalamin (B-12 PO) Take 1 tablet by mouth daily.    [provider]  famotidine (PEPCID) 20 MG tablet One after supper 02/12/19   Nyoka Cowden, MD  ferrous sulfate 325 (65 FE) MG EC tablet Take 325 mg by mouth 3 (three) times daily with meals.     [provider]  Fluticasone-Umeclidin-Vilant (TRELEGY ELLIPTA) 100-62.5-25 MCG/ACT AEPB INHALE 1 PUFF ONCE DAILY 03/02/22   Nyoka Cowden, MD  hydrochlorothiazide (MICROZIDE) 12.5 MG capsule Take 12.5 mg by mouth daily. Patient not taking: Reported on 11/26/2021    [provider]  ibuprofen (ADVIL) 200 MG tablet Take 400 mg by mouth every 6 (six) hours as needed (pain). Patient not taking: Reported on 11/26/2021    [provider]  Lidocaine 4 % PTCH Apply 1 patch topically 2 (  two) times daily. Patient not taking: Reported on 11/26/2021 03/22/21   Derwood Kaplan, MD  losartan (COZAAR) 50 MG tablet Take 50 mg by mouth every morning. 11/15/18   [provider]  methocarbamol (ROBAXIN) 500 MG tablet Take by mouth. 05/14/21   [provider]  metoprolol succinate (TOPROL-XL) 25 MG 24 hr tablet Take 25 mg by mouth daily.    [provider]  naproxen (NAPROSYN) 250 MG tablet Take 1 tablet (250 mg total) by mouth 2 (two) times daily with a meal. 03/22/21   Derwood Kaplan, MD  nitroGLYCERIN (NITRODUR - DOSED IN MG/24 HR) 0.4 mg/hr patch Place 0.4 mg onto the skin every morning. 08/10/17   [provider]  nitroGLYCERIN (NITROSTAT) 0.4 MG SL tablet Place 0.4 mg under the tongue every 5 (five) minutes as needed for chest pain.    [provider]  OVER THE COUNTER MEDICATION Take 1 tablet by mouth at bedtime. OTC sleep aid - unknown ingredients    [provider]  OVER THE COUNTER MEDICATION Take 3,000 mcg by mouth daily. Vitamin B12 tablet daily    [provider]  OXYGEN Inhale 4-6 L into the lungs continuous.     [provider]  pantoprazole (PROTONIX) 40 MG tablet Take 30-60 min before first meal of the day Patient taking differently: Take 40 mg by mouth daily before breakfast. 08/08/18   Nyoka Cowden, MD  Polyvinyl Alcohol-Povidone (REFRESH OP) Place 1 drop into both eyes daily as needed (dry eyes).    [provider]  predniSONE (DELTASONE) 10 MG tablet TAKE 2 TABLETS BY MOUTH ONCE DAILY UNTIL  FEELING  BETTER  THEN  TAKE  1  TABLET  DAILY 04/26/22   Nyoka Cowden, MD  sertraline (ZOLOFT) 100 MG tablet Take 100 mg by mouth daily.    [provider]  tiZANidine (ZANAFLEX) 4 MG tablet Take 1 tablet (4 mg total) by mouth every 8 (eight) hours as needed for muscle spasms. Patient not taking: Reported on 11/26/2021 03/22/21   Derwood Kaplan, MD    Physical Exam: Vitals:   05/24/22 1415 05/24/22 1430 05/24/22 1600 05/24/22 1610  BP: (!) 141/61 117/72  (!) 145/71  Pulse: (!) 115 (!) 119 (!) 134 (!) 133  Resp: (!) 26 (!) 28 (!) 23 (!) 26  Temp:      TempSrc:      SpO2: 99% 99% 96% 98%  Weight:      Height:        Physical Exam Constitutional:      General: She is not in acute distress.    Appearance: Normal appearance. She is ill-appearing.  HENT:     Head: Normocephalic and atraumatic.     Mouth/Throat:     Mouth: Mucous membranes are moist.     Pharynx: Oropharynx is clear.  Eyes:     Extraocular Movements: Extraocular movements intact.     Pupils: Pupils are equal, round, and reactive to light.  Cardiovascular:     Rate and Rhythm: Regular rhythm. Tachycardia present.     Pulses: Normal pulses.     Heart sounds: Normal heart sounds.  Pulmonary:     Effort: Pulmonary effort is normal. Tachypnea present. No respiratory distress.     Breath sounds: Wheezing present.     Comments: Mildly increased work of breathing Abdominal:     General: Bowel sounds are normal. There is no distension.     Palpations: Abdomen is soft.  Tenderness: There is no abdominal  tenderness.  Musculoskeletal:        General: No swelling or deformity.  Skin:    General: Skin is warm and dry.  Neurological:     General: No focal deficit present.     Mental Status: Mental status is at baseline.    Labs on Admission: I have personally reviewed following labs and imaging studies  CBC: Recent Labs  Lab 05/24/22 1340  WBC 16.0*  NEUTROABS 14.1*  HGB 10.8*  HCT 36.6  MCV 104.0*  PLT 117*    Basic Metabolic Panel: Recent Labs  Lab 05/24/22 1340  NA 136  K 3.7  CL 96*  CO2 26  GLUCOSE 153*  BUN 23  CREATININE 1.47*  CALCIUM 8.9    GFR: Estimated Creatinine Clearance: 30.7 mL/min (A) (by C-G formula based on SCr of 1.47 mg/dL (H)).  Liver Function Tests: Recent Labs  Lab 05/24/22 1340  AST 39  ALT 36  ALKPHOS 64  BILITOT 0.8  PROT 6.6  ALBUMIN 3.4*    Urine analysis:    Component Value Date/Time   COLORURINE STRAW (A) 05/24/2022 1531   APPEARANCEUR CLEAR 05/24/2022 1531   LABSPEC 1.013 05/24/2022 1531   PHURINE 7.0 05/24/2022 1531   GLUCOSEU >=500 (A) 05/24/2022 1531   HGBUR SMALL (A) 05/24/2022 1531   BILIRUBINUR NEGATIVE 05/24/2022 1531   KETONESUR NEGATIVE 05/24/2022 1531   PROTEINUR NEGATIVE 05/24/2022 1531   UROBILINOGEN 0.2 09/02/2014 2138   NITRITE POSITIVE (A) 05/24/2022 1531   LEUKOCYTESUR SMALL (A) 05/24/2022 1531    Radiological Exams on Admission: CT ABDOMEN PELVIS W CONTRAST  Result Date: 05/24/2022 CLINICAL DATA:  Status post XRT left-sided lung cancer. Right lower quadrant pain, vomiting and sepsis. EXAM: CT ABDOMEN AND PELVIS WITH CONTRAST TECHNIQUE: Multidetector CT imaging of the abdomen and pelvis was performed using the standard protocol following bolus administration of intravenous contrast. RADIATION DOSE REDUCTION: This exam was performed according to the departmental dose-optimization program which includes automated exposure control, adjustment of the mA and/or kV according to patient size and/or use of  iterative reconstruction technique. CONTRAST:  60mL OMNIPAQUE IOHEXOL 350 MG/ML SOLN COMPARISON:  PET-CT 05/20/2022 FINDINGS: Lower chest: Breathing motion at the lung bases. Mild scar or atelectasis. No pleural effusion. Coronary artery calcifications are seen. Coronary artery calcifications are seen. Small hiatal hernia. Hepatobiliary: Fatty liver infiltration. No obvious space-occupying liver lesion. Patent portal vein. Gallbladder is surgically absent Pancreas: Unremarkable. No pancreatic ductal dilatation or surrounding inflammatory changes. Spleen: Normal in size without focal abnormality. Adrenals/Urinary Tract: Adrenal glands are preserved. Multiple bilateral renal cysts are identified. Largest is seen upper pole on the right measuring 5.8 cm in diameter and Hounsfield unit of 10. Lower pole focus on the left as another example measures diameter 3.8 cm and has Hounsfield units of 7. Few other smaller foci identified. These are Bosniak 1 and 2 lesions. No specific imaging follow-up. However there is an ill-defined area in the anterior aspect of the left kidney best seen on delayed series 13, image 14 measuring 19 mm. Hounsfield units of 105 on delayed and 99 on portal venous phase. Margins are somewhat difficult to define as there is motion throughout the examination. There is some perinephric stranding. Based on appearance this could represent edema of the parenchyma and an area of pyelonephritis with the stranding. Please correlate with clinical presentation. Preserved contours of the urinary bladder. Stomach/Bowel: Sigmoid colon diverticula. Scattered colonic stool. Normal appendix. Stomach and small bowel are nondilated.  Vascular/Lymphatic: Extensive vascular calcifications identified along the aorta and branch vessels with areas of potential stenosis including the iliac vessels, SMA and renal arteries. Please correlate with the history. No specific abnormal lymph node enlargement present in the abdomen  and pelvis. Reproductive: Uterus and bilateral adnexa are unremarkable. Other: No abdominal wall hernia or abnormality. No abdominopelvic ascites. Musculoskeletal: Trace anterolisthesis of L3 on L4. Multilevel compression deformities along the spine with the margins are sclerotic. Favor a more chronic process. IMPRESSION: Study limited by motion throughout the examination. There is some ill-defined area enhancement along the left mid kidney with stranding. Possibilities would include an area of pyelonephritis. Please correlate with history. Otherwise would recommend follow up evaluation when the patient is more able to tolerate the study without motion. Separate bilateral benign-appearing renal cysts. No bowel obstruction.  Colonic diverticulosis. Multilevel compression deformities along the spine but the margins are somewhat sclerotic. Favor more of a chronic process. Please correlate with the history. Extensive vascular calcifications including the aorta and branch vessels. Please correlate with any particular symptoms. Electronically Signed   By: Karen Kays M.D.   On: 05/24/2022 15:34   CT Head Wo Contrast  Result Date: 05/24/2022 CLINICAL DATA:  Altered mental status EXAM: CT HEAD WITHOUT CONTRAST TECHNIQUE: Contiguous axial images were obtained from the base of the skull through the vertex without intravenous contrast. RADIATION DOSE REDUCTION: This exam was performed according to the departmental dose-optimization program which includes automated exposure control, adjustment of the mA and/or kV according to patient size and/or use of iterative reconstruction technique. COMPARISON:  Paranasal sinus CT 11/30/2017 and CT head 03/28/2005 FINDINGS: Brain: No intracranial hemorrhage, mass effect, or evidence of acute infarct. No hydrocephalus. No extra-axial fluid collection. Generalized cerebral atrophy. Ill-defined hypoattenuation within the cerebral white matter is nonspecific but consistent with chronic  small vessel ischemic disease. Physiologic basal ganglia calcification. Vascular: No hyperdense vessel. Intracranial arterial calcification. Skull: No fracture or focal lesion. Sinuses/Orbits: No acute finding. Other: None. IMPRESSION: 1. No evidence of acute intracranial abnormality. 2. Chronic small vessel ischemic disease and cerebral atrophy. Electronically Signed   By: Minerva Fester M.D.   On: 05/24/2022 15:25   DG Chest Portable 1 View  Result Date: 05/24/2022 CLINICAL DATA:  Altered mental status.  Sepsis workup. EXAM: PORTABLE CHEST 1 VIEW COMPARISON:  CT chest 04/20/2022 FINDINGS: The cardiomediastinal silhouette is stable. Bandlike opacity in the left perihilar region with associated architectural distortion is similar to the prior study. The lingular nodule seen on the recent PET-CT is not well appreciated on the current study. There is no new or worsening focal airspace disease. There is no pulmonary edema. There is no pleural effusion or pneumothorax There is no acute osseous abnormality. IMPRESSION: No radiographic evidence of acute cardiopulmonary process. Electronically Signed   By: Lesia Hausen M.D.   On: 05/24/2022 14:15    EKG: Independently reviewed.  Sinus tachycardia at 126 bpm.  QTc prolonged at 533.  Nonspecific T wave changes.  Assessment/Plan Principal Problem:   Sepsis secondary to UTI [facility]) Active Problems:   Essential hypertension   COPD GOLD III/ 02 dep    Adenocarcinoma of left lung, stage 1 (HCC)   OSA on CPAP   CAD (coronary artery disease)   Macrocytic anemia  Sepsis secondary to UTI > Patient meeting sepsis criteria with tachycardia, tachypnea, leukocytosis.  Presumed UTI as source given evidence of pyelonephritis on CT of the abdomen pelvis while awaiting urinalysis results. > Received vancomycin, cefepime, Flagyl in the  ED. > Will monitor in progressive given mild increased work of breathing with some wheezing and her chronic respiratory failure. -  Monitor on progressive - Narrow antibiotics to ceftriaxone - Follow-up urinalysis - Add on urine culture - Follow-up blood cultures - Trend fever curve and WBC - Procalcitonin and trend  OSA - CPAP qhs  Hypertension - Continue home amlodipine and metoprolol - Holding home hydrochlorothiazide and losartan with developing sepsis and evidence of dehydration  COPD Chronic respiratory failure with hypoxia > Remains on home 4 to 6 L supplemental oxygen. - Replace home Trelegy with formulary Breo and Incruse - As needed albuterol - Continue Daily Prednisone  CAD - Continue home aspirin, Plavix, atorvastatin - Continue home metoprolol - Holding home losartan as above  QT prolongation > QTc 553 on EKG - Trend EKG - Avoid QT prolonging medications  Lung cancer > Followed for adenocarcinoma by oncology. > At last visit on 4/4 patient was noted to be status post radiation and observation at that time. > Notably had recent CT scan concerning for recurrence and PET scan for follow-up was done 4 days ago which again showed changes suspicious for recurrence.  DVT prophylaxis: Lovenox Code Status:   Full Family Communication:  Attempted to update family by phone, but there was no answer. Disposition Plan:   Patient is from:  Home  Anticipated DC to:  Home  Anticipated DC date:  1 to 4 days  Anticipated DC barriers: None  Consults called:  None Admission status:  Observation, telemetry  Severity of Illness: The appropriate patient status for this patient is OBSERVATION. Observation status is judged to be reasonable and necessary in order to provide the required intensity of service to ensure the patient's safety. The patient's presenting symptoms, physical exam findings, and initial radiographic and laboratory data in the context of their medical condition is felt to place them at decreased risk for further clinical deterioration. Furthermore, it is anticipated that the patient will be  medically stable for discharge from the hospital within 2 midnights of admission.    Synetta Fail MD [facility]  How to contact the [facility] Attending or Consulting provider 7A - 7P or covering provider during after hours 7P -7A, for this patient?   Check the care team in [facility] and look for a) attending/consulting [facility] provider listed and b) the [facility] team listed Log into [email or web address] and use [facility] universal password to access. If you do not have the password, please contact the hospital operator. Locate the [facility] provider you are looking for under [facility] and page to a number that you can be directly reached. If you still have difficulty reaching the provider, please page the [facility] (Director on Call) for the Hospitalists listed on amion for assistance.  05/24/2022, 4:33 PM

## 2022-05-24 NOTE — ED Provider Notes (Signed)
I discussed care with the admitting team, they will come to admit the patient to the hospital.   Eber Hong, MD 05/24/22 [phone]

## 2022-05-24 NOTE — ED Provider Notes (Signed)
[place] EMERGENCY DEPARTMENT AT [facility] Provider Note   CSN: [id number] Arrival date & time: 05/24/22  1319     History  Chief Complaint  Patient presents with   Altered Mental Status    Suzanne Dickson is a 77 y.o. female.  With PMH of COPD on 4 to 6 L oxygen at baseline, HTN, HLD, lung adenocarcinoma who was brought in from home with husband for abdominal cramping and pain with dysuria and vomiting over the past 2 days with decreased p.o. intake.  Patient says she started feeling unwell yesterday.  Has been having abdominal cramping with pain with urination.  She has had associated nausea with nonbloody nonbilious emesis.  Has been having normal bowel movements no diarrhea.  No chest pain or work shortness of breath.  No increased cough or productive cough from baseline.  Has felt chills at home but no documented fever.   Altered Mental Status      Home Medications Prior to Admission medications   Medication Sig Start Date End Date Taking? Authorizing Provider  acetaminophen (TYLENOL) 500 MG tablet Take 1 tablet (500 mg total) by mouth every 6 (six) hours as needed. 03/22/21   Derwood Kaplan, MD  albuterol (PROVENTIL) (2.5 MG/3ML) 0.083% nebulizer solution Use every 4 hours and as needed for shortness of breath. 09/25/21   Nyoka Cowden, MD  albuterol (VENTOLIN HFA) 108 (90 Base) MCG/ACT inhaler INHALE 2 PUFFS BY MOUTH EVERY 4 HOURS AS NEEDED FOR WHEEZING FOR SHORTNESS OF BREATH 05/21/22   Nyoka Cowden, MD  amLODipine (NORVASC) 5 MG tablet Take 5 mg by mouth daily.    [provider]  aspirin EC 81 MG tablet Take 81 mg by mouth daily. Swallow whole.    [provider]  atorvastatin (LIPITOR) 20 MG tablet Take 20 mg by mouth at bedtime. 08/21/20   [provider]  Azelastine-Fluticasone 137-50 MCG/ACT SUSP 2 PUFFS INTO EACH NOSTRIL AT BEDTIME Patient not taking: Reported on 11/26/2021 04/22/20   Nyoka Cowden, MD  azithromycin  (ZITHROMAX) 250 MG tablet Take 1 tablet (250 mg total) by mouth daily. 01/07/22   Nyoka Cowden, MD  benzonatate (TESSALON) 200 MG capsule Take 1 capsule (200 mg total) by mouth 3 (three) times daily as needed for cough. Patient not taking: Reported on 11/26/2021 09/22/20   Osvaldo Shipper, MD  cetirizine (ZYRTEC) 10 MG tablet Take 10 mg by mouth at bedtime.    [provider]  cholecalciferol (VITAMIN D3) 25 MCG (1000 UNIT) tablet Take 1,000 Units by mouth daily.    [provider]  clopidogrel (PLAVIX) 75 MG tablet Take 75 mg by mouth every morning.     [provider]  Cyanocobalamin (B-12 PO) Take 1 tablet by mouth daily.    [provider]  famotidine (PEPCID) 20 MG tablet One after supper 02/12/19   Nyoka Cowden, MD  ferrous sulfate 325 (65 FE) MG EC tablet Take 325 mg by mouth 3 (three) times daily with meals.     [provider]  Fluticasone-Umeclidin-Vilant (TRELEGY ELLIPTA) 100-62.5-25 MCG/ACT AEPB INHALE 1 PUFF ONCE DAILY 03/02/22   Nyoka Cowden, MD  hydrochlorothiazide (MICROZIDE) 12.5 MG capsule Take 12.5 mg by mouth daily. Patient not taking: Reported on 11/26/2021    [provider]  ibuprofen (ADVIL) 200 MG tablet Take 400 mg by mouth every 6 (six) hours as needed (pain). Patient not taking: Reported on 11/26/2021    [provider]  Lidocaine 4 % PTCH Apply 1 patch topically 2 (two) times daily. Patient not taking: Reported on 11/26/2021 03/22/21   Derwood Kaplan, MD  losartan (COZAAR) 50 MG tablet Take 50 mg by mouth every morning. 11/15/18   [provider]  methocarbamol (ROBAXIN) 500 MG tablet Take by mouth. 05/14/21   [provider]  metoprolol succinate (TOPROL-XL) 25 MG 24 hr tablet Take 25 mg by mouth daily.    [provider]  naproxen (NAPROSYN) 250 MG tablet Take 1 tablet (250 mg total) by mouth 2 (two) times daily with a meal. 03/22/21   Derwood Kaplan, MD  nitroGLYCERIN  (NITRODUR - DOSED IN MG/24 HR) 0.4 mg/hr patch Place 0.4 mg onto the skin every morning. 08/10/17   [provider]  nitroGLYCERIN (NITROSTAT) 0.4 MG SL tablet Place 0.4 mg under the tongue every 5 (five) minutes as needed for chest pain.    [provider]  OVER THE COUNTER MEDICATION Take 1 tablet by mouth at bedtime. OTC sleep aid - unknown ingredients    [provider]  OVER THE COUNTER MEDICATION Take 3,000 mcg by mouth daily. Vitamin B12 tablet daily    [provider]  OXYGEN Inhale 4-6 L into the lungs continuous.    [provider]  pantoprazole (PROTONIX) 40 MG tablet Take 30-60 min before first meal of the day Patient taking differently: Take 40 mg by mouth daily before breakfast. 08/08/18   Nyoka Cowden, MD  Polyvinyl Alcohol-Povidone (REFRESH OP) Place 1 drop into both eyes daily as needed (dry eyes).    [provider]  predniSONE (DELTASONE) 10 MG tablet TAKE 2 TABLETS BY MOUTH ONCE DAILY UNTIL  FEELING  BETTER  THEN  TAKE  1  TABLET  DAILY 04/26/22   Nyoka Cowden, MD  sertraline (ZOLOFT) 100 MG tablet Take 100 mg by mouth daily.    [provider]  tiZANidine (ZANAFLEX) 4 MG tablet Take 1 tablet (4 mg total) by mouth every 8 (eight) hours as needed for muscle spasms. Patient not taking: Reported on 11/26/2021 03/22/21   Derwood Kaplan, MD      Allergies    Patient has no known allergies.    Review of Systems   Review of Systems  Physical Exam Updated Vital Signs BP (!) 144/74   Pulse (!) 129   Temp 98.3 F (36.8 C) (Oral)   Resp (!) 25   Ht 5\' 2"  (1.575 m)   Wt 74 kg   SpO2 95%   BMI 29.84 kg/m  Physical Exam Constitutional: Alert and orientedx4, chronically ill appearing. Eyes: Conjunctivae are normal. ENT      Head: Normocephalic and atraumatic. Cardiovascular: tachycardic, regular rhythm Respiratory: mildly tachypneic, decreased BS, O2 sat 100 on  Gastrointestinal: Soft and  nondistended with LUQ and suprapubic ttp, no rebound or guarding, not peritonitic Musculoskeletal: trace equal nontender pitting edema of LEs Neurologic: Normal speech and language. AAOx4. GCS 15, no facial droop Skin: Skin is warm, dry Psychiatric: Mood and affect are normal. Speech and behavior are normal.  ED Results / Procedures / Treatments   Labs (all labs ordered are listed, but only abnormal results are displayed) Labs Reviewed  COMPREHENSIVE METABOLIC PANEL - Abnormal; Notable for the following components:      Result Value   Chloride 96 (*)    Glucose, Bld 153 (*)    Creatinine, Ser 1.47 (*)    Albumin 3.4 (*)    GFR, Estimated 37 (*)  All other components within normal limits  CBC WITH DIFFERENTIAL/PLATELET - Abnormal; Notable for the following components:   WBC 16.0 (*)    RBC 3.52 (*)    Hemoglobin 10.8 (*)    MCV 104.0 (*)    MCHC 29.5 (*)    Platelets 117 (*)    Neutro Abs 14.1 (*)    Abs Immature Granulocytes 0.15 (*)    All other components within normal limits  URINALYSIS, ROUTINE W REFLEX MICROSCOPIC - Abnormal; Notable for the following components:   Color, Urine STRAW (*)    Glucose, UA >=500 (*)    Hgb urine dipstick SMALL (*)    Nitrite POSITIVE (*)    Leukocytes,Ua SMALL (*)    Bacteria, UA FEW (*)    All other components within normal limits  LACTIC ACID, PLASMA - Abnormal; Notable for the following components:   Lactic Acid, Venous 2.0 (*)    All other components within normal limits  LACTIC ACID, PLASMA - Abnormal; Notable for the following components:   Lactic Acid, Venous 2.6 (*)    All other components within normal limits  TROPONIN I (HIGH SENSITIVITY) - Abnormal; Notable for the following components:   Troponin I (High Sensitivity) 44 (*)    All other components within normal limits  TROPONIN I (HIGH SENSITIVITY) - Abnormal; Notable for the following components:   Troponin I (High Sensitivity) 39 (*)    All other components within  normal limits  SARS CORONAVIRUS 2 BY RT PCR  CULTURE, BLOOD (ROUTINE X 2)  CULTURE, BLOOD (ROUTINE X 2)  ETHANOL  BRAIN NATRIURETIC PEPTIDE  LIPASE, BLOOD  PROCALCITONIN  PROTIME-INR  CORTISOL-AM, BLOOD  PROCALCITONIN  CBC  COMPREHENSIVE METABOLIC PANEL    EKG None  Radiology CT ABDOMEN PELVIS W CONTRAST  Result Date: 05/24/2022 CLINICAL DATA:  Status post XRT left-sided lung cancer. Right lower quadrant pain, vomiting and sepsis. EXAM: CT ABDOMEN AND PELVIS WITH CONTRAST TECHNIQUE: Multidetector CT imaging of the abdomen and pelvis was performed using the standard protocol following bolus administration of intravenous contrast. RADIATION DOSE REDUCTION: This exam was performed according to the departmental dose-optimization program which includes automated exposure control, adjustment of the mA and/or kV according to patient size and/or use of iterative reconstruction technique. CONTRAST:  60mL OMNIPAQUE IOHEXOL 350 MG/ML SOLN COMPARISON:  PET-CT 05/20/2022 FINDINGS: Lower chest: Breathing motion at the lung bases. Mild scar or atelectasis. No pleural effusion. Coronary artery calcifications are seen. Coronary artery calcifications are seen. Small hiatal hernia. Hepatobiliary: Fatty liver infiltration. No obvious space-occupying liver lesion. Patent portal vein. Gallbladder is surgically absent Pancreas: Unremarkable. No pancreatic ductal dilatation or surrounding inflammatory changes. Spleen: Normal in size without focal abnormality. Adrenals/Urinary Tract: Adrenal glands are preserved. Multiple bilateral renal cysts are identified. Largest is seen upper pole on the right measuring 5.8 cm in diameter and Hounsfield unit of 10. Lower pole focus on the left as another example measures diameter 3.8 cm and has Hounsfield units of 7. Few other smaller foci identified. These are Bosniak 1 and 2 lesions. No specific imaging follow-up. However there is an ill-defined area in the anterior aspect of the  left kidney best seen on delayed series 13, image 14 measuring 19 mm. Hounsfield units of 105 on delayed and 99 on portal venous phase. Margins are somewhat difficult to define as there is motion throughout the examination. There is some perinephric stranding. Based on appearance this could represent edema of the parenchyma and an area of pyelonephritis with the  stranding. Please correlate with clinical presentation. Preserved contours of the urinary bladder. Stomach/Bowel: Sigmoid colon diverticula. Scattered colonic stool. Normal appendix. Stomach and small bowel are nondilated. Vascular/Lymphatic: Extensive vascular calcifications identified along the aorta and branch vessels with areas of potential stenosis including the iliac vessels, SMA and renal arteries. Please correlate with the history. No specific abnormal lymph node enlargement present in the abdomen and pelvis. Reproductive: Uterus and bilateral adnexa are unremarkable. Other: No abdominal wall hernia or abnormality. No abdominopelvic ascites. Musculoskeletal: Trace anterolisthesis of L3 on L4. Multilevel compression deformities along the spine with the margins are sclerotic. Favor a more chronic process. IMPRESSION: Study limited by motion throughout the examination. There is some ill-defined area enhancement along the left mid kidney with stranding. Possibilities would include an area of pyelonephritis. Please correlate with history. Otherwise would recommend follow up evaluation when the patient is more able to tolerate the study without motion. Separate bilateral benign-appearing renal cysts. No bowel obstruction.  Colonic diverticulosis. Multilevel compression deformities along the spine but the margins are somewhat sclerotic. Favor more of a chronic process. Please correlate with the history. Extensive vascular calcifications including the aorta and branch vessels. Please correlate with any particular symptoms. Electronically Signed   By: Karen Kays M.D.   On: 05/24/2022 15:34   CT Head Wo Contrast  Result Date: 05/24/2022 CLINICAL DATA:  Altered mental status EXAM: CT HEAD WITHOUT CONTRAST TECHNIQUE: Contiguous axial images were obtained from the base of the skull through the vertex without intravenous contrast. RADIATION DOSE REDUCTION: This exam was performed according to the departmental dose-optimization program which includes automated exposure control, adjustment of the mA and/or kV according to patient size and/or use of iterative reconstruction technique. COMPARISON:  Paranasal sinus CT 11/30/2017 and CT head 03/28/2005 FINDINGS: Brain: No intracranial hemorrhage, mass effect, or evidence of acute infarct. No hydrocephalus. No extra-axial fluid collection. Generalized cerebral atrophy. Ill-defined hypoattenuation within the cerebral white matter is nonspecific but consistent with chronic small vessel ischemic disease. Physiologic basal ganglia calcification. Vascular: No hyperdense vessel. Intracranial arterial calcification. Skull: No fracture or focal lesion. Sinuses/Orbits: No acute finding. Other: None. IMPRESSION: 1. No evidence of acute intracranial abnormality. 2. Chronic small vessel ischemic disease and cerebral atrophy. Electronically Signed   By: Minerva Fester M.D.   On: 05/24/2022 15:25   DG Chest Portable 1 View  Result Date: 05/24/2022 CLINICAL DATA:  Altered mental status.  Sepsis workup. EXAM: PORTABLE CHEST 1 VIEW COMPARISON:  CT chest 04/20/2022 FINDINGS: The cardiomediastinal silhouette is stable. Bandlike opacity in the left perihilar region with associated architectural distortion is similar to the prior study. The lingular nodule seen on the recent PET-CT is not well appreciated on the current study. There is no new or worsening focal airspace disease. There is no pulmonary edema. There is no pleural effusion or pneumothorax There is no acute osseous abnormality. IMPRESSION: No radiographic evidence of acute  cardiopulmonary process. Electronically Signed   By: Lesia Hausen M.D.   On: 05/24/2022 14:15    Procedures .Critical Care  Performed by: Mardene Sayer, MD Authorized by: Mardene Sayer, MD   Critical care provider statement:    Critical care time (minutes):  35   Critical care was necessary to treat or prevent imminent or life-threatening deterioration of the following conditions:  Sepsis   Critical care was time spent personally by me on the following activities:  Development of treatment plan with patient or surrogate, evaluation of patient's response to treatment, examination  of patient, ordering and review of laboratory studies, ordering and review of radiographic studies, ordering and performing treatments and interventions, pulse oximetry, re-evaluation of patient's condition, review of old charts and obtaining history from patient or surrogate   Care discussed with: admitting provider       Medications Ordered in ED Medications  vancomycin (VANCOREADY) IVPB 1500 mg/300 mL (1,500 mg Intravenous New Bag/Given 05/24/22 1629)  aspirin EC tablet 81 mg (has no administration in time range)  amLODipine (NORVASC) tablet 5 mg (has no administration in time range)  atorvastatin (LIPITOR) tablet 20 mg (has no administration in time range)  metoprolol succinate (TOPROL-XL) 24 hr tablet 25 mg (has no administration in time range)  pantoprazole (PROTONIX) EC tablet 40 mg (has no administration in time range)  clopidogrel (PLAVIX) tablet 75 mg (has no administration in time range)  fluticasone furoate-vilanterol (BREO ELLIPTA) 100-25 MCG/ACT 1 puff (has no administration in time range)    And  umeclidinium bromide (INCRUSE ELLIPTA) 62.5 MCG/ACT 1 puff (has no administration in time range)  albuterol (PROVENTIL) (2.5 MG/3ML) 0.083% nebulizer solution 2.5 mg (has no administration in time range)  enoxaparin (LOVENOX) injection 30 mg (has no administration in time range)  sodium chloride  flush (NS) 0.9 % injection 3 mL (has no administration in time range)  lactated ringers infusion ( Intravenous New Bag/Given 05/24/22 1707)  cefTRIAXone (ROCEPHIN) 2 g in sodium chloride 0.9 % 100 mL IVPB (has no administration in time range)  acetaminophen (TYLENOL) tablet 650 mg (650 mg Oral Given 05/24/22 1803)    Or  acetaminophen (TYLENOL) suppository 650 mg ( Rectal See Alternative 05/24/22 1803)  polyethylene glycol (MIRALAX / GLYCOLAX) packet 17 g (has no administration in time range)  predniSONE (DELTASONE) tablet 10 mg (has no administration in time range)  methocarbamol (ROBAXIN) tablet 500 mg (has no administration in time range)  lactated ringers bolus 1,000 mL (0 mLs Intravenous Stopped 05/24/22 1517)  ceFEPIme (MAXIPIME) 2 g in sodium chloride 0.9 % 100 mL IVPB (0 g Intravenous Stopped 05/24/22 1520)  metroNIDAZOLE (FLAGYL) IVPB 500 mg (0 mg Intravenous Stopped 05/24/22 1625)  acetaminophen (TYLENOL) tablet 1,000 mg (1,000 mg Oral Given 05/24/22 1432)  morphine (PF) 4 MG/ML injection 4 mg (4 mg Intravenous Given 05/24/22 1430)  ondansetron (ZOFRAN) injection 4 mg (4 mg Intravenous Given 05/24/22 1430)  ipratropium-albuterol (DUONEB) 0.5-2.5 (3) MG/3ML nebulizer solution 3 mL (3 mLs Nebulization Given 05/24/22 1437)  lactated ringers bolus 1,000 mL (0 mLs Intravenous Stopped 05/24/22 1706)  iohexol (OMNIPAQUE) 350 MG/ML injection 60 mL (60 mLs Intravenous Contrast Given 05/24/22 1520)    ED Course/ Medical Decision Making/ A&P                             Medical Decision Making  Suzanne Dickson is a 77 y.o. female.  With PMH of COPD on 4 to 6 L oxygen at baseline, HTN, HLD, lung adenocarcinoma who was brought in from home with husband for abdominal cramping and pain with dysuria and vomiting over the past 2 days with decreased p.o. intake.    Patient presented tachypneic, tachycardic to the 120s with stable blood pressure and borderline temperature with 37.7 C.  Her presentation  concerning for SIRS/possible sepsis secondary to presumed UTI or pyelonephritis especially with urinary symptoms or possible intra-abdominal pathology.  Started patient on IV fluids and broad-spectrum antibiotics.  On baseline oxygen requirement.  Chest x-ray reviewed by me no evidence  of pneumonia.  CT head unremarkable, obtained for reported altered mental status however on exam at baseline mental status and interacting appropriately.   Labs reviewed notable for leukocytosis 16 with left shift concerning for infection as discussed.  Macrocytic anemia hemoglobin 10.8.  Creatinine 1.47 slightly increased from baseline. Trop 44 likely demand from sepsis this has downtrended from priors.   CTAP with contrast obtained suggestive of left-sided pyelonephritis consistent with patient's symptoms.  Plan to admit for continued management of urosepsis.   Amount and/or Complexity of Data Reviewed Labs: ordered. Radiology: ordered.  Risk OTC drugs. Prescription drug management. Decision regarding hospitalization.     Final Clinical Impression(s) / ED Diagnoses Final diagnoses:  Sepsis, due to unspecified organism, unspecified whether acute organ dysfunction present [facility])    Rx / DC Orders ED Discharge Orders     None         Mardene Sayer, MD 05/24/22 1805

## 2022-05-24 NOTE — ED Notes (Signed)
Suzanne Dickson in phlebotomy to obtain second set of cultures, second troponin and second lactic.

## 2022-05-24 NOTE — ED Notes (Signed)
ED TO INPATIENT HANDOFF REPORT  ED Nurse Name and Phone #: Starlyn Skeans RN [phone]  S Name/Age/Gender Suzanne Dickson 77 y.o. female Room/Bed: 017C/017C  Code Status   Code Status: Full Code  Home/SNF/Other Home Patient oriented to: self, place, time, and situation Is this baseline? Yes   Triage Complete: Triage complete  Chief Complaint Sepsis secondary to UTI (HCC) [A41.9, N39.0]  Triage Note Pt BIB [facility] from home due to altered mental status and no intake for the past two days.Baseline 4-6 oxygen nasal cannula.   VS BP 176/155, HR 129, Spo2 98%, Resp 42, CBG160.  Temp 99.7.   Allergies No Known Allergies  Level of Care/Admitting Diagnosis ED Disposition     ED Disposition  Admit   Condition  --   Comment  Hospital Area: [facility] [[id number]]  Level of Care: Telemetry Medical [[id number]]  May place patient in observation at [facility] or [facility] if equivalent level of care is available:: No  Covid Evaluation: Asymptomatic - no recent exposure (last 10 days) testing not required  Diagnosis: Sepsis secondary to UTI [facility]) [[id number]]  Admitting Physician: Synetta Fail [[id number]]  Attending Physician: Synetta Fail [[id number]]          B Medical/Surgery History Past Medical History:  Diagnosis Date   Adenocarcinoma of left lung, 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   "I'm in remission" (04/05/2017)   Adenocarcinoma, lung (HCC)    Anxiety    Arthritis    "legs, elbows" (04/05/2017)   Bronchitis, chronic (HCC)    "haven't had it in awhile" (04/05/2017)   CHF (congestive heart failure) (HCC)    COPD (chronic obstructive pulmonary disease) (HCC)    Coronary artery disease    Daily headache    "recently" (04/05/2017)   Depression    Dyspnea    with exertion   GERD (gastroesophageal reflux disease)    History of blood transfusion 1950s   History of radiation therapy 05/04/16 - 05/12/16   Left lung treated to 54 Gy with 3 fx of 18 Gy    Hypertension    On home oxygen therapy    "2L; 24/7" (04/05/2017)   OSA (obstructive sleep apnea)    OSA on CPAP    Pneumonia 10/2015   "at least 3 times" (04/05/2017)   Pulmonary hypertension (HCC)    Past Surgical History:  Procedure Laterality Date   COLONOSCOPY     CORONARY ANGIOPLASTY WITH STENT PLACEMENT  04/05/2017   CORONARY STENT INTERVENTION N/A 04/05/2017   Procedure: CORONARY STENT INTERVENTION;  Surgeon: Rinaldo Cloud, MD;  Location: [facility] INVASIVE CV LAB;  Service: Cardiovascular;  Laterality: N/A;   DILATION AND CURETTAGE OF UTERUS     FUDUCIAL PLACEMENT Left 03/31/2016   Procedure: PLACEMENT OF FUDUCIAL LEFT UPPER LOBE;  Surgeon: Leslye Peer, MD;  Location: [facility] OR;  Service: Thoracic;  Laterality: Left;   LAPAROSCOPIC CHOLECYSTECTOMY     LEFT HEART CATH AND CORONARY ANGIOGRAPHY N/A 02/17/2017   Procedure: LEFT HEART CATH AND CORONARY ANGIOGRAPHY;  Surgeon: Rinaldo Cloud, MD;  Location: [facility] INVASIVE CV LAB;  Service: Cardiovascular;  Laterality: N/A;   SINUSOTOMY     TONSILLECTOMY     TUBAL LIGATION     VIDEO BRONCHOSCOPY WITH ENDOBRONCHIAL NAVIGATION N/A 03/31/2016   Procedure: VIDEO BRONCHOSCOPY WITH ENDOBRONCHIAL NAVIGATION;  Surgeon: Leslye Peer, MD;  Location: [facility] OR;  Service: Thoracic;  Laterality: N/A;     A IV Location/Drains/Wounds Patient Lines/Drains/Airways Status  Active Line/Drains/Airways     Name Placement date Placement time Site Days   Peripheral IV 05/24/22 20 G Anterior;Right Forearm 05/24/22  1336  Forearm  less than 1            Intake/Output Last 24 hours No intake or output data in the 24 hours ending 05/24/22 1632  Labs/Imaging Results for orders placed or performed during the hospital encounter of 05/24/22 (from the past 48 hour(s))  Comprehensive metabolic panel     Status: Abnormal   Collection Time: 05/24/22  1:40 PM  Result Value Ref Range   Sodium 136 135 - 145 mmol/L   Potassium 3.7 3.5 - 5.1 mmol/L   Chloride 96 (L) 98  - 111 mmol/L   CO2 26 22 - 32 mmol/L   Glucose, Bld 153 (H) 70 - 99 mg/dL    Comment: Glucose reference range applies only to samples taken after fasting for at least 8 hours.   BUN 23 8 - 23 mg/dL   Creatinine, Ser 5.36 (H) 0.44 - 1.00 mg/dL   Calcium 8.9 8.9 - 64.4 mg/dL   Total Protein 6.6 6.5 - 8.1 g/dL   Albumin 3.4 (L) 3.5 - 5.0 g/dL   AST 39 15 - 41 U/L   ALT 36 0 - 44 U/L   Alkaline Phosphatase 64 38 - 126 U/L   Total Bilirubin 0.8 0.3 - 1.2 mg/dL   GFR, Estimated 37 (L) >60 mL/min    Comment: (NOTE) Calculated using the CKD-EPI Creatinine Equation (2021)    Anion gap 14 5 - 15    Comment: Performed at [facility], [address] [place], [place] [address]  Ethanol     Status: None   Collection Time: 05/24/22  1:40 PM  Result Value Ref Range   Alcohol, Ethyl (B) <10 <10 mg/dL    Comment: (NOTE) Lowest detectable limit for serum alcohol is 10 mg/dL.  For medical purposes only. Performed at [facility], [address], [place], [place] [address]   CBC with Differential     Status: Abnormal   Collection Time: 05/24/22  1:40 PM  Result Value Ref Range   WBC 16.0 (H) 4.0 - 10.5 K/uL   RBC 3.52 (L) 3.87 - 5.11 MIL/uL   Hemoglobin 10.8 (L) 12.0 - 15.0 g/dL   HCT 38.7 56.4 - 33.2 %   MCV 104.0 (H) 80.0 - 100.0 fL   MCH 30.7 26.0 - 34.0 pg   MCHC 29.5 (L) 30.0 - 36.0 g/dL   RDW 95.1 88.4 - 16.6 %   Platelets 117 (L) 150 - 400 K/uL   nRBC 0.1 0.0 - 0.2 %   Neutrophils Relative % 89 %   Neutro Abs 14.1 (H) 1.7 - 7.7 K/uL   Lymphocytes Relative 4 %   Lymphs Abs 0.7 0.7 - 4.0 K/uL   Monocytes Relative 6 %   Monocytes Absolute 1.0 0.1 - 1.0 K/uL   Eosinophils Relative 0 %   Eosinophils Absolute 0.0 0.0 - 0.5 K/uL   Basophils Relative 0 %   Basophils Absolute 0.0 0.0 - 0.1 K/uL   Immature Granulocytes 1 %   Abs Immature Granulocytes 0.15 (H) 0.00 - 0.07 K/uL    Comment: Performed at [facility], [address], [place], [place] [address]   Troponin I (High Sensitivity)     Status: Abnormal   Collection Time: 05/24/22  1:40 PM  Result Value Ref Range   Troponin I (High Sensitivity)  44 (H) <18 ng/L    Comment: (NOTE) Elevated high sensitivity troponin I (hsTnI) values and significant  changes across serial measurements may suggest ACS but many other  chronic and acute conditions are known to elevate hsTnI results.  Refer to the "Links" section for chest pain algorithms and additional  guidance. Performed at [facility], [address], [place], [place] [address]   Lactic acid, plasma     Status: Abnormal   Collection Time: 05/24/22  1:40 PM  Result Value Ref Range   Lactic Acid, Venous 2.0 (HH) 0.5 - 1.9 mmol/L    Comment: CRITICAL RESULT CALLED TO, READ BACK BY AND VERIFIED WITH GONZOLAS,R RN @ 1459 05/24/22 LEONARD,A Performed at [facility], [address], [place], [place] [address]   Brain natriuretic peptide     Status: None   Collection Time: 05/24/22  1:40 PM  Result Value Ref Range   B Natriuretic Peptide 62.2 0.0 - 100.0 pg/mL    Comment: Performed at [facility], [address] [place], [place] [address]  Lipase, blood     Status: None   Collection Time: 05/24/22  1:40 PM  Result Value Ref Range   Lipase 29 11 - 51 U/L    Comment: Performed at [facility], [address], [place], [place] [address]  SARS Coronavirus 2 by RT PCR (hospital order, performed in [facility] hospital lab) *cepheid single result test*     Status: None   Collection Time: 05/24/22  1:46 PM   Specimen: Nasal Swab  Result Value Ref Range   SARS Coronavirus 2 by RT PCR NEGATIVE NEGATIVE    Comment: Performed at [facility], [address] [place], [place] [address]  Urinalysis, Routine w reflex microscopic -Urine, Clean Catch     Status: Abnormal   Collection Time: 05/24/22  3:31 PM  Result Value Ref Range   Color, Urine STRAW (A) YELLOW   APPearance CLEAR CLEAR   Specific Gravity, Urine 1.013  1.005 - 1.030   pH 7.0 5.0 - 8.0   Glucose, UA >=500 (A) NEGATIVE mg/dL   Hgb urine dipstick SMALL (A) NEGATIVE   Bilirubin Urine NEGATIVE NEGATIVE   Ketones, ur NEGATIVE NEGATIVE mg/dL   Protein, ur NEGATIVE NEGATIVE mg/dL   Nitrite POSITIVE (A) NEGATIVE   Leukocytes,Ua SMALL (A) NEGATIVE   RBC / HPF 0-5 0 - 5 RBC/hpf   WBC, UA 11-20 0 - 5 WBC/hpf   Bacteria, UA FEW (A) NONE SEEN   Squamous Epithelial / HPF 0-5 0 - 5 /HPF    Comment: Performed at [facility], [address], [place], [place] [address]   CT ABDOMEN PELVIS W CONTRAST  Result Date: 05/24/2022 CLINICAL DATA:  Status post XRT left-sided lung cancer. Right lower quadrant pain, vomiting and sepsis. EXAM: CT ABDOMEN AND PELVIS WITH CONTRAST TECHNIQUE: Multidetector CT imaging of the abdomen and pelvis was performed using the standard protocol following bolus administration of intravenous contrast. RADIATION DOSE REDUCTION: This exam was performed according to the departmental dose-optimization program which includes automated exposure control, adjustment of the mA and/or kV according to patient size and/or use of iterative reconstruction technique. CONTRAST:  60mL OMNIPAQUE IOHEXOL 350 MG/ML SOLN COMPARISON:  PET-CT 05/20/2022 FINDINGS: Lower chest: Breathing motion at the lung bases. Mild scar or atelectasis. No pleural effusion. Coronary artery calcifications are seen. Coronary artery calcifications are seen. Small hiatal hernia. Hepatobiliary: Fatty liver infiltration. No obvious space-occupying  liver lesion. Patent portal vein. Gallbladder is surgically absent Pancreas: Unremarkable. No pancreatic ductal dilatation or surrounding inflammatory changes. Spleen: Normal in size without focal abnormality. Adrenals/Urinary Tract: Adrenal glands are preserved. Multiple bilateral renal cysts are identified. Largest is seen upper pole on the right measuring 5.8 cm in diameter and Hounsfield unit of 10. Lower pole focus on the left as  another example measures diameter 3.8 cm and has Hounsfield units of 7. Few other smaller foci identified. These are Bosniak 1 and 2 lesions. No specific imaging follow-up. However there is an ill-defined area in the anterior aspect of the left kidney best seen on delayed series 13, image 14 measuring 19 mm. Hounsfield units of 105 on delayed and 99 on portal venous phase. Margins are somewhat difficult to define as there is motion throughout the examination. There is some perinephric stranding. Based on appearance this could represent edema of the parenchyma and an area of pyelonephritis with the stranding. Please correlate with clinical presentation. Preserved contours of the urinary bladder. Stomach/Bowel: Sigmoid colon diverticula. Scattered colonic stool. Normal appendix. Stomach and small bowel are nondilated. Vascular/Lymphatic: Extensive vascular calcifications identified along the aorta and branch vessels with areas of potential stenosis including the iliac vessels, SMA and renal arteries. Please correlate with the history. No specific abnormal lymph node enlargement present in the abdomen and pelvis. Reproductive: Uterus and bilateral adnexa are unremarkable. Other: No abdominal wall hernia or abnormality. No abdominopelvic ascites. Musculoskeletal: Trace anterolisthesis of L3 on L4. Multilevel compression deformities along the spine with the margins are sclerotic. Favor a more chronic process. IMPRESSION: Study limited by motion throughout the examination. There is some ill-defined area enhancement along the left mid kidney with stranding. Possibilities would include an area of pyelonephritis. Please correlate with history. Otherwise would recommend follow up evaluation when the patient is more able to tolerate the study without motion. Separate bilateral benign-appearing renal cysts. No bowel obstruction.  Colonic diverticulosis. Multilevel compression deformities along the spine but the margins are  somewhat sclerotic. Favor more of a chronic process. Please correlate with the history. Extensive vascular calcifications including the aorta and branch vessels. Please correlate with any particular symptoms. Electronically Signed   By: Karen Kays M.D.   On: 05/24/2022 15:34   CT Head Wo Contrast  Result Date: 05/24/2022 CLINICAL DATA:  Altered mental status EXAM: CT HEAD WITHOUT CONTRAST TECHNIQUE: Contiguous axial images were obtained from the base of the skull through the vertex without intravenous contrast. RADIATION DOSE REDUCTION: This exam was performed according to the departmental dose-optimization program which includes automated exposure control, adjustment of the mA and/or kV according to patient size and/or use of iterative reconstruction technique. COMPARISON:  Paranasal sinus CT 11/30/2017 and CT head 03/28/2005 FINDINGS: Brain: No intracranial hemorrhage, mass effect, or evidence of acute infarct. No hydrocephalus. No extra-axial fluid collection. Generalized cerebral atrophy. Ill-defined hypoattenuation within the cerebral white matter is nonspecific but consistent with chronic small vessel ischemic disease. Physiologic basal ganglia calcification. Vascular: No hyperdense vessel. Intracranial arterial calcification. Skull: No fracture or focal lesion. Sinuses/Orbits: No acute finding. Other: None. IMPRESSION: 1. No evidence of acute intracranial abnormality. 2. Chronic small vessel ischemic disease and cerebral atrophy. Electronically Signed   By: Minerva Fester M.D.   On: 05/24/2022 15:25   DG Chest Portable 1 View  Result Date: 05/24/2022 CLINICAL DATA:  Altered mental status.  Sepsis workup. EXAM: PORTABLE CHEST 1 VIEW COMPARISON:  CT chest 04/20/2022 FINDINGS: The cardiomediastinal silhouette is stable. Bandlike opacity in  the left perihilar region with associated architectural distortion is similar to the prior study. The lingular nodule seen on the recent PET-CT is not well  appreciated on the current study. There is no new or worsening focal airspace disease. There is no pulmonary edema. There is no pleural effusion or pneumothorax There is no acute osseous abnormality. IMPRESSION: No radiographic evidence of acute cardiopulmonary process. Electronically Signed   By: Lesia Hausen M.D.   On: 05/24/2022 14:15    Pending Labs Unresulted Labs (From admission, onward)     Start     Ordered   05/31/22 0500  Creatinine, serum  (enoxaparin (LOVENOX)    CrCl >/= 30 ml/min)  Weekly,   R     Comments: while on enoxaparin therapy    05/24/22 1624   05/25/22 0500  Protime-INR  Tomorrow morning,   R        05/24/22 1624   05/25/22 0500  Cortisol-am, blood  Tomorrow morning,   R        05/24/22 1624   05/25/22 0500  Procalcitonin  Tomorrow morning,   R       References:    Procalcitonin Lower Respiratory Tract Infection AND Sepsis Procalcitonin Algorithm   05/24/22 1624   05/25/22 0500  CBC  Tomorrow morning,   R        05/24/22 1624   05/25/22 0500  Comprehensive metabolic panel  Tomorrow morning,   R        05/24/22 1624   05/24/22 1623  Procalcitonin  Add-on,   AD       References:    Procalcitonin Lower Respiratory Tract Infection AND Sepsis Procalcitonin Algorithm   05/24/22 1624   05/24/22 1342  Lactic acid, plasma  Now then every 2 hours,   R (with STAT occurrences)      05/24/22 1342   05/24/22 1342  Blood culture (routine x 2)  BLOOD CULTURE X 2,   R (with STAT occurrences)      05/24/22 1342            Vitals/Pain Today's Vitals   05/24/22 1430 05/24/22 1436 05/24/22 1600 05/24/22 1610  BP: 117/72   (!) 145/71  Pulse: (!) 119  (!) 134 (!) 133  Resp: (!) 28  (!) 23 (!) 26  Temp:      TempSrc:      SpO2: 99%  96% 98%  Weight:      Height:      PainSc:  5       Isolation Precautions Airborne and Contact precautions  Medications Medications  vancomycin (VANCOREADY) IVPB 1500 mg/300 mL (1,500 mg Intravenous New Bag/Given 05/24/22 1629)   aspirin EC tablet 81 mg (has no administration in time range)  amLODipine (NORVASC) tablet 5 mg (has no administration in time range)  atorvastatin (LIPITOR) tablet 20 mg (has no administration in time range)  metoprolol succinate (TOPROL-XL) 24 hr tablet 25 mg (has no administration in time range)  pantoprazole (PROTONIX) EC tablet 40 mg (has no administration in time range)  clopidogrel (PLAVIX) tablet 75 mg (has no administration in time range)  fluticasone furoate-vilanterol (BREO ELLIPTA) 100-25 MCG/ACT 1 puff (has no administration in time range)    And  umeclidinium bromide (INCRUSE ELLIPTA) 62.5 MCG/ACT 1 puff (has no administration in time range)  albuterol (VENTOLIN HFA) 108 (90 Base) MCG/ACT inhaler 1-2 puff (has no administration in time range)  enoxaparin (LOVENOX) injection 40 mg (has no administration in time range)  sodium chloride flush (NS) 0.9 % injection 3 mL (has no administration in time range)  lactated ringers infusion (has no administration in time range)  cefTRIAXone (ROCEPHIN) 2 g in sodium chloride 0.9 % 100 mL IVPB (has no administration in time range)  acetaminophen (TYLENOL) tablet 650 mg (has no administration in time range)    Or  acetaminophen (TYLENOL) suppository 650 mg (has no administration in time range)  polyethylene glycol (MIRALAX / GLYCOLAX) packet 17 g (has no administration in time range)  lactated ringers bolus 1,000 mL (0 mLs Intravenous Stopped 05/24/22 1517)  ceFEPIme (MAXIPIME) 2 g in sodium chloride 0.9 % 100 mL IVPB (0 g Intravenous Stopped 05/24/22 1520)  metroNIDAZOLE (FLAGYL) IVPB 500 mg (0 mg Intravenous Stopped 05/24/22 1625)  acetaminophen (TYLENOL) tablet 1,000 mg (1,000 mg Oral Given 05/24/22 1432)  morphine (PF) 4 MG/ML injection 4 mg (4 mg Intravenous Given 05/24/22 1430)  ondansetron (ZOFRAN) injection 4 mg (4 mg Intravenous Given 05/24/22 1430)  ipratropium-albuterol (DUONEB) 0.5-2.5 (3) MG/3ML nebulizer solution 3 mL (3 mLs  Nebulization Given 05/24/22 1437)  lactated ringers bolus 1,000 mL (1,000 mLs Intravenous New Bag/Given 05/24/22 1533)  iohexol (OMNIPAQUE) 350 MG/ML injection 60 mL (60 mLs Intravenous Contrast Given 05/24/22 1520)    Mobility      Focused Assessments Neuro Assessment Handoff:  Swallow screen pass? Yes          Neuro Assessment:   Neuro Checks:      Has TPA been given? No If patient is a Neuro Trauma and patient is going to OR before floor call report to 4N Charge nurse: [phone] or [phone]   R Recommendations: See Admitting Provider Note  Report given to:   Additional Notes:

## 2022-05-24 NOTE — ED Notes (Signed)
ED TO INPATIENT HANDOFF REPORT  ED Nurse Name and Phone #:  [phone] Raynaldo Opitz Name/Age/Gender Suzanne Dickson 77 y.o. female Room/Bed: 036C/036C  Code Status   Code Status: Full Code  Home/SNF/Other Home Patient oriented to: self, place, time, and situation Is this baseline? Yes   Triage Complete: Triage complete  Chief Complaint Sepsis secondary to UTI (HCC) [A41.9, N39.0]  Triage Note Pt BIB [facility] from home due to altered mental status and no intake for the past two days.Baseline 4-6 oxygen nasal cannula.   VS BP 176/155, HR 129, Spo2 98%, Resp 42, CBG160.  Temp 99.7.   Allergies No Known Allergies  Level of Care/Admitting Diagnosis ED Disposition     ED Disposition  Admit   Condition  --   Comment  Hospital Area: [facility] [[id number]]  Level of Care: Progressive [102]  Admit to Progressive based on following criteria: MULTISYSTEM THREATS such as stable sepsis, metabolic/electrolyte imbalance with or without encephalopathy that is responding to early treatment.  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   "I'm in remission" (04/05/2017)   Adenocarcinoma, lung (HCC)    Anxiety    Arthritis    "legs, elbows" (04/05/2017)   Bronchitis, chronic (HCC)    "haven't had it in awhile" (04/05/2017)   CHF (congestive heart failure) (HCC)    COPD (chronic obstructive pulmonary disease) (HCC)    Coronary artery disease    Daily headache    "recently" (04/05/2017)   Depression    Dyspnea    with exertion   GERD  (gastroesophageal reflux disease)    History of blood transfusion 1950s   History of radiation therapy 05/04/16 - 05/12/16   Left lung treated to 54 Gy with 3 fx of 18 Gy   Hypertension    On home oxygen therapy    "2L; 24/7" (04/05/2017)   OSA (obstructive sleep apnea)    OSA on CPAP    Pneumonia 10/2015   "at least 3 times" (04/05/2017)   Pulmonary hypertension (HCC)    Past Surgical History:  Procedure Laterality Date   COLONOSCOPY     CORONARY ANGIOPLASTY WITH STENT PLACEMENT  04/05/2017   CORONARY STENT INTERVENTION N/A 04/05/2017   Procedure: CORONARY STENT INTERVENTION;  Surgeon: Rinaldo Cloud, MD;  Location: [facility] INVASIVE CV LAB;  Service: Cardiovascular;  Laterality: N/A;   DILATION AND CURETTAGE OF UTERUS     FUDUCIAL PLACEMENT Left 03/31/2016   Procedure: PLACEMENT OF FUDUCIAL LEFT UPPER LOBE;  Surgeon: Leslye Peer, MD;  Location: [facility] OR;  Service: Thoracic;  Laterality: Left;   LAPAROSCOPIC CHOLECYSTECTOMY     LEFT HEART CATH AND CORONARY ANGIOGRAPHY N/A 02/17/2017   Procedure: LEFT HEART CATH AND CORONARY ANGIOGRAPHY;  Surgeon: Rinaldo Cloud, MD;  Location: [facility] INVASIVE CV LAB;  Service: Cardiovascular;  Laterality: N/A;   SINUSOTOMY     TONSILLECTOMY     TUBAL LIGATION     VIDEO BRONCHOSCOPY WITH ENDOBRONCHIAL NAVIGATION N/A 03/31/2016   Procedure: VIDEO BRONCHOSCOPY WITH ENDOBRONCHIAL NAVIGATION;  Surgeon: Leslye Peer, MD;  Location: [facility] OR;  Service: Thoracic;  Laterality: N/A;     A IV Location/Drains/Wounds Patient Lines/Drains/Airways Status     Active Line/Drains/Airways     Name Placement date Placement time Site Days   Peripheral IV 05/24/22 20 G Anterior;Right Forearm 05/24/22  1336  Forearm  less than 1            Intake/Output Last 24 hours No intake or output data in the 24 hours ending 05/24/22 1745  Labs/Imaging Results for orders placed or performed during the hospital encounter of 05/24/22 (from the past 48 hour(s))  Comprehensive metabolic  panel     Status: Abnormal   Collection Time: 05/24/22  1:40 PM  Result Value Ref Range   Sodium 136 135 - 145 mmol/L   Potassium 3.7 3.5 - 5.1 mmol/L   Chloride 96 (L) 98 - 111 mmol/L   CO2 26 22 - 32 mmol/L   Glucose, Bld 153 (H) 70 - 99 mg/dL    Comment: Glucose reference range applies only to samples taken after fasting for at least 8 hours.   BUN 23 8 - 23 mg/dL   Creatinine, Ser 1.61 (H) 0.44 - 1.00 mg/dL   Calcium 8.9 8.9 - 09.6 mg/dL   Total Protein 6.6 6.5 - 8.1 g/dL   Albumin 3.4 (L) 3.5 - 5.0 g/dL   AST 39 15 - 41 U/L   ALT 36 0 - 44 U/L   Alkaline Phosphatase 64 38 - 126 U/L   Total Bilirubin 0.8 0.3 - 1.2 mg/dL   GFR, Estimated 37 (L) >60 mL/min    Comment: (NOTE) Calculated using the CKD-EPI Creatinine Equation (2021)    Anion gap 14 5 - 15    Comment: Performed at [facility] Lab, [address], [place], [place] [address]  Ethanol     Status: None   Collection Time: 05/24/22  1:40 PM  Result Value Ref Range   Alcohol, Ethyl (B) <10 <10 mg/dL    Comment: (NOTE) Lowest detectable limit for serum alcohol is 10 mg/dL.  For medical purposes only. Performed at [facility], [address] [place], [place] [address]   CBC with Differential     Status: Abnormal   Collection Time: 05/24/22  1:40 PM  Result Value Ref Range   WBC 16.0 (H) 4.0 - 10.5 K/uL   RBC 3.52 (L) 3.87 - 5.11 MIL/uL   Hemoglobin 10.8 (L) 12.0 - 15.0 g/dL   HCT 14.7 82.9 - 56.2 %   MCV 104.0 (H) 80.0 - 100.0 fL   MCH 30.7 26.0 - 34.0 pg   MCHC 29.5 (L) 30.0 - 36.0 g/dL   RDW 13.0 86.5 - 78.4 %   Platelets 117 (L) 150 - 400 K/uL   nRBC 0.1 0.0 - 0.2 %   Neutrophils Relative % 89 %   Neutro Abs 14.1 (H) 1.7 - 7.7 K/uL   Lymphocytes Relative 4 %   Lymphs Abs 0.7 0.7 - 4.0 K/uL   Monocytes Relative 6 %   Monocytes Absolute 1.0 0.1 - 1.0 K/uL   Eosinophils Relative 0 %   Eosinophils Absolute 0.0 0.0 - 0.5 K/uL   Basophils Relative 0 %   Basophils Absolute 0.0 0.0 - 0.1 K/uL    Immature Granulocytes 1 %   Abs Immature Granulocytes 0.15 (H) 0.00 - 0.07 K/uL    Comment: Performed at [facility] Lab, [address], [place], [place] [address]  Troponin I (High Sensitivity)  Status: Abnormal   Collection Time: 05/24/22  1:40 PM  Result Value Ref Range   Troponin I (High Sensitivity) 44 (H) <18 ng/L    Comment: (NOTE) Elevated high sensitivity troponin I (hsTnI) values and significant  changes across serial measurements may suggest ACS but many other  chronic and acute conditions are known to elevate hsTnI results.  Refer to the "Links" section for chest pain algorithms and additional  guidance. Performed at [facility] Lab, [address] [place], [place] [address]   Lactic acid, plasma     Status: Abnormal   Collection Time: 05/24/22  1:40 PM  Result Value Ref Range   Lactic Acid, Venous 2.0 (HH) 0.5 - 1.9 mmol/L    Comment: CRITICAL RESULT CALLED TO, READ BACK BY AND VERIFIED WITH GONZOLAS,R RN @ 1459 05/24/22 LEONARD,A Performed at [facility] Lab, [address] [place], [place] [address]   Brain natriuretic peptide     Status: None   Collection Time: 05/24/22  1:40 PM  Result Value Ref Range   B Natriuretic Peptide 62.2 0.0 - 100.0 pg/mL    Comment: Performed at [facility] Lab, [address], [place], [place] [address]  Lipase, blood     Status: None   Collection Time: 05/24/22  1:40 PM  Result Value Ref Range   Lipase 29 11 - 51 U/L    Comment: Performed at [facility] Lab, [address], [place], [place] [address]  SARS Coronavirus 2 by RT PCR (hospital order, performed in [facility] hospital lab) *cepheid single result test*     Status: None   Collection Time: 05/24/22  1:46 PM   Specimen: Nasal Swab  Result Value Ref Range   SARS Coronavirus 2 by RT PCR NEGATIVE NEGATIVE    Comment: Performed at [facility] Lab, [address], [place], [place] [address]  Urinalysis, Routine w reflex microscopic -Urine, Clean Catch      Status: Abnormal   Collection Time: 05/24/22  3:31 PM  Result Value Ref Range   Color, Urine STRAW (A) YELLOW   APPearance CLEAR CLEAR   Specific Gravity, Urine 1.013 1.005 - 1.030   pH 7.0 5.0 - 8.0   Glucose, UA >=500 (A) NEGATIVE mg/dL   Hgb urine dipstick SMALL (A) NEGATIVE   Bilirubin Urine NEGATIVE NEGATIVE   Ketones, ur NEGATIVE NEGATIVE mg/dL   Protein, ur NEGATIVE NEGATIVE mg/dL   Nitrite POSITIVE (A) NEGATIVE   Leukocytes,Ua SMALL (A) NEGATIVE   RBC / HPF 0-5 0 - 5 RBC/hpf   WBC, UA 11-20 0 - 5 WBC/hpf   Bacteria, UA FEW (A) NONE SEEN   Squamous Epithelial / HPF 0-5 0 - 5 /HPF    Comment: Performed at [facility] Lab, [address] [place], [place] [address]  Lactic acid, plasma     Status: Abnormal   Collection Time: 05/24/22  3:40 PM  Result Value Ref Range   Lactic Acid, Venous 2.6 (HH) 0.5 - 1.9 mmol/L    Comment: CRITICAL VALUE NOTED. VALUE IS CONSISTENT WITH PREVIOUSLY REPORTED/CALLED VALUE Performed at [facility] Lab, [address], [place], [place] [address]   Troponin I (High Sensitivity)     Status: Abnormal   Collection Time: 05/24/22  3:49 PM  Result Value Ref Range   Troponin I (High Sensitivity) 39 (H) <18 ng/L    Comment: (NOTE) Elevated high sensitivity troponin I (hsTnI) values and significant  changes across serial measurements may  suggest ACS but many other  chronic and acute conditions are known to elevate hsTnI results.  Refer to the "Links" section for chest pain algorithms and additional  guidance. Performed at [facility] Lab, [address], [place], [place] [address]    CT ABDOMEN PELVIS W CONTRAST  Result Date: 05/24/2022 CLINICAL DATA:  Status post XRT left-sided lung cancer. Right lower quadrant pain, vomiting and sepsis. EXAM: CT ABDOMEN AND PELVIS WITH CONTRAST TECHNIQUE: Multidetector CT imaging of the abdomen and pelvis was performed using the standard protocol following bolus administration of intravenous contrast.  RADIATION DOSE REDUCTION: This exam was performed according to the departmental dose-optimization program which includes automated exposure control, adjustment of the mA and/or kV according to patient size and/or use of iterative reconstruction technique. CONTRAST:  60mL OMNIPAQUE IOHEXOL 350 MG/ML SOLN COMPARISON:  PET-CT 05/20/2022 FINDINGS: Lower chest: Breathing motion at the lung bases. Mild scar or atelectasis. No pleural effusion. Coronary artery calcifications are seen. Coronary artery calcifications are seen. Small hiatal hernia. Hepatobiliary: Fatty liver infiltration. No obvious space-occupying liver lesion. Patent portal vein. Gallbladder is surgically absent Pancreas: Unremarkable. No pancreatic ductal dilatation or surrounding inflammatory changes. Spleen: Normal in size without focal abnormality. Adrenals/Urinary Tract: Adrenal glands are preserved. Multiple bilateral renal cysts are identified. Largest is seen upper pole on the right measuring 5.8 cm in diameter and Hounsfield unit of 10. Lower pole focus on the left as another example measures diameter 3.8 cm and has Hounsfield units of 7. Few other smaller foci identified. These are Bosniak 1 and 2 lesions. No specific imaging follow-up. However there is an ill-defined area in the anterior aspect of the left kidney best seen on delayed series 13, image 14 measuring 19 mm. Hounsfield units of 105 on delayed and 99 on portal venous phase. Margins are somewhat difficult to define as there is motion throughout the examination. There is some perinephric stranding. Based on appearance this could represent edema of the parenchyma and an area of pyelonephritis with the stranding. Please correlate with clinical presentation. Preserved contours of the urinary bladder. Stomach/Bowel: Sigmoid colon diverticula. Scattered colonic stool. Normal appendix. Stomach and small bowel are nondilated. Vascular/Lymphatic: Extensive vascular calcifications identified  along the aorta and branch vessels with areas of potential stenosis including the iliac vessels, SMA and renal arteries. Please correlate with the history. No specific abnormal lymph node enlargement present in the abdomen and pelvis. Reproductive: Uterus and bilateral adnexa are unremarkable. Other: No abdominal wall hernia or abnormality. No abdominopelvic ascites. Musculoskeletal: Trace anterolisthesis of L3 on L4. Multilevel compression deformities along the spine with the margins are sclerotic. Favor a more chronic process. IMPRESSION: Study limited by motion throughout the examination. There is some ill-defined area enhancement along the left mid kidney with stranding. Possibilities would include an area of pyelonephritis. Please correlate with history. Otherwise would recommend follow up evaluation when the patient is more able to tolerate the study without motion. Separate bilateral benign-appearing renal cysts. No bowel obstruction.  Colonic diverticulosis. Multilevel compression deformities along the spine but the margins are somewhat sclerotic. Favor more of a chronic process. Please correlate with the history. Extensive vascular calcifications including the aorta and branch vessels. Please correlate with any particular symptoms. Electronically Signed   By: Karen Kays M.D.   On: 05/24/2022 15:34   CT Head Wo Contrast  Result Date: 05/24/2022 CLINICAL DATA:  Altered mental status EXAM: CT HEAD WITHOUT CONTRAST TECHNIQUE: Contiguous axial images were obtained from the base of the skull  through the vertex without intravenous contrast. RADIATION DOSE REDUCTION: This exam was performed according to the departmental dose-optimization program which includes automated exposure control, adjustment of the mA and/or kV according to patient size and/or use of iterative reconstruction technique. COMPARISON:  Paranasal sinus CT 11/30/2017 and CT head 03/28/2005 FINDINGS: Brain: No intracranial hemorrhage, mass  effect, or evidence of acute infarct. No hydrocephalus. No extra-axial fluid collection. Generalized cerebral atrophy. Ill-defined hypoattenuation within the cerebral white matter is nonspecific but consistent with chronic small vessel ischemic disease. Physiologic basal ganglia calcification. Vascular: No hyperdense vessel. Intracranial arterial calcification. Skull: No fracture or focal lesion. Sinuses/Orbits: No acute finding. Other: None. IMPRESSION: 1. No evidence of acute intracranial abnormality. 2. Chronic small vessel ischemic disease and cerebral atrophy. Electronically Signed   By: Minerva Fester M.D.   On: 05/24/2022 15:25   DG Chest Portable 1 View  Result Date: 05/24/2022 CLINICAL DATA:  Altered mental status.  Sepsis workup. EXAM: PORTABLE CHEST 1 VIEW COMPARISON:  CT chest 04/20/2022 FINDINGS: The cardiomediastinal silhouette is stable. Bandlike opacity in the left perihilar region with associated architectural distortion is similar to the prior study. The lingular nodule seen on the recent PET-CT is not well appreciated on the current study. There is no new or worsening focal airspace disease. There is no pulmonary edema. There is no pleural effusion or pneumothorax There is no acute osseous abnormality. IMPRESSION: No radiographic evidence of acute cardiopulmonary process. Electronically Signed   By: Lesia Hausen M.D.   On: 05/24/2022 14:15    Pending Labs Unresulted Labs (From admission, onward)     Start     Ordered   05/31/22 0500  Creatinine, serum  (enoxaparin (LOVENOX)    CrCl >/= 30 ml/min)  Weekly,   R     Comments: while on enoxaparin therapy    05/24/22 1624   05/25/22 0500  Protime-INR  Tomorrow morning,   R        05/24/22 1624   05/25/22 0500  Cortisol-am, blood  Tomorrow morning,   R        05/24/22 1624   05/25/22 0500  Procalcitonin  Tomorrow morning,   R       References:    Procalcitonin Lower Respiratory Tract Infection AND Sepsis Procalcitonin Algorithm    05/24/22 1624   05/25/22 0500  CBC  Tomorrow morning,   R        05/24/22 1624   05/25/22 0500  Comprehensive metabolic panel  Tomorrow morning,   R        05/24/22 1624   05/24/22 1623  Procalcitonin  Add-on,   AD       References:    Procalcitonin Lower Respiratory Tract Infection AND Sepsis Procalcitonin Algorithm   05/24/22 1624   05/24/22 1342  Blood culture (routine x 2)  BLOOD CULTURE X 2,   R      05/24/22 1342            Vitals/Pain Today's Vitals   05/24/22 1436 05/24/22 1600 05/24/22 1610 05/24/22 1730  BP:   (!) 145/71   Pulse:  (!) 134 (!) 133   Resp:  (!) 23 (!) 26   Temp:    98.3 F (36.8 C)  TempSrc:    Oral  SpO2:  96% 98%   Weight:      Height:      PainSc: 5        Isolation Precautions Airborne and Contact precautions  Medications Medications  vancomycin (  VANCOREADY) IVPB 1500 mg/300 mL (1,500 mg Intravenous New Bag/Given 05/24/22 1629)  aspirin EC tablet 81 mg (has no administration in time range)  amLODipine (NORVASC) tablet 5 mg (has no administration in time range)  atorvastatin (LIPITOR) tablet 20 mg (has no administration in time range)  metoprolol succinate (TOPROL-XL) 24 hr tablet 25 mg (has no administration in time range)  pantoprazole (PROTONIX) EC tablet 40 mg (has no administration in time range)  clopidogrel (PLAVIX) tablet 75 mg (has no administration in time range)  fluticasone furoate-vilanterol (BREO ELLIPTA) 100-25 MCG/ACT 1 puff (has no administration in time range)    And  umeclidinium bromide (INCRUSE ELLIPTA) 62.5 MCG/ACT 1 puff (has no administration in time range)  albuterol (VENTOLIN HFA) 108 (90 Base) MCG/ACT inhaler 1-2 puff (has no administration in time range)  enoxaparin (LOVENOX) injection 40 mg (has no administration in time range)  sodium chloride flush (NS) 0.9 % injection 3 mL (has no administration in time range)  lactated ringers infusion ( Intravenous New Bag/Given 05/24/22 1707)  cefTRIAXone (ROCEPHIN) 2 g in  sodium chloride 0.9 % 100 mL IVPB (has no administration in time range)  acetaminophen (TYLENOL) tablet 650 mg (has no administration in time range)    Or  acetaminophen (TYLENOL) suppository 650 mg (has no administration in time range)  polyethylene glycol (MIRALAX / GLYCOLAX) packet 17 g (has no administration in time range)  predniSONE (DELTASONE) tablet 10 mg (has no administration in time range)  methocarbamol (ROBAXIN) tablet 500 mg (has no administration in time range)  lactated ringers bolus 1,000 mL (0 mLs Intravenous Stopped 05/24/22 1517)  ceFEPIme (MAXIPIME) 2 g in sodium chloride 0.9 % 100 mL IVPB (0 g Intravenous Stopped 05/24/22 1520)  metroNIDAZOLE (FLAGYL) IVPB 500 mg (0 mg Intravenous Stopped 05/24/22 1625)  acetaminophen (TYLENOL) tablet 1,000 mg (1,000 mg Oral Given 05/24/22 1432)  morphine (PF) 4 MG/ML injection 4 mg (4 mg Intravenous Given 05/24/22 1430)  ondansetron (ZOFRAN) injection 4 mg (4 mg Intravenous Given 05/24/22 1430)  ipratropium-albuterol (DUONEB) 0.5-2.5 (3) MG/3ML nebulizer solution 3 mL (3 mLs Nebulization Given 05/24/22 1437)  lactated ringers bolus 1,000 mL (0 mLs Intravenous Stopped 05/24/22 1706)  iohexol (OMNIPAQUE) 350 MG/ML injection 60 mL (60 mLs Intravenous Contrast Given 05/24/22 1520)    Mobility walks with rollator in house, uses wheelchair out and about.     Focused Assessments    R Recommendations: See Admitting Provider Note  Report given to:   Additional Notes:  Patient currently A&Ox4. Patient wears 4-6L O2 at baseline.

## 2022-05-25 ENCOUNTER — Inpatient Hospital Stay (HOSPITAL_COMMUNITY): Payer: Medicare PPO

## 2022-05-25 ENCOUNTER — Telehealth: Payer: Self-pay | Admitting: Internal Medicine

## 2022-05-25 ENCOUNTER — Inpatient Hospital Stay: Payer: Medicare PPO | Admitting: Internal Medicine

## 2022-05-25 DIAGNOSIS — J449 Chronic obstructive pulmonary disease, unspecified: Secondary | ICD-10-CM | POA: Diagnosis not present

## 2022-05-25 DIAGNOSIS — A419 Sepsis, unspecified organism: Secondary | ICD-10-CM | POA: Diagnosis not present

## 2022-05-25 DIAGNOSIS — I2510 Atherosclerotic heart disease of native coronary artery without angina pectoris: Secondary | ICD-10-CM | POA: Diagnosis not present

## 2022-05-25 DIAGNOSIS — C3492 Malignant neoplasm of unspecified part of left bronchus or lung: Secondary | ICD-10-CM | POA: Diagnosis not present

## 2022-05-25 DIAGNOSIS — J9621 Acute and chronic respiratory failure with hypoxia: Secondary | ICD-10-CM

## 2022-05-25 DIAGNOSIS — J9622 Acute and chronic respiratory failure with hypercapnia: Secondary | ICD-10-CM

## 2022-05-25 LAB — POCT I-STAT 7, (LYTES, BLD GAS, ICA,H+H)
Acid-Base Excess: 1 mmol/L (ref 0.0–2.0)
Bicarbonate: 26.5 mmol/L (ref 20.0–28.0)
Calcium, Ion: 1.22 mmol/L (ref 1.15–1.40)
HCT: 31 % — ABNORMAL LOW (ref 36.0–46.0)
Hemoglobin: 10.5 g/dL — ABNORMAL LOW (ref 12.0–15.0)
O2 Saturation: 98 %
Patient temperature: 98.2
Potassium: 3.7 mmol/L (ref 3.5–5.1)
Sodium: 139 mmol/L (ref 135–145)
TCO2: 28 mmol/L (ref 22–32)
pCO2 arterial: 42.4 mmHg (ref 32–48)
pH, Arterial: 7.403 (ref 7.35–7.45)
pO2, Arterial: 96 mmHg (ref 83–108)

## 2022-05-25 LAB — BLOOD CULTURE ID PANEL (REFLEXED) - BCID2

## 2022-05-25 LAB — CBC
HCT: 33.6 % — ABNORMAL LOW (ref 36.0–46.0)
Hemoglobin: 10.3 g/dL — ABNORMAL LOW (ref 12.0–15.0)
MCH: 31.4 pg (ref 26.0–34.0)
MCHC: 30.7 g/dL (ref 30.0–36.0)
MCV: 102.4 fL — ABNORMAL HIGH (ref 80.0–100.0)
Platelets: 111 10*3/uL — ABNORMAL LOW (ref 150–400)
RBC: 3.28 MIL/uL — ABNORMAL LOW (ref 3.87–5.11)
RDW: 12 % (ref 11.5–15.5)
WBC: 18.6 10*3/uL — ABNORMAL HIGH (ref 4.0–10.5)
nRBC: 0 % (ref 0.0–0.2)

## 2022-05-25 LAB — PROCALCITONIN: Procalcitonin: 31.64 ng/mL

## 2022-05-25 LAB — COMPREHENSIVE METABOLIC PANEL
ALT: 30 U/L (ref 0–44)
AST: 34 U/L (ref 15–41)
Albumin: 3 g/dL — ABNORMAL LOW (ref 3.5–5.0)
Alkaline Phosphatase: 65 U/L (ref 38–126)
Anion gap: 14 (ref 5–15)
BUN: 17 mg/dL (ref 8–23)
CO2: 24 mmol/L (ref 22–32)
Calcium: 8.9 mg/dL (ref 8.9–10.3)
Chloride: 102 mmol/L (ref 98–111)
Creatinine, Ser: 1.47 mg/dL — ABNORMAL HIGH (ref 0.44–1.00)
GFR, Estimated: 37 mL/min — ABNORMAL LOW (ref >60)
Glucose, Bld: 139 mg/dL — ABNORMAL HIGH (ref 70–99)
Potassium: 4.2 mmol/L (ref 3.5–5.1)
Sodium: 140 mmol/L (ref 135–145)
Total Bilirubin: 0.7 mg/dL (ref 0.3–1.2)
Total Protein: 6.3 g/dL — ABNORMAL LOW (ref 6.5–8.1)

## 2022-05-25 LAB — GLUCOSE, CAPILLARY
Glucose-Capillary: 162 mg/dL — ABNORMAL HIGH (ref 70–99)
Glucose-Capillary: 178 mg/dL — ABNORMAL HIGH (ref 70–99)
Glucose-Capillary: 189 mg/dL — ABNORMAL HIGH (ref 70–99)
Glucose-Capillary: 198 mg/dL — ABNORMAL HIGH (ref 70–99)

## 2022-05-25 LAB — PROTIME-INR
INR: 1.5 — ABNORMAL HIGH (ref 0.8–1.2)
Prothrombin Time: 17.5 seconds — ABNORMAL HIGH (ref 11.4–15.2)

## 2022-05-25 LAB — CULTURE, BLOOD (ROUTINE X 2): Culture: NO GROWTH

## 2022-05-25 LAB — MRSA NEXT GEN BY PCR, NASAL: MRSA by PCR Next Gen: NOT DETECTED

## 2022-05-25 LAB — CORTISOL-AM, BLOOD: Cortisol - AM: 16 ug/dL (ref 6.7–22.6)

## 2022-05-25 MED ORDER — ORAL CARE MOUTH RINSE
15.0000 mL | OROMUCOSAL | Status: DC
  Administered 2022-05-25 – 2022-05-27 (×21): 15 mL via OROMUCOSAL

## 2022-05-25 MED ORDER — METHYLPREDNISOLONE SODIUM SUCC 40 MG IJ SOLR: 40.0000 mg | INTRAMUSCULAR | Status: DC

## 2022-05-25 MED ORDER — MIDAZOLAM HCL 2 MG/2ML IJ SOLN
INTRAMUSCULAR | Status: AC
  Filled 2022-05-25: qty 2

## 2022-05-25 MED ORDER — HYDRALAZINE HCL 20 MG/ML IJ SOLN
10.0000 mg | Freq: Four times a day (QID) | INTRAMUSCULAR | Status: DC | PRN
  Filled 2022-05-25: qty 1

## 2022-05-25 MED ORDER — POLYETHYLENE GLYCOL 3350 17 G PO PACK
17.0000 g | PACK | Freq: Two times a day (BID) | ORAL | Status: DC
  Administered 2022-05-25 – 2022-05-30 (×6): 17 g via ORAL
  Filled 2022-05-25 (×10): qty 1

## 2022-05-25 MED ORDER — REVEFENACIN 175 MCG/3ML IN SOLN
175.0000 ug | Freq: Every day | RESPIRATORY_TRACT | Status: DC
  Administered 2022-05-25 – 2022-05-31 (×6): 175 ug via RESPIRATORY_TRACT
  Filled 2022-05-25 (×6): qty 3

## 2022-05-25 MED ORDER — LIDOCAINE 5 % EX PTCH
1.0000 | MEDICATED_PATCH | CUTANEOUS | Status: AC
  Administered 2022-05-25 – 2022-05-26 (×2): 1 via TRANSDERMAL
  Filled 2022-05-25 (×2): qty 1

## 2022-05-25 MED ORDER — IPRATROPIUM BROMIDE 0.02 % IN SOLN
0.5000 mg | Freq: Three times a day (TID) | RESPIRATORY_TRACT | Status: DC
  Administered 2022-05-25 – 2022-05-29 (×11): 0.5 mg via RESPIRATORY_TRACT
  Filled 2022-05-25 (×12): qty 2.5

## 2022-05-25 MED ORDER — FUROSEMIDE 10 MG/ML IJ SOLN
40.0000 mg | Freq: Once | INTRAMUSCULAR | Status: AC
  Administered 2022-05-25: 40 mg via INTRAVENOUS
  Filled 2022-05-25: qty 4

## 2022-05-25 MED ORDER — SUCCINYLCHOLINE CHLORIDE 200 MG/10ML IV SOSY
PREFILLED_SYRINGE | INTRAVENOUS | Status: AC
  Filled 2022-05-25: qty 10

## 2022-05-25 MED ORDER — ROCURONIUM BROMIDE 10 MG/ML (PF) SYRINGE
PREFILLED_SYRINGE | INTRAVENOUS | Status: AC
  Filled 2022-05-25: qty 10

## 2022-05-25 MED ORDER — DOCUSATE SODIUM 100 MG PO CAPS
100.0000 mg | ORAL_CAPSULE | Freq: Two times a day (BID) | ORAL | Status: DC
  Administered 2022-05-25 – 2022-05-30 (×10): 100 mg via ORAL
  Filled 2022-05-25 (×12): qty 1

## 2022-05-25 MED ORDER — ETOMIDATE 2 MG/ML IV SOLN
INTRAVENOUS | Status: AC
  Filled 2022-05-25: qty 20

## 2022-05-25 MED ORDER — METHYLPREDNISOLONE SODIUM SUCC 40 MG IJ SOLR
40.0000 mg | Freq: Two times a day (BID) | INTRAMUSCULAR | Status: DC
  Administered 2022-05-25: 40 mg via INTRAVENOUS
  Filled 2022-05-25: qty 1

## 2022-05-25 MED ORDER — LEVALBUTEROL HCL 0.63 MG/3ML IN NEBU
0.6300 mg | INHALATION_SOLUTION | Freq: Three times a day (TID) | RESPIRATORY_TRACT | Status: DC
  Administered 2022-05-25 – 2022-05-29 (×11): 0.63 mg via RESPIRATORY_TRACT
  Filled 2022-05-25 (×12): qty 3

## 2022-05-25 MED ORDER — ORAL CARE MOUTH RINSE: 15.0000 mL | OROMUCOSAL | Status: DC | PRN

## 2022-05-25 MED ORDER — LORAZEPAM 2 MG/ML IJ SOLN
0.5000 mg | Freq: Once | INTRAMUSCULAR | Status: AC
  Administered 2022-05-25: 0.5 mg via INTRAVENOUS
  Filled 2022-05-25: qty 1

## 2022-05-25 MED ORDER — CHLORHEXIDINE GLUCONATE CLOTH 2 % EX PADS
6.0000 | MEDICATED_PAD | Freq: Every day | CUTANEOUS | Status: DC
  Administered 2022-05-25 – 2022-05-31 (×7): 6 via TOPICAL

## 2022-05-25 MED ORDER — FENTANYL CITRATE PF 50 MCG/ML IJ SOSY
PREFILLED_SYRINGE | INTRAMUSCULAR | Status: AC
  Filled 2022-05-25: qty 2

## 2022-05-25 MED ORDER — LEVALBUTEROL HCL 0.63 MG/3ML IN NEBU: 0.6300 mg | INHALATION_SOLUTION | Freq: Four times a day (QID) | RESPIRATORY_TRACT | Status: DC | PRN

## 2022-05-25 MED ORDER — BUDESONIDE 0.25 MG/2ML IN SUSP
0.2500 mg | Freq: Two times a day (BID) | RESPIRATORY_TRACT | Status: DC
  Administered 2022-05-25 – 2022-05-31 (×12): 0.25 mg via RESPIRATORY_TRACT
  Filled 2022-05-25 (×14): qty 2

## 2022-05-25 MED ORDER — ARFORMOTEROL TARTRATE 15 MCG/2ML IN NEBU
15.0000 ug | INHALATION_SOLUTION | Freq: Two times a day (BID) | RESPIRATORY_TRACT | Status: DC
  Administered 2022-05-25 – 2022-05-31 (×13): 15 ug via RESPIRATORY_TRACT
  Filled 2022-05-25 (×15): qty 2

## 2022-05-25 NOTE — Progress Notes (Signed)
PCCM INTERVAL PROGRESS NOTE   Called to bedside for patient with respiratory failure and poor responsiveness. Briefly, this is a 77 year old female with end-stage COPD, chronic respiratory failure on home O2, and OSA non-compliant with CPAP. Now admitted with proteus UTI and bacteremia. Moved to ICU earlier today after getting IV fluid and developing respiratory distress. She fortunately did improve with BiPAP.   Now tonight she was found to be unresponsive to noxious stimuli with associated respiratory distress (accessory muscle use) by RN. EMD was contacted who called for ground team to bedside for intubation. When patient was stuck for ABG she suddenly woke up. She was placed on BiPAP. ABG unremarkable indicating hypercapnia was not the cause of AMS.   Upon our arrival to bedside she is awake and interactive on BiPAP No distress  Examined with Dr. Celine Mans Will continue to monitor her on BiPAP overnight Low threshold for intubation should she worsen   Joneen Roach, AGACNP-BC [facility]  See Amion for personal pager PCCM on call pager [phone] until 7pm. Please call Elink 7p-7a. [phone]  05/25/2022 8:32 PM

## 2022-05-25 NOTE — Consult Note (Signed)
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  She been on noninvasive overnight and is not improving.  She is now in acute respiratory distress heart rate has been 140 respiratory rate is 42 and she is obviously tiring out.  Due to her complex medical history as documented below she be transferred to intensive care unit and most likely will require intubation unless her CODE STATUS is changed.  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   "I'm in remission" (04/05/2017)   Adenocarcinoma, lung (HCC)    Anxiety    Arthritis    "legs, elbows" (04/05/2017)   Bronchitis, chronic (HCC)    "haven't had it in awhile" (04/05/2017)   CHF (congestive heart failure) (HCC)    COPD (chronic obstructive pulmonary disease) (HCC)    Coronary artery disease    Daily headache    "recently" (04/05/2017)   Depression    Dyspnea    with exertion   GERD (gastroesophageal reflux disease)    History of blood transfusion 1950s   History of radiation therapy 05/04/16 - 05/12/16   Left lung treated to 54 Gy with 3 fx of 18 Gy   Hypertension    On home oxygen therapy    "2L; 24/7" (04/05/2017)   OSA (obstructive sleep apnea)    OSA on CPAP    Pneumonia 10/2015   "at least 3 times" (04/05/2017)   Pulmonary hypertension (HCC)      Significant Hospital Events: Including procedures, antibiotic start and stop dates in addition to other pertinent events     Interim History / Subjective:  Failing BiPAP being transferred to ICU  Objective   Blood pressure (!) 140/72, pulse (!) 127,  temperature (!) 101.2 F (38.4 C), temperature source Axillary, resp. rate (!) 35, height 5\' 2"  (1.575 m), weight 74 kg, SpO2 95 %.    FiO2 (%):  [40 %] 40 %   Intake/Output Summary (Last 24 hours) at 05/25/2022 1107 Last data filed at 05/25/2022 1024 Gross per 24 hour  Intake 100 ml  Output 500 ml  Net -400 ml   Filed Weights   05/24/22 1335  Weight: 74 kg    Examination: General: Morbid obese female on noninvasive somewhat lethargic HENT: Full facemask is in place Lungs: Decreased air movement  Cardiovascular: heart sounds are distant Abdomen: Distended tender Extremities: 2+ edema Neuro: Somewhat somnolent but will follow commands GU: Shonna Chock is in place  Resolved Hospital Problem list     Assessment & Plan:  Acute on chronic hypoxic respiratory failure now failing noninvasive mechanical ventilator support need to be transferred to intensive care unit and most likely intubated.  Note she is on 6-8 L of home oxygen.  History of lung cancer adenocarcinoma treated with radiation OSA Transfer to intensive care unit Most likely will need intubation Continue antibiotics Continue BiPAP until intubated Continue steroids Continue bronchodilators Diuresis as tolerated Consider end-of-life discussions  History of coronary artery disease with PCI/congestive heart failure Monitor in intensive care unit Diuresis  GERD PPI  Abdominal distention When intubated place OG tube  Consider abdominal film  Morbid obesity Weight loss  Best Practice (right click and "Reselect all SmartList Selections" daily)   Diet/type: NPO DVT prophylaxis: LMWH GI prophylaxis: N/A and PPI Lines: N/A Foley:  N/A Code Status:  full code Last date of multidisciplinary goals of care discussion [tbd] Unable to contact husband at this time Labs   CBC: Recent Labs  Lab 05/24/22 1340 05/25/22 0553  WBC 16.0* 18.6*  NEUTROABS 14.1*  --   HGB 10.8* 10.3*  HCT 36.6 33.6*  MCV 104.0* 102.4*   PLT 117* 111*    Basic Metabolic Panel: Recent Labs  Lab 05/24/22 1340 05/25/22 0553  NA 136 140  K 3.7 4.2  CL 96* 102  CO2 26 24  GLUCOSE 153* 139*  BUN 23 17  CREATININE 1.47* 1.47*  CALCIUM 8.9 8.9   GFR: Estimated Creatinine Clearance: 30.7 mL/min (A) (by C-G formula based on SCr of 1.47 mg/dL (H)). Recent Labs  Lab 05/24/22 1340 05/24/22 1540 05/24/22 1549 05/25/22 0553  PROCALCITON  --   --  22.47 31.64  WBC 16.0*  --   --  18.6*  LATICACIDVEN 2.0* 2.6*  --   --     Liver Function Tests: Recent Labs  Lab 05/24/22 1340 05/25/22 0553  AST 39 34  ALT 36 30  ALKPHOS 64 65  BILITOT 0.8 0.7  PROT 6.6 6.3*  ALBUMIN 3.4* 3.0*   Recent Labs  Lab 05/24/22 1340  LIPASE 29   No results for input(s): "AMMONIA" in the last 168 hours.  ABG    Component Value Date/Time   PHART 7.37 05/24/2022 2201   PCO2ART 44 05/24/2022 2201   PO2ART 110 (H) 05/24/2022 2201   HCO3 25.4 05/24/2022 2201   TCO2 31 03/22/2021 1059   ACIDBASEDEF 0.1 05/24/2022 2201   O2SAT 99.3 05/24/2022 2201     Coagulation Profile: Recent Labs  Lab 05/25/22 0553  INR 1.5*    Cardiac Enzymes: No results for input(s): "CKTOTAL", "CKMB", "CKMBINDEX", "TROPONINI" in the last 168 hours.  HbA1C: Hgb A1c MFr Bld  Date/Time Value Ref Range Status  09/16/2020 04:30 AM 6.4 (H) 4.8 - 5.6 % Final    Comment:    (NOTE) Pre diabetes:          5.7%-6.4%  Diabetes:              >6.4%  Glycemic control for   <7.0% adults with diabetes   12/22/2018 06:16 PM 5.4 4.8 - 5.6 % Final    Comment:    (NOTE) Pre diabetes:          5.7%-6.4% Diabetes:              >6.4% Glycemic control for   <7.0% adults with diabetes     CBG: Recent Labs  Lab 05/20/22 0720  GLUCAP 127*    Review of Systems:   na  Past Medical History:  She,  has a past medical history of Adenocarcinoma of left lung, stage 1 (HCC) (04/15/2016), Adenocarcinoma, lung (HCC), Anxiety, Arthritis, Bronchitis, chronic  (HCC), CHF (congestive heart failure) (HCC), COPD (chronic obstructive pulmonary disease) (HCC), Coronary artery disease, Daily headache, Depression, Dyspnea, GERD (gastroesophageal reflux disease), History of blood transfusion (1950s), History of radiation therapy (05/04/16 - 05/12/16), Hypertension, On home oxygen therapy, OSA (obstructive sleep apnea), OSA on CPAP, Pneumonia (10/2015), and Pulmonary hypertension (HCC).   Surgical History:   Past Surgical History:  Procedure Laterality Date   COLONOSCOPY     CORONARY ANGIOPLASTY WITH  STENT PLACEMENT  04/05/2017   CORONARY STENT INTERVENTION N/A 04/05/2017   Procedure: CORONARY STENT INTERVENTION;  Surgeon: Rinaldo Cloud, MD;  Location: [facility];  Service: Cardiovascular;  Laterality: N/A;   DILATION AND CURETTAGE OF UTERUS     FUDUCIAL PLACEMENT Left 03/31/2016   Procedure: PLACEMENT OF FUDUCIAL LEFT UPPER LOBE;  Surgeon: Leslye Peer, MD;  Location: [facility] OR;  Service: Thoracic;  Laterality: Left;   LAPAROSCOPIC CHOLECYSTECTOMY     LEFT HEART CATH AND CORONARY ANGIOGRAPHY N/A 02/17/2017   Procedure: LEFT HEART CATH AND CORONARY ANGIOGRAPHY;  Surgeon: Rinaldo Cloud, MD;  Location: [facility];  Service: Cardiovascular;  Laterality: N/A;   SINUSOTOMY     TONSILLECTOMY     TUBAL LIGATION     VIDEO BRONCHOSCOPY WITH ENDOBRONCHIAL NAVIGATION N/A 03/31/2016   Procedure: VIDEO BRONCHOSCOPY WITH ENDOBRONCHIAL NAVIGATION;  Surgeon: Leslye Peer, MD;  Location: [facility] OR;  Service: Thoracic;  Laterality: N/A;     Social History:   reports that she quit smoking about 7 years ago. Her smoking use included cigarettes. She has a 40.00 pack-year smoking history. She has never used smokeless tobacco. She reports that she does not drink alcohol and does not use drugs.   Family History:  Her family history includes Heart attack in her father and mother.   Allergies No Known Allergies   Home Medications  Prior to Admission medications    Medication Sig Start Date End Date Taking? Authorizing Provider  acetaminophen (TYLENOL) 500 MG tablet Take 1 tablet (500 mg total) by mouth every 6 (six) hours as needed. 03/22/21   Derwood Kaplan, MD  albuterol (PROVENTIL) (2.5 MG/3ML) 0.083% nebulizer solution Use every 4 hours and as needed for shortness of breath. 09/25/21   Nyoka Cowden, MD  albuterol (VENTOLIN HFA) 108 (90 Base) MCG/ACT inhaler INHALE 2 PUFFS BY MOUTH EVERY 4 HOURS AS NEEDED FOR WHEEZING FOR SHORTNESS OF BREATH 05/21/22   Nyoka Cowden, MD  amLODipine (NORVASC) 5 MG tablet Take 5 mg by mouth daily.    [provider]  aspirin EC 81 MG tablet Take 81 mg by mouth daily. Swallow whole.    [provider]  atorvastatin (LIPITOR) 20 MG tablet Take 20 mg by mouth at bedtime. 08/21/20   [provider]  Azelastine-Fluticasone 137-50 MCG/ACT SUSP 2 PUFFS INTO EACH NOSTRIL AT BEDTIME Patient not taking: Reported on 11/26/2021 04/22/20   Nyoka Cowden, MD  azithromycin (ZITHROMAX) 250 MG tablet Take 1 tablet (250 mg total) by mouth daily. 01/07/22   Nyoka Cowden, MD  benzonatate (TESSALON) 200 MG capsule Take 1 capsule (200 mg total) by mouth 3 (three) times daily as needed for cough. Patient not taking: Reported on 11/26/2021 09/22/20   Osvaldo Shipper, MD  cetirizine (ZYRTEC) 10 MG tablet Take 10 mg by mouth at bedtime.    [provider]  cholecalciferol (VITAMIN D3) 25 MCG (1000 UNIT) tablet Take 1,000 Units by mouth daily.    [provider]  clopidogrel (PLAVIX) 75 MG tablet Take 75 mg by mouth every morning.     [provider]  Cyanocobalamin (B-12 PO) Take 1 tablet by mouth daily.    [provider]  famotidine (PEPCID) 20 MG tablet One after supper 02/12/19   Nyoka Cowden, MD  ferrous sulfate 325 (65 FE) MG EC tablet Take 325 mg by mouth 3 (three) times daily with meals.     [provider]  Fluticasone-Umeclidin-Vilant (  TRELEGY ELLIPTA)  100-62.5-25 MCG/ACT AEPB INHALE 1 PUFF ONCE DAILY 03/02/22   Nyoka Cowden, MD  hydrochlorothiazide (MICROZIDE) 12.5 MG capsule Take 12.5 mg by mouth daily. Patient not taking: Reported on 11/26/2021    [provider]  ibuprofen (ADVIL) 200 MG tablet Take 400 mg by mouth every 6 (six) hours as needed (pain). Patient not taking: Reported on 11/26/2021    [provider]  Lidocaine 4 % PTCH Apply 1 patch topically 2 (two) times daily. Patient not taking: Reported on 11/26/2021 03/22/21   Derwood Kaplan, MD  losartan (COZAAR) 50 MG tablet Take 50 mg by mouth every morning. 11/15/18   [provider]  methocarbamol (ROBAXIN) 500 MG tablet Take by mouth. 05/14/21   [provider]  metoprolol succinate (TOPROL-XL) 25 MG 24 hr tablet Take 25 mg by mouth daily.    [provider]  naproxen (NAPROSYN) 250 MG tablet Take 1 tablet (250 mg total) by mouth 2 (two) times daily with a meal. 03/22/21   Derwood Kaplan, MD  nitroGLYCERIN (NITRODUR - DOSED IN MG/24 HR) 0.4 mg/hr patch Place 0.4 mg onto the skin every morning. 08/10/17   [provider]  nitroGLYCERIN (NITROSTAT) 0.4 MG SL tablet Place 0.4 mg under the tongue every 5 (five) minutes as needed for chest pain.    [provider]  OVER THE COUNTER MEDICATION Take 1 tablet by mouth at bedtime. OTC sleep aid - unknown ingredients    [provider]  OVER THE COUNTER MEDICATION Take 3,000 mcg by mouth daily. Vitamin B12 tablet daily    [provider]  OXYGEN Inhale 4-6 L into the lungs continuous.    [provider]  pantoprazole (PROTONIX) 40 MG tablet Take 30-60 min before first meal of the day Patient taking differently: Take 40 mg by mouth daily before breakfast. 08/08/18   Nyoka Cowden, MD  Polyvinyl Alcohol-Povidone (REFRESH OP) Place 1 drop into both eyes daily as needed (dry eyes).    [provider]  predniSONE (DELTASONE) 10 MG tablet TAKE 2  TABLETS BY MOUTH ONCE DAILY UNTIL  FEELING  BETTER  THEN  TAKE  1  TABLET  DAILY 04/26/22   Nyoka Cowden, MD  sertraline (ZOLOFT) 100 MG tablet Take 100 mg by mouth daily.    [provider]  tiZANidine (ZANAFLEX) 4 MG tablet Take 1 tablet (4 mg total) by mouth every 8 (eight) hours as needed for muscle spasms. Patient not taking: Reported on 11/26/2021 03/22/21   Derwood Kaplan, MD     Critical care time: 45 min   Brett Canales Tobi Groesbeck ACNP Acute Care Nurse Practitioner [facility] Please consult Amion 05/25/2022, 11:07 AM

## 2022-05-25 NOTE — Progress Notes (Signed)
On call provider notified of pt being a red MEWS due to HR, BP and RR, no new orders. MEWS protocol followed.

## 2022-05-25 NOTE — Progress Notes (Signed)
PHARMACY - PHYSICIAN COMMUNICATION CRITICAL VALUE ALERT - BLOOD CULTURE IDENTIFICATION (BCID)  Suzanne Dickson is an 77 y.o. female who presented to [facility] on 05/24/2022 with a chief complaint of altered mental status, nausea, vomiting, dysuria.  Assessment:  Blood cultures 1 out of 4 (anaerobic bottle)  growing GNR. BCID showing Proteus. Source is UTI.    Name of physician (or Provider) Contacted: Dr. Jerral Ralph  Current antibiotics: ceftriaxone  Changes to prescribed antibiotics recommended:  Patient is on recommended antibiotics - No changes needed  Results for orders placed or performed during the hospital encounter of 05/24/22  Blood Culture ID Panel (Reflexed) (Collected: 05/24/2022  1:40 PM)  Result Value Ref Range   Enterococcus faecalis NOT DETECTED NOT DETECTED   Enterococcus Faecium NOT DETECTED NOT DETECTED   Listeria monocytogenes NOT DETECTED NOT DETECTED   Staphylococcus species NOT DETECTED NOT DETECTED   Staphylococcus aureus (BCID) NOT DETECTED NOT DETECTED   Staphylococcus epidermidis NOT DETECTED NOT DETECTED   Staphylococcus lugdunensis NOT DETECTED NOT DETECTED   Streptococcus species NOT DETECTED NOT DETECTED   Streptococcus agalactiae NOT DETECTED NOT DETECTED   Streptococcus pneumoniae NOT DETECTED NOT DETECTED   Streptococcus pyogenes NOT DETECTED NOT DETECTED   A.calcoaceticus-baumannii NOT DETECTED NOT DETECTED   Bacteroides fragilis NOT DETECTED NOT DETECTED   Enterobacterales DETECTED (A) NOT DETECTED   Enterobacter cloacae complex NOT DETECTED NOT DETECTED   Escherichia coli NOT DETECTED NOT DETECTED   Klebsiella aerogenes NOT DETECTED NOT DETECTED   Klebsiella oxytoca NOT DETECTED NOT DETECTED   Klebsiella pneumoniae NOT DETECTED NOT DETECTED   Proteus species DETECTED (A) NOT DETECTED   Salmonella species NOT DETECTED NOT DETECTED   Serratia marcescens NOT DETECTED NOT DETECTED   Haemophilus influenzae NOT DETECTED NOT DETECTED   Neisseria  meningitidis NOT DETECTED NOT DETECTED   Pseudomonas aeruginosa NOT DETECTED NOT DETECTED   Stenotrophomonas maltophilia NOT DETECTED NOT DETECTED   Candida albicans NOT DETECTED NOT DETECTED   Candida auris NOT DETECTED NOT DETECTED   Candida glabrata NOT DETECTED NOT DETECTED   Candida krusei NOT DETECTED NOT DETECTED   Candida parapsilosis NOT DETECTED NOT DETECTED   Candida tropicalis NOT DETECTED NOT DETECTED   Cryptococcus neoformans/gattii NOT DETECTED NOT DETECTED   CTX-M ESBL NOT DETECTED NOT DETECTED   Carbapenem resistance IMP NOT DETECTED NOT DETECTED   Carbapenem resistance KPC NOT DETECTED NOT DETECTED   Carbapenem resistance NDM NOT DETECTED NOT DETECTED   Carbapenem resist OXA 48 LIKE NOT DETECTED NOT DETECTED   Carbapenem resistance VIM NOT DETECTED NOT DETECTED   Alphia Moh, PharmD, BCPS, BCCP Clinical Pharmacist  Please check AMION for all [facility] Pharmacy phone numbers After 10:00 PM, call Main Pharmacy [phone]

## 2022-05-25 NOTE — Progress Notes (Signed)
Patient transported on bipap from 5W to 2M12 without complications. RN at bedside.

## 2022-05-25 NOTE — Progress Notes (Signed)
Patient's spouse couldnot be reached out.Called regarding patient's transfer to ICU at 1021

## 2022-05-25 NOTE — Telephone Encounter (Signed)
Patient's spouse called stating patient is currently in the hospital at [facility]. Cancelled appointments. Will call back to reschedule.

## 2022-05-25 NOTE — Progress Notes (Signed)
Pharmacy ICU Bowel Regimen Consult Note   Current Inpatient Medications for Bowel Management:  None  Assessment: Suzanne Dickson is a 77 y.o. year old female admitted on 05/24/2022. Constipation identified as non-opioid-induced constipation . Bowel regimen assessment completed by Aggie Cosier (nurse name) on 05/25/22 (date). LBM on unknown. Day 2 of admission with abdominal pain but her CT A/P showed stool.  [x]  Bowel sounds present  []  No abdominal tenderness >>Dr. Merrily Pew aware of abdominal tenderness and ok to continue protocol.  [x]  Passing gas   Plan: Start Miralax 17g BID + Docusate 100 BID.  MD contacted (if needed):Dr. Merrily Pew  Thank you for allowing pharmacy to participate in this patient's care.  Link Snuffer, PharmD, BCPS, BCCCP Clinical Pharmacist Please refer to [facility] for [facility] Pharmacy numbers 05/25/2022,2:50 PM

## 2022-05-25 NOTE — Progress Notes (Signed)
Trial of patient being switched from continuous biPaP to HFNC. Patient had rapid oxygen desaturation down to high 70's. Patient is now back on continuous biPaP. MD at bedside with new orders.

## 2022-05-25 NOTE — Progress Notes (Signed)
Agitated and tearful. Wants the bipap mask removed and to take a break from it. Contacted RT. Placed back on HFNC 8L. Alert and oriented *4 now.

## 2022-05-25 NOTE — Progress Notes (Addendum)
PROGRESS NOTE        PATIENT DETAILS Name: Suzanne Dickson Age: 77 y.o. Sex: female Date of Birth: [birth year] Admit Date: 05/24/2022 Admitting Physician Dewayne Shorter Levora Dredge, MD ZDG:[id number], Carolan Shiver, FNP  Brief Summary: Patient is a 77 y.o.  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  Significant events: 4/29>> admit to [facility]-sepsis-presumed pyelonephritis.  Post admission-developed SOB-placed on BiPAP. 4/30>> blood cultures positive for Proteus.  Failed liberation of BiPAP.  Significant studies: 4/29>> CT head: No acute intracranial abnormality 4/29>> CT abdomen/pelvis: Motion artifact-left mid kidney with stranding/enhancement. 4/30>> CXR: Increased interstitial opacities-pulm edema versus atypical infection.  Significant microbiology data: 4/29>> COVID PCR: Negative 4/29>> blood culture: 1/2-gram-negative rods  Procedures: None  Consults: None  Subjective: Attempted to liberate off BiPAP this morning-rapidly desaturated-very anxious-placed back on BiPAP.  Febrile to 102 F.  Objective: Vitals: Blood pressure (!) 194/79, pulse (!) 136, temperature (!) 102.5 F (39.2 C), temperature source Axillary, resp. rate (!) 22, height 5\' 2"  (1.575 m), weight 74 kg, SpO2 97 %.   Exam: Gen Exam: Very anxious-in mild chest-mildly tachypneic-back on BiPAP.   HEENT:atraumatic, normocephalic Chest: Diminished air entry-but otherwise clear CVS:S1S2 regular Abdomen:soft non tender, non distended Extremities:no edema Neurology: Non focal Skin: no rash  Pertinent Labs/Radiology:    Latest Ref Rng & Units 05/25/2022    5:53 AM 05/24/2022    1:40 PM 04/20/2022   11:02 AM  CBC  WBC 4.0 - 10.5 K/uL 18.6   16.0  12.6   Hemoglobin 12.0 - 15.0 g/dL 34.7  42.5  95.6   Hematocrit 36.0 - 46.0 % 33.6  36.6  35.3   Platelets 150 - 400 K/uL 111  117  151     Lab Results  Component Value Date   NA 140 05/25/2022   K 4.2 05/25/2022   CL 102 05/25/2022   CO2 24 05/25/2022      Assessment/Plan: Severe sepsis secondary to left pyelonephritis and gram-negative bacteremia Febrile-tachycardic-very anxious this morning.  Answering some questions appropriately-difficult to assess as patient back on BiPAP. Continue Rocephin-watch closely-given her overall frailty/advanced COPD-could deteriorate and require ICU level of care.  Acute on chronic hypoxic respiratory failure (on 6-8 L of oxygen at baseline) Developed respiratory distress last night and placed on BiPAP-failed to liberate off BiPAP this morning Back on BiPAP Suspect etiology could be possible mild pulmonary edema superimposed on advanced COPD (CXR suggestive of pulm edema) Lasix 40 mg IV x 1 Switch bronchodilators to nebulized route Will reassess in a few hours-if no improvement-May require transfer to the ICU for close monitoring.  Acute metabolic encephalopathy Seems to have improved-likely secondary to sepsis physiology CT head negative  Anemia Likely secondary to acute illness Follow CBC  Thrombocytopenia Due to sepsis physiology Mild Stable for routine observation/follow-up  COPD-chronic hypoxia-6-8 L of oxygen at baseline On daily prednisone-switch to IV steroids as patient on BiPAP Switch to nebulizers See above  HTN BP significantly elevated this morning-likely secondary to anxiety-as she failed BiPAP trial Lasix 40 mg IV 1 dose-see above Add as needed-IV hydralazine Resume amlodipine/metoprolol when off BiPAP.  CKD stage IIIb Close to  baseline-mildly elevated creatinine Watch closely.  OSA per spouse-noncompliant with CPAP at home  Stage Ia non-small cell cancer of the lung  Follows with Dr. Mohamed-currently  on observation-outpatient PET/CT planned  Palliative care History of advanced COPD-on 6-8 L of oxygen Spouse was very well aware that she is at risk of significant deterioration-given advanced lung disease-ongoing shortness of breath requiring BiPAP He requests that patient remain a full code-okay with mechanical ventilation/intubation if need be.  BMI: Estimated body mass index is 29.84 kg/m as calculated from the following:   Height as of this encounter: 5\' 2"  (1.575 m).   Weight as of this encounter: 74 kg.   Code status:   Code Status: Full Code   DVT Prophylaxis: enoxaparin (LOVENOX) injection 30 mg Start: 05/24/22 1830   Family Communication: Spouse at bedside   Disposition Plan: Status is: Inpatient Remains inpatient appropriate because: Severity of illness   Planned Discharge Destination:Home   Diet: Diet Order             Diet regular Room service appropriate? Yes; Fluid consistency: Thin  Diet effective now                     Antimicrobial agents: Anti-infectives (From admission, onward)    Start     Dose/Rate Route Frequency Ordered Stop   05/24/22 2000  cefTRIAXone (ROCEPHIN) 2 g in sodium chloride 0.9 % 100 mL IVPB        2 g 200 mL/hr over 30 Minutes Intravenous Every 24 hours 05/24/22 1624 05/31/22 1959   05/24/22 1445  vancomycin (VANCOREADY) IVPB 1500 mg/300 mL        1,500 mg 150 mL/hr over 120 Minutes Intravenous  Once 05/24/22 1421 05/24/22 2016   05/24/22 1400  ceFEPIme (MAXIPIME) 2 g in sodium chloride 0.9 % 100 mL IVPB        2 g 200 mL/hr over 30 Minutes Intravenous  Once 05/24/22 1356 05/24/22 1520   05/24/22 1400  metroNIDAZOLE (FLAGYL) IVPB 500 mg        500 mg 100 mL/hr over 60 Minutes Intravenous  Once 05/24/22 1356 05/24/22 1625        MEDICATIONS: Scheduled Meds:  amLODipine  5 mg Oral Daily   arformoterol  15 mcg Nebulization BID   aspirin EC  81 mg Oral Daily   atorvastatin  20 mg Oral QHS   budesonide (PULMICORT)  nebulizer solution  0.25 mg Nebulization BID   clopidogrel  75 mg Oral Daily   enoxaparin (LOVENOX) injection  30 mg Subcutaneous Q24H   furosemide  40 mg Intravenous Once   levalbuterol  0.63 mg Nebulization Q8H   And   ipratropium  0.5 mg Nebulization Q8H   lidocaine  1 patch Transdermal Q24H   LORazepam  0.5 mg Intravenous Once   metoprolol succinate  25 mg Oral Daily   pantoprazole  40 mg Oral QAC breakfast   predniSONE  10 mg Oral Q breakfast   revefenacin  175 mcg Nebulization Daily   sodium chloride flush  3 mL Intravenous Q12H   Continuous Infusions:  cefTRIAXone (ROCEPHIN)  IV Stopped (05/24/22 2055)   PRN Meds:.acetaminophen **OR** acetaminophen, levalbuterol, methocarbamol, polyethylene glycol   I have personally reviewed following labs and imaging studies  LABORATORY DATA: CBC: Recent Labs  Lab 05/24/22 1340 05/25/22 0553  WBC 16.0* 18.6*  NEUTROABS 14.1*  --   HGB 10.8* 10.3*  HCT 36.6 33.6*  MCV 104.0* 102.4*  PLT 117* 111*  Basic Metabolic Panel: Recent Labs  Lab 05/24/22 1340 05/25/22 0553  NA 136 140  K 3.7 4.2  CL 96* 102  CO2 26 24  GLUCOSE 153* 139*  BUN 23 17  CREATININE 1.47* 1.47*  CALCIUM 8.9 8.9    GFR: Estimated Creatinine Clearance: 30.7 mL/min (A) (by C-G formula based on SCr of 1.47 mg/dL (H)).  Liver Function Tests: Recent Labs  Lab 05/24/22 1340 05/25/22 0553  AST 39 34  ALT 36 30  ALKPHOS 64 65  BILITOT 0.8 0.7  PROT 6.6 6.3*  ALBUMIN 3.4* 3.0*   Recent Labs  Lab 05/24/22 1340  LIPASE 29   No results for input(s): "AMMONIA" in the last 168 hours.  Coagulation Profile: Recent Labs  Lab 05/25/22 0553  INR 1.5*    Cardiac Enzymes: No results for input(s): "CKTOTAL", "CKMB", "CKMBINDEX", "TROPONINI" in the last 168 hours.  BNP (last 3 results) No results for input(s): "PROBNP" in the last 8760 hours.  Lipid Profile: No results for input(s): "CHOL", "HDL", "LDLCALC", "TRIG", "CHOLHDL", "LDLDIRECT" in  the last 72 hours.  Thyroid Function Tests: No results for input(s): "TSH", "T4TOTAL", "FREET4", "T3FREE", "THYROIDAB" in the last 72 hours.  Anemia Panel: No results for input(s): "VITAMINB12", "FOLATE", "FERRITIN", "TIBC", "IRON", "RETICCTPCT" in the last 72 hours.  Urine analysis:    Component Value Date/Time   COLORURINE STRAW (A) 05/24/2022 1531   APPEARANCEUR CLEAR 05/24/2022 1531   LABSPEC 1.013 05/24/2022 1531   PHURINE 7.0 05/24/2022 1531   GLUCOSEU >=500 (A) 05/24/2022 1531   HGBUR SMALL (A) 05/24/2022 1531   BILIRUBINUR NEGATIVE 05/24/2022 1531   KETONESUR NEGATIVE 05/24/2022 1531   PROTEINUR NEGATIVE 05/24/2022 1531   UROBILINOGEN 0.2 09/02/2014 2138   NITRITE POSITIVE (A) 05/24/2022 1531   LEUKOCYTESUR SMALL (A) 05/24/2022 1531    Sepsis Labs: Lactic Acid, Venous    Component Value Date/Time   LATICACIDVEN 2.6 (HH) 05/24/2022 1540    MICROBIOLOGY: Recent Results (from the past 240 hour(s))  Blood culture (routine x 2)     Status: None (Preliminary result)   Collection Time: 05/24/22  1:40 PM   Specimen: BLOOD RIGHT HAND  Result Value Ref Range Status   Specimen Description BLOOD RIGHT HAND  Final   Special Requests   Final    BOTTLES DRAWN AEROBIC AND ANAEROBIC Blood Culture results may not be optimal due to an excessive volume of blood received in culture bottles   Culture  Setup Time   Final    GRAM NEGATIVE RODS ANAEROBIC BOTTLE ONLY CRITICAL RESULT CALLED TO, READ BACK BY AND VERIFIED WITH: PHARMD JESSICA Judie Petit [id number] FCP Performed at [facility], [address], [place], [place] [address]    Culture GRAM NEGATIVE RODS  Final   Report Status PENDING  Incomplete  Blood Culture ID Panel (Reflexed)     Status: Abnormal   Collection Time: 05/24/22  1:40 PM  Result Value Ref Range Status   Enterococcus faecalis NOT DETECTED NOT DETECTED Final   Enterococcus Faecium NOT DETECTED NOT DETECTED Final   Listeria monocytogenes NOT DETECTED NOT  DETECTED Final   Staphylococcus species NOT DETECTED NOT DETECTED Final   Staphylococcus aureus (BCID) NOT DETECTED NOT DETECTED Final   Staphylococcus epidermidis NOT DETECTED NOT DETECTED Final   Staphylococcus lugdunensis NOT DETECTED NOT DETECTED Final   Streptococcus species NOT DETECTED NOT DETECTED Final   Streptococcus agalactiae NOT DETECTED NOT DETECTED Final   Streptococcus pneumoniae NOT DETECTED NOT DETECTED Final   Streptococcus  pyogenes NOT DETECTED NOT DETECTED Final   A.calcoaceticus-baumannii NOT DETECTED NOT DETECTED Final   Bacteroides fragilis NOT DETECTED NOT DETECTED Final   Enterobacterales DETECTED (A) NOT DETECTED Final    Comment: Enterobacterales represent a large order of gram negative bacteria, not a single organism. CRITICAL RESULT CALLED TO, READ BACK BY AND VERIFIED WITH: PHARMD JESSICA M [id number] FCP    Enterobacter cloacae complex NOT DETECTED NOT DETECTED Final   Escherichia coli NOT DETECTED NOT DETECTED Final   Klebsiella aerogenes NOT DETECTED NOT DETECTED Final   Klebsiella oxytoca NOT DETECTED NOT DETECTED Final   Klebsiella pneumoniae NOT DETECTED NOT DETECTED Final   Proteus species DETECTED (A) NOT DETECTED Final    Comment: CRITICAL RESULT CALLED TO, READ BACK BY AND VERIFIED WITH: PHARMD JESSICA M [id number] FCP    Salmonella species NOT DETECTED NOT DETECTED Final   Serratia marcescens NOT DETECTED NOT DETECTED Final   Haemophilus influenzae NOT DETECTED NOT DETECTED Final   Neisseria meningitidis NOT DETECTED NOT DETECTED Final   Pseudomonas aeruginosa NOT DETECTED NOT DETECTED Final   Stenotrophomonas maltophilia NOT DETECTED NOT DETECTED Final   Candida albicans NOT DETECTED NOT DETECTED Final   Candida auris NOT DETECTED NOT DETECTED Final   Candida glabrata NOT DETECTED NOT DETECTED Final   Candida krusei NOT DETECTED NOT DETECTED Final   Candida parapsilosis NOT DETECTED NOT DETECTED Final   Candida tropicalis NOT DETECTED  NOT DETECTED Final   Cryptococcus neoformans/gattii NOT DETECTED NOT DETECTED Final   CTX-M ESBL NOT DETECTED NOT DETECTED Final   Carbapenem resistance IMP NOT DETECTED NOT DETECTED Final   Carbapenem resistance KPC NOT DETECTED NOT DETECTED Final   Carbapenem resistance NDM NOT DETECTED NOT DETECTED Final   Carbapenem resist OXA 48 LIKE NOT DETECTED NOT DETECTED Final   Carbapenem resistance VIM NOT DETECTED NOT DETECTED Final    Comment: Performed at [facility], [address] [place], [place] [address]  SARS Coronavirus 2 by RT PCR (hospital order, performed in [facility] hospital lab) *cepheid single result test*     Status: None   Collection Time: 05/24/22  1:46 PM   Specimen: Nasal Swab  Result Value Ref Range Status   SARS Coronavirus 2 by RT PCR NEGATIVE NEGATIVE Final    Comment: Performed at [facility], [address] [place], [place] [address]  Blood culture (routine x 2)     Status: None (Preliminary result)   Collection Time: 05/24/22  3:49 PM   Specimen: BLOOD  Result Value Ref Range Status   Specimen Description BLOOD BLOOD LEFT FOREARM  Final   Special Requests   Final    BOTTLES DRAWN AEROBIC AND ANAEROBIC Blood Culture results may not be optimal due to an inadequate volume of blood received in culture bottles   Culture   Final    NO GROWTH < 24 HOURS Performed at [facility], [address] [place], [place] [address]    Report Status PENDING  Incomplete    RADIOLOGY STUDIES/RESULTS: DG Chest 1 View  Result Date: 05/25/2022 CLINICAL DATA:  10026 Shortness of breath 10026 EXAM: CHEST  1 VIEW COMPARISON:  May 24, 2022, September 17, 2020 FINDINGS: The cardiomediastinal silhouette is unchanged in contour.Unchanged irregular LEFT perihilar contour with clips consistent with history of malignancy status post radiation. No pleural effusion. No pneumothorax. Increased bibasilar predominant interstitial opacities. Atherosclerotic calcifications.  IMPRESSION: Increased bibasilar predominant interstitial opacities. Differential considerations include pulmonary edema versus  atypical infection. Electronically Signed   By: Meda Klinefelter M.D.   On: 05/25/2022 08:49   CT ABDOMEN PELVIS W CONTRAST  Result Date: 05/24/2022 CLINICAL DATA:  Status post XRT left-sided lung cancer. Right lower quadrant pain, vomiting and sepsis. EXAM: CT ABDOMEN AND PELVIS WITH CONTRAST TECHNIQUE: Multidetector CT imaging of the abdomen and pelvis was performed using the standard protocol following bolus administration of intravenous contrast. RADIATION DOSE REDUCTION: This exam was performed according to the departmental dose-optimization program which includes automated exposure control, adjustment of the mA and/or kV according to patient size and/or use of iterative reconstruction technique. CONTRAST:  60mL OMNIPAQUE IOHEXOL 350 MG/ML SOLN COMPARISON:  PET-CT 05/20/2022 FINDINGS: Lower chest: Breathing motion at the lung bases. Mild scar or atelectasis. No pleural effusion. Coronary artery calcifications are seen. Coronary artery calcifications are seen. Small hiatal hernia. Hepatobiliary: Fatty liver infiltration. No obvious space-occupying liver lesion. Patent portal vein. Gallbladder is surgically absent Pancreas: Unremarkable. No pancreatic ductal dilatation or surrounding inflammatory changes. Spleen: Normal in size without focal abnormality. Adrenals/Urinary Tract: Adrenal glands are preserved. Multiple bilateral renal cysts are identified. Largest is seen upper pole on the right measuring 5.8 cm in diameter and Hounsfield unit of 10. Lower pole focus on the left as another example measures diameter 3.8 cm and has Hounsfield units of 7. Few other smaller foci identified. These are Bosniak 1 and 2 lesions. No specific imaging follow-up. However there is an ill-defined area in the anterior aspect of the left kidney best seen on delayed series 13, image 14 measuring 19 mm.  Hounsfield units of 105 on delayed and 99 on portal venous phase. Margins are somewhat difficult to define as there is motion throughout the examination. There is some perinephric stranding. Based on appearance this could represent edema of the parenchyma and an area of pyelonephritis with the stranding. Please correlate with clinical presentation. Preserved contours of the urinary bladder. Stomach/Bowel: Sigmoid colon diverticula. Scattered colonic stool. Normal appendix. Stomach and small bowel are nondilated. Vascular/Lymphatic: Extensive vascular calcifications identified along the aorta and branch vessels with areas of potential stenosis including the iliac vessels, SMA and renal arteries. Please correlate with the history. No specific abnormal lymph node enlargement present in the abdomen and pelvis. Reproductive: Uterus and bilateral adnexa are unremarkable. Other: No abdominal wall hernia or abnormality. No abdominopelvic ascites. Musculoskeletal: Trace anterolisthesis of L3 on L4. Multilevel compression deformities along the spine with the margins are sclerotic. Favor a more chronic process. IMPRESSION: Study limited by motion throughout the examination. There is some ill-defined area enhancement along the left mid kidney with stranding. Possibilities would include an area of pyelonephritis. Please correlate with history. Otherwise would recommend follow up evaluation when the patient is more able to tolerate the study without motion. Separate bilateral benign-appearing renal cysts. No bowel obstruction.  Colonic diverticulosis. Multilevel compression deformities along the spine but the margins are somewhat sclerotic. Favor more of a chronic process. Please correlate with the history. Extensive vascular calcifications including the aorta and branch vessels. Please correlate with any particular symptoms. Electronically Signed   By: Karen Kays M.D.   On: 05/24/2022 15:34   CT Head Wo Contrast  Result  Date: 05/24/2022 CLINICAL DATA:  Altered mental status EXAM: CT HEAD WITHOUT CONTRAST TECHNIQUE: Contiguous axial images were obtained from the base of the skull through the vertex without intravenous contrast. RADIATION DOSE REDUCTION: This exam was performed according to the departmental dose-optimization program which includes automated exposure control, adjustment of the mA  and/or kV according to patient size and/or use of iterative reconstruction technique. COMPARISON:  Paranasal sinus CT 11/30/2017 and CT head 03/28/2005 FINDINGS: Brain: No intracranial hemorrhage, mass effect, or evidence of acute infarct. No hydrocephalus. No extra-axial fluid collection. Generalized cerebral atrophy. Ill-defined hypoattenuation within the cerebral white matter is nonspecific but consistent with chronic small vessel ischemic disease. Physiologic basal ganglia calcification. Vascular: No hyperdense vessel. Intracranial arterial calcification. Skull: No fracture or focal lesion. Sinuses/Orbits: No acute finding. Other: None. IMPRESSION: 1. No evidence of acute intracranial abnormality. 2. Chronic small vessel ischemic disease and cerebral atrophy. Electronically Signed   By: Minerva Fester M.D.   On: 05/24/2022 15:25   DG Chest Portable 1 View  Result Date: 05/24/2022 CLINICAL DATA:  Altered mental status.  Sepsis workup. EXAM: PORTABLE CHEST 1 VIEW COMPARISON:  CT chest 04/20/2022 FINDINGS: The cardiomediastinal silhouette is stable. Bandlike opacity in the left perihilar region with associated architectural distortion is similar to the prior study. The lingular nodule seen on the recent PET-CT is not well appreciated on the current study. There is no new or worsening focal airspace disease. There is no pulmonary edema. There is no pleural effusion or pneumothorax There is no acute osseous abnormality. IMPRESSION: No radiographic evidence of acute cardiopulmonary process. Electronically Signed   By: Lesia Hausen M.D.    On: 05/24/2022 14:15     LOS: 1 day   Jeoffrey Massed, MD  [facility]    To contact the attending provider between 7A-7P or the covering provider during after hours 7P-7A, please log into the web site [email or web address] and access using universal [place] password for that web site. If you do not have the password, please call the hospital operator.  05/25/2022, 8:52 AM

## 2022-05-25 NOTE — Progress Notes (Signed)
eLink Physician-Brief Progress Note Patient Name: Suzanne Dickson DOB: 1945-06-12 MRN: [id number]   Date of Service  05/25/2022  HPI/Events of Note  Patient extremely lethargic, not rousing with vigorous shaking. History of end stage COPD with hypercapnic respiratory failure, no recent sedatives, blood sugar normal.  eICU Interventions  Stat ABG, Trial of BIPAP, Ground crew asked to come to the room because patient is at very high risk of requiring intubation.        Migdalia Dk 05/25/2022, 8:26 PM

## 2022-05-25 NOTE — Progress Notes (Signed)
Notified Carney Bern, RN w/ [facility] about decreased responsiveness to verbal and tactile stimuli. Unable to keep eyes opened and respond to orientation questions. Only able to respond to say, "hospital," when asked where she is at right now then closed eyes again. RR 20 but appears labored w/ accessory muscle use. RT notified for Stat ABG and removed HFNC 8L and placed on bipap . She became alert and oriented and more responsive once RT was getting ABG. Ground team present to assess. Upset about having bipap mask on and does not want to wear it, but education provided as to why she needs to wear it for now.  Continuing to monitor. VS WNL . See ABG results.

## 2022-05-26 ENCOUNTER — Inpatient Hospital Stay (HOSPITAL_COMMUNITY): Payer: Medicare PPO

## 2022-05-26 ENCOUNTER — Inpatient Hospital Stay
Admission: RE | Admit: 2022-05-26 | Discharge: 2022-05-26 | Disposition: A | Discharge status: Home / Self Care | Payer: Medicare PPO | Source: Ambulatory Visit | Attending: Physician Assistant | Admitting: Physician Assistant

## 2022-05-26 DIAGNOSIS — I5033 Acute on chronic diastolic (congestive) heart failure: Secondary | ICD-10-CM

## 2022-05-26 DIAGNOSIS — I5031 Acute diastolic (congestive) heart failure: Secondary | ICD-10-CM

## 2022-05-26 LAB — ECHOCARDIOGRAM COMPLETE
Area-P 1/2: 6.65 cm2
Calc EF: 63.9 %
Height: 62 in
P 1/2 time: 220 msec
S' Lateral: 2.3 cm
Single Plane A2C EF: 64.8 %
Single Plane A4C EF: 62.1 %
Weight: 2610.25 oz

## 2022-05-26 LAB — CBC
HCT: 33.7 % — ABNORMAL LOW (ref 36.0–46.0)
Hemoglobin: 10.4 g/dL — ABNORMAL LOW (ref 12.0–15.0)
MCH: 31.1 pg (ref 26.0–34.0)
MCHC: 30.9 g/dL (ref 30.0–36.0)
MCV: 100.9 fL — ABNORMAL HIGH (ref 80.0–100.0)
Platelets: 130 10*3/uL — ABNORMAL LOW (ref 150–400)
RBC: 3.34 MIL/uL — ABNORMAL LOW (ref 3.87–5.11)
RDW: 11.9 % (ref 11.5–15.5)
WBC: 16.3 10*3/uL — ABNORMAL HIGH (ref 4.0–10.5)
nRBC: 0 % (ref 0.0–0.2)

## 2022-05-26 LAB — COMPREHENSIVE METABOLIC PANEL
ALT: 24 U/L (ref 0–44)
AST: 24 U/L (ref 15–41)
Albumin: 2.8 g/dL — ABNORMAL LOW (ref 3.5–5.0)
Alkaline Phosphatase: 60 U/L (ref 38–126)
Anion gap: 15 (ref 5–15)
BUN: 29 mg/dL — ABNORMAL HIGH (ref 8–23)
CO2: 23 mmol/L (ref 22–32)
Calcium: 8.6 mg/dL — ABNORMAL LOW (ref 8.9–10.3)
Chloride: 98 mmol/L (ref 98–111)
Creatinine, Ser: 1.53 mg/dL — ABNORMAL HIGH (ref 0.44–1.00)
GFR, Estimated: 35 mL/min — ABNORMAL LOW (ref >60)
Glucose, Bld: 189 mg/dL — ABNORMAL HIGH (ref 70–99)
Potassium: 3.6 mmol/L (ref 3.5–5.1)
Sodium: 136 mmol/L (ref 135–145)
Total Bilirubin: 0.8 mg/dL (ref 0.3–1.2)
Total Protein: 6.2 g/dL — ABNORMAL LOW (ref 6.5–8.1)

## 2022-05-26 LAB — CULTURE, BLOOD (ROUTINE X 2)

## 2022-05-26 LAB — GLUCOSE, CAPILLARY
Glucose-Capillary: 187 mg/dL — ABNORMAL HIGH (ref 70–99)
Glucose-Capillary: 161 mg/dL — ABNORMAL HIGH (ref 70–99)
Glucose-Capillary: 164 mg/dL — ABNORMAL HIGH (ref 70–99)
Glucose-Capillary: 225 mg/dL — ABNORMAL HIGH (ref 70–99)
Glucose-Capillary: 144 mg/dL — ABNORMAL HIGH (ref 70–99)
Glucose-Capillary: 122 mg/dL — ABNORMAL HIGH (ref 70–99)

## 2022-05-26 LAB — PROCALCITONIN: Procalcitonin: 28.73 ng/mL

## 2022-05-26 MED ORDER — FUROSEMIDE 10 MG/ML IJ SOLN
40.0000 mg | Freq: Once | INTRAMUSCULAR | Status: AC
  Administered 2022-05-26: 40 mg via INTRAVENOUS
  Filled 2022-05-26: qty 4

## 2022-05-26 MED ORDER — BUSPIRONE HCL 5 MG PO TABS
7.5000 mg | ORAL_TABLET | Freq: Three times a day (TID) | ORAL | Status: DC
  Administered 2022-05-26 – 2022-05-31 (×15): 7.5 mg via ORAL
  Filled 2022-05-26: qty 2
  Filled 2022-05-26: qty 1
  Filled 2022-05-26: qty 2
  Filled 2022-05-26: qty 1
  Filled 2022-05-26 (×4): qty 2
  Filled 2022-05-26: qty 1
  Filled 2022-05-26 (×4): qty 2
  Filled 2022-05-26: qty 1
  Filled 2022-05-26: qty 2

## 2022-05-26 MED ORDER — MELATONIN 3 MG PO TABS
3.0000 mg | ORAL_TABLET | Freq: Once | ORAL | Status: AC
  Administered 2022-05-26: 3 mg via ORAL
  Filled 2022-05-26: qty 1

## 2022-05-26 MED ORDER — POTASSIUM CHLORIDE 10 MEQ/100ML IV SOLN
10.0000 meq | INTRAVENOUS | Status: AC
  Administered 2022-05-26 (×4): 10 meq via INTRAVENOUS
  Filled 2022-05-26 (×4): qty 100

## 2022-05-26 MED ORDER — INSULIN ASPART 100 UNIT/ML IJ SOLN
0.0000 [IU] | Freq: Three times a day (TID) | INTRAMUSCULAR | Status: DC
  Administered 2022-05-26: 5 [IU] via SUBCUTANEOUS
  Administered 2022-05-27: 3 [IU] via SUBCUTANEOUS
  Administered 2022-05-28: 5 [IU] via SUBCUTANEOUS
  Administered 2022-05-29: 3 [IU] via SUBCUTANEOUS
  Administered 2022-05-29: 2 [IU] via SUBCUTANEOUS
  Administered 2022-05-29: 3 [IU] via SUBCUTANEOUS
  Administered 2022-05-30: 8 [IU] via SUBCUTANEOUS
  Administered 2022-05-30: 3 [IU] via SUBCUTANEOUS
  Administered 2022-05-30: 8 [IU] via SUBCUTANEOUS
  Administered 2022-05-31: 3 [IU] via SUBCUTANEOUS

## 2022-05-26 MED ORDER — SENNA 8.6 MG PO TABS: 1.0000 | ORAL_TABLET | Freq: Every day | ORAL | Status: DC

## 2022-05-26 MED ORDER — GUAIFENESIN 100 MG/5ML PO LIQD
5.0000 mL | ORAL | Status: DC | PRN
  Administered 2022-05-26 – 2022-05-30 (×6): 5 mL via ORAL
  Filled 2022-05-26 (×6): qty 5

## 2022-05-26 NOTE — Progress Notes (Signed)
[facility] ADULT ICU REPLACEMENT PROTOCOL   The patient does apply for the [facility] Adult ICU Electrolyte Replacment Protocol based on the criteria listed below:   1.Exclusion criteria: TCTS, ECMO, Dialysis, and Myasthenia Gravis patients 2. Is GFR >/= 30 ml/min? Yes.    Patient's GFR today is 35 3. Is SCr </= 2? Yes.   Patient's SCr is 1.53 mg/dL 4. Did SCr increase >/= 0.5 in 24 hours? No. 5.Pt's weight >40kg  Yes.   6. Abnormal electrolyte(s): potassium 3.5  7. Electrolytes replaced per protocol 8.  Call MD STAT for K+ </= 2.5, Phos </= 1, or Mag </= 1 Physician:  protocol  Melvern Banker 05/26/2022 5:06 AM

## 2022-05-26 NOTE — Progress Notes (Signed)
eLink Physician-Brief Progress Note Patient Name: Suzanne Dickson DOB: 12-Jul-1945 MRN: [id number]   Date of Service  05/26/2022  HPI/Events of Note  Suzanne Dickson has anxiety and has not been able to sleep well. Says not sleeping is making her worse. Taking her Buspar and will go on BIPAP overnight. Does have tenuous resp status so would prefer to avoid a benzo  eICU Interventions  Melatonin for insomnia. Hopefully getting some sleep will help.      Intervention Category Major Interventions: Respiratory failure - evaluation and management  Suzanne Dickson 05/26/2022, 8:44 PM

## 2022-05-26 NOTE — Progress Notes (Signed)
Echocardiogram 2D Echocardiogram has been performed.  Toni Amend 05/26/2022, 8:38 AM

## 2022-05-26 NOTE — Progress Notes (Signed)
Pharmacy ICU Bowel Regimen Consult Note   Current Inpatient Medications for Bowel Management:  Docusate 100 mg BID Miralax 17 g BID  Assessment: Suzanne Dickson is a 77 y.o. year old female admitted on 05/24/2022. Constipation identified as non-opioid-induced constipation . Bowel regimen assessment completed by Suzanne Dickson on 5/1. LBM on unknown. Day 3 of admission. Per pt report, feels BM is imminent  [x]  Bowel sounds present  []  No abdominal tenderness >>Dr. Merrily Pew aware of abdominal tenderness and ok to continue protocol.  [x]  Passing gas   Plan: Continue miralax, docusate  Add senna qHS to be given if no BM by tonight (5/1) MD contacted (if needed):Dr. Merrily Pew  Thank you for allowing pharmacy to participate in this patient's care.  Calton Dach, PharmD Clinical Pharmacist 05/26/2022 11:38 AM

## 2022-05-26 NOTE — Progress Notes (Signed)
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  She been on noninvasive overnight and is not improving.  She is now in acute respiratory distress heart rate has been 140 respiratory rate is 42 and she is obviously tiring out.  Due to her complex medical history as documented below she be transferred to intensive care unit and most likely will require intubation unless her CODE STATUS is changed.  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   "I'm in remission" (04/05/2017)   Adenocarcinoma, lung (HCC)    Anxiety    Arthritis    "legs, elbows" (04/05/2017)   Bronchitis, chronic (HCC)    "haven't had it in awhile" (04/05/2017)   CHF (congestive heart failure) (HCC)    COPD (chronic obstructive pulmonary disease) (HCC)    Coronary artery disease    Daily headache    "recently" (04/05/2017)   Depression    Dyspnea    with exertion   GERD (gastroesophageal reflux disease)    History of blood transfusion 1950s   History of radiation therapy 05/04/16 - 05/12/16   Left lung treated to 54 Gy with 3 fx of 18 Gy   Hypertension    On home oxygen therapy    "2L; 24/7" (04/05/2017)   OSA (obstructive sleep apnea)    OSA on CPAP    Pneumonia 10/2015   "at least 3 times" (04/05/2017)   Pulmonary hypertension (HCC)      Significant Hospital Events: Including procedures, antibiotic start and stop dates in addition to other pertinent events   Transfer to ICU 4/30  Interim History / Subjective:  Today her breathing feels slightly improved but she still has shortness of  breath after eating.  Her breathing is not back to her baseline.  Overnight she required bedside evaluation due to encephalopathy that was not due to hypercapnia.  Objective   Blood pressure (!) 147/73, pulse (!) 123, temperature 97.8 F (36.6 C), temperature source Oral, resp. rate 18, height 5\' 2"  (1.575 m), weight 74 kg, SpO2 98 %.    FiO2 (%):  [40 %] 40 %   Intake/Output Summary (Last 24 hours) at 05/26/2022 1205 Last data filed at 05/26/2022 1000 Gross per 24 hour  Intake 783.1 ml  Output 1700 ml  Net -916.9 ml    Filed Weights   05/24/22 1335  Weight: 74 kg    Examination: General: Chronically ill-appearing woman sitting in the chair no acute distress HENT: [place]/AT, eyes anicteric Lungs: tachypnea, conversational dyspnea, no accessory muscle use on 8 L, no wheezing Cardiovascular: S1-S2, tachycardic, regular rhythm  abdomen: Obese, soft, nontender Extremities: Minimal edema Neuro: Awake and alert, answering questions appropriately, moving all extremities   BUN 29 Cr 1.53 WBC 16.3 H/H 10.4/33.7 Platelets 130  Echo: LVEF 65-70%, normal diastolic function. Hyperdynamic RV, elevated PASP. Mild MR. Mild to mod TR. IVC normal with normal variability.  CXR personally reviewed> RLL opacity, increased opacification bilateral bases but no silhouetting  Resolved Hospital Problem list     Assessment & Plan:  Acute on chronic hypoxic respiratory failure  requiring BiPAP, on 6-8 L of home oxygen.   COPD not acutely exacerbated.  Most of her decompensation is related to increased metabolic demand from sepsis and limited pulmonary reserve. - Continue bronchodilators - Supplemental oxygen as able to maintain SpO2 greater than 88%. - BiPAP as needed - We had a long discussion about whether or not she would want intubation.  My concern is with such severe baseline lung disease she would be unlikely to be able to wean from mechanical ventilation.  We discussed at that point we would  have to determine if she would want tracheostomy permanently or comfort care.  She would not want to go on mechanical ventilation, but she does not want to make this decision right now.  I encouraged her to speak with her husband.  History of lung cancer adenocarcinoma treated with radiation; concern for recurrence - Outpatient follow-up with Dr. Shirline Frees  OSA -BiPAP nocturnally  History of coronary artery disease with PCI and acute on chronic HFpEF HTN -Diuresis -Dual antiplatelet therapy, statin -Continue PTA amlodipine  GERD -Continue PPI   Morbid obesity -Long-term recommend modest weight loss  Anxiety - Resume home BuSpar  We had a lengthy discussion regarding goals of care.  See above.  It would be ideal to speak with her and her husband at some point to clearly define if she would want resuscitation in the event of cardiopulmonary arrest or if she would want elective intubation in the event of progressive respiratory failure.  Due to ongoing severe symptoms will continue to monitor her in the ICU today.  Best Practice (right click and "Reselect all SmartList Selections" daily)   Diet/type: Regular consistency (see orders) DVT prophylaxis: LMWH GI prophylaxis: PPI Lines: N/A Foley:  N/A Code Status:  full code Last date of multidisciplinary goals of care discussion [continue full aggressive care-5/1]  Labs   CBC: Recent Labs  Lab 05/24/22 1340 05/25/22 0553 05/25/22 2026 05/26/22 0305  WBC 16.0* 18.6*  --  16.3*  NEUTROABS 14.1*  --   --   --   HGB 10.8* 10.3* 10.5* 10.4*  HCT 36.6 33.6* 31.0* 33.7*  MCV 104.0* 102.4*  --  100.9*  PLT 117* 111*  --  130*     Basic Metabolic Panel: Recent Labs  Lab 05/24/22 1340 05/25/22 0553 05/25/22 2026 05/26/22 0305  NA 136 140 139 136  K 3.7 4.2 3.7 3.6  CL 96* 102  --  98  CO2 26 24  --  23  GLUCOSE 153* 139*  --  189*  BUN 23 17  --  29*  CREATININE 1.47* 1.47*  --  1.53*  CALCIUM 8.9 8.9  --  8.6*     GFR: Estimated Creatinine Clearance: 29.5 mL/min (A) (by C-G formula based on SCr of 1.53 mg/dL (H)). Recent Labs  Lab 05/24/22 1340 05/24/22 1540 05/24/22 1549 05/25/22 0553 05/26/22 0305  PROCALCITON  --   --  22.47 31.64 28.73  WBC 16.0*  --   --  18.6* 16.3*  LATICACIDVEN 2.0* 2.6*  --   --   --      Liver Function Tests: Recent Labs  Lab 05/24/22 1340 05/25/22 0553 05/26/22 0305  AST 39 34 24  ALT 36 30 24  ALKPHOS 64 65 60  BILITOT 0.8 0.7 0.8  PROT 6.6 6.3* 6.2*  ALBUMIN 3.4* 3.0* 2.8*    Recent Labs  Lab 05/24/22 1340  LIPASE 29    No results for input(s): "AMMONIA" in the last  168 hours.  ABG    Component Value Date/Time   PHART 7.403 05/25/2022 2026   PCO2ART 42.4 05/25/2022 2026   PO2ART 96 05/25/2022 2026   HCO3 26.5 05/25/2022 2026   TCO2 28 05/25/2022 2026   ACIDBASEDEF 0.1 05/24/2022 2201   O2SAT 98 05/25/2022 2026      Critical care time:       Steffanie Dunn, DO 05/26/22 12:05 PM [facility]  For contact information, see Amion. If no response to pager, please call PCCM consult pager. After hours, 7PM- 7AM, please call Elink.

## 2022-05-27 LAB — GLUCOSE, CAPILLARY
Glucose-Capillary: 119 mg/dL — ABNORMAL HIGH (ref 70–99)
Glucose-Capillary: 120 mg/dL — ABNORMAL HIGH (ref 70–99)
Glucose-Capillary: 173 mg/dL — ABNORMAL HIGH (ref 70–99)
Glucose-Capillary: 210 mg/dL — ABNORMAL HIGH (ref 70–99)

## 2022-05-27 LAB — CULTURE, BLOOD (ROUTINE X 2)

## 2022-05-27 NOTE — Plan of Care (Signed)

## 2022-05-27 NOTE — Progress Notes (Addendum)
Pt removed Bi-pap/C-pap states too much pressure on her abd. Resp updated. Pt back on O2. SRP, RN

## 2022-05-27 NOTE — Progress Notes (Signed)
Patient stated that she did not want to continue to wear the BiPAP.  Removed the BiPAP and placed patient on 8 L O2.  Made RN aware

## 2022-05-27 NOTE — Progress Notes (Signed)
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  She been on noninvasive overnight and is not improving.  She is now in acute respiratory distress heart rate has been 140 respiratory rate is 42 and she is obviously tiring out.  Due to her complex medical history as documented below she be transferred to intensive care unit and most likely will require intubation unless her CODE STATUS is changed.  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   "I'm in remission" (04/05/2017)   Adenocarcinoma, lung (HCC)    Anxiety    Arthritis    "legs, elbows" (04/05/2017)   Bronchitis, chronic (HCC)    "haven't had it in awhile" (04/05/2017)   CHF (congestive heart failure) (HCC)    COPD (chronic obstructive pulmonary disease) (HCC)    Coronary artery disease    Daily headache    "recently" (04/05/2017)   Depression    Dyspnea    with exertion   GERD (gastroesophageal reflux disease)    History of blood transfusion 1950s   History of radiation therapy 05/04/16 - 05/12/16   Left lung treated to 54 Gy with 3 fx of 18 Gy   Hypertension    On home oxygen therapy    "2L; 24/7" (04/05/2017)   OSA (obstructive sleep apnea)    OSA on CPAP    Pneumonia 10/2015   "at least 3 times" (04/05/2017)   Pulmonary hypertension (HCC)      Significant Hospital Events: Including procedures, antibiotic start and stop dates in addition to other pertinent events   Transfer to ICU 4/30, with worsening delirium/encephalopathy secondary to sepsis and associated increased work of breathing 5/1 echo  LVEF 65-70%, normal diastolic function. Hyperdynamic RV, elevated PASP. Mild MR. Mild to mod TR. IVC normal with normal variability. Breathing a little better but not at baseline and SOB after eating. Discussing GOC. Pt reported to team does not want vent/trach etc..but wanted to d/w family before making formal advanced directives.  5/2 ready for transfer to out of the intensive care palliative care consultation requested for goals of care discussion with her and her husband  Interim History / Subjective:  Feels almost at baseline still gets short of breath with exertion  Objective   Blood pressure 135/61, pulse (Abnormal) 111, temperature 98 F (36.7 C), temperature source Oral, resp. rate (Abnormal) 29, height 5\' 2"  (1.575 m), weight 74 kg, SpO2 92 %.    FiO2 (%):  [40 %] 40 % PEEP:  [8 cmH20] 8 cmH20 Pressure Support:  [8 cmH20] 8 cmH20   Intake/Output Summary (Last 24 hours) at 05/27/2022 1126 Last data filed at 05/27/2022 0800 Gross per 24 hour  Intake 427.48 ml  Output 1300 ml  Net -872.52 ml   Filed Weights   05/24/22 1335  Weight: 74 kg    Examination:  General this is a chronically ill 77 year old female patient currently sitting upright at the side of the bed and in no acute distress HEENT normocephalic atraumatic no jugular venous distention mucous membranes are moist Pulmonary: Diminished bases currently no accessory use  tolerating casual conversation without dyspnea currently on her 6 L via nasal cannula which is baseline Cardiac: Regular rate and rhythm Abdomen: Soft nontender Extremities: Warm dry brisk capillary refill Neuro: Awake and oriented.  Resolved Hospital Problem list   Sepsis resolved Acute metabolic encephalopathy secondary to sepsis Assessment & Plan:  Acute on chronic hypoxic respiratory failure requiring BiPAP, on 6-8 L of home oxygen.   COPD not acutely exacerbated.  OSA Most of her decompensation was secondary to acute sepsis and metabolic  demands superimposed on her underlying end-stage lung disease.  She has a CPAP device at home, however she might benefit from nocturnal ventilation at this point given her deconditioning.  She is currently stable for transfer out of the intensive care Plan Continue current triple nebulized therapy with Roxy Manns, and Pulmicort, she will transition back to her regular combination bronchodilator at discharge Continue supplemental oxygen weaning for saturations greater than 88%, baseline 6 L. Change her to at bedtime CPAP for now, as she has at baseline pulmonary status will get a.m. arterial blood gas to see if she qualifies for NIPPV.  She currently has a CPAP at home Will continue to discuss goals of care.  Will ask palliative care to assist with this discussion.  She informs that she does not want mechanical ventilation or trach but her husband is not at the same place she will need ongoing support with this discussion  Proteus Mirabilis bacteremia; urinary tract source (pansens org) -sepsis resolved. WBC & PCT slowly trending down Plan Day 4 of 7 CTX  History of lung cancer adenocarcinoma treated with radiation; concern for recurrence Plan Outpatient follow-up with Dr. Shirline Frees   History of coronary artery disease with PCI and acute on chronic HFpEF HTN -Diuresis -Dual antiplatelet therapy, statin -Continue PTA amlodipine  GERD Plan Continue PPI  Morbid obesity -Long-term recommend modest weight loss Plan No acute interventions  Anxiety Plan BuSpar   Best Practice (right click and "Reselect all SmartList Selections" daily)   Diet/type: Regular consistency (see orders) DVT prophylaxis: LMWH GI prophylaxis: PPI Lines: N/A Foley:  N/A Code Status:  full code Last date of multidisciplinary goals of care discussion [continue full aggressive care-5/1]    Critical care time: NA      Simonne Martinet ACNP-BC [facility] Pulmonary/Critical Care Pager # [phone] OR #  [phone] if no answer

## 2022-05-27 NOTE — Evaluation (Signed)
Physical Therapy Evaluation Patient Details Name: Suzanne Dickson MRN: [id number] DOB: January 10, 1946 Today's Date: 05/27/2022  History of Present Illness  Pt is 77 year old presented to [facility] on  4/29 for acute on chronic hypoxic resp failure requiring bipap. PMH - COPD with chronic respiratory failure on 6-8 L O2 at home, CAD, HTN, h/o adenocarcinoma on the left lung, OSA  Clinical Impression  Pt presents to PT with slight decr in gait due to illness and inactivity. Pt limited at baseline due to respiratory status. Expect pt will make good progress back to baseline with mobility. Will follow acutely but doubt pt will need PT after DC.         Recommendations for follow up therapy are one component of a multi-disciplinary discharge planning process, led by the attending physician.  Recommendations may be updated based on patient status, additional functional criteria and insurance authorization.  Follow Up Recommendations       Assistance Recommended at Discharge Intermittent Supervision/Assistance  Patient can return home with the following  A little help with bathing/dressing/bathroom;Assistance with cooking/housework;Help with stairs or ramp for entrance    Equipment Recommendations None recommended by PT  Recommendations for Other Services       Functional Status Assessment Patient has had a recent decline in their functional status and demonstrates the ability to make significant improvements in function in a reasonable and predictable amount of time.     Precautions / Restrictions Precautions Precautions: Fall;Other (comment) Precaution Comments: monitor O2 (6-8 L at baseline) and HR Restrictions Weight Bearing Restrictions: No      Mobility  Bed Mobility               General bed mobility comments: in chair on entry    Transfers Overall transfer level: Needs assistance Equipment used: Rollator (4 wheels) Transfers: Sit to/from Stand Sit to Stand: Min guard            General transfer comment: Assist for safety and lines    Ambulation/Gait Ambulation/Gait assistance: Min guard Gait Distance (Feet): 80 Feet Assistive device: Rollator (4 wheels) Gait Pattern/deviations: Step-through pattern, Decreased stride length, Trunk flexed Gait velocity: decr Gait velocity interpretation: <1.31 ft/sec, indicative of household ambulator   General Gait Details: Assist for Industrial/product designer    Modified Rankin (Stroke Patients Only)       Balance Overall balance assessment: Needs assistance Sitting-balance support: Feet supported, No upper extremity supported Sitting balance-Leahy Scale: Good     Standing balance support: Bilateral upper extremity supported, During functional activity Standing balance-Leahy Scale: Poor Standing balance comment: UE suppport and min guard                             Pertinent Vitals/Pain Pain Assessment Pain Assessment: No/denies pain    Home Living Family/patient expects to be discharged to:: Private residence Living Arrangements: Spouse/significant other Available Help at Discharge: Family;Available 24 hours/day Type of Home: House Home Access: Stairs to enter Entrance Stairs-Rails: Doctor, general practice of Steps: 3   Home Layout: Two level;Able to live on main level with bedroom/bathroom Home Equipment: Rollator (4 wheels);Shower seat;Wheelchair - manual Additional Comments: Has of lift in tub when she wants to take bath. Home O2    Prior Function Prior Level of Function : Independent/Modified Independent  Mobility Comments: Uses rollator at times and w/c in community ADLs Comments: able to manage ADLs with increased time, some light IADLs in the home     Hand Dominance   Dominant Hand: Right    Extremity/Trunk Assessment   Upper Extremity Assessment Upper Extremity Assessment: Defer to OT evaluation     Lower Extremity Assessment Lower Extremity Assessment: Generalized weakness    Cervical / Trunk Assessment Cervical / Trunk Assessment: Normal  Communication   Communication: No difficulties  Cognition Arousal/Alertness: Awake/alert Behavior During Therapy: WFL for tasks assessed/performed Overall Cognitive Status: Within Functional Limits for tasks assessed                                          General Comments General comments (skin integrity, edema, etc.): HR to 150's with amb. SpO2 87-90% on 6-8L    Exercises     Assessment/Plan    PT Assessment Patient needs continued PT services  PT Problem List Decreased strength;Decreased activity tolerance;Decreased balance;Decreased mobility;Cardiopulmonary status limiting activity       PT Treatment Interventions DME instruction;Gait training;Stair training;Functional mobility training;Therapeutic activities;Therapeutic exercise;Balance training;Patient/family education    PT Goals (Current goals can be found in the Care Plan section)  Acute Rehab PT Goals Patient Stated Goal: Get into bed at home PT Goal Formulation: With patient Time For Goal Achievement: 06/10/22 Potential to Achieve Goals: Good    Frequency Min 3X/week     Co-evaluation PT/OT/SLP Co-Evaluation/Treatment: Yes Reason for Co-Treatment: Other (comment) (Limited activity tolerance) PT goals addressed during session: Mobility/safety with mobility         AM-PAC PT "6 Clicks" Mobility  Outcome Measure Help needed turning from your back to your side while in a flat bed without using bedrails?: A Little Help needed moving from lying on your back to sitting on the side of a flat bed without using bedrails?: A Little Help needed moving to and from a bed to a chair (including a wheelchair)?: A Little Help needed standing up from a chair using your arms (e.g., wheelchair or bedside chair)?: A Little Help needed to walk in hospital room?:  A Little Help needed climbing 3-5 steps with a railing? : A Little 6 Click Score: 18    End of Session Equipment Utilized During Treatment: Oxygen Activity Tolerance: Other (comment) (limited by respiratory status and HR) Patient left: in chair;with call bell/phone within reach Nurse Communication: Mobility status PT Visit Diagnosis: Other abnormalities of gait and mobility (R26.89);Muscle weakness (generalized) (M62.81)    Time: 1610-9604 PT Time Calculation (min) (ACUTE ONLY): 16 min   Charges:   PT Evaluation $PT Eval Moderate Complexity: 1 Mod          [facility] PT Acute Rehabilitation Services Office [phone]   [facility] 05/27/2022, 1:55 PM

## 2022-05-27 NOTE — Progress Notes (Signed)
Pt transferred to 5W24 with all belongings. VSS. Pt states "I will call my husband and let himm know what room I am in."

## 2022-05-27 NOTE — Progress Notes (Signed)
Patient with end-stage COPD O2 dependent 6 to 8 L at home. Transferred to intensive care unit and most likely will require intubation unless her CODE STATUS is changed.  TOC following.

## 2022-05-27 NOTE — Evaluation (Signed)
Occupational Therapy Evaluation Patient Details Name: Suzanne Dickson MRN: [id number] DOB: [birth year] Today's Date: 05/27/2022   History of Present Illness Pt is 77 year old presented to [facility] on  4/29 for acute on chronic hypoxic resp failure requiring bipap. PMH - COPD with chronic respiratory failure on 6-8 L O2 at home, CAD, HTN, h/o adenocarcinoma on the left lung, OSA   Clinical Impression   PTA, pt lives with spouse, typically ambulatory with Rollator in the home and able to manage ADLs w/ increased time. Pt uses 6-8 L O2 at baseline. Pt presents now with baseline cardiopulmonary tolerance deficits slightly worse than usual per pt. Overall, pt able to mobilize with Rollator at min guard with SpO2 87-89% on 8 L O2. Pt requires no more than min guard for LB ADLs. Pt also very familiar with energy conservation strategies. Anticipate no OT needs at DC but will follow acutely for likely one additional session to ensure progression.    HR up to 157bpm w/ activity; sustained 120s-130s     Recommendations for follow up therapy are one component of a multi-disciplinary discharge planning process, led by the attending physician.  Recommendations may be updated based on patient status, additional functional criteria and insurance authorization.   Assistance Recommended at Discharge PRN  Patient can return home with the following A little help with bathing/dressing/bathroom;Assistance with cooking/housework    Functional Status Assessment  Patient has had a recent decline in their functional status and demonstrates the ability to make significant improvements in function in a reasonable and predictable amount of time.  Equipment Recommendations  None recommended by OT    Recommendations for Other Services       Precautions / Restrictions Precautions Precautions: Fall;Other (comment) Precaution Comments: monitor O2 (6-8 L at baseline) and HR Restrictions Weight Bearing Restrictions: No       Mobility Bed Mobility               General bed mobility comments: in chair on entry    Transfers Overall transfer level: Needs assistance Equipment used: Rollator (4 wheels) Transfers: Sit to/from Stand Sit to Stand: Min guard                  Balance Overall balance assessment: Needs assistance Sitting-balance support: Feet supported, No upper extremity supported Sitting balance-Leahy Scale: Good     Standing balance support: Bilateral upper extremity supported, During functional activity Standing balance-Leahy Scale: Poor                             ADL either performed or assessed with clinical judgement   ADL Overall ADL's : Needs assistance/impaired Eating/Feeding: Sitting;Independent   Grooming: Supervision/safety;Standing   Upper Body Bathing: Set up   Lower Body Bathing: Min guard;Sit to/from stand   Upper Body Dressing : Set up   Lower Body Dressing: Min guard;Sit to/from stand;Sitting/lateral leans   Toilet Transfer: Min guard;Ambulation;Rollator (4 wheels)   Toileting- Architect and Hygiene: Min guard;Sitting/lateral lean;Sit to/from stand       Functional mobility during ADLs: Diplomatic Services operational officer (4 wheels) General ADL Comments: Fairly close to baseline w/ pt typically implementing energy conservation strategies at home. Increased DOE w/ activity today but pt reports being used to pacing self w/ activity     Vision Baseline Vision/History: 1 Wears glasses Ability to See in Adequate Light: 0 Adequate Patient Visual Report: No change from baseline Vision Assessment?: No apparent visual deficits  Perception     Praxis      Pertinent Vitals/Pain Pain Assessment Pain Assessment: No/denies pain     Hand Dominance Right   Extremity/Trunk Assessment Upper Extremity Assessment Upper Extremity Assessment: Generalized weakness   Lower Extremity Assessment Lower Extremity Assessment: Defer to PT  evaluation   Cervical / Trunk Assessment Cervical / Trunk Assessment: Normal   Communication Communication Communication: No difficulties   Cognition Arousal/Alertness: Awake/alert Behavior During Therapy: WFL for tasks assessed/performed Overall Cognitive Status: Within Functional Limits for tasks assessed                                       General Comments       Exercises     Shoulder Instructions      Home Living Family/patient expects to be discharged to:: Private residence Living Arrangements: Spouse/significant other Available Help at Discharge: Family;Available 24 hours/day Type of Home: House Home Access: Stairs to enter [employer] of Steps: 3 Entrance Stairs-Rails: Right;Left Home Layout: Two level;Able to live on main level with bedroom/bathroom     Bathroom Shower/Tub: Producer, television/film/video: Standard Bathroom Accessibility: Yes   Home Equipment: Rollator (4 wheels);Shower seat;Wheelchair - manual          Prior Functioning/Environment Prior Level of Function : Independent/Modified Independent             Mobility Comments: Uses rollator at times and w/c in community ADLs Comments: able to manage ADLs with increased time, some light IADLs in the home        OT Problem List: Cardiopulmonary status limiting activity;Decreased activity tolerance      OT Treatment/Interventions: Self-care/ADL training;Therapeutic exercise;Energy conservation;DME and/or AE instruction;Therapeutic activities;Patient/family education    OT Goals(Current goals can be found in the care plan section) Acute Rehab OT Goals Patient Stated Goal: home soon OT Goal Formulation: With patient Time For Goal Achievement: 06/10/22 Potential to Achieve Goals: Good  OT Frequency: Min 2X/week    Co-evaluation              AM-PAC OT "6 Clicks" Daily Activity     Outcome Measure Help from another person eating meals?: None Help  from another person taking care of personal grooming?: A Little Help from another person toileting, which includes using toliet, bedpan, or urinal?: A Little Help from another person bathing (including washing, rinsing, drying)?: A Little Help from another person to put on and taking off regular upper body clothing?: A Little Help from another person to put on and taking off regular lower body clothing?: A Little 6 Click Score: 19   End of Session Equipment Utilized During Treatment: Rollator (4 wheels);Oxygen  Activity Tolerance: Patient tolerated treatment well Patient left: in chair;with call bell/phone within reach  OT Visit Diagnosis: Unsteadiness on feet (R26.81)                Time: 1610-9604 OT Time Calculation (min): 14 min Charges:  OT General Charges $OT Visit: 1 Visit OT Evaluation $OT Eval Low Complexity: 1 Low  Bradd Canary, OTR/L Acute Rehab Services Office: [phone]   Lorre Munroe 05/27/2022, 12:50 PM

## 2022-05-27 NOTE — Progress Notes (Addendum)
eLink Physician-Brief Progress Note Patient Name: Suzanne Dickson DOB: 09-05-1945 MRN: [id number]   Date of Service  05/27/2022  HPI/Events of Note  77 year old at initially presented with sepsis secondary to urinary tract infection with bilateral pyelonephritis complicated by bacteremia.  Patient initially transferred to the ICU for BiPAP in the setting of fevers and shock.  Normally, patient is on home CPAP.  eICU Interventions  Switch CPAP orders to BiPAP for the time being, can resume CPAP at discharge   0223 -patient is not tolerating BiPAP and having intermittent nausea and abdominal pressure.  Patient sleeping now, but not wearing the BiPAP.  Will still obtain a.m. ABG to ensure that she is not becoming more hypercapnic.  Despite history of QT prolongation, latest is 376.  Ordered Zofran.  Intervention Category Minor Interventions: Routine modifications to care plan (e.g. PRN medications for pain, fever)  Aldina Porta 05/27/2022, 9:36 PM

## 2022-05-28 ENCOUNTER — Telehealth: Payer: Self-pay | Admitting: Acute Care

## 2022-05-28 DIAGNOSIS — Z515 Encounter for palliative care: Secondary | ICD-10-CM

## 2022-05-28 DIAGNOSIS — J449 Chronic obstructive pulmonary disease, unspecified: Secondary | ICD-10-CM | POA: Diagnosis not present

## 2022-05-28 DIAGNOSIS — N390 Urinary tract infection, site not specified: Secondary | ICD-10-CM | POA: Diagnosis not present

## 2022-05-28 DIAGNOSIS — Z7189 Other specified counseling: Secondary | ICD-10-CM

## 2022-05-28 DIAGNOSIS — A419 Sepsis, unspecified organism: Secondary | ICD-10-CM | POA: Diagnosis not present

## 2022-05-28 LAB — BLOOD GAS, ARTERIAL
Acid-Base Excess: 6.4 mmol/L — ABNORMAL HIGH (ref 0.0–2.0)
Bicarbonate: 31.3 mmol/L — ABNORMAL HIGH (ref 20.0–28.0)
Drawn by: 55062
O2 Saturation: 94.9 %
Patient temperature: 36.7
pCO2 arterial: 44 mmHg (ref 32–48)
pH, Arterial: 7.45 (ref 7.35–7.45)
pO2, Arterial: 62 mmHg — ABNORMAL LOW (ref 83–108)

## 2022-05-28 LAB — COMPREHENSIVE METABOLIC PANEL
ALT: 22 U/L (ref 0–44)
AST: 21 U/L (ref 15–41)
Albumin: 2.8 g/dL — ABNORMAL LOW (ref 3.5–5.0)
Alkaline Phosphatase: 45 U/L (ref 38–126)
Anion gap: 9 (ref 5–15)
BUN: 18 mg/dL (ref 8–23)
CO2: 28 mmol/L (ref 22–32)
Calcium: 8.9 mg/dL (ref 8.9–10.3)
Chloride: 102 mmol/L (ref 98–111)
Creatinine, Ser: 1.18 mg/dL — ABNORMAL HIGH (ref 0.44–1.00)
GFR, Estimated: 48 mL/min — ABNORMAL LOW (ref >60)
Glucose, Bld: 122 mg/dL — ABNORMAL HIGH (ref 70–99)
Potassium: 3.7 mmol/L (ref 3.5–5.1)
Sodium: 139 mmol/L (ref 135–145)
Total Bilirubin: 0.6 mg/dL (ref 0.3–1.2)
Total Protein: 6.1 g/dL — ABNORMAL LOW (ref 6.5–8.1)

## 2022-05-28 LAB — CBC
HCT: 31.2 % — ABNORMAL LOW (ref 36.0–46.0)
Hemoglobin: 9.3 g/dL — ABNORMAL LOW (ref 12.0–15.0)
MCH: 30.6 pg (ref 26.0–34.0)
MCHC: 29.8 g/dL — ABNORMAL LOW (ref 30.0–36.0)
MCV: 102.6 fL — ABNORMAL HIGH (ref 80.0–100.0)
Platelets: 175 10*3/uL (ref 150–400)
RBC: 3.04 MIL/uL — ABNORMAL LOW (ref 3.87–5.11)
RDW: 11.9 % (ref 11.5–15.5)
WBC: 7.3 10*3/uL (ref 4.0–10.5)
nRBC: 0 % (ref 0.0–0.2)

## 2022-05-28 LAB — PROCALCITONIN: Procalcitonin: 5.92 ng/mL

## 2022-05-28 LAB — CULTURE, BLOOD (ROUTINE X 2)

## 2022-05-28 LAB — GLUCOSE, CAPILLARY
Glucose-Capillary: 130 mg/dL — ABNORMAL HIGH (ref 70–99)
Glucose-Capillary: 145 mg/dL — ABNORMAL HIGH (ref 70–99)
Glucose-Capillary: 159 mg/dL — ABNORMAL HIGH (ref 70–99)

## 2022-05-28 MED ORDER — LIDOCAINE 5 % EX PTCH
2.0000 | MEDICATED_PATCH | CUTANEOUS | Status: DC
  Administered 2022-05-28 – 2022-05-31 (×4): 2 via TRANSDERMAL
  Filled 2022-05-28 (×4): qty 2

## 2022-05-28 MED ORDER — SODIUM CHLORIDE 0.9 % IV SOLN
8.0000 mg | Freq: Four times a day (QID) | INTRAVENOUS | Status: DC | PRN
  Administered 2022-05-28: 8 mg via INTRAVENOUS
  Filled 2022-05-28 (×2): qty 4

## 2022-05-28 MED ORDER — SERTRALINE HCL 100 MG PO TABS
100.0000 mg | ORAL_TABLET | Freq: Every day | ORAL | Status: DC
  Administered 2022-05-28 – 2022-05-31 (×4): 100 mg via ORAL
  Filled 2022-05-28 (×4): qty 1

## 2022-05-28 MED ORDER — PREDNISONE 5 MG PO TABS
10.0000 mg | ORAL_TABLET | Freq: Every day | ORAL | Status: DC
  Administered 2022-05-29 – 2022-05-31 (×3): 10 mg via ORAL
  Filled 2022-05-28 (×3): qty 2

## 2022-05-28 MED ORDER — METOCLOPRAMIDE HCL 5 MG/ML IJ SOLN
5.0000 mg | Freq: Three times a day (TID) | INTRAMUSCULAR | Status: DC | PRN
  Administered 2022-05-28: 5 mg via INTRAVENOUS
  Filled 2022-05-28: qty 2

## 2022-05-28 MED ORDER — ENOXAPARIN SODIUM 40 MG/0.4ML IJ SOSY
40.0000 mg | PREFILLED_SYRINGE | INTRAMUSCULAR | Status: DC
  Administered 2022-05-28 – 2022-05-30 (×3): 40 mg via SUBCUTANEOUS
  Filled 2022-05-28 (×3): qty 0.4

## 2022-05-28 MED ORDER — MELATONIN 3 MG PO TABS
3.0000 mg | ORAL_TABLET | Freq: Every evening | ORAL | Status: DC | PRN
  Administered 2022-05-28 – 2022-05-30 (×3): 3 mg via ORAL
  Filled 2022-05-28 (×3): qty 1

## 2022-05-28 MED ORDER — HYDROCODONE-ACETAMINOPHEN 5-325 MG PO TABS
1.0000 | ORAL_TABLET | ORAL | Status: DC | PRN
  Administered 2022-05-28 – 2022-05-30 (×3): 1 via ORAL
  Filled 2022-05-28 (×3): qty 1

## 2022-05-28 NOTE — Progress Notes (Signed)
PROGRESS NOTE    Suzanne Dickson  ZOX:[id number] DOB: [birth year] DOA: 05/24/2022 PCP: Dot Been, FNP   Chief Complaint  Patient presents with   Altered Mental Status    Brief Narrative:   Patient is a 77 y.o.  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  Patient was transferred to ICU on 4/30, secondary to increased work of breathing, she is transferred back to [facility] 06/24/2022 as her respiratory status has improved.   Significant events: 4/29>> admit to [facility]-sepsis-presumed pyelonephritis.  Post admission-developed SOB-placed on BiPAP. 4/30>> blood cultures positive for Proteus.  Failed liberation of BiPAP.  Transferred to ICU 5/30>>transferred out of ICU   Significant studies: 4/29>> CT head: No acute intracranial abnormality 4/29>> CT abdomen/pelvis: Motion artifact-left mid kidney with stranding/enhancement. 4/30>> CXR: Increased interstitial opacities-pulm edema versus atypical infection.   Significant microbiology data: 4/29>> COVID PCR: Negative 4/29>> blood culture: 1/2-gram-negative rods    Assessment & Plan:   Principal Problem:   Sepsis secondary to UTI [facility]) Active Problems:   Essential hypertension   COPD GOLD III/ 02 dep    Adenocarcinoma of left lung, stage 1 (HCC)   OSA on CPAP   CAD (coronary artery disease)   Macrocytic anemia   Sepsis (HCC)  Severe sepsis secondary to left pyelonephritis and Proteus bacteremia -Severe sepsis present on admission -Workup significant for Proteus bacteremia, continue with IV Rocephin, day 5 out of 7   Acute on chronic hypoxic respiratory failure (on 6-8 L of oxygen at baseline) -Requiring BiPAP support, she was transferred to ICU, currently off  BiPAP. -Continue with CPAP, she was unable to tolerate BiPAP overnight -Continue with oxygen 6 to 8 L which is her baseline - She was encouraged to use incentive spirometry and flutter valve   Acute metabolic encephalopathy Seems to have improved-likely secondary to sepsis physiology CT head negative   Anemia Likely secondary to acute illness Follow CBC   Thrombocytopenia Due to sepsis physiology Mild   COPD-chronic hypoxia-6-8 L of oxygen at baseline On daily prednisone-will resume.   HTN Continue with amlodipine   CKD stage IIIb Close to baseline-mildly elevated creatinine Watch closely.   OSA per spouse-noncompliant with CPAP at home   Stage Ia non-small cell cancer of the lung  Follows with Dr. Arbutus Ped, suspicion for recurrence, following with Dr. Arbutus Ped with plan for PET scan as an outpatient .   BMI: Estimated body mass index is 29.84 kg/m as calculated from the following:   Height as of this encounter: 5\' 2"  (1.575 m).   Weight as of this encounter: 74 kg.    DVT prophylaxis: Lovenox Code Status: Full Family Communication: none at bedside Disposition:   Status is: Inpatient    Consultants:  PCCM   Subjective:  She reports anxiety, lower back pain, dyspnea at baseline.  Objective: Vitals:   05/28/22 0433 05/28/22 0530 05/28/22 0827 05/28/22 0948  BP: 139/64  (!) 150/76   Pulse: (!) 107   (!) 101  Resp: 20   20  Temp: 97.9 F (36.6 C)     TempSrc: Oral  Oral   SpO2: 97% 97%  94%  Weight:      Height:        Intake/Output Summary (Last 24 hours) at 05/28/2022 1343  Last data filed at 05/28/2022 0940 Gross per 24 hour  Intake 180 ml  Output 700 ml  Net -520 ml   Filed Weights   05/24/22 1335  Weight: 74 kg    Examination:  Awake Alert, Oriented X 3, frail, chronically ill-appearing Symmetrical Chest wall movement, air entry at the bases RRR,No Gallops,Rubs or new Murmurs, No Parasternal Heave +ve B.Sounds, Abd Soft, No tenderness,  No rebound - guarding or rigidity. No Cyanosis, Clubbing or edema, No new Rash or bruise       Data Reviewed: I have personally reviewed following labs and imaging studies  CBC: Recent Labs  Lab 05/24/22 1340 05/25/22 0553 05/25/22 2026 05/26/22 0305 05/28/22 0554  WBC 16.0* 18.6*  --  16.3* 7.3  NEUTROABS 14.1*  --   --   --   --   HGB 10.8* 10.3* 10.5* 10.4* 9.3*  HCT 36.6 33.6* 31.0* 33.7* 31.2*  MCV 104.0* 102.4*  --  100.9* 102.6*  PLT 117* 111*  --  130* 175    Basic Metabolic Panel: Recent Labs  Lab 05/24/22 1340 05/25/22 0553 05/25/22 2026 05/26/22 0305 05/28/22 0554  NA 136 140 139 136 139  K 3.7 4.2 3.7 3.6 3.7  CL 96* 102  --  98 102  CO2 26 24  --  23 28  GLUCOSE 153* 139*  --  189* 122*  BUN 23 17  --  29* 18  CREATININE 1.47* 1.47*  --  1.53* 1.18*  CALCIUM 8.9 8.9  --  8.6* 8.9    GFR: Estimated Creatinine Clearance: 38.2 mL/min (A) (by C-G formula based on SCr of 1.18 mg/dL (H)).  Liver Function Tests: Recent Labs  Lab 05/24/22 1340 05/25/22 0553 05/26/22 0305 05/28/22 0554  AST 39 34 24 21  ALT 36 30 24 22   ALKPHOS 64 65 60 45  BILITOT 0.8 0.7 0.8 0.6  PROT 6.6 6.3* 6.2* 6.1*  ALBUMIN 3.4* 3.0* 2.8* 2.8*    CBG: Recent Labs  Lab 05/27/22 0746 05/27/22 1106 05/27/22 1616 05/27/22 2114 05/28/22 1147  GLUCAP 119* 120* 173* 210* 130*     Recent Results (from the past 240 hour(s))  Blood culture (routine x 2)     Status: Abnormal   Collection Time: 05/24/22  1:40 PM   Specimen: BLOOD RIGHT HAND  Result Value Ref Range Status   Specimen Description BLOOD RIGHT HAND  Final   Special Requests   Final    BOTTLES DRAWN AEROBIC AND ANAEROBIC Blood Culture results may not be optimal due to an excessive volume of blood received in culture bottles   Culture  Setup Time   Final    GRAM NEGATIVE RODS IN BOTH AEROBIC AND ANAEROBIC BOTTLES CRITICAL RESULT CALLED TO, READ BACK BY AND VERIFIED WITH: PHARMD JESSICA Judie Petit [phone]  FCP Performed at [facility], [address], [place], [place] [address]    Culture PROTEUS MIRABILIS (A)  Final   Report Status 05/27/2022 FINAL  Final   Organism ID, Bacteria PROTEUS MIRABILIS  Final      Susceptibility   Proteus mirabilis - MIC*    AMPICILLIN <=2 SENSITIVE Sensitive     CEFEPIME <=0.12 SENSITIVE Sensitive     CEFTAZIDIME <=1 SENSITIVE Sensitive     CEFTRIAXONE <=0.25 SENSITIVE Sensitive     CIPROFLOXACIN <=0.25 SENSITIVE Sensitive     GENTAMICIN <=1 SENSITIVE Sensitive     IMIPENEM 4 SENSITIVE Sensitive     TRIMETH/SULFA <=20 SENSITIVE Sensitive  AMPICILLIN/SULBACTAM <=2 SENSITIVE Sensitive     PIP/TAZO <=4 SENSITIVE Sensitive     * PROTEUS MIRABILIS  Blood Culture ID Panel (Reflexed)     Status: Abnormal   Collection Time: 05/24/22  1:40 PM  Result Value Ref Range Status   Enterococcus faecalis NOT DETECTED NOT DETECTED Final   Enterococcus Faecium NOT DETECTED NOT DETECTED Final   Listeria monocytogenes NOT DETECTED NOT DETECTED Final   Staphylococcus species NOT DETECTED NOT DETECTED Final   Staphylococcus aureus (BCID) NOT DETECTED NOT DETECTED Final   Staphylococcus epidermidis NOT DETECTED NOT DETECTED Final   Staphylococcus lugdunensis NOT DETECTED NOT DETECTED Final   Streptococcus species NOT DETECTED NOT DETECTED Final   Streptococcus agalactiae NOT DETECTED NOT DETECTED Final   Streptococcus pneumoniae NOT DETECTED NOT DETECTED Final   Streptococcus pyogenes NOT DETECTED NOT DETECTED Final   A.calcoaceticus-baumannii NOT DETECTED NOT DETECTED Final   Bacteroides fragilis NOT DETECTED NOT DETECTED Final   Enterobacterales DETECTED (A) NOT DETECTED Final    Comment: Enterobacterales represent a large order of gram negative bacteria, not a single organism. CRITICAL RESULT CALLED TO, READ BACK BY AND VERIFIED WITH: PHARMD JESSICA M [phone] FCP    Enterobacter cloacae complex NOT DETECTED NOT DETECTED Final   Escherichia coli NOT  DETECTED NOT DETECTED Final   Klebsiella aerogenes NOT DETECTED NOT DETECTED Final   Klebsiella oxytoca NOT DETECTED NOT DETECTED Final   Klebsiella pneumoniae NOT DETECTED NOT DETECTED Final   Proteus species DETECTED (A) NOT DETECTED Final    Comment: CRITICAL RESULT CALLED TO, READ BACK BY AND VERIFIED WITH: PHARMD JESSICA M [phone] FCP    Salmonella species NOT DETECTED NOT DETECTED Final   Serratia marcescens NOT DETECTED NOT DETECTED Final   Haemophilus influenzae NOT DETECTED NOT DETECTED Final   Neisseria meningitidis NOT DETECTED NOT DETECTED Final   Pseudomonas aeruginosa NOT DETECTED NOT DETECTED Final   Stenotrophomonas maltophilia NOT DETECTED NOT DETECTED Final   Candida albicans NOT DETECTED NOT DETECTED Final   Candida auris NOT DETECTED NOT DETECTED Final   Candida glabrata NOT DETECTED NOT DETECTED Final   Candida krusei NOT DETECTED NOT DETECTED Final   Candida parapsilosis NOT DETECTED NOT DETECTED Final   Candida tropicalis NOT DETECTED NOT DETECTED Final   Cryptococcus neoformans/gattii NOT DETECTED NOT DETECTED Final   CTX-M ESBL NOT DETECTED NOT DETECTED Final   Carbapenem resistance IMP NOT DETECTED NOT DETECTED Final   Carbapenem resistance KPC NOT DETECTED NOT DETECTED Final   Carbapenem resistance NDM NOT DETECTED NOT DETECTED Final   Carbapenem resist OXA 48 LIKE NOT DETECTED NOT DETECTED Final   Carbapenem resistance VIM NOT DETECTED NOT DETECTED Final    Comment: Performed at [facility], [address] [place], [place] [address]  SARS Coronavirus 2 by RT PCR (hospital order, performed in [facility] hospital lab) *cepheid single result test*     Status: None   Collection Time: 05/24/22  1:46 PM   Specimen: Nasal Swab  Result Value Ref Range Status   SARS Coronavirus 2 by RT PCR NEGATIVE NEGATIVE Final    Comment: Performed at [facility], [address] [place], [place] [address]  Blood culture (routine x 2)     Status: None  (Preliminary result)   Collection Time: 05/24/22  3:49 PM   Specimen: BLOOD  Result Value Ref Range Status   Specimen Description BLOOD BLOOD LEFT FOREARM  Final   Special Requests   Final  BOTTLES DRAWN AEROBIC AND ANAEROBIC Blood Culture results may not be optimal due to an inadequate volume of blood received in culture bottles   Culture   Final    NO GROWTH 4 DAYS Performed at [facility], [address] [place], [place] [address]    Report Status PENDING  Incomplete  MRSA Next Gen by PCR, Nasal     Status: None   Collection Time: 05/25/22 12:01 PM   Specimen: Nasal Mucosa; Nasal Swab  Result Value Ref Range Status   MRSA by PCR Next Gen NOT DETECTED NOT DETECTED Final    Comment: (NOTE) The GeneXpert MRSA Assay (FDA approved for NASAL specimens only), is one component of a comprehensive MRSA colonization surveillance program. It is not intended to diagnose MRSA infection nor to guide or monitor treatment for MRSA infections. Test performance is not FDA approved in patients less than 50 years old. Performed at [facility], [address] [place], [place] [address]          Radiology Studies: No results found.      Scheduled Meds:  amLODipine  5 mg Oral Daily   arformoterol  15 mcg Nebulization BID   aspirin EC  81 mg Oral Daily   atorvastatin  20 mg Oral QHS   budesonide (PULMICORT) nebulizer solution  0.25 mg Nebulization BID   busPIRone  7.5 mg Oral TID   Chlorhexidine Gluconate Cloth  6 each Topical Daily   clopidogrel  75 mg Oral Daily   docusate sodium  100 mg Oral BID   enoxaparin (LOVENOX) injection  40 mg Subcutaneous Q24H   insulin aspart  0-15 Units Subcutaneous TID WC   levalbuterol  0.63 mg Nebulization Q8H   And   ipratropium  0.5 mg Nebulization Q8H   lidocaine  2 patch Transdermal Q24H   pantoprazole  40 mg Oral QAC breakfast   polyethylene glycol  17 g Oral BID   revefenacin  175 mcg Nebulization Daily   sertraline  100 mg  Oral Daily   sodium chloride flush  3 mL Intravenous Q12H   Continuous Infusions:  cefTRIAXone (ROCEPHIN)  IV 2 g (05/27/22 1503)   ondansetron (ZOFRAN) IV 8 mg (05/28/22 1304)     LOS: 4 days       Huey Bienenstock, MD [facility]   To contact the attending provider between 7A-7P or the covering provider during after hours 7P-7A, please log into the web site [email or web address] and access using universal [place] password for that web site. If you do not have the password, please call the hospital operator.  05/28/2022, 1:43 PM

## 2022-05-28 NOTE — Progress Notes (Signed)
ABG obtained on 5L salter   Latest Reference Range & Units 05/28/22 03:40  pH, Arterial 7.35 - 7.45  7.45  pCO2 arterial 32 - 48 mmHg 44  pO2, Arterial 83 - 108 mmHg 62 (L)  Acid-Base Excess 0.0 - 2.0 mmol/L 6.4 (H)  Bicarbonate 20.0 - 28.0 mmol/L 31.3 (H)  O2 Saturation % 94.9  Patient temperature  36.7  Collection site  LEFT RADIAL  Allens test (pass/fail) PASS  PASS  (L): Data is abnormally low (H): Data is abnormally high

## 2022-05-28 NOTE — Progress Notes (Signed)
Physical Therapy Treatment Patient Details Name: Suzanne Dickson MRN: [id number] DOB: [birth year] Today's Date: 05/28/2022   History of Present Illness Pt is 77 year old presented to [facility] on  4/29 for acute on chronic hypoxic resp failure requiring bipap. PMH - COPD with chronic respiratory failure on 6-8 L O2 at home, CAD, HTN, h/o adenocarcinoma on the left lung, OSA    PT Comments    Patient resting in recliner, lethargic at start but alerted to voice and became more alert with position change to sit upright in recliner. Pt mumbling words some and BP noted to be lower from 150/76 at ~8:30 to 112/37 and then 101/80 during session. Pt recently recieved medication and for pain and suspect this may be related to he fatigue. Pt was able to complete LE exercises in recliner and repositioned for comfort. Will continue to progress as able.    Recommendations for follow up therapy are one component of a multi-disciplinary discharge planning process, led by the attending physician.  Recommendations may be updated based on patient status, additional functional criteria and insurance authorization.  Follow Up Recommendations       Assistance Recommended at Discharge Intermittent Supervision/Assistance  Patient can return home with the following A little help with bathing/dressing/bathroom;Assistance with cooking/housework;Help with stairs or ramp for entrance   Equipment Recommendations  None recommended by PT    Recommendations for Other Services       Precautions / Restrictions Precautions Precautions: Fall;Other (comment) Precaution Comments: monitor O2 (6-8 L at baseline) and HR Restrictions Weight Bearing Restrictions: No     Mobility  Bed Mobility               General bed mobility comments: OOB in recliner    Transfers Overall transfer level: Needs assistance Equipment used: Rollator (4 wheels) Transfers: Sit to/from Stand Sit to Stand: Min guard            General transfer comment: pt able to power up from recliner with bil UE use. pt reporting fatigued with standing. Leaning on RW for break and ultimately returned to sit in recliner.    Ambulation/Gait               General Gait Details: deferred due to significant lethargy and grogginess, BP soft as well   Stairs             Wheelchair Mobility    Modified Rankin (Stroke Patients Only)       Balance Overall balance assessment: Needs assistance Sitting-balance support: Feet supported, No upper extremity supported Sitting balance-Leahy Scale: Good     Standing balance support: Bilateral upper extremity supported, During functional activity Standing balance-Leahy Scale: Poor Standing balance comment: UE suppport and min guard                            Cognition Arousal/Alertness: Awake/alert Behavior During Therapy: WFL for tasks assessed/performed Overall Cognitive Status: Within Functional Limits for tasks assessed                                          Exercises General Exercises - Lower Extremity Ankle Circles/Pumps: AROM, Both, 20 reps Long Arc Quad: AROM, Both, 10 reps Hip Flexion/Marching: AROM, Both, 10 reps    General Comments        Pertinent Vitals/Pain Pain Assessment Pain Assessment:  No/denies pain    Home Living                          Prior Function            PT Goals (current goals can now be found in the care plan section) Acute Rehab PT Goals Patient Stated Goal: Get into bed at home PT Goal Formulation: With patient Time For Goal Achievement: 06/10/22 Potential to Achieve Goals: Good Progress towards PT goals: Progressing toward goals (slow)    Frequency    Min 3X/week      PT Plan Current plan remains appropriate    Co-evaluation              AM-PAC PT "6 Clicks" Mobility   Outcome Measure  Help needed turning from your back to your side while in a flat bed  without using bedrails?: A Little Help needed moving from lying on your back to sitting on the side of a flat bed without using bedrails?: A Little Help needed moving to and from a bed to a chair (including a wheelchair)?: A Little Help needed standing up from a chair using your arms (e.g., wheelchair or bedside chair)?: A Little Help needed to walk in hospital room?: A Little Help needed climbing 3-5 steps with a railing? : A Little 6 Click Score: 18    End of Session Equipment Utilized During Treatment: Oxygen Activity Tolerance: Other (comment);Patient limited by lethargy;Patient limited by fatigue (limited by soft BP) Patient left: in chair;with call bell/phone within reach Nurse Communication: Mobility status PT Visit Diagnosis: Other abnormalities of gait and mobility (R26.89);Muscle weakness (generalized) (M62.81)     Time: 1610-9604 PT Time Calculation (min) (ACUTE ONLY): 30 min  Charges:  $Therapeutic Exercise: 8-22 mins $Therapeutic Activity: 8-22 mins                     Wynn Maudlin, DPT Acute Rehabilitation Services Office [phone]  05/28/22 1:00 PM

## 2022-05-28 NOTE — Consult Note (Signed)
Palliative Care Consult Note                                  Date: 05/28/2022   Patient Name: Suzanne Dickson  DOB: [birth year]  MRN: [id number]  Age / Sex: 77 y.o., female  PCP: Suzanne Been, FNP Referring Physician: Starleen Arms, MD  Reason for Consultation: {Reason for Consult:23484}  HPI/Patient Profile: 77 y.o. female  with past medical history of *** admitted on 05/24/2022 with ***.   Past Medical History:  Diagnosis Date   Adenocarcinoma of left lung, 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   "I'm in remission" (04/05/2017)   Adenocarcinoma, lung (HCC)    Anxiety    Arthritis    "legs, elbows" (04/05/2017)   Bronchitis, chronic (HCC)    "haven't had it in awhile" (04/05/2017)   CHF (congestive heart failure) (HCC)    COPD (chronic obstructive pulmonary disease) (HCC)    Coronary artery disease    Daily headache    "recently" (04/05/2017)   Depression    Dyspnea    with exertion   GERD (gastroesophageal reflux disease)    History of blood transfusion 1950s   History of radiation therapy 05/04/16 - 05/12/16   Left lung treated to 54 Gy with 3 fx of 18 Gy   Hypertension    On home oxygen therapy    "2L; 24/7" (04/05/2017)   OSA (obstructive sleep apnea)    OSA on CPAP    Pneumonia 10/2015   "at least 3 times" (04/05/2017)   Pulmonary hypertension (HCC)     Subjective:   This NP Suzanne Dickson reviewed medical records, received report from team, assessed the patient and then meet at the patient's bedside to discuss diagnosis, prognosis, GOC, EOL wishes disposition and options.  I met with ***.   Concept of Palliative Care was introduced as specialized medical care for people and their families living with serious illness.  If focuses on providing relief from the symptoms and stress of a serious illness.  The goal is to improve quality of life for both the patient and the family. Values and goals of care important to patient and family  were attempted to be elicited.  Created space and opportunity for patient  and family to explore thoughts and feelings regarding current medical situation   Natural trajectory and current clinical status were discussed. Questions and concerns addressed. Patient  encouraged to call with questions or concerns.    Patient/Family Understanding of Illness: ***  Life Review: ***  Patient Values: ***  Goals: ***  Today's Discussion: ***  Review of Systems  Objective:   Primary Diagnoses: Present on Admission:  Macrocytic anemia  Essential hypertension  COPD GOLD III/ 02 dep   CAD (coronary artery disease)  Adenocarcinoma of left lung, stage 1 (HCC)  Sepsis secondary to UTI (HCC)  Sepsis (HCC)   Physical Exam  Vital Signs:  BP (!) 150/76 (BP Location: Left Arm)   Pulse (!) 101   Temp 97.9 F (36.6 C) (Oral)   Resp 20   Ht 5\' 2"  (1.575 m)   Wt 74 kg   SpO2 94%   BMI 29.84 kg/m   Palliative Assessment/Data: ***    Advanced Care Planning:   Existing Vynca/ACP Documentation: ***  Primary Decision Maker: {Primary Decision WUJWJ:19147}  Code Status/Advance Care Planning: {Palliative Code status:23503}  A discussion was had today regarding advanced directives. Concepts specific  to code status, artifical feeding and hydration, continued IV antibiotics and rehospitalization was had.  The difference between a aggressive medical intervention path and a palliative comfort care path for this patient at this time was had. ***The MOST form was introduced and discussed.***  Decisions/Changes to ACP: ***  Assessment & Plan:   Impression: ***  SUMMARY OF RECOMMENDATIONS   ***  Symptom Management:  ***  Prognosis:  {Palliative Care Prognosis:23504}  Discharge Planning:  {Palliative dispostion:23505}   Discussed with: ***    Thank you for allowing [place] to participate in the care of Suzanne Dickson PMT will continue to support holistically.  Time Total:  ***  Greater than 50%  of this time was spent counseling and coordinating care related to the above assessment and plan.  Signed by: Suzanne Dust, NP Palliative Medicine Team  Team Phone # [phone] (Nights/Weekends)  05/28/2022, 4:37 PM

## 2022-05-28 NOTE — Plan of Care (Signed)

## 2022-05-28 NOTE — Progress Notes (Signed)
NAME:  Suzanne Dickson, MRN:  [id number], DOB:  08-04-1945, LOS: 3 ADMISSION DATE:  05/24/2022, CONSULTATION DATE: 05/25/2022  REFERRING MD: [facility] acute on chronic hypoxic respiratory failure, CHIEF COMPLAINT: Acute on chronic hypoxic respiratory failure  History of Present Illness:  77 year old female with near end-stage COPD O2 dependent 6 to 8 L at home and has OSA and should wear CPAP at home.  She been on noninvasive overnight and is not improving.  She is now in acute respiratory distress heart rate has been 140 respiratory rate is 42 and she is obviously tiring out.  Due to her complex medical history as documented below she be transferred to intensive care unit and most likely will require intubation unless her CODE STATUS is changed.  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   "I'm in remission" (04/05/2017)   Adenocarcinoma, lung (HCC)    Anxiety    Arthritis    "legs, elbows" (04/05/2017)   Bronchitis, chronic (HCC)    "haven't had it in awhile" (04/05/2017)   CHF (congestive heart failure) (HCC)    COPD (chronic obstructive pulmonary disease) (HCC)    Coronary artery disease    Daily headache    "recently" (04/05/2017)   Depression    Dyspnea    with exertion   GERD (gastroesophageal reflux disease)    History of blood transfusion 1950s   History of radiation therapy 05/04/16 - 05/12/16   Left lung treated to 54 Gy with 3 fx of 18 Gy   Hypertension    On home oxygen therapy    "2L; 24/7" (04/05/2017)   OSA (obstructive sleep apnea)    OSA on CPAP    Pneumonia 10/2015   "at least 3 times" (04/05/2017)   Pulmonary hypertension (HCC)      Significant Hospital Events: Including procedures, antibiotic start and stop dates in addition to other pertinent events   Transfer to ICU 4/30, with worsening delirium/encephalopathy secondary to sepsis and associated increased work of breathing 5/1 echo  LVEF 65-70%, normal diastolic function. Hyperdynamic RV, elevated PASP. Mild MR. Mild to mod TR. IVC normal with normal variability. Breathing a little better but not at baseline and SOB after eating. Discussing GOC. Pt reported to team does not want vent/trach etc..but wanted to d/w family before making formal advanced directives.  5/2 ready for transfer to out of the intensive care palliative care consultation requested for goals of care discussion with her and her husband  Interim History / Subjective:  Multiple complaints. Mostly about comfort and anxiety   Objective   Blood pressure 135/61, pulse (Abnormal) 111, temperature 98 F (36.7 C), temperature source Oral, resp. rate (Abnormal) 29, height 5\' 2"  (1.575 m), weight 74 kg, SpO2 92 %.    FiO2 (%):  [40 %] 40 % PEEP:  [8 cmH20] 8 cmH20 Pressure Support:  [8 cmH20] 8 cmH20   Intake/Output Summary (Last 24 hours) at 05/27/2022 1126 Last data filed at 05/27/2022 0800 Gross per 24 hour  Intake 427.48 ml  Output 1300 ml  Net -872.52 ml   Filed Weights   05/24/22 1335  Weight: 74 kg    Examination: General 77 year old female sitting up in bed. No distress HENT NCAT no JVD  Pulm dec bases on accessory use  Card rrr Abd soft Ext warm and dry  Neuro anxious but intact.   Resolved Hospital Problem list   Sepsis  resolved Acute metabolic encephalopathy secondary to sepsis Assessment & Plan:  Acute on chronic hypoxic respiratory failure requiring BiPAP, on 6-8 L of home oxygen.   COPD not acutely exacerbated.  OSA Proteus Mirabilis bacteremia; urinary tract source (pansens org) History of lung cancer adenocarcinoma treated with radiation; concern for recurrence History of coronary artery disease with PCI and acute on chronic HFpEF HTN GERD Morbid obesity Anxiety Back pain   Pulm problem list Chronic respiratory failure 6-8 lpm baseline) in setting of advanced COPD -am ABG suggests if she can be compliant w/ her CPAP should  be OK to continue Plan Cont supplemental oxygen Cont Nebulized BDs; return to her trelegy at baseline Will arrange OP follow up w/ our office Awaiting palliative care consult    All other issues as above to be addressed by medical team   Best Practice (right click and "Reselect all SmartList Selections" daily)   Diet/type: Regular consistency (see orders) DVT prophylaxis: LMWH GI prophylaxis: PPI Lines: N/A Foley:  N/A Code Status:  full code Last date of multidisciplinary goals of care discussion [continue full aggressive care-5/1]    Critical care time: NA      Simonne Martinet ACNP-BC [facility] Pulmonary/Critical Care Pager # [phone] OR # [phone] if no answer

## 2022-05-28 NOTE — Progress Notes (Signed)
Patient stated that she was nauseous prior to use of BIPAP and it has made her stomach feel worse. Patient currently on nasal cannula tolerating well, RT will continue to monitor patient.

## 2022-05-29 DIAGNOSIS — A419 Sepsis, unspecified organism: Secondary | ICD-10-CM | POA: Diagnosis not present

## 2022-05-29 DIAGNOSIS — N390 Urinary tract infection, site not specified: Secondary | ICD-10-CM | POA: Diagnosis not present

## 2022-05-29 LAB — GLUCOSE, CAPILLARY
Glucose-Capillary: 132 mg/dL — ABNORMAL HIGH (ref 70–99)
Glucose-Capillary: 161 mg/dL — ABNORMAL HIGH (ref 70–99)
Glucose-Capillary: 164 mg/dL — ABNORMAL HIGH (ref 70–99)
Glucose-Capillary: 219 mg/dL — ABNORMAL HIGH (ref 70–99)

## 2022-05-29 LAB — CULTURE, BLOOD (ROUTINE X 2)

## 2022-05-29 MED ORDER — METHYLPREDNISOLONE SODIUM SUCC 125 MG IJ SOLR
125.0000 mg | Freq: Once | INTRAMUSCULAR | Status: AC
  Administered 2022-05-29: 125 mg via INTRAVENOUS
  Filled 2022-05-29: qty 2

## 2022-05-29 MED ORDER — LEVALBUTEROL HCL 0.63 MG/3ML IN NEBU
0.6300 mg | INHALATION_SOLUTION | Freq: Two times a day (BID) | RESPIRATORY_TRACT | Status: DC
  Administered 2022-05-29 – 2022-05-30 (×2): 0.63 mg via RESPIRATORY_TRACT
  Filled 2022-05-29 (×2): qty 3

## 2022-05-29 MED ORDER — IPRATROPIUM BROMIDE 0.02 % IN SOLN
0.5000 mg | Freq: Two times a day (BID) | RESPIRATORY_TRACT | Status: DC
  Administered 2022-05-29 – 2022-05-30 (×2): 0.5 mg via RESPIRATORY_TRACT
  Filled 2022-05-29 (×2): qty 2.5

## 2022-05-29 NOTE — Progress Notes (Signed)
PROGRESS NOTE    Suzanne Dickson  ZOX:[id number] DOB: [birth year] DOA: 05/24/2022 PCP: Dot Been, FNP   Chief Complaint  Patient presents with   Altered Mental Status    Brief Narrative:   Patient is a 77 y.o.  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  Patient was transferred to ICU on 4/30, secondary to increased work of breathing, she is transferred back to [facility] 06/24/2022 as her respiratory status has improved.   Significant events: 4/29>> admit to [facility]-sepsis-presumed pyelonephritis.  Post admission-developed SOB-placed on BiPAP. 4/30>> blood cultures positive for Proteus.  Failed liberation of BiPAP.  Transferred to ICU 5/30>>transferred out of ICU   Significant studies: 4/29>> CT head: No acute intracranial abnormality 4/29>> CT abdomen/pelvis: Motion artifact-left mid kidney with stranding/enhancement. 4/30>> CXR: Increased interstitial opacities-pulm edema versus atypical infection.   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 -Continue with CPAP, she was unable to tolerate BiPAP overnight -Continue with oxygen 6 to 8 L which is her baseline - She was encouraged to use incentive spirometry and flutter valve   Acute metabolic encephalopathy Seems to have improved-likely secondary to sepsis physiology CT head negative   Anemia Likely secondary to acute illness Follow CBC   Thrombocytopenia Due to sepsis physiology Mild   COPD-chronic hypoxia-6-8 L of oxygen at baseline On daily prednisone, she is with some wheezing today, will give her 1 dose of IV steroids   HTN Continue with amlodipine   CKD stage IIIb Close to baseline-mildly elevated creatinine Watch closely.   OSA per spouse-noncompliant with CPAP at home   Stage Ia non-small cell cancer of the lung  Follows with Dr. Arbutus Ped, suspicion for recurrence, following with Dr. Arbutus Ped with plan for PET scan as an outpatient .   BMI: Estimated body mass index is 29.84 kg/m as calculated from the following:   Height as of this encounter: 5\' 2"  (1.575 m).   Weight as of this encounter: 74 kg.   Goals of care -Palliative  medicine consulted   DVT prophylaxis: Lovenox Code Status: Full Family Communication: Discussed with husband at bedside Disposition:   Status is: Inpatient    Consultants:  PCCM Palliative   Subjective: Patient reports she is feeling weak, tired and exhausted today Objective: Vitals:   05/29/22 0500 05/29/22 0745 05/29/22 0925 05/29/22 1134  BP:  (!) 154/81  (!) 144/70  Pulse:  (!) 107 (!) 107   Resp:  16 20   Temp:  98.3 F (36.8 C)    TempSrc:  Oral  Oral  SpO2: 96% 94% 95%  Weight:      Height:        Intake/Output Summary (Last 24 hours) at 05/29/2022 1323 Last data filed at 05/29/2022 0745 Gross per 24 hour  Intake 734.12 ml  Output 350 ml  Net 384.12 ml   Filed Weights   05/24/22 1335  Weight: 74 kg    Examination:  Awake Alert, Oriented X 3, frail, chronic ill-appearing,  deconditioned Symmetrical Chest wall movement, diminished air entry bilaterally with wheezing RRR,No Gallops,Rubs or new Murmurs, No Parasternal Heave +ve B.Sounds, Abd Soft, No tenderness, No rebound - guarding or rigidity. No Cyanosis, Clubbing or edema, No new Rash or bruise        Data Reviewed: I have personally reviewed following labs and imaging studies  CBC: Recent Labs  Lab 05/24/22 1340 05/25/22 0553 05/25/22 2026 05/26/22 0305 05/28/22 0554  WBC 16.0* 18.6*  --  16.3* 7.3  NEUTROABS 14.1*  --   --   --   --   HGB 10.8* 10.3* 10.5* 10.4* 9.3*  HCT 36.6 33.6* 31.0* 33.7* 31.2*  MCV 104.0* 102.4*  --  100.9* 102.6*  PLT 117* 111*  --  130* 175    Basic Metabolic Panel: Recent Labs  Lab 05/24/22 1340 05/25/22 0553 05/25/22 2026 05/26/22 0305 05/28/22 0554  NA 136 140 139 136 139  K 3.7 4.2 3.7 3.6 3.7  CL 96* 102  --  98 102  CO2 26 24  --  23 28  GLUCOSE 153* 139*  --  189* 122*  BUN 23 17  --  29* 18  CREATININE 1.47* 1.47*  --  1.53* 1.18*  CALCIUM 8.9 8.9  --  8.6* 8.9    GFR: Estimated Creatinine Clearance: 38.2 mL/min (A) (by C-G formula based on SCr of 1.18 mg/dL (H)).  Liver Function Tests: Recent Labs  Lab 05/24/22 1340 05/25/22 0553 05/26/22 0305 05/28/22 0554  AST 39 34 24 21  ALT 36 30 24 22   ALKPHOS 64 65 60 45  BILITOT 0.8 0.7 0.8 0.6  PROT 6.6 6.3* 6.2* 6.1*  ALBUMIN 3.4* 3.0* 2.8* 2.8*    CBG: Recent Labs  Lab 05/28/22 1147 05/28/22 1502 05/28/22 2107 05/29/22 0744 05/29/22 1132  GLUCAP 130* 145* 159* 132* 161*     Recent Results (from the past 240 hour(s))  Blood culture (routine x 2)     Status: Abnormal   Collection Time: 05/24/22  1:40 PM   Specimen: BLOOD RIGHT HAND  Result Value Ref Range Status   Specimen Description BLOOD RIGHT HAND  Final   Special Requests   Final    BOTTLES DRAWN AEROBIC AND ANAEROBIC Blood Culture results may not be optimal due to an excessive volume of blood received in culture  bottles   Culture  Setup Time   Final    GRAM NEGATIVE RODS IN BOTH AEROBIC AND ANAEROBIC BOTTLES CRITICAL RESULT CALLED TO, READ BACK BY AND VERIFIED WITH: PHARMD JESSICA Judie Petit [phone] FCP Performed at [facility], [address], [place], [place] [address]    Culture PROTEUS MIRABILIS (A)  Final   Report Status 05/27/2022 FINAL  Final   Organism ID, Bacteria PROTEUS MIRABILIS  Final      Susceptibility   Proteus mirabilis - MIC*    AMPICILLIN <=2 SENSITIVE Sensitive     CEFEPIME <=0.12 SENSITIVE Sensitive     CEFTAZIDIME <=1 SENSITIVE Sensitive     CEFTRIAXONE <=0.25 SENSITIVE Sensitive     CIPROFLOXACIN <=0.25 SENSITIVE Sensitive  GENTAMICIN <=1 SENSITIVE Sensitive     IMIPENEM 4 SENSITIVE Sensitive     TRIMETH/SULFA <=20 SENSITIVE Sensitive     AMPICILLIN/SULBACTAM <=2 SENSITIVE Sensitive     PIP/TAZO <=4 SENSITIVE Sensitive     * PROTEUS MIRABILIS  Blood Culture ID Panel (Reflexed)     Status: Abnormal   Collection Time: 05/24/22  1:40 PM  Result Value Ref Range Status   Enterococcus faecalis NOT DETECTED NOT DETECTED Final   Enterococcus Faecium NOT DETECTED NOT DETECTED Final   Listeria monocytogenes NOT DETECTED NOT DETECTED Final   Staphylococcus species NOT DETECTED NOT DETECTED Final   Staphylococcus aureus (BCID) NOT DETECTED NOT DETECTED Final   Staphylococcus epidermidis NOT DETECTED NOT DETECTED Final   Staphylococcus lugdunensis NOT DETECTED NOT DETECTED Final   Streptococcus species NOT DETECTED NOT DETECTED Final   Streptococcus agalactiae NOT DETECTED NOT DETECTED Final   Streptococcus pneumoniae NOT DETECTED NOT DETECTED Final   Streptococcus pyogenes NOT DETECTED NOT DETECTED Final   A.calcoaceticus-baumannii NOT DETECTED NOT DETECTED Final   Bacteroides fragilis NOT DETECTED NOT DETECTED Final   Enterobacterales DETECTED (A) NOT DETECTED Final    Comment: Enterobacterales represent a large order of gram negative bacteria, not a single  organism. CRITICAL RESULT CALLED TO, READ BACK BY AND VERIFIED WITH: PHARMD JESSICA M [phone] FCP    Enterobacter cloacae complex NOT DETECTED NOT DETECTED Final   Escherichia coli NOT DETECTED NOT DETECTED Final   Klebsiella aerogenes NOT DETECTED NOT DETECTED Final   Klebsiella oxytoca NOT DETECTED NOT DETECTED Final   Klebsiella pneumoniae NOT DETECTED NOT DETECTED Final   Proteus species DETECTED (A) NOT DETECTED Final    Comment: CRITICAL RESULT CALLED TO, READ BACK BY AND VERIFIED WITH: PHARMD JESSICA M [phone] FCP    Salmonella species NOT DETECTED NOT DETECTED Final   Serratia marcescens NOT DETECTED NOT DETECTED Final   Haemophilus influenzae NOT DETECTED NOT DETECTED Final   Neisseria meningitidis NOT DETECTED NOT DETECTED Final   Pseudomonas aeruginosa NOT DETECTED NOT DETECTED Final   Stenotrophomonas maltophilia NOT DETECTED NOT DETECTED Final   Candida albicans NOT DETECTED NOT DETECTED Final   Candida auris NOT DETECTED NOT DETECTED Final   Candida glabrata NOT DETECTED NOT DETECTED Final   Candida krusei NOT DETECTED NOT DETECTED Final   Candida parapsilosis NOT DETECTED NOT DETECTED Final   Candida tropicalis NOT DETECTED NOT DETECTED Final   Cryptococcus neoformans/gattii NOT DETECTED NOT DETECTED Final   CTX-M ESBL NOT DETECTED NOT DETECTED Final   Carbapenem resistance IMP NOT DETECTED NOT DETECTED Final   Carbapenem resistance KPC NOT DETECTED NOT DETECTED Final   Carbapenem resistance NDM NOT DETECTED NOT DETECTED Final   Carbapenem resist OXA 48 LIKE NOT DETECTED NOT DETECTED Final   Carbapenem resistance VIM NOT DETECTED NOT DETECTED Final    Comment: Performed at [facility], [address] [place], [place] [address]  SARS Coronavirus 2 by RT PCR (hospital order, performed in [facility] hospital lab) *cepheid single result test*     Status: None   Collection Time: 05/24/22  1:46 PM   Specimen: Nasal Swab  Result Value Ref Range Status    SARS Coronavirus 2 by RT PCR NEGATIVE NEGATIVE Final    Comment: Performed at [facility], [address], [place], [place] [address]  Blood culture (routine x 2)     Status: None   Collection Time: 05/24/22  3:49 PM   Specimen: BLOOD  Result Value  Ref Range Status   Specimen Description BLOOD BLOOD LEFT FOREARM  Final   Special Requests   Final    BOTTLES DRAWN AEROBIC AND ANAEROBIC Blood Culture results may not be optimal due to an inadequate volume of blood received in culture bottles   Culture   Final    NO GROWTH 5 DAYS Performed at [facility], [address], [place], [place] [address]    Report Status 05/29/2022 FINAL  Final  MRSA Next Gen by PCR, Nasal     Status: None   Collection Time: 05/25/22 12:01 PM   Specimen: Nasal Mucosa; Nasal Swab  Result Value Ref Range Status   MRSA by PCR Next Gen NOT DETECTED NOT DETECTED Final    Comment: (NOTE) The GeneXpert MRSA Assay (FDA approved for NASAL specimens only), is one component of a comprehensive MRSA colonization surveillance program. It is not intended to diagnose MRSA infection nor to guide or monitor treatment for MRSA infections. Test performance is not FDA approved in patients less than 11 years old. Performed at [facility], [address], [place], [place] [address]          Radiology Studies: No results found.      Scheduled Meds:  amLODipine  5 mg Oral Daily   arformoterol  15 mcg Nebulization BID   aspirin EC  81 mg Oral Daily   atorvastatin  20 mg Oral QHS   budesonide (PULMICORT) nebulizer solution  0.25 mg Nebulization BID   busPIRone  7.5 mg Oral TID   Chlorhexidine Gluconate Cloth  6 each Topical Daily   clopidogrel  75 mg Oral Daily   docusate sodium  100 mg Oral BID   enoxaparin (LOVENOX) injection  40 mg Subcutaneous Q24H   insulin aspart  0-15 Units Subcutaneous TID WC   levalbuterol  0.63 mg Nebulization Q8H   And   ipratropium  0.5 mg Nebulization Q8H    lidocaine  2 patch Transdermal Q24H   methylPREDNISolone (SOLU-MEDROL) injection  125 mg Intravenous Once   pantoprazole  40 mg Oral QAC breakfast   polyethylene glycol  17 g Oral BID   predniSONE  10 mg Oral Q breakfast   revefenacin  175 mcg Nebulization Daily   sertraline  100 mg Oral Daily   sodium chloride flush  3 mL Intravenous Q12H   Continuous Infusions:  cefTRIAXone (ROCEPHIN)  IV Stopped (05/28/22 1543)   ondansetron (ZOFRAN) IV Stopped (05/28/22 1322)     LOS: 5 days       Huey Bienenstock, MD [facility]   To contact the attending provider between 7A-7P or the covering provider during after hours 7P-7A, please log into the web site [email or web address] and access using universal Parchment password for that web site. If you do not have the password, please call the hospital operator.  05/29/2022, 1:23 PM

## 2022-05-29 NOTE — Progress Notes (Signed)
Patient is nauseous and states she does have a cpap at home but does not wear the machine. NIV at bedside, will continue to monitor.

## 2022-05-29 NOTE — Plan of Care (Signed)

## 2022-05-29 NOTE — Progress Notes (Signed)
Daily Progress Note   Patient Name: Suzanne Dickson       Date: 05/29/2022 DOB: 1945-05-27  Age: 77 y.o. MRN#: [id number] Attending Physician: Starleen Arms, MD Primary Care Physician: Dot Been, FNP Admit Date: 05/24/2022 Length of Stay: 5 days  Reason for Consultation/Follow-up: Establishing goals of care  HPI/Patient Profile:  77 y.o. 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 Patient was transferred to ICU on 4/30, secondary to increased work of breathing and subsequently transferred back to hospitalist service after improvement.  She was admitted on 05/24/2022 with severe sepsis secondary to left pyelonephritis with Proteus bacteremia, acute on chronic hypoxic respiratory failure (baseline 6-8 L of oxygen at home), acute metabolic encephalopathy chronic COPD, CKD stage IIIb, stage Ia non-small cell lung cancer with possible recurrence, and others.    PMT was consulted for GOC conversations.  Subjective:   Subjective: Chart Reviewed. Updates received. Patient Assessed. Created space and opportunity for patient  and family to explore thoughts and feelings regarding current medical situation.  Today's Discussion: Today saw the patient at bedside, no family was present.  Husband was here previously but had to leave her other appointments.  When I entered the room the patient was sleeping but easily awakened just to room [place].  She denies any pain today.  She does not overtly feel short of breath, but does have a persistent cough.  She states that she has been napping quite a bit.  We discussed the visit with her husband this morning and she states that he is struggling with all of this.  I told her that I would  try to call him and set up a day/time for [place] to meet together and discuss her wishes for goals of care.  She is in agreement.  I again attempted to call the patient's husband to set up a day/time for family meeting.  I left a voicemail for call back with the palliative medicine team phone number.  Review of Systems  Constitutional:  Positive for fatigue.  Respiratory:  Positive for cough. Negative for shortness of breath.   Cardiovascular:  Negative for chest pain.  Gastrointestinal:  Negative for abdominal pain, nausea and vomiting.    Objective:   Vital Signs:  BP (!) 144/70 (BP Location: Left Arm)   Pulse (!) 107   Temp 98.3 F (36.8 C) (Oral)   Resp 20   Ht 5\' 2"  (1.575 m)   Wt 74 kg   SpO2 95%   BMI 29.84 kg/m   Physical Exam: Physical Exam Vitals and nursing note reviewed.  Constitutional:      General: She is not in acute distress.    Appearance: She is ill-appearing.  HENT:     Head: Normocephalic and atraumatic.  Cardiovascular:     Rate and Rhythm: Normal rate.  Pulmonary:     Effort: Pulmonary effort is normal. No respiratory distress.     Breath sounds: Wheezing present.  Abdominal:     General: Abdomen is flat.     Palpations: Abdomen is soft.  Skin:    General: Skin is warm and dry.  Neurological:     General: No focal deficit present.     Mental Status: She is alert.  Psychiatric:  Mood and Affect: Mood normal.        Behavior: Behavior normal.     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   CAD (coronary artery disease)  Adenocarcinoma of left lung, stage 1 (HCC)  Sepsis secondary to UTI (HCC)  Sepsis (HCC)  SUMMARY OF RECOMMENDATIONS   Full code for now Continue current scope of care for now Continue attempts to contact the patient's husband for a family meeting PMT  will continue to follow  Symptom Management:  Per primary team PMT is available to assist as needed  Code Status: Full code  Prognosis: Unable to determine  Discharge Planning: To Be Determined  Discussed with: Patient, medical team, nursing team  Thank you for allowing [place] to participate in the care of Normajean G Mathies PMT will continue to support holistically.  Billing based on MDM: High  Problems Addressed: One or more chronic illnesses with severe exacerbation, progression, or side effects of treatment. and One acute or chronic illness or injury that poses a threat to life or bodily function  Amount and/or Complexity of Data: Category 1:Review of prior external note(s) from each unique source and Review of the result(s) of each unique test and Category 3:Discussion of management or test interpretation with external physician/other qualified health care professional/appropriate source (not separately reported) (reviewed outpatient family medicine and pulmonary office visit notes.  Reviewed recent labs including CBC, procalcitonin, CMP, blood gas)  Risks: N/A    Wynne Dust, NP Palliative Medicine Team  Team Phone # [phone] (Nights/Weekends)  09/23/2020, 8:17 AM

## 2022-05-30 DIAGNOSIS — A419 Sepsis, unspecified organism: Secondary | ICD-10-CM | POA: Diagnosis not present

## 2022-05-30 DIAGNOSIS — N390 Urinary tract infection, site not specified: Secondary | ICD-10-CM | POA: Diagnosis not present

## 2022-05-30 DIAGNOSIS — Z66 Do not resuscitate: Secondary | ICD-10-CM

## 2022-05-30 LAB — GLUCOSE, CAPILLARY
Glucose-Capillary: 171 mg/dL — ABNORMAL HIGH (ref 70–99)
Glucose-Capillary: 257 mg/dL — ABNORMAL HIGH (ref 70–99)
Glucose-Capillary: 269 mg/dL — ABNORMAL HIGH (ref 70–99)

## 2022-05-30 MED ORDER — METHYLPREDNISOLONE SODIUM SUCC 125 MG IJ SOLR
125.0000 mg | Freq: Once | INTRAMUSCULAR | Status: AC
  Administered 2022-05-30: 125 mg via INTRAVENOUS
  Filled 2022-05-30: qty 2

## 2022-05-30 NOTE — Progress Notes (Signed)
Daily Progress Note   Patient Name: Suzanne Dickson       Date: 05/30/2022 DOB: July 04, 1945  Age: 77 y.o. MRN#: [id number] Attending Physician: Starleen Arms, MD Primary Care Physician: Dot Been, FNP Admit Date: 05/24/2022 Length of Stay: 6 days  Reason for Consultation/Follow-up: Establishing goals of care  HPI/Patient Profile:  77 y.o. 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 Patient was transferred to ICU on 4/30, secondary to increased work of breathing and subsequently transferred back to hospitalist service after improvement.  She was admitted on 05/24/2022 with severe sepsis secondary to left pyelonephritis with Proteus bacteremia, acute on chronic hypoxic respiratory failure (baseline 6-8 L of oxygen at home), acute metabolic encephalopathy chronic COPD, CKD stage IIIb, stage Ia non-small cell lung cancer with possible recurrence, and others.    PMT was consulted for GOC conversations.  Subjective:   Subjective: Chart Reviewed. Updates received. Patient Assessed. Created space and opportunity for patient  and family to explore thoughts and feelings regarding current medical situation.  Today's Discussion: Today saw the patient at bedside, no family was present.  She states her husband was here for about an hour earlier this morning.  States that he tried to call us, but I have not received a message from our on-call phone.  Overall the patient looks better today.  Seems less short of breath, more comfortable.  She states she feels a bit better.  She is eating about 50 to 75% of her meals.  We encouraged her to continued increased oral intake, possibly use of Ensure or other supplements for protein  supplementation.  We discussed the importance of nutrition and activity and recovery.  We discussed possibility of SNF/rehab.  If this is recommended she will consider it, not sure if she would want to do this yet versus home with home therapy.  We again discussed her wishes for DNR and DNI.  She is said that she would want a DNR and DNI.  We discussed we have made attempts for couple days to involve her husband and these conversations per her request.  However, he seems to be avoiding this.  She agrees with this.  Given failed attempts to involve her husband, she is electing to change her CODE STATUS to DNR and DNI.  She is open to BiPAP and other noninvasive ventilatory measures.  Otherwise, she wishes to remain a full scope of care for now.  We discussed we would continue attempts to contact her husband.  She worries that he will struggle to accept this.  I shared that we are available to discuss with him at any point that he would like.  I shared that our team is available and I will be back on Tuesday to check on her.  I provided emotional and general support through therapeutic listening, empathy, sharing of stories, therapeutic touch, and other techniques. I answered all questions and addressed all concerns to the best of my ability.  Review of Systems  Constitutional:  Positive for fatigue.  Respiratory:  Positive for cough. Negative for shortness of breath (Improved, none at this time).   Cardiovascular:  Negative for chest pain.  Gastrointestinal:  Negative for abdominal pain, nausea and vomiting.  Neurological:  Positive for weakness.  Objective:   Vital Signs:  BP 134/64 (BP Location: Left Arm)   Pulse (!) 111   Temp 98 F (36.7 C) (Oral)   Resp (!) 22   Ht 5\' 2"  (1.575 m)   Wt 74 kg   SpO2 95%   BMI 29.84 kg/m   Physical Exam: Physical Exam Vitals and nursing note reviewed.  Constitutional:      General: She is not in acute distress.    Appearance: She is  ill-appearing.  HENT:     Head: Normocephalic and atraumatic.  Cardiovascular:     Rate and Rhythm: Normal rate.  Pulmonary:     Effort: Pulmonary effort is normal. No respiratory distress.     Comments: Noted cough during my visit Abdominal:     General: Abdomen is flat.     Palpations: Abdomen is soft.  Skin:    General: Skin is warm and dry.  Neurological:     General: No focal deficit present.     Mental Status: She is alert and oriented to person, place, and time.  Psychiatric:        Mood and Affect: Mood normal.        Behavior: Behavior normal.     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   CAD (coronary artery disease)  Adenocarcinoma of left lung, stage 1 (HCC)  Sepsis secondary to UTI (HCC)  Sepsis (HCC)  SUMMARY OF RECOMMENDATIONS   Changed to DNR/DNI Ok with advanced non-invasive measures such as BiPap Continue full scope of care otherwise Continue attempts to involve patient's husband in conversations PMT will plan to check-in 06/01/22  Symptom Management:  Per primary team PMT is available to assist as needed  Code Status: DNR  Prognosis: Unable to determine  Discharge Planning: To Be Determined  Discussed with: Patient, medical team, nursing team  Thank you for allowing [place] to participate in the care of Suzanne Dickson PMT will continue to support holistically.  Billing based on MDM: High  Problems Addressed: One acute or chronic illness or injury that poses a threat to life or bodily function  Amount and/or Complexity of Data: Category 3:Discussion of management or test interpretation with external physician/other qualified health care professional/appropriate source (not separately reported)  Risks: Decision not to resuscitate or to de-escalate care because of  poor prognosis   Wynne Dust, NP Palliative Medicine Team  Team Phone # [phone] (Nights/Weekends)  09/23/2020, 8:17 AM

## 2022-05-30 NOTE — Plan of Care (Signed)

## 2022-05-30 NOTE — Progress Notes (Addendum)
PROGRESS NOTE    Suzanne Dickson  ZOX:[id number] DOB: Nov 16, 1945 DOA: 05/24/2022 PCP: Dot Been, FNP   Chief Complaint  Patient presents with   Altered Mental Status    Brief Narrative:   Patient is a 77 y.o.  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  Patient was transferred to ICU on 4/30, secondary to increased work of breathing, she is transferred back to [facility] 06/24/2022 as her respiratory status has improved.   Significant events: 4/29>> admit to [facility]-sepsis-presumed pyelonephritis.  Post admission-developed SOB-placed on BiPAP. 4/30>> blood cultures positive for Proteus.  Failed liberation of BiPAP.  Transferred to ICU 5/30>>transferred out of ICU   Significant studies: 4/29>> CT head: No acute intracranial abnormality 4/29>> CT abdomen/pelvis: Motion artifact-left mid kidney with stranding/enhancement. 4/30>> CXR: Increased interstitial opacities-pulm edema versus atypical infection.   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 -Continue with CPAP, she was unable to tolerate BiPAP overnight -Continue with oxygen 6 to 8 L which is her baseline - She was encouraged to use incentive spirometry and flutter valve  COPD exacerbation -He is with significant wheezing yesterday, much improved with IV Solu-Medrol, will give another dose of 125 mg of IV Solu-Medrol today.   Acute metabolic encephalopathy Seems to have improved-likely secondary to sepsis physiology CT head negative   Anemia Likely secondary to acute illness Follow CBC   Thrombocytopenia Due to sepsis physiology Mild   COPD-chronic hypoxia-6-8 L of oxygen at baseline On daily prednisone, continue with IV Solu-Medrol.   HTN Continue with amlodipine   CKD stage IIIb Close to baseline-mildly elevated creatinine Watch closely.   OSA per spouse-noncompliant with CPAP at home   Stage Ia non-small cell cancer of the lung  Follows with Dr. Arbutus Ped, suspicion for recurrence, following with Dr. Arbutus Ped with plan for PET scan as an outpatient, which has been done 4/25, unfortunately it does show suspicion for recurrence of malignancy. Marland Kitchen   BMI: Estimated body mass index is 29.84 kg/m as calculated from the following:   Height as of this encounter: 5\' 2"  (1.575 m).   Weight as of this encounter: 74 kg.   Goals of care -Palliative  medicine consulted, tried to arrange meeting with the husband, patient is clear that she wishes for DNR, but she wants her husband to be on board with that, lytic medicine following.   DVT prophylaxis: Lovenox Code Status: Full Family Communication: Discussed with husband at bedside 5/4, none at bedside today Disposition:   Status is: Inpatient    Consultants:  PCCM Palliative   Subjective: She  reports she is feeling better today, appetite has improved. Objective: Vitals:   05/30/22 0020 05/30/22 0434 05/30/22 0800 05/30/22 1200  BP: (!) 147/67 139/74 (!) 156/88 134/64  Pulse: 94 97 (!) 108 (!) 111   Resp: 20 16 19  (!) 22  Temp: 98 F (36.7 C) 98.1 F (36.7 C) 98.2 F (36.8 C) 98 F (36.7 C)  TempSrc: Oral Oral Oral Oral  SpO2:   98% 95%  Weight:      Height:        Intake/Output Summary (Last 24 hours) at 05/30/2022 1232 Last data filed at 05/30/2022 0043 Gross per 24 hour  Intake --  Output 900 ml  Net -900 ml   Filed Weights   05/24/22 1335  Weight: 74 kg    Examination:  Awake Alert, Oriented X 3, frail, chronic ill-appearing, deconditioned Symmetrical Chest wall movement, diminished air entry bilaterally with wheezing RRR,No Gallops,Rubs or new Murmurs, No Parasternal Heave +ve B.Sounds, Abd Soft, No tenderness, No rebound - guarding or rigidity. No Cyanosis, Clubbing or edema, No new Rash or bruise         Data Reviewed: I have personally reviewed following labs and imaging studies  CBC: Recent Labs  Lab 05/24/22 1340 05/25/22 0553 05/25/22 2026 05/26/22 0305 05/28/22 0554  WBC 16.0* 18.6*  --  16.3* 7.3  NEUTROABS 14.1*  --   --   --   --   HGB 10.8* 10.3* 10.5* 10.4* 9.3*  HCT 36.6 33.6* 31.0* 33.7* 31.2*  MCV 104.0* 102.4*  --  100.9* 102.6*  PLT 117* 111*  --  130* 175    Basic Metabolic Panel: Recent Labs  Lab 05/24/22 1340 05/25/22 0553 05/25/22 2026 05/26/22 0305 05/28/22 0554  NA 136 140 139 136 139  K 3.7 4.2 3.7 3.6 3.7  CL 96* 102  --  98 102  CO2 26 24  --  23 28  GLUCOSE 153* 139*  --  189* 122*  BUN 23 17  --  29* 18  CREATININE 1.47* 1.47*  --  1.53* 1.18*  CALCIUM 8.9 8.9  --  8.6* 8.9    GFR: Estimated Creatinine Clearance: 38.2 mL/min (A) (by C-G formula based on SCr of 1.18 mg/dL (H)).  Liver Function Tests: Recent Labs  Lab 05/24/22 1340 05/25/22 0553 05/26/22 0305 05/28/22 0554  AST 39 34 24 21  ALT 36 30 24 22   ALKPHOS 64 65 60 45  BILITOT 0.8 0.7 0.8 0.6  PROT 6.6 6.3* 6.2* 6.1*  ALBUMIN 3.4* 3.0* 2.8* 2.8*    CBG: Recent Labs  Lab 05/29/22 0744 05/29/22 1132 05/29/22 1538 05/29/22 2133  05/30/22 0754  GLUCAP 132* 161* 164* 219* 171*     Recent Results (from the past 240 hour(s))  Blood culture (routine x 2)     Status: Abnormal   Collection Time: 05/24/22  1:40 PM   Specimen: BLOOD RIGHT HAND  Result Value Ref Range Status   Specimen Description BLOOD RIGHT HAND  Final   Special Requests   Final    BOTTLES DRAWN AEROBIC AND ANAEROBIC Blood Culture results may not be optimal due to an excessive volume of blood received in culture bottles   Culture  Setup Time   Final    GRAM NEGATIVE RODS IN BOTH AEROBIC AND ANAEROBIC BOTTLES CRITICAL RESULT CALLED TO, READ BACK BY AND VERIFIED WITH: PHARMD JESSICA Judie Petit [phone] FCP Performed at [facility] Lab, [address], [place], [place] [address]  Culture PROTEUS MIRABILIS (A)  Final   Report Status 05/27/2022 FINAL  Final   Organism ID, Bacteria PROTEUS MIRABILIS  Final      Susceptibility   Proteus mirabilis - MIC*    AMPICILLIN <=2 SENSITIVE Sensitive     CEFEPIME <=0.12 SENSITIVE Sensitive     CEFTAZIDIME <=1 SENSITIVE Sensitive     CEFTRIAXONE <=0.25 SENSITIVE Sensitive     CIPROFLOXACIN <=0.25 SENSITIVE Sensitive     GENTAMICIN <=1 SENSITIVE Sensitive     IMIPENEM 4 SENSITIVE Sensitive     TRIMETH/SULFA <=20 SENSITIVE Sensitive     AMPICILLIN/SULBACTAM <=2 SENSITIVE Sensitive     PIP/TAZO <=4 SENSITIVE Sensitive     * PROTEUS MIRABILIS  Blood Culture ID Panel (Reflexed)     Status: Abnormal   Collection Time: 05/24/22  1:40 PM  Result Value Ref Range Status   Enterococcus faecalis NOT DETECTED NOT DETECTED Final   Enterococcus Faecium NOT DETECTED NOT DETECTED Final   Listeria monocytogenes NOT DETECTED NOT DETECTED Final   Staphylococcus species NOT DETECTED NOT DETECTED Final   Staphylococcus aureus (BCID) NOT DETECTED NOT DETECTED Final   Staphylococcus epidermidis NOT DETECTED NOT DETECTED Final   Staphylococcus lugdunensis NOT DETECTED NOT DETECTED Final   Streptococcus species NOT DETECTED NOT  DETECTED Final   Streptococcus agalactiae NOT DETECTED NOT DETECTED Final   Streptococcus pneumoniae NOT DETECTED NOT DETECTED Final   Streptococcus pyogenes NOT DETECTED NOT DETECTED Final   A.calcoaceticus-baumannii NOT DETECTED NOT DETECTED Final   Bacteroides fragilis NOT DETECTED NOT DETECTED Final   Enterobacterales DETECTED (A) NOT DETECTED Final    Comment: Enterobacterales represent a large order of gram negative bacteria, not a single organism. CRITICAL RESULT CALLED TO, READ BACK BY AND VERIFIED WITH: PHARMD JESSICA M [phone] FCP    Enterobacter cloacae complex NOT DETECTED NOT DETECTED Final   Escherichia coli NOT DETECTED NOT DETECTED Final   Klebsiella aerogenes NOT DETECTED NOT DETECTED Final   Klebsiella oxytoca NOT DETECTED NOT DETECTED Final   Klebsiella pneumoniae NOT DETECTED NOT DETECTED Final   Proteus species DETECTED (A) NOT DETECTED Final    Comment: CRITICAL RESULT CALLED TO, READ BACK BY AND VERIFIED WITH: PHARMD JESSICA M [phone] FCP    Salmonella species NOT DETECTED NOT DETECTED Final   Serratia marcescens NOT DETECTED NOT DETECTED Final   Haemophilus influenzae NOT DETECTED NOT DETECTED Final   Neisseria meningitidis NOT DETECTED NOT DETECTED Final   Pseudomonas aeruginosa NOT DETECTED NOT DETECTED Final   Stenotrophomonas maltophilia NOT DETECTED NOT DETECTED Final   Candida albicans NOT DETECTED NOT DETECTED Final   Candida auris NOT DETECTED NOT DETECTED Final   Candida glabrata NOT DETECTED NOT DETECTED Final   Candida krusei NOT DETECTED NOT DETECTED Final   Candida parapsilosis NOT DETECTED NOT DETECTED Final   Candida tropicalis NOT DETECTED NOT DETECTED Final   Cryptococcus neoformans/gattii NOT DETECTED NOT DETECTED Final   CTX-M ESBL NOT DETECTED NOT DETECTED Final   Carbapenem resistance IMP NOT DETECTED NOT DETECTED Final   Carbapenem resistance KPC NOT DETECTED NOT DETECTED Final   Carbapenem resistance NDM NOT DETECTED NOT  DETECTED Final   Carbapenem resist OXA 48 LIKE NOT DETECTED NOT DETECTED Final   Carbapenem resistance VIM NOT DETECTED NOT DETECTED Final    Comment: Performed at [facility], [address], [place], [place] [address]  SARS Coronavirus 2 by RT PCR (hospital order, performed in [facility] hospital lab) *cepheid single result test*  Status: None   Collection Time: 05/24/22  1:46 PM   Specimen: Nasal Swab  Result Value Ref Range Status   SARS Coronavirus 2 by RT PCR NEGATIVE NEGATIVE Final    Comment: Performed at [facility], [address] [place], [place] [address]  Blood culture (routine x 2)     Status: None   Collection Time: 05/24/22  3:49 PM   Specimen: BLOOD  Result Value Ref Range Status   Specimen Description BLOOD BLOOD LEFT FOREARM  Final   Special Requests   Final    BOTTLES DRAWN AEROBIC AND ANAEROBIC Blood Culture results may not be optimal due to an inadequate volume of blood received in culture bottles   Culture   Final    NO GROWTH 5 DAYS Performed at [facility] Lab, [address], [place], [place] [address]    Report Status 05/29/2022 FINAL  Final  MRSA Next Gen by PCR, Nasal     Status: None   Collection Time: 05/25/22 12:01 PM   Specimen: Nasal Mucosa; Nasal Swab  Result Value Ref Range Status   MRSA by PCR Next Gen NOT DETECTED NOT DETECTED Final    Comment: (NOTE) The GeneXpert MRSA Assay (FDA approved for NASAL specimens only), is one component of a comprehensive MRSA colonization surveillance program. It is not intended to diagnose MRSA infection nor to guide or monitor treatment for MRSA infections. Test performance is not FDA approved in patients less than 61 years old. Performed at [facility] Lab, [address], [place], [place] [address]          Radiology Studies: No results found.      Scheduled Meds:  amLODipine  5 mg Oral Daily   arformoterol  15 mcg Nebulization BID   aspirin EC  81 mg Oral Daily    atorvastatin  20 mg Oral QHS   budesonide (PULMICORT) nebulizer solution  0.25 mg Nebulization BID   busPIRone  7.5 mg Oral TID   Chlorhexidine Gluconate Cloth  6 each Topical Daily   clopidogrel  75 mg Oral Daily   docusate sodium  100 mg Oral BID   enoxaparin (LOVENOX) injection  40 mg Subcutaneous Q24H   insulin aspart  0-15 Units Subcutaneous TID WC   lidocaine  2 patch Transdermal Q24H   methylPREDNISolone (SOLU-MEDROL) injection  125 mg Intravenous Once   pantoprazole  40 mg Oral QAC breakfast   polyethylene glycol  17 g Oral BID   predniSONE  10 mg Oral Q breakfast   revefenacin  175 mcg Nebulization Daily   sertraline  100 mg Oral Daily   sodium chloride flush  3 mL Intravenous Q12H   Continuous Infusions:  cefTRIAXone (ROCEPHIN)  IV 2 g (05/29/22 1445)   ondansetron (ZOFRAN) IV Stopped (05/28/22 1322)     LOS: 6 days       Huey Bienenstock, MD [facility]   To contact the attending provider between 7A-7P or the covering provider during after hours 7P-7A, please log into the web site [email or web address] and access using universal [place] password for that web site. If you do not have the password, please call the hospital operator.  05/30/2022, 12:32 PM

## 2022-05-30 NOTE — Progress Notes (Signed)
Refused NIV 

## 2022-05-31 ENCOUNTER — Other Ambulatory Visit (HOSPITAL_COMMUNITY): Payer: Self-pay

## 2022-05-31 DIAGNOSIS — N390 Urinary tract infection, site not specified: Secondary | ICD-10-CM | POA: Diagnosis not present

## 2022-05-31 DIAGNOSIS — J449 Chronic obstructive pulmonary disease, unspecified: Secondary | ICD-10-CM | POA: Diagnosis not present

## 2022-05-31 DIAGNOSIS — C3492 Malignant neoplasm of unspecified part of left bronchus or lung: Secondary | ICD-10-CM | POA: Diagnosis not present

## 2022-05-31 DIAGNOSIS — A419 Sepsis, unspecified organism: Secondary | ICD-10-CM | POA: Diagnosis not present

## 2022-05-31 LAB — CREATININE, SERUM
Creatinine, Ser: 1.14 mg/dL — ABNORMAL HIGH (ref 0.44–1.00)
GFR, Estimated: 50 mL/min — ABNORMAL LOW

## 2022-05-31 LAB — GLUCOSE, CAPILLARY: Glucose-Capillary: 180 mg/dL — ABNORMAL HIGH (ref 70–99)

## 2022-05-31 MED ORDER — CEPHALEXIN 500 MG PO CAPS
500.0000 mg | ORAL_CAPSULE | Freq: Three times a day (TID) | ORAL | 0 refills | Status: AC
  Filled 2022-05-31: qty 9, 3d supply, fill #0

## 2022-05-31 MED ORDER — PREDNISONE 10 MG PO TABS: ORAL_TABLET | ORAL | 0 refills | Status: DC

## 2022-05-31 MED ORDER — PREDNISONE 10 MG PO TABS
40.0000 mg | ORAL_TABLET | Freq: Every day | ORAL | 0 refills | Status: AC
  Filled 2022-05-31: qty 12, 3d supply, fill #0

## 2022-05-31 MED ORDER — SODIUM CHLORIDE 0.9 % IV SOLN
2.0000 g | Freq: Once | INTRAVENOUS | Status: AC
  Administered 2022-05-31: 2 g via INTRAVENOUS
  Filled 2022-05-31: qty 20

## 2022-05-31 MED ORDER — METHYLPREDNISOLONE SODIUM SUCC 40 MG IJ SOLR
40.0000 mg | Freq: Every day | INTRAMUSCULAR | Status: DC
  Administered 2022-05-31: 40 mg via INTRAVENOUS
  Filled 2022-05-31: qty 1

## 2022-05-31 NOTE — TOC Transition Note (Signed)
Transition of Care [facility]) - CM/SW Discharge Note   Patient Details  Name: Suzanne Dickson MRN: [id number] Date of Birth: [birth year]  [facility] CM/SW Contact:  Gordy Clement, RN Phone Number: 05/31/2022, 11:41 AM   Clinical Narrative:      Patient to DC to home with Spouse  today. No TOC needs were identified.  Patient is on HOT and had portable for transport.           Patient Goals and CMS Choice      Discharge Placement                         Discharge Plan and Services Additional resources added to the After Visit Summary for                                       Social Determinants of Health (SDOH) Interventions SDOH Screenings   Tobacco Use: Medium Risk (05/24/2022)     Readmission Risk Interventions     No data to display

## 2022-05-31 NOTE — Care Management Important Message (Signed)
Important Message  Patient Details  Name: Suzanne Dickson MRN: [id number] Date of Birth: 1945/08/15   Medicare Important Message Given:  Yes  Patient left prior to IM delivery will mail copy to the patient home address.    Lendy Dittrich 05/31/2022, 4:06 PM

## 2022-05-31 NOTE — Discharge Instructions (Signed)
Follow with Primary MD Dot Been, FNP in 7 days   Get CBC, CMP,  ray checked  by Primary MD next visit.    Activity: As tolerated with Full fall precautions use walker/cane & assistance as needed   Disposition Home    Diet: Heart Healthy    On your next visit with your primary care physician please Get Medicines reviewed and adjusted.   Please request your Prim.MD to go over all Hospital Tests and Procedure/Radiological results at the follow up, please get all Hospital records sent to your Prim MD by signing hospital release before you go home.   If you experience worsening of your admission symptoms, develop shortness of breath, life threatening emergency, suicidal or homicidal thoughts you must seek medical attention immediately by calling 911 or calling your MD immediately  if symptoms less severe.  You Must read complete instructions/literature along with all the possible adverse reactions/side effects for all the Medicines you take and that have been prescribed to you. Take any new Medicines after you have completely understood and accpet all the possible adverse reactions/side effects.   Do not drive, operating heavy machinery, perform activities at heights, swimming or participation in water activities or provide baby sitting services if your were admitted for syncope or siezures until you have seen by Primary MD or a Neurologist and advised to do so again.  Do not drive when taking Pain medications.    Do not take more than prescribed Pain, Sleep and Anxiety Medications  Special Instructions: If you have smoked or chewed Tobacco  in the last 2 yrs please stop smoking, stop any regular Alcohol  and or any Recreational drug use.  Wear Seat belts while driving.   Please note  You were cared for by a hospitalist during your hospital stay. If you have any questions about your discharge medications or the care you received while you were in the hospital after you are  discharged, you can call the unit and asked to speak with the hospitalist on call if the hospitalist that took care of you is not available. Once you are discharged, your primary care physician will handle any further medical issues. Please note that NO REFILLS for any discharge medications will be authorized once you are discharged, as it is imperative that you return to your primary care physician (or establish a relationship with a primary care physician if you do not have one) for your aftercare needs so that they can reassess your need for medications and monitor your lab values.

## 2022-05-31 NOTE — Plan of Care (Signed)

## 2022-05-31 NOTE — Progress Notes (Signed)
Occupational Therapy Treatment Patient Details Name: Suzanne Dickson MRN: [id number] DOB: Nov 24, 1945 Today's Date: 05/31/2022   History of present illness Pt is 77 year old presented to [facility] on  4/29 for acute on chronic hypoxic resp failure requiring bipap. PMH - COPD with chronic respiratory failure on 6-8 L O2 at home, CAD, HTN, h/o adenocarcinoma on the left lung, OSA   OT comments  Patient received in supine and stated he had wet bed and needed to use BSC. Patient able to get to EOB with increased time and required increased time before attempting transfer to [facility]. Patient was min guard for transfer to [facility] and able to perform toilet hygiene and donned pull up brief and required seated break before attempting transfer to recliner and asked to increase O2 from 5 liters to 6. Patient provided handout for energy conservation strategies and reviewed with patient. Acute OT to continue to follow.    Recommendations for follow up therapy are one component of a multi-disciplinary discharge planning process, led by the attending physician.  Recommendations may be updated based on patient status, additional functional criteria and insurance authorization.    Assistance Recommended at Discharge PRN  Patient can return home with the following  A little help with bathing/dressing/bathroom;Assistance with cooking/housework   Equipment Recommendations  None recommended by OT    Recommendations for Other Services      Precautions / Restrictions Precautions Precautions: Fall;Other (comment) Precaution Comments: monitor O2 (6-8 L at baseline) and HR Restrictions Weight Bearing Restrictions: No       Mobility Bed Mobility Overal bed mobility: Needs Assistance Bed Mobility: Supine to Sit     Supine to sit: Supervision     General bed mobility comments: increased time and rest required once on EOB    Transfers Overall transfer level: Needs assistance Equipment used: Rolling walker (2  wheels) Transfers: Sit to/from Stand, Bed to chair/wheelchair/BSC Sit to Stand: Min guard     Step pivot transfers: Min guard     General transfer comment: transfer performed from EOB to [facility] and to recliner with patient requiring rest break following all transfers.     Balance Overall balance assessment: Needs assistance Sitting-balance support: Feet supported, No upper extremity supported Sitting balance-Leahy Scale: Good     Standing balance support: Single extremity supported, Bilateral upper extremity supported, During functional activity Standing balance-Leahy Scale: Poor Standing balance comment: singe extremity support due toilet hygiene                           ADL either performed or assessed with clinical judgement   ADL Overall ADL's : Needs assistance/impaired                     Lower Body Dressing: Min guard;Sit to/from stand;Sitting/lateral leans Lower Body Dressing Details (indicate cue type and reason): to donn pull up brief Toilet Transfer: Min guard;BSC/3in1;Rolling walker (2 wheels)   Toileting- Clothing Manipulation and Hygiene: Min guard;Sitting/lateral lean;Sit to/from stand Toileting - Clothing Manipulation Details (indicate cue type and reason): standing with RW            Extremity/Trunk Assessment              Vision       Perception     Praxis      Cognition Arousal/Alertness: Awake/alert Behavior During Therapy: WFL for tasks assessed/performed Overall Cognitive Status: Within Functional Limits for tasks assessed  Exercises      Shoulder Instructions       General Comments patient on 5 liters and increased to 6 during visit due to SOB with SpO2 88-92    Pertinent Vitals/ Pain       Pain Assessment Pain Assessment: Faces Faces Pain Scale: Hurts a little bit Pain Location: back Pain Descriptors / Indicators: Aching, Grimacing, Sore Pain  Intervention(s): Limited activity within patient's tolerance, Monitored during session, Repositioned  Home Living                                          Prior Functioning/Environment              Frequency  Min 2X/week        Progress Toward Goals  OT Goals(current goals can now be found in the care plan section)  Progress towards OT goals: Progressing toward goals  Acute Rehab OT Goals Patient Stated Goal: go home OT Goal Formulation: With patient Time For Goal Achievement: 06/10/22 Potential to Achieve Goals: Good ADL Goals Pt/caregiver will Perform Home Exercise Program: Increased strength;Both right and left upper extremity;With theraband;Independently;With written HEP provided Additional ADL Goal #1: Pt to independently implement at least 3 energy conservation strategies during ADLs/functional mobility  Plan Discharge plan remains appropriate    Co-evaluation                 AM-PAC OT "6 Clicks" Daily Activity     Outcome Measure   Help from another person eating meals?: None Help from another person taking care of personal grooming?: A Little Help from another person toileting, which includes using toliet, bedpan, or urinal?: A Little Help from another person bathing (including washing, rinsing, drying)?: A Little Help from another person to put on and taking off regular upper body clothing?: A Little Help from another person to put on and taking off regular lower body clothing?: A Little 6 Click Score: 19    End of Session Equipment Utilized During Treatment: Rolling walker (2 wheels);Oxygen (5-6 liters)  OT Visit Diagnosis: Unsteadiness on feet (R26.81)   Activity Tolerance Patient tolerated treatment well   Patient Left in chair;with call bell/phone within reach   Nurse Communication Mobility status        Time: 1610-9604 OT Time Calculation (min): 30 min  Charges: OT General Charges $OT Visit: 1 Visit OT  Treatments $Self Care/Home Management : 23-37 mins  Alfonse Flavors, OTA Acute Rehabilitation Services  Office [phone]   Dewain Penning 05/31/2022, 12:32 PM

## 2022-05-31 NOTE — Discharge Summary (Signed)
Physician Discharge Summary  Deyja Savino Tauzin NWG:[id number] DOB: [birth year] DOA: 05/24/2022  PCP: Dot Been, FNP  Admit date: 05/24/2022 Discharge date: 05/31/2022  Admitted From: (Home) Disposition:  (Home)  Recommendations for Outpatient Follow-up:  Follow up with PCP in 1-2 weeks Please obtain BMP/CBC in one week    Discharge Condition: (Stable) CODE STATUS: ( DNR, SEEN  by palliative) Diet recommendation: Heart Healthy   Brief/Interim Summary:  Patient is a 77 y.o.  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  Patient was transferred to ICU on 4/30, secondary to increased work of breathing, she is transferred back to [facility] 06/24/2022 as her respiratory status has improved.   Significant events: 4/29>> admit to [facility]-sepsis-presumed pyelonephritis.  Post admission-developed SOB-placed on BiPAP. 4/30>> blood cultures positive for Proteus.  Failed liberation of BiPAP.  Transferred to ICU 5/30>>transferred out of ICU   Significant studies: 4/29>> CT head: No acute intracranial abnormality 4/29>> CT abdomen/pelvis: Motion artifact-left mid kidney with stranding/enhancement. 4/30>> CXR: Increased interstitial opacities-pulm edema versus atypical infection.   Significant microbiology data: 4/29>> COVID PCR: Negative 4/29>> blood culture: Proteus mirabilis, pansensitive  Severe sepsis secondary to left pyelonephritis and Proteus bacteremia -Severe sepsis present on admission -Workup significant for Proteus bacteremia, treated with IV Rocephin during hospital stay, she will be discharged on Keflex for total of 10 days treatment.   Acute on chronic hypoxic respiratory failure (on 6-8 L of oxygen at  baseline) -Requiring BiPAP support, she was transferred to ICU, weaned off BiPAP.  At baseline 6 to 8 L nasal cannula - She was encouraged to use incentive spirometry and flutter valve   COPD exacerbation -With some wheezing, treated with IV steroids, she will be discharged on increased dose prednisone for next 3 days, then back on home dose prednisone 10 mg oral daily.     Acute metabolic encephalopathy Seems to have improved-likely secondary to sepsis physiology CT head negative Mentation back to baseline  Macrocytic anemia Hemoglobin at baseline  Thrombocytopenia Due to sepsis physiology Mild   COPD-chronic hypoxia-6-8 L of oxygen at baseline On daily prednisone   HTN Continue with amlodipine   CKD stage IIIb Close to baseline-mildly elevated creatinine   OSA per spouse-noncompliant with CPAP at home   Stage Ia non-small cell cancer of the lung  Follows with Dr. Arbutus Ped, suspicion for recurrence, following with Dr. Arbutus Ped with plan for PET scan as an outpatient, which has been done 4/25, unfortunately it does show suspicion for recurrence of malignancy. Marland Kitchen   BMI: Estimated body mass index is 29.84 kg/m as calculated from the following:   Height as of this encounter: 5\' 2"  (1.575 m).   Weight as of this encounter: 74 kg.    Goals of care -Palliative  medicine consulted, they have tried to arrange meeting with the husband, patient is clear that she wishes for DNR, this has been confirmed during hospital stay, patient is currently DNR.    Discharge Diagnoses:  Principal Problem:   Sepsis secondary to UTI [facility]) Active Problems:   Essential hypertension   COPD GOLD III/ 02 dep    Adenocarcinoma of left lung, stage 1 (HCC)   OSA on CPAP   CAD (coronary artery disease)   Macrocytic anemia   Sepsis (HCC)  Discharge Instructions  Discharge Instructions     Diet - low sodium heart healthy   Complete by: As directed    Discharge instructions   Complete by:  As directed    Follow with Primary MD Dot Been, FNP in 7 days   Get CBC, CMP,  ray checked  by Primary MD next visit.    Activity: As tolerated with Full fall precautions use walker/cane & assistance as needed   Disposition Home    Diet: Heart Healthy    On your next visit with your primary care physician please Get Medicines reviewed and adjusted.   Please request your Prim.MD to go over all Hospital Tests and Procedure/Radiological results at the follow up, please get all Hospital records sent to your Prim MD by signing hospital release before you go home.   If you experience worsening of your admission symptoms, develop shortness of breath, life threatening emergency, suicidal or homicidal thoughts you must seek medical attention immediately by calling 911 or calling your MD immediately  if symptoms less severe.  You Must read complete instructions/literature along with all the possible adverse reactions/side effects for all the Medicines you take and that have been prescribed to you. Take any new Medicines after you have completely understood and accpet all the possible adverse reactions/side effects.   Do not drive, operating heavy machinery, perform activities at heights, swimming or participation in water activities or provide baby sitting services if your were admitted for syncope or siezures until you have seen by Primary MD or a Neurologist and advised to do so again.  Do not drive when taking Pain medications.    Do not take more than prescribed Pain, Sleep and Anxiety Medications  Special Instructions: If you have smoked or chewed Tobacco  in the last 2 yrs please stop smoking, stop any regular Alcohol  and or any Recreational drug use.  Wear Seat belts while driving.   Please note  You were cared for by a hospitalist during your hospital stay. If you have any questions about your discharge medications or the care you received while you were in the hospital  after you are discharged, you can call the unit and asked to speak with the hospitalist on call if the hospitalist that took care of you is not available. Once you are discharged, your primary care physician will handle any further medical issues. Please note that NO REFILLS for any discharge medications will be authorized once you are discharged, as it is imperative that you return to your primary care physician (or establish a relationship with a primary care physician if you do not have one) for your aftercare needs so that they can reassess your need for medications and monitor your lab values.   Increase activity slowly   Complete by: As directed       Allergies as of 05/31/2022   No Known Allergies      Medication List     STOP taking these medications    azithromycin 250 MG tablet Commonly known as: ZITHROMAX   ibuprofen 200 MG tablet Commonly known as: ADVIL   naproxen 250 MG tablet Commonly known as: NAPROSYN       TAKE these medications    acetaminophen 500 MG tablet Commonly known as: TYLENOL Take 1 tablet (500 mg total) by mouth every 6 (six) hours as needed.   albuterol (2.5 MG/3ML) 0.083% nebulizer solution Commonly known as: PROVENTIL Use every 4 hours and as needed for shortness  of breath.   albuterol 108 (90 Base) MCG/ACT inhaler Commonly known as: VENTOLIN HFA INHALE 2 PUFFS BY MOUTH EVERY 4 HOURS AS NEEDED FOR WHEEZING FOR SHORTNESS OF BREATH   amLODipine 5 MG tablet Commonly known as: NORVASC Take 5 mg by mouth daily.   aspirin EC 81 MG tablet Take 81 mg by mouth daily. Swallow whole.   atorvastatin 20 MG tablet Commonly known as: LIPITOR Take 20 mg by mouth at bedtime.   Azelastine-Fluticasone 137-50 MCG/ACT Susp 2 PUFFS INTO EACH NOSTRIL AT BEDTIME   B-12 PO Take 1 tablet by mouth daily.   benzonatate 200 MG capsule Commonly known as: TESSALON Take 1 capsule (200 mg total) by mouth 3 (three) times daily as needed for cough.    busPIRone 7.5 MG tablet Commonly known as: BUSPAR Take 7.5 mg by mouth 3 (three) times daily.   cephALEXin 500 MG capsule Commonly known as: KEFLEX Take 1 capsule (500 mg total) by mouth 3 (three) times daily for 3 days.   cetirizine 10 MG tablet Commonly known as: ZYRTEC Take 10 mg by mouth at bedtime.   cholecalciferol 25 MCG (1000 UNIT) tablet Commonly known as: VITAMIN D3 Take 1,000 Units by mouth daily.   clopidogrel 75 MG tablet Commonly known as: PLAVIX Take 75 mg by mouth every morning.   empagliflozin 10 MG Tabs tablet Commonly known as: JARDIANCE Take 10 mg by mouth daily.   famotidine 20 MG tablet Commonly known as: Pepcid One after supper   ferrous sulfate 325 (65 FE) MG EC tablet Take 325 mg by mouth 3 (three) times daily with meals.   hydrochlorothiazide 12.5 MG capsule Commonly known as: MICROZIDE Take 12.5 mg by mouth daily.   lidocaine 4 % Apply 1 patch topically 2 (two) times daily.   losartan 50 MG tablet Commonly known as: COZAAR Take 50 mg by mouth every morning.   methocarbamol 500 MG tablet Commonly known as: ROBAXIN Take by mouth.   metoprolol succinate 25 MG 24 hr tablet Commonly known as: TOPROL-XL Take 25 mg by mouth daily.   nitroGLYCERIN 0.4 MG SL tablet Commonly known as: NITROSTAT Place 0.4 mg under the tongue every 5 (five) minutes as needed for chest pain.   nitroGLYCERIN 0.4 mg/hr patch Commonly known as: NITRODUR - Dosed in mg/24 hr Place 0.4 mg onto the skin every morning.   OVER THE COUNTER MEDICATION Take 1 tablet by mouth at bedtime. OTC sleep aid - unknown ingredients   OVER THE COUNTER MEDICATION Take 3,000 mcg by mouth daily. Vitamin B12 tablet daily   OXYGEN Inhale 4-6 L into the lungs continuous.   pantoprazole 40 MG tablet Commonly known as: PROTONIX Take 30-60 min before first meal of the day What changed:  how much to take how to take this when to take this additional instructions    predniSONE 10 MG tablet Commonly known as: DELTASONE Take 4 tablets (40 mg total) by mouth daily for 3 days. What changed: You were already taking a medication with the same name, and this prescription was added. Make sure you understand how and when to take each.   predniSONE 10 MG tablet Commonly known as: DELTASONE Start on 5/10 Start taking on: Jun 04, 2022 What changed:  See the new instructions. These instructions start on Jun 04, 2022. If you are unsure what to do until then, ask your doctor or other care provider.   REFRESH OP Place 1 drop into both eyes daily as needed (dry eyes).  sertraline 100 MG tablet Commonly known as: ZOLOFT Take 100 mg by mouth daily.   tiZANidine 4 MG tablet Commonly known as: Zanaflex Take 1 tablet (4 mg total) by mouth every 8 (eight) hours as needed for muscle spasms.   Trelegy Ellipta 100-62.5-25 MCG/ACT Aepb Generic drug: Fluticasone-Umeclidin-Vilant INHALE 1 PUFF ONCE DAILY        No Known Allergies  Consultations: PCCM   Procedures/Studies: ECHOCARDIOGRAM COMPLETE  Result Date: 05/26/2022    ECHOCARDIOGRAM REPORT   Patient Name:   Janiqua Cabazos Barron Date of Exam: 05/26/2022 Medical Rec #:  [id number]       Height:       62.0 in Accession #:    [id number]      Weight:       163.1 lb Date of Birth:  1945/03/29      BSA:          1.753 m Patient Age:    76 years        BP:           163/93 mmHg Patient Gender: F               HR:           124 bpm. Exam Location:  Inpatient Procedure: 2D Echo, Color Doppler and Cardiac Doppler Indications:    I50.31 Acute diastolic (congestive) heart failure  History:        Patient has prior history of Echocardiogram examinations. CHF,                 CAD; Pulmonary HTN and COPD.  Sonographer:    Mike Gip Referring Phys: Cheri Fowler IMPRESSIONS  1. Left ventricular ejection fraction, by estimation, is 65 to 70%. The left ventricle has normal function. The left ventricle has no regional wall motion  abnormalities. There is mild concentric left ventricular hypertrophy. Left ventricular diastolic parameters were normal.  2. Right ventricular systolic function is hyperdynamic. The right ventricular size is normal. There is moderately elevated pulmonary artery systolic pressure. The estimated right ventricular systolic pressure is 56.0 mmHg.  3. The mitral valve is grossly normal. Mild mitral valve regurgitation.  4. Tricuspid valve regurgitation is mild to moderate.  5. 2D Vena Contract 0.47 cm. The aortic valve is tricuspid. Aortic valve regurgitation is moderate.  6. The inferior vena cava is normal in size with greater than 50% respiratory variability, suggesting right atrial pressure of 3 mmHg. Comparison(s): LV is more vigorous from prior reporting. Unable to view prior study. FINDINGS  Left Ventricle: Left ventricular ejection fraction, by estimation, is 65 to 70%. The left ventricle has normal function. The left ventricle has no regional wall motion abnormalities. The left ventricular internal cavity size was small. There is mild concentric left ventricular hypertrophy. Left ventricular diastolic parameters were normal. Right Ventricle: The right ventricular size is normal. No increase in right ventricular wall thickness. Right ventricular systolic function is hyperdynamic. There is moderately elevated pulmonary artery systolic pressure. The tricuspid regurgitant velocity is 3.64 m/s, and with an assumed right atrial pressure of 3 mmHg, the estimated right ventricular systolic pressure is 56.0 mmHg. Left Atrium: Left atrial size was normal in size. Right Atrium: Right atrial size was normal in size. Pericardium: There is no evidence of pericardial effusion. Mitral Valve: The mitral valve is grossly normal. Mild mitral valve regurgitation. Tricuspid Valve: The tricuspid valve is normal in structure. Tricuspid valve regurgitation is mild to moderate. No evidence of tricuspid stenosis. Aortic Valve: 2D  Vena  Contract 0.47 cm. The aortic valve is tricuspid. Aortic valve regurgitation is moderate. Aortic regurgitation PHT measures 220 msec. Pulmonic Valve: The pulmonic valve was normal in structure. Pulmonic valve regurgitation is not visualized. No evidence of pulmonic stenosis. Aorta: The aortic root and ascending aorta are structurally normal, with no evidence of dilitation. Venous: The inferior vena cava is normal in size with greater than 50% respiratory variability, suggesting right atrial pressure of 3 mmHg. IAS/Shunts: No atrial level shunt detected by color flow Doppler.  LEFT VENTRICLE PLAX 2D LVIDd:         3.70 cm     Diastology LVIDs:         2.30 cm     LV e' medial:    14.00 cm/s LV PW:         1.10 cm     LV E/e' medial:  9.4 LV IVS:        1.10 cm     LV e' lateral:   18.50 cm/s LVOT diam:     1.90 cm     LV E/e' lateral: 7.1 LV SV:         54 LV SV Index:   31 LVOT Area:     2.84 cm  LV Volumes (MOD) LV vol d, MOD A2C: 67.6 ml LV vol d, MOD A4C: 75.2 ml LV vol s, MOD A2C: 23.8 ml LV vol s, MOD A4C: 28.5 ml LV SV MOD A2C:     43.8 ml LV SV MOD A4C:     75.2 ml LV SV MOD BP:      46.7 ml RIGHT VENTRICLE             IVC RV Basal diam:  3.70 cm     IVC diam: 1.60 cm RV S prime:     17.30 cm/s TAPSE (M-mode): 1.8 cm LEFT ATRIUM           Index        RIGHT ATRIUM           Index LA diam:      3.30 cm 1.88 cm/m   RA Area:     14.00 cm LA Vol (A2C): 34.5 ml 19.68 ml/m  RA Volume:   35.30 ml  20.14 ml/m LA Vol (A4C): 37.7 ml 21.50 ml/m  AORTIC VALVE LVOT Vmax:   118.00 cm/s LVOT Vmean:  67.100 cm/s LVOT VTI:    0.190 m AI PHT:      220 msec  AORTA Ao Root diam: 3.00 cm Ao Asc diam:  2.90 cm MITRAL VALVE                TRICUSPID VALVE MV Area (PHT): 6.65 cm     TR Peak grad:   53.0 mmHg MV Decel Time: 114 msec     TR Vmax:        364.00 cm/s MV E velocity: 132.00 cm/s                             SHUNTS                             Systemic VTI:  0.19 m                             Systemic Diam: 1.90 cm  Riley Lam MD  Electronically signed by Riley Lam MD Signature Date/Time: 05/26/2022/8:52:14 AM    Final    DG Chest 1 View  Result Date: 05/25/2022 CLINICAL DATA:  10026 Shortness of breath 10026 EXAM: CHEST  1 VIEW COMPARISON:  May 24, 2022, September 17, 2020 FINDINGS: The cardiomediastinal silhouette is unchanged in contour.Unchanged irregular LEFT perihilar contour with clips consistent with history of malignancy status post radiation. No pleural effusion. No pneumothorax. Increased bibasilar predominant interstitial opacities. Atherosclerotic calcifications. IMPRESSION: Increased bibasilar predominant interstitial opacities. Differential considerations include pulmonary edema versus atypical infection. Electronically Signed   By: Meda Klinefelter M.D.   On: 05/25/2022 08:49   CT ABDOMEN PELVIS W CONTRAST  Result Date: 05/24/2022 CLINICAL DATA:  Status post XRT left-sided lung cancer. Right lower quadrant pain, vomiting and sepsis. EXAM: CT ABDOMEN AND PELVIS WITH CONTRAST TECHNIQUE: Multidetector CT imaging of the abdomen and pelvis was performed using the standard protocol following bolus administration of intravenous contrast. RADIATION DOSE REDUCTION: This exam was performed according to the departmental dose-optimization program which includes automated exposure control, adjustment of the mA and/or kV according to patient size and/or use of iterative reconstruction technique. CONTRAST:  60mL OMNIPAQUE IOHEXOL 350 MG/ML SOLN COMPARISON:  PET-CT 05/20/2022 FINDINGS: Lower chest: Breathing motion at the lung bases. Mild scar or atelectasis. No pleural effusion. Coronary artery calcifications are seen. Coronary artery calcifications are seen. Small hiatal hernia. Hepatobiliary: Fatty liver infiltration. No obvious space-occupying liver lesion. Patent portal vein. Gallbladder is surgically absent Pancreas: Unremarkable. No pancreatic ductal dilatation or surrounding inflammatory  changes. Spleen: Normal in size without focal abnormality. Adrenals/Urinary Tract: Adrenal glands are preserved. Multiple bilateral renal cysts are identified. Largest is seen upper pole on the right measuring 5.8 cm in diameter and Hounsfield unit of 10. Lower pole focus on the left as another example measures diameter 3.8 cm and has Hounsfield units of 7. Few other smaller foci identified. These are Bosniak 1 and 2 lesions. No specific imaging follow-up. However there is an ill-defined area in the anterior aspect of the left kidney best seen on delayed series 13, image 14 measuring 19 mm. Hounsfield units of 105 on delayed and 99 on portal venous phase. Margins are somewhat difficult to define as there is motion throughout the examination. There is some perinephric stranding. Based on appearance this could represent edema of the parenchyma and an area of pyelonephritis with the stranding. Please correlate with clinical presentation. Preserved contours of the urinary bladder. Stomach/Bowel: Sigmoid colon diverticula. Scattered colonic stool. Normal appendix. Stomach and small bowel are nondilated. Vascular/Lymphatic: Extensive vascular calcifications identified along the aorta and branch vessels with areas of potential stenosis including the iliac vessels, SMA and renal arteries. Please correlate with the history. No specific abnormal lymph node enlargement present in the abdomen and pelvis. Reproductive: Uterus and bilateral adnexa are unremarkable. Other: No abdominal wall hernia or abnormality. No abdominopelvic ascites. Musculoskeletal: Trace anterolisthesis of L3 on L4. Multilevel compression deformities along the spine with the margins are sclerotic. Favor a more chronic process. IMPRESSION: Study limited by motion throughout the examination. There is some ill-defined area enhancement along the left mid kidney with stranding. Possibilities would include an area of pyelonephritis. Please correlate with  history. Otherwise would recommend follow up evaluation when the patient is more able to tolerate the study without motion. Separate bilateral benign-appearing renal cysts. No bowel obstruction.  Colonic diverticulosis. Multilevel compression deformities along the spine but the margins are somewhat sclerotic. Favor more of a chronic process. Please correlate with  the history. Extensive vascular calcifications including the aorta and branch vessels. Please correlate with any particular symptoms. Electronically Signed   By: Karen Kays M.D.   On: 05/24/2022 15:34   CT Head Wo Contrast  Result Date: 05/24/2022 CLINICAL DATA:  Altered mental status EXAM: CT HEAD WITHOUT CONTRAST TECHNIQUE: Contiguous axial images were obtained from the base of the skull through the vertex without intravenous contrast. RADIATION DOSE REDUCTION: This exam was performed according to the departmental dose-optimization program which includes automated exposure control, adjustment of the mA and/or kV according to patient size and/or use of iterative reconstruction technique. COMPARISON:  Paranasal sinus CT 11/30/2017 and CT head 03/28/2005 FINDINGS: Brain: No intracranial hemorrhage, mass effect, or evidence of acute infarct. No hydrocephalus. No extra-axial fluid collection. Generalized cerebral atrophy. Ill-defined hypoattenuation within the cerebral white matter is nonspecific but consistent with chronic small vessel ischemic disease. Physiologic basal ganglia calcification. Vascular: No hyperdense vessel. Intracranial arterial calcification. Skull: No fracture or focal lesion. Sinuses/Orbits: No acute finding. Other: None. IMPRESSION: 1. No evidence of acute intracranial abnormality. 2. Chronic small vessel ischemic disease and cerebral atrophy. Electronically Signed   By: Minerva Fester M.D.   On: 05/24/2022 15:25   DG Chest Portable 1 View  Result Date: 05/24/2022 CLINICAL DATA:  Altered mental status.  Sepsis workup. EXAM:  PORTABLE CHEST 1 VIEW COMPARISON:  CT chest 04/20/2022 FINDINGS: The cardiomediastinal silhouette is stable. Bandlike opacity in the left perihilar region with associated architectural distortion is similar to the prior study. The lingular nodule seen on the recent PET-CT is not well appreciated on the current study. There is no new or worsening focal airspace disease. There is no pulmonary edema. There is no pleural effusion or pneumothorax There is no acute osseous abnormality. IMPRESSION: No radiographic evidence of acute cardiopulmonary process. Electronically Signed   By: Lesia Hausen M.D.   On: 05/24/2022 14:15   NM PET Image Restage (PS) Skull Base to Thigh (F-18 FDG)  Result Date: 05/24/2022 CLINICAL DATA:  Subsequent treatment strategy for left lung cancer, status post XRT. EXAM: NUCLEAR MEDICINE PET SKULL BASE TO THIGH TECHNIQUE: 8.0 mCi F-18 FDG was injected intravenously. Full-ring PET imaging was performed from the skull base to thigh after the radiotracer. CT data was obtained and used for attenuation correction and anatomic localization. Fasting blood glucose: 127 mg/dl COMPARISON:  CT chest dated 04/20/2022 FINDINGS: Mediastinal blood pool activity: SUV max 2.6 Liver activity: SUV max NA NECK: No hypermetabolic cervical lymphadenopathy. Incidental CT findings: None. CHEST: Radiation changes in the central left upper lobe/perihilar region. Underlying 13 mm lingular nodule (series 7/image 35), max SUV 5.5, suspicious for recurrence. No hypermetabolic thoracic lymphadenopathy. Incidental CT findings: Atherosclerotic calcifications of the aortic arch. Severe three-vessel coronary atherosclerosis. ABDOMEN/PELVIS: No abnormal hypermetabolism in the liver, spleen, pancreas, or adrenal glands. No hypermetabolic abdominopelvic lymphadenopathy. Incidental CT findings: Status post cholecystectomy. Bilateral renal cysts measuring up to 5.6 cm in the right upper pole (series 4/image 105), benign (Bosniak I).  No follow-up is recommended. Atherosclerotic calcifications the abdominal aorta and branch vessels. Extensive sigmoid diverticulosis, without evidence of diverticulitis. SKELETON: No focal hypermetabolic activity to suggest skeletal metastasis. Incidental CT findings: Degenerative changes of the right shoulder. IMPRESSION: Radiation changes in the left hemithorax. Underlying 13 mm lingular nodule, suspicious for recurrence. Additional ancillary findings as above. Electronically Signed   By: Charline Bills M.D.   On: 05/24/2022 03:53      Subjective:  No significant events overnight, she denies any complaints, eager  to go home today, as well hospital at bedside, eager for her to go home, Discharge Exam: Vitals:   05/31/22 0400 05/31/22 0801  BP:  (!) 156/97  Pulse:  (!) 114  Resp:  (!) 23  Temp: 98.6 F (37 C) 97.8 F (36.6 C)  SpO2:  97%   Vitals:   05/31/22 0005 05/31/22 0010 05/31/22 0400 05/31/22 0801  BP: (!) 151/91   (!) 156/97  Pulse:  95  (!) 114  Resp:  20  (!) 23  Temp: 97.7 F (36.5 C)  98.6 F (37 C) 97.8 F (36.6 C)  TempSrc: Oral  Oral Oral  SpO2:    97%  Weight:      Height:        General: Pt is alert, awake, not in acute distress, frail, deconditioned and chronically ill-appearing Cardiovascular: RRR, S1/S2 +, no rubs, no gallops Respiratory: Fair air entry bilaterally, mild end expiratory wheeze Abdominal: Soft, NT, ND, bowel sounds + Extremities: no edema, no cyanosis    The results of significant diagnostics from this hospitalization (including imaging, microbiology, ancillary and laboratory) are listed below for reference.     Microbiology: Recent Results (from the past 240 hour(s))  Blood culture (routine x 2)     Status: Abnormal   Collection Time: 05/24/22  1:40 PM   Specimen: BLOOD RIGHT HAND  Result Value Ref Range Status   Specimen Description BLOOD RIGHT HAND  Final   Special Requests   Final    BOTTLES DRAWN AEROBIC AND ANAEROBIC  Blood Culture results may not be optimal due to an excessive volume of blood received in culture bottles   Culture  Setup Time   Final    GRAM NEGATIVE RODS IN BOTH AEROBIC AND ANAEROBIC BOTTLES CRITICAL RESULT CALLED TO, READ BACK BY AND VERIFIED WITH: PHARMD JESSICA Judie Petit [id number] FCP Performed at [facility], [address], [place], [place] [address]    Culture PROTEUS MIRABILIS (A)  Final   Report Status 05/27/2022 FINAL  Final   Organism ID, Bacteria PROTEUS MIRABILIS  Final      Susceptibility   Proteus mirabilis - MIC*    AMPICILLIN <=2 SENSITIVE Sensitive     CEFEPIME <=0.12 SENSITIVE Sensitive     CEFTAZIDIME <=1 SENSITIVE Sensitive     CEFTRIAXONE <=0.25 SENSITIVE Sensitive     CIPROFLOXACIN <=0.25 SENSITIVE Sensitive     GENTAMICIN <=1 SENSITIVE Sensitive     IMIPENEM 4 SENSITIVE Sensitive     TRIMETH/SULFA <=20 SENSITIVE Sensitive     AMPICILLIN/SULBACTAM <=2 SENSITIVE Sensitive     PIP/TAZO <=4 SENSITIVE Sensitive     * PROTEUS MIRABILIS  Blood Culture ID Panel (Reflexed)     Status: Abnormal   Collection Time: 05/24/22  1:40 PM  Result Value Ref Range Status   Enterococcus faecalis NOT DETECTED NOT DETECTED Final   Enterococcus Faecium NOT DETECTED NOT DETECTED Final   Listeria monocytogenes NOT DETECTED NOT DETECTED Final   Staphylococcus species NOT DETECTED NOT DETECTED Final   Staphylococcus aureus (BCID) NOT DETECTED NOT DETECTED Final   Staphylococcus epidermidis NOT DETECTED NOT DETECTED Final   Staphylococcus lugdunensis NOT DETECTED NOT DETECTED Final   Streptococcus species NOT DETECTED NOT DETECTED Final   Streptococcus agalactiae NOT DETECTED NOT DETECTED Final   Streptococcus pneumoniae NOT DETECTED NOT DETECTED Final   Streptococcus pyogenes NOT DETECTED NOT DETECTED Final   A.calcoaceticus-baumannii NOT DETECTED NOT DETECTED Final   Bacteroides fragilis NOT DETECTED NOT DETECTED Final  Enterobacterales DETECTED (A) NOT DETECTED Final     Comment: Enterobacterales represent a large order of gram negative bacteria, not a single organism. CRITICAL RESULT CALLED TO, READ BACK BY AND VERIFIED WITH: PHARMD JESSICA M [phone] FCP    Enterobacter cloacae complex NOT DETECTED NOT DETECTED Final   Escherichia coli NOT DETECTED NOT DETECTED Final   Klebsiella aerogenes NOT DETECTED NOT DETECTED Final   Klebsiella oxytoca NOT DETECTED NOT DETECTED Final   Klebsiella pneumoniae NOT DETECTED NOT DETECTED Final   Proteus species DETECTED (A) NOT DETECTED Final    Comment: CRITICAL RESULT CALLED TO, READ BACK BY AND VERIFIED WITH: PHARMD JESSICA M [id number] FCP    Salmonella species NOT DETECTED NOT DETECTED Final   Serratia marcescens NOT DETECTED NOT DETECTED Final   Haemophilus influenzae NOT DETECTED NOT DETECTED Final   Neisseria meningitidis NOT DETECTED NOT DETECTED Final   Pseudomonas aeruginosa NOT DETECTED NOT DETECTED Final   Stenotrophomonas maltophilia NOT DETECTED NOT DETECTED Final   Candida albicans NOT DETECTED NOT DETECTED Final   Candida auris NOT DETECTED NOT DETECTED Final   Candida glabrata NOT DETECTED NOT DETECTED Final   Candida krusei NOT DETECTED NOT DETECTED Final   Candida parapsilosis NOT DETECTED NOT DETECTED Final   Candida tropicalis NOT DETECTED NOT DETECTED Final   Cryptococcus neoformans/gattii NOT DETECTED NOT DETECTED Final   CTX-M ESBL NOT DETECTED NOT DETECTED Final   Carbapenem resistance IMP NOT DETECTED NOT DETECTED Final   Carbapenem resistance KPC NOT DETECTED NOT DETECTED Final   Carbapenem resistance NDM NOT DETECTED NOT DETECTED Final   Carbapenem resist OXA 48 LIKE NOT DETECTED NOT DETECTED Final   Carbapenem resistance VIM NOT DETECTED NOT DETECTED Final    Comment: Performed at [facility], [address] [place], [place] [address]  SARS Coronavirus 2 by RT PCR (hospital order, performed in [facility] hospital lab) *cepheid single result test*     Status: None    Collection Time: 05/24/22  1:46 PM   Specimen: Nasal Swab  Result Value Ref Range Status   SARS Coronavirus 2 by RT PCR NEGATIVE NEGATIVE Final    Comment: Performed at [facility], [address] [place], [place] [address]  Blood culture (routine x 2)     Status: None   Collection Time: 05/24/22  3:49 PM   Specimen: BLOOD  Result Value Ref Range Status   Specimen Description BLOOD BLOOD LEFT FOREARM  Final   Special Requests   Final    BOTTLES DRAWN AEROBIC AND ANAEROBIC Blood Culture results may not be optimal due to an inadequate volume of blood received in culture bottles   Culture   Final    NO GROWTH 5 DAYS Performed at [facility], [address] [place], [place] [address]    Report Status 05/29/2022 FINAL  Final  MRSA Next Gen by PCR, Nasal     Status: None   Collection Time: 05/25/22 12:01 PM   Specimen: Nasal Mucosa; Nasal Swab  Result Value Ref Range Status   MRSA by PCR Next Gen NOT DETECTED NOT DETECTED Final    Comment: (NOTE) The GeneXpert MRSA Assay (FDA approved for NASAL specimens only), is one component of a comprehensive MRSA colonization surveillance program. It is not intended to diagnose MRSA infection nor to guide or monitor treatment for MRSA infections. Test performance is not FDA approved in patients less than 17 years old. Performed at [facility], [address] [place], [place] [address]      Labs: BNP (last 3 results) Recent Labs    05/24/22 1340  BNP 62.2   Basic Metabolic Panel: Recent Labs  Lab 05/24/22 1340 05/25/22 0553 05/25/22 2026 05/26/22 0305 05/28/22 0554  NA 136 140 139 136 139  K 3.7 4.2 3.7 3.6 3.7  CL 96* 102  --  98 102  CO2 26 24  --  23 28  GLUCOSE 153* 139*  --  189* 122*  BUN 23 17  --  29* 18  CREATININE 1.47* 1.47*  --  1.53* 1.18*  CALCIUM 8.9 8.9  --  8.6* 8.9   Liver Function Tests: Recent Labs  Lab 05/24/22 1340 05/25/22 0553 05/26/22 0305 05/28/22 0554  AST 39 34 24 21   ALT 36 30 24 22   ALKPHOS 64 65 60 45  BILITOT 0.8 0.7 0.8 0.6  PROT 6.6 6.3* 6.2* 6.1*  ALBUMIN 3.4* 3.0* 2.8* 2.8*   Recent Labs  Lab 05/24/22 1340  LIPASE 29   No results for input(s): "AMMONIA" in the last 168 hours. CBC: Recent Labs  Lab 05/24/22 1340 05/25/22 0553 05/25/22 2026 05/26/22 0305 05/28/22 0554  WBC 16.0* 18.6*  --  16.3* 7.3  NEUTROABS 14.1*  --   --   --   --   HGB 10.8* 10.3* 10.5* 10.4* 9.3*  HCT 36.6 33.6* 31.0* 33.7* 31.2*  MCV 104.0* 102.4*  --  100.9* 102.6*  PLT 117* 111*  --  130* 175   Cardiac Enzymes: No results for input(s): "CKTOTAL", "CKMB", "CKMBINDEX", "TROPONINI" in the last 168 hours. BNP: Invalid input(s): "POCBNP" CBG: Recent Labs  Lab 05/29/22 2133 05/30/22 0754 05/30/22 1235 05/30/22 1608 05/31/22 0801  GLUCAP 219* 171* 257* 269* 180*   D-Dimer No results for input(s): "DDIMER" in the last 72 hours. Hgb A1c No results for input(s): "HGBA1C" in the last 72 hours. Lipid Profile No results for input(s): "CHOL", "HDL", "LDLCALC", "TRIG", "CHOLHDL", "LDLDIRECT" in the last 72 hours. Thyroid function studies No results for input(s): "TSH", "T4TOTAL", "T3FREE", "THYROIDAB" in the last 72 hours.  Invalid input(s): "FREET3" Anemia work up No results for input(s): "VITAMINB12", "FOLATE", "FERRITIN", "TIBC", "IRON", "RETICCTPCT" in the last 72 hours. Urinalysis    Component Value Date/Time   COLORURINE STRAW (A) 05/24/2022 1531   APPEARANCEUR CLEAR 05/24/2022 1531   LABSPEC 1.013 05/24/2022 1531   PHURINE 7.0 05/24/2022 1531   GLUCOSEU >=500 (A) 05/24/2022 1531   HGBUR SMALL (A) 05/24/2022 1531   BILIRUBINUR NEGATIVE 05/24/2022 1531   KETONESUR NEGATIVE 05/24/2022 1531   PROTEINUR NEGATIVE 05/24/2022 1531   UROBILINOGEN 0.2 09/02/2014 2138   NITRITE POSITIVE (A) 05/24/2022 1531   LEUKOCYTESUR SMALL (A) 05/24/2022 1531   Sepsis Labs Recent Labs  Lab 05/24/22 1340 05/25/22 0553 05/26/22 0305 05/28/22 0554  WBC  16.0* 18.6* 16.3* 7.3   Microbiology Recent Results (from the past 240 hour(s))  Blood culture (routine x 2)     Status: Abnormal   Collection Time: 05/24/22  1:40 PM   Specimen: BLOOD RIGHT HAND  Result Value Ref Range Status   Specimen Description BLOOD RIGHT HAND  Final   Special Requests   Final    BOTTLES DRAWN AEROBIC AND ANAEROBIC Blood Culture results may not be optimal due to an excessive volume of blood received in culture bottles   Culture  Setup Time   Final    GRAM NEGATIVE RODS IN BOTH AEROBIC AND ANAEROBIC BOTTLES CRITICAL RESULT CALLED TO,  READ BACK BY AND VERIFIED WITH: Tawana Scale [id number] FCP Performed at [facility], [address], [place], [place] [address]    Culture PROTEUS MIRABILIS (A)  Final   Report Status 05/27/2022 FINAL  Final   Organism ID, Bacteria PROTEUS MIRABILIS  Final      Susceptibility   Proteus mirabilis - MIC*    AMPICILLIN <=2 SENSITIVE Sensitive     CEFEPIME <=0.12 SENSITIVE Sensitive     CEFTAZIDIME <=1 SENSITIVE Sensitive     CEFTRIAXONE <=0.25 SENSITIVE Sensitive     CIPROFLOXACIN <=0.25 SENSITIVE Sensitive     GENTAMICIN <=1 SENSITIVE Sensitive     IMIPENEM 4 SENSITIVE Sensitive     TRIMETH/SULFA <=20 SENSITIVE Sensitive     AMPICILLIN/SULBACTAM <=2 SENSITIVE Sensitive     PIP/TAZO <=4 SENSITIVE Sensitive     * PROTEUS MIRABILIS  Blood Culture ID Panel (Reflexed)     Status: Abnormal   Collection Time: 05/24/22  1:40 PM  Result Value Ref Range Status   Enterococcus faecalis NOT DETECTED NOT DETECTED Final   Enterococcus Faecium NOT DETECTED NOT DETECTED Final   Listeria monocytogenes NOT DETECTED NOT DETECTED Final   Staphylococcus species NOT DETECTED NOT DETECTED Final   Staphylococcus aureus (BCID) NOT DETECTED NOT DETECTED Final   Staphylococcus epidermidis NOT DETECTED NOT DETECTED Final   Staphylococcus lugdunensis NOT DETECTED NOT DETECTED Final   Streptococcus species NOT DETECTED NOT DETECTED Final    Streptococcus agalactiae NOT DETECTED NOT DETECTED Final   Streptococcus pneumoniae NOT DETECTED NOT DETECTED Final   Streptococcus pyogenes NOT DETECTED NOT DETECTED Final   A.calcoaceticus-baumannii NOT DETECTED NOT DETECTED Final   Bacteroides fragilis NOT DETECTED NOT DETECTED Final   Enterobacterales DETECTED (A) NOT DETECTED Final    Comment: Enterobacterales represent a large order of gram negative bacteria, not a single organism. CRITICAL RESULT CALLED TO, READ BACK BY AND VERIFIED WITH: PHARMD JESSICA M [phone] FCP    Enterobacter cloacae complex NOT DETECTED NOT DETECTED Final   Escherichia coli NOT DETECTED NOT DETECTED Final   Klebsiella aerogenes NOT DETECTED NOT DETECTED Final   Klebsiella oxytoca NOT DETECTED NOT DETECTED Final   Klebsiella pneumoniae NOT DETECTED NOT DETECTED Final   Proteus species DETECTED (A) NOT DETECTED Final    Comment: CRITICAL RESULT CALLED TO, READ BACK BY AND VERIFIED WITH: PHARMD JESSICA M [id number] FCP    Salmonella species NOT DETECTED NOT DETECTED Final   Serratia marcescens NOT DETECTED NOT DETECTED Final   Haemophilus influenzae NOT DETECTED NOT DETECTED Final   Neisseria meningitidis NOT DETECTED NOT DETECTED Final   Pseudomonas aeruginosa NOT DETECTED NOT DETECTED Final   Stenotrophomonas maltophilia NOT DETECTED NOT DETECTED Final   Candida albicans NOT DETECTED NOT DETECTED Final   Candida auris NOT DETECTED NOT DETECTED Final   Candida glabrata NOT DETECTED NOT DETECTED Final   Candida krusei NOT DETECTED NOT DETECTED Final   Candida parapsilosis NOT DETECTED NOT DETECTED Final   Candida tropicalis NOT DETECTED NOT DETECTED Final   Cryptococcus neoformans/gattii NOT DETECTED NOT DETECTED Final   CTX-M ESBL NOT DETECTED NOT DETECTED Final   Carbapenem resistance IMP NOT DETECTED NOT DETECTED Final   Carbapenem resistance KPC NOT DETECTED NOT DETECTED Final   Carbapenem resistance NDM NOT DETECTED NOT DETECTED Final    Carbapenem resist OXA 48 LIKE NOT DETECTED NOT DETECTED Final   Carbapenem resistance VIM NOT DETECTED NOT DETECTED Final    Comment: Performed at [facility],  [address] [place], [place] [address]  SARS Coronavirus 2 by RT PCR (hospital order, performed in [facility] hospital lab) *cepheid single result test*     Status: None   Collection Time: 05/24/22  1:46 PM   Specimen: Nasal Swab  Result Value Ref Range Status   SARS Coronavirus 2 by RT PCR NEGATIVE NEGATIVE Final    Comment: Performed at [facility], [address] [place], [place] [address]  Blood culture (routine x 2)     Status: None   Collection Time: 05/24/22  3:49 PM   Specimen: BLOOD  Result Value Ref Range Status   Specimen Description BLOOD BLOOD LEFT FOREARM  Final   Special Requests   Final    BOTTLES DRAWN AEROBIC AND ANAEROBIC Blood Culture results may not be optimal due to an inadequate volume of blood received in culture bottles   Culture   Final    NO GROWTH 5 DAYS Performed at [facility], [address] [place], [place] [address]    Report Status 05/29/2022 FINAL  Final  MRSA Next Gen by PCR, Nasal     Status: None   Collection Time: 05/25/22 12:01 PM   Specimen: Nasal Mucosa; Nasal Swab  Result Value Ref Range Status   MRSA by PCR Next Gen NOT DETECTED NOT DETECTED Final    Comment: (NOTE) The GeneXpert MRSA Assay (FDA approved for NASAL specimens only), is one component of a comprehensive MRSA colonization surveillance program. It is not intended to diagnose MRSA infection nor to guide or monitor treatment for MRSA infections. Test performance is not FDA approved in patients less than 59 years old. Performed at [facility], [address], [place], [place] [address]      Time coordinating discharge: Over 30 minutes  SIGNED:   Huey Bienenstock, MD  [facility] 05/31/2022, 10:17 AM Pager   If 7PM-7AM, please contact night-coverage [email or web address]

## 2022-06-07 NOTE — Progress Notes (Unsigned)
Subjective:    Patient ID: Suzanne Dickson, female   DOB: 09-03-1945     MRN: [id number]    Brief patient profile:  83  yobf quit smoking on admit 09/02/14  and proved to have GOLD III copd 11/2014    History of Present Illness  09/22/2015  f/u ov/Suzanne Dickson re: copd III/ symb 1602bid on 2lpm at rest/ 4lpm ex  Chief Complaint  Patient presents with   Follow-up    [place] had hemoptysis and increased SOB. She took round pred and augmentin and symptoms are some better. She has noticed minimal wheezing at night. She has used neb 3 x over the past 2 wks.   liked bevespi better but wasn't covered then and not doing as well on the symbicort though hfa not ideal  - see a/p rec Plan A = Automatic =stop symbicort and restart Bevespi Take 2 puffs first thing in am and then another 2 puffs about 12 hours later.  Work on inhaler technique:     Plan B = Backup - only use your albuterol nebulizer if you first try Plan B and it fails to help > ok to use the nebulizer up to every 4 hours but if start needing it regularly call for immediate appointment    PET 02/20/15 Malignant range FDG uptake is associated with the left upper lobe pulmonary nodule. 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  Dyspnea:  walking at home room to room rolling walker  Cough: none  Sleeping: 45 degrees  SABA use: albuterol q 2 days hfa/ very rarely neg  02: 3lpm sitting and up to 6lpm walking  Rec Reduce prednisone to 10 mg daily - if worse back to 20  mg and if stay better back it down to 10 mg one half daily  Tug  harder on the  Trelegy inhaler as I showed you today  Please schedule a follow up visit in 6 months but call sooner if needed    11/26/2021  f/u ov/Suzanne Dickson re: GOLD 2 copd/02 dep   maint on 10 mg /daily worse on 5mg   Chief Complaint  Patient presents with   Follow-up  Dyspnea:  across the room / sometimes sits outside garage Cough: none  Sleeping: 45 degrees wedge pillow  SABA use: uses hfa but neb one or two a day  02: 4 sleeping and up to  6lpm walking with sats mostly 90s  Rec Prednisone 10 mg alternating with 5 mg  Plan A = Automatic = Always=    Trelegy 100 each am  Plan B = Backup (to supplement plan A, not to replace it) Only use your albuterol inhaler as a rescue medication Plan C = Crisis (instead of Plan B but only if Plan B stops working) - only use your albuterol nebulizer if you first try Plan B  Admit date: 05/24/2022 Discharge date: 05/31/2022   Admitted From: (Home) Disposition:  (Home)   Recommendations for Outpatient Follow-up:  Follow up with PCP  in 1-2 weeks Please obtain BMP/CBC in one week       Discharge Condition: (Stable) CODE STATUS: ( DNR, SEEN  by palliative) Diet recommendation: Heart Healthy    Brief/Interim Summary:   Patient is a 77 y.o.  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  Patient was transferred to ICU on 4/30, secondary to increased work of breathing, she is transferred back to [facility] 06/24/2022 as her respiratory status has improved.   Significant events: 4/29>> admit to [facility]-sepsis-presumed pyelonephritis.  Post admission-developed SOB-placed on BiPAP. 4/30>> blood cultures positive for Proteus.  Failed liberation of BiPAP.  Transferred to ICU 5/30>>transferred out of ICU    Significant studies: 4/29>> CT head: No acute intracranial abnormality 4/29>> CT abdomen/pelvis: Motion artifact-left mid kidney with stranding/enhancement. 4/30>> CXR: Increased interstitial opacities-pulm edema versus atypical infection.   Significant microbiology data: 4/29>> COVID PCR: Negative 4/29>> blood culture: Proteus mirabilis, pansensitive   Severe sepsis secondary to left pyelonephritis and Proteus bacteremia -Severe sepsis present on admission -Workup significant for Proteus bacteremia, treated with IV Rocephin during hospital stay, she will be discharged on Keflex for total of 10 days treatment.   Acute on chronic hypoxic respiratory failure (on 6-8 L of oxygen at baseline) -Requiring BiPAP support, she was transferred to ICU, weaned off BiPAP.  At baseline 6 to 8 L nasal cannula - She was encouraged to use incentive spirometry and flutter valve   COPD exacerbation -With some wheezing, treated with IV steroids, she will be discharged on increased dose prednisone for next 3 days, then back on home dose prednisone 10 mg oral daily.     Acute metabolic encephalopathy Seems to have improved-likely secondary to sepsis physiology CT head negative Mentation back to baseline   Macrocytic anemia Hemoglobin at baseline   Thrombocytopenia Due to sepsis physiology Mild   COPD-chronic hypoxia-6-8 L of oxygen at baseline On daily prednisone   HTN Continue with amlodipine   CKD stage IIIb Close to baseline-mildly elevated creatinine   OSA per spouse-noncompliant with CPAP at home   Stage Ia non-small cell cancer of the lung  Follows with Dr. Arbutus Ped, suspicion for recurrence, following with Dr. Arbutus Ped with plan for PET scan as an outpatient, which has been done 4/25, unfortunately it does show suspicion for recurrence of malignancy. Marland Kitchen   BMI: Estimated body mass index is 29.84 kg/m as calculated from the following:   Height as of this encounter: 5\' 2"  (1.575 m).    Weight as of this encounter: 74 kg.    Goals of care -Palliative  medicine consulted, they have tried to arrange meeting with the husband, patient is clear that she wishes for DNR, this has been confirmed during hospital stay, patient is currently DNR.       06/09/2022 83m  f/u ov/Treysean Petruzzi re: GOLD 2/ 02 dep    maint on Trelegy   Chief Complaint  Patient presents with   Follow-up    Breathing is good.  Low energy.  Weak from kidney infection.  Hospitalized  05/24/22 to 05/31/2022 Sepsis.   Dyspnea:  walks with walker thru house on 6lpm  Cough: none  Sleeping: 45 degrees wedge pillow - has old cpap machine not using  SABA use: occ hfa/ rarely neb  02: 4lpm sleeping  6 lpm with activity     No obvious day to day or daytime variability or assoc excess/ purulent sputum or mucus plugs or hemoptysis or cp or chest tightness, subjective wheeze or overt sinus or hb symptoms.   Sleeping  without nocturnal  or early am exacerbation  of respiratory  c/o's or need for noct saba. Also denies any obvious fluctuation of symptoms with weather or environmental changes or other aggravating or alleviating factors except as outlined above   No unusual exposure hx or h/o childhood pna/ asthma or knowledge of premature birth.  Current Allergies, Complete Past Medical History, Past Surgical History, Family History, and Social History were reviewed in [employer] record.  ROS  The following are not active complaints unless bolded Hoarseness, sore throat, dysphagia, dental problems, itching, sneezing,  nasal congestion or discharge of excess mucus or purulent secretions, ear ache,   fever, chills, sweats, unintended wt loss or wt gain, classically pleuritic or exertional cp,  orthopnea pnd or arm/hand swelling  or leg swelling, presyncope, palpitations, abdominal pain, anorexia, nausea, vomiting, diarrhea  or change in bowel habits or change in bladder habits, change in stools or change in urine,  dysuria, hematuria,  rash, arthralgias, visual complaints, headache, numbness, weakness or ataxia or problems with walking or coordination,  change in mood or  memory.        Current Meds  Medication Sig   acetaminophen (TYLENOL) 500 MG tablet Take 1 tablet (500 mg total) by mouth every 6 (six) hours as needed.   albuterol (PROVENTIL) (2.5 MG/3ML) 0.083% nebulizer solution Use every 4 hours and as needed for shortness of breath.   albuterol (VENTOLIN HFA) 108 (90 Base) MCG/ACT inhaler INHALE 2 PUFFS BY MOUTH EVERY 4 HOURS AS NEEDED FOR WHEEZING FOR SHORTNESS OF BREATH   amLODipine (NORVASC) 5 MG tablet Take 5 mg by mouth daily.   aspirin EC 81 MG tablet Take 81 mg by mouth daily. Swallow whole.   atorvastatin (LIPITOR) 20 MG tablet Take 20 mg by mouth at bedtime.   Azelastine-Fluticasone 137-50 MCG/ACT SUSP 2 PUFFS INTO EACH NOSTRIL AT BEDTIME   busPIRone (BUSPAR) 7.5 MG tablet Take 7.5 mg by mouth 3 (three) times daily.   cetirizine (ZYRTEC) 10 MG tablet Take 10 mg by mouth at bedtime.   cholecalciferol (VITAMIN D3) 25 MCG (1000 UNIT) tablet Take 1,000 Units by mouth daily.   clopidogrel (PLAVIX) 75 MG tablet Take 75 mg by mouth every morning.    Cyanocobalamin (B-12 PO) Take 1 tablet by mouth daily.   empagliflozin (JARDIANCE) 10 MG TABS tablet Take 10 mg by mouth daily.   famotidine (PEPCID) 20 MG tablet One after supper   ferrous sulfate 325 (65 FE) MG EC tablet Take 325 mg by mouth 3 (three) times daily with meals.    Fluticasone-Umeclidin-Vilant (TRELEGY ELLIPTA) 100-62.5-25 MCG/ACT AEPB INHALE 1 PUFF ONCE DAILY   losartan (COZAAR) 50 MG tablet Take 50 mg by mouth every morning.   methocarbamol (ROBAXIN) 500 MG tablet Take by mouth.   metoprolol succinate (TOPROL-XL) 25 MG 24 hr tablet Take 25 mg by mouth daily.   nitroGLYCERIN (NITRODUR - DOSED IN MG/24 HR) 0.4 mg/hr patch Place 0.4 mg onto the skin every morning.   nitroGLYCERIN (NITROSTAT) 0.4 MG SL tablet Place 0.4 mg under  the tongue every 5 (five) minutes as needed for chest pain.   OVER THE COUNTER MEDICATION Take 1 tablet by mouth at bedtime. OTC sleep aid - unknown ingredients  OVER THE COUNTER MEDICATION Take 3,000 mcg by mouth daily. Vitamin B12 tablet daily   OXYGEN Inhale 4-6 L into the lungs continuous.   pantoprazole (PROTONIX) 40 MG tablet Take 30-60 min before first meal of the day (Patient taking differently: Take 40 mg by mouth daily before breakfast.)   Polyvinyl Alcohol-Povidone (REFRESH OP) Place 1 drop into both eyes daily as needed (dry eyes).   predniSONE (DELTASONE) 10 MG tablet Start on 5/10   sertraline (ZOLOFT) 100 MG tablet Take 100 mg by mouth daily.   tiZANidine (ZANAFLEX) 4 MG tablet Take 1 tablet (4 mg total) by mouth every 8 (eight) hours as needed for muscle spasms.                                Objective:  Physical Exam  Wts  06/09/2022   160  05/25/2021     156 02/16/2021   158 06/02/2020     170  12/03/2019   162 03/29/2019     164  08/08/2018   154 10/28/2014        160 > 12/09/2014 161 > 03/14/2015 158 >    04/25/2015  161 >  06/20/2015  158 > 09/22/2015   159 > 11/19/2015 159 > 12/23/2015   159  > 02/03/2016    163  > 05/18/2016   161 > 11/17/2016   154 > 01/07/2017  157 > 04/18/2017   148 > 07/19/2017  145 > 10/19/2017    150  > 11/16/2017   150 >  01/11/2018   150> 02/17/2018  150     10/10/14 160 lb (72.576 kg)  09/16/14 156 lb (70.761 kg)  09/04/14 160 lb 13.6 oz (72.96 kg)     Vital signs reviewed  06/09/2022  - Note at rest 02 sats  96% on 4lpm POC   General appearance:    wc bound elderly obese bf nad   HEENT : Oropharynx  clear    NECK :  without  apparent JVD/ palpable Nodes/TM    LUNGS: no acc muscle use,  Mild barrel  contour chest wall with bilateral  Distant bs s audible wheeze and  without cough on insp or exp maneuvers  and mild  Hyperresonant  to  percussion bilaterally     CV:  RRR  no s3 or murmur or increase in P2, and no edema   ABD:   soft and non-tender   MS:  Nl gait/ ext warm without deformities Or obvious joint restrictions  calf tenderness, cyanosis or clubbing     SKIN: warm and dry without lesions    NEURO:  alert, approp, nl sensorium with  no motor or cerebellar deficits apparent.      CXR PA and Lateral:   06/09/2022 :    I personally reviewed images and impression is as follows:     Improved aeration vs last portable view as inpt/ no as dz or worrisome findings       Assessment:

## 2022-06-09 ENCOUNTER — Ambulatory Visit (INDEPENDENT_AMBULATORY_CARE_PROVIDER_SITE_OTHER): Payer: Medicare PPO

## 2022-06-09 ENCOUNTER — Ambulatory Visit (INDEPENDENT_AMBULATORY_CARE_PROVIDER_SITE_OTHER): Payer: Medicare PPO | Admitting: Internal Medicine

## 2022-06-09 ENCOUNTER — Encounter: Payer: Self-pay | Admitting: Internal Medicine

## 2022-06-09 ENCOUNTER — Telehealth: Payer: Self-pay | Admitting: Physician Assistant

## 2022-06-09 VITALS — BP 136/78 | HR 78 | Temp 98.3°F | Ht 62.0 in | Wt 160.0 lb

## 2022-06-09 DIAGNOSIS — J9611 Chronic respiratory failure with hypoxia: Secondary | ICD-10-CM | POA: Diagnosis not present

## 2022-06-09 DIAGNOSIS — R918 Other nonspecific abnormal finding of lung field: Secondary | ICD-10-CM | POA: Diagnosis not present

## 2022-06-09 DIAGNOSIS — J449 Chronic obstructive pulmonary disease, unspecified: Secondary | ICD-10-CM | POA: Diagnosis not present

## 2022-06-09 NOTE — Patient Instructions (Addendum)
Taper your prednisone down to 10 mg x one-half daily (5 mg)  as before if you can  Please see Dr Maple Hudson for re-evaluation of your sleep apnea   Please remember to go to the  x-ray department  for your tests - we will call you with the results when they are available     Please schedule a follow up visit in 3 months but call sooner if needed

## 2022-06-09 NOTE — Assessment & Plan Note (Addendum)
Quit smoking 08/2014 - PFT's 12/09/14   FEV1 0.75 (44 % ) ratio 62  p no % improvement from saba p ? prior to study with DLCO  24 % corrects to 48 % for alv volume    - 12/09/2014 referred to rehab > started 12/2014  - 04/25/2015   try BEVESPI  - 06/20/2015  Changed by insurance> back on symb 160  - 09/22/2015 not happy with symbicort > changed back to besvespi 2bid > not happy with bevespi > changed back to symb 160 2bid and consider adding spiriva next vs trelegy trial   - 02/17/2018  After extensive coaching inhaler device,  effectiveness =    50% with hfa and 90% with elipta so rec changed to trelegy   - 05/08/2018 added pred x 6 days as "plan D"  - 01/15/2019 changed pred to 20 mg until better then 10 mg daily  - 03/29/2019  After extensive coaching inhaler device,  effectiveness =    50% with hfa  - 12/03/2019 changed to ceiling of 20 mg pred/ floor of 5 mg per day - 02/16/2021 chagned pred 20 mg ceiling and floor of 10 mg as flared on 5 consistently  -  11/26/21 rechallenged with ceiling of 20 mg and floor of 5 mg daily/ retrained on Elipta    Group D (now reclassified as E) in terms of symptom/risk and laba/lama/ICS  therefore appropriate rx at this point >>>  trelegy plus approp saba

## 2022-06-09 NOTE — Assessment & Plan Note (Signed)
Started on 02 2lpm at d/c 09/04/14  - 10/28/2014   Walked RA x one lap @ 185 stopped due to  desat to 82%  at nl pace so rec 02 2lpm with walking and sleeping  - 12/09/2014 referred for POC > did not qualify - 08/17/2016 Patient Saturations on Room Air at Rest = 85%---increased to 93% on 2lpm o2 - 04/18/2017  Walking across exam room sats dropped into 80's on 2lpm  - 11/16/2017   Walked RA x one lap @ 185 stopped due to 87% and even on 5lpm could not maintain sats at nl pace  -  01/11/2018 humidfy 02  Added > not using as of 05/08/2018 > rec she do so > improved on humidity - 03/29/2019  Required up to 6lpm with activity to complete about 200 ft walking   Advised: Make sure you check your oxygen saturation  AT  your highest level of activity (not after you stop)   to be sure it stays over 90% and adjust  02 flow upward to maintain this level if needed but remember to turn it back to previous settings when you stop (to conserve your supply).          Each maintenance medication was reviewed in detail including emphasizing most importantly the difference between maintenance and prns and under what circumstances the prns are to be triggered using an action plan format where appropriate.  Total time for H and P, chart review, counseling, reviewing hfa/dpi/02/ neb  device(s) and generating customized AVS unique to this office visit / same day charting = 25 min

## 2022-06-14 DIAGNOSIS — N390 Urinary tract infection, site not specified: Secondary | ICD-10-CM | POA: Diagnosis not present

## 2022-06-14 DIAGNOSIS — A419 Sepsis, unspecified organism: Secondary | ICD-10-CM | POA: Diagnosis not present

## 2022-06-14 DIAGNOSIS — E119 Type 2 diabetes mellitus without complications: Secondary | ICD-10-CM | POA: Diagnosis not present

## 2022-06-14 DIAGNOSIS — D72829 Elevated white blood cell count, unspecified: Secondary | ICD-10-CM | POA: Diagnosis not present

## 2022-06-14 DIAGNOSIS — I10 Essential (primary) hypertension: Secondary | ICD-10-CM | POA: Diagnosis not present

## 2022-06-14 DIAGNOSIS — E876 Hypokalemia: Secondary | ICD-10-CM | POA: Diagnosis not present

## 2022-06-22 DIAGNOSIS — E119 Type 2 diabetes mellitus without complications: Secondary | ICD-10-CM | POA: Diagnosis not present

## 2022-06-22 DIAGNOSIS — I2510 Atherosclerotic heart disease of native coronary artery without angina pectoris: Secondary | ICD-10-CM | POA: Diagnosis not present

## 2022-06-22 DIAGNOSIS — I10 Essential (primary) hypertension: Secondary | ICD-10-CM | POA: Diagnosis not present

## 2022-06-22 DIAGNOSIS — E782 Mixed hyperlipidemia: Secondary | ICD-10-CM | POA: Diagnosis not present

## 2022-06-22 NOTE — Progress Notes (Signed)
[facility] OFFICE PROGRESS NOTE  Dot Been, FNP [address] [place] [address]  DIAGNOSIS: Stage IA (T1a, N0, M0) non-small cell lung cancer, adenocarcinoma presented with left upper lobe lung nodule diagnosed in March 2018.   PRIOR THERAPY: SBRT to the left lung nodule under the care of Dr. Roselind Messier completed on 05/04/2016.   CURRENT THERAPY: Re-Referral to radiation oncology   INTERVAL HISTORY: Suzanne Dickson 77 y.o. female returns to the clinic today for a follow-up visit accompanied by her husband. She was last seen in the clinic by Dr. Arbutus Ped on 04/29/22. She is followed for her history of stage I NSCLC. When she was last seen, she had a restaging CT scan at that time which showed new soft tissue nodule developing in the left lung at the area of the posttreatment changes and concerning for disease recurrence. Therefore, Dr. Arbutus Ped recommended a PET scan to further evaluate this.   In the interval since last being seen, she was hospitalized for encephalopathy-upon further evaluation she was found to have severe sepsis due to presumed left-sided pyelonephritis with gram-negative bacteremia. She is feeling better at this time.   She is tearful today due to her scan results. She denies any fever, chills, night sweats, or unexplained weight loss.  She continues to have left-sided chest discomfort which Dr. Arbutus Ped previously felt may be secondary to a compressions fracture on T6. She states the pain "comes and goes". She takes tylenol and muscle relaxer which is effective. She has baseline dyspnea on exertion for which she is on supplemental oxygen with 4-7 L. She states the heat and pollen makes her breathing worse.  Denies any major cough.  Denies any nausea, vomiting, or constipation. She had a mild episode of diarrhea a few days ago which resolved with pepto bismol.  Denies any headache or visual changes.  She is here today for evaluation and to review her PET scan  and for a more detailed discussion about her current condition and recommended treatment options.   MEDICAL HISTORY: Past Medical History:  Diagnosis Date   Adenocarcinoma of left lung, 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   "I'm in remission" (04/05/2017)   Adenocarcinoma, lung (HCC)    Anxiety    Arthritis    "legs, elbows" (04/05/2017)   Bronchitis, chronic (HCC)    "haven't had it in awhile" (04/05/2017)   CHF (congestive heart failure) (HCC)    COPD (chronic obstructive pulmonary disease) (HCC)    Coronary artery disease    Daily headache    "recently" (04/05/2017)   Depression    Dyspnea    with exertion   GERD (gastroesophageal reflux disease)    History of blood transfusion 1950s   History of radiation therapy 05/04/16 - 05/12/16   Left lung treated to 54 Gy with 3 fx of 18 Gy   Hypertension    On home oxygen therapy    "2L; 24/7" (04/05/2017)   OSA (obstructive sleep apnea)    OSA on CPAP    Pneumonia 10/2015   "at least 3 times" (04/05/2017)   Pulmonary hypertension (HCC)     ALLERGIES:  has No Known Allergies.  MEDICATIONS:  Current Outpatient Medications  Medication Sig Dispense Refill   acetaminophen (TYLENOL) 500 MG tablet Take 1 tablet (500 mg total) by mouth every 6 (six) hours as needed. 30 tablet 0   albuterol (PROVENTIL) (2.5 MG/3ML) 0.083% nebulizer solution Use every 4 hours and as needed for shortness of breath.  180 mL 11   albuterol (VENTOLIN HFA) 108 (90 Base) MCG/ACT inhaler INHALE 2 PUFFS BY MOUTH EVERY 4 HOURS AS NEEDED FOR WHEEZING FOR SHORTNESS OF BREATH 18 g 2   amLODipine (NORVASC) 5 MG tablet Take 5 mg by mouth daily.     aspirin EC 81 MG tablet Take 81 mg by mouth daily. Swallow whole.     atorvastatin (LIPITOR) 20 MG tablet Take 20 mg by mouth at bedtime.     Azelastine-Fluticasone 137-50 MCG/ACT SUSP 2 PUFFS INTO EACH NOSTRIL AT BEDTIME 23 g 3   busPIRone (BUSPAR) 7.5 MG tablet Take 7.5 mg by mouth 3 (three) times daily.     cetirizine (ZYRTEC) 10 MG  tablet Take 10 mg by mouth at bedtime.     cholecalciferol (VITAMIN D3) 25 MCG (1000 UNIT) tablet Take 1,000 Units by mouth daily.     clopidogrel (PLAVIX) 75 MG tablet Take 75 mg by mouth every morning.      Cyanocobalamin (B-12 PO) Take 1 tablet by mouth daily.     empagliflozin (JARDIANCE) 10 MG TABS tablet Take 10 mg by mouth daily.     ferrous sulfate 325 (65 FE) MG EC tablet Take 325 mg by mouth 3 (three) times daily with meals.      Fluticasone-Umeclidin-Vilant (TRELEGY ELLIPTA) 100-62.5-25 MCG/ACT AEPB INHALE 1 PUFF ONCE DAILY 60 each 3   losartan (COZAAR) 50 MG tablet Take 50 mg by mouth every morning.     methocarbamol (ROBAXIN) 500 MG tablet Take by mouth.     metoprolol succinate (TOPROL-XL) 25 MG 24 hr tablet Take 25 mg by mouth daily.     nitroGLYCERIN (NITRODUR - DOSED IN MG/24 HR) 0.4 mg/hr patch Place 0.4 mg onto the skin every morning.  3   nitroGLYCERIN (NITROSTAT) 0.4 MG SL tablet Place 0.4 mg under the tongue every 5 (five) minutes as needed for chest pain.     OVER THE COUNTER MEDICATION Take 1 tablet by mouth at bedtime. OTC sleep aid - unknown ingredients     OVER THE COUNTER MEDICATION Take 3,000 mcg by mouth daily. Vitamin B12 tablet daily     OXYGEN Inhale 4-6 L into the lungs continuous.     pantoprazole (PROTONIX) 40 MG tablet Take 30-60 min before first meal of the day (Patient taking differently: Take 40 mg by mouth daily before breakfast.) 90 tablet 3   Polyvinyl Alcohol-Povidone (REFRESH OP) Place 1 drop into both eyes daily as needed (dry eyes).     predniSONE (DELTASONE) 10 MG tablet Start on 5/10 100 tablet 0   No current facility-administered medications for this visit.    SURGICAL HISTORY:  Past Surgical History:  Procedure Laterality Date   COLONOSCOPY     CORONARY ANGIOPLASTY WITH STENT PLACEMENT  04/05/2017   CORONARY STENT INTERVENTION N/A 04/05/2017   Procedure: CORONARY STENT INTERVENTION;  Surgeon: Rinaldo Cloud, MD;  Location: [facility]  INVASIVE CV LAB;  Service: Cardiovascular;  Laterality: N/A;   DILATION AND CURETTAGE OF UTERUS     FUDUCIAL PLACEMENT Left 03/31/2016   Procedure: PLACEMENT OF FUDUCIAL LEFT UPPER LOBE;  Surgeon: Leslye Peer, MD;  Location: [facility] OR;  Service: Thoracic;  Laterality: Left;   LAPAROSCOPIC CHOLECYSTECTOMY     LEFT HEART CATH AND CORONARY ANGIOGRAPHY N/A 02/17/2017   Procedure: LEFT HEART CATH AND CORONARY ANGIOGRAPHY;  Surgeon: Rinaldo Cloud, MD;  Location: [facility] INVASIVE CV LAB;  Service: Cardiovascular;  Laterality: N/A;   SINUSOTOMY     TONSILLECTOMY  TUBAL LIGATION     VIDEO BRONCHOSCOPY WITH ENDOBRONCHIAL NAVIGATION N/A 03/31/2016   Procedure: VIDEO BRONCHOSCOPY WITH ENDOBRONCHIAL NAVIGATION;  Surgeon: Leslye Peer, MD;  Location: [facility] OR;  Service: Thoracic;  Laterality: N/A;    REVIEW OF SYSTEMS:   Review of Systems  Constitutional: Negative for appetite change, chills, fatigue, fever and unexpected weight change.  HENT: Negative for mouth sores, nosebleeds, sore throat and trouble swallowing.   Eyes: Negative for eye problems and icterus.  Respiratory: Positive for baseline dyspnea on exertion for which she is currently on supplemental oxygen 4 to 7 L.  Negative for cough, hemoptysis,  and wheezing.   Cardiovascular: Positive for intermittent left-sided rib pain.  Negative for leg swelling.  Gastrointestinal: Negative for abdominal pain, constipation, diarrhea (resolved), nausea and vomiting.  Genitourinary: Negative for bladder incontinence, difficulty urinating, dysuria, frequency and hematuria.   Musculoskeletal: Negative for back pain, gait problem, neck pain and neck stiffness.  Skin: Negative for itching and rash.  Neurological: Negative for dizziness, extremity weakness, gait problem, headaches, light-headedness and seizures.  Hematological: Negative for adenopathy. Does not bruise/bleed easily.  Psychiatric/Behavioral: Negative for confusion, depression and sleep disturbance. The  patient is not nervous/anxious.     PHYSICAL EXAMINATION:  Blood pressure 135/72, pulse 88, temperature 98.2 F (36.8 C), temperature source Oral, SpO2 100 %.  ECOG PERFORMANCE STATUS: 2  Physical Exam  Constitutional: Oriented to person, place, and time and chronically ill-appearing female and in no distress.  HENT:  Head: Normocephalic and atraumatic.  Mouth/Throat: Oropharynx is clear and moist. No oropharyngeal exudate.  Eyes: Conjunctivae are normal. Right eye exhibits no discharge. Left eye exhibits no discharge. No scleral icterus.  Neck: Normal range of motion. Neck supple.  Cardiovascular: Normal rate, regular rhythm, normal heart sounds and intact distal pulses.   Pulmonary/Chest: Effort normal and breath sounds normal. No respiratory distress. No wheezes. No rales. On 4 L of supplemental oxygen Abdominal: Soft. Bowel sounds are normal. Exhibits no distension and no mass. There is no tenderness.  Musculoskeletal: Normal range of motion. Exhibits no edema.  Lymphadenopathy:    No cervical adenopathy.  Neurological: Alert and oriented to person, place, and time. Exhibits muscle wasting. Examined in the wheelchair.  Skin: Skin is warm and dry. No rash noted. Not diaphoretic. No erythema. No pallor.  Psychiatric: Mood, memory and judgment normal.  Vitals reviewed.  LABORATORY DATA: Lab Results  Component Value Date   WBC 7.3 05/28/2022   HGB 9.3 (L) 05/28/2022   HCT 31.2 (L) 05/28/2022   MCV 102.6 (H) 05/28/2022   PLT 175 05/28/2022      Chemistry      Component Value Date/Time   NA 139 05/28/2022 0554   NA 141 08/13/2016 1120   K 3.7 05/28/2022 0554   K 3.4 (L) 08/13/2016 1120   CL 102 05/28/2022 0554   CO2 28 05/28/2022 0554   CO2 25 08/13/2016 1120   BUN 18 05/28/2022 0554   BUN 15.8 08/13/2016 1120   CREATININE 1.14 (H) 05/31/2022 0544   CREATININE 1.40 (H) 04/20/2022 1102   CREATININE 1.1 08/13/2016 1120      Component Value Date/Time   CALCIUM 8.9  05/28/2022 0554   CALCIUM 9.1 08/13/2016 1120   ALKPHOS 45 05/28/2022 0554   ALKPHOS 68 08/13/2016 1120   AST 21 05/28/2022 0554   AST 16 04/20/2022 1102   AST 20 08/13/2016 1120   ALT 22 05/28/2022 0554   ALT 20 04/20/2022 1102   ALT 14  08/13/2016 1120   BILITOT 0.6 05/28/2022 0554   BILITOT 0.4 04/20/2022 1102   BILITOT 0.47 08/13/2016 1120       RADIOGRAPHIC STUDIES:  DG Chest 2 View  Result Date: 06/13/2022 CLINICAL DATA:  Pulmonary infiltrates. EXAM: CHEST - 2 VIEW COMPARISON:  May 25, 2022.  April 20, 2022. FINDINGS: Normal cardiac size. Prominent left pulmonary artery is noted. Stable scarring is seen in right midlung. Stable scarring or post treatment changes seen in left hilar region. Bibasilar interstitial densities noted on prior exam appear to be significantly improved currently. Probable old midthoracic compression fracture is noted. IMPRESSION: Stable right midlung scarring. Stable scarring or post treatment change seen in left hilar region. Aortic Atherosclerosis (ICD10-I70.0). Electronically Signed   By: Lupita Raider M.D.   On: 06/13/2022 11:50   ECHOCARDIOGRAM COMPLETE  Result Date: 05/26/2022    ECHOCARDIOGRAM REPORT   Patient Name:   Suzanne Dickson Date of Exam: 05/26/2022 Medical Rec #:  [id number]       Height:       62.0 in Accession #:    [id number]      Weight:       163.1 lb Date of Birth:  28-Feb-1945      BSA:          1.753 m Patient Age:    76 years        BP:           163/93 mmHg Patient Gender: F               HR:           124 bpm. Exam Location:  Inpatient Procedure: 2D Echo, Color Doppler and Cardiac Doppler Indications:    I50.31 Acute diastolic (congestive) heart failure  History:        Patient has prior history of Echocardiogram examinations. CHF,                 CAD; Pulmonary HTN and COPD.  Sonographer:    Mike Gip Referring Phys: Cheri Fowler IMPRESSIONS  1. Left ventricular ejection fraction, by estimation, is 65 to 70%. The left ventricle  has normal function. The left ventricle has no regional wall motion abnormalities. There is mild concentric left ventricular hypertrophy. Left ventricular diastolic parameters were normal.  2. Right ventricular systolic function is hyperdynamic. The right ventricular size is normal. There is moderately elevated pulmonary artery systolic pressure. The estimated right ventricular systolic pressure is 56.0 mmHg.  3. The mitral valve is grossly normal. Mild mitral valve regurgitation.  4. Tricuspid valve regurgitation is mild to moderate.  5. 2D Vena Contract 0.47 cm. The aortic valve is tricuspid. Aortic valve regurgitation is moderate.  6. The inferior vena cava is normal in size with greater than 50% respiratory variability, suggesting right atrial pressure of 3 mmHg. Comparison(s): LV is more vigorous from prior reporting. Unable to view prior study. FINDINGS  Left Ventricle: Left ventricular ejection fraction, by estimation, is 65 to 70%. The left ventricle has normal function. The left ventricle has no regional wall motion abnormalities. The left ventricular internal cavity size was small. There is mild concentric left ventricular hypertrophy. Left ventricular diastolic parameters were normal. Right Ventricle: The right ventricular size is normal. No increase in right ventricular wall thickness. Right ventricular systolic function is hyperdynamic. There is moderately elevated pulmonary artery systolic pressure. The tricuspid regurgitant velocity is 3.64 m/s, and with an assumed right atrial pressure of 3 mmHg, the estimated right  ventricular systolic pressure is 56.0 mmHg. Left Atrium: Left atrial size was normal in size. Right Atrium: Right atrial size was normal in size. Pericardium: There is no evidence of pericardial effusion. Mitral Valve: The mitral valve is grossly normal. Mild mitral valve regurgitation. Tricuspid Valve: The tricuspid valve is normal in structure. Tricuspid valve regurgitation is mild to  moderate. No evidence of tricuspid stenosis. Aortic Valve: 2D Vena Contract 0.47 cm. The aortic valve is tricuspid. Aortic valve regurgitation is moderate. Aortic regurgitation PHT measures 220 msec. Pulmonic Valve: The pulmonic valve was normal in structure. Pulmonic valve regurgitation is not visualized. No evidence of pulmonic stenosis. Aorta: The aortic root and ascending aorta are structurally normal, with no evidence of dilitation. Venous: The inferior vena cava is normal in size with greater than 50% respiratory variability, suggesting right atrial pressure of 3 mmHg. IAS/Shunts: No atrial level shunt detected by color flow Doppler.  LEFT VENTRICLE PLAX 2D LVIDd:         3.70 cm     Diastology LVIDs:         2.30 cm     LV e' medial:    14.00 cm/s LV PW:         1.10 cm     LV E/e' medial:  9.4 LV IVS:        1.10 cm     LV e' lateral:   18.50 cm/s LVOT diam:     1.90 cm     LV E/e' lateral: 7.1 LV SV:         54 LV SV Index:   31 LVOT Area:     2.84 cm  LV Volumes (MOD) LV vol d, MOD A2C: 67.6 ml LV vol d, MOD A4C: 75.2 ml LV vol s, MOD A2C: 23.8 ml LV vol s, MOD A4C: 28.5 ml LV SV MOD A2C:     43.8 ml LV SV MOD A4C:     75.2 ml LV SV MOD BP:      46.7 ml RIGHT VENTRICLE             IVC RV Basal diam:  3.70 cm     IVC diam: 1.60 cm RV S prime:     17.30 cm/s TAPSE (M-mode): 1.8 cm LEFT ATRIUM           Index        RIGHT ATRIUM           Index LA diam:      3.30 cm 1.88 cm/m   RA Area:     14.00 cm LA Vol (A2C): 34.5 ml 19.68 ml/m  RA Volume:   35.30 ml  20.14 ml/m LA Vol (A4C): 37.7 ml 21.50 ml/m  AORTIC VALVE LVOT Vmax:   118.00 cm/s LVOT Vmean:  67.100 cm/s LVOT VTI:    0.190 m AI PHT:      220 msec  AORTA Ao Root diam: 3.00 cm Ao Asc diam:  2.90 cm MITRAL VALVE                TRICUSPID VALVE MV Area (PHT): 6.65 cm     TR Peak grad:   53.0 mmHg MV Decel Time: 114 msec     TR Vmax:        364.00 cm/s MV E velocity: 132.00 cm/s                             SHUNTS  Systemic  VTI:  0.19 m                             Systemic Diam: 1.90 cm Riley Lam MD Electronically signed by Riley Lam MD Signature Date/Time: 05/26/2022/8:52:14 AM    Final      ASSESSMENT/PLAN:  This is a very pleasant 77 year old African-American female with history of stage Ia non-small cell lung cancer, adenocarcinoma.  She presented with left upper lobe lung nodule.  She is status post stereotactic radiotherapy to the left upper lobe nodule.  The patient was seen on 04/29/2022 at that time, her scan showed new soft tissue nodule developing in the left lung in the area and the posttreatment changes concerning for disease recurrence.  Therefore, Dr. Arbutus Ped recommended a PET scan to further evaluate this.  The patient was seen with Dr. Arbutus Ped today.  Dr. Arbutus Ped personally independently reviewed the scan discussed results with the patient today.  The scan showed radiation changes in the left hemithorax with an underlying 13 mm lingular nodule suspicious for recurrence.  Dr. Arbutus Ped recommends we referral to radiation oncology to see if she is a candidate for additional radiation to this area.  If so, then Dr. Arbutus Ped recommends that we see her back for follow-up visit in 4 months with a repeat CT scan at that time.  The patient was advised to call immediately if she has any concerning symptoms in the interval. The patient voices understanding of current disease status and treatment options and is in agreement with the current care plan. All questions were answered. The patient knows to call the clinic with any problems, questions or concerns. We can certainly see the patient much sooner if necessary     Radiation changes in the left hemithorax. Underlying 13 mm lingular nodule, suspicious for recurrence.   Additional ancillary findings as above.   Orders Placed This Encounter  Procedures   CT Chest W Contrast    Standing Status:   Future    Standing Expiration Date:    06/24/2023    Order Specific Question:   If indicated for the ordered procedure, I authorize the administration of contrast media per Radiology protocol    Answer:   Yes    Order Specific Question:   Does the patient have a contrast media/X-ray dye allergy?    Answer:   No    Order Specific Question:   Preferred imaging location?    Answer:   [facility]   CBC with Differential (Cancer Center Only)    Standing Status:   Future    Standing Expiration Date:   06/24/2023   CMP (Cancer Center only)    Standing Status:   Future    Standing Expiration Date:   06/24/2023   Ambulatory referral to Radiation Oncology    Referral Priority:   Routine    Referral Type:   Consultation    Referral Reason:   Specialty Services Required    Requested Specialty:   Radiation Oncology    Number of Visits Requested:   1      Phinehas Grounds L Zaidyn Claire, PA-C 06/24/22  ADDENDUM: Hematology/Oncology Attending: I had a face-to-face encounter with the patient today.  I reviewed her record, lab, scan and recommended her care plan.  This is a very pleasant 77 years old African-American female with history of stage Ia non-small cell lung cancer, adenocarcinoma diagnosed in March 2018 status post SBRT under the care  of Dr. Roselind Messier and she had been on observation since that time.  The patient was found on recent CT scan of the chest to have questionable disease recurrence in the left lung.  I did order a PET scan which was performed recently and the patient is here for evaluation and discussion of her treatment options. I personally and independently reviewed the PET scan and discussed the result with the patient and her husband. Her scan showed no other concerning finding for disease metastasis except for the local recurrence. I recommended for the patient to see Dr. Roselind Messier for consideration of curative radiotherapy to the progressive disease in her left lung. We will see her back for follow-up visit in 4  months for evaluation and repeat CT scan of the chest for restaging of her disease after the radiotherapy. The patient was advised to call immediately if she has any other concerning symptoms in the interval. The total time spent in the appointment was 30 minutes. Disclaimer: This note was dictated with voice recognition software. Similar sounding words can inadvertently be transcribed and may be missed upon review. Lajuana Matte, MD

## 2022-06-24 ENCOUNTER — Telehealth: Payer: Self-pay | Admitting: Radiation Oncology

## 2022-06-24 ENCOUNTER — Inpatient Hospital Stay: Payer: Medicare PPO | Attending: Internal Medicine | Admitting: Physician Assistant

## 2022-06-24 VITALS — BP 135/72 | HR 88 | Temp 98.2°F

## 2022-06-24 DIAGNOSIS — I272 Pulmonary hypertension, unspecified: Secondary | ICD-10-CM | POA: Diagnosis not present

## 2022-06-24 DIAGNOSIS — I2510 Atherosclerotic heart disease of native coronary artery without angina pectoris: Secondary | ICD-10-CM | POA: Insufficient documentation

## 2022-06-24 DIAGNOSIS — Z923 Personal history of irradiation: Secondary | ICD-10-CM | POA: Insufficient documentation

## 2022-06-24 DIAGNOSIS — Z7902 Long term (current) use of antithrombotics/antiplatelets: Secondary | ICD-10-CM | POA: Insufficient documentation

## 2022-06-24 DIAGNOSIS — Z9049 Acquired absence of other specified parts of digestive tract: Secondary | ICD-10-CM | POA: Insufficient documentation

## 2022-06-24 DIAGNOSIS — I110 Hypertensive heart disease with heart failure: Secondary | ICD-10-CM | POA: Insufficient documentation

## 2022-06-24 DIAGNOSIS — Z79899 Other long term (current) drug therapy: Secondary | ICD-10-CM | POA: Insufficient documentation

## 2022-06-24 DIAGNOSIS — C3492 Malignant neoplasm of unspecified part of left bronchus or lung: Secondary | ICD-10-CM | POA: Diagnosis not present

## 2022-06-24 DIAGNOSIS — R0609 Other forms of dyspnea: Secondary | ICD-10-CM | POA: Diagnosis not present

## 2022-06-24 DIAGNOSIS — C3412 Malignant neoplasm of upper lobe, left bronchus or lung: Secondary | ICD-10-CM | POA: Diagnosis not present

## 2022-06-24 DIAGNOSIS — R0781 Pleurodynia: Secondary | ICD-10-CM | POA: Insufficient documentation

## 2022-06-24 DIAGNOSIS — I700 Atherosclerosis of aorta: Secondary | ICD-10-CM | POA: Insufficient documentation

## 2022-06-24 IMAGING — CT CT CHEST W/O CM
2 of 4 series · 15 of 36 positions shown, 18 images · non-contrast
Comparison: Multiple prior exams most recent chest CT dated
March 12, 2020

CLINICAL DATA: Non-small cell lung cancer staging. * Tracking Code:
BO *



[Series 2: thorax · axial · 0.73mm/px · z∈[-376,-112]mm · 12 of 155 slices shown, 15 images]
[im 12/155  mediastinal]
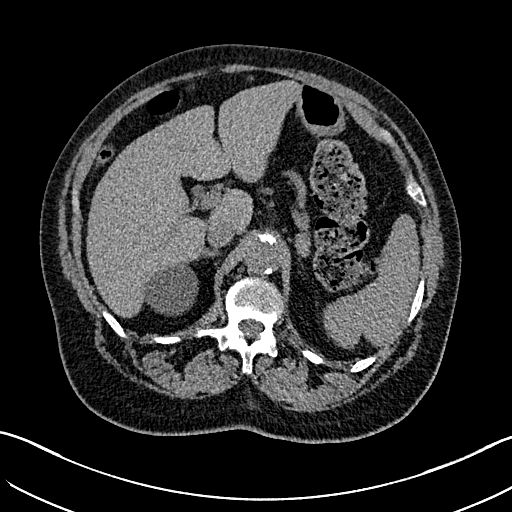
[im 12/155  lung]
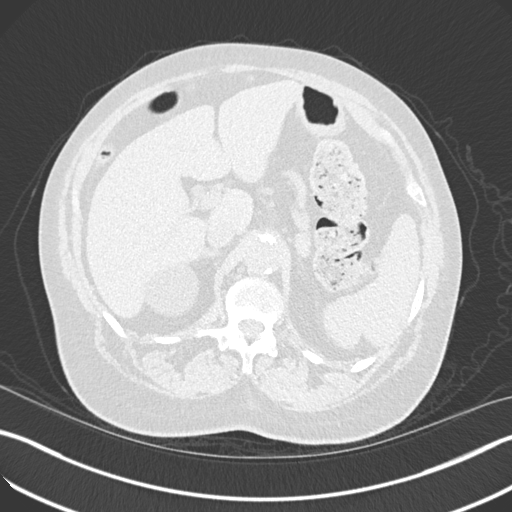
[im 23/155  lung]
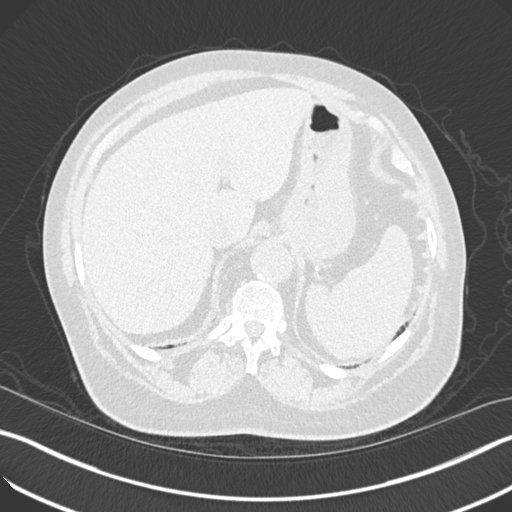
[im 34/155  lung]
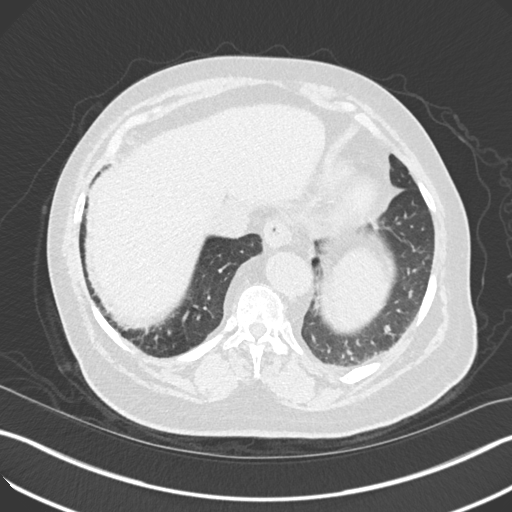
[im 45/155  lung]
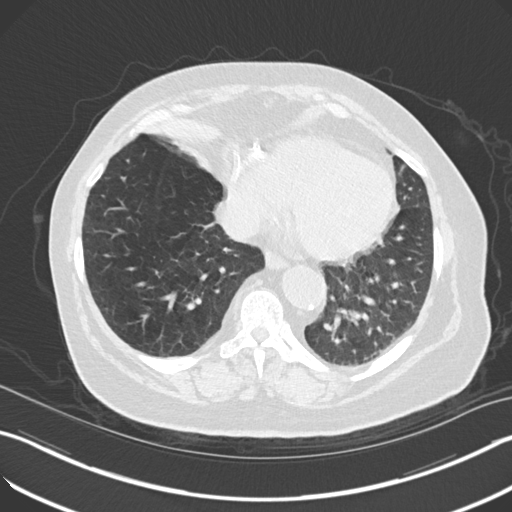
[im 56/155  mediastinal]
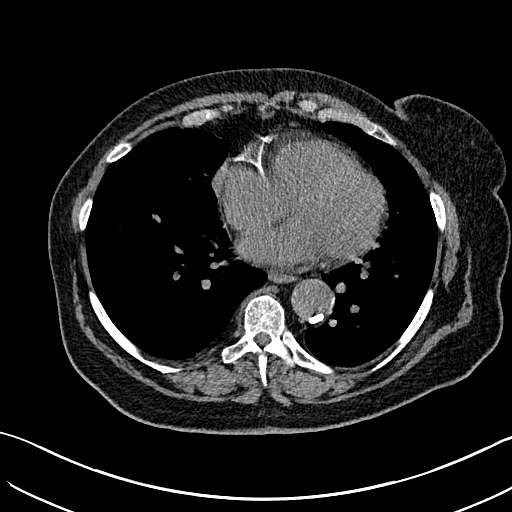
[im 56/155  lung]
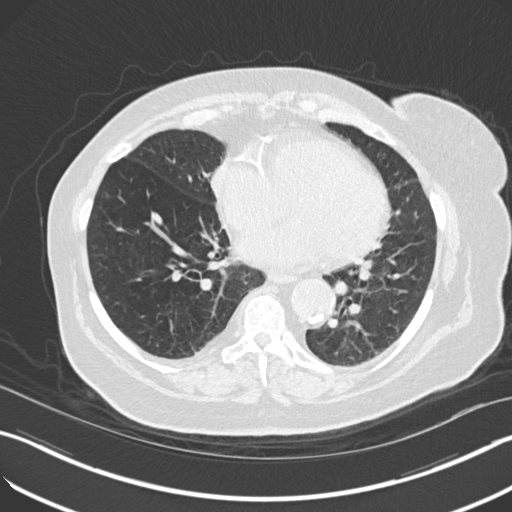
[im 67/155  lung]
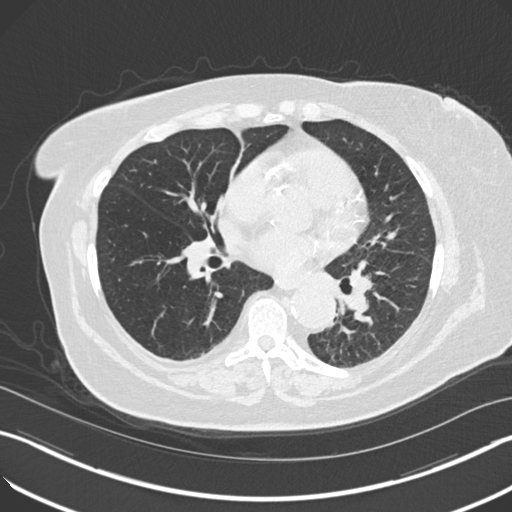
[im 89/155  lung]
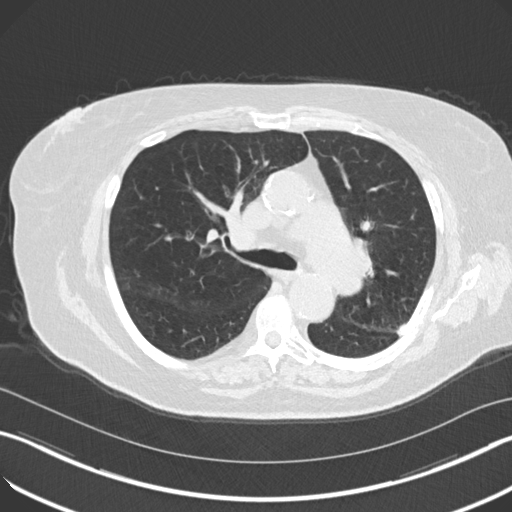
[im 100/155  lung]
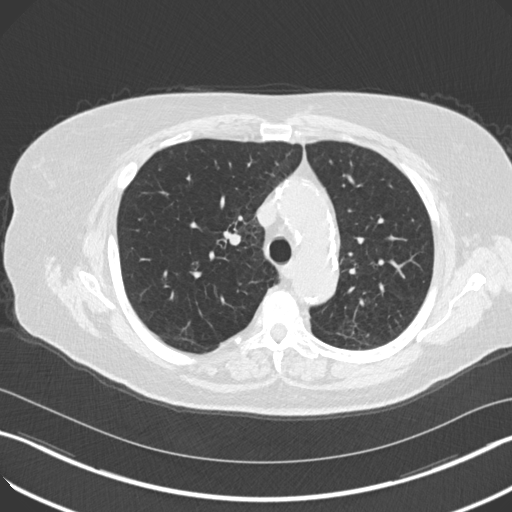
[im 111/155  mediastinal]
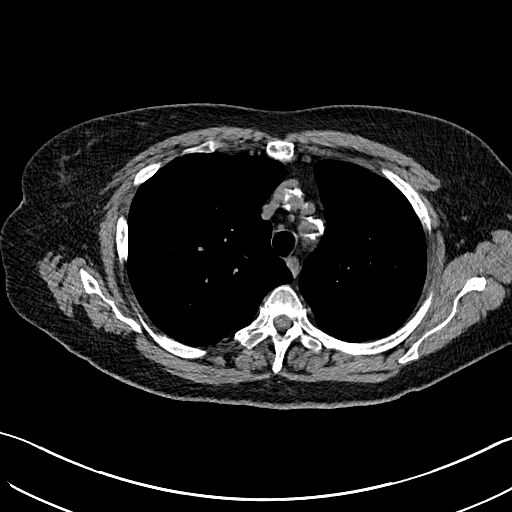
[im 111/155  lung]
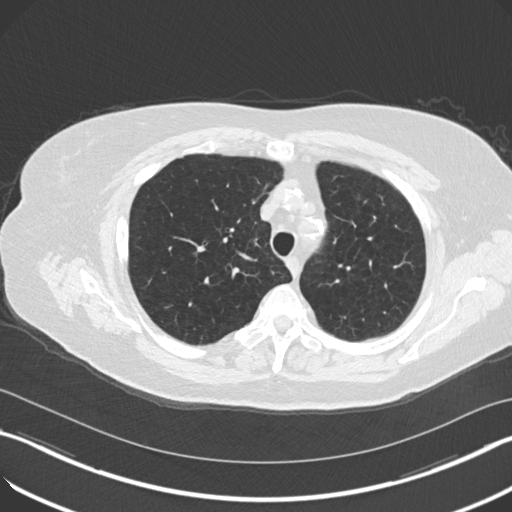
[im 122/155  lung]
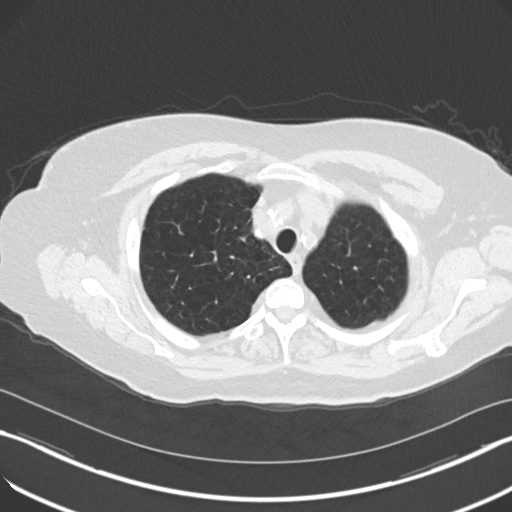
[im 133/155  lung]
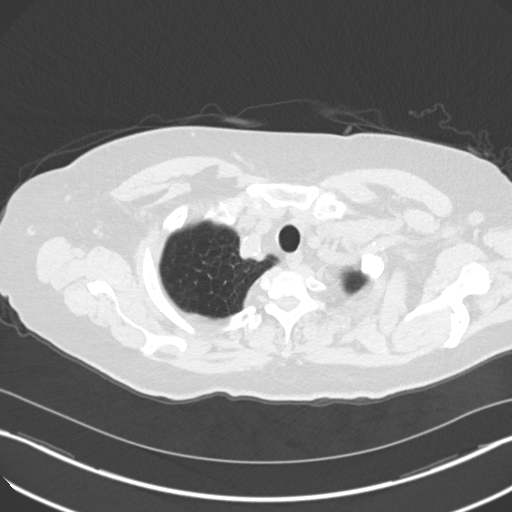
[im 144/155  lung]
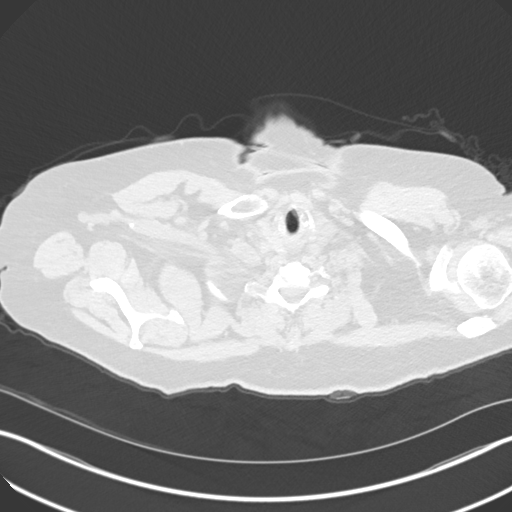

[Series 5: coronal · coronal · 0.61mm/px · 3 of 136 slices shown]
[im 28/136  lung]
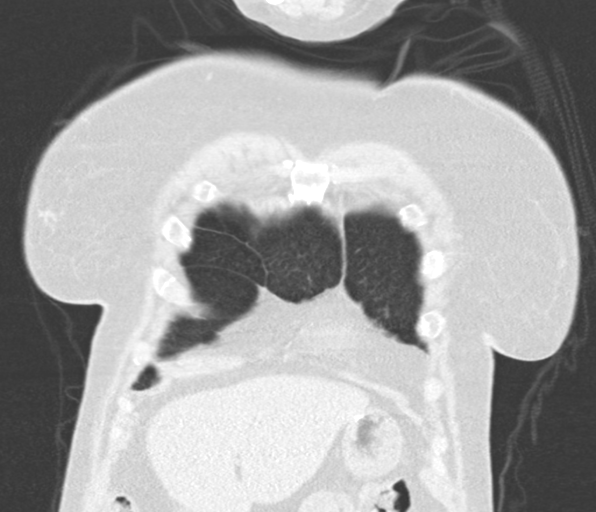
[im 55/136  lung]
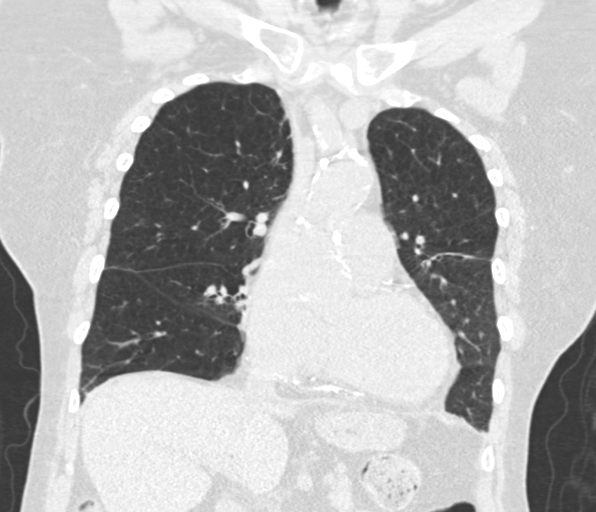
[im 82/136  lung]
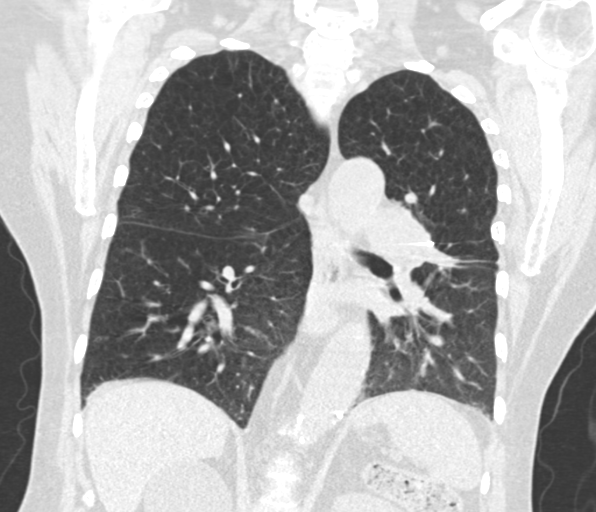

[15 of 36 positions shown; findings below may reference images not displayed]

FINDINGS: Cardiovascular: Normal heart size. No pericardial effusion. Left
main and three-vessel coronary artery calcifications.
Atherosclerotic disease of the thoracic aorta.

Mediastinum/Nodes: Small hiatal hernia. Thyroid is unremarkable. No
pathologically enlarged lymph nodes seen in the chest.

Lungs/Pleura: Central airways are patent. Centrilobular emphysema.
Left paramediastinal linear opacities are unchanged when compared
with prior exam likely post radiation change. Additional linear
opacity of the right upper lobe a right middle lobe is unchanged
when compared to prior exam and likely due to scarring. No
suspicious pulmonary nodules.

Upper Abdomen: Cholecystectomy clips. Partially imaged cystic lesion
of the right kidney. No acute abnormality.

Musculoskeletal: Old posterolateral left rib fracture.
IMPRESSION: 1. Stable left paramediastinal post radiation change. No evidence of
recurrent or metastatic disease.
2. Aortic Atherosclerosis ([id number].T) and Emphysema ([id number]).

## 2022-06-24 NOTE — Telephone Encounter (Signed)
Called patient to schedule a consultation w. Dr. Roselind Messier. No answer, VM not setup, unable to LVM.

## 2022-06-28 NOTE — Progress Notes (Signed)
Thoracic Location of Tumor / Histology: Adenocarcinoma of left lung, stage 1  CT Chest Wo Contrast 04/20/2022  IMPRESSION: Posttreatment changes involving the left lung with a developing soft tissue nodule in this location measuring 15 mm. Recommend further evaluation. Recommend further evaluation with the patient's clinical history. PET-CT scan may be of benefit as 1 option. Three-month short-term follow-up may also be an option. This recommendation follows the consensus statement: Guidelines for Management of Incidental Pulmonary Nodules Detected on CT Images: From the Fleischner Society 2017; Radiology 2017; 284:228-243.   No developing lymph node enlargement.   New mild compression of the superior endplate of T6 with sclerosis. Previous compression has been regarding interval maturity in the upper lumbar spine at L1. Please correlate with clinical history.   Aortic Atherosclerosis (ICD10-I70.0) and Emphysema (ICD10-J43.9).  05/20/2022  NM PET Image Restage (PS) Skull Base to Thigh  CHEST: Radiation changes in the central left upper lobe/perihilar region. Underlying 13 mm lingular nodule (series 7/image 35), max SUV 5.5, suspicious for recurrence.  IMPRESSION: Radiation changes in the left hemithorax. Underlying 13 mm lingular nodule, suspicious for recurrence.   Additional ancillary findings as above.  Patient presented with symptoms of: Found on CT,  She continues to have left-sided chest discomfort which Dr. Arbutus Ped previously felt may be secondary to a compressions fracture on T6. She states the pain "comes and goes"   Biopsies revealed:  04-01-22      Tobacco/Marijuana/Snuff/ETOH use: former smoker, quit in 2017  Past/Anticipated interventions by cardiothoracic surgery, if any:  Video Bronchoscopy with Electromagnetic Navigation Procedure Note   Date of Operation: 03/31/2016   Pre-op Diagnosis: LUL nodule   Post-op Diagnosis: same   Surgeon: Levy Pupa    Past/Anticipated interventions by medical oncology, if any:  Heilingoetter, Cassandra L, PA-C 06/24/2022   DIAGNOSIS: Stage IA (T1a, N0, M0) non-small cell lung cancer, adenocarcinoma presented with left upper lobe lung nodule diagnosed in March 2018.   ASSESSMENT/PLAN:  This is a very pleasant 77 year old African-American female with history of stage Ia non-small cell lung cancer, adenocarcinoma.  She presented with left upper lobe lung nodule.   She is status post stereotactic radiotherapy to the left upper lobe nodule.   The patient was seen on 04/29/2022 at that time, her scan showed new soft tissue nodule developing in the left lung in the area and the posttreatment changes concerning for disease recurrence.   Therefore, Dr. Arbutus Ped recommended a PET scan to further evaluate this.   The patient was seen with Dr. Arbutus Ped today.  Dr. Arbutus Ped personally independently reviewed the scan discussed results with the patient today.  The scan showed radiation changes in the left hemithorax with an underlying 13 mm lingular nodule suspicious for recurrence.   Dr. Arbutus Ped recommends we referral to radiation oncology to see if she is a candidate for additional radiation to this area.   If so, then Dr. Arbutus Ped recommends that we see her back for follow-up visit in 4 months with a repeat CT scan at that time.  Signs/Symptoms Weight changes, if any: no Respiratory complaints, if any: yes, shortness of breath from COPD and weather changes Hemoptysis, if any: no Pain issues, if any:  yes, pt has chronic back pain  SAFETY ISSUES: Prior radiation? Yes, SBRT to the left lung nodule under the care of Dr. Roselind Messier completed on 05/04/2016.  Pacemaker/ICD? no  Possible current pregnancy?no Is the patient on methotrexate? no  Current Complaints / other details:  No concerns or questions at this  time. Pt is accepting of her diagnosis and ready to get started.

## 2022-07-03 ENCOUNTER — Other Ambulatory Visit: Payer: Self-pay | Admitting: Internal Medicine

## 2022-07-08 NOTE — Progress Notes (Signed)
Radiation Oncology         [phone] ________________________________  Initial Outpatient Consultation  Name: Suzanne Dickson MRN: [id number]  Date: 07/12/2022  DOB: 07/19/1945  QM:VHQI, Carolan Shiver, FNP  Si Gaul, MD   REFERRING PHYSICIAN: Si Gaul, MD  DIAGNOSIS: The primary encounter diagnosis was Malignant neoplasm of upper lobe bronchus, left (HCC). A diagnosis of Adenocarcinoma of left lung, stage 1 (HCC) was also pertinent to this visit.  Adenocarcinoma of the left lung diagnosed in 2018 - now with an isolated recurrence in the left lung in March 2024 (non-biopsy proven)   Cancer Staging  Adenocarcinoma of left lung, stage 1 (HCC) Staging form: Lung, AJCC 8th Edition - Clinical: Stage IA1 (cT1a, cN0, cM0) - Signed by Si Gaul, MD on 03/17/2020  HISTORY OF PRESENT ILLNESS::Suzanne Dickson is a 77 y.o. female who is accompanied by her husband. she is seen as a courtesy of Dr. Arbutus Ped for an opinion concerning radiation therapy as part of management for her recently diagnosed recurrent left lung cancer. She is known to me for her history of left lung cancer diagnosed in 2018, s/p radiation therapy. She was last seen here for follow-up on 02/24/2017 and continued to meet with Dr. Arbutus Ped under observation with routine surveillance imaging. She remained without evidence of disease recurrence until her present recurrence in the left lung.   Several months ago, the patient presented for a routine chest CT on 04/20/22 which demonstrated a developing soft tissue nodule along the posterior aspect of the left upper lobe measuring 15 mm. Posttreatment changes involving the left lung were also appreciated. CT otherwise showed no evidence of lymphadenopathy.  Subsequently, Dr. Arbutus Ped ordered a PET scan (performed on 05/20/22) which demonstrated an underlying 13 mm lingular nodule, suspicious for recurrence, with an SUV max of 5.5. PET otherwise showed no evidence of  metastatic disease.   Prior to undergoing further work-up, the patient was hospitalized from 05/24/22 through 05/31/22 with encephalopathy. Upon further evaluation, she was found to have severe sepsis due to presumed left-sided pyelonephritis with gram-negative bacteremia. She was subsequently transferred to the ICU on 4/30 for further management with improvement in her symptoms. Imaging performed while inpatient includes:  -- CT of the head on 05/24/22 (in the setting of altered mental status) showed no evidence of intracranial metastatic disease with chronic small vessel ischemic disease and cerebral atrophy.  -- CT of the abdomen and pelvis on 04/29 showed: findings consistent with possible pyelonephritis; separate bilateral benign-appearing renal cysts; multilevel compression deformities along the spine but with somewhat sclerotic margins; and extensive vascular calcifications including the aorta and branch vessels.  -- Chest x-ray on 05/25/22 showed increased bibasilar predominant interstitial opacities including pulmonary edema vs atypical infection.   For her isolated site of disease recurrence in the left lung, Dr. Arbutus Ped recommends curative radiation therapy which we will discuss in detail today. She will return to Dr. Arbutus Ped in 4 months for evaluation and repeat CT scan of the chest for restaging of her disease after radiotherapy.   She also continues to follow with Dr. Sherene Sires at [facility] for COPD.  . On evaluation today the patient denies any pain within the chest area significant cough or hemoptysis.  She reports her breathing has been relatively stable over the past 2 years.  Her oxygen use fluctuates from 4 to 7 L depending on how she feels and activity.    PREVIOUS RADIATION THERAPY: Yes   05/04/16-05/12/16: 54 Gy to the left lung  in 3 fractions   PAST MEDICAL HISTORY:  Past Medical History:  Diagnosis Date   Adenocarcinoma of left lung, 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   "I'm in  remission" (04/05/2017)   Adenocarcinoma, lung (HCC)    Anxiety    Arthritis    "legs, elbows" (04/05/2017)   Bronchitis, chronic (HCC)    "haven't had it in awhile" (04/05/2017)   CHF (congestive heart failure) (HCC)    COPD (chronic obstructive pulmonary disease) (HCC)    Coronary artery disease    Daily headache    "recently" (04/05/2017)   Depression    Dyspnea    with exertion   GERD (gastroesophageal reflux disease)    History of blood transfusion 1950s   History of radiation therapy 05/04/16 - 05/12/16   Left lung treated to 54 Gy with 3 fx of 18 Gy   Hypertension    On home oxygen therapy    "2L; 24/7" (04/05/2017)   OSA (obstructive sleep apnea)    OSA on CPAP    Pneumonia 10/2015   "at least 3 times" (04/05/2017)   Pulmonary hypertension (HCC)     PAST SURGICAL HISTORY: Past Surgical History:  Procedure Laterality Date   COLONOSCOPY     CORONARY ANGIOPLASTY WITH STENT PLACEMENT  04/05/2017   CORONARY STENT INTERVENTION N/A 04/05/2017   Procedure: CORONARY STENT INTERVENTION;  Surgeon: Rinaldo Cloud, MD;  Location: [facility];  Service: Cardiovascular;  Laterality: N/A;   DILATION AND CURETTAGE OF UTERUS     FUDUCIAL PLACEMENT Left 03/31/2016   Procedure: PLACEMENT OF FUDUCIAL LEFT UPPER LOBE;  Surgeon: Leslye Peer, MD;  Location: [facility] OR;  Service: Thoracic;  Laterality: Left;   LAPAROSCOPIC CHOLECYSTECTOMY     LEFT HEART CATH AND CORONARY ANGIOGRAPHY N/A 02/17/2017   Procedure: LEFT HEART CATH AND CORONARY ANGIOGRAPHY;  Surgeon: Rinaldo Cloud, MD;  Location: [facility];  Service: Cardiovascular;  Laterality: N/A;   SINUSOTOMY     TONSILLECTOMY     TUBAL LIGATION     VIDEO BRONCHOSCOPY WITH ENDOBRONCHIAL NAVIGATION N/A 03/31/2016   Procedure: VIDEO BRONCHOSCOPY WITH ENDOBRONCHIAL NAVIGATION;  Surgeon: Leslye Peer, MD;  Location: [facility] OR;  Service: Thoracic;  Laterality: N/A;    FAMILY HISTORY:  Family History  Problem Relation Age of Onset   Heart  attack Mother    Heart attack Father     SOCIAL HISTORY:  Social History   Tobacco Use   Smoking status: Former    Packs/day: 1.00    Years: 40.00    Additional pack years: 0.00    Total pack years: 40.00    Types: Cigarettes    Quit date: 09/02/2014    Years since quitting: 7.8   Smokeless tobacco: Never  Vaping Use   Vaping Use: Never used  Substance Use Topics   Alcohol use: No    Alcohol/week: 0.0 standard drinks of alcohol   Drug use: Never    ALLERGIES: No Known Allergies  MEDICATIONS:  Current Outpatient Medications  Medication Sig Dispense Refill   acetaminophen (TYLENOL) 500 MG tablet Take 1 tablet (500 mg total) by mouth every 6 (six) hours as needed. 30 tablet 0   albuterol (PROVENTIL) (2.5 MG/3ML) 0.083% nebulizer solution Use every 4 hours and as needed for shortness of breath. 180 mL 11   albuterol (VENTOLIN HFA) 108 (90 Base) MCG/ACT inhaler INHALE 2 PUFFS BY MOUTH EVERY 4 HOURS AS NEEDED FOR WHEEZING FOR SHORTNESS OF BREATH 18 g 2  amLODipine (NORVASC) 5 MG tablet Take 5 mg by mouth daily.     aspirin EC 81 MG tablet Take 81 mg by mouth daily. Swallow whole.     atorvastatin (LIPITOR) 20 MG tablet Take 20 mg by mouth at bedtime.     Azelastine-Fluticasone 137-50 MCG/ACT SUSP 2 PUFFS INTO EACH NOSTRIL AT BEDTIME 23 g 3   cetirizine (ZYRTEC) 10 MG tablet Take 10 mg by mouth at bedtime.     cholecalciferol (VITAMIN D3) 25 MCG (1000 UNIT) tablet Take 1,000 Units by mouth daily.     clopidogrel (PLAVIX) 75 MG tablet Take 75 mg by mouth every morning.      Cyanocobalamin (B-12 PO) Take 1 tablet by mouth daily.     empagliflozin (JARDIANCE) 10 MG TABS tablet Take 10 mg by mouth daily.     ferrous sulfate 325 (65 FE) MG EC tablet Take 325 mg by mouth 3 (three) times daily with meals.      losartan (COZAAR) 50 MG tablet Take 50 mg by mouth every morning.     methocarbamol (ROBAXIN) 500 MG tablet Take by mouth.     metoprolol succinate (TOPROL-XL) 25 MG 24 hr  tablet Take 25 mg by mouth daily.     nitroGLYCERIN (NITRODUR - DOSED IN MG/24 HR) 0.4 mg/hr patch Place 0.4 mg onto the skin every morning.  3   nitroGLYCERIN (NITROSTAT) 0.4 MG SL tablet Place 0.4 mg under the tongue every 5 (five) minutes as needed for chest pain.     OVER THE COUNTER MEDICATION Take 1 tablet by mouth at bedtime. OTC sleep aid - unknown ingredients     OVER THE COUNTER MEDICATION Take 3,000 mcg by mouth daily. Vitamin B12 tablet daily     OXYGEN Inhale 4-6 L into the lungs continuous.     pantoprazole (PROTONIX) 40 MG tablet Take 30-60 min before first meal of the day (Patient taking differently: Take 40 mg by mouth daily before breakfast.) 90 tablet 3   Polyvinyl Alcohol-Povidone (REFRESH OP) Place 1 drop into both eyes daily as needed (dry eyes).     predniSONE (DELTASONE) 10 MG tablet Start on 5/10 100 tablet 0   TRELEGY ELLIPTA 100-62.5-25 MCG/ACT AEPB INHALE 1 PUFF ONCE DAILY 60 each 0   No current facility-administered medications for this encounter.    REVIEW OF SYSTEMS:  A 10+ POINT REVIEW OF SYSTEMS WAS OBTAINED including neurology, dermatology, psychiatry, cardiac, respiratory, lymph, extremities, GI, GU, musculoskeletal, constitutional, reproductive, HEENT.  Pertinent findings as above and HPI   PHYSICAL EXAM:  height is 5\' 2"  (1.575 m) and weight is 158 lb 6.4 oz (71.8 kg). Her temperature is 98.5 F (36.9 C). Her blood pressure is 165/75 (abnormal) and her pulse is 90. Her respiration is 18 and oxygen saturation is 100%.   General: Alert and oriented, in no acute distress, remains in wheelchair for evaluation.  Oxygen in place at 4 L. HEENT: Head is normocephalic. Extraocular movements are intact.  Neck: Neck is supple, no palpable cervical or supraclavicular lymphadenopathy. Heart: Regular in rate and rhythm with no murmurs, rubs, or gallops. Chest: The lung exam reveals clear right lung.  Wheezing is appreciated at throughout much of the left  lung. Abdomen: Soft, nontender, nondistended, with no rigidity or guarding. Extremities: No cyanosis or edema. Lymphatics: see Neck Exam Skin: No concerning lesions. Musculoskeletal: symmetric strength and muscle tone throughout. Neurologic: Cranial nerves II through XII are grossly intact. No obvious focalities. Speech is fluent. Coordination is  intact. Psychiatric: Judgment and insight are intact. Affect is appropriate.   ECOG = 2  0 - Asymptomatic (Fully active, able to carry on all predisease activities without restriction)  1 - Symptomatic but completely ambulatory (Restricted in physically strenuous activity but ambulatory and able to carry out work of a light or sedentary nature. For example, light housework, office work)  2 - Symptomatic, <50% in bed during the day (Ambulatory and capable of all self care but unable to carry out any work activities. Up and about more than 50% of waking hours)  3 - Symptomatic, >50% in bed, but not bedbound (Capable of only limited self-care, confined to bed or chair 50% or more of waking hours)  4 - Bedbound (Completely disabled. Cannot carry on any self-care. Totally confined to bed or chair)  5 - Death   Santiago Glad MM, Creech RH, Tormey DC, et al. [phone]). "Toxicity and response criteria of the [facility]". Am. Evlyn Clines. Oncol. 5 (6): 649-55  LABORATORY DATA:  Lab Results  Component Value Date   WBC 7.3 05/28/2022   HGB 9.3 (L) 05/28/2022   HCT 31.2 (L) 05/28/2022   MCV 102.6 (H) 05/28/2022   PLT 175 05/28/2022   NEUTROABS 14.1 (H) 05/24/2022   Lab Results  Component Value Date   NA 139 05/28/2022   K 3.7 05/28/2022   CL 102 05/28/2022   CO2 28 05/28/2022   GLUCOSE 122 (H) 05/28/2022   BUN 18 05/28/2022   CREATININE 1.14 (H) 05/31/2022   CALCIUM 8.9 05/28/2022      RADIOGRAPHY: No results found.    IMPRESSION: Adenocarcinoma of the left lung diagnosed in 2018 - now with an isolated recurrence in the left  lung in March 2024 (non-biopsy proven)  Today we reviewed the patient's previous treatment as it relates to her current situation.  This new area appears to be adjacent to her previous radiation treatment.  We discussed potential biopsy but patient reports her cardiologist does not recommend she be put to sleep.  We discussed options for management including close observation with the understanding that this lesion would continue to enlarge likely cause problems over the next several months.  We discussed that she would likely not be a good candidate for SBRT given her previous treatment in this general area but potentially a candidate for conventional radiation therapy or ultra hypofractionated radiation treatment since there is only a small area of recurrence.  Given her overall performance status she does not feel that she could complete a extended course of radiation therapy over 6 weeks.  We discussed potential side effects of this treatment with anticipated long-term toxicities.  She does understand that there is a higher risk for complications given her previous radiation treatment in this area.  In rare situations this could result in uncontrolled bleeding and death.  She understands this issue well.  She also understands that her breathing could worsen with this treatment.  However this area does have scar tissue from her previous treatment and I do not believe she is getting a lot of lung function from this portion of her lung at this time.  After careful consideration the patient would like to proceed with radiation therapy using the ultra hypofractionated treatment regimen.   PLAN: Return for CT simulation on Thursday, June 20 at 8 AM.  Treatments to begin in 7-10 working days later.  Anticipate 10 treatments directed at the area of recurrence in the left lung.   35 minutes of  total time was spent for this patient encounter, including preparation, face-to-face counseling with the patient and  coordination of care, physical exam, and documentation of the encounter.   ------------------------------------------------  Billie Lade, PhD, MD  This document serves as a record of services personally performed by Antony Blackbird, MD. It was created on his behalf by Neena Rhymes, a trained medical scribe. The creation of this record is based on the scribe's personal observations and the provider's statements to them. This document has been checked and approved by the attending provider.

## 2022-07-09 ENCOUNTER — Encounter: Payer: Self-pay | Admitting: Radiation Oncology

## 2022-07-09 ENCOUNTER — Telehealth: Payer: Self-pay

## 2022-07-09 NOTE — Telephone Encounter (Signed)
RN called pt to obtain meaningful use and nurse evaluation information. Consult note completed and routed to Dr. Roselind Messier for review. Pt was doing well with no needs.

## 2022-07-12 ENCOUNTER — Ambulatory Visit
Admission: RE | Admit: 2022-07-12 | Discharge: 2022-07-12 | Disposition: A | Discharge status: Home / Self Care | Payer: Medicare PPO | Source: Ambulatory Visit | Attending: Radiation Oncology | Admitting: Radiation Oncology

## 2022-07-12 ENCOUNTER — Other Ambulatory Visit: Payer: Self-pay

## 2022-07-12 VITALS — BP 165/75 | HR 90 | Temp 98.5°F | Resp 18 | Ht 62.0 in | Wt 158.4 lb

## 2022-07-12 DIAGNOSIS — Z7982 Long term (current) use of aspirin: Secondary | ICD-10-CM | POA: Diagnosis not present

## 2022-07-12 DIAGNOSIS — F17210 Nicotine dependence, cigarettes, uncomplicated: Secondary | ICD-10-CM | POA: Diagnosis not present

## 2022-07-12 DIAGNOSIS — Z7984 Long term (current) use of oral hypoglycemic drugs: Secondary | ICD-10-CM | POA: Insufficient documentation

## 2022-07-12 DIAGNOSIS — Z923 Personal history of irradiation: Secondary | ICD-10-CM | POA: Diagnosis not present

## 2022-07-12 DIAGNOSIS — I2510 Atherosclerotic heart disease of native coronary artery without angina pectoris: Secondary | ICD-10-CM | POA: Insufficient documentation

## 2022-07-12 DIAGNOSIS — C3412 Malignant neoplasm of upper lobe, left bronchus or lung: Secondary | ICD-10-CM | POA: Insufficient documentation

## 2022-07-12 DIAGNOSIS — J449 Chronic obstructive pulmonary disease, unspecified: Secondary | ICD-10-CM | POA: Diagnosis not present

## 2022-07-12 DIAGNOSIS — Z79899 Other long term (current) drug therapy: Secondary | ICD-10-CM | POA: Diagnosis not present

## 2022-07-12 DIAGNOSIS — K219 Gastro-esophageal reflux disease without esophagitis: Secondary | ICD-10-CM | POA: Insufficient documentation

## 2022-07-12 DIAGNOSIS — I509 Heart failure, unspecified: Secondary | ICD-10-CM | POA: Diagnosis not present

## 2022-07-12 DIAGNOSIS — Z87891 Personal history of nicotine dependence: Secondary | ICD-10-CM | POA: Diagnosis not present

## 2022-07-12 DIAGNOSIS — Z7902 Long term (current) use of antithrombotics/antiplatelets: Secondary | ICD-10-CM | POA: Insufficient documentation

## 2022-07-12 DIAGNOSIS — C3492 Malignant neoplasm of unspecified part of left bronchus or lung: Secondary | ICD-10-CM

## 2022-07-12 DIAGNOSIS — I110 Hypertensive heart disease with heart failure: Secondary | ICD-10-CM | POA: Insufficient documentation

## 2022-07-12 DIAGNOSIS — G4733 Obstructive sleep apnea (adult) (pediatric): Secondary | ICD-10-CM | POA: Insufficient documentation

## 2022-07-12 DIAGNOSIS — I272 Pulmonary hypertension, unspecified: Secondary | ICD-10-CM | POA: Diagnosis not present

## 2022-07-13 DIAGNOSIS — I10 Essential (primary) hypertension: Secondary | ICD-10-CM | POA: Diagnosis not present

## 2022-07-13 DIAGNOSIS — I2721 Secondary pulmonary arterial hypertension: Secondary | ICD-10-CM | POA: Diagnosis not present

## 2022-07-13 DIAGNOSIS — E119 Type 2 diabetes mellitus without complications: Secondary | ICD-10-CM | POA: Diagnosis not present

## 2022-07-13 DIAGNOSIS — E876 Hypokalemia: Secondary | ICD-10-CM | POA: Diagnosis not present

## 2022-07-13 DIAGNOSIS — J9621 Acute and chronic respiratory failure with hypoxia: Secondary | ICD-10-CM | POA: Diagnosis not present

## 2022-07-13 DIAGNOSIS — C3492 Malignant neoplasm of unspecified part of left bronchus or lung: Secondary | ICD-10-CM | POA: Diagnosis not present

## 2022-07-13 DIAGNOSIS — J9622 Acute and chronic respiratory failure with hypercapnia: Secondary | ICD-10-CM | POA: Diagnosis not present

## 2022-07-15 ENCOUNTER — Other Ambulatory Visit: Payer: Self-pay

## 2022-07-15 ENCOUNTER — Ambulatory Visit
Admission: RE | Admit: 2022-07-15 | Discharge: 2022-07-15 | Disposition: A | Discharge status: Home / Self Care | Payer: Medicare PPO | Source: Ambulatory Visit | Attending: Radiation Oncology | Admitting: Radiation Oncology

## 2022-07-15 DIAGNOSIS — C3492 Malignant neoplasm of unspecified part of left bronchus or lung: Secondary | ICD-10-CM

## 2022-07-15 DIAGNOSIS — C3412 Malignant neoplasm of upper lobe, left bronchus or lung: Secondary | ICD-10-CM | POA: Diagnosis not present

## 2022-07-15 DIAGNOSIS — F17210 Nicotine dependence, cigarettes, uncomplicated: Secondary | ICD-10-CM | POA: Diagnosis not present

## 2022-07-20 DIAGNOSIS — J42 Unspecified chronic bronchitis: Secondary | ICD-10-CM | POA: Diagnosis not present

## 2022-07-20 DIAGNOSIS — I10 Essential (primary) hypertension: Secondary | ICD-10-CM | POA: Diagnosis not present

## 2022-07-21 NOTE — Progress Notes (Deleted)
07/23/22- 76 yoF for sleep evaluation  for OSA/ CPAP Medical problems include HTN, ASCVD, CAD/angina, CHF, Pulmonary HTN, Chronic Rhinosinusitis, Chronic Resp Failure Hypoxia, GERD,  I had seen her in 2011 with skin testing for allergic rhinitis. Now following with Dr Sherene Sires for COPD GOLD III O2 dep, AdenoCa L Lung,  Epworth score- Body weight today- ? Sleep study ?CPAP

## 2022-07-23 ENCOUNTER — Institutional Professional Consult (permissible substitution): Payer: Medicare PPO | Admitting: Internal Medicine

## 2022-07-27 ENCOUNTER — Other Ambulatory Visit: Payer: Self-pay | Admitting: Internal Medicine

## 2022-07-27 DIAGNOSIS — F17210 Nicotine dependence, cigarettes, uncomplicated: Secondary | ICD-10-CM | POA: Diagnosis not present

## 2022-07-27 DIAGNOSIS — C3492 Malignant neoplasm of unspecified part of left bronchus or lung: Secondary | ICD-10-CM | POA: Diagnosis not present

## 2022-07-27 DIAGNOSIS — C3412 Malignant neoplasm of upper lobe, left bronchus or lung: Secondary | ICD-10-CM | POA: Diagnosis not present

## 2022-08-02 ENCOUNTER — Other Ambulatory Visit: Payer: Self-pay

## 2022-08-02 ENCOUNTER — Ambulatory Visit
Admission: RE | Admit: 2022-08-02 | Discharge: 2022-08-02 | Disposition: A | Discharge status: Home / Self Care | Payer: Medicare PPO | Source: Ambulatory Visit | Attending: Radiation Oncology | Admitting: Radiation Oncology

## 2022-08-02 DIAGNOSIS — Z510 Encounter for antineoplastic radiation therapy: Secondary | ICD-10-CM | POA: Diagnosis not present

## 2022-08-02 DIAGNOSIS — C3492 Malignant neoplasm of unspecified part of left bronchus or lung: Secondary | ICD-10-CM | POA: Diagnosis not present

## 2022-08-02 DIAGNOSIS — C3412 Malignant neoplasm of upper lobe, left bronchus or lung: Secondary | ICD-10-CM | POA: Diagnosis not present

## 2022-08-02 DIAGNOSIS — F17210 Nicotine dependence, cigarettes, uncomplicated: Secondary | ICD-10-CM | POA: Diagnosis not present

## 2022-08-02 LAB — RAD ONC ARIA SESSION SUMMARY
Course Elapsed Days: 0
Plan Fractions Treated to Date: 1
Plan Prescribed Dose Per Fraction: 5 Gy
Plan Total Fractions Prescribed: 10
Plan Total Prescribed Dose: 50 Gy
Reference Point Dosage Given to Date: 5 Gy
Reference Point Session Dosage Given: 5 Gy
Session Number: 1

## 2022-08-03 ENCOUNTER — Ambulatory Visit
Admission: RE | Admit: 2022-08-03 | Discharge: 2022-08-03 | Discharge status: Home / Self Care | Payer: Medicare PPO | Source: Ambulatory Visit | Attending: Radiation Oncology | Admitting: Radiation Oncology

## 2022-08-03 ENCOUNTER — Ambulatory Visit
Admission: RE | Admit: 2022-08-03 | Discharge: 2022-08-03 | Disposition: A | Discharge status: Home / Self Care | Payer: Medicare PPO | Source: Ambulatory Visit | Attending: Radiation Oncology | Admitting: Radiation Oncology

## 2022-08-03 ENCOUNTER — Other Ambulatory Visit: Payer: Self-pay

## 2022-08-03 DIAGNOSIS — F17210 Nicotine dependence, cigarettes, uncomplicated: Secondary | ICD-10-CM | POA: Diagnosis not present

## 2022-08-03 DIAGNOSIS — C3492 Malignant neoplasm of unspecified part of left bronchus or lung: Secondary | ICD-10-CM | POA: Diagnosis not present

## 2022-08-03 DIAGNOSIS — Z510 Encounter for antineoplastic radiation therapy: Secondary | ICD-10-CM | POA: Diagnosis not present

## 2022-08-03 DIAGNOSIS — C3412 Malignant neoplasm of upper lobe, left bronchus or lung: Secondary | ICD-10-CM | POA: Diagnosis not present

## 2022-08-03 LAB — RAD ONC ARIA SESSION SUMMARY
Course Elapsed Days: 1
Plan Fractions Treated to Date: 2
Plan Prescribed Dose Per Fraction: 5 Gy
Plan Total Fractions Prescribed: 10
Plan Total Prescribed Dose: 50 Gy
Reference Point Dosage Given to Date: 10 Gy
Reference Point Session Dosage Given: 5 Gy
Session Number: 2

## 2022-08-04 ENCOUNTER — Other Ambulatory Visit: Payer: Self-pay

## 2022-08-04 ENCOUNTER — Ambulatory Visit
Admission: RE | Admit: 2022-08-04 | Discharge: 2022-08-04 | Discharge status: Home / Self Care | Payer: Medicare PPO | Source: Ambulatory Visit | Attending: Radiation Oncology | Admitting: Radiation Oncology

## 2022-08-04 DIAGNOSIS — C3412 Malignant neoplasm of upper lobe, left bronchus or lung: Secondary | ICD-10-CM | POA: Diagnosis not present

## 2022-08-04 DIAGNOSIS — F17210 Nicotine dependence, cigarettes, uncomplicated: Secondary | ICD-10-CM | POA: Diagnosis not present

## 2022-08-04 DIAGNOSIS — C3492 Malignant neoplasm of unspecified part of left bronchus or lung: Secondary | ICD-10-CM | POA: Diagnosis not present

## 2022-08-04 DIAGNOSIS — Z510 Encounter for antineoplastic radiation therapy: Secondary | ICD-10-CM | POA: Diagnosis not present

## 2022-08-04 LAB — RAD ONC ARIA SESSION SUMMARY
Course Elapsed Days: 2
Plan Fractions Treated to Date: 3
Plan Prescribed Dose Per Fraction: 5 Gy
Plan Total Fractions Prescribed: 10
Plan Total Prescribed Dose: 50 Gy
Reference Point Dosage Given to Date: 15 Gy
Reference Point Session Dosage Given: 5 Gy
Session Number: 3

## 2022-08-05 ENCOUNTER — Other Ambulatory Visit: Payer: Self-pay | Admitting: Internal Medicine

## 2022-08-05 ENCOUNTER — Other Ambulatory Visit: Payer: Self-pay

## 2022-08-05 ENCOUNTER — Ambulatory Visit
Admission: RE | Admit: 2022-08-05 | Discharge: 2022-08-05 | Disposition: A | Discharge status: Home / Self Care | Payer: Medicare PPO | Source: Ambulatory Visit | Attending: Radiation Oncology | Admitting: Radiation Oncology

## 2022-08-05 DIAGNOSIS — F17210 Nicotine dependence, cigarettes, uncomplicated: Secondary | ICD-10-CM | POA: Diagnosis not present

## 2022-08-05 DIAGNOSIS — C3492 Malignant neoplasm of unspecified part of left bronchus or lung: Secondary | ICD-10-CM | POA: Diagnosis not present

## 2022-08-05 DIAGNOSIS — C3412 Malignant neoplasm of upper lobe, left bronchus or lung: Secondary | ICD-10-CM | POA: Diagnosis not present

## 2022-08-05 DIAGNOSIS — Z510 Encounter for antineoplastic radiation therapy: Secondary | ICD-10-CM | POA: Diagnosis not present

## 2022-08-05 LAB — RAD ONC ARIA SESSION SUMMARY
Course Elapsed Days: 3
Plan Fractions Treated to Date: 4
Plan Prescribed Dose Per Fraction: 5 Gy
Plan Total Fractions Prescribed: 10
Plan Total Prescribed Dose: 50 Gy
Reference Point Dosage Given to Date: 20 Gy
Reference Point Session Dosage Given: 5 Gy
Session Number: 4

## 2022-08-06 ENCOUNTER — Ambulatory Visit
Admission: RE | Admit: 2022-08-06 | Discharge: 2022-08-06 | Disposition: A | Discharge status: Home / Self Care | Payer: Medicare PPO | Source: Ambulatory Visit | Attending: Radiation Oncology | Admitting: Radiation Oncology

## 2022-08-06 ENCOUNTER — Other Ambulatory Visit: Payer: Self-pay

## 2022-08-06 DIAGNOSIS — C3492 Malignant neoplasm of unspecified part of left bronchus or lung: Secondary | ICD-10-CM | POA: Diagnosis not present

## 2022-08-06 DIAGNOSIS — Z510 Encounter for antineoplastic radiation therapy: Secondary | ICD-10-CM | POA: Diagnosis not present

## 2022-08-06 DIAGNOSIS — F17210 Nicotine dependence, cigarettes, uncomplicated: Secondary | ICD-10-CM | POA: Diagnosis not present

## 2022-08-06 DIAGNOSIS — C3412 Malignant neoplasm of upper lobe, left bronchus or lung: Secondary | ICD-10-CM | POA: Diagnosis not present

## 2022-08-06 LAB — RAD ONC ARIA SESSION SUMMARY
Course Elapsed Days: 4
Plan Fractions Treated to Date: 5
Plan Prescribed Dose Per Fraction: 5 Gy
Plan Total Fractions Prescribed: 10
Plan Total Prescribed Dose: 50 Gy
Reference Point Dosage Given to Date: 25 Gy
Reference Point Session Dosage Given: 5 Gy
Session Number: 5

## 2022-08-09 ENCOUNTER — Other Ambulatory Visit: Payer: Self-pay

## 2022-08-09 ENCOUNTER — Ambulatory Visit
Admission: RE | Admit: 2022-08-09 | Discharge: 2022-08-09 | Disposition: A | Discharge status: Home / Self Care | Payer: Medicare PPO | Source: Ambulatory Visit | Attending: Radiation Oncology | Admitting: Radiation Oncology

## 2022-08-09 DIAGNOSIS — C3412 Malignant neoplasm of upper lobe, left bronchus or lung: Secondary | ICD-10-CM | POA: Diagnosis not present

## 2022-08-09 DIAGNOSIS — C3492 Malignant neoplasm of unspecified part of left bronchus or lung: Secondary | ICD-10-CM | POA: Diagnosis not present

## 2022-08-09 DIAGNOSIS — F17210 Nicotine dependence, cigarettes, uncomplicated: Secondary | ICD-10-CM | POA: Diagnosis not present

## 2022-08-09 DIAGNOSIS — Z510 Encounter for antineoplastic radiation therapy: Secondary | ICD-10-CM | POA: Diagnosis not present

## 2022-08-09 LAB — RAD ONC ARIA SESSION SUMMARY
Course Elapsed Days: 7
Plan Fractions Treated to Date: 6
Plan Prescribed Dose Per Fraction: 5 Gy
Plan Total Fractions Prescribed: 10
Plan Total Prescribed Dose: 50 Gy
Reference Point Dosage Given to Date: 30 Gy
Reference Point Session Dosage Given: 5 Gy
Session Number: 6

## 2022-08-10 ENCOUNTER — Other Ambulatory Visit: Payer: Self-pay

## 2022-08-10 ENCOUNTER — Ambulatory Visit
Admission: RE | Admit: 2022-08-10 | Discharge: 2022-08-10 | Disposition: A | Discharge status: Home / Self Care | Payer: Medicare PPO | Source: Ambulatory Visit | Attending: Radiation Oncology | Admitting: Radiation Oncology

## 2022-08-10 DIAGNOSIS — J9620 Acute and chronic respiratory failure, unspecified whether with hypoxia or hypercapnia: Secondary | ICD-10-CM | POA: Diagnosis not present

## 2022-08-10 DIAGNOSIS — N179 Acute kidney failure, unspecified: Secondary | ICD-10-CM | POA: Diagnosis present

## 2022-08-10 DIAGNOSIS — F32A Depression, unspecified: Secondary | ICD-10-CM | POA: Diagnosis present

## 2022-08-10 DIAGNOSIS — J9621 Acute and chronic respiratory failure with hypoxia: Secondary | ICD-10-CM | POA: Diagnosis present

## 2022-08-10 DIAGNOSIS — C3412 Malignant neoplasm of upper lobe, left bronchus or lung: Secondary | ICD-10-CM | POA: Diagnosis not present

## 2022-08-10 DIAGNOSIS — R0689 Other abnormalities of breathing: Secondary | ICD-10-CM | POA: Diagnosis not present

## 2022-08-10 DIAGNOSIS — I130 Hypertensive heart and chronic kidney disease with heart failure and stage 1 through stage 4 chronic kidney disease, or unspecified chronic kidney disease: Secondary | ICD-10-CM | POA: Diagnosis present

## 2022-08-10 DIAGNOSIS — J432 Centrilobular emphysema: Secondary | ICD-10-CM | POA: Diagnosis not present

## 2022-08-10 DIAGNOSIS — Z1152 Encounter for screening for COVID-19: Secondary | ICD-10-CM | POA: Diagnosis not present

## 2022-08-10 DIAGNOSIS — N1832 Chronic kidney disease, stage 3b: Secondary | ICD-10-CM | POA: Diagnosis present

## 2022-08-10 DIAGNOSIS — Z510 Encounter for antineoplastic radiation therapy: Secondary | ICD-10-CM | POA: Diagnosis not present

## 2022-08-10 DIAGNOSIS — N12 Tubulo-interstitial nephritis, not specified as acute or chronic: Secondary | ICD-10-CM | POA: Diagnosis present

## 2022-08-10 DIAGNOSIS — J9622 Acute and chronic respiratory failure with hypercapnia: Secondary | ICD-10-CM | POA: Diagnosis present

## 2022-08-10 DIAGNOSIS — E669 Obesity, unspecified: Secondary | ICD-10-CM | POA: Diagnosis present

## 2022-08-10 DIAGNOSIS — K5730 Diverticulosis of large intestine without perforation or abscess without bleeding: Secondary | ICD-10-CM | POA: Diagnosis not present

## 2022-08-10 DIAGNOSIS — A4151 Sepsis due to Escherichia coli [E. coli]: Secondary | ICD-10-CM | POA: Diagnosis not present

## 2022-08-10 DIAGNOSIS — C3490 Malignant neoplasm of unspecified part of unspecified bronchus or lung: Secondary | ICD-10-CM | POA: Diagnosis present

## 2022-08-10 DIAGNOSIS — F17210 Nicotine dependence, cigarettes, uncomplicated: Secondary | ICD-10-CM | POA: Diagnosis not present

## 2022-08-10 DIAGNOSIS — E1165 Type 2 diabetes mellitus with hyperglycemia: Secondary | ICD-10-CM | POA: Diagnosis present

## 2022-08-10 DIAGNOSIS — R0902 Hypoxemia: Secondary | ICD-10-CM | POA: Diagnosis not present

## 2022-08-10 DIAGNOSIS — D6959 Other secondary thrombocytopenia: Secondary | ICD-10-CM | POA: Diagnosis present

## 2022-08-10 DIAGNOSIS — R0603 Acute respiratory distress: Secondary | ICD-10-CM | POA: Diagnosis not present

## 2022-08-10 DIAGNOSIS — D631 Anemia in chronic kidney disease: Secondary | ICD-10-CM | POA: Diagnosis present

## 2022-08-10 DIAGNOSIS — F419 Anxiety disorder, unspecified: Secondary | ICD-10-CM | POA: Diagnosis present

## 2022-08-10 DIAGNOSIS — J439 Emphysema, unspecified: Secondary | ICD-10-CM | POA: Diagnosis not present

## 2022-08-10 DIAGNOSIS — N390 Urinary tract infection, site not specified: Secondary | ICD-10-CM | POA: Diagnosis not present

## 2022-08-10 DIAGNOSIS — J441 Chronic obstructive pulmonary disease with (acute) exacerbation: Secondary | ICD-10-CM | POA: Diagnosis present

## 2022-08-10 DIAGNOSIS — R062 Wheezing: Secondary | ICD-10-CM | POA: Diagnosis not present

## 2022-08-10 DIAGNOSIS — E1122 Type 2 diabetes mellitus with diabetic chronic kidney disease: Secondary | ICD-10-CM | POA: Diagnosis present

## 2022-08-10 DIAGNOSIS — D630 Anemia in neoplastic disease: Secondary | ICD-10-CM | POA: Diagnosis present

## 2022-08-10 DIAGNOSIS — I700 Atherosclerosis of aorta: Secondary | ICD-10-CM | POA: Diagnosis not present

## 2022-08-10 DIAGNOSIS — J4489 Other specified chronic obstructive pulmonary disease: Secondary | ICD-10-CM | POA: Diagnosis present

## 2022-08-10 DIAGNOSIS — Z66 Do not resuscitate: Secondary | ICD-10-CM | POA: Diagnosis not present

## 2022-08-10 DIAGNOSIS — D509 Iron deficiency anemia, unspecified: Secondary | ICD-10-CM | POA: Diagnosis present

## 2022-08-10 DIAGNOSIS — I5033 Acute on chronic diastolic (congestive) heart failure: Secondary | ICD-10-CM | POA: Diagnosis present

## 2022-08-10 DIAGNOSIS — K5903 Drug induced constipation: Secondary | ICD-10-CM | POA: Diagnosis not present

## 2022-08-10 DIAGNOSIS — A419 Sepsis, unspecified organism: Secondary | ICD-10-CM | POA: Diagnosis not present

## 2022-08-10 DIAGNOSIS — R069 Unspecified abnormalities of breathing: Secondary | ICD-10-CM | POA: Diagnosis not present

## 2022-08-10 DIAGNOSIS — R000 Tachycardia, unspecified: Secondary | ICD-10-CM | POA: Diagnosis not present

## 2022-08-10 DIAGNOSIS — I272 Pulmonary hypertension, unspecified: Secondary | ICD-10-CM | POA: Diagnosis present

## 2022-08-10 DIAGNOSIS — R6520 Severe sepsis without septic shock: Secondary | ICD-10-CM | POA: Diagnosis present

## 2022-08-10 DIAGNOSIS — E872 Acidosis, unspecified: Secondary | ICD-10-CM | POA: Diagnosis present

## 2022-08-10 LAB — RAD ONC ARIA SESSION SUMMARY
Course Elapsed Days: 8
Plan Fractions Treated to Date: 7
Plan Prescribed Dose Per Fraction: 5 Gy
Plan Total Fractions Prescribed: 10
Plan Total Prescribed Dose: 50 Gy
Reference Point Dosage Given to Date: 35 Gy
Reference Point Session Dosage Given: 5 Gy
Session Number: 7

## 2022-08-11 ENCOUNTER — Other Ambulatory Visit: Payer: Self-pay

## 2022-08-11 ENCOUNTER — Ambulatory Visit
Admission: RE | Admit: 2022-08-11 | Discharge: 2022-08-11 | Disposition: A | Discharge status: Home / Self Care | Payer: Medicare PPO | Source: Ambulatory Visit | Attending: Radiation Oncology | Admitting: Radiation Oncology

## 2022-08-11 DIAGNOSIS — F17210 Nicotine dependence, cigarettes, uncomplicated: Secondary | ICD-10-CM | POA: Diagnosis not present

## 2022-08-11 DIAGNOSIS — Z510 Encounter for antineoplastic radiation therapy: Secondary | ICD-10-CM | POA: Diagnosis not present

## 2022-08-11 DIAGNOSIS — C3412 Malignant neoplasm of upper lobe, left bronchus or lung: Secondary | ICD-10-CM | POA: Diagnosis not present

## 2022-08-11 LAB — RAD ONC ARIA SESSION SUMMARY
Course Elapsed Days: 9
Plan Fractions Treated to Date: 8
Plan Prescribed Dose Per Fraction: 5 Gy
Plan Total Fractions Prescribed: 10
Plan Total Prescribed Dose: 50 Gy
Reference Point Dosage Given to Date: 40 Gy
Reference Point Session Dosage Given: 5 Gy
Session Number: 8

## 2022-08-12 ENCOUNTER — Other Ambulatory Visit: Payer: Self-pay

## 2022-08-12 ENCOUNTER — Inpatient Hospital Stay (HOSPITAL_COMMUNITY)
Admission: EM | Admit: 2022-08-12 | Discharge: 2022-08-15 | DRG: 871 | Disposition: A | Discharge status: Home / Self Care | Payer: Medicare PPO | Attending: Internal Medicine | Admitting: Internal Medicine

## 2022-08-12 ENCOUNTER — Emergency Department (HOSPITAL_COMMUNITY): Payer: Medicare PPO

## 2022-08-12 ENCOUNTER — Ambulatory Visit
Admission: RE | Admit: 2022-08-12 | Discharge: 2022-08-12 | Disposition: A | Discharge status: Home / Self Care | Payer: Medicare PPO | Source: Ambulatory Visit | Attending: Radiation Oncology | Admitting: Radiation Oncology

## 2022-08-12 ENCOUNTER — Encounter (HOSPITAL_COMMUNITY): Payer: Self-pay | Admitting: Emergency Medicine

## 2022-08-12 DIAGNOSIS — E1122 Type 2 diabetes mellitus with diabetic chronic kidney disease: Secondary | ICD-10-CM | POA: Diagnosis present

## 2022-08-12 DIAGNOSIS — E872 Acidosis, unspecified: Secondary | ICD-10-CM | POA: Diagnosis present

## 2022-08-12 DIAGNOSIS — Z66 Do not resuscitate: Secondary | ICD-10-CM | POA: Diagnosis not present

## 2022-08-12 DIAGNOSIS — K5909 Other constipation: Secondary | ICD-10-CM | POA: Diagnosis present

## 2022-08-12 DIAGNOSIS — I2510 Atherosclerotic heart disease of native coronary artery without angina pectoris: Secondary | ICD-10-CM | POA: Diagnosis present

## 2022-08-12 DIAGNOSIS — A419 Sepsis, unspecified organism: Secondary | ICD-10-CM | POA: Diagnosis present

## 2022-08-12 DIAGNOSIS — K219 Gastro-esophageal reflux disease without esophagitis: Secondary | ICD-10-CM | POA: Diagnosis present

## 2022-08-12 DIAGNOSIS — R140 Abdominal distension (gaseous): Secondary | ICD-10-CM | POA: Diagnosis present

## 2022-08-12 DIAGNOSIS — G4733 Obstructive sleep apnea (adult) (pediatric): Secondary | ICD-10-CM | POA: Diagnosis present

## 2022-08-12 DIAGNOSIS — Z87891 Personal history of nicotine dependence: Secondary | ICD-10-CM

## 2022-08-12 DIAGNOSIS — Z7984 Long term (current) use of oral hypoglycemic drugs: Secondary | ICD-10-CM

## 2022-08-12 DIAGNOSIS — Z79899 Other long term (current) drug therapy: Secondary | ICD-10-CM

## 2022-08-12 DIAGNOSIS — F17210 Nicotine dependence, cigarettes, uncomplicated: Secondary | ICD-10-CM | POA: Diagnosis not present

## 2022-08-12 DIAGNOSIS — D6959 Other secondary thrombocytopenia: Secondary | ICD-10-CM | POA: Diagnosis present

## 2022-08-12 DIAGNOSIS — E669 Obesity, unspecified: Secondary | ICD-10-CM | POA: Diagnosis present

## 2022-08-12 DIAGNOSIS — Z955 Presence of coronary angioplasty implant and graft: Secondary | ICD-10-CM

## 2022-08-12 DIAGNOSIS — I130 Hypertensive heart and chronic kidney disease with heart failure and stage 1 through stage 4 chronic kidney disease, or unspecified chronic kidney disease: Secondary | ICD-10-CM | POA: Diagnosis present

## 2022-08-12 DIAGNOSIS — I5033 Acute on chronic diastolic (congestive) heart failure: Secondary | ICD-10-CM | POA: Diagnosis present

## 2022-08-12 DIAGNOSIS — D630 Anemia in neoplastic disease: Secondary | ICD-10-CM | POA: Diagnosis present

## 2022-08-12 DIAGNOSIS — A4151 Sepsis due to Escherichia coli [E. coli]: Principal | ICD-10-CM | POA: Diagnosis present

## 2022-08-12 DIAGNOSIS — J9622 Acute and chronic respiratory failure with hypercapnia: Secondary | ICD-10-CM | POA: Diagnosis present

## 2022-08-12 DIAGNOSIS — I272 Pulmonary hypertension, unspecified: Secondary | ICD-10-CM | POA: Diagnosis present

## 2022-08-12 DIAGNOSIS — Z1152 Encounter for screening for COVID-19: Secondary | ICD-10-CM

## 2022-08-12 DIAGNOSIS — J4489 Other specified chronic obstructive pulmonary disease: Secondary | ICD-10-CM | POA: Diagnosis present

## 2022-08-12 DIAGNOSIS — Z7982 Long term (current) use of aspirin: Secondary | ICD-10-CM

## 2022-08-12 DIAGNOSIS — Z510 Encounter for antineoplastic radiation therapy: Secondary | ICD-10-CM | POA: Diagnosis not present

## 2022-08-12 DIAGNOSIS — T380X5A Adverse effect of glucocorticoids and synthetic analogues, initial encounter: Secondary | ICD-10-CM | POA: Diagnosis not present

## 2022-08-12 DIAGNOSIS — N12 Tubulo-interstitial nephritis, not specified as acute or chronic: Secondary | ICD-10-CM | POA: Diagnosis present

## 2022-08-12 DIAGNOSIS — F32A Depression, unspecified: Secondary | ICD-10-CM | POA: Diagnosis present

## 2022-08-12 DIAGNOSIS — N1832 Chronic kidney disease, stage 3b: Secondary | ICD-10-CM | POA: Diagnosis present

## 2022-08-12 DIAGNOSIS — J9621 Acute and chronic respiratory failure with hypoxia: Secondary | ICD-10-CM

## 2022-08-12 DIAGNOSIS — Z9049 Acquired absence of other specified parts of digestive tract: Secondary | ICD-10-CM

## 2022-08-12 DIAGNOSIS — D509 Iron deficiency anemia, unspecified: Secondary | ICD-10-CM | POA: Diagnosis present

## 2022-08-12 DIAGNOSIS — Z6829 Body mass index (BMI) 29.0-29.9, adult: Secondary | ICD-10-CM

## 2022-08-12 DIAGNOSIS — J441 Chronic obstructive pulmonary disease with (acute) exacerbation: Secondary | ICD-10-CM

## 2022-08-12 DIAGNOSIS — M1990 Unspecified osteoarthritis, unspecified site: Secondary | ICD-10-CM | POA: Diagnosis present

## 2022-08-12 DIAGNOSIS — F419 Anxiety disorder, unspecified: Secondary | ICD-10-CM | POA: Diagnosis present

## 2022-08-12 DIAGNOSIS — Z9981 Dependence on supplemental oxygen: Secondary | ICD-10-CM

## 2022-08-12 DIAGNOSIS — R6520 Severe sepsis without septic shock: Secondary | ICD-10-CM | POA: Diagnosis present

## 2022-08-12 DIAGNOSIS — C3490 Malignant neoplasm of unspecified part of unspecified bronchus or lung: Secondary | ICD-10-CM | POA: Diagnosis present

## 2022-08-12 DIAGNOSIS — N179 Acute kidney failure, unspecified: Secondary | ICD-10-CM | POA: Diagnosis present

## 2022-08-12 DIAGNOSIS — Z85118 Personal history of other malignant neoplasm of bronchus and lung: Secondary | ICD-10-CM

## 2022-08-12 DIAGNOSIS — D631 Anemia in chronic kidney disease: Secondary | ICD-10-CM | POA: Diagnosis present

## 2022-08-12 DIAGNOSIS — Z8249 Family history of ischemic heart disease and other diseases of the circulatory system: Secondary | ICD-10-CM

## 2022-08-12 DIAGNOSIS — C3412 Malignant neoplasm of upper lobe, left bronchus or lung: Secondary | ICD-10-CM | POA: Diagnosis not present

## 2022-08-12 DIAGNOSIS — E1165 Type 2 diabetes mellitus with hyperglycemia: Secondary | ICD-10-CM | POA: Diagnosis present

## 2022-08-12 DIAGNOSIS — Z923 Personal history of irradiation: Secondary | ICD-10-CM

## 2022-08-12 LAB — COMPREHENSIVE METABOLIC PANEL
ALT: 23 U/L (ref 0–44)
AST: 22 U/L (ref 15–41)
Albumin: 4.1 g/dL (ref 3.5–5.0)
Alkaline Phosphatase: 47 U/L (ref 38–126)
Anion gap: 14 (ref 5–15)
BUN: 25 mg/dL — ABNORMAL HIGH (ref 8–23)
CO2: 22 mmol/L (ref 22–32)
Calcium: 9.3 mg/dL (ref 8.9–10.3)
Chloride: 103 mmol/L (ref 98–111)
Creatinine, Ser: 1.24 mg/dL — ABNORMAL HIGH (ref 0.44–1.00)
GFR, Estimated: 45 mL/min — ABNORMAL LOW (ref >60)
Glucose, Bld: 233 mg/dL — ABNORMAL HIGH (ref 70–99)
Potassium: 3.7 mmol/L (ref 3.5–5.1)
Sodium: 139 mmol/L (ref 135–145)
Total Bilirubin: 1.1 mg/dL (ref 0.3–1.2)
Total Protein: 7.6 g/dL (ref 6.5–8.1)

## 2022-08-12 LAB — APTT: aPTT: 26 seconds (ref 24–36)

## 2022-08-12 LAB — CBC WITH DIFFERENTIAL/PLATELET
Abs Immature Granulocytes: 0.04 10*3/uL (ref 0.00–0.07)
Basophils Absolute: 0 10*3/uL (ref 0.0–0.1)
Basophils Relative: 0 %
Eosinophils Absolute: 0 10*3/uL (ref 0.0–0.5)
Eosinophils Relative: 0 %
HCT: 37.7 % (ref 36.0–46.0)
Hemoglobin: 11.3 g/dL — ABNORMAL LOW (ref 12.0–15.0)
Immature Granulocytes: 0 %
Lymphocytes Relative: 14 %
Lymphs Abs: 1.3 10*3/uL (ref 0.7–4.0)
MCH: 31.2 pg (ref 26.0–34.0)
MCHC: 30 g/dL (ref 30.0–36.0)
MCV: 104.1 fL — ABNORMAL HIGH (ref 80.0–100.0)
Monocytes Absolute: 0.9 10*3/uL (ref 0.1–1.0)
Monocytes Relative: 9 %
Neutro Abs: 7 10*3/uL (ref 1.7–7.7)
Neutrophils Relative %: 77 %
Platelets: 121 10*3/uL — ABNORMAL LOW (ref 150–400)
RBC: 3.62 MIL/uL — ABNORMAL LOW (ref 3.87–5.11)
RDW: 11.9 % (ref 11.5–15.5)
WBC: 9.3 10*3/uL (ref 4.0–10.5)
nRBC: 0 % (ref 0.0–0.2)

## 2022-08-12 LAB — LACTIC ACID, PLASMA: Lactic Acid, Venous: 1.9 mmol/L (ref 0.5–1.9)

## 2022-08-12 LAB — RAD ONC ARIA SESSION SUMMARY
Course Elapsed Days: 10
Plan Fractions Treated to Date: 9
Plan Prescribed Dose Per Fraction: 5 Gy
Plan Total Fractions Prescribed: 10
Plan Total Prescribed Dose: 50 Gy
Reference Point Dosage Given to Date: 45 Gy
Reference Point Session Dosage Given: 5 Gy
Session Number: 9

## 2022-08-12 LAB — BLOOD GAS, VENOUS
Acid-Base Excess: 0.6 mmol/L (ref 0.0–2.0)
Bicarbonate: 26 mmol/L (ref 20.0–28.0)
O2 Saturation: 80.7 %
Patient temperature: 37
pCO2, Ven: 44 mmHg (ref 44–60)
pH, Ven: 7.38 (ref 7.25–7.43)
pO2, Ven: 46 mmHg — ABNORMAL HIGH (ref 32–45)

## 2022-08-12 LAB — PROTIME-INR
INR: 1.2 (ref 0.8–1.2)
Prothrombin Time: 15.3 seconds — ABNORMAL HIGH (ref 11.4–15.2)

## 2022-08-12 MED ORDER — VANCOMYCIN HCL 1500 MG/300ML IV SOLN
1500.0000 mg | Freq: Once | INTRAVENOUS | Status: AC
  Administered 2022-08-12: 1500 mg via INTRAVENOUS
  Filled 2022-08-12: qty 300

## 2022-08-12 MED ORDER — ALBUTEROL SULFATE (2.5 MG/3ML) 0.083% IN NEBU
10.0000 mg/h | INHALATION_SOLUTION | RESPIRATORY_TRACT | Status: AC
  Administered 2022-08-12: 10 mg/h via RESPIRATORY_TRACT
  Filled 2022-08-12: qty 3

## 2022-08-12 MED ORDER — SODIUM CHLORIDE 0.9 % IV BOLUS
1000.0000 mL | Freq: Once | INTRAVENOUS | Status: AC
  Administered 2022-08-12: 1000 mL via INTRAVENOUS

## 2022-08-12 MED ORDER — PIPERACILLIN-TAZOBACTAM 3.375 G IVPB 30 MIN
3.3750 g | Freq: Once | INTRAVENOUS | Status: AC
  Administered 2022-08-12: 3.375 g via INTRAVENOUS
  Filled 2022-08-12: qty 50

## 2022-08-12 MED ORDER — METHYLPREDNISOLONE SODIUM SUCC 125 MG IJ SOLR
125.0000 mg | Freq: Once | INTRAMUSCULAR | Status: AC
  Administered 2022-08-12: 125 mg via INTRAVENOUS
  Filled 2022-08-12: qty 2

## 2022-08-12 MED ORDER — ONDANSETRON HCL 4 MG/2ML IJ SOLN
4.0000 mg | Freq: Once | INTRAMUSCULAR | Status: AC
  Administered 2022-08-12: 4 mg via INTRAVENOUS
  Filled 2022-08-12: qty 2

## 2022-08-12 NOTE — ED Provider Notes (Signed)
[place] EMERGENCY DEPARTMENT AT [facility] Provider Note   CSN: [id number] Arrival date & time: 08/12/22  2206     History  Chief Complaint  Patient presents with   Shortness of Breath    Suzanne Dickson is a 77 y.o. female w/ hx of CAD s/p PCI, COPD in 6-8 L [place] at baseline, HTN, non small cell lung cancer.  Patient arrives from home in respiratory distress, given duoneb x 2 by EMS and oral tylenol for fever.  Patient reporting SOB, nausea, abdominal pain.  Hospitalized April-May 2023 for sepsis requiring ICU admission, on bipap,  and pyelonephritis with blood cx positive for proteus.  HPI     Home Medications Prior to Admission medications   Medication Sig Start Date End Date Taking? Authorizing Provider  acetaminophen (TYLENOL) 500 MG tablet Take 1 tablet (500 mg total) by mouth every 6 (six) hours as needed. 03/22/21   Derwood Kaplan, MD  albuterol (PROVENTIL) (2.5 MG/3ML) 0.083% nebulizer solution USE 1 VIAL IN NEBULIZER EVERY 4 HOURS - And As Needed For Shortness of Breath 07/28/22   Nyoka Cowden, MD  albuterol (VENTOLIN HFA) 108 (90 Base) MCG/ACT inhaler INHALE 2 PUFFS BY MOUTH EVERY 4 HOURS AS NEEDED FOR WHEEZING FOR SHORTNESS OF BREATH 05/21/22   Nyoka Cowden, MD  amLODipine (NORVASC) 5 MG tablet Take 5 mg by mouth daily.    [provider]  aspirin EC 81 MG tablet Take 81 mg by mouth daily. Swallow whole.    [provider]  atorvastatin (LIPITOR) 20 MG tablet Take 20 mg by mouth at bedtime. 08/21/20   [provider]  Azelastine-Fluticasone 137-50 MCG/ACT SUSP 2 PUFFS INTO EACH NOSTRIL AT BEDTIME 04/22/20   Nyoka Cowden, MD  cetirizine (ZYRTEC) 10 MG tablet Take 10 mg by mouth at bedtime.    [provider]  cholecalciferol (VITAMIN D3) 25 MCG (1000 UNIT) tablet Take 1,000 Units by mouth daily.    [provider]  clopidogrel (PLAVIX) 75 MG tablet Take 75 mg by mouth every morning.     [provider]  Cyanocobalamin (B-12 PO) Take 1 tablet by mouth daily.    [provider]  empagliflozin (JARDIANCE) 10 MG TABS tablet Take 10 mg by mouth daily. 05/20/22 05/20/23  [provider]  ferrous sulfate 325 (65 FE) MG EC tablet Take 325 mg by mouth 3 (three) times daily with meals.     [provider]  Fluticasone-Umeclidin-Vilant (TRELEGY ELLIPTA) 100-62.5-25 MCG/ACT AEPB Inhale 1 puff by mouth once daily 08/05/22   Nyoka Cowden, MD  losartan (COZAAR) 50 MG tablet Take 50 mg by mouth every morning. 11/15/18   [provider]  methocarbamol (ROBAXIN) 500 MG tablet Take by mouth. 05/14/21   [provider]  metoprolol succinate (TOPROL-XL) 25 MG 24 hr tablet Take 25 mg by mouth daily.    [provider]  nitroGLYCERIN (NITRODUR - DOSED IN MG/24 HR) 0.4 mg/hr patch Place 0.4 mg onto the skin every morning. 08/10/17   [provider]  nitroGLYCERIN (NITROSTAT) 0.4 MG SL tablet Place 0.4 mg under the tongue every 5 (five) minutes as needed for chest pain.    [provider]  OVER THE COUNTER MEDICATION Take 1 tablet by mouth at bedtime. OTC sleep aid - unknown ingredients    [provider]  OVER THE COUNTER MEDICATION Take 3,000 mcg by mouth daily. Vitamin B12 tablet daily    [provider]  OXYGEN Inhale 4-6 L into the lungs continuous.    [provider]  pantoprazole (PROTONIX) 40 MG tablet Take 30-60 min before first meal of the day Patient taking differently: Take 40 mg by mouth daily before breakfast. 08/08/18   Nyoka Cowden, MD  Polyvinyl Alcohol-Povidone (REFRESH OP) Place 1 drop into both eyes daily as needed (dry eyes).    [provider]  predniSONE (DELTASONE) 10 MG tablet Start on 5/10 06/04/22   Elgergawy, Leana Roe, MD      Allergies    Patient has no known allergies.    Review of Systems   Review of Systems  Physical Exam Updated Vital Signs BP (!) 161/73 (BP  Location: Right Arm)   Pulse (!) 153   Temp 99.3 F (37.4 C) (Oral)   Resp (!) 26   SpO2 96%  Physical Exam Constitutional:      General: She is not in acute distress. HENT:     Head: Normocephalic and atraumatic.  Eyes:     Conjunctiva/sclera: Conjunctivae normal.     Pupils: Pupils are equal, round, and reactive to light.  Cardiovascular:     Rate and Rhythm: Regular rhythm. Tachycardia present.  Pulmonary:     Effort: Pulmonary effort is normal. No respiratory distress.     Comments: Wheezing bilateral lung fields Abdominal:     General: There is distension.     Tenderness: There is no abdominal tenderness.  Skin:    General: Skin is warm and dry.  Neurological:     General: No focal deficit present.     Mental Status: She is alert. Mental status is at baseline.  Psychiatric:        Mood and Affect: Mood normal.        Behavior: Behavior normal.    ED Results / Procedures / Treatments   Labs (all labs ordered are listed, but only abnormal results are displayed) Labs Reviewed  CULTURE, BLOOD (ROUTINE X 2)  CULTURE, BLOOD (ROUTINE X 2)  COMPREHENSIVE METABOLIC PANEL  CBC WITH DIFFERENTIAL/PLATELET  PROTIME-INR  APTT  I-STAT CG4 LACTIC ACID, ED  I-STAT VENOUS BLOOD GAS, ED    EKG None  Radiology No results found.  Procedures .Critical Care  Performed by: Terald Sleeper, MD Authorized by: Terald Sleeper, MD   Critical care provider statement:    Critical care time (minutes):  45   Critical care time was exclusive of:  Separately billable procedures and treating other patients   Critical care was necessary to treat or prevent imminent or life-threatening deterioration of the following conditions:  Sepsis   Critical care was time spent personally by me on the following activities:  Ordering and performing treatments and interventions, ordering and review of laboratory studies, ordering and review of radiographic studies, pulse oximetry, review of old  charts, examination of patient and evaluation of patient's response to treatment     Medications Ordered in ED Medications  ondansetron (ZOFRAN) injection 4 mg (has no administration in time range)  methylPREDNISolone sodium succinate (SOLU-MEDROL) 125 mg/2 mL injection 125 mg (has no administration in time range)  albuterol (PROVENTIL,VENTOLIN) solution continuous neb (has no administration in time range)    ED Course/ Medical Decision Making/ A&P Clinical Course as of 08/13/22 0017  Thu Aug 12, 2022  2321 Lactic Acid, Venous: 1.9 [MT]  Fri Aug 13, 2022  0016 Nitrite(!): POSITIVE [MT]  0016 Glori Luis): SMALL [MT]  0017 Pt much more comfortable on bipap, says breathing is  better. Husband is now present.  Plan to admit for urosepsis, possible PNA, and COPD exacerbation [MT]    Clinical Course User Index [MT] Jahi Roza, Kermit Balo, MD                             Medical Decision Making Amount and/or Complexity of Data Reviewed Labs: ordered. Decision-making details documented in ED Course. Radiology: ordered. ECG/medicine tests: ordered.  Risk Prescription drug management. Decision regarding hospitalization.   This patient presents to the ED with concern for shortness of breath, nausea. This involves an extensive number of treatment options, and is a complaint that carries with it a high risk of complications and morbidity.  The differential diagnosis includes COPD exacerbation versus pneumonia versus abdominal process versus urinary infection versus sepsis versus other  Co-morbidities that complicate the patient evaluation: History of COPD at high risk of exacerbation  Additional history obtained from EMS, husband  External records from outside source obtained and reviewed including hospital discharge summary from 2 months ago  I ordered and personally interpreted labs.  The pertinent results include:  lactate 1.9, WBC 9.3, VBG no acidosis, UA with +nitrites and  bacteria  I ordered imaging studies including dg chest, CT chest/abd/pelvis I independently visualized and interpreted imaging which showed questionable lower lobe opacity noted - CT imaging pending at signout I agree with the radiologist interpretation  The patient was maintained on a cardiac monitor.  I personally viewed and interpreted the cardiac monitored which showed an underlying rhythm of: sinus tachycardia  Per my interpretation the patient's ECG shows sinus tachycardia  I ordered medication including IV steroids, duonebs for COPD exacerbation, BS antibiotics for sepsis (Pulm vs Urinary source suspected); initiation of fluid bolus in ED  I have reviewed the patients home medicines and have made adjustments as needed  Test Considered: lower suspicion for acute PE clinically  After the interventions noted above, I reevaluated the patient and found that they have: improved - stabilized respiratory effort on bipap, improved, no hypoxia, HR improving on bipap   Dispostion:  After consideration of the diagnostic results and the patients response to treatment, I feel that the patent would benefit from medical admission.  However the patient is signed out to Dr Drema Pry EDP at 0100 pending callback from hospitalist and follow up on CT imaging to evaluate for potential sepsis etiology.           Final Clinical Impression(s) / ED Diagnoses Final diagnoses:  None    Rx / DC Orders ED Discharge Orders     None         Aireal Slater, Kermit Balo, MD 08/13/22 [id number]

## 2022-08-12 NOTE — Progress Notes (Signed)
A consult was received from an ED physician for Vancomycin and Zosyn per pharmacy dosing.  The patient's profile has been reviewed for ht/wt/allergies/indication/available labs.    Weight = 71.8 kg (07/09/22)  A one time order has been placed for Vancomycin 1500mg  IV and Zosyn 3.375gm IV.    Further antibiotics/pharmacy consults should be ordered by admitting physician if indicated.                       Thank you, Maryellen Pile, PharmD 08/12/2022  10:46 PM

## 2022-08-12 NOTE — ED Triage Notes (Signed)
Patient arrived from home with complaints of shortness of breath and generalized weakness. Wears 5L at baseline oxygen saturation at 84% on EMS arrival. Albuterol, atrovent, and acetaminophen given prior to arrival.

## 2022-08-13 ENCOUNTER — Inpatient Hospital Stay (HOSPITAL_COMMUNITY): Payer: Medicare PPO

## 2022-08-13 ENCOUNTER — Emergency Department (HOSPITAL_COMMUNITY): Payer: Medicare PPO

## 2022-08-13 ENCOUNTER — Ambulatory Visit: Payer: Medicare PPO

## 2022-08-13 ENCOUNTER — Other Ambulatory Visit: Payer: Self-pay

## 2022-08-13 DIAGNOSIS — J4489 Other specified chronic obstructive pulmonary disease: Secondary | ICD-10-CM | POA: Diagnosis present

## 2022-08-13 DIAGNOSIS — E669 Obesity, unspecified: Secondary | ICD-10-CM | POA: Diagnosis present

## 2022-08-13 DIAGNOSIS — F419 Anxiety disorder, unspecified: Secondary | ICD-10-CM | POA: Diagnosis present

## 2022-08-13 DIAGNOSIS — J9621 Acute and chronic respiratory failure with hypoxia: Secondary | ICD-10-CM | POA: Diagnosis present

## 2022-08-13 DIAGNOSIS — N12 Tubulo-interstitial nephritis, not specified as acute or chronic: Secondary | ICD-10-CM | POA: Diagnosis present

## 2022-08-13 DIAGNOSIS — Z66 Do not resuscitate: Secondary | ICD-10-CM | POA: Diagnosis not present

## 2022-08-13 DIAGNOSIS — R0603 Acute respiratory distress: Secondary | ICD-10-CM

## 2022-08-13 DIAGNOSIS — J9622 Acute and chronic respiratory failure with hypercapnia: Secondary | ICD-10-CM

## 2022-08-13 DIAGNOSIS — E872 Acidosis, unspecified: Secondary | ICD-10-CM

## 2022-08-13 DIAGNOSIS — D631 Anemia in chronic kidney disease: Secondary | ICD-10-CM | POA: Diagnosis present

## 2022-08-13 DIAGNOSIS — A419 Sepsis, unspecified organism: Secondary | ICD-10-CM

## 2022-08-13 DIAGNOSIS — I130 Hypertensive heart and chronic kidney disease with heart failure and stage 1 through stage 4 chronic kidney disease, or unspecified chronic kidney disease: Secondary | ICD-10-CM | POA: Diagnosis present

## 2022-08-13 DIAGNOSIS — D630 Anemia in neoplastic disease: Secondary | ICD-10-CM | POA: Diagnosis present

## 2022-08-13 DIAGNOSIS — C3490 Malignant neoplasm of unspecified part of unspecified bronchus or lung: Secondary | ICD-10-CM | POA: Diagnosis present

## 2022-08-13 DIAGNOSIS — N390 Urinary tract infection, site not specified: Secondary | ICD-10-CM | POA: Diagnosis not present

## 2022-08-13 DIAGNOSIS — D509 Iron deficiency anemia, unspecified: Secondary | ICD-10-CM | POA: Diagnosis present

## 2022-08-13 DIAGNOSIS — Z1152 Encounter for screening for COVID-19: Secondary | ICD-10-CM | POA: Diagnosis not present

## 2022-08-13 DIAGNOSIS — I272 Pulmonary hypertension, unspecified: Secondary | ICD-10-CM | POA: Diagnosis present

## 2022-08-13 DIAGNOSIS — J432 Centrilobular emphysema: Secondary | ICD-10-CM | POA: Diagnosis not present

## 2022-08-13 DIAGNOSIS — I5033 Acute on chronic diastolic (congestive) heart failure: Secondary | ICD-10-CM | POA: Diagnosis present

## 2022-08-13 DIAGNOSIS — E1165 Type 2 diabetes mellitus with hyperglycemia: Secondary | ICD-10-CM | POA: Diagnosis present

## 2022-08-13 DIAGNOSIS — D6959 Other secondary thrombocytopenia: Secondary | ICD-10-CM | POA: Diagnosis present

## 2022-08-13 DIAGNOSIS — R6520 Severe sepsis without septic shock: Secondary | ICD-10-CM | POA: Diagnosis present

## 2022-08-13 DIAGNOSIS — J441 Chronic obstructive pulmonary disease with (acute) exacerbation: Secondary | ICD-10-CM | POA: Diagnosis present

## 2022-08-13 DIAGNOSIS — F32A Depression, unspecified: Secondary | ICD-10-CM | POA: Diagnosis present

## 2022-08-13 DIAGNOSIS — N1832 Chronic kidney disease, stage 3b: Secondary | ICD-10-CM | POA: Diagnosis present

## 2022-08-13 DIAGNOSIS — A4151 Sepsis due to Escherichia coli [E. coli]: Secondary | ICD-10-CM | POA: Diagnosis present

## 2022-08-13 DIAGNOSIS — E1122 Type 2 diabetes mellitus with diabetic chronic kidney disease: Secondary | ICD-10-CM | POA: Diagnosis present

## 2022-08-13 DIAGNOSIS — N179 Acute kidney failure, unspecified: Secondary | ICD-10-CM | POA: Diagnosis present

## 2022-08-13 DIAGNOSIS — K5903 Drug induced constipation: Secondary | ICD-10-CM | POA: Diagnosis not present

## 2022-08-13 LAB — BLOOD CULTURE ID PANEL (REFLEXED) - BCID2

## 2022-08-13 LAB — URINALYSIS, ROUTINE W REFLEX MICROSCOPIC
Bilirubin Urine: NEGATIVE
Glucose, UA: >=500 mg/dL — AB
Ketones, ur: 5 mg/dL — AB
Nitrite: POSITIVE — AB
Protein, ur: 30 mg/dL — AB
Specific Gravity, Urine: 1.021 (ref 1.005–1.030)
WBC, UA: >50 WBC/hpf (ref 0–5)
pH: 5 (ref 5.0–8.0)

## 2022-08-13 LAB — ECHOCARDIOGRAM COMPLETE
AR max vel: 2.4 cm2
AV Area VTI: 2.16 cm2
AV Area mean vel: 2.11 cm2
AV Mean grad: 4 mmHg
AV Peak grad: 6.9 mmHg
AV Vena cont: 0.3 cm
Ao pk vel: 1.32 m/s
Area-P 1/2: 6.27 cm2
Calc EF: 53.5 %
Height: 62 in
MV VTI: 3.62 cm2
P 1/2 time: 344 msec
S' Lateral: 3.2 cm
Single Plane A2C EF: 53 %
Single Plane A4C EF: 55.3 %
Weight: 2560.86 oz

## 2022-08-13 LAB — MRSA NEXT GEN BY PCR, NASAL: MRSA by PCR Next Gen: NOT DETECTED

## 2022-08-13 LAB — SARS CORONAVIRUS 2 BY RT PCR: SARS Coronavirus 2 by RT PCR: NEGATIVE

## 2022-08-13 LAB — HEMOGLOBIN A1C
Hgb A1c MFr Bld: 6.8 % — ABNORMAL HIGH (ref 4.8–5.6)
Mean Plasma Glucose: 148.46 mg/dL

## 2022-08-13 LAB — BRAIN NATRIURETIC PEPTIDE: B Natriuretic Peptide: 100.4 pg/mL — ABNORMAL HIGH (ref 0.0–100.0)

## 2022-08-13 LAB — COMPREHENSIVE METABOLIC PANEL
ALT: 26 U/L (ref 0–44)
AST: 22 U/L (ref 15–41)
Albumin: 3.7 g/dL (ref 3.5–5.0)
Alkaline Phosphatase: 40 U/L (ref 38–126)
Anion gap: 11 (ref 5–15)
BUN: 28 mg/dL — ABNORMAL HIGH (ref 8–23)
CO2: 21 mmol/L — ABNORMAL LOW (ref 22–32)
Calcium: 8.5 mg/dL — ABNORMAL LOW (ref 8.9–10.3)
Chloride: 110 mmol/L (ref 98–111)
Creatinine, Ser: 1.14 mg/dL — ABNORMAL HIGH (ref 0.44–1.00)
GFR, Estimated: 50 mL/min — ABNORMAL LOW (ref >60)
Glucose, Bld: 234 mg/dL — ABNORMAL HIGH (ref 70–99)
Potassium: 4.3 mmol/L (ref 3.5–5.1)
Sodium: 142 mmol/L (ref 135–145)
Total Bilirubin: 0.5 mg/dL (ref 0.3–1.2)
Total Protein: 7.3 g/dL (ref 6.5–8.1)

## 2022-08-13 LAB — GLUCOSE, CAPILLARY
Glucose-Capillary: 249 mg/dL — ABNORMAL HIGH (ref 70–99)
Glucose-Capillary: 195 mg/dL — ABNORMAL HIGH (ref 70–99)
Glucose-Capillary: 132 mg/dL — ABNORMAL HIGH (ref 70–99)

## 2022-08-13 LAB — LACTIC ACID, PLASMA
Lactic Acid, Venous: 2.3 mmol/L (ref 0.5–1.9)
Lactic Acid, Venous: 3.1 mmol/L (ref 0.5–1.9)
Lactic Acid, Venous: 3.9 mmol/L (ref 0.5–1.9)
Lactic Acid, Venous: 2.4 mmol/L (ref 0.5–1.9)
Lactic Acid, Venous: 1.4 mmol/L (ref 0.5–1.9)

## 2022-08-13 LAB — CULTURE, BLOOD (ROUTINE X 2)

## 2022-08-13 MED ORDER — BUDESONIDE 0.25 MG/2ML IN SUSP
0.2500 mg | Freq: Two times a day (BID) | RESPIRATORY_TRACT | Status: DC
  Administered 2022-08-13 – 2022-08-15 (×4): 0.25 mg via RESPIRATORY_TRACT
  Filled 2022-08-13 (×4): qty 2

## 2022-08-13 MED ORDER — METOPROLOL TARTRATE 5 MG/5ML IV SOLN
5.0000 mg | Freq: Four times a day (QID) | INTRAVENOUS | Status: DC | PRN
  Administered 2022-08-13 – 2022-08-14 (×2): 5 mg via INTRAVENOUS
  Filled 2022-08-13: qty 5

## 2022-08-13 MED ORDER — SODIUM CHLORIDE 0.9 % IV SOLN
1000.0000 mL | INTRAVENOUS | Status: DC
  Administered 2022-08-13: 1000 mL via INTRAVENOUS

## 2022-08-13 MED ORDER — SODIUM CHLORIDE 0.9 % IV SOLN: 1000.0000 mL | INTRAVENOUS | Status: DC

## 2022-08-13 MED ORDER — BISACODYL 10 MG RE SUPP
10.0000 mg | Freq: Once | RECTAL | Status: AC
  Administered 2022-08-13: 10 mg via RECTAL
  Filled 2022-08-13: qty 1

## 2022-08-13 MED ORDER — TRAZODONE HCL 50 MG PO TABS
25.0000 mg | ORAL_TABLET | Freq: Every evening | ORAL | Status: DC | PRN
  Administered 2022-08-14 (×2): 25 mg via ORAL
  Filled 2022-08-13 (×2): qty 1

## 2022-08-13 MED ORDER — METOPROLOL SUCCINATE ER 25 MG PO TB24
25.0000 mg | ORAL_TABLET | Freq: Every day | ORAL | Status: DC
  Administered 2022-08-13: 25 mg via ORAL

## 2022-08-13 MED ORDER — FLUTICASONE FUROATE-VILANTEROL 100-25 MCG/ACT IN AEPB
1.0000 | INHALATION_SPRAY | Freq: Every day | RESPIRATORY_TRACT | Status: DC
  Filled 2022-08-13: qty 28

## 2022-08-13 MED ORDER — INSULIN ASPART 100 UNIT/ML IJ SOLN
0.0000 [IU] | Freq: Every day | INTRAMUSCULAR | Status: DC
  Filled 2022-08-13: qty 0.05

## 2022-08-13 MED ORDER — BUSPIRONE HCL 5 MG PO TABS
7.5000 mg | ORAL_TABLET | Freq: Two times a day (BID) | ORAL | Status: DC
  Administered 2022-08-13 (×2): 7.5 mg via ORAL
  Filled 2022-08-13: qty 2

## 2022-08-13 MED ORDER — METHYLPREDNISOLONE SODIUM SUCC 40 MG IJ SOLR
40.0000 mg | Freq: Two times a day (BID) | INTRAMUSCULAR | Status: DC
  Administered 2022-08-13 – 2022-08-15 (×5): 40 mg via INTRAVENOUS
  Filled 2022-08-13 (×4): qty 1

## 2022-08-13 MED ORDER — ASPIRIN 81 MG PO TBEC
81.0000 mg | DELAYED_RELEASE_TABLET | Freq: Every day | ORAL | Status: DC
  Administered 2022-08-13 – 2022-08-15 (×3): 81 mg via ORAL
  Filled 2022-08-13 (×2): qty 1

## 2022-08-13 MED ORDER — ACETAMINOPHEN 325 MG PO TABS: 650.0000 mg | ORAL_TABLET | Freq: Four times a day (QID) | ORAL | Status: DC | PRN

## 2022-08-13 MED ORDER — LORATADINE 10 MG PO TABS
10.0000 mg | ORAL_TABLET | Freq: Every day | ORAL | Status: DC
  Administered 2022-08-13 – 2022-08-15 (×3): 10 mg via ORAL
  Filled 2022-08-13 (×2): qty 1

## 2022-08-13 MED ORDER — INSULIN ASPART 100 UNIT/ML IJ SOLN
0.0000 [IU] | Freq: Three times a day (TID) | INTRAMUSCULAR | Status: DC
  Administered 2022-08-13: 5 [IU] via SUBCUTANEOUS
  Administered 2022-08-13: 3 [IU] via SUBCUTANEOUS
  Administered 2022-08-14: 5 [IU] via SUBCUTANEOUS
  Administered 2022-08-14 (×2): 2 [IU] via SUBCUTANEOUS
  Administered 2022-08-15: 3 [IU] via SUBCUTANEOUS
  Administered 2022-08-15: 8 [IU] via SUBCUTANEOUS
  Filled 2022-08-13: qty 0.15

## 2022-08-13 MED ORDER — METOPROLOL SUCCINATE ER 25 MG PO TB24
50.0000 mg | ORAL_TABLET | Freq: Every day | ORAL | Status: DC
  Administered 2022-08-14 – 2022-08-15 (×2): 50 mg via ORAL
  Filled 2022-08-13 (×2): qty 2

## 2022-08-13 MED ORDER — ORAL CARE MOUTH RINSE
15.0000 mL | OROMUCOSAL | Status: DC
  Administered 2022-08-13 – 2022-08-15 (×6): 15 mL via OROMUCOSAL

## 2022-08-13 MED ORDER — SODIUM CHLORIDE 0.9 % IV SOLN
1000.0000 mL | INTRAVENOUS | Status: DC
  Administered 2022-08-13 – 2022-08-14 (×2): 1000 mL via INTRAVENOUS

## 2022-08-13 MED ORDER — SODIUM CHLORIDE 0.9 % IV SOLN
2.0000 g | INTRAVENOUS | Status: DC
  Administered 2022-08-13 – 2022-08-15 (×3): 2 g via INTRAVENOUS
  Filled 2022-08-13 (×2): qty 20

## 2022-08-13 MED ORDER — PANTOPRAZOLE SODIUM 40 MG PO TBEC
40.0000 mg | DELAYED_RELEASE_TABLET | Freq: Every day | ORAL | Status: DC
  Administered 2022-08-13 – 2022-08-15 (×3): 40 mg via ORAL
  Filled 2022-08-13 (×2): qty 1

## 2022-08-13 MED ORDER — ORAL CARE MOUTH RINSE: 15.0000 mL | OROMUCOSAL | Status: DC | PRN

## 2022-08-13 MED ORDER — REVEFENACIN 175 MCG/3ML IN SOLN
175.0000 ug | Freq: Every day | RESPIRATORY_TRACT | Status: DC
  Administered 2022-08-13 – 2022-08-15 (×3): 175 ug via RESPIRATORY_TRACT
  Filled 2022-08-13 (×2): qty 3

## 2022-08-13 MED ORDER — SODIUM CHLORIDE 0.9 % IV BOLUS
1000.0000 mL | Freq: Once | INTRAVENOUS | Status: AC
  Administered 2022-08-13: 1000 mL via INTRAVENOUS

## 2022-08-13 MED ORDER — CHLORHEXIDINE GLUCONATE CLOTH 2 % EX PADS
6.0000 | MEDICATED_PAD | Freq: Every day | CUTANEOUS | Status: DC
  Administered 2022-08-13 – 2022-08-14 (×2): 6 via TOPICAL

## 2022-08-13 MED ORDER — ARFORMOTEROL TARTRATE 15 MCG/2ML IN NEBU
15.0000 ug | INHALATION_SOLUTION | Freq: Two times a day (BID) | RESPIRATORY_TRACT | Status: DC
  Administered 2022-08-13 – 2022-08-15 (×4): 15 ug via RESPIRATORY_TRACT
  Filled 2022-08-13 (×4): qty 2

## 2022-08-13 MED ORDER — ENOXAPARIN SODIUM 40 MG/0.4ML IJ SOSY
40.0000 mg | PREFILLED_SYRINGE | INTRAMUSCULAR | Status: DC
  Administered 2022-08-13 – 2022-08-15 (×3): 40 mg via SUBCUTANEOUS
  Filled 2022-08-13 (×2): qty 0.4

## 2022-08-13 MED ORDER — PIPERACILLIN-TAZOBACTAM 3.375 G IVPB 30 MIN: 3.3750 g | Freq: Three times a day (TID) | INTRAVENOUS | Status: DC

## 2022-08-13 MED ORDER — ACETAMINOPHEN 650 MG RE SUPP: 650.0000 mg | Freq: Four times a day (QID) | RECTAL | Status: DC | PRN

## 2022-08-13 MED ORDER — AMLODIPINE BESYLATE 5 MG PO TABS
5.0000 mg | ORAL_TABLET | Freq: Every day | ORAL | Status: DC
  Administered 2022-08-13: 5 mg via ORAL

## 2022-08-13 MED ORDER — CLOPIDOGREL BISULFATE 75 MG PO TABS
75.0000 mg | ORAL_TABLET | Freq: Every morning | ORAL | Status: DC
  Administered 2022-08-13 – 2022-08-15 (×3): 75 mg via ORAL
  Filled 2022-08-13 (×2): qty 1

## 2022-08-13 MED ORDER — SODIUM CHLORIDE 0.9 % IV BOLUS (SEPSIS)
1000.0000 mL | Freq: Once | INTRAVENOUS | Status: AC
  Administered 2022-08-13: 1000 mL via INTRAVENOUS

## 2022-08-13 MED ORDER — UMECLIDINIUM BROMIDE 62.5 MCG/ACT IN AEPB
1.0000 | INHALATION_SPRAY | Freq: Every day | RESPIRATORY_TRACT | Status: DC
  Filled 2022-08-13: qty 7

## 2022-08-13 MED ORDER — ALBUTEROL SULFATE (2.5 MG/3ML) 0.083% IN NEBU
2.5000 mg | INHALATION_SOLUTION | RESPIRATORY_TRACT | Status: DC | PRN
  Administered 2022-08-13: 2.5 mg via RESPIRATORY_TRACT

## 2022-08-13 MED ORDER — FERROUS SULFATE 325 (65 FE) MG PO TABS
650.0000 mg | ORAL_TABLET | Freq: Every day | ORAL | Status: DC
  Administered 2022-08-13 – 2022-08-15 (×3): 650 mg via ORAL
  Filled 2022-08-13 (×2): qty 2

## 2022-08-13 NOTE — Progress Notes (Signed)
Pt transported to CT scan and back while on BiPAP.  Pt remained stable and comfortable throughout trip.

## 2022-08-13 NOTE — H&P (Addendum)
History and Physical  Suzanne Dickson VWU:[id number] DOB: 01/04/1946 DOA: 08/12/2022  PCP: Dot Been, FNP   Chief Complaint: shortness of breath   HPI: Suzanne Dickson is a 77 y.o. 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 She tells me she has been doing well but tired as she is getting radiation. But starting yesterday, she felt worse. She had some subjective fevers, some mild nausea, and started for feel very short of breath. She recalls having no diarrhea or vomiting or dysuria but in retrospect was urinating a lot more than usual.    ED Course: She called EMS who noted she was saturating 84% on 5L on their arrival. They gave her albuterol, atrovent, and tylenol and she was brought to ER where she was placed on Bipap due to work of breathing. She was given fluids, IV solumedrol, IV Vancomycin and Zosyn. Labs are as noted below. She feels much better this AM and is breathing comfortably on Bipap. She is requesting to have Bipap removed, which was done by RN in my presence.  Review of Systems: Please see HPI for pertinent positives and negatives. A complete 10 system review of systems are otherwise negative.  Past Medical History:  Diagnosis Date   Suzanne Dickson, 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   "I'm in remission" (04/05/2017)   Suzanne Dickson (HCC)    Anxiety    Arthritis    "legs, elbows" (04/05/2017)   Bronchitis, chronic (HCC)    "haven't had it in awhile" (04/05/2017)   CHF (congestive heart failure) (HCC)    COPD (chronic obstructive pulmonary disease) (HCC)    Coronary artery disease    Daily headache    "recently" (04/05/2017)   Depression    Dyspnea    with exertion   GERD (gastroesophageal reflux disease)    History of blood transfusion  1950s   History of radiation therapy 05/04/16 - 05/12/16   Left Dickson treated to 54 Gy with 3 fx of 18 Gy   Hypertension    On home oxygen therapy    "2L; 24/7" (04/05/2017)   OSA (obstructive sleep apnea)    OSA on CPAP    Pneumonia 10/2015   "at least 3 times" (04/05/2017)   Pulmonary hypertension (HCC)    Past Surgical History:  Procedure Laterality Date   COLONOSCOPY     CORONARY ANGIOPLASTY WITH STENT PLACEMENT  04/05/2017   CORONARY STENT INTERVENTION N/A 04/05/2017   Procedure: CORONARY STENT INTERVENTION;  Surgeon: Rinaldo Cloud, MD;  Location: [facility];  Service: Cardiovascular;  Laterality: N/A;   DILATION AND CURETTAGE OF UTERUS     FUDUCIAL PLACEMENT Left 03/31/2016   Procedure: PLACEMENT OF FUDUCIAL LEFT UPPER LOBE;  Surgeon: Leslye Peer, MD;  Location: [facility];  Service: Thoracic;  Laterality: Left;   LAPAROSCOPIC CHOLECYSTECTOMY     LEFT HEART CATH AND CORONARY ANGIOGRAPHY N/A 02/17/2017   Procedure: LEFT HEART CATH AND CORONARY ANGIOGRAPHY;  Surgeon: Rinaldo Cloud, MD;  Location: [facility];  Service: Cardiovascular;  Laterality: N/A;   SINUSOTOMY     TONSILLECTOMY     TUBAL LIGATION     VIDEO BRONCHOSCOPY WITH ENDOBRONCHIAL NAVIGATION N/A 03/31/2016   Procedure: VIDEO BRONCHOSCOPY WITH ENDOBRONCHIAL NAVIGATION;  Surgeon: Leslye Peer, MD;  Location: [facility];  Service: Thoracic;  Laterality: N/A;    Social History:  reports that she quit smoking about 7 years ago. Her smoking use included cigarettes. She started smoking about 47 years ago. She has a 40 pack-year smoking history. She has never used smokeless tobacco. She reports that she does not drink alcohol and does not use drugs.   No Known Allergies  Family History  Problem Relation Age of Onset   Heart attack Mother    Heart attack Father      Prior to Admission medications   Medication Sig Start Date End Date Taking? Authorizing Provider  acetaminophen (TYLENOL) 500 MG tablet Take 1 tablet  (500 mg total) by mouth every 6 (six) hours as needed. 03/22/21   Derwood Kaplan, MD  albuterol (PROVENTIL) (2.5 MG/3ML) 0.083% nebulizer solution USE 1 VIAL IN NEBULIZER EVERY 4 HOURS - And As Needed For Shortness of Breath 07/28/22   Nyoka Cowden, MD  albuterol (VENTOLIN HFA) 108 (90 Base) MCG/ACT inhaler INHALE 2 PUFFS BY MOUTH EVERY 4 HOURS AS NEEDED FOR WHEEZING FOR SHORTNESS OF BREATH 05/21/22   Nyoka Cowden, MD  amLODipine (NORVASC) 5 MG tablet Take 5 mg by mouth daily.    [provider]  aspirin EC 81 MG tablet Take 81 mg by mouth daily. Swallow whole.    [provider]  atorvastatin (LIPITOR) 20 MG tablet Take 20 mg by mouth at bedtime. 08/21/20   [provider]  Azelastine-Fluticasone 137-50 MCG/ACT SUSP 2 PUFFS INTO EACH NOSTRIL AT BEDTIME 04/22/20   Nyoka Cowden, MD  cetirizine (ZYRTEC) 10 MG tablet Take 10 mg by mouth at bedtime.    [provider]  cholecalciferol (VITAMIN D3) 25 MCG (1000 UNIT) tablet Take 1,000 Units by mouth daily.    [provider]  clopidogrel (PLAVIX) 75 MG tablet Take 75 mg by mouth every morning.     [provider]  Cyanocobalamin (B-12 PO) Take 1 tablet by mouth daily.    [provider]  empagliflozin (JARDIANCE) 10 MG TABS tablet Take 10 mg by mouth daily. 05/20/22 05/20/23  [provider]  ferrous sulfate 325 (65 FE) MG EC tablet Take 325 mg by mouth 3 (three) times daily with meals.     [provider]  Fluticasone-Umeclidin-Vilant (TRELEGY ELLIPTA) 100-62.5-25 MCG/ACT AEPB Inhale 1 puff by mouth once daily 08/05/22   Nyoka Cowden, MD  losartan (COZAAR) 50 MG tablet Take 50 mg by mouth every morning. 11/15/18   [provider]  methocarbamol (ROBAXIN) 500 MG tablet Take by mouth. 05/14/21   [provider]  metoprolol succinate (TOPROL-XL) 25 MG 24 hr tablet Take 25 mg by mouth daily.    [provider]  nitroGLYCERIN (NITRODUR - DOSED IN  MG/24 HR) 0.4 mg/hr patch Place 0.4 mg onto the skin every morning. 08/10/17   [provider]  nitroGLYCERIN (NITROSTAT) 0.4 MG SL tablet Place 0.4 mg under the tongue every 5 (five) minutes as needed for chest pain.    [provider]  OVER THE COUNTER MEDICATION Take 1 tablet by mouth at bedtime. OTC sleep aid - unknown ingredients    [provider]  OVER THE COUNTER MEDICATION Take 3,000 mcg by mouth daily. Vitamin B12 tablet daily    [provider]  OXYGEN Inhale 4-6 L into the lungs continuous.    [provider]  pantoprazole (PROTONIX) 40 MG tablet Take 30-60 min before first meal of the day Patient taking differently:  Take 40 mg by mouth daily before breakfast. 08/08/18   Nyoka Cowden, MD  Polyvinyl Alcohol-Povidone (REFRESH OP) Place 1 drop into both eyes daily as needed (dry eyes).    [provider]  predniSONE (DELTASONE) 10 MG tablet Start on 5/10 06/04/22   Elgergawy, Leana Roe, MD    Physical Exam: BP 120/79 (BP Location: Left Arm)   Pulse 100   Temp 97.6 F (36.4 C) (Oral)   Resp (!) 25   SpO2 96%   General:  Alert, oriented, calm, in no acute distress, pleasant and cooperative, able to speak with full sentences, Bipap removed. Eyes: EOMI, clear conjuctivae, white sclerea Neck: supple, no masses, trachea mildline  Cardiovascular: RRR, no murmurs or rubs, she has minimal pitting lower extremity bilateral edema  Respiratory: breath sounds distant but clear to auscultation bilaterally, no wheezes, no crackles  Abdomen: soft, nontender, slightly distended, normal bowel tones heard  Skin: dry, no rashes  Musculoskeletal: no joint effusions, normal range of motion  Psychiatric: appropriate affect, normal speech  Neurologic: extraocular muscles intact, clear speech, moving all extremities with intact sensorium          Labs on Admission:  Basic Metabolic Panel: Recent Labs  Lab 08/12/22 2242  NA 139  K 3.7   CL 103  CO2 22  GLUCOSE 233*  BUN 25*  CREATININE 1.24*  CALCIUM 9.3   Liver Function Tests: Recent Labs  Lab 08/12/22 2242  AST 22  ALT 23  ALKPHOS 47  BILITOT 1.1  PROT 7.6  ALBUMIN 4.1   No results for input(s): "LIPASE", "AMYLASE" in the last 168 hours. No results for input(s): "AMMONIA" in the last 168 hours. CBC: Recent Labs  Lab 08/12/22 2242  WBC 9.3  NEUTROABS 7.0  HGB 11.3*  HCT 37.7  MCV 104.1*  PLT 121*   Cardiac Enzymes: No results for input(s): "CKTOTAL", "CKMB", "CKMBINDEX", "TROPONINI" in the last 168 hours.  BNP (last 3 results) Recent Labs    05/24/22 1340  BNP 62.2    ProBNP (last 3 results) No results for input(s): "PROBNP" in the last 8760 hours.  CBG: No results for input(s): "GLUCAP" in the last 168 hours.  Radiological Exams on Admission: CT CHEST ABDOMEN PELVIS WO CONTRAST  Result Date: 08/13/2022 CLINICAL DATA:  Concern for pneumonia on chest x-ray. Abdominal distension and nausea. Sepsis. EXAM: CT CHEST, ABDOMEN AND PELVIS WITHOUT CONTRAST TECHNIQUE: Multidetector CT imaging of the chest, abdomen and pelvis was performed following the standard protocol without IV contrast. RADIATION DOSE REDUCTION: This exam was performed according to the departmental dose-optimization program which includes automated exposure control, adjustment of the mA and/or kV according to patient size and/or use of iterative reconstruction technique. COMPARISON:  Abdomen pelvis CT 05/24/2022.  PET-CT 08/13/2022 FINDINGS: CT CHEST FINDINGS Cardiovascular: The heart size is normal. No substantial pericardial effusion. Coronary artery calcification is evident. Moderate atherosclerotic calcification is noted in the wall of the thoracic aorta. Mediastinum/Nodes: No mediastinal lymphadenopathy. No evidence for gross hilar lymphadenopathy although assessment is limited by the lack of intravenous contrast on the current study. The esophagus has normal imaging features.  There is no axillary lymphadenopathy. Lungs/Pleura: Centrilobular emphsyema noted. Abnormal soft tissue in the left hilum compatible with the patient's known treated Dickson cancer, similar in appearance to PET-CT 08/13/2022. 15 mm nodule in the posterior left upper lobe (75/6) is similar to prior. There is some minimal dependent atelectasis in the Dickson bases. No substantial pleural effusion. Musculoskeletal: No worrisome lytic or  sclerotic osseous abnormality. Changes in the posterolateral left sixth rib are stable and may be treatment related. CT ABDOMEN PELVIS FINDINGS Hepatobiliary: No suspicious focal abnormality in the liver on this study without intravenous contrast. Gallbladder is surgically absent. No intrahepatic or extrahepatic biliary dilation. Pancreas: No focal mass lesion. No dilatation of the main duct. No intraparenchymal cyst. No peripancreatic edema. Spleen: No splenomegaly. No suspicious focal mass lesion. Adrenals/Urinary Tract: No adrenal nodule or mass. Similar appearance bilateral renal cysts better characterized on the previous abdomen/pelvis CT with contrast as Bosniak 1 and 2 lesions. Scattered vascular calcification noted in the hilum of each kidney, likely with superimposed tiny nonobstructing renal stones. The ill-defined area of hypoenhancement seen on the 05/24/2022 exam in the anterior left kidney is not readily evident on today's noncontrast study. No evidence for hydroureter. The urinary bladder appears normal for the degree of distention. Stomach/Bowel: Stomach is unremarkable. No gastric wall thickening. No evidence of outlet obstruction. Duodenum is normally positioned as is the ligament of Treitz. No small bowel wall thickening. No small bowel dilatation. The terminal ileum is normal. The appendix is normal. No gross colonic mass. No colonic wall thickening. Diverticular changes are noted in the left colon without evidence of diverticulitis. Vascular/Lymphatic: There is moderate  atherosclerotic calcification of the abdominal aorta without aneurysm. There is no gastrohepatic or hepatoduodenal ligament lymphadenopathy. No retroperitoneal or mesenteric lymphadenopathy. No pelvic sidewall lymphadenopathy. Reproductive: Unremarkable. Other: No intraperitoneal free fluid. Musculoskeletal: No worrisome lytic or sclerotic osseous abnormality. Superior endplate compression deformity noted at T6, T12, L1, L2, L3, and L4, similar to prior. IMPRESSION: 1. No acute findings in the chest, abdomen, or pelvis. Specifically, no findings to explain the patient's history of sepsis. 2. Abnormal soft tissue in the left Dickson hilum compatible with the patient's known treated Dickson cancer, similar in appearance to PET-CT 08/13/2022. 3. 15 mm nodule in the posterior left upper lobe is similar to prior. This finding was described as concerning for recurrence on the previous PET-CT. 4. The ill-defined area of hypoenhancement in the left kidney seen on the 05/24/2022 exam is not readily evident on today's noncontrast study. 5. Left colonic diverticulosis without diverticulitis. 6. Aortic Atherosclerosis (ICD10-I70.0) and Emphysema (ICD10-J43.9). Electronically Signed   By: Kennith Center M.D.   On: 08/13/2022 06:28   DG Chest Port 1 View  Result Date: 08/12/2022 CLINICAL DATA:  Questionable sepsis - evaluate for abnormality, left Dickson cancer EXAM: PORTABLE CHEST 1 VIEW COMPARISON:  Chest x-ray 06/09/2022, PET CT 05/20/2022 FINDINGS: The heart and mediastinal contours are unchanged. Aortic calcification. Persistently prominent bilateral hilar regions, left greater than right. Left hilar surgical changes. Question development of retrocardiac airspace opacity. No pulmonary edema. No pleural effusion. No pneumothorax. No acute osseous abnormality. IMPRESSION: 1. Question development of retrocardiac airspace opacity. 2. Persistently prominent bilateral hilar regions, left greater than right. 3. Aortic Atherosclerosis  (ICD10-I70.0). Electronically Signed   By: Tish Frederickson M.D.   On: 08/12/2022 22:59    Assessment/Plan Principal Problem:   Sepsis secondary to UTI [facility]) - meeting criteria with tachypnea, tachycardia and end organ dysfunction with Lactate 2.3. Source is UTI. No evidence of PNA or pyelonephritis.  - inpatient admission to [facility] - follow blood cultures, obtain urine Cx - note negative COVID swab - will treat with Rocephin IV, discussed with pharmacy staff - continue IVF and recheck lactate now (last was rising at 2.3)  Acute on chronic hypoxic respiratory failure - likely due to sepsis - continue IV Solumedrol 40 mg  BID - BREO and Incruse daily - Claritin daily - albuterol neb PRN - she has been weaned to her baseline oxygen as her sepsis is resolving  GERD - protonix daily  CKD3 - appears to be at baseline  Chronic Anemia - related to CKD3 and malignancy, stable  HTN - resume home meds once reconciled though BP currently normal, for now will treat with IV Metoprolol PRN  DM2 - check A1c, carb diet and SSI while in house  NSCLC - Dr. Arbutus Ped added to treatment team, no acute oncological issues  DVT prophylaxis: Lovenox     Code Status: DNR - patient confirms at bedside this AM  Consults called: None  Admission status: Inpatient   Time spent: 59 minutes  Tilmon Wisehart Sharlette Dense MD [facility] Pager [phone]  If 7PM-7AM, please contact night-coverage [email or web address] Password [email or web address]  08/13/2022, 8:11 AM

## 2022-08-13 NOTE — Progress Notes (Signed)
Found PT of BiPAP at 0827 (PT states she has been off for about 30 minutes). Order has been changed to PRN and HS (OSA). PT does not appear to be in respiratory distress at this time. PT confirms she is breathing "good."  Vitals: BBS diminished- clear            HR 112            RR 20            Sp02 97% on 4 LPM (PT sates she uses 4--6 lpm at             home)- MD ordered goal >=92% (PT COPD)

## 2022-08-13 NOTE — ED Notes (Signed)
Provided pt with moist mouth swab per pt request, pt expressed relief.

## 2022-08-13 NOTE — Progress Notes (Signed)
RN Victorino Dike spoke with Janine Ores to discuss pt overall condition in relation to radiation therapy today. Rn Hospital doctor stated pt was not doing well with her breathing and would most likely have to be placed back on bipap soon due to breathing concerns. Due to pt stability issues radiation treatment will be held today. We will reevaluate the pt on Monday for stability for radiation treatment.

## 2022-08-13 NOTE — ED Notes (Signed)
Patient taken off BiPAP per MD request. Placed on 6L [place] as this is her baseline amount.

## 2022-08-13 NOTE — Plan of Care (Signed)
Paged and messaged by RN that patient is tachypneic after last fluid bolus was started. She had been on [place] all day, and tachycardic and hypertensive. Her home Toprol XL was resumed early this PM as I felt these were partially due to missing her home dose of this medication. She has been hemodynamically stable. Due to rising lactate, I ordered additional fluid bolus. However after this was started, she became wheezy and tachypneic.   I came to see patient at bedside, in the mean time Bipap had been resumed.  She denies chest pain or other complaints only SOB, she has some rhonchi and expiratory wheezing globally. Her work of breathing is visibly improving. IV fluids were discontinued for the time being. I discussed with Anders Simmonds NP with PCCM to request a consultation. Patient was transferred to SDU.

## 2022-08-13 NOTE — ED Notes (Signed)
ED TO INPATIENT HANDOFF REPORT  Name/Age/Gender Suzanne Dickson 77 y.o. female  Code Status    Code Status Orders  (From admission, onward)           Start     Ordered   08/13/22 0811  Do not attempt resuscitation (DNR)  Continuous       Question Answer Comment  If patient has no pulse and is not breathing Do Not Attempt Resuscitation   If patient has a pulse and/or is breathing: Medical Treatment Goals LIMITED ADDITIONAL INTERVENTIONS: Use medication/IV fluids and cardiac monitoring as indicated; Do not use intubation or mechanical ventilation (DNI), also provide comfort medications.  Transfer to Progressive/Stepdown as indicated, avoid Intensive Care.   Consent: Discussion documented in EHR or advanced directives reviewed      08/13/22 0811           Code Status History     Date Active Date Inactive Code Status Order ID Comments User Context   05/30/2022 1341 05/31/2022 1636 DNR [id number]  Anice Paganini, NP Inpatient   05/24/2022 1624 05/30/2022 1341 Full Code [id number]  Synetta Fail, MD ED   09/16/2020 0042 09/22/2020 2335 Full Code [id number]  Tim Lair, PA-C ED   12/22/2018 0058 12/26/2018 1828 Full Code [id number]  Charlsie Quest, MD ED   11/24/2018 1146 11/25/2018 1707 Full Code [id number]  Clydie Braun, MD ED   04/05/2017 1328 04/06/2017 1720 Full Code [id number]  Rinaldo Cloud, MD Inpatient   02/17/2017 1142 02/18/2017 1633 Full Code [id number]  Orpah Cobb, MD Inpatient   02/17/2017 0925 02/17/2017 1142 Full Code [id number]  Rinaldo Cloud, MD Inpatient   09/02/2014 1416 09/04/2014 1727 Full Code [id number]  [facility] Inpatient       Home/SNF/Other Home  Chief Complaint Sepsis secondary to UTI (HCC) [A41.9, N39.0]  Level of Care/Admitting Diagnosis ED Disposition     ED Disposition  Admit   Condition  --   Comment  Hospital Area: [facility] [[id number]]  Level of Care: Progressive [102]  Admit to Progressive based on  following criteria: MULTISYSTEM THREATS such as stable sepsis, metabolic/electrolyte imbalance with or without encephalopathy that is responding to early treatment.  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   "I'm in remission" (04/05/2017)   Adenocarcinoma, lung (HCC)    Anxiety    Arthritis    "legs, elbows" (04/05/2017)   Bronchitis, chronic (HCC)    "haven't had it in awhile" (04/05/2017)   CHF (congestive heart failure) (HCC)    COPD (chronic obstructive pulmonary disease) (HCC)    Coronary artery disease    Daily headache    "recently" (04/05/2017)   Depression    Dyspnea    with exertion   GERD (gastroesophageal reflux disease)    History of blood transfusion 1950s   History of radiation therapy 05/04/16 - 05/12/16   Left lung treated to 54 Gy with 3 fx of 18 Gy   Hypertension  On home oxygen therapy    "2L; 24/7" (04/05/2017)   OSA (obstructive sleep apnea)    OSA on CPAP    Pneumonia 10/2015   "at least 3 times" (04/05/2017)   Pulmonary hypertension (HCC)     Allergies No Known Allergies  IV Location/Drains/Wounds Patient Lines/Drains/Airways Status     Active Line/Drains/Airways     Name Placement date Placement time Site Days   Peripheral IV 08/12/22 20 G 1.88" Anterior;Left;Upper Arm 08/12/22  2240  Arm  1            Labs/Imaging Results for orders placed or performed during the hospital encounter of 08/12/22 (from the past 48 hour(s))  Comprehensive metabolic panel      Status: Abnormal   Collection Time: 08/12/22 10:42 PM  Result Value Ref Range   Sodium 139 135 - 145 mmol/L   Potassium 3.7 3.5 - 5.1 mmol/L   Chloride 103 98 - 111 mmol/L   CO2 22 22 - 32 mmol/L   Glucose, Bld 233 (H) 70 - 99 mg/dL    Comment: Glucose reference range applies only to samples taken after fasting for at least 8 hours.   BUN 25 (H) 8 - 23 mg/dL   Creatinine, Ser 9.60 (H) 0.44 - 1.00 mg/dL   Calcium 9.3 8.9 - 45.4 mg/dL   Total Protein 7.6 6.5 - 8.1 g/dL   Albumin 4.1 3.5 - 5.0 g/dL   AST 22 15 - 41 U/L   ALT 23 0 - 44 U/L   Alkaline Phosphatase 47 38 - 126 U/L   Total Bilirubin 1.1 0.3 - 1.2 mg/dL   GFR, Estimated 45 (L) >60 mL/min    Comment: (NOTE) Calculated using the CKD-EPI Creatinine Equation (2021)    Anion gap 14 5 - 15    Comment: Performed at [facility], [address], [place], [place] [address]  CBC with Differential     Status: Abnormal   Collection Time: 08/12/22 10:42 PM  Result Value Ref Range   WBC 9.3 4.0 - 10.5 K/uL   RBC 3.62 (L) 3.87 - 5.11 MIL/uL   Hemoglobin 11.3 (L) 12.0 - 15.0 g/dL   HCT 91.4 78.2 - 95.6 %   MCV 104.1 (H) 80.0 - 100.0 fL   MCH 31.2 26.0 - 34.0 pg   MCHC 30.0 30.0 - 36.0 g/dL   RDW 21.3 08.6 - 57.8 %   Platelets 121 (L) 150 - 400 K/uL   nRBC 0.0 0.0 - 0.2 %   Neutrophils Relative % 77 %   Neutro Abs 7.0 1.7 - 7.7 K/uL   Lymphocytes Relative 14 %   Lymphs Abs 1.3 0.7 - 4.0 K/uL   Monocytes Relative 9 %   Monocytes Absolute 0.9 0.1 - 1.0 K/uL   Eosinophils Relative 0 %   Eosinophils Absolute 0.0 0.0 - 0.5 K/uL   Basophils Relative 0 %   Basophils Absolute 0.0 0.0 - 0.1 K/uL   Immature Granulocytes 0 %   Abs Immature Granulocytes 0.04 0.00 - 0.07 K/uL    Comment: Performed at [facility], [address] [place], [place] [address]  Protime-INR     Status: Abnormal   Collection Time: 08/12/22 10:42 PM  Result Value Ref Range   Prothrombin Time 15.3 (H) 11.4 - 15.2  seconds   INR 1.2 0.8 - 1.2    Comment: (NOTE) INR goal varies based on device and disease states. Performed at [facility], [address] [place], [place] [address]   APTT     Status: None   Collection Time: 08/12/22 10:42 PM  Result Value Ref Range   aPTT 26 24 - 36 seconds    Comment: Performed at [facility], [address] [place], [place] [address]  Blood gas, venous (at [facility] and [facility])     Status: Abnormal   Collection Time: 08/12/22 10:42 PM  Result Value Ref Range   pH, Ven 7.38 7.25 - 7.43   pCO2, Ven 44 44 - 60 mmHg   pO2, Ven 46 (H) 32 - 45 mmHg   Bicarbonate 26.0 20.0 - 28.0 mmol/L   Acid-Base Excess 0.6 0.0 - 2.0 mmol/L   O2 Saturation 80.7 %   Patient temperature 37.0     Comment: Performed at [facility], [address], [place], [place] [address]  Lactic acid, plasma     Status: None   Collection Time: 08/12/22 10:42 PM  Result Value Ref Range   Lactic Acid, Venous 1.9 0.5 - 1.9 mmol/L    Comment: Performed at [facility], [address] [place], [place] [address]  Urinalysis, Routine w reflex microscopic -Urine, Clean Catch     Status: Abnormal   Collection Time: 08/12/22 11:22 PM  Result Value Ref Range   Color, Urine YELLOW YELLOW   APPearance HAZY (A) CLEAR   Specific Gravity, Urine 1.021 1.005 - 1.030   pH 5.0 5.0 - 8.0   Glucose, UA >=500 (A) NEGATIVE mg/dL   Hgb urine dipstick MODERATE (A) NEGATIVE   Bilirubin Urine NEGATIVE NEGATIVE   Ketones, ur 5 (A) NEGATIVE mg/dL   Protein, ur 30 (A) NEGATIVE mg/dL   Nitrite POSITIVE (A) NEGATIVE   Leukocytes,Ua SMALL (A) NEGATIVE   RBC / HPF 0-5 0 - 5 RBC/hpf   WBC, UA >50 0 - 5 WBC/hpf   Bacteria, UA MANY (A) NONE SEEN   Squamous Epithelial / HPF 0-5 0 - 5 /HPF    Comment: Performed at [facility], [address] [place], [place] [address]  SARS Coronavirus 2 by RT PCR (hospital order, performed in [facility] hospital lab) *cepheid single result test* Anterior Nasal Swab     Status: None   Collection Time: 08/12/22 11:55 PM   Specimen: Anterior Nasal Swab  Result Value Ref Range   SARS Coronavirus 2 by RT PCR NEGATIVE NEGATIVE    Comment: (NOTE) SARS-CoV-2 target nucleic acids are NOT DETECTED.  The SARS-CoV-2 RNA is generally detectable in upper and lower respiratory specimens during the acute phase of infection. The lowest concentration of SARS-CoV-2 viral copies this assay can detect is 250 copies / mL. A negative result does not preclude SARS-CoV-2 infection and should not be used as the sole basis for treatment or other patient management decisions.  A negative result may occur with improper specimen collection / handling, submission of specimen other than nasopharyngeal swab, presence of viral mutation(s) within the areas targeted by this assay, and inadequate number of viral copies (<250 copies / mL). A negative result must be combined with clinical observations, patient history, and epidemiological information.  Fact Sheet for Patients:   RoadLapTop.co.za  Fact Sheet for Healthcare Providers: http://kim-miller.com/  This test is not yet approved or  cleared by the [place] FDA and has been authorized for detection and/or diagnosis of SARS-CoV-2 by FDA under an Emergency Use Authorization (EUA).  This EUA will remain in effect (meaning this test can be  used) for the duration of the COVID-19 declaration under Section 564(b)(1) of the Act, 21 U.S.C. section 360bbb-3(b)(1), unless the authorization is terminated or revoked sooner.  Performed at [facility], [address], [place], [place] [address]   Lactic acid, plasma     Status: Abnormal   Collection Time: 08/13/22  2:00 AM  Result Value Ref Range   Lactic Acid, Venous 2.3 (HH) 0.5 - 1.9 mmol/L    Comment: CRITICAL RESULT CALLED TO, READ BACK BY  AND VERIFIED WITH: BRATU, D. EMPT AT 0338 ON 08/13/2022 BY MECIAL J. Performed at [facility], [address] [place], [place] [address]    CT CHEST ABDOMEN PELVIS WO CONTRAST  Result Date: 08/13/2022 CLINICAL DATA:  Concern for pneumonia on chest x-ray. Abdominal distension and nausea. Sepsis. EXAM: CT CHEST, ABDOMEN AND PELVIS WITHOUT CONTRAST TECHNIQUE: Multidetector CT imaging of the chest, abdomen and pelvis was performed following the standard protocol without IV contrast. RADIATION DOSE REDUCTION: This exam was performed according to the departmental dose-optimization program which includes automated exposure control, adjustment of the mA and/or kV according to patient size and/or use of iterative reconstruction technique. COMPARISON:  Abdomen pelvis CT 05/24/2022.  PET-CT 08/13/2022 FINDINGS: CT CHEST FINDINGS Cardiovascular: The heart size is normal. No substantial pericardial effusion. Coronary artery calcification is evident. Moderate atherosclerotic calcification is noted in the wall of the thoracic aorta. Mediastinum/Nodes: No mediastinal lymphadenopathy. No evidence for gross hilar lymphadenopathy although assessment is limited by the lack of intravenous contrast on the current study. The esophagus has normal imaging features. There is no axillary lymphadenopathy. Lungs/Pleura: Centrilobular emphsyema noted. Abnormal soft tissue in the left hilum compatible with the patient's known treated lung cancer, similar in appearance to PET-CT 08/13/2022. 15 mm nodule in the posterior left upper lobe (75/6) is similar to prior. There is some minimal dependent atelectasis in the lung bases. No substantial pleural effusion. Musculoskeletal: No worrisome lytic or sclerotic osseous abnormality. Changes in the posterolateral left sixth rib are stable and may be treatment related. CT ABDOMEN PELVIS FINDINGS Hepatobiliary: No suspicious focal abnormality in the liver on this study without  intravenous contrast. Gallbladder is surgically absent. No intrahepatic or extrahepatic biliary dilation. Pancreas: No focal mass lesion. No dilatation of the main duct. No intraparenchymal cyst. No peripancreatic edema. Spleen: No splenomegaly. No suspicious focal mass lesion. Adrenals/Urinary Tract: No adrenal nodule or mass. Similar appearance bilateral renal cysts better characterized on the previous abdomen/pelvis CT with contrast as Bosniak 1 and 2 lesions. Scattered vascular calcification noted in the hilum of each kidney, likely with superimposed tiny nonobstructing renal stones. The ill-defined area of hypoenhancement seen on the 05/24/2022 exam in the anterior left kidney is not readily evident on today's noncontrast study. No evidence for hydroureter. The urinary bladder appears normal for the degree of distention. Stomach/Bowel: Stomach is unremarkable. No gastric wall thickening. No evidence of outlet obstruction. Duodenum is normally positioned as is the ligament of Treitz. No small bowel wall thickening. No small bowel dilatation. The terminal ileum is normal. The appendix is normal. No gross colonic mass. No colonic wall thickening. Diverticular changes are noted in the left colon without evidence of diverticulitis. Vascular/Lymphatic: There is moderate atherosclerotic calcification of the abdominal aorta without aneurysm. There is no gastrohepatic or hepatoduodenal ligament lymphadenopathy. No retroperitoneal or mesenteric lymphadenopathy. No pelvic sidewall lymphadenopathy. Reproductive: Unremarkable. Other: No intraperitoneal free fluid. Musculoskeletal: No worrisome lytic or sclerotic osseous abnormality. Superior endplate compression deformity noted at T6, T12, L1,  L2, L3, and L4, similar to prior. IMPRESSION: 1. No acute findings in the chest, abdomen, or pelvis. Specifically, no findings to explain the patient's history of sepsis. 2. Abnormal soft tissue in the left lung hilum compatible with  the patient's known treated lung cancer, similar in appearance to PET-CT 08/13/2022. 3. 15 mm nodule in the posterior left upper lobe is similar to prior. This finding was described as concerning for recurrence on the previous PET-CT. 4. The ill-defined area of hypoenhancement in the left kidney seen on the 05/24/2022 exam is not readily evident on today's noncontrast study. 5. Left colonic diverticulosis without diverticulitis. 6. Aortic Atherosclerosis (ICD10-I70.0) and Emphysema (ICD10-J43.9). Electronically Signed   By: Kennith Center M.D.   On: 08/13/2022 06:28   DG Chest Port 1 View  Result Date: 08/12/2022 CLINICAL DATA:  Questionable sepsis - evaluate for abnormality, left lung cancer EXAM: PORTABLE CHEST 1 VIEW COMPARISON:  Chest x-ray 06/09/2022, PET CT 05/20/2022 FINDINGS: The heart and mediastinal contours are unchanged. Aortic calcification. Persistently prominent bilateral hilar regions, left greater than right. Left hilar surgical changes. Question development of retrocardiac airspace opacity. No pulmonary edema. No pleural effusion. No pneumothorax. No acute osseous abnormality. IMPRESSION: 1. Question development of retrocardiac airspace opacity. 2. Persistently prominent bilateral hilar regions, left greater than right. 3. Aortic Atherosclerosis (ICD10-I70.0). Electronically Signed   By: Tish Frederickson M.D.   On: 08/12/2022 22:59    Pending Labs Unresulted Labs (From admission, onward)     Start     Ordered   08/14/22 0500  Comprehensive metabolic panel  Tomorrow morning,   R        08/13/22 0811   08/14/22 0500  CBC  Tomorrow morning,   R        08/13/22 0811   08/13/22 0810  Hemoglobin A1c  (Glycemic Control (SSI)  Q 4 Hours / Glycemic Control (SSI)  AC +/- HS)  Once,   R       Comments: To assess prior glycemic control    08/13/22 0811   08/13/22 0809  MRSA Next Gen by PCR, Nasal  (MRSA Screening)  Once,   URGENT        08/13/22 0808   08/13/22 0808  Lactic acid, plasma  STAT  Now then every 3 hours,   R (with STAT occurrences)      08/13/22 0807   08/12/22 2225  Blood Culture (routine x 2)  (Undifferentiated presentation (screening labs and basic nursing orders))  BLOOD CULTURE X 2,   STAT      08/12/22 2224            Vitals/Pain Today's Vitals   08/13/22 0500 08/13/22 0515 08/13/22 0600 08/13/22 0729  BP: 131/75 134/82 125/75 120/79  Pulse: (!) 109 (!) 107 (!) 103 100  Resp: (!) 26 (!) 22 (!) 27 (!) 25  Temp:    97.6 F (36.4 C)  TempSrc:    Oral  SpO2: 94% 96% 96% 96%  PainSc:        Isolation Precautions No active isolations  Medications Medications  albuterol (PROVENTIL) (2.5 MG/3ML) 0.083% nebulizer solution (10 mg/hr Nebulization Not Given 08/13/22 0734)  sodium chloride 0.9 % bolus 1,000 mL (1,000 mLs Intravenous Started During Downtime 08/13/22 0513)    Followed by  0.9 %  sodium chloride infusion (has no administration in time range)  methylPREDNISolone sodium succinate (SOLU-MEDROL) 40 mg/mL injection 40 mg (has no administration in time range)  piperacillin-tazobactam (ZOSYN) IVPB 3.375 g (has  no administration in time range)  aspirin EC tablet 81 mg (has no administration in time range)  pantoprazole (PROTONIX) EC tablet 40 mg (has no administration in time range)  fluticasone furoate-vilanterol (BREO ELLIPTA) 100-25 MCG/ACT 1 puff (has no administration in time range)    And  umeclidinium bromide (INCRUSE ELLIPTA) 62.5 MCG/ACT 1 puff (has no administration in time range)  loratadine (CLARITIN) tablet 10 mg (has no administration in time range)  insulin aspart (novoLOG) injection 0-15 Units (has no administration in time range)  insulin aspart (novoLOG) injection 0-5 Units (has no administration in time range)  enoxaparin (LOVENOX) injection 40 mg (has no administration in time range)  acetaminophen (TYLENOL) tablet 650 mg (has no administration in time range)    Or  acetaminophen (TYLENOL) suppository 650 mg (has no administration  in time range)  traZODone (DESYREL) tablet 25 mg (has no administration in time range)  albuterol (PROVENTIL) (2.5 MG/3ML) 0.083% nebulizer solution 2.5 mg (has no administration in time range)  metoprolol tartrate (LOPRESSOR) injection 5 mg (has no administration in time range)  ondansetron (ZOFRAN) injection 4 mg (4 mg Intravenous Given 08/12/22 2242)  methylPREDNISolone sodium succinate (SOLU-MEDROL) 125 mg/2 mL injection 125 mg (125 mg Intravenous Given 08/12/22 2244)  sodium chloride 0.9 % bolus 1,000 mL (0 mLs Intravenous Stopped 08/13/22 0108)  vancomycin (VANCOREADY) IVPB 1500 mg/300 mL (0 mg Intravenous Stopped 08/13/22 0200)  piperacillin-tazobactam (ZOSYN) IVPB 3.375 g (0 g Intravenous Stopped 08/12/22 2323)    Mobility walks

## 2022-08-13 NOTE — Progress Notes (Signed)
Date and time results received: 08/13/22 1400 (use smartphrase ".now" to insert current time)   Critical Value: Lactic 3.9  Name of Provider Notified: Kirby Crigler MD  Orders Received? Or Actions Taken?: Awaiting orders.

## 2022-08-13 NOTE — Plan of Care (Signed)

## 2022-08-13 NOTE — IPAL (Signed)
  Interdisciplinary Goals of Care Family Meeting   Date carried out:: 08/13/2022  Location of the meeting: Bedside  Member's involved: Physician  Durable Power of Attorney or acting medical decision maker: self  Discussion: We discussed goals of care for Suzanne Dickson .  I discussed [name]'s care and code status again-- we have spoken about this during a previous admission. She confirms that she wants to be DNR and does not want to be intubated if she gets worse. She endorsed that her husband did not agree, "but he's also not in my shoes" she says. She wants to continue aggressive care currently.   Code status: Full DNR, DNI  Disposition: Continue current acute care   Time spent for the meeting: 5 min.  Steffanie Dunn 08/13/2022, 4:27 PM

## 2022-08-13 NOTE — ED Notes (Signed)
Assumed care of patient. Patient currently sleeping comfortably with no signs of acute distress noted. Patient currently on BiPAP and tolerating well. Pt waiting for admission orders.

## 2022-08-13 NOTE — Consult Note (Addendum)
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 Started 7/8 w/ plan to continue daily during week for planned 10 treatments. Prior to this she had been hospitalized back in April/may 2024 for Proteus UTI and bacteremia presumed UT source. During that stay was transferred to the ICU for worsening respiratory distress which we felt was driven by sepsis and metabolic derrangements than a primary pulmonary problem. We had several discussions about GOC during that stay and consulted palliative at that time too and she decided to be DNR/DNI. Since then she has been undergoing her XRT as mentioned above. Was in usual state of health until 7/18 when she noted subjective fever, chills, some nausea w/ episode of vomiting w/ worsening shortness of breath and pulse ox of 84%, She was seen in the ER. Initial eval:  Mild worsening of acute on chronic renal failure Did not have leukocytosis Initial lactic acid was 1.9 Urinalysis was suggestive of UTI (mod hgb, + nitrates, Many bacteria) CT imaging was negative for acute findings in chest abd and pelvis. Did show previously identified LUL lung nodule and some left basilar scaring (all really unchanged from previous imaging in April) Was admitted w/ working dx of sepsis from UT source Cultures sent Started on ceftriaxone in ER Also started on NIPPV given increased WOB and hypoxia. Initially felt better in ER A f/u lactate was noted at 2.3, she then Received 2 liters of NaCl cont IVFs. In spite of this her lactic acid continued to climb later in the afternoon 7/19 was more short of breath requiring NIPPV again thus PCCM consulted for rising lactate and acute  on chronic respiratory failure    Pertinent  Medical History  Adenocarcinoma Left lung currently undergoing XRT. 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 Depression, GERD, OSA on CPAP, recent hospitalization for Proteus bacteremia from UT source, Diabetes type 2  Significant Hospital Events: Including procedures, antibiotic start and stop dates in addition to other pertinent events   7/18 admitted w/ working dx of UTI started on BIPAP for hypoxia, abx for UTI and received multiple IV fluid boluses 7/19 pccm called for increased work of breathing and rising lactate.   Interim History / Subjective:  Feels better on bipap   Objective   Blood pressure (Abnormal) 175/87, pulse (Abnormal) 115, temperature 98.7 F (37.1 C), temperature source Oral, resp. rate (Abnormal) 22, height 5\' 2"  (1.575 m), weight 69.9 kg, SpO2 100%.    FiO2 (%):  [40 %] 40 %   Intake/Output Summary (Last 24 hours) at 08/13/2022 1557 Last data filed at 08/13/2022 1548 Gross per 24 hour  Intake 2726.85 ml  Output 750 ml  Net 1976.85 ml   Filed Weights   08/13/22 0909  Weight: 69.9 kg    Examination: General: obese 77 year old female resting on NIPPV.  HENT: bipap mask in place.  Lungs: exp wheeze, crackles left base  Cardiovascular: rrr Abdomen: soft, distended, tympanic BS  Extremities: no sig edema  Neuro: awake and oriented  GU: due to void   Resolved Hospital Problem list     Assessment & Plan:  Acute  on chronic hypoxic respiratory failure multifactorial:  early volume overload from decompensated diastolic dysfunction (possibly),  decreased abd compliance from abd distention, & significant anxiety all superimposed on severe end-stage COPD, known adenocarcinoma of Left lung (stable) and OSA (CPAP at HS).  -story fits for possible pulmonary edema CXR really not that impressive. She's better on NIPPV Plan Cont NIPPV (mandatory at HS and PRN  during day)  Agree w/ stopping IVF resuscitation  Repeat lactate now that back on BIPAP  Check BNP, procal  Cont scheduled BDs Cont scheduled IV solumedrol.  ECHO Cont tele  Cont pulse ox  She is DNR/DNI  HFpEF w/ hypertension. She also has known h/o Pulm hypertension (mild by echo)  Plan Cont tele Increase lopressor (avoid carvedilol given COPD) Await echo  Cont treat resp failure   Lactic acidosis rising in spite of volume resuscitation  I wonder if this is more r/t work of breathing than sepsis Plan Repeating lactate Supportive care  Urinary tract infection w/ sepsis. Interestingly I am not convinced her SIRS criteria are driven by her infection as she has been hypertensive  Plan Cont IV rocephin day 2 F/u cultures   Constipation w/ abd distention and isolated N/V Plan Dulcolax  Stop CCB  Acute on chronic renal failure (CKD stage III) Plan Ensure MAP > 65 but avoid hypertension Renal dose meds Strict I&O Serial chem   Diabetes w/ hyperglycemia prob exacerbated by steroids Plan  Cont ssi   Mild thrombocytopenia  Plan Monitor   Best Practice (right click and "Reselect all SmartList Selections" daily)   Diet/type: NPO w/ oral meds DVT prophylaxis: LMWH GI prophylaxis: PPI Lines: N/A Foley:  Yes, and it is still needed Code Status:  full code Last date of multidisciplinary goals of care discussion [pending]  Labs   CBC: Recent Labs  Lab 08/12/22 2242  WBC 9.3  NEUTROABS 7.0  HGB 11.3*  HCT 37.7  MCV 104.1*  PLT 121*    Basic Metabolic Panel: Recent Labs  Lab 08/12/22 2242  NA 139  K 3.7  CL 103  CO2 22  GLUCOSE 233*  BUN 25*  CREATININE 1.24*  CALCIUM 9.3   GFR: Estimated Creatinine Clearance: 35.3 mL/min (A) (by C-G formula based on SCr of 1.24 mg/dL (H)). Recent Labs  Lab 08/12/22 2242 08/13/22 0200 08/13/22 0841 08/13/22 1110  WBC 9.3  --   --   --   LATICACIDVEN 1.9 2.3* 3.1* 3.9*    Liver Function Tests: Recent Labs   Lab 08/12/22 2242  AST 22  ALT 23  ALKPHOS 47  BILITOT 1.1  PROT 7.6  ALBUMIN 4.1   No results for input(s): "LIPASE", "AMYLASE" in the last 168 hours. No results for input(s): "AMMONIA" in the last 168 hours.  ABG    Component Value Date/Time   PHART 7.45 05/28/2022 0340   PCO2ART 44 05/28/2022 0340   PO2ART 62 (L) 05/28/2022 0340   HCO3 26.0 08/12/2022 2242   TCO2 28 05/25/2022 2026   ACIDBASEDEF 0.1 05/24/2022 2201   O2SAT 80.7 08/12/2022 2242     Coagulation Profile: Recent Labs  Lab 08/12/22 2242  INR 1.2    Cardiac Enzymes: No results for input(s): "CKTOTAL", "CKMB", "CKMBINDEX", "TROPONINI" in the last 168 hours.  HbA1C: Hgb A1c MFr Bld  Date/Time Value Ref Range Status  08/13/2022 08:41 AM 6.8 (H) 4.8 - 5.6 % Final    Comment:    (NOTE) Pre diabetes:  5.7%-6.4%  Diabetes:              >6.4%  Glycemic control for   <7.0% adults with diabetes   09/16/2020 04:30 AM 6.4 (H) 4.8 - 5.6 % Final    Comment:    (NOTE) Pre diabetes:          5.7%-6.4%  Diabetes:              >6.4%  Glycemic control for   <7.0% adults with diabetes     CBG: Recent Labs  Lab 08/13/22 1014  GLUCAP 249*    Review of Systems:   Per above   Past Medical History:  She,  has a past medical history of Adenocarcinoma of left lung, stage 1 (HCC) (04/15/2016), Adenocarcinoma, lung (HCC), Anxiety, Arthritis, Bronchitis, chronic (HCC), CHF (congestive heart failure) (HCC), COPD (chronic obstructive pulmonary disease) (HCC), Coronary artery disease, Daily headache, Depression, Dyspnea, GERD (gastroesophageal reflux disease), History of blood transfusion (1950s), History of radiation therapy (05/04/16 - 05/12/16), Hypertension, On home oxygen therapy, OSA (obstructive sleep apnea), OSA on CPAP, Pneumonia (10/2015), and Pulmonary hypertension (HCC).   Surgical History:   Past Surgical History:  Procedure Laterality Date   COLONOSCOPY     CORONARY ANGIOPLASTY WITH STENT  PLACEMENT  04/05/2017   CORONARY STENT INTERVENTION N/A 04/05/2017   Procedure: CORONARY STENT INTERVENTION;  Surgeon: Rinaldo Cloud, MD;  Location: [facility];  Service: Cardiovascular;  Laterality: N/A;   DILATION AND CURETTAGE OF UTERUS     FUDUCIAL PLACEMENT Left 03/31/2016   Procedure: PLACEMENT OF FUDUCIAL LEFT UPPER LOBE;  Surgeon: Leslye Peer, MD;  Location: [facility] OR;  Service: Thoracic;  Laterality: Left;   LAPAROSCOPIC CHOLECYSTECTOMY     LEFT HEART CATH AND CORONARY ANGIOGRAPHY N/A 02/17/2017   Procedure: LEFT HEART CATH AND CORONARY ANGIOGRAPHY;  Surgeon: Rinaldo Cloud, MD;  Location: [facility];  Service: Cardiovascular;  Laterality: N/A;   SINUSOTOMY     TONSILLECTOMY     TUBAL LIGATION     VIDEO BRONCHOSCOPY WITH ENDOBRONCHIAL NAVIGATION N/A 03/31/2016   Procedure: VIDEO BRONCHOSCOPY WITH ENDOBRONCHIAL NAVIGATION;  Surgeon: Leslye Peer, MD;  Location: [facility] OR;  Service: Thoracic;  Laterality: N/A;     Social History:   reports that she quit smoking about 7 years ago. Her smoking use included cigarettes. She started smoking about 47 years ago. She has a 40 pack-year smoking history. She has never used smokeless tobacco. She reports that she does not drink alcohol and does not use drugs.   Family History:  Her family history includes Heart attack in her father and mother.   Allergies No Known Allergies   Home Medications  Prior to Admission medications   Medication Sig Start Date End Date Taking? Authorizing Provider  acetaminophen (TYLENOL) 500 MG tablet Take 1 tablet (500 mg total) by mouth every 6 (six) hours as needed. Patient taking differently: Take 1,000 mg by mouth in the morning and at bedtime. 03/22/21  Yes Nanavati, Ankit, MD  albuterol (PROVENTIL) (2.5 MG/3ML) 0.083% nebulizer solution USE 1 VIAL IN NEBULIZER EVERY 4 HOURS - And As Needed For Shortness of Breath Patient taking differently: Take 2.5 mg by nebulization as needed for wheezing or  shortness of breath. 07/28/22  Yes Nyoka Cowden, MD  albuterol (VENTOLIN HFA) 108 (90 Base) MCG/ACT inhaler INHALE 2 PUFFS BY MOUTH EVERY 4 HOURS AS NEEDED FOR WHEEZING FOR SHORTNESS OF BREATH Patient taking differently: Inhale 2 puffs into the lungs 2 (  two) times daily as needed for wheezing or shortness of breath. 05/21/22  Yes Nyoka Cowden, MD  amLODipine (NORVASC) 5 MG tablet Take 5 mg by mouth daily.   Yes [provider]  Ascorbic Acid (VITAMIN C PO) Take 1 tablet by mouth daily.   Yes [provider]  aspirin EC 81 MG tablet Take 81 mg by mouth daily. Swallow whole.   Yes [provider]  atorvastatin (LIPITOR) 20 MG tablet Take 20 mg by mouth at bedtime. 08/21/20  Yes [provider]  busPIRone (BUSPAR) 7.5 MG tablet Take 7.5 mg by mouth 2 (two) times daily.   Yes [provider]  cetirizine (ZYRTEC) 10 MG tablet Take 10 mg by mouth at bedtime.   Yes [provider]  cholecalciferol (VITAMIN D3) 25 MCG (1000 UNIT) tablet Take 1,000 Units by mouth daily.   Yes [provider]  clopidogrel (PLAVIX) 75 MG tablet Take 75 mg by mouth every morning.    Yes [provider]  Cyanocobalamin (B-12 PO) Take 1 tablet by mouth daily.   Yes [provider]  empagliflozin (JARDIANCE) 10 MG TABS tablet Take 10 mg by mouth daily. 05/20/22 05/20/23 Yes [provider]  ferrous sulfate 325 (65 FE) MG EC tablet Take 975 mg by mouth daily.   Yes [provider]  Fluticasone-Umeclidin-Vilant (TRELEGY ELLIPTA) 100-62.5-25 MCG/ACT AEPB Inhale 1 puff by mouth once daily 08/05/22  Yes Nyoka Cowden, MD  losartan (COZAAR) 50 MG tablet Take 50 mg by mouth every morning. 11/15/18  Yes [provider]  methocarbamol (ROBAXIN) 500 MG tablet Take 500 mg by mouth in the morning and at bedtime. 05/14/21  Yes [provider]  metoprolol succinate (TOPROL-XL) 25 MG 24 hr tablet Take 25 mg by mouth daily.   Yes  [provider]  nitroGLYCERIN (NITRODUR - DOSED IN MG/24 HR) 0.4 mg/hr patch Place 0.4 mg onto the skin every other day. 08/10/17  Yes [provider]  nitroGLYCERIN (NITROSTAT) 0.4 MG SL tablet Place 0.4 mg under the tongue every 5 (five) minutes as needed for chest pain.   Yes [provider]  omeprazole (PRILOSEC) 20 MG capsule Take 20 mg by mouth daily.   Yes [provider]  OXYGEN Inhale 4-7 L into the lungs continuous.   Yes [provider]  Polyvinyl Alcohol-Povidone (REFRESH OP) Place 1 drop into both eyes daily as needed (dry eyes).   Yes [provider]  predniSONE (DELTASONE) 10 MG tablet Start on 5/10 Patient taking differently: Take 20 mg by mouth as needed (breathing issues). 06/04/22  Yes Elgergawy, Leana Roe, MD  pantoprazole (PROTONIX) 40 MG tablet Take 30-60 min before first meal of the day Patient not taking: Reported on 08/13/2022 08/08/18   Nyoka Cowden, MD     Critical care time: 45 min    Simonne Martinet ACNP-BC [facility] Pulmonary/Critical Care Pager # [phone] OR # [phone] if no answer

## 2022-08-13 NOTE — Progress Notes (Signed)
PHARMACY - PHYSICIAN COMMUNICATION CRITICAL VALUE ALERT - BLOOD CULTURE IDENTIFICATION (BCID)  Suzanne Dickson is an 77 y.o. female who presented to [facility] on 08/12/2022 with a chief complaint of urosepsis.    Assessment:  1/3 Blood culture bottles with GNR.  BCID: Ecoli w/o resistance  Name of physician (or Provider) Contacted: Dr Warrick Parisian,   Current antibiotics: Ceftriaxone 2g IV q24h   Changes to prescribed antibiotics recommended:  Patient is on recommended antibiotics - No changes needed  Results for orders placed or performed during the hospital encounter of 08/12/22  Blood Culture ID Panel (Reflexed) (Collected: 08/12/2022 10:40 PM)  Result Value Ref Range   Enterococcus faecalis NOT DETECTED NOT DETECTED   Enterococcus Faecium NOT DETECTED NOT DETECTED   Listeria monocytogenes NOT DETECTED NOT DETECTED   Staphylococcus species NOT DETECTED NOT DETECTED   Staphylococcus aureus (BCID) NOT DETECTED NOT DETECTED   Staphylococcus epidermidis NOT DETECTED NOT DETECTED   Staphylococcus lugdunensis NOT DETECTED NOT DETECTED   Streptococcus species NOT DETECTED NOT DETECTED   Streptococcus agalactiae NOT DETECTED NOT DETECTED   Streptococcus pneumoniae NOT DETECTED NOT DETECTED   Streptococcus pyogenes NOT DETECTED NOT DETECTED   A.calcoaceticus-baumannii NOT DETECTED NOT DETECTED   Bacteroides fragilis NOT DETECTED NOT DETECTED   Enterobacterales DETECTED (A) NOT DETECTED   Enterobacter cloacae complex NOT DETECTED NOT DETECTED   Escherichia coli DETECTED (A) NOT DETECTED   Klebsiella aerogenes NOT DETECTED NOT DETECTED   Klebsiella oxytoca NOT DETECTED NOT DETECTED   Klebsiella pneumoniae NOT DETECTED NOT DETECTED   Proteus species NOT DETECTED NOT DETECTED   Salmonella species NOT DETECTED NOT DETECTED   Serratia marcescens NOT DETECTED NOT DETECTED   Haemophilus influenzae NOT DETECTED NOT DETECTED   Neisseria meningitidis NOT DETECTED NOT DETECTED   Pseudomonas aeruginosa  NOT DETECTED NOT DETECTED   Stenotrophomonas maltophilia NOT DETECTED NOT DETECTED   Candida albicans NOT DETECTED NOT DETECTED   Candida auris NOT DETECTED NOT DETECTED   Candida glabrata NOT DETECTED NOT DETECTED   Candida krusei NOT DETECTED NOT DETECTED   Candida parapsilosis NOT DETECTED NOT DETECTED   Candida tropicalis NOT DETECTED NOT DETECTED   Cryptococcus neoformans/gattii NOT DETECTED NOT DETECTED   CTX-M ESBL NOT DETECTED NOT DETECTED   Carbapenem resistance IMP NOT DETECTED NOT DETECTED   Carbapenem resistance KPC NOT DETECTED NOT DETECTED   Carbapenem resistance NDM NOT DETECTED NOT DETECTED   Carbapenem resist OXA 48 LIKE NOT DETECTED NOT DETECTED   Carbapenem resistance VIM NOT DETECTED NOT DETECTED    Lynann Beaver PharmD, BCPS [facility] main pharmacy [phone] 08/13/2022 7:43 PM

## 2022-08-14 DIAGNOSIS — K5903 Drug induced constipation: Secondary | ICD-10-CM

## 2022-08-14 DIAGNOSIS — N390 Urinary tract infection, site not specified: Secondary | ICD-10-CM | POA: Diagnosis not present

## 2022-08-14 DIAGNOSIS — J9621 Acute and chronic respiratory failure with hypoxia: Secondary | ICD-10-CM | POA: Diagnosis not present

## 2022-08-14 DIAGNOSIS — A419 Sepsis, unspecified organism: Secondary | ICD-10-CM | POA: Diagnosis not present

## 2022-08-14 LAB — CBC
HCT: 34 % — ABNORMAL LOW (ref 36.0–46.0)
Hemoglobin: 10 g/dL — ABNORMAL LOW (ref 12.0–15.0)
MCH: 30.7 pg (ref 26.0–34.0)
MCHC: 29.4 g/dL — ABNORMAL LOW (ref 30.0–36.0)
MCV: 104.3 fL — ABNORMAL HIGH (ref 80.0–100.0)
Platelets: 117 10*3/uL — ABNORMAL LOW (ref 150–400)
RBC: 3.26 MIL/uL — ABNORMAL LOW (ref 3.87–5.11)
RDW: 11.9 % (ref 11.5–15.5)
WBC: 13.8 10*3/uL — ABNORMAL HIGH (ref 4.0–10.5)
nRBC: 0 % (ref 0.0–0.2)

## 2022-08-14 LAB — GLUCOSE, CAPILLARY
Glucose-Capillary: 141 mg/dL — ABNORMAL HIGH (ref 70–99)
Glucose-Capillary: 202 mg/dL — ABNORMAL HIGH (ref 70–99)
Glucose-Capillary: 137 mg/dL — ABNORMAL HIGH (ref 70–99)
Glucose-Capillary: 165 mg/dL — ABNORMAL HIGH (ref 70–99)

## 2022-08-14 LAB — COMPREHENSIVE METABOLIC PANEL
ALT: 23 U/L (ref 0–44)
AST: 21 U/L (ref 15–41)
Albumin: 3.5 g/dL (ref 3.5–5.0)
Alkaline Phosphatase: 39 U/L (ref 38–126)
Anion gap: 7 (ref 5–15)
BUN: 30 mg/dL — ABNORMAL HIGH (ref 8–23)
CO2: 24 mmol/L (ref 22–32)
Calcium: 8.7 mg/dL — ABNORMAL LOW (ref 8.9–10.3)
Chloride: 110 mmol/L (ref 98–111)
Creatinine, Ser: 1.07 mg/dL — ABNORMAL HIGH (ref 0.44–1.00)
GFR, Estimated: 54 mL/min — ABNORMAL LOW (ref >60)
Glucose, Bld: 164 mg/dL — ABNORMAL HIGH (ref 70–99)
Potassium: 4.2 mmol/L (ref 3.5–5.1)
Sodium: 141 mmol/L (ref 135–145)
Total Bilirubin: 0.7 mg/dL (ref 0.3–1.2)
Total Protein: 7 g/dL (ref 6.5–8.1)

## 2022-08-14 LAB — CULTURE, BLOOD (ROUTINE X 2): Culture: NO GROWTH

## 2022-08-14 LAB — URINE CULTURE

## 2022-08-14 MED ORDER — BUSPIRONE HCL 10 MG PO TABS
10.0000 mg | ORAL_TABLET | Freq: Two times a day (BID) | ORAL | Status: DC
  Administered 2022-08-14 – 2022-08-15 (×3): 10 mg via ORAL
  Filled 2022-08-14 (×3): qty 1

## 2022-08-14 MED ORDER — AMLODIPINE BESYLATE 5 MG PO TABS
5.0000 mg | ORAL_TABLET | Freq: Every day | ORAL | Status: DC
  Administered 2022-08-14 – 2022-08-15 (×2): 5 mg via ORAL
  Filled 2022-08-14 (×2): qty 1

## 2022-08-14 MED ORDER — BISACODYL 10 MG RE SUPP
10.0000 mg | Freq: Once | RECTAL | Status: AC
  Administered 2022-08-14: 10 mg via RECTAL
  Filled 2022-08-14: qty 1

## 2022-08-14 MED ORDER — ALPRAZOLAM 0.25 MG PO TABS
0.2500 mg | ORAL_TABLET | Freq: Every evening | ORAL | Status: DC | PRN
  Administered 2022-08-14: 0.25 mg via ORAL
  Filled 2022-08-14: qty 1

## 2022-08-14 MED ORDER — LOSARTAN POTASSIUM 50 MG PO TABS
50.0000 mg | ORAL_TABLET | Freq: Every morning | ORAL | Status: DC
  Administered 2022-08-14 – 2022-08-15 (×2): 50 mg via ORAL
  Filled 2022-08-14 (×2): qty 1

## 2022-08-14 MED ORDER — POLYETHYLENE GLYCOL 3350 17 G PO PACK
17.0000 g | PACK | Freq: Every day | ORAL | Status: DC
  Administered 2022-08-14: 17 g via ORAL
  Filled 2022-08-14 (×2): qty 1

## 2022-08-14 NOTE — Progress Notes (Addendum)
This nurse entered the room to find the patient in resp distress after being transferred to [facility]. Patient was stating she had SOB and upper back pain between her shoulder blades radiating to RUE. Increased O2 to [facility] salter, relaxation techniques, and obtained stat ECG. Patient calmed with relaxation technique. Awaiting ECG results. Will inform nurse Cami, RN for follow up care. Placed ECG results in patient chart (normal with Sinus Tachycardia).

## 2022-08-14 NOTE — Plan of Care (Signed)
  Problem: Coping: Goal: Level of anxiety will decrease Outcome: Not Progressing   

## 2022-08-14 NOTE — Progress Notes (Signed)
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 Started 7/8 w/ plan to continue daily during week for planned 10 treatments. Prior to this she had been hospitalized back in April/may 2024 for Proteus UTI and bacteremia presumed UT source. During that stay was transferred to the ICU for worsening respiratory distress which we felt was driven by sepsis and metabolic derrangements than a primary pulmonary problem. We had several discussions about GOC during that stay and consulted palliative at that time too and she decided to be DNR/DNI. Since then she has been undergoing her XRT as mentioned above. Was in usual state of health until 7/18 when she noted subjective fever, chills, some nausea w/ episode of vomiting w/ worsening shortness of breath and pulse ox of 84%, She was seen in the ER. Initial eval:  Mild worsening of acute on chronic renal failure Did not have leukocytosis Initial lactic acid was 1.9 Urinalysis was suggestive of UTI (mod hgb, + nitrates, Many bacteria) CT imaging was negative for acute findings in chest abd and pelvis. Did show previously identified LUL lung nodule and some left basilar scaring (all really unchanged from previous imaging in April) Was admitted w/ working dx of sepsis from UT source Cultures sent Started on ceftriaxone in ER Also started on NIPPV given increased WOB and hypoxia. Initially felt better in ER A f/u lactate was noted at 2.3, she then Received 2 liters of NaCl cont IVFs. In spite of this her lactic acid continued to climb later in the afternoon 7/19 was more short of breath requiring NIPPV again thus PCCM consulted for rising lactate and acute  on chronic respiratory failure    Pertinent  Medical History  Adenocarcinoma Left lung currently undergoing XRT. 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 Depression, GERD, OSA on CPAP, recent hospitalization for Proteus bacteremia from UT source, Diabetes type 2  Significant Hospital Events: Including procedures, antibiotic start and stop dates in addition to other pertinent events   7/18 admitted w/ working dx of UTI started on BIPAP for hypoxia, abx for UTI and received multiple IV fluid boluses 7/19 pccm called for increased work of breathing and rising lactate.   Interim History / Subjective:  Breathing is improved today. She had 1 BM from dulcolax yesterday but still feels constipated.   Objective   Blood pressure (!) 170/75, pulse 100, temperature 98.2 F (36.8 C), temperature source Oral, resp. rate 20, height 5\' 2"  (1.575 m), weight 72.6 kg, SpO2 98%.    FiO2 (%):  [40 %] 40 %   Intake/Output Summary (Last 24 hours) at 08/14/2022 1035 Last data filed at 08/14/2022 0958 Gross per 24 hour  Intake 3340.81 ml  Output 2800 ml  Net 540.81 ml   Filed Weights   08/13/22 0909 08/13/22 1609  Weight: 69.9 kg 72.6 kg    Examination: General: elderly woman lying in bed watching TV in NAD, now off bipap HENT: [place]/AT, eyes anicteric Lungs: breathing comfortably on [place], CTA Cardiovascular: S1S2, RRR Abdomen: softer today, obese, NT  Extremities: no significant peripheral edema, no cyanosis Neuro: awake, alert, normal speech and answering questions  appropriately Derm: warm, dry  Bicarb 24 BUN 30 Cr 1.07 WBC 13.8 H/H 10/34 Platelets 117 LA 3.9 >2.4> 1.4 Urine culture >100K CFU GNR Echo: LVEF 55-60%. G2DD. Normal RV size and function, moderately elevated RVSP. Mild AI.   Resolved Hospital Problem list     Assessment & Plan:  Acute on chronic hypoxic respiratory failure multifactorial:  early volume overload from  decompensated diastolic dysfunction seems likely,  decreased abd compliance from abd distention, & significant anxiety all superimposed on severe end-stage COPD, known adenocarcinoma of Left lung (stable) and OSA (CPAP at HS).  -improved and now only needing BiPAP at bedtime -goal euvolemia, avoid additional IVF resusciation -con't scheduled bronchodilators- can switch back to DPI now that she is off bipap -PRN duonebs -steroids -echo pending -DNR & DNI-- appropriate with severity of her baseline lung disease  HFpEF w/ hypertension. She also has known h/o Pulm hypertension (moderate by echo) -most likely WHO 3 -goal euvolemia, avoid additional IVF -antihypertensives per primary -need to use caution with non-selective B-blockers given her obstructive lung disease history  Lactic acidosis rising in spite of volume resuscitation > may have been from primary respiratory failure with accessory respiratory muscle use- got better once she was on bipap.  -no additional LA monitoring -supportive care  Urinary tract infection w/ sepsis. -con't ceftriaxone -follow urine culture  Constipation w/ abd distention   -holding CCB -repeat dulcolax -miralax today -likely needs chronic bowel regimen to go with her chronic iron supplementation  Acute on chronic renal failure (CKD stage III) -renally dose meds, avoid nephrotoxic meds -strict I/O -monitor  Chronic IDA Thrombocytopenia due to sepsis -transfuse for Hb <7 or hemodynamically significant bleeding -monitor  Diabetes w/ hyperglycemia prob exacerbated by steroids -per primary  Mild thrombocytopenia  -monitor   Best Practice (right click and "Reselect all SmartList Selections" daily)   Diet/type: Regular consistency (see orders) DVT prophylaxis: LMWH GI prophylaxis: PPI Lines: N/A Foley:  Yes, and it is still needed Code Status:  full code Last date of multidisciplinary goals of care discussion [7/19- see ipal]  Labs    CBC: Recent Labs  Lab 08/12/22 2242 08/14/22 0254  WBC 9.3 13.8*  NEUTROABS 7.0  --   HGB 11.3* 10.0*  HCT 37.7 34.0*  MCV 104.1* 104.3*  PLT 121* 117*    Basic Metabolic Panel: Recent Labs  Lab 08/12/22 2242 08/13/22 1629 08/14/22 0254  NA 139 142 141  K 3.7 4.3 4.2  CL 103 110 110  CO2 22 21* 24  GLUCOSE 233* 234* 164*  BUN 25* 28* 30*  CREATININE 1.24* 1.14* 1.07*  CALCIUM 9.3 8.5* 8.7*   GFR: Estimated Creatinine Clearance: 41.7 mL/min (A) (by C-G formula based on SCr of 1.07 mg/dL (H)). Recent Labs  Lab 08/12/22 2242 08/13/22 0200 08/13/22 0841 08/13/22 1110 08/13/22 1629 08/13/22 2117 08/14/22 0254  WBC 9.3  --   --   --   --   --  13.8*  LATICACIDVEN 1.9   < > 3.1* 3.9* 2.4* 1.4  --    < > = values in this interval not displayed.       Critical care time: n/a    Steffanie Dunn, DO 08/14/22 1:42 PM [facility]  For contact information, see Amion. If no response to pager, please call PCCM consult pager. After hours, 7PM- 7AM, please call Elink.

## 2022-08-14 NOTE — Plan of Care (Signed)
Pt plan of care and goals discussed with time given for questions, pt guide/handbok at bedside.  Problem: Education: Goal: Knowledge of General Education information will improve Description: Including pain rating scale, medication(s)/side effects and non-pharmacologic comfort measures Outcome: Progressing   Problem: Health Behavior/Discharge Planning: Goal: Ability to manage health-related needs will improve Outcome: Progressing   Problem: Clinical Measurements: Goal: Ability to maintain clinical measurements within normal limits will improve Outcome: Progressing Goal: Will remain free from infection Outcome: Progressing Goal: Diagnostic test results will improve Outcome: Progressing Goal: Respiratory complications will improve Outcome: Progressing Goal: Cardiovascular complication will be avoided Outcome: Progressing   Problem: Activity: Goal: Risk for activity intolerance will decrease Outcome: Progressing   Problem: Nutrition: Goal: Adequate nutrition will be maintained Outcome: Progressing   Problem: Coping: Goal: Level of anxiety will decrease Outcome: Progressing   Problem: Elimination: Goal: Will not experience complications related to bowel motility Outcome: Progressing Goal: Will not experience complications related to urinary retention Outcome: Progressing   Problem: Pain Managment: Goal: General experience of comfort will improve Outcome: Progressing   Problem: Safety: Goal: Ability to remain free from injury will improve Outcome: Progressing   Problem: Skin Integrity: Goal: Risk for impaired skin integrity will decrease Outcome: Progressing

## 2022-08-14 NOTE — Progress Notes (Addendum)
PROGRESS NOTE    Suzanne Dickson  UYQ:[id number] DOB: Jun 15, 1945 DOA: 08/12/2022 PCP: Dot Been, FNP   Brief Narrative:   Suzanne Dickson is a 77 y.o. female with medical history significant for non-small cell lung cancer undergoing radiation thereapy, CKD3, COPD on 4-6L O2 chronically, HTN, DM2, and hospital stay 4/29-05/31/22 with severe sepsis due to pyelonephritis who is being admitted with sepsis due to UTI.  Patient was started on antibiotics.  She was volume resuscitated and has developed more shortness of breath and respiratory failure.  She started on BiPAP and PCCM consulted for respiratory failure and elevated lactic acid and transferred to SDU.  CODE STATUS discussed and patient decided to be DNR/DNI.   Assessment & Plan:   Acute on chronic respiratory failure in the setting of volume overload -Required BiPAP.  Discontinued IV fluids. -Echo showed EF of 55 to 60% with grade 2 diastolic dysfunction -Strict INO's and daily weight.   -Continue as needed DuoNebs and steroids. -PCCM on board.  Appreciate help.  Sepsis in the setting of UTI: -Continue Rocephin.  Urine culture positive for gram-negative rods.  Awaiting susceptibilities  Hypertension: -Blood pressure elevated this morning.  Resume losartan and amlodipine -Metoprolol as needed for elevated blood pressure.  Continue to monitor blood pressure  Acute on chronic diastolic CHF: Pulmonary hypertension: -Reviewed echo as above. -Strict INO's and daily weight.  Monitor signs for fluid overload.  Avoid IV fluids.  Acute on chronic CKD stage IIIb: -Improving.  Monitor renal function closely.  Avoid nephrotoxic medications.  Iron deficiency anemia: Continue iron supplements.  Continue to monitor H&H  Anxiety: -Patient has worsening anxiety.  Will go up on BuSpar dose from 7.5 mg to 10 mg twice daily  End stage COPD History of non-small cell lung cancer: -Undergoing radiation therapy.  On 6 to 7 L of oxygen at  home at baseline.  GERD: Continue PPI  Type 2 diabetes: A1c is 6.8..  Continue sliding scale insulin  Leukocytosis: Likely due to steroids.  She is afebrile.  Repeat CBC tomorrow a.m.  Thrombocytopenia: Chronic stable  Lactic acidosis: Resolved  DVT prophylaxis: Lovenox Code Status: DNR/DNI Family Communication: Patient husband present at bedside.  Plan of care discussed with patient in length and she verbalized understanding and agreed with it. Disposition Plan: To be determined  Consultants:  PCCM  Procedures:  None  Antimicrobials:  Rocephin  Status is: Inpatient    Subjective: Patient seen and examined.  Husband at the bedside.  Patient reports she is very anxious, unable to keep BiPAP on due to her anxiety.  Willing to go up on BuSpar dose.  Continues to have elevated blood pressure.  Overall her breathing have improved.  She is concerned about that she missed her radiation therapy that was scheduled yesterday.  Objective: Vitals:   08/14/22 0911 08/14/22 1000 08/14/22 1100 08/14/22 1200  BP: (!) 170/75 (!) 140/70 (!) 153/58 (!) 171/70  Pulse: 100 97 90 99  Resp:  (!) 21 (!) 22 (!) 23  Temp:    98.1 F (36.7 C)  TempSrc:    Oral  SpO2:  97% 98% 98%  Weight:      Height:        Intake/Output Summary (Last 24 hours) at 08/14/2022 1338 Last data filed at 08/14/2022 1100 Gross per 24 hour  Intake 3940.81 ml  Output 2800 ml  Net 1140.81 ml   Filed Weights   08/13/22 0909 08/13/22 1609  Weight: 69.9 kg 72.6 kg  Examination:  General exam: Appears calm and comfortable, receiving DuoNebs, husband at the bedside Respiratory system: Clear to auscultation. Respiratory effort normal. Cardiovascular system: S1 & S2 heard, RRR. No JVD, murmurs, rubs, gallops or clicks. No pedal edema. Gastrointestinal system: Abdomen is nondistended, soft and nontender. No organomegaly or masses felt. Normal bowel sounds heard. Central nervous system: Alert and oriented. No  focal neurological deficits. Extremities: Symmetric 5 x 5 power. Skin: No rashes, lesions or ulcers Psychiatry: Judgement and insight appear normal. Mood & affect appropriate.    Data Reviewed: I have personally reviewed following labs and imaging studies  CBC: Recent Labs  Lab 08/12/22 2242 08/14/22 0254  WBC 9.3 13.8*  NEUTROABS 7.0  --   HGB 11.3* 10.0*  HCT 37.7 34.0*  MCV 104.1* 104.3*  PLT 121* 117*   Basic Metabolic Panel: Recent Labs  Lab 08/12/22 2242 08/13/22 1629 08/14/22 0254  NA 139 142 141  K 3.7 4.3 4.2  CL 103 110 110  CO2 22 21* 24  GLUCOSE 233* 234* 164*  BUN 25* 28* 30*  CREATININE 1.24* 1.14* 1.07*  CALCIUM 9.3 8.5* 8.7*   GFR: Estimated Creatinine Clearance: 41.7 mL/min (A) (by C-G formula based on SCr of 1.07 mg/dL (H)). Liver Function Tests: Recent Labs  Lab 08/12/22 2242 08/13/22 1629 08/14/22 0254  AST 22 22 21   ALT 23 26 23   ALKPHOS 47 40 39  BILITOT 1.1 0.5 0.7  PROT 7.6 7.3 7.0  ALBUMIN 4.1 3.7 3.5   No results for input(s): "LIPASE", "AMYLASE" in the last 168 hours. No results for input(s): "AMMONIA" in the last 168 hours. Coagulation Profile: Recent Labs  Lab 08/12/22 2242  INR 1.2   Cardiac Enzymes: No results for input(s): "CKTOTAL", "CKMB", "CKMBINDEX", "TROPONINI" in the last 168 hours. BNP (last 3 results) No results for input(s): "PROBNP" in the last 8760 hours. HbA1C: Recent Labs    08/13/22 0841  HGBA1C 6.8*   CBG: Recent Labs  Lab 08/13/22 1014 08/13/22 1617 08/13/22 1954 08/14/22 0833 08/14/22 1217  GLUCAP 249* 195* 132* 141* 202*   Lipid Profile: No results for input(s): "CHOL", "HDL", "LDLCALC", "TRIG", "CHOLHDL", "LDLDIRECT" in the last 72 hours. Thyroid Function Tests: No results for input(s): "TSH", "T4TOTAL", "FREET4", "T3FREE", "THYROIDAB" in the last 72 hours. Anemia Panel: No results for input(s): "VITAMINB12", "FOLATE", "FERRITIN", "TIBC", "IRON", "RETICCTPCT" in the last 72  hours. Sepsis Labs: Recent Labs  Lab 08/13/22 0841 08/13/22 1110 08/13/22 1629 08/13/22 2117  LATICACIDVEN 3.1* 3.9* 2.4* 1.4    Recent Results (from the past 240 hour(s))  Blood Culture (routine x 2)     Status: Abnormal (Preliminary result)   Collection Time: 08/12/22 10:40 PM   Specimen: BLOOD LEFT FOREARM  Result Value Ref Range Status   Specimen Description   Final    BLOOD LEFT FOREARM Performed at [facility], [address] [place], [place] [address]    Special Requests   Final    BOTTLES DRAWN AEROBIC ONLY Blood Culture results may not be optimal due to an excessive volume of blood received in culture bottles Performed at [facility], [address], [place], [place] [address]    Culture  Setup Time   Final    GRAM NEGATIVE RODS BOTTLES DRAWN AEROBIC ONLY CRITICAL RESULT CALLED TO, READ BACK BY AND VERIFIED WITH: PHARMD C. SHADE [id number] @1939  FH    Culture (A)  Final    ESCHERICHIA COLI SUSCEPTIBILITIES TO FOLLOW Performed at [facility], [address] [place], [place] [address]    Report Status PENDING  Incomplete  Blood Culture ID Panel (Reflexed)     Status: Abnormal   Collection Time: 08/12/22 10:40 PM  Result Value Ref Range Status   Enterococcus faecalis NOT DETECTED NOT DETECTED Final   Enterococcus Faecium NOT DETECTED NOT DETECTED Final   Listeria monocytogenes NOT DETECTED NOT DETECTED Final   Staphylococcus species NOT DETECTED NOT DETECTED Final   Staphylococcus aureus (BCID) NOT DETECTED NOT DETECTED Final   Staphylococcus epidermidis NOT DETECTED NOT DETECTED Final   Staphylococcus lugdunensis NOT DETECTED NOT DETECTED Final   Streptococcus species NOT DETECTED NOT DETECTED Final   Streptococcus agalactiae NOT DETECTED NOT DETECTED Final   Streptococcus pneumoniae NOT DETECTED NOT DETECTED Final   Streptococcus pyogenes NOT DETECTED NOT DETECTED Final   A.calcoaceticus-baumannii NOT DETECTED NOT DETECTED Final    Bacteroides fragilis NOT DETECTED NOT DETECTED Final   Enterobacterales DETECTED (A) NOT DETECTED Final    Comment: Enterobacterales represent a large order of gram negative bacteria, not a single organism. CRITICAL RESULT CALLED TO, READ BACK BY AND VERIFIED WITH: PHARMD C. SHADE [id number] @1939  FH    Enterobacter cloacae complex NOT DETECTED NOT DETECTED Final   Escherichia coli DETECTED (A) NOT DETECTED Final    Comment: CRITICAL RESULT CALLED TO, READ BACK BY AND VERIFIED WITH: PHARMD C. SHADE [id number] @1939  FH    Klebsiella aerogenes NOT DETECTED NOT DETECTED Final   Klebsiella oxytoca NOT DETECTED NOT DETECTED Final   Klebsiella pneumoniae NOT DETECTED NOT DETECTED Final   Proteus species NOT DETECTED NOT DETECTED Final   Salmonella species NOT DETECTED NOT DETECTED Final   Serratia marcescens NOT DETECTED NOT DETECTED Final   Haemophilus influenzae NOT DETECTED NOT DETECTED Final   Neisseria meningitidis NOT DETECTED NOT DETECTED Final   Pseudomonas aeruginosa NOT DETECTED NOT DETECTED Final   Stenotrophomonas maltophilia NOT DETECTED NOT DETECTED Final   Candida albicans NOT DETECTED NOT DETECTED Final   Candida auris NOT DETECTED NOT DETECTED Final   Candida glabrata NOT DETECTED NOT DETECTED Final   Candida krusei NOT DETECTED NOT DETECTED Final   Candida parapsilosis NOT DETECTED NOT DETECTED Final   Candida tropicalis NOT DETECTED NOT DETECTED Final   Cryptococcus neoformans/gattii NOT DETECTED NOT DETECTED Final   CTX-M ESBL NOT DETECTED NOT DETECTED Final   Carbapenem resistance IMP NOT DETECTED NOT DETECTED Final   Carbapenem resistance KPC NOT DETECTED NOT DETECTED Final   Carbapenem resistance NDM NOT DETECTED NOT DETECTED Final   Carbapenem resist OXA 48 LIKE NOT DETECTED NOT DETECTED Final   Carbapenem resistance VIM NOT DETECTED NOT DETECTED Final    Comment: Performed at [facility], [address], [place], [place] [address]  Blood Culture (routine x 2)      Status: None (Preliminary result)   Collection Time: 08/12/22 10:42 PM   Specimen: BLOOD  Result Value Ref Range Status   Specimen Description   Final    BLOOD BLOOD LEFT ARM Performed at [facility], [address] [place], [place] [address]    Special Requests   Final    BOTTLES DRAWN AEROBIC AND ANAEROBIC Blood Culture results may not be optimal due to an inadequate volume of blood received in culture bottles Performed at [facility], [address] [place], [place] [address]    Culture   Final    NO GROWTH 2 DAYS Performed at [facility], [address] [place], [place] [address]    Report Status PENDING  Incomplete  Urine Culture (for pregnant, neutropenic or urologic patients or patients with an indwelling urinary catheter)     Status: Abnormal (Preliminary result)   Collection Time: 08/12/22 11:22 PM   Specimen: Urine, Clean Catch  Result Value Ref Range Status   Specimen Description   Final    URINE, CLEAN CATCH Performed at [facility], [address], [place], [place] [address]    Special Requests   Final    NONE Performed at [facility], [address], [place], [place] [address]    Culture (A)  Final    >=100,000 COLONIES/mL GRAM NEGATIVE RODS SUSCEPTIBILITIES TO FOLLOW Performed at [facility], [address] [place], [place] [address]    Report Status PENDING  Incomplete  SARS Coronavirus 2 by RT PCR (hospital order, performed in [facility] hospital lab) *cepheid single result test* Anterior Nasal Swab     Status: None   Collection Time: 08/12/22 11:55 PM   Specimen: Anterior Nasal Swab  Result Value Ref Range Status   SARS Coronavirus 2 by RT PCR NEGATIVE NEGATIVE Final    Comment: (NOTE) SARS-CoV-2 target nucleic acids are NOT DETECTED.  The SARS-CoV-2 RNA is generally detectable in upper and lower respiratory specimens during the acute phase of infection.  The lowest concentration of SARS-CoV-2 viral copies this assay can detect is 250 copies / mL. A negative result does not preclude SARS-CoV-2 infection and should not be used as the sole basis for treatment or other patient management decisions.  A negative result may occur with improper specimen collection / handling, submission of specimen other than nasopharyngeal swab, presence of viral mutation(s) within the areas targeted by this assay, and inadequate number of viral copies (<250 copies / mL). A negative result must be combined with clinical observations, patient history, and epidemiological information.  Fact Sheet for Patients:   RoadLapTop.co.za  Fact Sheet for Healthcare Providers: http://kim-miller.com/  This test is not yet approved or  cleared by the [place] FDA and has been authorized for detection and/or diagnosis of SARS-CoV-2 by FDA under an Emergency Use Authorization (EUA).  This EUA will remain in effect (meaning this test can be used) for the duration of the COVID-19 declaration under Section 564(b)(1) of the Act, 21 U.S.C. section 360bbb-3(b)(1), unless the authorization is terminated or revoked sooner.  Performed at [facility], [address] [place], [place] [address]   MRSA Next Gen by PCR, Nasal     Status: None   Collection Time: 08/13/22  3:52 PM   Specimen: Nasal Mucosa; Nasal Swab  Result Value Ref Range Status   MRSA by PCR Next Gen NOT DETECTED NOT DETECTED Final    Comment: (NOTE) The GeneXpert MRSA Assay (FDA approved for NASAL specimens only), is one component of a comprehensive MRSA colonization surveillance program. It is not intended to diagnose MRSA infection nor to guide or monitor treatment for MRSA infections. Test performance is not FDA approved in patients less than 50 years old. Performed at [facility], [address], [place], [place]  [address]       Radiology Studies: ECHOCARDIOGRAM COMPLETE  Result Date: 08/13/2022    ECHOCARDIOGRAM REPORT   Patient Name:   MARYKATHRYN CARBONI Date of Exam: 08/13/2022 Medical Rec #:  [id number]       Height:       62.0 in Accession #:  [id number]      Weight:       160.1 lb Date of Birth:  Feb 02, 1945      BSA:          1.739 m Patient Age:    76 years        BP:           175/87 mmHg Patient Gender: F               HR:           102 bpm. Exam Location:  Inpatient Procedure: 2D Echo, Cardiac Doppler and Color Doppler STAT ECHO Indications:    Acute respiratory distress  History:        Patient has prior history of Echocardiogram examinations. CHF,                 CAD, COPD, PAD and cancer, CKD, Signs/Symptoms:Shortness of                 Breath; Risk Factors:Hypertension, Sleep Apnea, Former Smoker                 and Diabetes. Sepsis, respiratory failure.  Sonographer:    Wallie Char Referring Phys: [id number] PETER E BABCOCK  Sonographer Comments: Messaged MD @ 5:13pm IMPRESSIONS  1. Left ventricular ejection fraction, by estimation, is 55 to 60%. The left ventricle has normal function. The left ventricle has no regional wall motion abnormalities. Left ventricular diastolic parameters are consistent with Grade II diastolic dysfunction (pseudonormalization).  2. Right ventricular systolic function is normal. The right ventricular size is normal. There is moderately elevated pulmonary artery systolic pressure. The estimated right ventricular systolic pressure is 57.8 mmHg.  3. The mitral valve is normal in structure. No evidence of mitral valve regurgitation. No evidence of mitral stenosis.  4. The aortic valve is tricuspid. There is mild calcification of the aortic valve. Aortic valve regurgitation is mild.  5. The inferior vena cava is normal in size with greater than 50% respiratory variability, suggesting right atrial pressure of 3 mmHg. FINDINGS  Left Ventricle: Left ventricular ejection fraction, by  estimation, is 55 to 60%. The left ventricle has normal function. The left ventricle has no regional wall motion abnormalities. The left ventricular internal cavity size was normal in size. There is  no left ventricular hypertrophy. Left ventricular diastolic parameters are consistent with Grade II diastolic dysfunction (pseudonormalization). Right Ventricle: The right ventricular size is normal. No increase in right ventricular wall thickness. Right ventricular systolic function is normal. There is moderately elevated pulmonary artery systolic pressure. The tricuspid regurgitant velocity is 3.70 m/s, and with an assumed right atrial pressure of 3 mmHg, the estimated right ventricular systolic pressure is 57.8 mmHg. Left Atrium: Left atrial size was normal in size. Right Atrium: Right atrial size was normal in size. Pericardium: There is no evidence of pericardial effusion. Mitral Valve: The mitral valve is normal in structure. No evidence of mitral valve regurgitation. No evidence of mitral valve stenosis. MV peak gradient, 4.2 mmHg. The mean mitral valve gradient is 2.0 mmHg. Tricuspid Valve: The tricuspid valve is normal in structure. Tricuspid valve regurgitation is mild. Aortic Valve: The aortic valve is tricuspid. There is mild calcification of the aortic valve. Aortic valve regurgitation is mild. Aortic regurgitation PHT measures 344 msec. Aortic valve mean gradient measures 4.0 mmHg. Aortic valve peak gradient measures 6.9 mmHg. Aortic valve area, by VTI measures 2.16 cm. Pulmonic Valve: The pulmonic valve was normal in structure. Pulmonic  valve regurgitation is trivial. Aorta: The aortic root is normal in size and structure. Venous: The inferior vena cava is normal in size with greater than 50% respiratory variability, suggesting right atrial pressure of 3 mmHg. IAS/Shunts: No atrial level shunt detected by color flow Doppler.  LEFT VENTRICLE PLAX 2D LVIDd:         4.40 cm     Diastology LVIDs:          3.20 cm     LV e' medial:    4.56 cm/s LV PW:         1.00 cm     LV E/e' medial:  25.9 LV IVS:        0.70 cm     LV e' lateral:   8.12 cm/s LVOT diam:     1.90 cm     LV E/e' lateral: 14.5 LV SV:         51 LV SV Index:   30 LVOT Area:     2.84 cm  LV Volumes (MOD) LV vol d, MOD A2C: 59.2 ml LV vol d, MOD A4C: 69.6 ml LV vol s, MOD A2C: 27.8 ml LV vol s, MOD A4C: 31.1 ml LV SV MOD A2C:     31.4 ml LV SV MOD A4C:     69.6 ml LV SV MOD BP:      34.8 ml RIGHT VENTRICLE             IVC RV Basal diam:  3.00 cm     IVC diam: 1.40 cm RV S prime:     13.50 cm/s TAPSE (M-mode): 2.0 cm LEFT ATRIUM             Index        RIGHT ATRIUM           Index LA diam:        3.30 cm 1.90 cm/m   RA Area:     13.50 cm LA Vol (A2C):   22.1 ml 12.71 ml/m  RA Volume:   33.20 ml  19.09 ml/m LA Vol (A4C):   23.9 ml 13.74 ml/m LA Biplane Vol: 24.3 ml 13.97 ml/m  AORTIC VALVE AV Area (Vmax):    2.40 cm AV Area (Vmean):   2.11 cm AV Area (VTI):     2.16 cm AV Vmax:           131.50 cm/s AV Vmean:          89.450 cm/s AV VTI:            0.238 m AV Peak Grad:      6.9 mmHg AV Mean Grad:      4.0 mmHg LVOT Vmax:         111.50 cm/s LVOT Vmean:        66.650 cm/s LVOT VTI:          0.181 m LVOT/AV VTI ratio: 0.76 AI PHT:            344 msec AR Vena Contracta: 0.30 cm  AORTA Ao Root diam: 3.30 cm Ao Asc diam:  2.90 cm MITRAL VALVE                TRICUSPID VALVE MV Area (PHT): 6.27 cm     TR Peak grad:   54.8 mmHg MV Area VTI:   3.62 cm     TR Vmax:        370.00 cm/s MV Peak grad:  4.2 mmHg MV Mean  grad:  2.0 mmHg     SHUNTS MV Vmax:       1.03 m/s     Systemic VTI:  0.18 m MV Vmean:      59.4 cm/s    Systemic Diam: 1.90 cm MV Decel Time: 121 msec MV E velocity: 118.00 cm/s MV A velocity: 97.70 cm/s MV E/A ratio:  1.21 Dalton McleanMD Electronically signed by Wilfred Lacy Signature Date/Time: 08/13/2022/5:25:48 PM    Final    DG Chest Port 1 View  Result Date: 08/13/2022 CLINICAL DATA:  Acute on chronic respiratory failure EXAM:  PORTABLE CHEST 1 VIEW COMPARISON:  Chest x-ray 08/12/2022.  Chest CT 08/03/2022. FINDINGS: There are postsurgical changes in the left hilar region which appears similar to the prior study with some perihilar opacities. There is no new focal lung infiltrate, pleural effusion or pneumothorax. The cardiac silhouette is stable. No acute fractures are seen. IMPRESSION: Postsurgical changes in the left hilar region. No new focal lung infiltrate, pleural effusion or pneumothorax. Electronically Signed   By: Darliss Cheney M.D.   On: 08/13/2022 17:25   CT CHEST ABDOMEN PELVIS WO CONTRAST  Result Date: 08/13/2022 CLINICAL DATA:  Concern for pneumonia on chest x-ray. Abdominal distension and nausea. Sepsis. EXAM: CT CHEST, ABDOMEN AND PELVIS WITHOUT CONTRAST TECHNIQUE: Multidetector CT imaging of the chest, abdomen and pelvis was performed following the standard protocol without IV contrast. RADIATION DOSE REDUCTION: This exam was performed according to the departmental dose-optimization program which includes automated exposure control, adjustment of the mA and/or kV according to patient size and/or use of iterative reconstruction technique. COMPARISON:  Abdomen pelvis CT 05/24/2022.  PET-CT 08/13/2022 FINDINGS: CT CHEST FINDINGS Cardiovascular: The heart size is normal. No substantial pericardial effusion. Coronary artery calcification is evident. Moderate atherosclerotic calcification is noted in the wall of the thoracic aorta. Mediastinum/Nodes: No mediastinal lymphadenopathy. No evidence for gross hilar lymphadenopathy although assessment is limited by the lack of intravenous contrast on the current study. The esophagus has normal imaging features. There is no axillary lymphadenopathy. Lungs/Pleura: Centrilobular emphsyema noted. Abnormal soft tissue in the left hilum compatible with the patient's known treated lung cancer, similar in appearance to PET-CT 08/13/2022. 15 mm nodule in the posterior left upper lobe  (75/6) is similar to prior. There is some minimal dependent atelectasis in the lung bases. No substantial pleural effusion. Musculoskeletal: No worrisome lytic or sclerotic osseous abnormality. Changes in the posterolateral left sixth rib are stable and may be treatment related. CT ABDOMEN PELVIS FINDINGS Hepatobiliary: No suspicious focal abnormality in the liver on this study without intravenous contrast. Gallbladder is surgically absent. No intrahepatic or extrahepatic biliary dilation. Pancreas: No focal mass lesion. No dilatation of the main duct. No intraparenchymal cyst. No peripancreatic edema. Spleen: No splenomegaly. No suspicious focal mass lesion. Adrenals/Urinary Tract: No adrenal nodule or mass. Similar appearance bilateral renal cysts better characterized on the previous abdomen/pelvis CT with contrast as Bosniak 1 and 2 lesions. Scattered vascular calcification noted in the hilum of each kidney, likely with superimposed tiny nonobstructing renal stones. The ill-defined area of hypoenhancement seen on the 05/24/2022 exam in the anterior left kidney is not readily evident on today's noncontrast study. No evidence for hydroureter. The urinary bladder appears normal for the degree of distention. Stomach/Bowel: Stomach is unremarkable. No gastric wall thickening. No evidence of outlet obstruction. Duodenum is normally positioned as is the ligament of Treitz. No small bowel wall thickening. No small bowel dilatation. The terminal ileum is normal. The appendix is normal.  No gross colonic mass. No colonic wall thickening. Diverticular changes are noted in the left colon without evidence of diverticulitis. Vascular/Lymphatic: There is moderate atherosclerotic calcification of the abdominal aorta without aneurysm. There is no gastrohepatic or hepatoduodenal ligament lymphadenopathy. No retroperitoneal or mesenteric lymphadenopathy. No pelvic sidewall lymphadenopathy. Reproductive: Unremarkable. Other: No  intraperitoneal free fluid. Musculoskeletal: No worrisome lytic or sclerotic osseous abnormality. Superior endplate compression deformity noted at T6, T12, L1, L2, L3, and L4, similar to prior. IMPRESSION: 1. No acute findings in the chest, abdomen, or pelvis. Specifically, no findings to explain the patient's history of sepsis. 2. Abnormal soft tissue in the left lung hilum compatible with the patient's known treated lung cancer, similar in appearance to PET-CT 08/13/2022. 3. 15 mm nodule in the posterior left upper lobe is similar to prior. This finding was described as concerning for recurrence on the previous PET-CT. 4. The ill-defined area of hypoenhancement in the left kidney seen on the 05/24/2022 exam is not readily evident on today's noncontrast study. 5. Left colonic diverticulosis without diverticulitis. 6. Aortic Atherosclerosis (ICD10-I70.0) and Emphysema (ICD10-J43.9). Electronically Signed   By: Kennith Center M.D.   On: 08/13/2022 06:28   DG Chest Port 1 View  Result Date: 08/12/2022 CLINICAL DATA:  Questionable sepsis - evaluate for abnormality, left lung cancer EXAM: PORTABLE CHEST 1 VIEW COMPARISON:  Chest x-ray 06/09/2022, PET CT 05/20/2022 FINDINGS: The heart and mediastinal contours are unchanged. Aortic calcification. Persistently prominent bilateral hilar regions, left greater than right. Left hilar surgical changes. Question development of retrocardiac airspace opacity. No pulmonary edema. No pleural effusion. No pneumothorax. No acute osseous abnormality. IMPRESSION: 1. Question development of retrocardiac airspace opacity. 2. Persistently prominent bilateral hilar regions, left greater than right. 3. Aortic Atherosclerosis (ICD10-I70.0). Electronically Signed   By: Tish Frederickson M.D.   On: 08/12/2022 22:59    Scheduled Meds:  amLODipine  5 mg Oral Daily   arformoterol  15 mcg Nebulization BID   aspirin EC  81 mg Oral Daily   budesonide (PULMICORT) nebulizer solution  0.25 mg  Nebulization BID   busPIRone  10 mg Oral BID   Chlorhexidine Gluconate Cloth  6 each Topical Daily   clopidogrel  75 mg Oral q morning   enoxaparin (LOVENOX) injection  40 mg Subcutaneous Q24H   ferrous sulfate  650 mg Oral Daily   insulin aspart  0-15 Units Subcutaneous TID WC   insulin aspart  0-5 Units Subcutaneous QHS   loratadine  10 mg Oral Daily   losartan  50 mg Oral q morning   methylPREDNISolone (SOLU-MEDROL) injection  40 mg Intravenous Q12H   metoprolol succinate  50 mg Oral Daily   mouth rinse  15 mL Mouth Rinse 4 times per day   pantoprazole  40 mg Oral Daily   polyethylene glycol  17 g Oral Daily   revefenacin  175 mcg Nebulization Daily   Continuous Infusions:  sodium chloride Stopped (08/14/22 0315)   cefTRIAXone (ROCEPHIN)  IV Stopped (08/14/22 0950)     LOS: 1 day   Time spent: 35 minutes   Angellina Ferdinand Estill Cotta, MD [facility]  If 7PM-7AM, please contact night-coverage [email or web address] 08/14/2022, 1:38 PM

## 2022-08-15 DIAGNOSIS — J432 Centrilobular emphysema: Secondary | ICD-10-CM

## 2022-08-15 DIAGNOSIS — A419 Sepsis, unspecified organism: Secondary | ICD-10-CM | POA: Diagnosis not present

## 2022-08-15 DIAGNOSIS — N390 Urinary tract infection, site not specified: Secondary | ICD-10-CM | POA: Diagnosis not present

## 2022-08-15 LAB — BASIC METABOLIC PANEL
Anion gap: 8 (ref 5–15)
BUN: 40 mg/dL — ABNORMAL HIGH (ref 8–23)
CO2: 25 mmol/L (ref 22–32)
Calcium: 9 mg/dL (ref 8.9–10.3)
Chloride: 109 mmol/L (ref 98–111)
Creatinine, Ser: 1.04 mg/dL — ABNORMAL HIGH (ref 0.44–1.00)
GFR, Estimated: 56 mL/min — ABNORMAL LOW (ref >60)
Glucose, Bld: 182 mg/dL — ABNORMAL HIGH (ref 70–99)
Potassium: 4.5 mmol/L (ref 3.5–5.1)
Sodium: 142 mmol/L (ref 135–145)

## 2022-08-15 LAB — CBC
HCT: 33.5 % — ABNORMAL LOW (ref 36.0–46.0)
Hemoglobin: 10.1 g/dL — ABNORMAL LOW (ref 12.0–15.0)
MCH: 31.2 pg (ref 26.0–34.0)
MCHC: 30.1 g/dL (ref 30.0–36.0)
MCV: 103.4 fL — ABNORMAL HIGH (ref 80.0–100.0)
Platelets: 128 10*3/uL — ABNORMAL LOW (ref 150–400)
RBC: 3.24 MIL/uL — ABNORMAL LOW (ref 3.87–5.11)
RDW: 11.9 % (ref 11.5–15.5)
WBC: 13.1 10*3/uL — ABNORMAL HIGH (ref 4.0–10.5)
nRBC: 0 % (ref 0.0–0.2)

## 2022-08-15 LAB — URINE CULTURE: Culture: >=100000 — AB

## 2022-08-15 LAB — GLUCOSE, CAPILLARY
Glucose-Capillary: 197 mg/dL — ABNORMAL HIGH (ref 70–99)
Glucose-Capillary: 251 mg/dL — ABNORMAL HIGH (ref 70–99)

## 2022-08-15 LAB — CULTURE, BLOOD (ROUTINE X 2)

## 2022-08-15 LAB — MAGNESIUM: Magnesium: 2.5 mg/dL — ABNORMAL HIGH (ref 1.7–2.4)

## 2022-08-15 MED ORDER — CIPROFLOXACIN HCL 500 MG PO TABS: 500.0000 mg | ORAL_TABLET | Freq: Two times a day (BID) | ORAL | 0 refills | Status: AC

## 2022-08-15 MED ORDER — BUSPIRONE HCL 10 MG PO TABS: 10.0000 mg | ORAL_TABLET | Freq: Two times a day (BID) | ORAL | 0 refills | Status: AC

## 2022-08-15 MED ORDER — ALBUTEROL SULFATE HFA 108 (90 BASE) MCG/ACT IN AERS: 2.0000 | INHALATION_SPRAY | Freq: Two times a day (BID) | RESPIRATORY_TRACT | 0 refills | Status: DC | PRN

## 2022-08-15 NOTE — Progress Notes (Signed)
Discharge information provided, all questions answered. PIV removed. All belongings returned. Patient transported to main entrance via wheelchair. Assisted into vehicle safely.

## 2022-08-15 NOTE — Progress Notes (Signed)
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 Started 7/8 w/ plan to continue daily during week for planned 10 treatments. Prior to this she had been hospitalized back in April/may 2024 for Proteus UTI and bacteremia presumed UT source. During that stay was transferred to the ICU for worsening respiratory distress which we felt was driven by sepsis and metabolic derrangements than a primary pulmonary problem. We had several discussions about GOC during that stay and consulted palliative at that time too and she decided to be DNR/DNI. Since then she has been undergoing her XRT as mentioned above. Was in usual state of health until 7/18 when she noted subjective fever, chills, some nausea w/ episode of vomiting w/ worsening shortness of breath and pulse ox of 84%, She was seen in the ER. Initial eval:  Mild worsening of acute on chronic renal failure Did not have leukocytosis Initial lactic acid was 1.9 Urinalysis was suggestive of UTI (mod hgb, + nitrates, Many bacteria) CT imaging was negative for acute findings in chest abd and pelvis. Did show previously identified LUL lung nodule and some left basilar scaring (all really unchanged from previous imaging in April) Was admitted w/ working dx of sepsis from UT source Cultures sent Started on ceftriaxone in ER Also started on NIPPV given increased WOB and hypoxia. Initially felt better in ER A f/u lactate was noted at 2.3, she then Received 2 liters of NaCl cont IVFs. In spite of this her lactic acid continued to climb later in the afternoon 7/19 was more short of breath requiring NIPPV again thus PCCM consulted for rising lactate and acute  on chronic respiratory failure    Pertinent  Medical History  Adenocarcinoma Left lung currently undergoing XRT. 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 Depression, GERD, OSA on CPAP, recent hospitalization for Proteus bacteremia from UT source, Diabetes type 2  Significant Hospital Events: Including procedures, antibiotic start and stop dates in addition to other pertinent events   7/18 admitted w/ working dx of UTI started on BIPAP for hypoxia, abx for UTI and received multiple IV fluid boluses 7/19 pccm called for increased work of breathing and rising lactate.   Interim History / Subjective:  Breathing feels stable, no new complaints.  Is been out of bed yesterday.  Objective   Blood pressure (!) 161/64, pulse 84, temperature 97.9 F (36.6 C), temperature source Oral, resp. rate (!) 21, height 5\' 2"  (1.575 m), weight 72.6 kg, SpO2 99%.        Intake/Output Summary (Last 24 hours) at 08/15/2022 7425 Last data filed at 08/15/2022 9563 Gross per 24 hour  Intake 1420 ml  Output 2300 ml  Net -880 ml   Filed Weights   08/13/22 0909 08/13/22 1609  Weight: 69.9 kg 72.6 kg    Examination: General: Chronically ill-appearing woman sitting up in bed no acute distress HENT: [place]/AT, eyes anicteric Lungs: Breathing comfortably nasal cannula, no wheezing Cardiovascular: S1-S2, regular rate and rhythm Abdomen: Obese, soft, nontender Extremities: Significant peripheral edema, no cyanosis or clubbing Neuro: Awake, alert, answering questions appropriately moving all extremities.  No focal deficits. Derm:  Warm, dry, no diffuse rashes  Bicarb 25 BUN 40 Cr 1.04 WBC 13.1 H/H 10.1/33.5 Platelets 128   Urine culture >100K CFU GNR Blood culture: E Coli  Echo: LVEF 55-60%. G2DD. Normal RV size and function, moderately elevated RVSP. Mild AI.   Resolved Hospital Problem list     Assessment & Plan:  Acute on chronic hypoxic  respiratory failure multifactorial:  early volume overload from decompensated diastolic dysfunction seems likely,  decreased abd compliance from abd distention, & sepsis and metabolic acodis on severe end-stage COPD with limited ability to compensate Known adenocarcinoma of Left lung (stable)  OSA (CPAP at HS) -CPAP nightly - Goal for euvolemia - Can transition back to dry powder inhalers-discharge on PTA regimen - Can discharge on PTA prednisone, unlikely that this was a primary lung problem inciting her decompensation -DNR & DNI-- appropriate with severity of her baseline lung disease  E Coli bacteremia & urinary tract infection with sepsis, lactic acidois  -Antibiotics per primary  HFpEF w/ hypertension. 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  -Supportive care  Constipation w/ abd distention   -Recommend discharge and more aggressive bowel regimen to account for her iron supplementation induced constipation  Acute on chronic renal failure (CKD stage III)  Chronic IDA Thrombocytopenia due to sepsis -Iron and bowel  Diabetes w/ hyperglycemia prob exacerbated by steroids -For primary  Mild thrombocytopenia  -Monitor as an outpatient  Stable from pulmonary standpoint for discharge, discussed with Dr. Jacqulyn Bath.   Best Practice (right click and "Reselect all SmartList Selections" daily)   Per primary  Labs   CBC: Recent Labs  Lab 08/12/22 2242 08/14/22 0254 08/15/22 0256  WBC 9.3 13.8* 13.1*  NEUTROABS 7.0  --   --   HGB 11.3* 10.0* 10.1*  HCT 37.7 34.0* 33.5*  MCV 104.1* 104.3* 103.4*  PLT 121* 117* 128*    Basic Metabolic Panel: Recent Labs  Lab 08/12/22 2242 08/13/22 1629 08/14/22 0254 08/15/22 0256  NA 139 142 141 142  K 3.7  4.3 4.2 4.5  CL 103 110 110 109  CO2 22 21* 24 25  GLUCOSE 233* 234* 164* 182*  BUN 25* 28* 30* 40*  CREATININE 1.24* 1.14* 1.07* 1.04*  CALCIUM 9.3 8.5* 8.7* 9.0  MG  --   --   --  2.5*   GFR: Estimated Creatinine Clearance: 42.9 mL/min (A) (by C-G formula based on SCr of 1.04 mg/dL (H)). Recent Labs  Lab 08/12/22 2242 08/13/22 0200 08/13/22 0841 08/13/22 1110 08/13/22 1629 08/13/22 2117 08/14/22 0254 08/15/22 0256  WBC 9.3  --   --   --   --   --  13.8* 13.1*  LATICACIDVEN 1.9   < > 3.1* 3.9* 2.4* 1.4  --   --    < > = values in this interval not displayed.       Critical care time: n/a    Steffanie Dunn, DO 08/15/22 11:01 AM [facility]  For contact information, see Amion. If no response to pager, please call PCCM consult pager. After hours, 7PM- 7AM, please call Elink.

## 2022-08-15 NOTE — Plan of Care (Signed)

## 2022-08-15 NOTE — Discharge Summary (Signed)
Physician Discharge Summary  Suzanne Dickson QIO:[id number] DOB: Apr 11, 1945 DOA: 08/12/2022  PCP: Dot Been, FNP  Admit date: 08/12/2022 Discharge date: 08/15/2022  Admitted From: Home Disposition:  Home  Recommendations for Outpatient Follow-up:  Follow up with PCP in 1-2 weeks Please obtain BMP/CBC in one week Start Cipro 500 mg twice a day for 7 days Follow-up with oncology as a scheduled for radiation treatment   Home Health: None Equipment/Devices: None Discharge Condition: Stable: CODE STATUS: DNR/DNI Diet recommendation: Low-sodium diet  Brief/Interim Summary:  Suzanne Dickson is a 77 y.o. female with medical history significant for non-small cell lung cancer undergoing radiation thereapy, CKD3, COPD on 4-6L O2 chronically, HTN, DM2, and hospital stay 4/29-05/31/22 with severe sepsis due to pyelonephritis who is being admitted with sepsis due to UTI.  Patient was started on antibiotics.  She was volume resuscitated and has developed more shortness of breath and respiratory failure.  She started on BiPAP and PCCM consulted for respiratory failure and elevated lactic acid and transferred to SDU.  CODE STATUS discussed and patient decided to be DNR/DNI.   Acute on chronic respiratory failure in the setting of volume overload -Required BiPAP.  Discontinued IV fluids. -Echo showed EF of 55 to 60% with grade 2 diastolic dysfunction -Strict INO's and daily weight.   -PCCM consulted.  Started on DuoNebs and steroids. -Her symptoms improved.  She is off of BiPAP, on nasal cannula 5 L which is her baseline.  Okay to discharge from [facility] standpoint.  Eager to go home today   E. coli bacteremia E. coli UTI -Started on Rocephin.  Urine culture and susceptibilities are back.  Pansensitive.  She finished 3 days of IV Rocephin here in the hospital.  Will discharge on Cipro 500 mg twice a day for 7 days to complete 10 days of course.   Hypertension: -Resume home medications losartan and  amlodipine  -BP elevated likely due to her anxiety  acute on chronic diastolic CHF: Pulmonary hypertension: -Reviewed echo as above. -Strict INO's and daily weight.  IV fluids discontinued  Acute on chronic CKD stage IIIb: -Kidney function improved creatinine now at baseline   iron deficiency anemia: Continued iron supplements.    Anxiety: -With increasing anxiety.  BuSpar dose increased from 7.5 to 10 mg twice daily.  Follow-up with PCP   End stage COPD History of non-small cell lung cancer: -Undergoing radiation therapy.  On 6 to 7 L of oxygen at home at baseline.   GERD: Continue PPI   Type 2 diabetes: A1c is 6.8..  Continue sliding scale insulin   Leukocytosis: Likely due to steroids.  Remained afebrile  Thrombocytopenia: Chronic stable   Lactic acidosis: Resolved   Discharge Diagnoses:  Acute on chronic respiratory failure in the setting of volume overload Sepsis in the setting of E. coli bacteremia in the setting of E. coli UTI Hypertension Acute on chronic diastolic CHF Pulmonary hypertension Acute on chronic CKD stage IIIb Iron deficiency anemia Anxiety End-stage COPD History of non-small cell lung cancer GERD Type 2 diabetes Leukocytosis Thrombocytopenia Lactic acidosis Obesity with BMI of 29   Discharge Instructions  Discharge Instructions     Diet - low sodium heart healthy   Complete by: As directed    Increase activity slowly   Complete by: As directed       Allergies as of 08/15/2022   No Known Allergies      Medication List     STOP taking these medications  empagliflozin 10 MG Tabs tablet Commonly known as: JARDIANCE       TAKE these medications    acetaminophen 500 MG tablet Commonly known as: TYLENOL Take 1 tablet (500 mg total) by mouth every 6 (six) hours as needed. What changed:  how much to take when to take this   albuterol (2.5 MG/3ML) 0.083% nebulizer solution Commonly known as: PROVENTIL USE 1 VIAL IN  NEBULIZER EVERY 4 HOURS - And As Needed For Shortness of Breath What changed:  how much to take how to take this when to take this reasons to take this additional instructions   albuterol 108 (90 Base) MCG/ACT inhaler Commonly known as: VENTOLIN HFA Inhale 2 puffs into the lungs 2 (two) times daily as needed for wheezing or shortness of breath. What changed: See the new instructions.   amLODipine 5 MG tablet Commonly known as: NORVASC Take 5 mg by mouth daily.   aspirin EC 81 MG tablet Take 81 mg by mouth daily. Swallow whole.   atorvastatin 20 MG tablet Commonly known as: LIPITOR Take 20 mg by mouth at bedtime.   B-12 PO Take 1 tablet by mouth daily.   busPIRone 10 MG tablet Commonly known as: BUSPAR Take 1 tablet (10 mg total) by mouth 2 (two) times daily. What changed:  medication strength how much to take   cetirizine 10 MG tablet Commonly known as: ZYRTEC Take 10 mg by mouth at bedtime.   cholecalciferol 25 MCG (1000 UNIT) tablet Commonly known as: VITAMIN D3 Take 1,000 Units by mouth daily.   ciprofloxacin 500 MG tablet Commonly known as: Cipro Take 1 tablet (500 mg total) by mouth 2 (two) times daily for 7 days.   clopidogrel 75 MG tablet Commonly known as: PLAVIX Take 75 mg by mouth every morning.   ferrous sulfate 325 (65 FE) MG EC tablet Take 975 mg by mouth daily.   losartan 50 MG tablet Commonly known as: COZAAR Take 50 mg by mouth every morning.   methocarbamol 500 MG tablet Commonly known as: ROBAXIN Take 500 mg by mouth in the morning and at bedtime.   metoprolol succinate 25 MG 24 hr tablet Commonly known as: TOPROL-XL Take 25 mg by mouth daily.   nitroGLYCERIN 0.4 MG SL tablet Commonly known as: NITROSTAT Place 0.4 mg under the tongue every 5 (five) minutes as needed for chest pain.   nitroGLYCERIN 0.4 mg/hr patch Commonly known as: NITRODUR - Dosed in mg/24 hr Place 0.4 mg onto the skin every other day.   omeprazole 20 MG  capsule Commonly known as: PRILOSEC Take 20 mg by mouth daily.   OXYGEN Inhale 4-7 L into the lungs continuous.   pantoprazole 40 MG tablet Commonly known as: PROTONIX Take 30-60 min before first meal of the day   predniSONE 10 MG tablet Commonly known as: DELTASONE Start on 5/10 What changed:  how much to take how to take this when to take this reasons to take this additional instructions   REFRESH OP Place 1 drop into both eyes daily as needed (dry eyes).   Trelegy Ellipta 100-62.5-25 MCG/ACT Aepb Generic drug: Fluticasone-Umeclidin-Vilant Inhale 1 puff by mouth once daily   VITAMIN C PO Take 1 tablet by mouth daily.        No Known Allergies  Consultations: PCCM   Procedures/Studies: ECHOCARDIOGRAM COMPLETE  Result Date: 08/13/2022    ECHOCARDIOGRAM REPORT   Patient Name:   TAYANNA TALFORD Date of Exam: 08/13/2022 Medical Rec #:  [id number]  Height:       62.0 in Accession #:    [id number]      Weight:       160.1 lb Date of Birth:  1945/02/22      BSA:          1.739 m Patient Age:    76 years        BP:           175/87 mmHg Patient Gender: F               HR:           102 bpm. Exam Location:  Inpatient Procedure: 2D Echo, Cardiac Doppler and Color Doppler STAT ECHO Indications:    Acute respiratory distress  History:        Patient has prior history of Echocardiogram examinations. CHF,                 CAD, COPD, PAD and cancer, CKD, Signs/Symptoms:Shortness of                 Breath; Risk Factors:Hypertension, Sleep Apnea, Former Smoker                 and Diabetes. Sepsis, respiratory failure.  Sonographer:    Wallie Char Referring Phys: [id number] PETER E BABCOCK  Sonographer Comments: Messaged MD @ 5:13pm IMPRESSIONS  1. Left ventricular ejection fraction, by estimation, is 55 to 60%. The left ventricle has normal function. The left ventricle has no regional wall motion abnormalities. Left ventricular diastolic parameters are consistent with Grade II diastolic  dysfunction (pseudonormalization).  2. Right ventricular systolic function is normal. The right ventricular size is normal. There is moderately elevated pulmonary artery systolic pressure. The estimated right ventricular systolic pressure is 57.8 mmHg.  3. The mitral valve is normal in structure. No evidence of mitral valve regurgitation. No evidence of mitral stenosis.  4. The aortic valve is tricuspid. There is mild calcification of the aortic valve. Aortic valve regurgitation is mild.  5. The inferior vena cava is normal in size with greater than 50% respiratory variability, suggesting right atrial pressure of 3 mmHg. FINDINGS  Left Ventricle: Left ventricular ejection fraction, by estimation, is 55 to 60%. The left ventricle has normal function. The left ventricle has no regional wall motion abnormalities. The left ventricular internal cavity size was normal in size. There is  no left ventricular hypertrophy. Left ventricular diastolic parameters are consistent with Grade II diastolic dysfunction (pseudonormalization). Right Ventricle: The right ventricular size is normal. No increase in right ventricular wall thickness. Right ventricular systolic function is normal. There is moderately elevated pulmonary artery systolic pressure. The tricuspid regurgitant velocity is 3.70 m/s, and with an assumed right atrial pressure of 3 mmHg, the estimated right ventricular systolic pressure is 57.8 mmHg. Left Atrium: Left atrial size was normal in size. Right Atrium: Right atrial size was normal in size. Pericardium: There is no evidence of pericardial effusion. Mitral Valve: The mitral valve is normal in structure. No evidence of mitral valve regurgitation. No evidence of mitral valve stenosis. MV peak gradient, 4.2 mmHg. The mean mitral valve gradient is 2.0 mmHg. Tricuspid Valve: The tricuspid valve is normal in structure. Tricuspid valve regurgitation is mild. Aortic Valve: The aortic valve is tricuspid. There is mild  calcification of the aortic valve. Aortic valve regurgitation is mild. Aortic regurgitation PHT measures 344 msec. Aortic valve mean gradient measures 4.0 mmHg. Aortic valve peak gradient measures 6.9 mmHg. Aortic valve area,  by VTI measures 2.16 cm. Pulmonic Valve: The pulmonic valve was normal in structure. Pulmonic valve regurgitation is trivial. Aorta: The aortic root is normal in size and structure. Venous: The inferior vena cava is normal in size with greater than 50% respiratory variability, suggesting right atrial pressure of 3 mmHg. IAS/Shunts: No atrial level shunt detected by color flow Doppler.  LEFT VENTRICLE PLAX 2D LVIDd:         4.40 cm     Diastology LVIDs:         3.20 cm     LV e' medial:    4.56 cm/s LV PW:         1.00 cm     LV E/e' medial:  25.9 LV IVS:        0.70 cm     LV e' lateral:   8.12 cm/s LVOT diam:     1.90 cm     LV E/e' lateral: 14.5 LV SV:         51 LV SV Index:   30 LVOT Area:     2.84 cm  LV Volumes (MOD) LV vol d, MOD A2C: 59.2 ml LV vol d, MOD A4C: 69.6 ml LV vol s, MOD A2C: 27.8 ml LV vol s, MOD A4C: 31.1 ml LV SV MOD A2C:     31.4 ml LV SV MOD A4C:     69.6 ml LV SV MOD BP:      34.8 ml RIGHT VENTRICLE             IVC RV Basal diam:  3.00 cm     IVC diam: 1.40 cm RV S prime:     13.50 cm/s TAPSE (M-mode): 2.0 cm LEFT ATRIUM             Index        RIGHT ATRIUM           Index LA diam:        3.30 cm 1.90 cm/m   RA Area:     13.50 cm LA Vol (A2C):   22.1 ml 12.71 ml/m  RA Volume:   33.20 ml  19.09 ml/m LA Vol (A4C):   23.9 ml 13.74 ml/m LA Biplane Vol: 24.3 ml 13.97 ml/m  AORTIC VALVE AV Area (Vmax):    2.40 cm AV Area (Vmean):   2.11 cm AV Area (VTI):     2.16 cm AV Vmax:           131.50 cm/s AV Vmean:          89.450 cm/s AV VTI:            0.238 m AV Peak Grad:      6.9 mmHg AV Mean Grad:      4.0 mmHg LVOT Vmax:         111.50 cm/s LVOT Vmean:        66.650 cm/s LVOT VTI:          0.181 m LVOT/AV VTI ratio: 0.76 AI PHT:            344 msec AR Vena  Contracta: 0.30 cm  AORTA Ao Root diam: 3.30 cm Ao Asc diam:  2.90 cm MITRAL VALVE                TRICUSPID VALVE MV Area (PHT): 6.27 cm     TR Peak grad:   54.8 mmHg MV Area VTI:   3.62 cm     TR Vmax:  370.00 cm/s MV Peak grad:  4.2 mmHg MV Mean grad:  2.0 mmHg     SHUNTS MV Vmax:       1.03 m/s     Systemic VTI:  0.18 m MV Vmean:      59.4 cm/s    Systemic Diam: 1.90 cm MV Decel Time: 121 msec MV E velocity: 118.00 cm/s MV A velocity: 97.70 cm/s MV E/A ratio:  1.21 Dalton McleanMD Electronically signed by Wilfred Lacy Signature Date/Time: 08/13/2022/5:25:48 PM    Final    DG Chest Port 1 View  Result Date: 08/13/2022 CLINICAL DATA:  Acute on chronic respiratory failure EXAM: PORTABLE CHEST 1 VIEW COMPARISON:  Chest x-ray 08/12/2022.  Chest CT 08/03/2022. FINDINGS: There are postsurgical changes in the left hilar region which appears similar to the prior study with some perihilar opacities. There is no new focal lung infiltrate, pleural effusion or pneumothorax. The cardiac silhouette is stable. No acute fractures are seen. IMPRESSION: Postsurgical changes in the left hilar region. No new focal lung infiltrate, pleural effusion or pneumothorax. Electronically Signed   By: Darliss Cheney M.D.   On: 08/13/2022 17:25   CT CHEST ABDOMEN PELVIS WO CONTRAST  Result Date: 08/13/2022 CLINICAL DATA:  Concern for pneumonia on chest x-ray. Abdominal distension and nausea. Sepsis. EXAM: CT CHEST, ABDOMEN AND PELVIS WITHOUT CONTRAST TECHNIQUE: Multidetector CT imaging of the chest, abdomen and pelvis was performed following the standard protocol without IV contrast. RADIATION DOSE REDUCTION: This exam was performed according to the departmental dose-optimization program which includes automated exposure control, adjustment of the mA and/or kV according to patient size and/or use of iterative reconstruction technique. COMPARISON:  Abdomen pelvis CT 05/24/2022.  PET-CT 08/13/2022 FINDINGS: CT CHEST FINDINGS  Cardiovascular: The heart size is normal. No substantial pericardial effusion. Coronary artery calcification is evident. Moderate atherosclerotic calcification is noted in the wall of the thoracic aorta. Mediastinum/Nodes: No mediastinal lymphadenopathy. No evidence for gross hilar lymphadenopathy although assessment is limited by the lack of intravenous contrast on the current study. The esophagus has normal imaging features. There is no axillary lymphadenopathy. Lungs/Pleura: Centrilobular emphsyema noted. Abnormal soft tissue in the left hilum compatible with the patient's known treated lung cancer, similar in appearance to PET-CT 08/13/2022. 15 mm nodule in the posterior left upper lobe (75/6) is similar to prior. There is some minimal dependent atelectasis in the lung bases. No substantial pleural effusion. Musculoskeletal: No worrisome lytic or sclerotic osseous abnormality. Changes in the posterolateral left sixth rib are stable and may be treatment related. CT ABDOMEN PELVIS FINDINGS Hepatobiliary: No suspicious focal abnormality in the liver on this study without intravenous contrast. Gallbladder is surgically absent. No intrahepatic or extrahepatic biliary dilation. Pancreas: No focal mass lesion. No dilatation of the main duct. No intraparenchymal cyst. No peripancreatic edema. Spleen: No splenomegaly. No suspicious focal mass lesion. Adrenals/Urinary Tract: No adrenal nodule or mass. Similar appearance bilateral renal cysts better characterized on the previous abdomen/pelvis CT with contrast as Bosniak 1 and 2 lesions. Scattered vascular calcification noted in the hilum of each kidney, likely with superimposed tiny nonobstructing renal stones. The ill-defined area of hypoenhancement seen on the 05/24/2022 exam in the anterior left kidney is not readily evident on today's noncontrast study. No evidence for hydroureter. The urinary bladder appears normal for the degree of distention. Stomach/Bowel: Stomach  is unremarkable. No gastric wall thickening. No evidence of outlet obstruction. Duodenum is normally positioned as is the ligament of Treitz. No small bowel wall thickening. No small bowel  dilatation. The terminal ileum is normal. The appendix is normal. No gross colonic mass. No colonic wall thickening. Diverticular changes are noted in the left colon without evidence of diverticulitis. Vascular/Lymphatic: There is moderate atherosclerotic calcification of the abdominal aorta without aneurysm. There is no gastrohepatic or hepatoduodenal ligament lymphadenopathy. No retroperitoneal or mesenteric lymphadenopathy. No pelvic sidewall lymphadenopathy. Reproductive: Unremarkable. Other: No intraperitoneal free fluid. Musculoskeletal: No worrisome lytic or sclerotic osseous abnormality. Superior endplate compression deformity noted at T6, T12, L1, L2, L3, and L4, similar to prior. IMPRESSION: 1. No acute findings in the chest, abdomen, or pelvis. Specifically, no findings to explain the patient's history of sepsis. 2. Abnormal soft tissue in the left lung hilum compatible with the patient's known treated lung cancer, similar in appearance to PET-CT 08/13/2022. 3. 15 mm nodule in the posterior left upper lobe is similar to prior. This finding was described as concerning for recurrence on the previous PET-CT. 4. The ill-defined area of hypoenhancement in the left kidney seen on the 05/24/2022 exam is not readily evident on today's noncontrast study. 5. Left colonic diverticulosis without diverticulitis. 6. Aortic Atherosclerosis (ICD10-I70.0) and Emphysema (ICD10-J43.9). Electronically Signed   By: Kennith Center M.D.   On: 08/13/2022 06:28   DG Chest Port 1 View  Result Date: 08/12/2022 CLINICAL DATA:  Questionable sepsis - evaluate for abnormality, left lung cancer EXAM: PORTABLE CHEST 1 VIEW COMPARISON:  Chest x-ray 06/09/2022, PET CT 05/20/2022 FINDINGS: The heart and mediastinal contours are unchanged. Aortic  calcification. Persistently prominent bilateral hilar regions, left greater than right. Left hilar surgical changes. Question development of retrocardiac airspace opacity. No pulmonary edema. No pleural effusion. No pneumothorax. No acute osseous abnormality. IMPRESSION: 1. Question development of retrocardiac airspace opacity. 2. Persistently prominent bilateral hilar regions, left greater than right. 3. Aortic Atherosclerosis (ICD10-I70.0). Electronically Signed   By: Tish Frederickson M.D.   On: 08/12/2022 22:59      Subjective: Patient seen and examined.  Reports overall she is feeling better and eager to go home today.  Remained afebrile.  No acute events overnight.  Discharge Exam: Vitals:   08/15/22 0900 08/15/22 0908  BP: (!) 147/59 (!) 160/69  Pulse: (!) 110 (!) 108  Resp: 16 20  Temp:    SpO2: 94% 96%   Vitals:   08/15/22 0800 08/15/22 0840 08/15/22 0900 08/15/22 0908  BP: (!) 164/58  (!) 147/59 (!) 160/69  Pulse: 94  (!) 110 (!) 108  Resp: (!) 25  16 20   Temp:  (!) 97.5 F (36.4 C)    TempSrc:  Axillary    SpO2: 96%  94% 96%  Weight:      Height:        General: Pt is alert, awake, not in acute distress, on nasal cannula, communicating well Cardiovascular: RRR, S1/S2 +, no rubs, no gallops Respiratory: CTA bilaterally, no wheezing, no rhonchi Abdominal: Soft, NT, ND, bowel sounds + Extremities: no edema, no cyanosis    The results of significant diagnostics from this hospitalization (including imaging, microbiology, ancillary and laboratory) are listed below for reference.     Microbiology: Recent Results (from the past 240 hour(s))  Blood Culture (routine x 2)     Status: Abnormal   Collection Time: 08/12/22 10:40 PM   Specimen: BLOOD LEFT FOREARM  Result Value Ref Range Status   Specimen Description   Final    BLOOD LEFT FOREARM Performed at [facility], [address] [place], [place] [address]    Special Requests  Final    BOTTLES DRAWN AEROBIC  ONLY Blood Culture results may not be optimal due to an excessive volume of blood received in culture bottles Performed at [facility], [address], [place], [place] [address]    Culture  Setup Time   Final    GRAM NEGATIVE RODS BOTTLES DRAWN AEROBIC ONLY CRITICAL RESULT CALLED TO, READ BACK BY AND VERIFIED WITH: PHARMD C. SHADE [id number] @1939  FH Performed at [facility], [address] [place], [place] [address]    Culture ESCHERICHIA COLI (A)  Final   Report Status 08/15/2022 FINAL  Final   Organism ID, Bacteria ESCHERICHIA COLI  Final      Susceptibility   Escherichia coli - MIC*    AMPICILLIN 8 SENSITIVE Sensitive     CEFEPIME <=0.12 SENSITIVE Sensitive     CEFTAZIDIME <=1 SENSITIVE Sensitive     CEFTRIAXONE <=0.25 SENSITIVE Sensitive     CIPROFLOXACIN <=0.25 SENSITIVE Sensitive     GENTAMICIN <=1 SENSITIVE Sensitive     IMIPENEM <=0.25 SENSITIVE Sensitive     TRIMETH/SULFA <=20 SENSITIVE Sensitive     AMPICILLIN/SULBACTAM 4 SENSITIVE Sensitive     PIP/TAZO <=4 SENSITIVE Sensitive     * ESCHERICHIA COLI  Blood Culture ID Panel (Reflexed)     Status: Abnormal   Collection Time: 08/12/22 10:40 PM  Result Value Ref Range Status   Enterococcus faecalis NOT DETECTED NOT DETECTED Final   Enterococcus Faecium NOT DETECTED NOT DETECTED Final   Listeria monocytogenes NOT DETECTED NOT DETECTED Final   Staphylococcus species NOT DETECTED NOT DETECTED Final   Staphylococcus aureus (BCID) NOT DETECTED NOT DETECTED Final   Staphylococcus epidermidis NOT DETECTED NOT DETECTED Final   Staphylococcus lugdunensis NOT DETECTED NOT DETECTED Final   Streptococcus species NOT DETECTED NOT DETECTED Final   Streptococcus agalactiae NOT DETECTED NOT DETECTED Final   Streptococcus pneumoniae NOT DETECTED NOT DETECTED Final   Streptococcus pyogenes NOT DETECTED NOT DETECTED Final   A.calcoaceticus-baumannii NOT DETECTED NOT DETECTED Final   Bacteroides fragilis NOT  DETECTED NOT DETECTED Final   Enterobacterales DETECTED (A) NOT DETECTED Final    Comment: Enterobacterales represent a large order of gram negative bacteria, not a single organism. CRITICAL RESULT CALLED TO, READ BACK BY AND VERIFIED WITH: PHARMD C. SHADE [id number] @1939  FH    Enterobacter cloacae complex NOT DETECTED NOT DETECTED Final   Escherichia coli DETECTED (A) NOT DETECTED Final    Comment: CRITICAL RESULT CALLED TO, READ BACK BY AND VERIFIED WITH: PHARMD C. SHADE [id number] @1939  FH    Klebsiella aerogenes NOT DETECTED NOT DETECTED Final   Klebsiella oxytoca NOT DETECTED NOT DETECTED Final   Klebsiella pneumoniae NOT DETECTED NOT DETECTED Final   Proteus species NOT DETECTED NOT DETECTED Final   Salmonella species NOT DETECTED NOT DETECTED Final   Serratia marcescens NOT DETECTED NOT DETECTED Final   Haemophilus influenzae NOT DETECTED NOT DETECTED Final   Neisseria meningitidis NOT DETECTED NOT DETECTED Final   Pseudomonas aeruginosa NOT DETECTED NOT DETECTED Final   Stenotrophomonas maltophilia NOT DETECTED NOT DETECTED Final   Candida albicans NOT DETECTED NOT DETECTED Final   Candida auris NOT DETECTED NOT DETECTED Final   Candida glabrata NOT DETECTED NOT DETECTED Final   Candida krusei NOT DETECTED NOT DETECTED Final   Candida parapsilosis NOT DETECTED NOT DETECTED Final   Candida tropicalis NOT DETECTED NOT DETECTED Final   Cryptococcus neoformans/gattii NOT DETECTED NOT DETECTED Final   CTX-M ESBL NOT DETECTED NOT DETECTED Final  Carbapenem resistance IMP NOT DETECTED NOT DETECTED Final   Carbapenem resistance KPC NOT DETECTED NOT DETECTED Final   Carbapenem resistance NDM NOT DETECTED NOT DETECTED Final   Carbapenem resist OXA 48 LIKE NOT DETECTED NOT DETECTED Final   Carbapenem resistance VIM NOT DETECTED NOT DETECTED Final    Comment: Performed at [facility], [address] [place], [place] [address]  Blood Culture (routine x 2)     Status: None (Preliminary  result)   Collection Time: 08/12/22 10:42 PM   Specimen: BLOOD  Result Value Ref Range Status   Specimen Description   Final    BLOOD BLOOD LEFT ARM Performed at [facility], [address] [place], [place] [address]    Special Requests   Final    BOTTLES DRAWN AEROBIC AND ANAEROBIC Blood Culture results may not be optimal due to an inadequate volume of blood received in culture bottles Performed at [facility], [address] [place], [place] [address]    Culture   Final    NO GROWTH 3 DAYS Performed at [facility], [address] [place], [place] [address]    Report Status PENDING  Incomplete  Urine Culture (for pregnant, neutropenic or urologic patients or patients with an indwelling urinary catheter)     Status: Abnormal   Collection Time: 08/12/22 11:22 PM   Specimen: Urine, Clean Catch  Result Value Ref Range Status   Specimen Description   Final    URINE, CLEAN CATCH Performed at [facility], [address] [place], [place] [address]    Special Requests   Final    NONE Performed at [facility], [address] [place], [place] [address]    Culture >=100,000 COLONIES/mL ESCHERICHIA COLI (A)  Final   Report Status 08/15/2022 FINAL  Final   Organism ID, Bacteria ESCHERICHIA COLI (A)  Final      Susceptibility   Escherichia coli - MIC*    AMPICILLIN 8 SENSITIVE Sensitive     CEFAZOLIN <=4 SENSITIVE Sensitive     CEFEPIME <=0.12 SENSITIVE Sensitive     CEFTRIAXONE <=0.25 SENSITIVE Sensitive     CIPROFLOXACIN <=0.25 SENSITIVE Sensitive     GENTAMICIN <=1 SENSITIVE Sensitive     IMIPENEM <=0.25 SENSITIVE Sensitive     NITROFURANTOIN <=16 SENSITIVE Sensitive     TRIMETH/SULFA <=20 SENSITIVE Sensitive     AMPICILLIN/SULBACTAM 4 SENSITIVE Sensitive     PIP/TAZO <=4 SENSITIVE Sensitive     * >=100,000 COLONIES/mL ESCHERICHIA COLI  SARS Coronavirus 2 by RT PCR (hospital order, performed  in [facility] hospital lab) *cepheid single result test* Anterior Nasal Swab     Status: None   Collection Time: 08/12/22 11:55 PM   Specimen: Anterior Nasal Swab  Result Value Ref Range Status   SARS Coronavirus 2 by RT PCR NEGATIVE NEGATIVE Final    Comment: (NOTE) SARS-CoV-2 target nucleic acids are NOT DETECTED.  The SARS-CoV-2 RNA is generally detectable in upper and lower respiratory specimens during the acute phase of infection. The lowest concentration of SARS-CoV-2 viral copies this assay can detect is 250 copies / mL. A negative result does not preclude SARS-CoV-2 infection and should not be used as the sole basis for treatment or other patient management decisions.  A negative result may occur with improper specimen collection / handling, submission of specimen other than nasopharyngeal swab, presence of viral mutation(s) within the areas targeted by this assay, and inadequate number  of viral copies (<250 copies / mL). A negative result must be combined with clinical observations, patient history, and epidemiological information.  Fact Sheet for Patients:   RoadLapTop.co.za  Fact Sheet for Healthcare Providers: http://kim-miller.com/  This test is not yet approved or  cleared by the [place] FDA and has been authorized for detection and/or diagnosis of SARS-CoV-2 by FDA under an Emergency Use Authorization (EUA).  This EUA will remain in effect (meaning this test can be used) for the duration of the COVID-19 declaration under Section 564(b)(1) of the Act, 21 U.S.C. section 360bbb-3(b)(1), unless the authorization is terminated or revoked sooner.  Performed at [facility], [address] [place], [place] [address]   MRSA Next Gen by PCR, Nasal     Status: None   Collection Time: 08/13/22  3:52 PM   Specimen: Nasal Mucosa; Nasal Swab  Result Value Ref Range Status   MRSA by PCR Next Gen NOT  DETECTED NOT DETECTED Final    Comment: (NOTE) The GeneXpert MRSA Assay (FDA approved for NASAL specimens only), is one component of a comprehensive MRSA colonization surveillance program. It is not intended to diagnose MRSA infection nor to guide or monitor treatment for MRSA infections. Test performance is not FDA approved in patients less than 35 years old. Performed at [facility], [address], [place], [place] [address]      Labs: BNP (last 3 results) Recent Labs    05/24/22 1340 08/13/22 1629  BNP 62.2 100.4*   Basic Metabolic Panel: Recent Labs  Lab 08/12/22 2242 08/13/22 1629 08/14/22 0254 08/15/22 0256  NA 139 142 141 142  K 3.7 4.3 4.2 4.5  CL 103 110 110 109  CO2 22 21* 24 25  GLUCOSE 233* 234* 164* 182*  BUN 25* 28* 30* 40*  CREATININE 1.24* 1.14* 1.07* 1.04*  CALCIUM 9.3 8.5* 8.7* 9.0  MG  --   --   --  2.5*   Liver Function Tests: Recent Labs  Lab 08/12/22 2242 08/13/22 1629 08/14/22 0254  AST 22 22 21   ALT 23 26 23   ALKPHOS 47 40 39  BILITOT 1.1 0.5 0.7  PROT 7.6 7.3 7.0  ALBUMIN 4.1 3.7 3.5   No results for input(s): "LIPASE", "AMYLASE" in the last 168 hours. No results for input(s): "AMMONIA" in the last 168 hours. CBC: Recent Labs  Lab 08/12/22 2242 08/14/22 0254 08/15/22 0256  WBC 9.3 13.8* 13.1*  NEUTROABS 7.0  --   --   HGB 11.3* 10.0* 10.1*  HCT 37.7 34.0* 33.5*  MCV 104.1* 104.3* 103.4*  PLT 121* 117* 128*   Cardiac Enzymes: No results for input(s): "CKTOTAL", "CKMB", "CKMBINDEX", "TROPONINI" in the last 168 hours. BNP: Invalid input(s): "POCBNP" CBG: Recent Labs  Lab 08/14/22 0833 08/14/22 1217 08/14/22 1639 08/14/22 2104 08/15/22 0837  GLUCAP 141* 202* 137* 165* 197*   D-Dimer No results for input(s): "DDIMER" in the last 72 hours. Hgb A1c Recent Labs    08/13/22 0841  HGBA1C 6.8*   Lipid Profile No results for input(s): "CHOL", "HDL", "LDLCALC", "TRIG", "CHOLHDL", "LDLDIRECT" in  the last 72 hours. Thyroid function studies No results for input(s): "TSH", "T4TOTAL", "T3FREE", "THYROIDAB" in the last 72 hours.  Invalid input(s): "FREET3" Anemia work up No results for input(s): "VITAMINB12", "FOLATE", "FERRITIN", "TIBC", "IRON", "RETICCTPCT" in the last 72 hours. Urinalysis    Component Value Date/Time   COLORURINE YELLOW 08/12/2022 2322   APPEARANCEUR HAZY (A) 08/12/2022 2322   LABSPEC 1.021  08/12/2022 2322   PHURINE 5.0 08/12/2022 2322   GLUCOSEU >=500 (A) 08/12/2022 2322   HGBUR MODERATE (A) 08/12/2022 2322   BILIRUBINUR NEGATIVE 08/12/2022 2322   KETONESUR 5 (A) 08/12/2022 2322   PROTEINUR 30 (A) 08/12/2022 2322   UROBILINOGEN 0.2 09/02/2014 2138   NITRITE POSITIVE (A) 08/12/2022 2322   LEUKOCYTESUR SMALL (A) 08/12/2022 2322   Sepsis Labs Recent Labs  Lab 08/12/22 2242 08/14/22 0254 08/15/22 0256  WBC 9.3 13.8* 13.1*   Microbiology Recent Results (from the past 240 hour(s))  Blood Culture (routine x 2)     Status: Abnormal   Collection Time: 08/12/22 10:40 PM   Specimen: BLOOD LEFT FOREARM  Result Value Ref Range Status   Specimen Description   Final    BLOOD LEFT FOREARM Performed at [facility], [address] [place], [place] [address]    Special Requests   Final    BOTTLES DRAWN AEROBIC ONLY Blood Culture results may not be optimal due to an excessive volume of blood received in culture bottles Performed at [facility], [address], [place], [place] [address]    Culture  Setup Time   Final    GRAM NEGATIVE RODS BOTTLES DRAWN AEROBIC ONLY CRITICAL RESULT CALLED TO, READ BACK BY AND VERIFIED WITH: PHARMD C. Clearance Coots [id number] @1939  FH Performed at [facility], [address] [place], [place] [address]    Culture ESCHERICHIA COLI (A)  Final   Report Status 08/15/2022 FINAL  Final   Organism ID, Bacteria ESCHERICHIA COLI  Final      Susceptibility   Escherichia coli - MIC*    AMPICILLIN 8 SENSITIVE  Sensitive     CEFEPIME <=0.12 SENSITIVE Sensitive     CEFTAZIDIME <=1 SENSITIVE Sensitive     CEFTRIAXONE <=0.25 SENSITIVE Sensitive     CIPROFLOXACIN <=0.25 SENSITIVE Sensitive     GENTAMICIN <=1 SENSITIVE Sensitive     IMIPENEM <=0.25 SENSITIVE Sensitive     TRIMETH/SULFA <=20 SENSITIVE Sensitive     AMPICILLIN/SULBACTAM 4 SENSITIVE Sensitive     PIP/TAZO <=4 SENSITIVE Sensitive     * ESCHERICHIA COLI  Blood Culture ID Panel (Reflexed)     Status: Abnormal   Collection Time: 08/12/22 10:40 PM  Result Value Ref Range Status   Enterococcus faecalis NOT DETECTED NOT DETECTED Final   Enterococcus Faecium NOT DETECTED NOT DETECTED Final   Listeria monocytogenes NOT DETECTED NOT DETECTED Final   Staphylococcus species NOT DETECTED NOT DETECTED Final   Staphylococcus aureus (BCID) NOT DETECTED NOT DETECTED Final   Staphylococcus epidermidis NOT DETECTED NOT DETECTED Final   Staphylococcus lugdunensis NOT DETECTED NOT DETECTED Final   Streptococcus species NOT DETECTED NOT DETECTED Final   Streptococcus agalactiae NOT DETECTED NOT DETECTED Final   Streptococcus pneumoniae NOT DETECTED NOT DETECTED Final   Streptococcus pyogenes NOT DETECTED NOT DETECTED Final   A.calcoaceticus-baumannii NOT DETECTED NOT DETECTED Final   Bacteroides fragilis NOT DETECTED NOT DETECTED Final   Enterobacterales DETECTED (A) NOT DETECTED Final    Comment: Enterobacterales represent a large order of gram negative bacteria, not a single organism. CRITICAL RESULT CALLED TO, READ BACK BY AND VERIFIED WITH: PHARMD C. SHADE [id number] @1939  FH    Enterobacter cloacae complex NOT DETECTED NOT DETECTED Final   Escherichia coli DETECTED (A) NOT DETECTED Final    Comment: CRITICAL RESULT CALLED TO, READ BACK BY AND VERIFIED WITH: PHARMD C. SHADE [id number] @1939  FH    Klebsiella aerogenes NOT DETECTED NOT DETECTED  Final   Klebsiella oxytoca NOT DETECTED NOT DETECTED Final   Klebsiella pneumoniae NOT DETECTED NOT DETECTED  Final   Proteus species NOT DETECTED NOT DETECTED Final   Salmonella species NOT DETECTED NOT DETECTED Final   Serratia marcescens NOT DETECTED NOT DETECTED Final   Haemophilus influenzae NOT DETECTED NOT DETECTED Final   Neisseria meningitidis NOT DETECTED NOT DETECTED Final   Pseudomonas aeruginosa NOT DETECTED NOT DETECTED Final   Stenotrophomonas maltophilia NOT DETECTED NOT DETECTED Final   Candida albicans NOT DETECTED NOT DETECTED Final   Candida auris NOT DETECTED NOT DETECTED Final   Candida glabrata NOT DETECTED NOT DETECTED Final   Candida krusei NOT DETECTED NOT DETECTED Final   Candida parapsilosis NOT DETECTED NOT DETECTED Final   Candida tropicalis NOT DETECTED NOT DETECTED Final   Cryptococcus neoformans/gattii NOT DETECTED NOT DETECTED Final   CTX-M ESBL NOT DETECTED NOT DETECTED Final   Carbapenem resistance IMP NOT DETECTED NOT DETECTED Final   Carbapenem resistance KPC NOT DETECTED NOT DETECTED Final   Carbapenem resistance NDM NOT DETECTED NOT DETECTED Final   Carbapenem resist OXA 48 LIKE NOT DETECTED NOT DETECTED Final   Carbapenem resistance VIM NOT DETECTED NOT DETECTED Final    Comment: Performed at [facility], [address] [place], [place] [address]  Blood Culture (routine x 2)     Status: None (Preliminary result)   Collection Time: 08/12/22 10:42 PM   Specimen: BLOOD  Result Value Ref Range Status   Specimen Description   Final    BLOOD BLOOD LEFT ARM Performed at [facility], [address] [place], [place] [address]    Special Requests   Final    BOTTLES DRAWN AEROBIC AND ANAEROBIC Blood Culture results may not be optimal due to an inadequate volume of blood received in culture bottles Performed at [facility], [address], [place], [place] [address]    Culture   Final    NO GROWTH 3 DAYS Performed at [facility], [address], [place], [place] [address]    Report Status PENDING   Incomplete  Urine Culture (for pregnant, neutropenic or urologic patients or patients with an indwelling urinary catheter)     Status: Abnormal   Collection Time: 08/12/22 11:22 PM   Specimen: Urine, Clean Catch  Result Value Ref Range Status   Specimen Description   Final    URINE, CLEAN CATCH Performed at [facility], [address] [place], [place] [address]    Special Requests   Final    NONE Performed at [facility], [address] [place], [place] [address]    Culture >=100,000 COLONIES/mL ESCHERICHIA COLI (A)  Final   Report Status 08/15/2022 FINAL  Final   Organism ID, Bacteria ESCHERICHIA COLI (A)  Final      Susceptibility   Escherichia coli - MIC*    AMPICILLIN 8 SENSITIVE Sensitive     CEFAZOLIN <=4 SENSITIVE Sensitive     CEFEPIME <=0.12 SENSITIVE Sensitive     CEFTRIAXONE <=0.25 SENSITIVE Sensitive     CIPROFLOXACIN <=0.25 SENSITIVE Sensitive     GENTAMICIN <=1 SENSITIVE Sensitive     IMIPENEM <=0.25 SENSITIVE Sensitive     NITROFURANTOIN <=16 SENSITIVE Sensitive     TRIMETH/SULFA <=20 SENSITIVE Sensitive     AMPICILLIN/SULBACTAM 4 SENSITIVE Sensitive     PIP/TAZO <=4 SENSITIVE Sensitive     * >=100,000 COLONIES/mL ESCHERICHIA COLI  SARS Coronavirus 2 by RT  PCR (hospital order, performed in [facility] hospital lab) *cepheid single result test* Anterior Nasal Swab     Status: None   Collection Time: 08/12/22 11:55 PM   Specimen: Anterior Nasal Swab  Result Value Ref Range Status   SARS Coronavirus 2 by RT PCR NEGATIVE NEGATIVE Final    Comment: (NOTE) SARS-CoV-2 target nucleic acids are NOT DETECTED.  The SARS-CoV-2 RNA is generally detectable in upper and lower respiratory specimens during the acute phase of infection. The lowest concentration of SARS-CoV-2 viral copies this assay can detect is 250 copies / mL. A negative result does not preclude SARS-CoV-2 infection and should not be used as the sole basis for  treatment or other patient management decisions.  A negative result may occur with improper specimen collection / handling, submission of specimen other than nasopharyngeal swab, presence of viral mutation(s) within the areas targeted by this assay, and inadequate number of viral copies (<250 copies / mL). A negative result must be combined with clinical observations, patient history, and epidemiological information.  Fact Sheet for Patients:   RoadLapTop.co.za  Fact Sheet for Healthcare Providers: http://kim-miller.com/  This test is not yet approved or  cleared by the [place] FDA and has been authorized for detection and/or diagnosis of SARS-CoV-2 by FDA under an Emergency Use Authorization (EUA).  This EUA will remain in effect (meaning this test can be used) for the duration of the COVID-19 declaration under Section 564(b)(1) of the Act, 21 U.S.C. section 360bbb-3(b)(1), unless the authorization is terminated or revoked sooner.  Performed at [facility], [address] [place], [place] [address]   MRSA Next Gen by PCR, Nasal     Status: None   Collection Time: 08/13/22  3:52 PM   Specimen: Nasal Mucosa; Nasal Swab  Result Value Ref Range Status   MRSA by PCR Next Gen NOT DETECTED NOT DETECTED Final    Comment: (NOTE) The GeneXpert MRSA Assay (FDA approved for NASAL specimens only), is one component of a comprehensive MRSA colonization surveillance program. It is not intended to diagnose MRSA infection nor to guide or monitor treatment for MRSA infections. Test performance is not FDA approved in patients less than 44 years old. Performed at [facility], [address], [place], [place] [address]      Time coordinating discharge: Over 30 minutes  SIGNED:   Ollen Bowl, MD  [facility] 08/15/2022, 11:11 AM Pager   If 7PM-7AM, please contact  night-coverage [email or web address]

## 2022-08-16 ENCOUNTER — Telehealth: Payer: Self-pay

## 2022-08-16 ENCOUNTER — Ambulatory Visit: Payer: Medicare PPO

## 2022-08-16 NOTE — Telephone Encounter (Signed)
Rn called pt and she was not feeling well enough to come to treatment today after recent d/c from the hospital. Rn called L1 who placed her final treatment for 9:30 tomorrow morning.

## 2022-08-17 ENCOUNTER — Other Ambulatory Visit: Payer: Self-pay

## 2022-08-17 ENCOUNTER — Ambulatory Visit
Admission: RE | Admit: 2022-08-17 | Discharge: 2022-08-17 | Disposition: A | Discharge status: Home / Self Care | Payer: Medicare PPO | Source: Ambulatory Visit | Attending: Radiation Oncology | Admitting: Radiation Oncology

## 2022-08-17 DIAGNOSIS — C3412 Malignant neoplasm of upper lobe, left bronchus or lung: Secondary | ICD-10-CM | POA: Insufficient documentation

## 2022-08-17 DIAGNOSIS — Z510 Encounter for antineoplastic radiation therapy: Secondary | ICD-10-CM | POA: Diagnosis not present

## 2022-08-17 DIAGNOSIS — F17210 Nicotine dependence, cigarettes, uncomplicated: Secondary | ICD-10-CM | POA: Diagnosis not present

## 2022-08-17 LAB — RAD ONC ARIA SESSION SUMMARY
Course Elapsed Days: 15
Plan Fractions Treated to Date: 10
Plan Prescribed Dose Per Fraction: 5 Gy
Plan Total Fractions Prescribed: 10
Plan Total Prescribed Dose: 50 Gy
Reference Point Dosage Given to Date: 50 Gy
Reference Point Session Dosage Given: 5 Gy
Session Number: 10

## 2022-08-18 NOTE — Radiation Completion Notes (Signed)
Patient Name: Suzanne Dickson, Suzanne Dickson MRN: [id number] Date of Birth: 1945/12/29 Referring Physician: Si Gaul, M.D. Date of Service: 2022-08-18 Radiation Oncologist: Arnette Schaumann, M.D. 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

## 2022-08-19 DIAGNOSIS — I10 Essential (primary) hypertension: Secondary | ICD-10-CM | POA: Diagnosis not present

## 2022-08-19 DIAGNOSIS — J42 Unspecified chronic bronchitis: Secondary | ICD-10-CM | POA: Diagnosis not present

## 2022-08-24 ENCOUNTER — Other Ambulatory Visit: Payer: Self-pay | Admitting: Internal Medicine

## 2022-08-27 DIAGNOSIS — J9621 Acute and chronic respiratory failure with hypoxia: Secondary | ICD-10-CM | POA: Diagnosis not present

## 2022-08-27 DIAGNOSIS — I10 Essential (primary) hypertension: Secondary | ICD-10-CM | POA: Diagnosis not present

## 2022-08-27 DIAGNOSIS — N390 Urinary tract infection, site not specified: Secondary | ICD-10-CM | POA: Diagnosis not present

## 2022-08-27 DIAGNOSIS — R8290 Unspecified abnormal findings in urine: Secondary | ICD-10-CM | POA: Diagnosis not present

## 2022-08-27 DIAGNOSIS — J9622 Acute and chronic respiratory failure with hypercapnia: Secondary | ICD-10-CM | POA: Diagnosis not present

## 2022-08-27 DIAGNOSIS — C3492 Malignant neoplasm of unspecified part of left bronchus or lung: Secondary | ICD-10-CM | POA: Diagnosis not present

## 2022-08-27 DIAGNOSIS — A419 Sepsis, unspecified organism: Secondary | ICD-10-CM | POA: Diagnosis not present

## 2022-08-27 DIAGNOSIS — K5901 Slow transit constipation: Secondary | ICD-10-CM | POA: Diagnosis not present

## 2022-09-15 ENCOUNTER — Encounter: Payer: Self-pay | Admitting: Radiation Oncology

## 2022-09-15 NOTE — Progress Notes (Signed)
Radiation Oncology         [phone] ________________________________  Name: Suzanne Dickson MRN: [id number]  Date: 09/16/2022  DOB: 1945/05/05  Follow-Up Visit Note  CC: Dot Been, FNP  Si Gaul, MD  No diagnosis found.  Diagnosis: The primary encounter diagnosis was Malignant neoplasm of upper lobe bronchus, left (HCC). A diagnosis of Adenocarcinoma of left lung, stage 1 (HCC) was also pertinent to this visit.   Adenocarcinoma of the left lung diagnosed in 2018 - now with an isolated recurrence in the left lung in March 2024 (non-biopsy proven)   Cancer Staging  Adenocarcinoma of left lung, stage 1 (HCC) Staging form: Lung, AJCC 8th Edition - Clinical: Stage IA1 (cT1a, cN0, cM0) - Signed by Si Gaul, MD on 03/17/2020  Interval Since Last Radiation: almost 1 month   Indication for treatment: Curative       Radiation treatment dates: 08/02/22 through 08/17/22  Site/dose: Left lung - 50 Gy delivered in 10 Fx at 5 Gy/Fx Beams/energy:  6X-FFF Technique/Mode: IMRT / photon  Narrative:  The patient returns today for routine follow-up. The patient tolerated radiation treatment relatively well. During her final weekly treatment check on 08/10/22, the patient endorsed fatigue and a cough. She denied any other side effects or concerns related to treatment.  Shortly before completing radiation therapy, the patient was hospitalized from 08/12/22 through 08/15/22 for management of sepsis secondary to UTI and acute on chronic respiratory failure after presenting with shortness of breath. She was started on antibiotics however she began to have progressive SOB and developed respiratory failure.  She was subsequently put on BiPAP and transferred to the SDU. PCCM was consulted and she was started on DuoNebs and steroids with improvement. Prior to discharge, she was able to transition to a nasal cannula at 5L which is her baseline. As for her UTI, she completed 3 days of IV  rocephin and discharged on a 7 day course of cipro. Imaging performed while inpatient includes:  -- CT CAP on 08/13/22 demonstrated: similar appearing abnormal soft tissue in the left lung hilum compatible with the patient's treated lung cancer, and stability of the previously seen 15 mm nodule in the posterior left upper lobe.  CT otherwise showed no acute findings in the chest, abdomen, or pelvis to correlate with the patient's history of sepsis.        No other significant oncologic interval history since the patient completed radiation therapy or since the patient was seen in consultation on 07/12/22.    ***                   Allergies:  has No Known Allergies.  Meds: Current Outpatient Medications  Medication Sig Dispense Refill   acetaminophen (TYLENOL) 500 MG tablet Take 1 tablet (500 mg total) by mouth every 6 (six) hours as needed. (Patient taking differently: Take 1,000 mg by mouth in the morning and at bedtime.) 30 tablet 0   albuterol (PROVENTIL) (2.5 MG/3ML) 0.083% nebulizer solution USE 1 VIAL IN NEBULIZER EVERY 4 HOURS - And As Needed For Shortness of Breath (Patient taking differently: Take 2.5 mg by nebulization as needed for wheezing or shortness of breath.) 60 mL 11   albuterol (VENTOLIN HFA) 108 (90 Base) MCG/ACT inhaler Inhale 2 puffs into the lungs 2 (two) times daily as needed for wheezing or shortness of breath. 17 each 0   amLODipine (NORVASC) 5 MG tablet Take 5 mg by mouth daily.     Ascorbic Acid (  VITAMIN C PO) Take 1 tablet by mouth daily.     aspirin EC 81 MG tablet Take 81 mg by mouth daily. Swallow whole.     atorvastatin (LIPITOR) 20 MG tablet Take 20 mg by mouth at bedtime.     cetirizine (ZYRTEC) 10 MG tablet Take 10 mg by mouth at bedtime.     cholecalciferol (VITAMIN D3) 25 MCG (1000 UNIT) tablet Take 1,000 Units by mouth daily.     clopidogrel (PLAVIX) 75 MG tablet Take 75 mg by mouth every morning.      Cyanocobalamin (B-12 PO) Take 1 tablet by mouth  daily.     ferrous sulfate 325 (65 FE) MG EC tablet Take 975 mg by mouth daily.     Fluticasone-Umeclidin-Vilant (TRELEGY ELLIPTA) 100-62.5-25 MCG/ACT AEPB Inhale 1 puff by mouth once daily 60 each 11   losartan (COZAAR) 50 MG tablet Take 50 mg by mouth every morning.     methocarbamol (ROBAXIN) 500 MG tablet Take 500 mg by mouth in the morning and at bedtime.     metoprolol succinate (TOPROL-XL) 25 MG 24 hr tablet Take 25 mg by mouth daily.     nitroGLYCERIN (NITRODUR - DOSED IN MG/24 HR) 0.4 mg/hr patch Place 0.4 mg onto the skin every other day.  3   nitroGLYCERIN (NITROSTAT) 0.4 MG SL tablet Place 0.4 mg under the tongue every 5 (five) minutes as needed for chest pain.     omeprazole (PRILOSEC) 20 MG capsule Take 20 mg by mouth daily.     OXYGEN Inhale 4-7 L into the lungs continuous.     pantoprazole (PROTONIX) 40 MG tablet Take 30-60 min before first meal of the day (Patient not taking: Reported on 08/13/2022) 90 tablet 3   Polyvinyl Alcohol-Povidone (REFRESH OP) Place 1 drop into both eyes daily as needed (dry eyes).     predniSONE (DELTASONE) 10 MG tablet Take 1 tablet (10 mg total) by mouth daily as needed. 100 tablet 0   No current facility-administered medications for this encounter.    Physical Findings: The patient is in no acute distress. Patient is alert and oriented.  vitals were not taken for this visit. .  No significant changes. Lungs are clear to auscultation bilaterally. Heart has regular rate and rhythm. No palpable cervical, supraclavicular, or axillary adenopathy. Abdomen soft, non-tender, normal bowel sounds.   Lab Findings: Lab Results  Component Value Date   WBC 13.1 (H) 08/15/2022   HGB 10.1 (L) 08/15/2022   HCT 33.5 (L) 08/15/2022   MCV 103.4 (H) 08/15/2022   PLT 128 (L) 08/15/2022    Radiographic Findings: No results found.  Impression: The primary encounter diagnosis was Malignant neoplasm of upper lobe bronchus, left (HCC). A diagnosis of  Adenocarcinoma of left lung, stage 1 (HCC) was also pertinent to this visit.   Adenocarcinoma of the left lung diagnosed in 2018 - now with an isolated recurrence in the left lung in March 2024 (non-biopsy proven)  The patient is recovering from the effects of radiation.  ***  Plan:  ***   *** minutes of total time was spent for this patient encounter, including preparation, face-to-face counseling with the patient and coordination of care, physical exam, and documentation of the encounter. ____________________________________  Billie Lade, PhD, MD  This document serves as a record of services personally performed by Antony Blackbird, MD. It was created on his behalf by Neena Rhymes, a trained medical scribe. The creation of this record is based on the scribe's  personal observations and the provider's statements to them. This document has been checked and approved by the attending provider.

## 2022-09-15 NOTE — Progress Notes (Signed)
  Radiation Oncology         [phone] ________________________________  Name: Suzanne Dickson MRN: [id number]  Date: 09/16/2022  DOB: [birth year]  End of Treatment Note  Diagnosis: The primary encounter diagnosis was Malignant neoplasm of upper lobe bronchus, left (HCC). A diagnosis of Adenocarcinoma of left lung, stage 1 (HCC) was also pertinent to this visit.   Adenocarcinoma of the left lung diagnosed in 2018 - now with an isolated recurrence in the left lung in March 2024 (non-biopsy proven)   Cancer Staging  Adenocarcinoma of left lung, stage 1 (HCC) Staging form: Lung, AJCC 8th Edition - Clinical: Stage IA1 (cT1a, cN0, cM0) - Signed by Si Gaul, MD on 03/17/2020  Indication for treatment: Curative        Radiation treatment dates: 08/02/22 through 08/17/22   Site/dose: Left lung - 50 Gy delivered in 10 Fx at 5 Gy/Fx  Beams/energy:  6X-FFF  Technique/Mode: IMRT / photon   Narrative: The patient tolerated radiation treatment relatively well. During her final weekly treatment check on 08/10/22, the patient endorsed fatigue and a cough. She denied any other side effects or concerns related to treatment.   Plan: The patient has completed radiation treatment. The patient will return to radiation oncology clinic for routine followup in one month. I advised them to call or return sooner if they have any questions or concerns related to their recovery or treatment.  -----------------------------------  Billie Lade, PhD, MD  This document serves as a record of services personally performed by Antony Blackbird, MD. It was created on his behalf by Neena Rhymes, a trained medical scribe. The creation of this record is based on the scribe's personal observations and the provider's statements to them. This document has been checked and approved by the attending provider.

## 2022-09-16 ENCOUNTER — Ambulatory Visit
Admission: RE | Admit: 2022-09-16 | Discharge: 2022-09-16 | Disposition: A | Discharge status: Home / Self Care | Payer: Medicare PPO | Source: Ambulatory Visit | Attending: Radiation Oncology | Admitting: Radiation Oncology

## 2022-09-16 ENCOUNTER — Encounter: Payer: Self-pay | Admitting: Radiation Oncology

## 2022-09-16 VITALS — BP 146/61 | HR 88 | Temp 97.5°F | Resp 18 | Ht 62.0 in

## 2022-09-16 DIAGNOSIS — C3492 Malignant neoplasm of unspecified part of left bronchus or lung: Secondary | ICD-10-CM

## 2022-09-16 DIAGNOSIS — C3412 Malignant neoplasm of upper lobe, left bronchus or lung: Secondary | ICD-10-CM | POA: Diagnosis not present

## 2022-09-16 DIAGNOSIS — Z923 Personal history of irradiation: Secondary | ICD-10-CM | POA: Diagnosis not present

## 2022-09-16 NOTE — Progress Notes (Signed)
Suzanne Dickson is here today for follow up post radiation to the lung.  Lung Side: Left,patient completed treatment on 08/17/22.  Does the patient complain of any of the following: Pain: Yes ,patient reports having back pain and left ribs.  Shortness of breath w/wo exertion: Yes. Patient continues on oxygen 5 liters via nasal canula.  Cough: No Hemoptysis: No Pain with swallowing: No Swallowing/choking concerns: No Appetite: Good  Energy Level: Low Post radiation skin Changes: No     Additional comments if applicable:   BP (!) 146/61 (BP Location: Left Arm, Patient Position: Sitting)   Pulse 88   Temp (!) 97.5 F (36.4 C) (Temporal)   Resp 18   Ht 5\' 2"  (1.575 m)   SpO2 100%   BMI 29.27 kg/m

## 2022-09-19 DIAGNOSIS — J42 Unspecified chronic bronchitis: Secondary | ICD-10-CM | POA: Diagnosis not present

## 2022-09-19 DIAGNOSIS — I10 Essential (primary) hypertension: Secondary | ICD-10-CM | POA: Diagnosis not present

## 2022-09-20 ENCOUNTER — Ambulatory Visit: Payer: Medicare PPO | Admitting: Internal Medicine

## 2022-09-21 DIAGNOSIS — I272 Pulmonary hypertension, unspecified: Secondary | ICD-10-CM | POA: Diagnosis not present

## 2022-09-21 DIAGNOSIS — J449 Chronic obstructive pulmonary disease, unspecified: Secondary | ICD-10-CM | POA: Diagnosis not present

## 2022-09-21 DIAGNOSIS — I2510 Atherosclerotic heart disease of native coronary artery without angina pectoris: Secondary | ICD-10-CM | POA: Diagnosis not present

## 2022-09-21 DIAGNOSIS — Z9981 Dependence on supplemental oxygen: Secondary | ICD-10-CM | POA: Diagnosis not present

## 2022-09-27 NOTE — Progress Notes (Unsigned)
Subjective:    Patient ID: Suzanne Dickson, female   DOB: 1945/03/09     MRN: [id number]    Brief patient profile:  19  yobf quit smoking on admit 09/02/14  and proved to have GOLD III copd 11/2014    History of Present Illness  09/22/2015  f/u ov/Kendrick Remigio re: copd III/ symb 1602bid on 2lpm at rest/ 4lpm ex  Chief Complaint  Patient presents with   Follow-up    [place] had hemoptysis and increased SOB. She took round pred and augmentin and symptoms are some better. She has noticed minimal wheezing at night. She has used neb 3 x over the past 2 wks.   liked bevespi better but wasn't covered then and not doing as well on the symbicort though hfa not ideal  - see a/p rec Plan A = Automatic =stop symbicort and restart Bevespi Take 2 puffs first thing in am and then another 2 puffs about 12 hours later.  Work on inhaler technique:     Plan B = Backup - only use your albuterol nebulizer if you first try Plan B and it fails to help > ok to use the nebulizer up to every 4 hours but if start needing it regularly call for immediate appointment    PET 02/20/15 Malignant range FDG uptake is associated with the left upper lobe pulmonary nodule. 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  Dyspnea:  walking at home room to room rolling walker  Cough: none  Sleeping: 45 degrees  SABA use: albuterol q 2 days hfa/ very rarely neg  02: 3lpm sitting and up to 6lpm walking  Rec Reduce prednisone to 10 mg daily - if worse back to 20  mg and if stay better back it down to 10 mg one half daily  Tug  harder on the  Trelegy inhaler as I showed you today  Please schedule a follow up visit in 6 months but call sooner if needed    11/26/2021  f/u ov/Meeyah Ovitt re: GOLD 2 copd/02 dep   maint on 10 mg /daily worse on 5mg   Chief Complaint  Patient presents with   Follow-up  Dyspnea:  across the room / sometimes sits outside garage Cough: none  Sleeping: 45 degrees wedge pillow  SABA use: uses hfa but neb one or two a day  02: 4 sleeping and up to  6lpm walking with sats mostly 90s  Rec Prednisone 10 mg alternating with 5 mg  Plan A = Automatic = Always=    Trelegy 100 each am  Plan B = Backup (to supplement plan A, not to replace it) Only use your albuterol inhaler as a rescue medication Plan C = Crisis (instead of Plan B but only if Plan B stops working) - only use your albuterol nebulizer if you first try Plan B  Admit date: 05/24/2022 Discharge date: 05/31/2022   Admitted From: (Home) Disposition:  (Home)   Recommendations for Outpatient Follow-up:  Follow  up with PCP in 1-2 weeks Please obtain BMP/CBC in one week       Discharge Condition: (Stable) CODE STATUS: ( DNR, SEEN  by palliative) Diet recommendation: Heart Healthy    Brief/Interim Summary:   Patient is a 77 y.o.  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  Patient was transferred to ICU on 4/30, secondary to increased work of breathing, she is transferred back to [facility] 06/24/2022 as her respiratory status has improved.   Significant events: 4/29>> admit to [facility]-sepsis-presumed pyelonephritis.  Post admission-developed SOB-placed on BiPAP. 4/30>> blood cultures positive for Proteus.  Failed liberation of BiPAP.  Transferred to ICU 5/30>>transferred out of ICU    Significant studies: 4/29>> CT head: No acute intracranial abnormality 4/29>> CT abdomen/pelvis: Motion artifact-left mid kidney with stranding/enhancement. 4/30>> CXR: Increased interstitial opacities-pulm edema versus atypical infection.   Significant microbiology data: 4/29>> COVID PCR: Negative 4/29>> blood culture: Proteus mirabilis, pansensitive   Severe sepsis secondary to left pyelonephritis and Proteus bacteremia -Severe sepsis present on admission -Workup significant for Proteus bacteremia, treated with IV Rocephin during hospital stay, she will be discharged on Keflex for total of 10 days treatment.   Acute on chronic hypoxic respiratory failure (on 6-8 L of oxygen at baseline) -Requiring BiPAP support, she was transferred to ICU, weaned off BiPAP.  At baseline 6 to 8 L nasal cannula - She was encouraged to use incentive spirometry and flutter valve   COPD exacerbation -With some wheezing, treated with IV steroids, she will be discharged on increased dose prednisone for next 3 days, then back on home dose prednisone 10 mg oral daily.     Acute metabolic encephalopathy Seems to have improved-likely secondary to sepsis physiology CT head negative Mentation back to baseline   Macrocytic anemia Hemoglobin at baseline   Thrombocytopenia Due to sepsis physiology Mild   COPD-chronic hypoxia-6-8 L of oxygen at baseline On daily prednisone   HTN Continue with amlodipine   CKD stage IIIb Close to baseline-mildly elevated creatinine   OSA per spouse-noncompliant with CPAP at home   Stage Ia non-small cell cancer of the lung  Follows with Dr. Arbutus Ped, suspicion for recurrence, following with Dr. Arbutus Ped with plan for PET scan as an outpatient, which has been done 4/25, unfortunately it does show suspicion for recurrence of malignancy. Marland Kitchen   BMI: Estimated body mass index is 29.84 kg/m as calculated from the following:   Height as of this encounter: 5\' 2"  (1.575 m).    Weight as of this encounter: 74 kg.    Goals of care -Palliative  medicine consulted, they have tried to arrange meeting with the husband, patient is clear that she wishes for DNR, this has been confirmed during hospital stay, patient is currently DNR.       06/09/2022 107m  f/u ov/Tory Mckissack re: GOLD 2/ 02 dep    maint on Trelegy   Chief Complaint  Patient presents with   Follow-up    Breathing is good.  Low energy.  Weak from kidney infection.  Hospitalized  05/24/22 to 05/31/2022 Sepsis.   Dyspnea:  walks with walker thru house on 6lpm  Cough: none  Sleeping: 45 degrees wedge pillow - has old cpap machine not using  SABA use: occ hfa/ rarely neb  02: 4lpm sleeping 6 lpm with activity  Rec Taper your prednisone down to 10 mg x one-half daily (5 mg)  as before if you can Please see Dr Maple Hudson for re-evaluation of your sleep apnea       09/28/2022  3 m f/u ov/Aryn Kops re: ***   maint on ***  No chief complaint on file.   Dyspnea:  *** Cough: *** Sleeping: *** resp cc  SABA use: *** 02: ***  Lung cancer screening :  ***    No obvious day to day or daytime variability or assoc excess/ purulent sputum or mucus plugs or hemoptysis or cp or chest tightness, subjective wheeze or overt sinus or hb symptoms.    Also denies any obvious fluctuation of symptoms with weather or environmental changes or other aggravating or alleviating factors except as outlined above   No unusual exposure hx or h/o childhood pna/ asthma or knowledge of premature birth.  Current Allergies, Complete Past Medical History, Past Surgical History, Family History, and Social History were reviewed in [employer] record.  ROS  The following are not active complaints unless bolded Hoarseness, sore throat, dysphagia, dental problems, itching, sneezing,  nasal congestion or discharge of excess mucus or purulent secretions, ear ache,   fever, chills, sweats, unintended wt loss or wt gain, classically pleuritic  or exertional cp,  orthopnea pnd or arm/hand swelling  or leg swelling, presyncope, palpitations, abdominal pain, anorexia, nausea, vomiting, diarrhea  or change in bowel habits or change in bladder habits, change in stools or change in urine, dysuria, hematuria,  rash, arthralgias, visual complaints, headache, numbness, weakness or ataxia or problems with walking or coordination,  change in mood or  memory.        No outpatient medications have been marked as taking for the 09/28/22 encounter (Appointment) with Nyoka Cowden, MD.                 Objective:  Physical Exam  Wts  09/28/2022      ***  06/09/2022   160  05/25/2021     156 02/16/2021   158 06/02/2020     170  12/03/2019   162 03/29/2019     164  08/08/2018   154 10/28/2014        160 > 12/09/2014 161 > 03/14/2015 158 >    04/25/2015  161 >  06/20/2015  158 > 09/22/2015   159 > 11/19/2015 159 > 12/23/2015   159  > 02/03/2016    163  > 05/18/2016   161 > 11/17/2016   154 > 01/07/2017  157 > 04/18/2017   148 > 07/19/2017  145 > 10/19/2017    150  > 11/16/2017   150 >  01/11/2018   150> 02/17/2018  150     10/10/14 160 lb (72.576 kg)  09/16/14 156 lb (70.761 kg)  09/04/14 160 lb 13.6 oz (72.96 kg)   Vital signs reviewed  09/28/2022  - Note at rest 02 sats  ***% on ***   General appearance:    ***     Mild barr***           Assessment:

## 2022-09-28 ENCOUNTER — Encounter: Payer: Self-pay | Admitting: Internal Medicine

## 2022-09-28 ENCOUNTER — Ambulatory Visit: Payer: Medicare PPO | Admitting: Internal Medicine

## 2022-09-28 VITALS — BP 138/76 | HR 98 | Temp 98.2°F | Ht 62.0 in | Wt 154.0 lb

## 2022-09-28 DIAGNOSIS — J9611 Chronic respiratory failure with hypoxia: Secondary | ICD-10-CM | POA: Diagnosis not present

## 2022-09-28 DIAGNOSIS — J449 Chronic obstructive pulmonary disease, unspecified: Secondary | ICD-10-CM | POA: Diagnosis not present

## 2022-09-28 MED ORDER — FLUTICASONE FUROATE-VILANTEROL 100-25 MCG/ACT IN AEPB: 1.0000 | INHALATION_SPRAY | Freq: Every morning | RESPIRATORY_TRACT | 11 refills | Status: DC

## 2022-09-28 NOTE — Assessment & Plan Note (Signed)
Started on 02 2lpm at d/c 09/04/14  - 10/28/2014   Walked RA x one lap @ 185 stopped due to  desat to 82%  at nl pace so rec 02 2lpm with walking and sleeping  - 12/09/2014 referred for POC > did not qualify - 08/17/2016 Patient Saturations on Room Air at Rest = 85%---increased to 93% on 2lpm o2 - 04/18/2017  Walking across exam room sats dropped into 80's on 2lpm  - 11/16/2017   Walked RA x one lap @ 185 stopped due to 87% and even on 5lpm could not maintain sats at nl pace  -  01/11/2018 humidfy 02  Added > not using as of 05/08/2018 > rec she do so > improved on humidity - 03/29/2019  Required up to 6lpm with activity to complete about 200 ft walking   She is less active now so needs to tritrate down with goal of > 90% at all times  Rec: Make sure you check your oxygen saturation  AT  your highest level of activity (not after you stop)   to be sure it stays over 90% and adjust  02 flow upward to maintain this level if needed but remember to turn it back to previous settings when you stop (to conserve your supply).   F/u in 3 m, sooner if needed          Each maintenance medication was reviewed in detail including emphasizing most importantly the difference between maintenance and prns and under what circumstances the prns are to be triggered using an action plan format where appropriate.  Total time for H and P, chart review, counseling, reviewing hfa/dpi/ neb/ 02/ pulse ox  device(s) and generating customized AVS unique to this office visit / same day charting = 

## 2022-09-28 NOTE — Assessment & Plan Note (Signed)
Quit smoking 08/2014 - PFT's 12/09/14   FEV1 0.75 (44 % ) ratio 62  p no % improvement from saba p ? prior to study with DLCO  24 % corrects to 48 % for alv volume    - 12/09/2014 referred to rehab > started 12/2014  - 04/25/2015   try BEVESPI  - 06/20/2015  Changed by insurance> back on symb 160  - 09/22/2015 not happy with symbicort > changed back to besvespi 2bid > not happy with bevespi > changed back to symb 160 2bid and consider adding spiriva next vs trelegy trial   - 02/17/2018  After extensive coaching inhaler device,  effectiveness =    50% with hfa and 90% with elipta so rec changed to trelegy   - 05/08/2018 added pred x 6 days as "plan D"  - 01/15/2019 changed pred to 20 mg until better then 10 mg daily  - 03/29/2019  After extensive coaching inhaler device,  effectiveness =    50% with hfa  - 12/03/2019 changed to ceiling of 20 mg pred/ floor of 5 mg per day - 02/16/2021 chagned pred 20 mg ceiling and floor of 10 mg as flared on 5 consistently  -  11/26/21 rechallenged with ceiling of 20 mg and floor of 5 mg daily/ retrained on Elipta  -  09/28/2022 rec change trelegy to [facility] due to constipation   Main complaints now are constipation and back pain so rec trial off LAMA see avs for instructions unique to this ov

## 2022-09-28 NOTE — Patient Instructions (Signed)
Plan A = Automatic = Always=    Breo 100 one click each am - alternative advair 250 one twice daily   Plan B = Backup (to supplement plan A, not to replace it) Only use your albuterol inhaler as a rescue medication to be used if you can't catch your breath by resting or doing a relaxed purse lip breathing pattern.  - The less you use it, the better it will work when you need it. - Ok to use the inhaler up to 2 puffs  every 4 hours if you must but call for appointment if use goes up over your usual need - Don't leave home without it !!  (think of it like the spare tire for your car)   Plan C = Crisis (instead of Plan B but only if Plan B stops working) - only use your albuterol nebulizer if you first try Plan B and it fails to help > ok to use the nebulizer up to every 4 hours but if start needing it regularly call for immediate appointment  Make sure you check your oxygen saturation  AT  your highest level of activity (not after you stop)   to be sure it stays over 90% and adjust  02 flow upward to maintain this level if needed but remember to turn it back to previous settings when you stop (to conserve your supply).     Please schedule a follow up visit in 3 months but call sooner if needed

## 2022-09-29 DIAGNOSIS — F419 Anxiety disorder, unspecified: Secondary | ICD-10-CM | POA: Diagnosis not present

## 2022-09-29 DIAGNOSIS — E119 Type 2 diabetes mellitus without complications: Secondary | ICD-10-CM | POA: Diagnosis not present

## 2022-09-29 DIAGNOSIS — I10 Essential (primary) hypertension: Secondary | ICD-10-CM | POA: Diagnosis not present

## 2022-09-29 DIAGNOSIS — K5901 Slow transit constipation: Secondary | ICD-10-CM | POA: Diagnosis not present

## 2022-10-20 DIAGNOSIS — I10 Essential (primary) hypertension: Secondary | ICD-10-CM | POA: Diagnosis not present

## 2022-10-20 DIAGNOSIS — J42 Unspecified chronic bronchitis: Secondary | ICD-10-CM | POA: Diagnosis not present

## 2022-10-21 ENCOUNTER — Ambulatory Visit (HOSPITAL_COMMUNITY)
Admission: RE | Admit: 2022-10-21 | Discharge: 2022-10-21 | Disposition: A | Discharge status: Home / Self Care | Payer: Medicare PPO | Source: Ambulatory Visit | Attending: Physician Assistant | Admitting: Physician Assistant

## 2022-10-21 ENCOUNTER — Inpatient Hospital Stay: Payer: Medicare PPO | Attending: Internal Medicine

## 2022-10-21 DIAGNOSIS — C3492 Malignant neoplasm of unspecified part of left bronchus or lung: Secondary | ICD-10-CM | POA: Insufficient documentation

## 2022-10-21 DIAGNOSIS — J432 Centrilobular emphysema: Secondary | ICD-10-CM | POA: Diagnosis not present

## 2022-10-21 DIAGNOSIS — Z7902 Long term (current) use of antithrombotics/antiplatelets: Secondary | ICD-10-CM | POA: Insufficient documentation

## 2022-10-21 DIAGNOSIS — Z87440 Personal history of urinary (tract) infections: Secondary | ICD-10-CM | POA: Insufficient documentation

## 2022-10-21 DIAGNOSIS — M4854XA Collapsed vertebra, not elsewhere classified, thoracic region, initial encounter for fracture: Secondary | ICD-10-CM | POA: Insufficient documentation

## 2022-10-21 DIAGNOSIS — Z9049 Acquired absence of other specified parts of digestive tract: Secondary | ICD-10-CM | POA: Insufficient documentation

## 2022-10-21 DIAGNOSIS — Z79899 Other long term (current) drug therapy: Secondary | ICD-10-CM | POA: Insufficient documentation

## 2022-10-21 DIAGNOSIS — Z923 Personal history of irradiation: Secondary | ICD-10-CM | POA: Insufficient documentation

## 2022-10-21 DIAGNOSIS — R5383 Other fatigue: Secondary | ICD-10-CM | POA: Insufficient documentation

## 2022-10-21 DIAGNOSIS — Z7951 Long term (current) use of inhaled steroids: Secondary | ICD-10-CM | POA: Insufficient documentation

## 2022-10-21 DIAGNOSIS — C3412 Malignant neoplasm of upper lobe, left bronchus or lung: Secondary | ICD-10-CM | POA: Insufficient documentation

## 2022-10-21 DIAGNOSIS — M2550 Pain in unspecified joint: Secondary | ICD-10-CM | POA: Insufficient documentation

## 2022-10-21 DIAGNOSIS — N281 Cyst of kidney, acquired: Secondary | ICD-10-CM | POA: Insufficient documentation

## 2022-10-21 DIAGNOSIS — Z9089 Acquired absence of other organs: Secondary | ICD-10-CM | POA: Insufficient documentation

## 2022-10-21 DIAGNOSIS — R0609 Other forms of dyspnea: Secondary | ICD-10-CM | POA: Insufficient documentation

## 2022-10-21 DIAGNOSIS — E8729 Other acidosis: Secondary | ICD-10-CM | POA: Insufficient documentation

## 2022-10-21 DIAGNOSIS — I700 Atherosclerosis of aorta: Secondary | ICD-10-CM | POA: Diagnosis not present

## 2022-10-21 DIAGNOSIS — R509 Fever, unspecified: Secondary | ICD-10-CM | POA: Insufficient documentation

## 2022-10-21 DIAGNOSIS — J449 Chronic obstructive pulmonary disease, unspecified: Secondary | ICD-10-CM | POA: Insufficient documentation

## 2022-10-21 DIAGNOSIS — I2510 Atherosclerotic heart disease of native coronary artery without angina pectoris: Secondary | ICD-10-CM | POA: Insufficient documentation

## 2022-10-21 DIAGNOSIS — C3490 Malignant neoplasm of unspecified part of unspecified bronchus or lung: Secondary | ICD-10-CM | POA: Diagnosis not present

## 2022-10-21 LAB — CMP (CANCER CENTER ONLY)
ALT: 15 U/L (ref 0–44)
AST: 19 U/L (ref 15–41)
Albumin: 4.4 g/dL (ref 3.5–5.0)
Alkaline Phosphatase: 44 U/L (ref 38–126)
Anion gap: 6 (ref 5–15)
BUN: 35 mg/dL — ABNORMAL HIGH (ref 8–23)
CO2: 34 mmol/L — ABNORMAL HIGH (ref 22–32)
Calcium: 10.1 mg/dL (ref 8.9–10.3)
Chloride: 103 mmol/L (ref 98–111)
Creatinine: 1.4 mg/dL — ABNORMAL HIGH (ref 0.44–1.00)
GFR, Estimated: 39 mL/min — ABNORMAL LOW (ref >60)
Glucose, Bld: 129 mg/dL — ABNORMAL HIGH (ref 70–99)
Potassium: 4.5 mmol/L (ref 3.5–5.1)
Sodium: 143 mmol/L (ref 135–145)
Total Bilirubin: 0.5 mg/dL (ref 0.3–1.2)
Total Protein: 7.4 g/dL (ref 6.5–8.1)

## 2022-10-21 LAB — CBC WITH DIFFERENTIAL (CANCER CENTER ONLY)
Abs Immature Granulocytes: 0.09 10*3/uL — ABNORMAL HIGH (ref 0.00–0.07)
Basophils Absolute: 0 10*3/uL (ref 0.0–0.1)
Basophils Relative: 0 %
Eosinophils Absolute: 0.1 10*3/uL (ref 0.0–0.5)
Eosinophils Relative: 1 %
HCT: 35.3 % — ABNORMAL LOW (ref 36.0–46.0)
Hemoglobin: 10.7 g/dL — ABNORMAL LOW (ref 12.0–15.0)
Immature Granulocytes: 1 %
Lymphocytes Relative: 18 %
Lymphs Abs: 1.6 10*3/uL (ref 0.7–4.0)
MCH: 31.8 pg (ref 26.0–34.0)
MCHC: 30.3 g/dL (ref 30.0–36.0)
MCV: 105.1 fL — ABNORMAL HIGH (ref 80.0–100.0)
Monocytes Absolute: 0.7 10*3/uL (ref 0.1–1.0)
Monocytes Relative: 8 %
Neutro Abs: 6.2 10*3/uL (ref 1.7–7.7)
Neutrophils Relative %: 72 %
Platelet Count: 157 10*3/uL (ref 150–400)
RBC: 3.36 MIL/uL — ABNORMAL LOW (ref 3.87–5.11)
RDW: 11.8 % (ref 11.5–15.5)
WBC Count: 8.7 10*3/uL (ref 4.0–10.5)
nRBC: 0 % (ref 0.0–0.2)

## 2022-10-21 MED ORDER — SODIUM CHLORIDE (PF) 0.9 % IJ SOLN
INTRAMUSCULAR | Status: AC
  Filled 2022-10-21: qty 50

## 2022-10-21 MED ORDER — IOHEXOL 300 MG/ML  SOLN
60.0000 mL | Freq: Once | INTRAMUSCULAR | Status: AC | PRN
  Administered 2022-10-21: 60 mL via INTRAVENOUS

## 2022-10-25 ENCOUNTER — Inpatient Hospital Stay: Payer: Medicare PPO | Admitting: Internal Medicine

## 2022-10-25 VITALS — BP 159/62 | HR 91 | Temp 97.9°F | Resp 16 | Ht 62.0 in

## 2022-10-25 DIAGNOSIS — R509 Fever, unspecified: Secondary | ICD-10-CM | POA: Diagnosis not present

## 2022-10-25 DIAGNOSIS — J449 Chronic obstructive pulmonary disease, unspecified: Secondary | ICD-10-CM | POA: Diagnosis not present

## 2022-10-25 DIAGNOSIS — C3490 Malignant neoplasm of unspecified part of unspecified bronchus or lung: Secondary | ICD-10-CM | POA: Diagnosis not present

## 2022-10-25 DIAGNOSIS — Z923 Personal history of irradiation: Secondary | ICD-10-CM | POA: Diagnosis not present

## 2022-10-25 DIAGNOSIS — E8729 Other acidosis: Secondary | ICD-10-CM | POA: Diagnosis not present

## 2022-10-25 DIAGNOSIS — Z9089 Acquired absence of other organs: Secondary | ICD-10-CM | POA: Diagnosis not present

## 2022-10-25 DIAGNOSIS — Z7902 Long term (current) use of antithrombotics/antiplatelets: Secondary | ICD-10-CM | POA: Diagnosis not present

## 2022-10-25 DIAGNOSIS — M4854XA Collapsed vertebra, not elsewhere classified, thoracic region, initial encounter for fracture: Secondary | ICD-10-CM | POA: Diagnosis not present

## 2022-10-25 DIAGNOSIS — N281 Cyst of kidney, acquired: Secondary | ICD-10-CM | POA: Diagnosis not present

## 2022-10-25 DIAGNOSIS — R5383 Other fatigue: Secondary | ICD-10-CM | POA: Diagnosis not present

## 2022-10-25 DIAGNOSIS — I700 Atherosclerosis of aorta: Secondary | ICD-10-CM | POA: Diagnosis not present

## 2022-10-25 DIAGNOSIS — I2510 Atherosclerotic heart disease of native coronary artery without angina pectoris: Secondary | ICD-10-CM | POA: Diagnosis not present

## 2022-10-25 DIAGNOSIS — Z7951 Long term (current) use of inhaled steroids: Secondary | ICD-10-CM | POA: Diagnosis not present

## 2022-10-25 DIAGNOSIS — M2550 Pain in unspecified joint: Secondary | ICD-10-CM | POA: Diagnosis not present

## 2022-10-25 DIAGNOSIS — Z87440 Personal history of urinary (tract) infections: Secondary | ICD-10-CM | POA: Diagnosis not present

## 2022-10-25 DIAGNOSIS — C3412 Malignant neoplasm of upper lobe, left bronchus or lung: Secondary | ICD-10-CM | POA: Diagnosis not present

## 2022-10-25 DIAGNOSIS — Z79899 Other long term (current) drug therapy: Secondary | ICD-10-CM | POA: Diagnosis not present

## 2022-10-25 DIAGNOSIS — Z9049 Acquired absence of other specified parts of digestive tract: Secondary | ICD-10-CM | POA: Diagnosis not present

## 2022-10-25 DIAGNOSIS — R0609 Other forms of dyspnea: Secondary | ICD-10-CM | POA: Diagnosis not present

## 2022-10-25 NOTE — Progress Notes (Signed)
[facility] [phone]   [phone]  OFFICE PROGRESS NOTE  Dot Been, FNP [address] [place] [address]  DIAGNOSIS: Stage IA (T1a, N0, M0) non-small cell lung cancer, adenocarcinoma presented with left upper lobe lung nodule diagnosed in March 2018.  PRIOR THERAPY:  1) S/P RT to the left lung nodule under the care of Dr. Roselind Messier completed on 05/04/2016. 2) status post curative radiotherapy to the posterior left upper lobe lung nodule under the care of Dr. Roselind Messier completed on August 17, 2022  CURRENT THERAPY: Observation.  INTERVAL HISTORY: Suzanne Dickson 77 y.o. female returns to the clinic today for follow-up visit accompanied by her husband.Discussed the use of AI scribe software for clinical note transcription with the patient, who gave verbal consent to proceed. History of Present Illness   Suzanne Dickson, a 77 year old patient with a history of stage one lung cancer, presents for a follow-up visit after completing curative SBRT for a new nodule in the posterior left upper lobe of the lung in July 2024. The patient tolerated the radiation treatment well, with only rib discomfort reported. No new respiratory symptoms, such as changes in breathing or shortness of breath, were noted post-radiation.  The patient also reports a history of urinary tract infections, with the most recent episode occurring in July 2024, just before the last radiation treatment. These infections were severe enough to warrant hospitalization due to fever. The patient was taking iron pills three times a day, which were suspected to be the cause of the infections, and was also switched to a different daily inhaler around the same time.  The patient has a history of COPD and is on home oxygen. She reports shortness of breath when anxious but denies any new chest pain, coughing, or hemoptysis. The patient also denies any gastrointestinal symptoms such as nausea,  vomiting, diarrhea, or constipation.       MEDICAL HISTORY: Past Medical History:  Diagnosis Date   Adenocarcinoma of left lung, 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   "I'm in remission" (04/05/2017)   Adenocarcinoma, lung (HCC)    Anxiety    Arthritis    "legs, elbows" (04/05/2017)   Bronchitis, chronic (HCC)    "haven't had it in awhile" (04/05/2017)   CHF (congestive heart failure) (HCC)    COPD (chronic obstructive pulmonary disease) (HCC)    Coronary artery disease    Daily headache    "recently" (04/05/2017)   Depression    Dyspnea    with exertion   GERD (gastroesophageal reflux disease)    History of blood transfusion 1950s   History of radiation therapy 05/04/16 - 05/12/16   Left lung treated to 54 Gy with 3 fx of 18 Gy   History of radiation therapy    Left Lung- 08/02/22-08/17/22- Dr. Antony Blackbird   Hypertension    On home oxygen therapy    "2L; 24/7" (04/05/2017)   OSA (obstructive sleep apnea)    OSA on CPAP    Pneumonia 10/2015   "at least 3 times" (04/05/2017)   Pulmonary hypertension (HCC)     ALLERGIES:  has No Known Allergies.  MEDICATIONS:  Current Outpatient Medications  Medication Sig Dispense Refill   acetaminophen (TYLENOL) 500 MG tablet Take 1 tablet (500 mg total) by mouth every 6 (six) hours as needed. (Patient taking differently: Take 1,000 mg by mouth in the morning and at bedtime.) 30 tablet 0   albuterol (PROVENTIL) (2.5 MG/3ML) 0.083% nebulizer solution  USE 1 VIAL IN NEBULIZER EVERY 4 HOURS - And As Needed For Shortness of Breath (Patient taking differently: Take 2.5 mg by nebulization as needed for wheezing or shortness of breath.) 60 mL 11   albuterol (VENTOLIN HFA) 108 (90 Base) MCG/ACT inhaler Inhale 2 puffs into the lungs 2 (two) times daily as needed for wheezing or shortness of breath. 17 each 0   amLODipine (NORVASC) 5 MG tablet Take 5 mg by mouth daily.     ascorbic acid (VITAMIN C) 1000 MG tablet Take 1,000 mg by mouth daily.     aspirin EC 81 MG  tablet Take 81 mg by mouth daily. Swallow whole.     atorvastatin (LIPITOR) 20 MG tablet Take 20 mg by mouth at bedtime.     busPIRone (BUSPAR) 10 MG tablet Take 1 tablet by mouth 2 (two) times daily.     cetirizine (ZYRTEC) 10 MG tablet Take 10 mg by mouth at bedtime.     cholecalciferol (VITAMIN D3) 25 MCG (1000 UNIT) tablet Take 1,000 Units by mouth daily.     clopidogrel (PLAVIX) 75 MG tablet Take 75 mg by mouth every morning.      Cyanocobalamin (B-12 PO) Take 1 tablet by mouth daily.     ferrous sulfate 325 (65 FE) MG EC tablet Take 975 mg by mouth daily.     fluticasone furoate-vilanterol (BREO ELLIPTA) 100-25 MCG/ACT AEPB Inhale 1 puff into the lungs every morning. 28 each 11   losartan (COZAAR) 50 MG tablet Take 50 mg by mouth every morning.     methocarbamol (ROBAXIN) 500 MG tablet Take 500 mg by mouth in the morning and at bedtime.     metoprolol succinate (TOPROL-XL) 25 MG 24 hr tablet Take 25 mg by mouth daily.     nitroGLYCERIN (NITRODUR - DOSED IN MG/24 HR) 0.4 mg/hr patch Place 0.4 mg onto the skin every other day.  3   nitroGLYCERIN (NITROSTAT) 0.4 MG SL tablet Place 0.4 mg under the tongue every 5 (five) minutes as needed for chest pain.     omeprazole (PRILOSEC) 20 MG capsule Take 20 mg by mouth daily.     OXYGEN Inhale 4-7 L into the lungs continuous.     Polyvinyl Alcohol-Povidone (REFRESH OP) Place 1 drop into both eyes daily as needed (dry eyes).     predniSONE (DELTASONE) 10 MG tablet Take 1 tablet (10 mg total) by mouth daily as needed. 100 tablet 0   senna-docusate (SENOKOT-S) 8.6-50 MG tablet Take 2 tablets by mouth at bedtime.     No current facility-administered medications for this visit.    SURGICAL HISTORY:  Past Surgical History:  Procedure Laterality Date   COLONOSCOPY     CORONARY ANGIOPLASTY WITH STENT PLACEMENT  04/05/2017   CORONARY STENT INTERVENTION N/A 04/05/2017   Procedure: CORONARY STENT INTERVENTION;  Surgeon: Rinaldo Cloud, MD;  Location: [facility]  INVASIVE CV LAB;  Service: Cardiovascular;  Laterality: N/A;   DILATION AND CURETTAGE OF UTERUS     FUDUCIAL PLACEMENT Left 03/31/2016   Procedure: PLACEMENT OF FUDUCIAL LEFT UPPER LOBE;  Surgeon: Leslye Peer, MD;  Location: [facility] OR;  Service: Thoracic;  Laterality: Left;   LAPAROSCOPIC CHOLECYSTECTOMY     LEFT HEART CATH AND CORONARY ANGIOGRAPHY N/A 02/17/2017   Procedure: LEFT HEART CATH AND CORONARY ANGIOGRAPHY;  Surgeon: Rinaldo Cloud, MD;  Location: [facility] INVASIVE CV LAB;  Service: Cardiovascular;  Laterality: N/A;   SINUSOTOMY     TONSILLECTOMY     TUBAL LIGATION  VIDEO BRONCHOSCOPY WITH ENDOBRONCHIAL NAVIGATION N/A 03/31/2016   Procedure: VIDEO BRONCHOSCOPY WITH ENDOBRONCHIAL NAVIGATION;  Surgeon: Leslye Peer, MD;  Location: [facility] OR;  Service: Thoracic;  Laterality: N/A;    REVIEW OF SYSTEMS:  A comprehensive review of systems was negative except for: Constitutional: positive for fatigue Respiratory: positive for dyspnea on exertion Musculoskeletal: positive for arthralgias   PHYSICAL EXAMINATION: General appearance: alert, cooperative, fatigued, and no distress Head: Normocephalic, without obvious abnormality, atraumatic Neck: no adenopathy, no JVD, supple, symmetrical, trachea midline, and thyroid not enlarged, symmetric, no tenderness/mass/nodules Lymph nodes: Cervical, supraclavicular, and axillary nodes normal. Resp: clear to auscultation bilaterally Back: symmetric, no curvature. ROM normal. No CVA tenderness. Cardio: regular rate and rhythm, S1, S2 normal, no murmur, click, rub or gallop GI: soft, non-tender; bowel sounds normal; no masses,  no organomegaly Extremities: extremities normal, atraumatic, no cyanosis or edema  ECOG PERFORMANCE STATUS: 1 - Symptomatic but completely ambulatory  Blood pressure (!) 159/62, pulse 91, temperature 97.9 F (36.6 C), temperature source Oral, resp. rate 16, height 5\' 2"  (1.575 m), SpO2 100%.  LABORATORY DATA: Lab Results  Component  Value Date   WBC 8.7 10/21/2022   HGB 10.7 (L) 10/21/2022   HCT 35.3 (L) 10/21/2022   MCV 105.1 (H) 10/21/2022   PLT 157 10/21/2022      Chemistry      Component Value Date/Time   NA 143 10/21/2022 1014   NA 141 08/13/2016 1120   K 4.5 10/21/2022 1014   K 3.4 (L) 08/13/2016 1120   CL 103 10/21/2022 1014   CO2 34 (H) 10/21/2022 1014   CO2 25 08/13/2016 1120   BUN 35 (H) 10/21/2022 1014   BUN 15.8 08/13/2016 1120   CREATININE 1.40 (H) 10/21/2022 1014   CREATININE 1.1 08/13/2016 1120      Component Value Date/Time   CALCIUM 10.1 10/21/2022 1014   CALCIUM 9.1 08/13/2016 1120   ALKPHOS 44 10/21/2022 1014   ALKPHOS 68 08/13/2016 1120   AST 19 10/21/2022 1014   AST 20 08/13/2016 1120   ALT 15 10/21/2022 1014   ALT 14 08/13/2016 1120   BILITOT 0.5 10/21/2022 1014   BILITOT 0.47 08/13/2016 1120       RADIOGRAPHIC STUDIES: CT Chest W Contrast  Result Date: 10/25/2022 CLINICAL DATA:  Non-small-cell lung cancer. Restaging. SIRT body on EXAM: CT CHEST WITH CONTRAST TECHNIQUE: Multidetector CT imaging of the chest was performed during intravenous contrast administration. RADIATION DOSE REDUCTION: This exam was performed according to the departmental dose-optimization program which includes automated exposure control, adjustment of the mA and/or kV according to patient size and/or use of iterative reconstruction technique. CONTRAST:  60mL OMNIPAQUE IOHEXOL 300 MG/ML  SOLN COMPARISON:  08/13/2022 FINDINGS: Cardiovascular: The heart size is normal. No substantial pericardial effusion. Coronary artery calcification is evident. Moderate atherosclerotic calcification is noted in the wall of the thoracic aorta. Mediastinum/Nodes: No mediastinal lymphadenopathy. No right hilar lymphadenopathy. Post treatment scarring again noted in the left hilum. The esophagus has normal imaging features. There is no axillary lymphadenopathy. Lungs/Pleura: Centrilobular and paraseptal emphysema evident. Similar  fibrosis/scarring in the parahilar left lung. 15 mm nodular component measured previously is 11 mm today on image 72/6. Chronic linear atelectasis or scarring seen anterior right lung. 6 mm posterior left upper lobe nodule along the fissure (52/6) is unchanged. No new suspicious pulmonary nodule or mass. No focal airspace consolidation. No pleural effusion. Upper Abdomen: Tiny low-density lesion in the dome of the left liver (113/2) was not well  seen on previous noncontrast studies but is stable since contrast infused chest CT of 02/26/2019 consistent with benign etiology. Right renal cyst again noted with atherosclerotic calcification in the abdominal aorta. Musculoskeletal: No worrisome lytic or sclerotic osseous abnormality. Multilevel thoracal lumbar compression deformity again noted. Superior endplate compression at T4 is new since prior. IMPRESSION: 1. Stable exam. No new or progressive findings to suggest recurrent or metastatic disease. 2. Stable 6 mm posterior left upper lobe pulmonary nodule. Continued attention on follow-up recommended. 3. Stable multilevel thoracolumbar compression fractures with new superior endplate compression deformity at T4. 4. Aortic Atherosclerosis (ICD10-I70.0) and Emphysema (ICD10-J43.9). Electronically Signed   By: Kennith Center M.D.   On: 10/25/2022 12:00     ASSESSMENT AND PLAN: This is a very pleasant 77 years old white female with a stage IA non-small cell lung cancer, adenocarcinoma presented with left upper lobe lung nodule. The patient is status post stereotactic radiotherapy to the left upper lobe lung nodule and she tolerated the procedure well. She is also status post SBRT to recurrent posterior left upper lobe lung nodule under the care of Dr. Roselind Messier completed in July 2024 The patient is currently on observation and she continues to have shortness of breath at baseline increased with exertion and currently on home oxygen. She had repeat CT scan of the chest  performed recently.  I personally and independently reviewed the scan images and discussed the result with the patient and her husband. Her scan showed no concerning findings for disease recurrence or metastasis. Assessment and Plan    Lung Cancer History of stage 1 lung cancer treated with radiation several years ago. Recent treatment of a new nodule in the posterior left upper lobe with curative SBRT completed on August 17, 2022. Recent imaging shows improvement with no new growth. -Continue routine follow-up every six months. -Order CT chest one week prior to next visit.  Urinary Tract Infections History of recurrent UTIs, last episode in July 2024. No recent episodes. -Continue current management.  COPD Chronic CO2 retention due to COPD. No new symptoms. -Continue current management.   She was advised to call immediately if she has any other concerning symptoms in the interval.  The patient voices understanding of current disease status and treatment options and is in agreement with the current care plan. All questions were answered. The patient knows to call the clinic with any problems, questions or concerns. We can certainly see the patient much sooner if necessary.  Disclaimer: This note was dictated with voice recognition software. Similar sounding words can inadvertently be transcribed and may not be corrected upon review.

## 2022-11-19 DIAGNOSIS — J42 Unspecified chronic bronchitis: Secondary | ICD-10-CM | POA: Diagnosis not present

## 2022-11-19 DIAGNOSIS — I10 Essential (primary) hypertension: Secondary | ICD-10-CM | POA: Diagnosis not present

## 2022-11-25 DIAGNOSIS — H25043 Posterior subcapsular polar age-related cataract, bilateral: Secondary | ICD-10-CM | POA: Diagnosis not present

## 2022-12-09 ENCOUNTER — Telehealth: Payer: Self-pay | Admitting: Internal Medicine

## 2022-12-09 DIAGNOSIS — H25813 Combined forms of age-related cataract, bilateral: Secondary | ICD-10-CM | POA: Diagnosis not present

## 2022-12-09 NOTE — Telephone Encounter (Signed)
Prednisone Refill   [facility] - [place], [place] - [address]

## 2022-12-11 ENCOUNTER — Other Ambulatory Visit: Payer: Self-pay | Admitting: Internal Medicine

## 2022-12-13 NOTE — Telephone Encounter (Signed)
Patient needs a refill of prednisone to be sent to the following pharmacy :  [facility] on [address]  Patient would also like a call [phone]

## 2022-12-14 NOTE — Telephone Encounter (Signed)
Rx sent 

## 2022-12-20 DIAGNOSIS — J42 Unspecified chronic bronchitis: Secondary | ICD-10-CM | POA: Diagnosis not present

## 2022-12-20 DIAGNOSIS — I10 Essential (primary) hypertension: Secondary | ICD-10-CM | POA: Diagnosis not present

## 2022-12-29 DIAGNOSIS — Z13220 Encounter for screening for lipoid disorders: Secondary | ICD-10-CM | POA: Diagnosis not present

## 2022-12-29 DIAGNOSIS — E663 Overweight: Secondary | ICD-10-CM | POA: Diagnosis not present

## 2022-12-29 DIAGNOSIS — I10 Essential (primary) hypertension: Secondary | ICD-10-CM | POA: Diagnosis not present

## 2022-12-29 DIAGNOSIS — F411 Generalized anxiety disorder: Secondary | ICD-10-CM | POA: Diagnosis not present

## 2022-12-29 DIAGNOSIS — E119 Type 2 diabetes mellitus without complications: Secondary | ICD-10-CM | POA: Diagnosis not present

## 2022-12-29 DIAGNOSIS — K5901 Slow transit constipation: Secondary | ICD-10-CM | POA: Diagnosis not present

## 2023-01-05 DIAGNOSIS — I10 Essential (primary) hypertension: Secondary | ICD-10-CM | POA: Diagnosis not present

## 2023-01-05 DIAGNOSIS — I272 Pulmonary hypertension, unspecified: Secondary | ICD-10-CM | POA: Diagnosis not present

## 2023-01-05 DIAGNOSIS — E119 Type 2 diabetes mellitus without complications: Secondary | ICD-10-CM | POA: Diagnosis not present

## 2023-01-05 DIAGNOSIS — I2510 Atherosclerotic heart disease of native coronary artery without angina pectoris: Secondary | ICD-10-CM | POA: Diagnosis not present

## 2023-01-19 DIAGNOSIS — I10 Essential (primary) hypertension: Secondary | ICD-10-CM | POA: Diagnosis not present

## 2023-01-19 DIAGNOSIS — J42 Unspecified chronic bronchitis: Secondary | ICD-10-CM | POA: Diagnosis not present

## 2023-02-18 ENCOUNTER — Ambulatory Visit: Payer: Medicare PPO | Admitting: Nurse Practitioner

## 2023-02-19 DIAGNOSIS — I10 Essential (primary) hypertension: Secondary | ICD-10-CM | POA: Diagnosis not present

## 2023-02-19 DIAGNOSIS — J42 Unspecified chronic bronchitis: Secondary | ICD-10-CM | POA: Diagnosis not present

## 2023-03-02 ENCOUNTER — Ambulatory Visit: Payer: Medicare PPO | Admitting: Nurse Practitioner

## 2023-03-02 ENCOUNTER — Encounter: Payer: Self-pay | Admitting: Nurse Practitioner

## 2023-03-02 ENCOUNTER — Ambulatory Visit (INDEPENDENT_AMBULATORY_CARE_PROVIDER_SITE_OTHER): Payer: Medicare PPO

## 2023-03-02 VITALS — BP 122/68 | HR 88 | Temp 97.7°F | Ht 62.0 in | Wt 154.0 lb

## 2023-03-02 DIAGNOSIS — R0609 Other forms of dyspnea: Secondary | ICD-10-CM

## 2023-03-02 DIAGNOSIS — I499 Cardiac arrhythmia, unspecified: Secondary | ICD-10-CM

## 2023-03-02 DIAGNOSIS — R918 Other nonspecific abnormal finding of lung field: Secondary | ICD-10-CM | POA: Diagnosis not present

## 2023-03-02 DIAGNOSIS — J9611 Chronic respiratory failure with hypoxia: Secondary | ICD-10-CM

## 2023-03-02 DIAGNOSIS — J441 Chronic obstructive pulmonary disease with (acute) exacerbation: Secondary | ICD-10-CM

## 2023-03-02 DIAGNOSIS — R0602 Shortness of breath: Secondary | ICD-10-CM | POA: Diagnosis not present

## 2023-03-02 DIAGNOSIS — J984 Other disorders of lung: Secondary | ICD-10-CM | POA: Diagnosis not present

## 2023-03-02 DIAGNOSIS — J069 Acute upper respiratory infection, unspecified: Secondary | ICD-10-CM | POA: Diagnosis not present

## 2023-03-02 DIAGNOSIS — I700 Atherosclerosis of aorta: Secondary | ICD-10-CM | POA: Diagnosis not present

## 2023-03-02 LAB — POCT INFLUENZA A/B
Influenza A, POC: NEGATIVE
Influenza B, POC: NEGATIVE

## 2023-03-02 LAB — POC COVID19 BINAXNOW: SARS Coronavirus 2 Ag: NEGATIVE

## 2023-03-02 MED ORDER — FLUTICASONE PROPIONATE 50 MCG/ACT NA SUSP: 2.0000 | Freq: Every day | NASAL | 2 refills | Status: AC

## 2023-03-02 NOTE — Progress Notes (Signed)
 @Patient  ID: Suzanne Dickson, female    DOB: September 17, 1945, 78 y.o.   MRN: [id number]  Chief Complaint  Patient presents with   Follow-up    Dyspnea with walking around house.  More weakness and fatigue than normal x 2 days.  Eyes are watery - seasonal allergies are starting to begin    Referring provider: Earvin Johnston PARAS, FNP  HPI: 78 year old female, former smoker followed for COPD mixed type and chronic respiratory failure on supplemental oxygen .  She is a patient of Dr. Chari and last seen in office 09/28/2022.  Past medical history significant for CAD, hypertension, PAH, OSA on CPAP, IDA, history of prolonged QT.  She has a history of NSCLC stage Ia status post SBRT.  TEST/EVENTS:  12/09/2014 PFT: FVC 59, FEV1 47, ratio 62, TLC 101, DLCO 24 10/21/2022 CT chest: Atherosclerosis/CAD.  Posttreatment scarring in the left hilum.  Emphysema.  Similar fibrosis/scarring in left lung.  15 mm nodular component measured 15 mm previously is now 11 mm.  Chronic linear atelectasis or scarring.  6 mm left upper lobe nodule unchanged.  No suspicious nodules or masses.  Tiny low-density lesion in the left liver, with benign etiology.  09/28/2022: OV with Dr. Darlean.  Maintained on Trelegy and chronic prednisone  alternating 10 and 5 mg.  Uses supplemental oxygen  4 L/min at night and up to 6 L/min walking.  Sleeps with a wedge pillow.  Having some constipation with Trelegy.  Recommend changed to [facility].  03/02/2023: Today - acute Discussed the use of AI scribe software for clinical note transcription with the patient, who gave verbal consent to proceed.  History of Present Illness   Suzanne Dickson is a 78 year old female who presents with fatigue, eye watering, and shortness of breath.  She experiences shortness of breath over the last couple of days, particularly when getting into bed, but it resolves after calming herself. No wheezing or hemoptysis. Her feet swell slightly, which she considers normal, and there  is no new or worsening swelling in her legs.   She has fatigue and feels run down over the past couple of days. Her eyes have been watering more. She feels weak and tired. No cough, but her nose runs frequently, which is not abnormal for her. She denies fevers, chills, hemoptysis, orthopnea.   She is currently taking prednisone , alternating between 10 mg one day and 5 mg the next. She monitors her blood sugar daily, with recent readings around 115 mg/dL, and has not experienced any hypoglycemic episodes. She typically uses 5 liters of oxygen , adjusting between 4 and 6 liters as needed, and notes that her oxygen  levels have been stable.       No Known Allergies  Immunization History  Administered Date(s) Administered   Fluad Quad(high Dose 65+) 12/26/2018   Influenza, High Dose Seasonal PF 11/17/2016, 10/19/2017   Influenza,inj,Quad PF,6+ Mos 10/28/2014, 09/22/2015, 12/26/2018   Influenza-Unspecified 11/09/2019   PFIZER(Purple Top)SARS-COV-2 Vaccination 02/07/2019, 03/06/2019, 11/09/2019   Pneumococcal Conjugate-13 09/26/2015   Pneumococcal Polysaccharide-23 06/04/2009    Past Medical History:  Diagnosis Date   Adenocarcinoma of left lung, 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   I'm in remission (04/05/2017)   Adenocarcinoma, lung (HCC)    Anxiety    Arthritis    legs, elbows (04/05/2017)   Bronchitis, chronic (HCC)    haven't had it in awhile (04/05/2017)   CHF (congestive heart failure) (HCC)    COPD (chronic obstructive pulmonary disease) (HCC)    Coronary artery disease  Daily headache    recently (04/05/2017)   Depression    Dyspnea    with exertion   GERD (gastroesophageal reflux disease)    History of blood transfusion 1950s   History of radiation therapy 05/04/16 - 05/12/16   Left lung treated to 54 Gy with 3 fx of 18 Gy   History of radiation therapy    Left Lung- 08/02/22-08/17/22- Dr. Lynwood Nasuti   Hypertension    On home oxygen  therapy    2L; 24/7 (04/05/2017)    OSA (obstructive sleep apnea)    OSA on CPAP    Pneumonia 10/2015   at least 3 times (04/05/2017)   Pulmonary hypertension (HCC)     Tobacco History: Social History   Tobacco Use  Smoking Status Former   Current packs/day: 0.00   Average packs/day: 1 pack/day for 40.0 years (40.0 ttl pk-yrs)   Types: Cigarettes   Start date: 09/02/1974   Quit date: 09/02/2014   Years since quitting: 8.5  Smokeless Tobacco Never   Counseling given: Not Answered   Outpatient Medications Prior to Visit  Medication Sig Dispense Refill   acetaminophen  (TYLENOL ) 500 MG tablet Take 1 tablet (500 mg total) by mouth every 6 (six) hours as needed. (Patient taking differently: Take 1,000 mg by mouth in the morning and at bedtime.) 30 tablet 0   albuterol  (PROVENTIL ) (2.5 MG/3ML) 0.083% nebulizer solution USE 1 VIAL IN NEBULIZER EVERY 4 HOURS - And As Needed For Shortness of Breath (Patient taking differently: Take 2.5 mg by nebulization as needed for wheezing or shortness of breath.) 60 mL 11   albuterol  (VENTOLIN  HFA) 108 (90 Base) MCG/ACT inhaler Inhale 2 puffs into the lungs 2 (two) times daily as needed for wheezing or shortness of breath. 17 each 0   amLODipine  (NORVASC ) 5 MG tablet Take 5 mg by mouth daily.     ascorbic acid (VITAMIN C) 1000 MG tablet Take 1,000 mg by mouth daily.     aspirin  EC 81 MG tablet Take 81 mg by mouth daily. Swallow whole.     atorvastatin  (LIPITOR) 20 MG tablet Take 20 mg by mouth at bedtime.     busPIRone  (BUSPAR ) 10 MG tablet Take 1 tablet by mouth 2 (two) times daily.     cetirizine (ZYRTEC) 10 MG tablet Take 10 mg by mouth at bedtime.     cholecalciferol  (VITAMIN D3) 25 MCG (1000 UNIT) tablet Take 1,000 Units by mouth daily.     clopidogrel  (PLAVIX ) 75 MG tablet Take 75 mg by mouth every morning.      Cyanocobalamin (B-12 PO) Take 1 tablet by mouth daily.     ferrous sulfate  325 (65 FE) MG EC tablet Take 975 mg by mouth daily.     fluticasone  furoate-vilanterol (BREO  ELLIPTA) 100-25 MCG/ACT AEPB Inhale 1 puff into the lungs every morning. 28 each 11   JARDIANCE  25 MG TABS tablet Take 25 mg by mouth daily.     losartan  (COZAAR ) 50 MG tablet Take 50 mg by mouth every morning.     methocarbamol  (ROBAXIN ) 500 MG tablet Take 500 mg by mouth in the morning and at bedtime.     metoprolol  succinate (TOPROL -XL) 25 MG 24 hr tablet Take 25 mg by mouth daily.     nitroGLYCERIN  (NITRODUR - DOSED IN MG/24 HR) 0.4 mg/hr patch Place 0.4 mg onto the skin every other day.  3   nitroGLYCERIN  (NITROSTAT ) 0.4 MG SL tablet Place 0.4 mg under the tongue every 5 (five)  minutes as needed for chest pain.     omeprazole (PRILOSEC) 20 MG capsule Take 20 mg by mouth daily.     OXYGEN  Inhale 4-7 L into the lungs continuous.     polyethylene glycol powder (GLYCOLAX /MIRALAX ) 17 GM/SCOOP powder Take by mouth.     Polyvinyl Alcohol-Povidone (REFRESH OP) Place 1 drop into both eyes daily as needed (dry eyes).     predniSONE  (DELTASONE ) 10 MG tablet TAKE 1 TABLET BY MOUTH ONCE DAILY AS NEEDED (Patient taking differently: Take 10 mg by mouth daily. Takes 10 mg every other day,  and 5 mg every other day.  Alternate days) 100 tablet 1   senna-docusate (SENOKOT-S) 8.6-50 MG tablet Take 2 tablets by mouth daily.     No facility-administered medications prior to visit.     Review of Systems:   Constitutional: No weight loss or gain, night sweats, fevers, chills +fatigue, lassitude. HEENT: No headaches, difficulty swallowing, tooth/dental problems, or sore throat. No sneezing, itching, ear ache. +watery eyes, nasal drainage CV: +baseline swelling in lower extremities.  No chest pain, orthopnea, PND, anasarca, dizziness, palpitations, syncope Resp: +shortness of breath with exertion. No excess mucus or change in color of mucus. No productive or non-productive. No hemoptysis. No wheezing.  No chest wall deformity GI:  No heartburn, indigestion, abdominal pain, nausea, vomiting, diarrhea, change in  bowel habits, loss of appetite, bloody stools.  GU: No dysuria, change in color of urine, urgency or frequency.  No flank pain, no hematuria  Skin: No rash, lesions, ulcerations MSK:  No increased joint pain or swelling.   Neuro: No dizziness or lightheadedness.  Psych: No depression or anxiety. Mood stable.     Physical Exam:  BP 122/68 (BP Location: Right Arm, Patient Position: Sitting, Cuff Size: Large)   Pulse 88   Temp 97.7 F (36.5 C) (Oral)   Ht 5' 2 (1.575 m)   Wt 154 lb (69.9 kg)   SpO2 100% Comment: 5L continuous oxygen  (tank)  BMI 28.17 kg/m   GEN: Pleasant, interactive, chronically-ill appearing; obese; in no acute distress HEENT:  Normocephalic and atraumatic. EACs patent bilaterally. TM pearly gray with dull light reflex bilaterally; no erythema, edema or exudate. PERRLA. Sclera injected. Watery drainage. Nasal turbinates erythematous, moist and patent bilaterally. Clear rhinorrhea present. Oropharynx pink and moist, without exudate or edema. No lesions, ulcerations NECK:  Supple w/ fair ROM. No JVD present. Normal carotid impulses w/o bruits. Thyroid symmetrical with no goiter or nodules palpated. Submandibular adenopathy CV: RRR, no m/r/g, no peripheral edema. Pulses intact, +2 bilaterally. No cyanosis, pallor or clubbing. PULMONARY:  Unlabored, regular breathing. Diminished bilaterally A&P w/o wheezes/rales/rhonchi. No accessory muscle use.  GI: BS present and normoactive. Soft, non-tender to palpation. No organomegaly or masses detected.  MSK: No erythema, warmth or tenderness. Cap refil <2 sec all extrem. No deformities or joint swelling noted.  Neuro: A/Ox3. No focal deficits noted.   Skin: Warm, no lesions or rashe Psych: Normal affect and behavior. Judgement and thought content appropriate.     Lab Results:  CBC    Component Value Date/Time   WBC 8.7 10/21/2022 1014   WBC 13.1 (H) 08/15/2022 0256   RBC 3.36 (L) 10/21/2022 1014   HGB 10.7 (L) 10/21/2022  1014   HGB 9.0 (L) 08/13/2016 1120   HCT 35.3 (L) 10/21/2022 1014   HCT 30.1 (L) 08/13/2016 1120   PLT 157 10/21/2022 1014   PLT 175 08/13/2016 1120   MCV 105.1 (H) 10/21/2022 1014  MCV 79.1 (L) 08/13/2016 1120   MCH 31.8 10/21/2022 1014   MCHC 30.3 10/21/2022 1014   RDW 11.8 10/21/2022 1014   RDW 15.4 (H) 08/13/2016 1120   LYMPHSABS 1.6 10/21/2022 1014   LYMPHSABS 1.4 08/13/2016 1120   MONOABS 0.7 10/21/2022 1014   MONOABS 0.5 08/13/2016 1120   EOSABS 0.1 10/21/2022 1014   EOSABS 0.2 08/13/2016 1120   BASOSABS 0.0 10/21/2022 1014   BASOSABS 0.0 08/13/2016 1120    BMET    Component Value Date/Time   NA 143 10/21/2022 1014   NA 141 08/13/2016 1120   K 4.5 10/21/2022 1014   K 3.4 (L) 08/13/2016 1120   CL 103 10/21/2022 1014   CO2 34 (H) 10/21/2022 1014   CO2 25 08/13/2016 1120   GLUCOSE 129 (H) 10/21/2022 1014   GLUCOSE 123 08/13/2016 1120   BUN 35 (H) 10/21/2022 1014   BUN 15.8 08/13/2016 1120   CREATININE 1.40 (H) 10/21/2022 1014   CREATININE 1.1 08/13/2016 1120   CALCIUM  10.1 10/21/2022 1014   CALCIUM  9.1 08/13/2016 1120   GFRNONAA 39 (L) 10/21/2022 1014   GFRAA 53 (L) 02/26/2019 1106    BNP    Component Value Date/Time   BNP 100.4 (H) 08/13/2022 1629     Imaging:  DG Chest 2 View Result Date: 03/02/2023 CLINICAL DATA:  Increased shortness of breath EXAM: CHEST - 2 VIEW COMPARISON:  08/13/2022 FINDINGS: Heart size and pulmonary vascularity are normal. Postoperative changes and scarring in the left mid lung with prominent left hilar appearance. Developing infiltrates in the right lung base. This may represent pneumonia or atelectasis. No pleural effusion or pneumothorax. Calcified and tortuous aorta. Degenerative changes in the spine and shoulders. IMPRESSION: 1. Prominent left hilum, postoperative changes and scarring in the left mid lung appear unchanged. 2. Developing infiltration or atelectasis in the right lung base. Electronically Signed   By: Elsie Gravely M.D.   On: 03/02/2023 17:49    Administration History     None          Latest Ref Rng & Units 12/09/2014   10:55 AM  PFT Results  FVC-Pre L 1.29   FVC-Predicted Pre % 59   FVC-Post L 1.20   FVC-Predicted Post % 55   Pre FEV1/FVC % % 62   Post FEV1/FCV % % 62   FEV1-Pre L 0.80   FEV1-Predicted Pre % 47   FEV1-Post L 0.75   DLCO uncorrected ml/min/mmHg 5.18   DLCO UNC% % 24   DLVA Predicted % 48   TLC L 4.80   TLC % Predicted % 101   RV % Predicted % 158     No results found for: NITRICOXIDE      Assessment & Plan:   URI (upper respiratory infection) URI symptoms.  COVID and flu negative.  Supportive care measures advised. CXR today to rule out superimposed infection. Will treat her for possible AECOPD with prednisone  burst and then return to normal steroid dosing.   Patient Instructions  Continue Albuterol  inhaler 2 puffs or 3 mL neb every 6 hours as needed for shortness of breath or wheezing. Notify if symptoms persist despite rescue inhaler/neb use. Use your nebs 2-3 times a day until you feel better  Continue zyrtec 1 tab daily Continue Breo 1 puff daily. Brush tongue and rinse mouth afterwards Continue supplemental oxygen  4-6 lpm for goal >88-90%  Increase prednisone  to 20 mg for the next 5 days then go back to alternating between 10 mg  and 5 mg  Flonase  nasal spray 2 sprays each nostril daily Guaifenesin  600 mg Twice daily over the counter for congestion   Chest x ray today  Follow up in 3-4 weeks with Dr. Darlean or Izetta Malachy PIETY. If symptoms do not improve or worsen, please contact office for sooner follow up or seek emergency care.    COPD exacerbation (HCC) Possible AECOPD secondary to viral illness. No bronchospasm on exam. Will challenge her with higher dose steroid course. Continue current maintenance regimen. Action plan in place.   Chronic respiratory failure with hypoxia (HCC) Stable without increased O2 requirement. Goal  >88-90%   Advised if symptoms do not improve or worsen, to please contact office for sooner follow up or seek emergency care.   I spent 35 minutes of dedicated to the care of this patient on the date of this encounter to include pre-visit review of records, face-to-face time with the patient discussing conditions above, post visit ordering of testing, clinical documentation with the electronic health record, making appropriate referrals as documented, and communicating necessary findings to members of the patients care team.  Comer LULLA Malachy, NP 03/03/2023  Pt aware and understands NP's role.

## 2023-03-02 NOTE — Patient Instructions (Addendum)
 Continue Albuterol  inhaler 2 puffs or 3 mL neb every 6 hours as needed for shortness of breath or wheezing. Notify if symptoms persist despite rescue inhaler/neb use. Use your nebs 2-3 times a day until you feel better  Continue zyrtec 1 tab daily Continue Breo 1 puff daily. Brush tongue and rinse mouth afterwards Continue supplemental oxygen  4-6 lpm for goal >88-90%  Increase prednisone  to 20 mg for the next 5 days then go back to alternating between 10 mg and 5 mg  Flonase  nasal spray 2 sprays each nostril daily Guaifenesin  600 mg Twice daily over the counter for congestion   Chest x ray today  Follow up in 3-4 weeks with Suzanne Dickson or Suzanne Dickson. If symptoms do not improve or worsen, please contact office for sooner follow up or seek emergency care.

## 2023-03-03 ENCOUNTER — Other Ambulatory Visit: Payer: Self-pay | Admitting: Nurse Practitioner

## 2023-03-03 ENCOUNTER — Encounter: Payer: Self-pay | Admitting: Nurse Practitioner

## 2023-03-03 DIAGNOSIS — J189 Pneumonia, unspecified organism: Secondary | ICD-10-CM

## 2023-03-03 DIAGNOSIS — J069 Acute upper respiratory infection, unspecified: Secondary | ICD-10-CM | POA: Insufficient documentation

## 2023-03-03 MED ORDER — AMOXICILLIN-POT CLAVULANATE 875-125 MG PO TABS: 1.0000 | ORAL_TABLET | Freq: Two times a day (BID) | ORAL | 0 refills | Status: AC

## 2023-03-03 NOTE — Assessment & Plan Note (Signed)
 Possible AECOPD secondary to viral illness. No bronchospasm on exam. Will challenge her with higher dose steroid course. Continue current maintenance regimen. Action plan in place.

## 2023-03-03 NOTE — Assessment & Plan Note (Signed)
 Stable without increased O2 requirement. Goal >88-90%

## 2023-03-03 NOTE — Progress Notes (Signed)
 03/03/2023 Addendum: CXR with developing infiltrate in right lung base. Started on empiric augmentin  for 7 days.

## 2023-03-03 NOTE — Progress Notes (Signed)
 I called the pt and there was no answer- LMTCB. ?

## 2023-03-03 NOTE — Assessment & Plan Note (Signed)
 URI symptoms.  COVID and flu negative.  Supportive care measures advised. CXR today to rule out superimposed infection. Will treat her for possible AECOPD with prednisone  burst and then return to normal steroid dosing.   Patient Instructions  Continue Albuterol  inhaler 2 puffs or 3 mL neb every 6 hours as needed for shortness of breath or wheezing. Notify if symptoms persist despite rescue inhaler/neb use. Use your nebs 2-3 times a day until you feel better  Continue zyrtec 1 tab daily Continue Breo 1 puff daily. Brush tongue and rinse mouth afterwards Continue supplemental oxygen  4-6 lpm for goal >88-90%  Increase prednisone  to 20 mg for the next 5 days then go back to alternating between 10 mg and 5 mg  Flonase  nasal spray 2 sprays each nostril daily Guaifenesin  600 mg Twice daily over the counter for congestion   Chest x ray today  Follow up in 3-4 weeks with Suzanne Dickson. If symptoms do not improve or worsen, please contact office for sooner follow up or seek emergency care.

## 2023-03-09 DIAGNOSIS — H25813 Combined forms of age-related cataract, bilateral: Secondary | ICD-10-CM | POA: Diagnosis not present

## 2023-03-22 ENCOUNTER — Other Ambulatory Visit: Payer: Self-pay | Admitting: Nurse Practitioner

## 2023-03-22 DIAGNOSIS — J189 Pneumonia, unspecified organism: Secondary | ICD-10-CM

## 2023-03-22 DIAGNOSIS — J42 Unspecified chronic bronchitis: Secondary | ICD-10-CM | POA: Diagnosis not present

## 2023-03-22 DIAGNOSIS — I10 Essential (primary) hypertension: Secondary | ICD-10-CM | POA: Diagnosis not present

## 2023-03-22 NOTE — Progress Notes (Signed)
 Pt aware of results. Has already finished her abx. I scheduled rov with cxr. She will call sooner if needed.

## 2023-03-30 ENCOUNTER — Ambulatory Visit: Payer: Medicare PPO | Admitting: Nurse Practitioner

## 2023-03-30 ENCOUNTER — Ambulatory Visit (INDEPENDENT_AMBULATORY_CARE_PROVIDER_SITE_OTHER)

## 2023-03-30 ENCOUNTER — Encounter: Payer: Self-pay | Admitting: Nurse Practitioner

## 2023-03-30 VITALS — BP 140/56 | HR 98 | Temp 98.4°F | Ht 62.0 in | Wt 152.0 lb

## 2023-03-30 DIAGNOSIS — C3492 Malignant neoplasm of unspecified part of left bronchus or lung: Secondary | ICD-10-CM | POA: Diagnosis not present

## 2023-03-30 DIAGNOSIS — J9611 Chronic respiratory failure with hypoxia: Secondary | ICD-10-CM | POA: Diagnosis not present

## 2023-03-30 DIAGNOSIS — Z85118 Personal history of other malignant neoplasm of bronchus and lung: Secondary | ICD-10-CM | POA: Diagnosis not present

## 2023-03-30 DIAGNOSIS — J441 Chronic obstructive pulmonary disease with (acute) exacerbation: Secondary | ICD-10-CM

## 2023-03-30 DIAGNOSIS — J189 Pneumonia, unspecified organism: Secondary | ICD-10-CM

## 2023-03-30 DIAGNOSIS — J9811 Atelectasis: Secondary | ICD-10-CM | POA: Diagnosis not present

## 2023-03-30 NOTE — Patient Instructions (Addendum)
 Continue Albuterol inhaler 2 puffs or 3 mL neb every 6 hours as needed for shortness of breath or wheezing. Notify if symptoms persist despite rescue inhaler/neb use.  Continue zyrtec 1 tab daily Continue Breo 1 puff daily. Brush tongue and rinse mouth afterwards Continue Flonase nasal spray 2 sprays each nostril daily Continue Guaifenesin 600 mg Twice daily over the counter for congestion  Continue supplemental oxygen 4-6 lpm for goal >88-90%   Decrease prednisone to 15 mg for the next 7 days then go back to alternating between 15 mg and 10 mg for a week then go back to your normal dosing alternating 10 mg and 5 mg if you're still doing well   Attend CT chest later this month    Follow up in 6 weeks with Dr. Sherene Sires (1st) or Katie Oscar Forman,NP. If symptoms do not improve or worsen, please contact office for sooner follow up or seek emergency care.

## 2023-03-30 NOTE — Assessment & Plan Note (Signed)
 S/p SBRT. Upcoming surveillance CT. Follow up with oncology as scheduled

## 2023-03-30 NOTE — Assessment & Plan Note (Addendum)
 Completed empiric augmentin. Clinically improving. Repeat CXR was completed today; possibly too early for full resolution as she's been off abx for only 3 weeks now. If persistent on imaging, will continue to monitor clinically and evaluate for resolution on upcoming CT chest. Strict return precautions.

## 2023-03-30 NOTE — Progress Notes (Signed)
 @Patient  ID: Suzanne Dickson, female    DOB: Mar 07, 1945, 78 y.o.   MRN: [id number]  Chief Complaint  Patient presents with   Follow-up    Breathing is better.  On 5L of oxygen.  Still gets a pain in her back up to her shoulder, since her infection.    Referring provider: Dot Been, FNP  HPI: 78 year old female, former smoker followed for COPD mixed type and chronic respiratory failure on supplemental oxygen.  She is a patient of Dr. Thurston Hole and last seen in office 09/28/2022.  Past medical history significant for CAD, hypertension, PAH, OSA on CPAP, IDA, history of prolonged QT.  She has a history of NSCLC stage Ia status post SBRT.  TEST/EVENTS:  12/09/2014 PFT: FVC 59, FEV1 47, ratio 62, TLC 101, DLCO 24 10/21/2022 CT chest: Atherosclerosis/CAD.  Posttreatment scarring in the left hilum.  Emphysema.  Similar fibrosis/scarring in left lung.  15 mm nodular component measured 15 mm previously is now 11 mm.  Chronic linear atelectasis or scarring.  6 mm left upper lobe nodule unchanged.  No suspicious nodules or masses.  Tiny low-density lesion in the left liver, with benign etiology. 03/02/2023 CXR: prominent left hilum, postoperative changes and scarring mid lung; developing infiltrate in right lung base  09/28/2022: OV with Dr. Sherene Sires.  Maintained on Trelegy and chronic prednisone alternating 10 and 5 mg.  Uses supplemental oxygen 4 L/min at night and up to 6 L/min walking.  Sleeps with a wedge pillow.  Having some constipation with Trelegy.  Recommend changed to [facility].  03/02/2023: Suzanne Dickson with Suzanne Vester NP Discussed the use of AI scribe software for clinical note transcription with the patient, who gave verbal consent to proceed. Suzanne Dickson is a 78 year old female who presents with fatigue, eye watering, and shortness of breath. She experiences shortness of breath over the last couple of days, particularly when getting into bed, but it resolves after calming herself. No wheezing or hemoptysis. Her  feet swell slightly, which she considers normal, and there is no new or worsening swelling in her legs.  She has fatigue and feels run down over the past couple of days. Her eyes have been watering more. She feels weak and tired. No cough, but her nose runs frequently, which is not abnormal for her. She denies fevers, chills, hemoptysis, orthopnea.  She is currently taking prednisone, alternating between 10 mg one day and 5 mg the next. She monitors her blood sugar daily, with recent readings around 115 mg/dL, and has not experienced any hypoglycemic episodes. She typically uses 5 liters of oxygen, adjusting between 4 and 6 liters as needed, and notes that her oxygen levels have been stable.   03/30/2023: Today - follow up Patient presents today for follow up. She was treated for RLL pneumonia and AECOPD at our last visit with agumentin x 7 days. She tells me she is feeling much better today. She still has some more shortness of breath with activities than her baseline but this is slowly improving. She's able to get into the bed without any issues now. Same with her energy levels; getting back to baseline. Not having any cough, wheezing, chest congestion, fevers, chills, hemoptysis. She is still taking the 20 mg of prednisone. Felt like when she went back to the 10 mg alternating with 5 mg that she had more trouble. She's not tried reducing again. She's using her Breo daily. Hasn't had to use her albuterol much over the past few days.  No increased oxygen requirements.  Has upcoming CT chest with oncology.   No Known Allergies  Immunization History  Administered Date(s) Administered   Fluad Quad(high Dose 65+) 12/26/2018, 08/24/2022   Influenza, High Dose Seasonal PF 11/17/2016, 10/19/2017   Influenza,inj,Quad PF,6+ Mos 10/28/2014, 09/22/2015, 12/26/2018   Influenza-Unspecified 11/09/2019   PFIZER Comirnaty(Gray Top)Covid-19 Tri-Sucrose Vaccine 08/30/2022   PFIZER(Purple Top)SARS-COV-2 Vaccination  02/07/2019, 03/06/2019, 11/09/2019   Pneumococcal Conjugate-13 09/26/2015   Pneumococcal Polysaccharide-23 06/04/2009   Zoster Recombinant(Shingrix) 08/24/2022    Past Medical History:  Diagnosis Date   Adenocarcinoma of left lung, 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   "I'm in remission" (04/05/2017)   Adenocarcinoma, lung (HCC)    Anxiety    Arthritis    "legs, elbows" (04/05/2017)   Bronchitis, chronic (HCC)    "haven't had it in awhile" (04/05/2017)   CHF (congestive heart failure) (HCC)    COPD (chronic obstructive pulmonary disease) (HCC)    Coronary artery disease    Daily headache    "recently" (04/05/2017)   Depression    Dyspnea    with exertion   GERD (gastroesophageal reflux disease)    History of blood transfusion 1950s   History of radiation therapy 05/04/16 - 05/12/16   Left lung treated to 54 Gy with 3 fx of 18 Gy   History of radiation therapy    Left Lung- 08/02/22-08/17/22- Dr. Antony Blackbird   Hypertension    On home oxygen therapy    "2L; 24/7" (04/05/2017)   OSA (obstructive sleep apnea)    OSA on CPAP    Pneumonia 10/2015   "at least 3 times" (04/05/2017)   Pulmonary hypertension (HCC)     Tobacco History: Social History   Tobacco Use  Smoking Status Former   Current packs/day: 0.00   Average packs/day: 1 pack/day for 40.0 years (40.0 ttl pk-yrs)   Types: Cigarettes   Start date: 09/02/1974   Quit date: 09/02/2014   Years since quitting: 8.5  Smokeless Tobacco Never   Counseling given: Not Answered   Outpatient Medications Prior to Visit  Medication Sig Dispense Refill   acetaminophen (TYLENOL) 500 MG tablet Take 1 tablet (500 mg total) by mouth every 6 (six) hours as needed. (Patient taking differently: Take 1,000 mg by mouth in the morning and at bedtime.) 30 tablet 0   albuterol (PROVENTIL) (2.5 MG/3ML) 0.083% nebulizer solution USE 1 VIAL IN NEBULIZER EVERY 4 HOURS - And As Needed For Shortness of Breath (Patient taking differently: Take 2.5 mg by  nebulization as needed for wheezing or shortness of breath.) 60 mL 11   albuterol (VENTOLIN HFA) 108 (90 Base) MCG/ACT inhaler Inhale 2 puffs into the lungs 2 (two) times daily as needed for wheezing or shortness of breath. 17 each 0   amLODipine (NORVASC) 5 MG tablet Take 5 mg by mouth daily.     ascorbic acid (VITAMIN C) 1000 MG tablet Take 1,000 mg by mouth daily.     aspirin EC 81 MG tablet Take 81 mg by mouth daily. Swallow whole.     atorvastatin (LIPITOR) 20 MG tablet Take 20 mg by mouth at bedtime.     busPIRone (BUSPAR) 10 MG tablet Take 1 tablet by mouth 2 (two) times daily.     cetirizine (ZYRTEC) 10 MG tablet Take 10 mg by mouth at bedtime.     cholecalciferol (VITAMIN D3) 25 MCG (1000 UNIT) tablet Take 1,000 Units by mouth daily.     clopidogrel (PLAVIX) 75 MG tablet Take 75 mg by  mouth every morning.      Cyanocobalamin (B-12 PO) Take 1 tablet by mouth daily.     ferrous sulfate 325 (65 FE) MG EC tablet Take 975 mg by mouth daily.     fluticasone furoate-vilanterol (BREO ELLIPTA) 100-25 MCG/ACT AEPB Inhale 1 puff into the lungs every morning. 28 each 11   JARDIANCE 25 MG TABS tablet Take 25 mg by mouth daily.     losartan (COZAAR) 50 MG tablet Take 50 mg by mouth every morning.     methocarbamol (ROBAXIN) 500 MG tablet Take 500 mg by mouth in the morning and at bedtime.     metoprolol succinate (TOPROL-XL) 25 MG 24 hr tablet Take 25 mg by mouth daily.     omeprazole (PRILOSEC) 20 MG capsule Take 20 mg by mouth daily.     OXYGEN Inhale 4-7 L into the lungs continuous.     polyethylene glycol powder (GLYCOLAX/MIRALAX) 17 GM/SCOOP powder Take by mouth.     predniSONE (DELTASONE) 10 MG tablet TAKE 1 TABLET BY MOUTH ONCE DAILY AS NEEDED (Patient taking differently: Take 10 mg by mouth daily. Takes 10 mg every other day,  and 5 mg every other day.  Alternate days Took taper, started feeling bad and went back to the 20mg .) 100 tablet 1   senna-docusate (SENOKOT-S) 8.6-50 MG tablet Take 2  tablets by mouth daily.     fluticasone (FLONASE) 50 MCG/ACT nasal spray Place 2 sprays into both nostrils daily. (Patient not taking: Reported on 03/30/2023) 18.2 mL 2   nitroGLYCERIN (NITRODUR - DOSED IN MG/24 HR) 0.4 mg/hr patch Place 0.4 mg onto the skin every other day. (Patient not taking: Reported on 03/30/2023)  3   nitroGLYCERIN (NITROSTAT) 0.4 MG SL tablet Place 0.4 mg under the tongue every 5 (five) minutes as needed for chest pain. (Patient not taking: Reported on 03/30/2023)     Polyvinyl Alcohol-Povidone (REFRESH OP) Place 1 drop into both eyes daily as needed (dry eyes). (Patient not taking: Reported on 03/30/2023)     No facility-administered medications prior to visit.     Review of Systems:   Constitutional: No weight loss or gain, night sweats, fevers, chills +fatigue, lassitude (improved) HEENT: No headaches, difficulty swallowing, tooth/dental problems, or sore throat. No sneezing, itching, ear ache, nasal drainage CV: +baseline swelling in lower extremities.  No chest pain, orthopnea, PND, anasarca, dizziness, palpitations, syncope Resp: +shortness of breath with exertion (improved). No excess mucus or change in color of mucus. No productive or non-productive. No hemoptysis. No wheezing.  No chest wall deformity GI:  No heartburn, indigestion, abdominal pain, nausea, vomiting, diarrhea, change in bowel habits, loss of appetite, bloody stools.  GU: No dysuria, change in color of urine, urgency or frequency.   Skin: No rash, lesions, ulcerations MSK:  No increased joint pain or swelling.   Neuro: No dizziness or lightheadedness.  Psych: No depression or anxiety. Mood stable.     Physical Exam:  BP (!) 140/56 (BP Location: Left Arm, Cuff Size: Large)   Pulse 98   Temp 98.4 F (36.9 C) (Oral)   Ht 5\' 2"  (1.575 m)   Wt 152 lb (68.9 kg)   SpO2 93%   BMI 27.80 kg/m   GEN: Pleasant, interactive, chronically-ill appearing; obese; in no acute distress HEENT:  Normocephalic  and atraumatic. EACs patent bilaterally. TM pearly gray with present light reflex bilaterally. PERRLA. Sclera injected. Nasal turbinates pink, moist and patent bilaterally. No rhinorrhea present. Oropharynx pink and moist, without exudate  or edema. No lesions, ulcerations NECK:  Supple w/ fair ROM. No JVD present. Normal carotid impulses w/o bruits. Thyroid symmetrical with no goiter or nodules palpated. No adenopathy CV: RRR, no m/r/g, no peripheral edema. Pulses intact, +2 bilaterally. No cyanosis, pallor or clubbing. PULMONARY:  Unlabored, regular breathing. Diminished bibasilar airflow otherwise clear A&P w/o wheezes/rales/rhonchi. No accessory muscle use.  GI: BS present and normoactive. Soft, non-tender to palpation. No organomegaly or masses detected.  MSK: No erythema, warmth or tenderness. Cap refil <2 sec all extrem. No deformities or joint swelling noted.  Neuro: A/Ox3. No focal deficits noted.   Skin: Warm, no lesions or rashe Psych: Normal affect and behavior. Judgement and thought content appropriate.     Lab Results:  CBC    Component Value Date/Time   WBC 8.7 10/21/2022 1014   WBC 13.1 (H) 08/15/2022 0256   RBC 3.36 (L) 10/21/2022 1014   HGB 10.7 (L) 10/21/2022 1014   HGB 9.0 (L) 08/13/2016 1120   HCT 35.3 (L) 10/21/2022 1014   HCT 30.1 (L) 08/13/2016 1120   PLT 157 10/21/2022 1014   PLT 175 08/13/2016 1120   MCV 105.1 (H) 10/21/2022 1014   MCV 79.1 (L) 08/13/2016 1120   MCH 31.8 10/21/2022 1014   MCHC 30.3 10/21/2022 1014   RDW 11.8 10/21/2022 1014   RDW 15.4 (H) 08/13/2016 1120   LYMPHSABS 1.6 10/21/2022 1014   LYMPHSABS 1.4 08/13/2016 1120   MONOABS 0.7 10/21/2022 1014   MONOABS 0.5 08/13/2016 1120   EOSABS 0.1 10/21/2022 1014   EOSABS 0.2 08/13/2016 1120   BASOSABS 0.0 10/21/2022 1014   BASOSABS 0.0 08/13/2016 1120    BMET    Component Value Date/Time   NA 143 10/21/2022 1014   NA 141 08/13/2016 1120   K 4.5 10/21/2022 1014   K 3.4 (L) 08/13/2016  1120   CL 103 10/21/2022 1014   CO2 34 (H) 10/21/2022 1014   CO2 25 08/13/2016 1120   GLUCOSE 129 (H) 10/21/2022 1014   GLUCOSE 123 08/13/2016 1120   BUN 35 (H) 10/21/2022 1014   BUN 15.8 08/13/2016 1120   CREATININE 1.40 (H) 10/21/2022 1014   CREATININE 1.1 08/13/2016 1120   CALCIUM 10.1 10/21/2022 1014   CALCIUM 9.1 08/13/2016 1120   GFRNONAA 39 (L) 10/21/2022 1014   GFRAA 53 (L) 02/26/2019 1106    BNP    Component Value Date/Time   BNP 100.4 (H) 08/13/2022 1629     Imaging:  DG Chest 2 View Result Date: 03/02/2023 CLINICAL DATA:  Increased shortness of breath EXAM: CHEST - 2 VIEW COMPARISON:  08/13/2022 FINDINGS: Heart size and pulmonary vascularity are normal. Postoperative changes and scarring in the left mid lung with prominent left hilar appearance. Developing infiltrates in the right lung base. This may represent pneumonia or atelectasis. No pleural effusion or pneumothorax. Calcified and tortuous aorta. Degenerative changes in the spine and shoulders. IMPRESSION: 1. Prominent left hilum, postoperative changes and scarring in the left mid lung appear unchanged. 2. Developing infiltration or atelectasis in the right lung base. Electronically Signed   By: Burman Nieves M.D.   On: 03/02/2023 17:49    Administration History     None          Latest Ref Rng & Units 12/09/2014   10:55 AM  PFT Results  FVC-Pre L 1.29   FVC-Predicted Pre % 59   FVC-Post L 1.20   FVC-Predicted Post % 55   Pre FEV1/FVC % % 62  Post FEV1/FCV % % 62   FEV1-Pre L 0.80   FEV1-Predicted Pre % 47   FEV1-Post L 0.75   DLCO uncorrected ml/min/mmHg 5.18   DLCO UNC% % 24   DLVA Predicted % 48   TLC L 4.80   TLC % Predicted % 101   RV % Predicted % 158     No results found for: "NITRICOXIDE"      Assessment & Plan:   COPD exacerbation (HCC) Resolving AECOPD. Clinically improving. Advised her to taper down prednisone until she is back on her alternating dose of 10 mg and 5 mg.  Continue current bronchodilator regimen. Had difficulties tolerating LAMA therapy in the past. Action plan in place.  Patient Instructions  Continue Albuterol inhaler 2 puffs or 3 mL neb every 6 hours as needed for shortness of breath or wheezing. Notify if symptoms persist despite rescue inhaler/neb use.  Continue zyrtec 1 tab daily Continue Breo 1 puff daily. Brush tongue and rinse mouth afterwards Continue Flonase nasal spray 2 sprays each nostril daily Continue Guaifenesin 600 mg Twice daily over the counter for congestion  Continue supplemental oxygen 4-6 lpm for goal >88-90%   Decrease prednisone to 15 mg for the next 7 days then go back to alternating between 15 mg and 10 mg for a week then go back to your normal dosing alternating 10 mg and 5 mg if you're still doing well   Attend CT chest later this month    Follow up in 6 weeks with Dr. Sherene Sires (1st) or Katie Lavilla Delamora,NP. If symptoms do not improve or worsen, please contact office for sooner follow up or seek emergency care.   CAP (community acquired pneumonia) Completed empiric augmentin. Clinically improving. Repeat CXR was completed today; possibly too early for full resolution as she's been off abx for only 3 weeks now. If persistent on imaging, will continue to monitor clinically and evaluate for resolution on upcoming CT chest. Strict return precautions.   Chronic respiratory failure with hypoxia (HCC) Stable without increased O2 requirement. Goal >88-90%  Adenocarcinoma of left lung, stage 1 (HCC) S/p SBRT. Upcoming surveillance CT. Follow up with oncology as scheduled     Advised if symptoms do not improve or worsen, to please contact office for sooner follow up or seek emergency care.   I spent 35 minutes of dedicated to the care of this patient on the date of this encounter to include pre-visit review of records, face-to-face time with the patient discussing conditions above, post visit ordering of testing, clinical  documentation with the electronic health record, making appropriate referrals as documented, and communicating necessary findings to members of the patients care team.  Noemi Chapel, NP 03/30/2023  Pt aware and understands NP's role.

## 2023-03-30 NOTE — Assessment & Plan Note (Signed)
 Resolving AECOPD. Clinically improving. Advised her to taper down prednisone until she is back on her alternating dose of 10 mg and 5 mg. Continue current bronchodilator regimen. Had difficulties tolerating LAMA therapy in the past. Action plan in place.  Patient Instructions  Continue Albuterol inhaler 2 puffs or 3 mL neb every 6 hours as needed for shortness of breath or wheezing. Notify if symptoms persist despite rescue inhaler/neb use.  Continue zyrtec 1 tab daily Continue Breo 1 puff daily. Brush tongue and rinse mouth afterwards Continue Flonase nasal spray 2 sprays each nostril daily Continue Guaifenesin 600 mg Twice daily over the counter for congestion  Continue supplemental oxygen 4-6 lpm for goal >88-90%   Decrease prednisone to 15 mg for the next 7 days then go back to alternating between 15 mg and 10 mg for a week then go back to your normal dosing alternating 10 mg and 5 mg if you're still doing well   Attend CT chest later this month    Follow up in 6 weeks with Dr. Sherene Sires (1st) or Katie Waneda Klammer,NP. If symptoms do not improve or worsen, please contact office for sooner follow up or seek emergency care.

## 2023-03-30 NOTE — Assessment & Plan Note (Signed)
 Stable without increased O2 requirement. Goal >88-90%

## 2023-04-15 ENCOUNTER — Inpatient Hospital Stay: Payer: Medicare PPO | Attending: Internal Medicine

## 2023-04-15 ENCOUNTER — Ambulatory Visit (HOSPITAL_COMMUNITY)
Admission: RE | Admit: 2023-04-15 | Discharge: 2023-04-15 | Disposition: A | Discharge status: Home / Self Care | Payer: Medicare PPO | Source: Ambulatory Visit | Attending: Internal Medicine | Admitting: Internal Medicine

## 2023-04-15 DIAGNOSIS — Z7902 Long term (current) use of antithrombotics/antiplatelets: Secondary | ICD-10-CM | POA: Insufficient documentation

## 2023-04-15 DIAGNOSIS — Z923 Personal history of irradiation: Secondary | ICD-10-CM | POA: Diagnosis not present

## 2023-04-15 DIAGNOSIS — G8929 Other chronic pain: Secondary | ICD-10-CM | POA: Diagnosis not present

## 2023-04-15 DIAGNOSIS — Z9049 Acquired absence of other specified parts of digestive tract: Secondary | ICD-10-CM | POA: Insufficient documentation

## 2023-04-15 DIAGNOSIS — Z9981 Dependence on supplemental oxygen: Secondary | ICD-10-CM | POA: Insufficient documentation

## 2023-04-15 DIAGNOSIS — I10 Essential (primary) hypertension: Secondary | ICD-10-CM | POA: Diagnosis not present

## 2023-04-15 DIAGNOSIS — C3490 Malignant neoplasm of unspecified part of unspecified bronchus or lung: Secondary | ICD-10-CM

## 2023-04-15 DIAGNOSIS — I700 Atherosclerosis of aorta: Secondary | ICD-10-CM | POA: Insufficient documentation

## 2023-04-15 DIAGNOSIS — Z9089 Acquired absence of other organs: Secondary | ICD-10-CM | POA: Insufficient documentation

## 2023-04-15 DIAGNOSIS — J439 Emphysema, unspecified: Secondary | ICD-10-CM | POA: Diagnosis not present

## 2023-04-15 DIAGNOSIS — F419 Anxiety disorder, unspecified: Secondary | ICD-10-CM | POA: Diagnosis not present

## 2023-04-15 DIAGNOSIS — J440 Chronic obstructive pulmonary disease with acute lower respiratory infection: Secondary | ICD-10-CM | POA: Diagnosis not present

## 2023-04-15 DIAGNOSIS — C3412 Malignant neoplasm of upper lobe, left bronchus or lung: Secondary | ICD-10-CM | POA: Insufficient documentation

## 2023-04-15 DIAGNOSIS — M549 Dorsalgia, unspecified: Secondary | ICD-10-CM | POA: Insufficient documentation

## 2023-04-15 DIAGNOSIS — J432 Centrilobular emphysema: Secondary | ICD-10-CM | POA: Diagnosis not present

## 2023-04-15 DIAGNOSIS — M954 Acquired deformity of chest and rib: Secondary | ICD-10-CM | POA: Insufficient documentation

## 2023-04-15 DIAGNOSIS — Z79899 Other long term (current) drug therapy: Secondary | ICD-10-CM | POA: Diagnosis not present

## 2023-04-15 DIAGNOSIS — Z87440 Personal history of urinary (tract) infections: Secondary | ICD-10-CM | POA: Diagnosis not present

## 2023-04-15 LAB — CBC WITH DIFFERENTIAL (CANCER CENTER ONLY)
Abs Immature Granulocytes: 0.04 10*3/uL (ref 0.00–0.07)
Basophils Absolute: 0 10*3/uL (ref 0.0–0.1)
Basophils Relative: 0 %
Eosinophils Absolute: 0.1 10*3/uL (ref 0.0–0.5)
Eosinophils Relative: 1 %
HCT: 37.9 % (ref 36.0–46.0)
Hemoglobin: 11.4 g/dL — ABNORMAL LOW (ref 12.0–15.0)
Immature Granulocytes: 1 %
Lymphocytes Relative: 15 %
Lymphs Abs: 1.1 10*3/uL (ref 0.7–4.0)
MCH: 32.3 pg (ref 26.0–34.0)
MCHC: 30.1 g/dL (ref 30.0–36.0)
MCV: 107.4 fL — ABNORMAL HIGH (ref 80.0–100.0)
Monocytes Absolute: 0.6 10*3/uL (ref 0.1–1.0)
Monocytes Relative: 7 %
Neutro Abs: 5.7 10*3/uL (ref 1.7–7.7)
Neutrophils Relative %: 76 %
Platelet Count: 113 10*3/uL — ABNORMAL LOW (ref 150–400)
RBC: 3.53 MIL/uL — ABNORMAL LOW (ref 3.87–5.11)
RDW: 11.8 % (ref 11.5–15.5)
WBC Count: 7.5 10*3/uL (ref 4.0–10.5)
nRBC: 0 % (ref 0.0–0.2)

## 2023-04-15 LAB — CMP (CANCER CENTER ONLY)
ALT: 17 U/L (ref 0–44)
AST: 15 U/L (ref 15–41)
Albumin: 4.5 g/dL (ref 3.5–5.0)
Alkaline Phosphatase: 44 U/L (ref 38–126)
Anion gap: 7 (ref 5–15)
BUN: 26 mg/dL — ABNORMAL HIGH (ref 8–23)
CO2: 36 mmol/L — ABNORMAL HIGH (ref 22–32)
Calcium: 10.1 mg/dL (ref 8.9–10.3)
Chloride: 102 mmol/L (ref 98–111)
Creatinine: 1.15 mg/dL — ABNORMAL HIGH (ref 0.44–1.00)
GFR, Estimated: 49 mL/min — ABNORMAL LOW (ref >60)
Glucose, Bld: 114 mg/dL — ABNORMAL HIGH (ref 70–99)
Potassium: 4.5 mmol/L (ref 3.5–5.1)
Sodium: 145 mmol/L (ref 135–145)
Total Bilirubin: 0.6 mg/dL (ref 0.0–1.2)
Total Protein: 7.2 g/dL (ref 6.5–8.1)

## 2023-04-18 DIAGNOSIS — H25813 Combined forms of age-related cataract, bilateral: Secondary | ICD-10-CM | POA: Diagnosis not present

## 2023-04-19 DIAGNOSIS — I10 Essential (primary) hypertension: Secondary | ICD-10-CM | POA: Diagnosis not present

## 2023-04-19 DIAGNOSIS — J42 Unspecified chronic bronchitis: Secondary | ICD-10-CM | POA: Diagnosis not present

## 2023-04-21 ENCOUNTER — Inpatient Hospital Stay: Payer: Medicare PPO | Admitting: Internal Medicine

## 2023-04-21 VITALS — BP 180/73 | HR 95 | Temp 97.6°F | Resp 18 | Ht 62.0 in

## 2023-04-21 DIAGNOSIS — I700 Atherosclerosis of aorta: Secondary | ICD-10-CM | POA: Diagnosis not present

## 2023-04-21 DIAGNOSIS — C3412 Malignant neoplasm of upper lobe, left bronchus or lung: Secondary | ICD-10-CM | POA: Diagnosis not present

## 2023-04-21 DIAGNOSIS — C3490 Malignant neoplasm of unspecified part of unspecified bronchus or lung: Secondary | ICD-10-CM | POA: Diagnosis not present

## 2023-04-21 DIAGNOSIS — I10 Essential (primary) hypertension: Secondary | ICD-10-CM | POA: Diagnosis not present

## 2023-04-21 DIAGNOSIS — J440 Chronic obstructive pulmonary disease with acute lower respiratory infection: Secondary | ICD-10-CM | POA: Diagnosis not present

## 2023-04-21 DIAGNOSIS — F419 Anxiety disorder, unspecified: Secondary | ICD-10-CM | POA: Diagnosis not present

## 2023-04-21 DIAGNOSIS — M954 Acquired deformity of chest and rib: Secondary | ICD-10-CM | POA: Diagnosis not present

## 2023-04-21 DIAGNOSIS — G8929 Other chronic pain: Secondary | ICD-10-CM | POA: Diagnosis not present

## 2023-04-21 DIAGNOSIS — M549 Dorsalgia, unspecified: Secondary | ICD-10-CM | POA: Diagnosis not present

## 2023-04-21 DIAGNOSIS — J439 Emphysema, unspecified: Secondary | ICD-10-CM | POA: Diagnosis not present

## 2023-04-21 NOTE — Progress Notes (Signed)
 [facility] [phone]   [phone]  OFFICE PROGRESS NOTE  Dot Been, FNP [address] [place] [address]  DIAGNOSIS: Stage IA (T1a, N0, M0) non-small cell lung cancer, adenocarcinoma presented with left upper lobe lung nodule diagnosed in March 2018.  PRIOR THERAPY:  1) S/P RT to the left lung nodule under the care of Dr. Roselind Messier completed on 05/04/2016. 2) status post curative radiotherapy to the posterior left upper lobe lung nodule under the care of Dr. Roselind Messier completed on August 17, 2022  CURRENT THERAPY: Observation.  INTERVAL HISTORY: Suzanne Dickson 78 y.o. female returns to the clinic today for follow-up visit accompanied by her husband.Discussed the use of AI scribe software for clinical note transcription with the patient, who gave verbal consent to proceed.  History of Present Illness   Suzanne Dickson is a 78 year old female with stage 1A non-small cell lung cancer who presents for evaluation with a repeat CT scan of the chest.  She has a history of stage 1A non-small cell lung cancer diagnosed in March 2018. She underwent stereotactic body radiation therapy (SBRT) to a left lung nodule at that time. In July 2024, she experienced a recurrence in the left upper lobe, which was treated with radiation. No chest pain, hemoptysis, or significant weight loss since her last visit.  She experiences back pain due to a compound fracture that healed incorrectly, possibly causing nerve compression. The pain is described as a flare-up, with spasms occurring when lying down and relief when sitting up. Previous treatment with injections was interrupted due to a urinary tract infection.  Her blood pressure was noted to be elevated at 177/76, which she attributes to being 'worked up' from moving around and her back pain. She occasionally checks her blood pressure at home and notes it is not usually this high. She also mentions difficulty  resting due to her back pain.  She is currently on home oxygen therapy.       MEDICAL HISTORY: Past Medical History:  Diagnosis Date   Adenocarcinoma of left lung, 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   "I'm in remission" (04/05/2017)   Adenocarcinoma, lung (HCC)    Anxiety    Arthritis    "legs, elbows" (04/05/2017)   Bronchitis, chronic (HCC)    "haven't had it in awhile" (04/05/2017)   CHF (congestive heart failure) (HCC)    COPD (chronic obstructive pulmonary disease) (HCC)    Coronary artery disease    Daily headache    "recently" (04/05/2017)   Depression    Dyspnea    with exertion   GERD (gastroesophageal reflux disease)    History of blood transfusion 1950s   History of radiation therapy 05/04/16 - 05/12/16   Left lung treated to 54 Gy with 3 fx of 18 Gy   History of radiation therapy    Left Lung- 08/02/22-08/17/22- Dr. Antony Blackbird   Hypertension    On home oxygen therapy    "2L; 24/7" (04/05/2017)   OSA (obstructive sleep apnea)    OSA on CPAP    Pneumonia 10/2015   "at least 3 times" (04/05/2017)   Pulmonary hypertension (HCC)     ALLERGIES:  has no known allergies.  MEDICATIONS:  Current Outpatient Medications  Medication Sig Dispense Refill   acetaminophen (TYLENOL) 500 MG tablet Take 1 tablet (500 mg total) by mouth every 6 (six) hours as needed. (Patient taking differently: Take 1,000 mg by mouth in the  morning and at bedtime.) 30 tablet 0   albuterol (PROVENTIL) (2.5 MG/3ML) 0.083% nebulizer solution USE 1 VIAL IN NEBULIZER EVERY 4 HOURS - And As Needed For Shortness of Breath (Patient taking differently: Take 2.5 mg by nebulization as needed for wheezing or shortness of breath.) 60 mL 11   albuterol (VENTOLIN HFA) 108 (90 Base) MCG/ACT inhaler Inhale 2 puffs into the lungs 2 (two) times daily as needed for wheezing or shortness of breath. 17 each 0   amLODipine (NORVASC) 5 MG tablet Take 5 mg by mouth daily.     ascorbic acid (VITAMIN C) 1000 MG tablet Take 1,000 mg  by mouth daily.     aspirin EC 81 MG tablet Take 81 mg by mouth daily. Swallow whole.     atorvastatin (LIPITOR) 20 MG tablet Take 20 mg by mouth at bedtime.     busPIRone (BUSPAR) 10 MG tablet Take 1 tablet by mouth 2 (two) times daily.     cetirizine (ZYRTEC) 10 MG tablet Take 10 mg by mouth at bedtime.     cholecalciferol (VITAMIN D3) 25 MCG (1000 UNIT) tablet Take 1,000 Units by mouth daily.     clopidogrel (PLAVIX) 75 MG tablet Take 75 mg by mouth every morning.      Cyanocobalamin (B-12 PO) Take 1 tablet by mouth daily.     ferrous sulfate 325 (65 FE) MG EC tablet Take 975 mg by mouth daily.     fluticasone (FLONASE) 50 MCG/ACT nasal spray Place 2 sprays into both nostrils daily. (Patient not taking: Reported on 03/30/2023) 18.2 mL 2   fluticasone furoate-vilanterol (BREO ELLIPTA) 100-25 MCG/ACT AEPB Inhale 1 puff into the lungs every morning. 28 each 11   JARDIANCE 25 MG TABS tablet Take 25 mg by mouth daily.     losartan (COZAAR) 50 MG tablet Take 50 mg by mouth every morning.     methocarbamol (ROBAXIN) 500 MG tablet Take 500 mg by mouth in the morning and at bedtime.     metoprolol succinate (TOPROL-XL) 25 MG 24 hr tablet Take 25 mg by mouth daily.     nitroGLYCERIN (NITRODUR - DOSED IN MG/24 HR) 0.4 mg/hr patch Place 0.4 mg onto the skin every other day. (Patient not taking: Reported on 03/30/2023)  3   nitroGLYCERIN (NITROSTAT) 0.4 MG SL tablet Place 0.4 mg under the tongue every 5 (five) minutes as needed for chest pain. (Patient not taking: Reported on 03/30/2023)     omeprazole (PRILOSEC) 20 MG capsule Take 20 mg by mouth daily.     OXYGEN Inhale 4-7 L into the lungs continuous.     polyethylene glycol powder (GLYCOLAX/MIRALAX) 17 GM/SCOOP powder Take by mouth.     Polyvinyl Alcohol-Povidone (REFRESH OP) Place 1 drop into both eyes daily as needed (dry eyes). (Patient not taking: Reported on 03/30/2023)     predniSONE (DELTASONE) 10 MG tablet TAKE 1 TABLET BY MOUTH ONCE DAILY AS NEEDED  (Patient taking differently: Take 10 mg by mouth daily. Takes 10 mg every other day,  and 5 mg every other day.  Alternate days Took taper, started feeling bad and went back to the 20mg .) 100 tablet 1   senna-docusate (SENOKOT-S) 8.6-50 MG tablet Take 2 tablets by mouth daily.     No current facility-administered medications for this visit.    SURGICAL HISTORY:  Past Surgical History:  Procedure Laterality Date   COLONOSCOPY     CORONARY ANGIOPLASTY WITH STENT PLACEMENT  04/05/2017   CORONARY STENT INTERVENTION N/A  04/05/2017   Procedure: CORONARY STENT INTERVENTION;  Surgeon: Rinaldo Cloud, MD;  Location: [facility];  Service: Cardiovascular;  Laterality: N/A;   DILATION AND CURETTAGE OF UTERUS     FUDUCIAL PLACEMENT Left 03/31/2016   Procedure: PLACEMENT OF FUDUCIAL LEFT UPPER LOBE;  Surgeon: Leslye Peer, MD;  Location: [facility] OR;  Service: Thoracic;  Laterality: Left;   LAPAROSCOPIC CHOLECYSTECTOMY     LEFT HEART CATH AND CORONARY ANGIOGRAPHY N/A 02/17/2017   Procedure: LEFT HEART CATH AND CORONARY ANGIOGRAPHY;  Surgeon: Rinaldo Cloud, MD;  Location: [facility];  Service: Cardiovascular;  Laterality: N/A;   SINUSOTOMY     TONSILLECTOMY     TUBAL LIGATION     VIDEO BRONCHOSCOPY WITH ENDOBRONCHIAL NAVIGATION N/A 03/31/2016   Procedure: VIDEO BRONCHOSCOPY WITH ENDOBRONCHIAL NAVIGATION;  Surgeon: Leslye Peer, MD;  Location: [facility] OR;  Service: Thoracic;  Laterality: N/A;    REVIEW OF SYSTEMS:  A comprehensive review of systems was negative except for: Constitutional: positive for fatigue Respiratory: positive for dyspnea on exertion Musculoskeletal: positive for back pain   PHYSICAL EXAMINATION: General appearance: alert, cooperative, fatigued, and no distress Head: Normocephalic, without obvious abnormality, atraumatic Neck: no adenopathy, no JVD, supple, symmetrical, trachea midline, and thyroid not enlarged, symmetric, no tenderness/mass/nodules Lymph nodes: Cervical,  supraclavicular, and axillary nodes normal. Resp: clear to auscultation bilaterally Back: symmetric, no curvature. ROM normal. No CVA tenderness. Cardio: regular rate and rhythm, S1, S2 normal, no murmur, click, rub or gallop GI: soft, non-tender; bowel sounds normal; no masses,  no organomegaly Extremities: extremities normal, atraumatic, no cyanosis or edema  ECOG PERFORMANCE STATUS: 1 - Symptomatic but completely ambulatory  Blood pressure (!) 180/73, pulse 95, temperature 97.6 F (36.4 C), resp. rate 18, height 5\' 2"  (1.575 m), SpO2 100%.  LABORATORY DATA: Lab Results  Component Value Date   WBC 7.5 04/15/2023   HGB 11.4 (L) 04/15/2023   HCT 37.9 04/15/2023   MCV 107.4 (H) 04/15/2023   PLT 113 (L) 04/15/2023      Chemistry      Component Value Date/Time   NA 145 04/15/2023 1004   NA 141 08/13/2016 1120   K 4.5 04/15/2023 1004   K 3.4 (L) 08/13/2016 1120   CL 102 04/15/2023 1004   CO2 36 (H) 04/15/2023 1004   CO2 25 08/13/2016 1120   BUN 26 (H) 04/15/2023 1004   BUN 15.8 08/13/2016 1120   CREATININE 1.15 (H) 04/15/2023 1004   CREATININE 1.1 08/13/2016 1120      Component Value Date/Time   CALCIUM 10.1 04/15/2023 1004   CALCIUM 9.1 08/13/2016 1120   ALKPHOS 44 04/15/2023 1004   ALKPHOS 68 08/13/2016 1120   AST 15 04/15/2023 1004   AST 20 08/13/2016 1120   ALT 17 04/15/2023 1004   ALT 14 08/13/2016 1120   BILITOT 0.6 04/15/2023 1004   BILITOT 0.47 08/13/2016 1120       RADIOGRAPHIC STUDIES: DG Chest 2 View Result Date: 03/30/2023 CLINICAL DATA:  Pneumonia. History of non-small-cell lung cancer. Status post radiation therapy. EXAM: CHEST - 2 VIEW COMPARISON:  X-ray 03/02/2023 and older.  CT 10/21/2022. FINDINGS: Hyperinflation. Normal cardiopericardial silhouette with calcified aorta. No pneumothorax or effusion. Posttreatment changes in the left midlung abutting the hilum with bandlike changes, clips. There is fullness of the AP window as well which is stable.  Enlarged pulmonary arteries. Deformity of the posterior aspect of the left 6 rib is again seen as well. Surgical clips in the upper  abdomen. Right medial mid lower lung scarring atelectatic changes as well. IMPRESSION: Stable presumed treatment changes for lung cancer in left midlung, perihilar with fullness of the hilum. No new opacity or effusion. Electronically Signed   By: Karen Kays M.D.   On: 03/30/2023 16:34     ASSESSMENT AND PLAN: This is a very pleasant 78 years old white female with a stage IA non-small cell lung cancer, adenocarcinoma presented with left upper lobe lung nodule. The patient is status post stereotactic radiotherapy to the left upper lobe lung nodule and she tolerated the procedure well. She is also status post SBRT to recurrent posterior left upper lobe lung nodule under the care of Dr. Roselind Messier completed in July 2024 The patient is currently on observation and she continues to have shortness of breath at baseline increased with exertion and currently on home oxygen. She had repeat CT scan of the chest performed recently but unfortunately the final report is still pending. Discussed the use of AI scribe software for clinical note transcription with the patient, who gave verbal consent to proceed. Assessment and Plan    Stage IA non-small cell lung cancer Stage IA non-small cell lung cancer diagnosed in March 2018, status post stereotactic body radiation therapy (SBRT) to the left lung nodule. Recurrence in the left upper lobe treated with radiation in July 2024. Currently under evaluation with a repeat CT scan of the chest. Reviewed images show no significant difference from the previous scan, awaiting official report for confirmation. - Await CT scan report. If concerning findings are present, contact her. If stable, schedule follow-up in six months.  Chronic back pain Chronic back pain due to a compound fracture that healed improperly, causing flare-ups and spasms,  particularly when lying down. The fracture may be pressing on a nerve, causing pain. She has not tried chiropractic treatment and is considering seeing an orthopedic surgeon for further evaluation. - Consider referral to an orthopedic surgeon for evaluation. - Discuss potential for chiropractic treatment with primary care provider. - Consider pain management options, including possible injections, with primary care provider.  Hypertension Blood pressure recorded at 177/76, elevated due to pain and movement. Reports that blood pressure is not usually this high and has not been checking it regularly at home. - Advise to monitor blood pressure regularly at home and report readings to primary care provider for potential medication adjustment.      The patient was advised to call immediately if she has any other concerning symptoms in the interval.  The patient voices understanding of current disease status and treatment options and is in agreement with the current care plan. All questions were answered. The patient knows to call the clinic with any problems, questions or concerns. We can certainly see the patient much sooner if necessary.  Disclaimer: This note was dictated with voice recognition software. Similar sounding words can inadvertently be transcribed and may not be corrected upon review.

## 2023-04-26 DIAGNOSIS — I10 Essential (primary) hypertension: Secondary | ICD-10-CM | POA: Diagnosis not present

## 2023-04-26 DIAGNOSIS — E1136 Type 2 diabetes mellitus with diabetic cataract: Secondary | ICD-10-CM | POA: Diagnosis not present

## 2023-04-26 DIAGNOSIS — H25812 Combined forms of age-related cataract, left eye: Secondary | ICD-10-CM | POA: Diagnosis not present

## 2023-04-26 DIAGNOSIS — H25813 Combined forms of age-related cataract, bilateral: Secondary | ICD-10-CM | POA: Diagnosis not present

## 2023-04-26 DIAGNOSIS — Z85118 Personal history of other malignant neoplasm of bronchus and lung: Secondary | ICD-10-CM | POA: Diagnosis not present

## 2023-04-26 DIAGNOSIS — I2721 Secondary pulmonary arterial hypertension: Secondary | ICD-10-CM | POA: Diagnosis not present

## 2023-04-26 DIAGNOSIS — K219 Gastro-esophageal reflux disease without esophagitis: Secondary | ICD-10-CM | POA: Diagnosis not present

## 2023-04-26 DIAGNOSIS — I25119 Atherosclerotic heart disease of native coronary artery with unspecified angina pectoris: Secondary | ICD-10-CM | POA: Diagnosis not present

## 2023-04-26 DIAGNOSIS — J449 Chronic obstructive pulmonary disease, unspecified: Secondary | ICD-10-CM | POA: Diagnosis not present

## 2023-04-27 DIAGNOSIS — Z961 Presence of intraocular lens: Secondary | ICD-10-CM | POA: Diagnosis not present

## 2023-04-27 DIAGNOSIS — Z9842 Cataract extraction status, left eye: Secondary | ICD-10-CM | POA: Diagnosis not present

## 2023-04-27 DIAGNOSIS — H25811 Combined forms of age-related cataract, right eye: Secondary | ICD-10-CM | POA: Diagnosis not present

## 2023-05-09 DIAGNOSIS — I272 Pulmonary hypertension, unspecified: Secondary | ICD-10-CM | POA: Diagnosis not present

## 2023-05-09 DIAGNOSIS — E119 Type 2 diabetes mellitus without complications: Secondary | ICD-10-CM | POA: Diagnosis not present

## 2023-05-09 DIAGNOSIS — I10 Essential (primary) hypertension: Secondary | ICD-10-CM | POA: Diagnosis not present

## 2023-05-09 DIAGNOSIS — I2510 Atherosclerotic heart disease of native coronary artery without angina pectoris: Secondary | ICD-10-CM | POA: Diagnosis not present

## 2023-05-11 DIAGNOSIS — Z9842 Cataract extraction status, left eye: Secondary | ICD-10-CM | POA: Diagnosis not present

## 2023-05-11 DIAGNOSIS — Z961 Presence of intraocular lens: Secondary | ICD-10-CM | POA: Diagnosis not present

## 2023-05-11 DIAGNOSIS — H25811 Combined forms of age-related cataract, right eye: Secondary | ICD-10-CM | POA: Diagnosis not present

## 2023-05-20 DIAGNOSIS — J42 Unspecified chronic bronchitis: Secondary | ICD-10-CM | POA: Diagnosis not present

## 2023-05-20 DIAGNOSIS — I10 Essential (primary) hypertension: Secondary | ICD-10-CM | POA: Diagnosis not present

## 2023-05-23 DIAGNOSIS — H25811 Combined forms of age-related cataract, right eye: Secondary | ICD-10-CM | POA: Diagnosis not present

## 2023-05-23 DIAGNOSIS — Z9842 Cataract extraction status, left eye: Secondary | ICD-10-CM | POA: Diagnosis not present

## 2023-05-23 DIAGNOSIS — Z961 Presence of intraocular lens: Secondary | ICD-10-CM | POA: Diagnosis not present

## 2023-05-30 DIAGNOSIS — E119 Type 2 diabetes mellitus without complications: Secondary | ICD-10-CM | POA: Diagnosis not present

## 2023-05-30 DIAGNOSIS — G4733 Obstructive sleep apnea (adult) (pediatric): Secondary | ICD-10-CM | POA: Diagnosis not present

## 2023-05-30 DIAGNOSIS — J449 Chronic obstructive pulmonary disease, unspecified: Secondary | ICD-10-CM | POA: Diagnosis not present

## 2023-05-30 DIAGNOSIS — T8522XA Displacement of intraocular lens, initial encounter: Secondary | ICD-10-CM | POA: Diagnosis not present

## 2023-05-30 DIAGNOSIS — I152 Hypertension secondary to endocrine disorders: Secondary | ICD-10-CM | POA: Diagnosis not present

## 2023-05-30 DIAGNOSIS — H43813 Vitreous degeneration, bilateral: Secondary | ICD-10-CM | POA: Diagnosis not present

## 2023-05-30 DIAGNOSIS — H25811 Combined forms of age-related cataract, right eye: Secondary | ICD-10-CM | POA: Diagnosis not present

## 2023-05-30 DIAGNOSIS — Z961 Presence of intraocular lens: Secondary | ICD-10-CM | POA: Diagnosis not present

## 2023-05-30 DIAGNOSIS — Z7984 Long term (current) use of oral hypoglycemic drugs: Secondary | ICD-10-CM | POA: Diagnosis not present

## 2023-05-30 DIAGNOSIS — E1136 Type 2 diabetes mellitus with diabetic cataract: Secondary | ICD-10-CM | POA: Diagnosis not present

## 2023-05-30 DIAGNOSIS — H2511 Age-related nuclear cataract, right eye: Secondary | ICD-10-CM | POA: Diagnosis not present

## 2023-05-30 DIAGNOSIS — Z9889 Other specified postprocedural states: Secondary | ICD-10-CM | POA: Diagnosis not present

## 2023-05-30 DIAGNOSIS — Z9842 Cataract extraction status, left eye: Secondary | ICD-10-CM | POA: Diagnosis not present

## 2023-05-30 DIAGNOSIS — Z9981 Dependence on supplemental oxygen: Secondary | ICD-10-CM | POA: Diagnosis not present

## 2023-05-30 DIAGNOSIS — I272 Pulmonary hypertension, unspecified: Secondary | ICD-10-CM | POA: Diagnosis not present

## 2023-05-30 DIAGNOSIS — Z87891 Personal history of nicotine dependence: Secondary | ICD-10-CM | POA: Diagnosis not present

## 2023-05-30 DIAGNOSIS — Z01818 Encounter for other preprocedural examination: Secondary | ICD-10-CM | POA: Diagnosis not present

## 2023-06-02 DIAGNOSIS — K219 Gastro-esophageal reflux disease without esophagitis: Secondary | ICD-10-CM | POA: Diagnosis not present

## 2023-06-02 DIAGNOSIS — I2510 Atherosclerotic heart disease of native coronary artery without angina pectoris: Secondary | ICD-10-CM | POA: Diagnosis not present

## 2023-06-02 DIAGNOSIS — T8522XA Displacement of intraocular lens, initial encounter: Secondary | ICD-10-CM | POA: Diagnosis not present

## 2023-06-02 DIAGNOSIS — J449 Chronic obstructive pulmonary disease, unspecified: Secondary | ICD-10-CM | POA: Diagnosis not present

## 2023-06-02 DIAGNOSIS — I10 Essential (primary) hypertension: Secondary | ICD-10-CM | POA: Diagnosis not present

## 2023-06-02 DIAGNOSIS — E1136 Type 2 diabetes mellitus with diabetic cataract: Secondary | ICD-10-CM | POA: Diagnosis not present

## 2023-06-02 DIAGNOSIS — Z85118 Personal history of other malignant neoplasm of bronchus and lung: Secondary | ICD-10-CM | POA: Diagnosis not present

## 2023-06-02 DIAGNOSIS — H43811 Vitreous degeneration, right eye: Secondary | ICD-10-CM | POA: Diagnosis not present

## 2023-06-02 DIAGNOSIS — H4301 Vitreous prolapse, right eye: Secondary | ICD-10-CM | POA: Diagnosis not present

## 2023-06-03 DIAGNOSIS — T8522XA Displacement of intraocular lens, initial encounter: Secondary | ICD-10-CM | POA: Diagnosis not present

## 2023-06-03 DIAGNOSIS — Z9842 Cataract extraction status, left eye: Secondary | ICD-10-CM | POA: Diagnosis not present

## 2023-06-03 DIAGNOSIS — Z961 Presence of intraocular lens: Secondary | ICD-10-CM | POA: Diagnosis not present

## 2023-06-03 DIAGNOSIS — H25811 Combined forms of age-related cataract, right eye: Secondary | ICD-10-CM | POA: Diagnosis not present

## 2023-06-10 DIAGNOSIS — Z961 Presence of intraocular lens: Secondary | ICD-10-CM | POA: Diagnosis not present

## 2023-06-10 DIAGNOSIS — Z9842 Cataract extraction status, left eye: Secondary | ICD-10-CM | POA: Diagnosis not present

## 2023-06-10 DIAGNOSIS — H25811 Combined forms of age-related cataract, right eye: Secondary | ICD-10-CM | POA: Diagnosis not present

## 2023-06-10 DIAGNOSIS — T8522XA Displacement of intraocular lens, initial encounter: Secondary | ICD-10-CM | POA: Diagnosis not present

## 2023-06-13 ENCOUNTER — Other Ambulatory Visit: Payer: Self-pay | Admitting: Internal Medicine

## 2023-06-19 DIAGNOSIS — I10 Essential (primary) hypertension: Secondary | ICD-10-CM | POA: Diagnosis not present

## 2023-06-19 DIAGNOSIS — J42 Unspecified chronic bronchitis: Secondary | ICD-10-CM | POA: Diagnosis not present

## 2023-06-27 DIAGNOSIS — Z1211 Encounter for screening for malignant neoplasm of colon: Secondary | ICD-10-CM | POA: Diagnosis not present

## 2023-06-27 DIAGNOSIS — Z9981 Dependence on supplemental oxygen: Secondary | ICD-10-CM | POA: Diagnosis not present

## 2023-06-27 DIAGNOSIS — J441 Chronic obstructive pulmonary disease with (acute) exacerbation: Secondary | ICD-10-CM | POA: Diagnosis not present

## 2023-06-27 DIAGNOSIS — E119 Type 2 diabetes mellitus without complications: Secondary | ICD-10-CM | POA: Diagnosis not present

## 2023-06-27 DIAGNOSIS — E876 Hypokalemia: Secondary | ICD-10-CM | POA: Diagnosis not present

## 2023-06-27 DIAGNOSIS — C3492 Malignant neoplasm of unspecified part of left bronchus or lung: Secondary | ICD-10-CM | POA: Diagnosis not present

## 2023-06-27 DIAGNOSIS — I2721 Secondary pulmonary arterial hypertension: Secondary | ICD-10-CM | POA: Diagnosis not present

## 2023-06-27 DIAGNOSIS — I10 Essential (primary) hypertension: Secondary | ICD-10-CM | POA: Diagnosis not present

## 2023-06-27 DIAGNOSIS — E781 Pure hyperglyceridemia: Secondary | ICD-10-CM | POA: Diagnosis not present

## 2023-07-08 DIAGNOSIS — Z9842 Cataract extraction status, left eye: Secondary | ICD-10-CM | POA: Diagnosis not present

## 2023-07-08 DIAGNOSIS — H25811 Combined forms of age-related cataract, right eye: Secondary | ICD-10-CM | POA: Diagnosis not present

## 2023-07-08 DIAGNOSIS — Z961 Presence of intraocular lens: Secondary | ICD-10-CM | POA: Diagnosis not present

## 2023-07-08 DIAGNOSIS — Z4889 Encounter for other specified surgical aftercare: Secondary | ICD-10-CM | POA: Diagnosis not present

## 2023-07-08 DIAGNOSIS — T8522XA Displacement of intraocular lens, initial encounter: Secondary | ICD-10-CM | POA: Diagnosis not present

## 2023-07-20 DIAGNOSIS — J42 Unspecified chronic bronchitis: Secondary | ICD-10-CM | POA: Diagnosis not present

## 2023-07-20 DIAGNOSIS — I10 Essential (primary) hypertension: Secondary | ICD-10-CM | POA: Diagnosis not present

## 2023-08-08 DIAGNOSIS — I10 Essential (primary) hypertension: Secondary | ICD-10-CM | POA: Diagnosis not present

## 2023-08-08 DIAGNOSIS — E782 Mixed hyperlipidemia: Secondary | ICD-10-CM | POA: Diagnosis not present

## 2023-08-08 DIAGNOSIS — E119 Type 2 diabetes mellitus without complications: Secondary | ICD-10-CM | POA: Diagnosis not present

## 2023-08-08 DIAGNOSIS — I2510 Atherosclerotic heart disease of native coronary artery without angina pectoris: Secondary | ICD-10-CM | POA: Diagnosis not present

## 2023-08-10 ENCOUNTER — Telehealth (HOSPITAL_BASED_OUTPATIENT_CLINIC_OR_DEPARTMENT_OTHER): Payer: Self-pay

## 2023-08-10 NOTE — Telephone Encounter (Signed)
 Copied from CRM [id number]. Topic: Clinical - Prescription Issue >> Aug 09, 2023  2:43 PM Suzanne Dickson wrote: Reason for CRM: Patient is calling to inqire about why she cannot pick up prednisone  until October 08 per pharmacy. Requesting new Rx be sent in, as she only has enough for three days next week.

## 2023-08-10 NOTE — Telephone Encounter (Signed)
 Patient states the bottle she has was dispensed on 5/19 for #45. Rx was written for #90. She says pharmacist told her to bring bottle so they can confirm. NFN

## 2023-08-19 DIAGNOSIS — J42 Unspecified chronic bronchitis: Secondary | ICD-10-CM | POA: Diagnosis not present

## 2023-08-19 DIAGNOSIS — I10 Essential (primary) hypertension: Secondary | ICD-10-CM | POA: Diagnosis not present

## 2023-08-21 ENCOUNTER — Other Ambulatory Visit: Payer: Self-pay | Admitting: Internal Medicine

## 2023-08-24 ENCOUNTER — Other Ambulatory Visit: Payer: Self-pay | Admitting: Family Medicine

## 2023-08-24 ENCOUNTER — Other Ambulatory Visit: Payer: Self-pay

## 2023-08-24 MED ORDER — ALBUTEROL SULFATE (2.5 MG/3ML) 0.083% IN NEBU: 2.5000 mg | INHALATION_SOLUTION | RESPIRATORY_TRACT | 11 refills | Status: DC | PRN

## 2023-08-24 NOTE — Telephone Encounter (Unsigned)
 Copied from CRM [id number]. Topic: Clinical - Medication Refill >> Aug 24, 2023  3:05 PM Nathanel DEL wrote: Medication: albuterol  (VENTOLIN  HFA) 108 (90 Base) MCG/ACT inhaler  Has the patient contacted their pharmacy? Yes Pt contacted [facility] and told to to call her dr  This is the patient's preferred pharmacy:  [facility] [address] [place], [place] - [address] [place] [address] Phone: [phone] Fax: [phone]   Is this the correct pharmacy for this prescription? Yes If no, delete pharmacy and type the correct one.   Has the prescription been filled recently? No  Is the patient out of the medication? Yes  Has the patient been seen for an appointment in the last year OR does the patient have an upcoming appointment? Yes  Can we respond through MyChart? No  Agent: Please be advised that Rx refills may take up to 3 business days. We ask that you follow-up with your pharmacy.

## 2023-08-25 MED ORDER — ALBUTEROL SULFATE HFA 108 (90 BASE) MCG/ACT IN AERS: 2.0000 | INHALATION_SPRAY | Freq: Two times a day (BID) | RESPIRATORY_TRACT | 2 refills | Status: AC | PRN

## 2023-08-31 ENCOUNTER — Telehealth: Payer: Self-pay

## 2023-08-31 ENCOUNTER — Other Ambulatory Visit: Payer: Self-pay

## 2023-08-31 MED ORDER — PREDNISONE 10 MG PO TABS: ORAL_TABLET | ORAL | 0 refills | Status: AC

## 2023-08-31 NOTE — Telephone Encounter (Signed)
 Copied from CRM [id number]. Topic: Clinical - Medication Question >> Aug 29, 2023 10:52 AM Shona S wrote: Reason for CRM: patient is calling to ask for status of her nebulizer, she needs a new prescription.    Tried to call PT no way to leave message -VM not set up # on DPR is not listed as a know # in demographics -  looks like neb sol. Was order through [facility] please reach out to them at [phone]

## 2023-08-31 NOTE — Telephone Encounter (Signed)
 Okay refill prednisone  10mg . Patient has upcoming OV with Dr. Darlean in September 2025

## 2023-09-09 DIAGNOSIS — Z961 Presence of intraocular lens: Secondary | ICD-10-CM | POA: Diagnosis not present

## 2023-09-09 DIAGNOSIS — H25811 Combined forms of age-related cataract, right eye: Secondary | ICD-10-CM | POA: Diagnosis not present

## 2023-09-09 DIAGNOSIS — Z9842 Cataract extraction status, left eye: Secondary | ICD-10-CM | POA: Diagnosis not present

## 2023-09-09 DIAGNOSIS — T8522XA Displacement of intraocular lens, initial encounter: Secondary | ICD-10-CM | POA: Diagnosis not present

## 2023-09-19 DIAGNOSIS — J42 Unspecified chronic bronchitis: Secondary | ICD-10-CM | POA: Diagnosis not present

## 2023-09-19 DIAGNOSIS — I10 Essential (primary) hypertension: Secondary | ICD-10-CM | POA: Diagnosis not present

## 2023-09-20 ENCOUNTER — Other Ambulatory Visit: Payer: Self-pay | Admitting: Internal Medicine

## 2023-09-27 DIAGNOSIS — Z713 Dietary counseling and surveillance: Secondary | ICD-10-CM | POA: Diagnosis not present

## 2023-09-27 DIAGNOSIS — E119 Type 2 diabetes mellitus without complications: Secondary | ICD-10-CM | POA: Diagnosis not present

## 2023-09-27 DIAGNOSIS — J189 Pneumonia, unspecified organism: Secondary | ICD-10-CM | POA: Diagnosis not present

## 2023-09-27 DIAGNOSIS — E663 Overweight: Secondary | ICD-10-CM | POA: Diagnosis not present

## 2023-09-27 DIAGNOSIS — J441 Chronic obstructive pulmonary disease with (acute) exacerbation: Secondary | ICD-10-CM | POA: Diagnosis not present

## 2023-09-27 DIAGNOSIS — I10 Essential (primary) hypertension: Secondary | ICD-10-CM | POA: Diagnosis not present

## 2023-09-27 DIAGNOSIS — N179 Acute kidney failure, unspecified: Secondary | ICD-10-CM | POA: Diagnosis not present

## 2023-09-27 DIAGNOSIS — Z7182 Exercise counseling: Secondary | ICD-10-CM | POA: Diagnosis not present

## 2023-09-27 DIAGNOSIS — R062 Wheezing: Secondary | ICD-10-CM | POA: Diagnosis not present

## 2023-09-27 DIAGNOSIS — Z0001 Encounter for general adult medical examination with abnormal findings: Secondary | ICD-10-CM | POA: Diagnosis not present

## 2023-09-28 NOTE — Progress Notes (Signed)
 Subjective:    Patient ID: Suzanne Dickson, female   DOB: [birth year]     MRN: [id number]    Brief patient profile:  89  yobf quit smoking on admit 09/02/14  and proved to have GOLD III copd 11/2014    History of Present Illness  09/22/2015  f/u ov/Suzanne Dickson re: copd III/ symb 1602bid on 2lpm at rest/ 4lpm ex  Chief Complaint  Patient presents with   Follow-up    [place] had hemoptysis and increased SOB. She took round pred and augmentin  and symptoms are some better. She has noticed minimal wheezing at night. She has used neb 3 x over the past 2 wks.   liked bevespi  better but wasn't covered then and not doing as well on the symbicort  though hfa not ideal  - see a/p rec Plan A = Automatic =stop symbicort  and restart Bevespi  Take 2 puffs first thing in am and then another 2 puffs about 12 hours later.  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   11/26/2021  f/u ov/Suzanne Dickson re: GOLD 2 copd/02 dep   maint on 10 mg /daily worse on 5mg   Chief Complaint  Patient presents with   Follow-up  Dyspnea:  across the room / sometimes sits outside garage Cough: none  Sleeping: 45 degrees wedge pillow  SABA use: uses hfa but neb one or two a day  02: 4 sleeping and up to  6lpm walking with sats mostly 90s  Rec Prednisone  10 mg alternating with 5 mg  Plan A = Automatic = Always=    Trelegy 100 each am  Plan B = Backup (to supplement plan A, not to replace it) Only use your albuterol  inhaler as a rescue  medication Plan C = Crisis (instead of Plan B but only if Plan B stops working) - only use your albuterol  nebulizer if you first try Plan B       09/28/2022  3 m f/u ov/Suzanne Dickson re: GOLD 2/ 02 dep/ OCS dep   maint on trelegy  10-29-08 prednisone   Chief Complaint  Patient presents with   Follow-up    SOB today.  Patient states she dose not feel well today.  Dyspnea:  room to room with a walker on 6lpm limited by back pain, not breathing  Cough: none  Sleeping: bed level with wedge s  resp cc s cpap  SABA use: every couple of days 02: 4lpm hs  up to 6lpm walkng  Severe constipation ? Trelegy related  Rec Plan A = Automatic = Always=    Breo 100 one click each am - alternative advair 250 one twice daily  Plan B = Backup (to supplement plan A, not to replace it) Only use your albuterol  inhaler as a rescue medication  Plan C = Crisis (instead of Plan B but only if Plan B stops working) - only use your albuterol   nebulizer if you first try Plan B  Make sure you check your oxygen  saturation  AT  your highest level of activity (not after you stop)   to be sure it stays over 90%      09/29/2023  yearly f/u ov/Suzanne Dickson re: COPD GOLD 2 / 02 dep/ OCS dep  maint on pred 10-5- 10  Breo  Chief Complaint  Patient presents with   Shortness of Breath    Wheezing and PND and nasal congestion x 2 weeks   Dyspnea:  room to room with a walker  Cough: none  Sleeping: level bed/ wedge pillow resp cc  SABA use: rarely need neb  02: increased to 6 lp to get to dressed then back to 4lpm   Already on amox / pred 20 mg for nasal flare   since 09/27/23     No obvious day to day or daytime variability or assoc excess/ purulent sputum or mucus plugs or hemoptysis or cp or chest tightness,  overt  hb symptoms.    Also denies any obvious fluctuation of symptoms with weather or environmental changes or other aggravating or alleviating factors except as outlined above   No unusual exposure hx or h/o childhood pna/ asthma or  knowledge of premature birth.  Current Allergies, Complete Past Medical History, Past Surgical History, Family History, and Social History were reviewed in [employer] record.  ROS  The following are not active complaints unless bolded Hoarseness, sore throat, dysphagia, dental problems, itching, sneezing,  nasal congestion or discharge of excess mucus or purulent secretions, ear ache,   fever, chills, sweats, unintended wt loss or wt gain, classically pleuritic or exertional cp,  orthopnea pnd or arm/hand swelling  or leg swelling, presyncope, palpitations, abdominal pain, anorexia, nausea, vomiting, diarrhea  or change in bowel habits or change in bladder habits, change in stools or change in urine, dysuria, hematuria,  rash, arthralgias, visual complaints, headache, numbness, weakness or ataxia or problems with walking or coordination,  change in mood or  memory.        Current Meds  Medication Sig   acetaminophen  (TYLENOL ) 500 MG tablet Take 1 tablet (500 mg total) by mouth every 6 (six) hours as needed. (Patient taking differently: Take 1,000 mg by mouth in the morning and at bedtime.)   albuterol  (VENTOLIN  HFA) 108 (90 Base) MCG/ACT inhaler Inhale 2 puffs into the lungs 2 (two) times daily as needed for wheezing or shortness of breath.   amLODipine  (NORVASC ) 5 MG tablet Take 5 mg by mouth daily.   amoxicillin -clavulanate (AUGMENTIN ) 875-125 MG tablet Take 1 tablet by mouth 2 (two) times daily.   ascorbic acid (VITAMIN C) 1000 MG tablet Take 1,000 mg by mouth daily.   aspirin  EC 81 MG tablet Take 81 mg by mouth daily. Swallow whole.   atorvastatin  (LIPITOR) 20 MG tablet Take 20 mg by mouth at bedtime.   BREO ELLIPTA  100-25 MCG/ACT AEPB INHALE 1 PUFF BY MOUTH IN THE MORNING   cetirizine (ZYRTEC) 10 MG tablet Take 10 mg by mouth at bedtime.   cholecalciferol  (VITAMIN D3) 25 MCG (1000 UNIT) tablet Take 1,000 Units by mouth daily.   clopidogrel  (PLAVIX ) 75 MG tablet Take  75 mg by mouth every morning.    Cyanocobalamin (B-12 PO) Take 1 tablet by mouth daily.   ferrous sulfate  325 (65 FE) MG EC tablet Take 975 mg by mouth daily.   fluticasone  (FLONASE ) 50 MCG/ACT nasal spray Place 2 sprays into both nostrils  daily.   JARDIANCE 25 MG TABS tablet Take 25 mg by mouth daily.   losartan  (COZAAR ) 50 MG tablet Take 50 mg by mouth every morning.   methocarbamol  (ROBAXIN ) 500 MG tablet Take 500 mg by mouth in the morning and at bedtime.   metoprolol  succinate (TOPROL -XL) 25 MG 24 hr tablet Take 25 mg by mouth daily.   nitroGLYCERIN  (NITRODUR - DOSED IN MG/24 HR) 0.4 mg/hr patch Place 0.4 mg onto the skin every other day.   nitroGLYCERIN  (NITROSTAT ) 0.4 MG SL tablet Place 0.4 mg under the tongue every 5 (five) minutes as needed for chest pain.   omeprazole (PRILOSEC) 20 MG capsule Take 20 mg by mouth daily.   OXYGEN  Inhale 4-7 L into the lungs continuous.   polyethylene glycol powder (GLYCOLAX /MIRALAX ) 17 GM/SCOOP powder Take by mouth.   Polyvinyl Alcohol-Povidone (REFRESH OP) Place 1 drop into both eyes daily as needed (dry eyes).   predniSONE  (DELTASONE ) 10 MG tablet TAKE 10MG  EVERY OTHER DAY, ALTERNATING WITH 5MG  (Patient taking differently: Take 20 mg by mouth daily with breakfast. TAKE 10MG  EVERY OTHER DAY, ALTERNATING WITH 5MG )   senna-docusate (SENOKOT-S) 8.6-50 MG tablet Take 2 tablets by mouth daily.   [DISCONTINUED] albuterol  (PROVENTIL ) (2.5 MG/3ML) 0.083% nebulizer solution Take 3 mLs (2.5 mg total) by nebulization as needed for wheezing or shortness of breath.            Objective:  Physical Exam  Wts  09/29/2023      158  09/28/2022      154  06/09/2022   160  05/25/2021     156 02/16/2021   158 06/02/2020     170  12/03/2019   162 03/29/2019     164  08/08/2018   154 10/28/2014        160 > 12/09/2014 161 > 03/14/2015 158 >    04/25/2015  161 >  06/20/2015  158 > 09/22/2015   159 > 11/19/2015 159 > 12/23/2015   159  > 02/03/2016    163  > 05/18/2016   161 >  11/17/2016   154 > 01/07/2017  157 > 04/18/2017   148 > 07/19/2017  145 > 10/19/2017    150  > 11/16/2017   150 >  01/11/2018   150> 02/17/2018  150     10/10/14 160 lb (72.576 kg)  09/16/14 156 lb (70.761 kg)  09/04/14 160 lb 13.6 oz (72.96 kg)    Vital signs reviewed  09/29/2023  - Note at rest 02 sats  95% on 5lpm cont   General appearance:    cushingnoid w/c bound pleasant but frail eldery bf nad    HEENT : Oropharynx  clear   Nasal turbinates nl    NECK :  without  apparent JVD/ palpable Nodes/TM    LUNGS: no acc muscle use,  Mild barrel contour chest wall with bilateral  Distant bs s audible wheeze and  without cough on insp or exp maneuvers  and mild  Hyperresonant  to  percussion bilaterally     CV:  RRR  no s3 or murmur or increase in P2, and no edema   ABD:  soft and nontender    MS:   ext warm without deformities Or obvious joint restrictions  calf tenderness, cyanosis or clubbing     SKIN: warm and dry without lesions    NEURO:  alert, approp, nl sensorium with  no motor or cerebellar deficits apparent.  Assessment:        Assessment & Plan COPD GOLD III/ 02 dep  Quit smoking 08/2014 - PFT's 12/09/14   FEV1 0.75 (44 % ) ratio 62  p no % improvement from saba p ? prior to study with DLCO  24 % corrects to 48 % for alv volume    - 12/09/2014 referred to rehab > started 12/2014  - 04/25/2015   try BEVESPI   - 06/20/2015  Changed by insurance> back on symb 160  - 09/22/2015 not happy with symbicort  > changed back to besvespi 2bid > not happy with bevespi  > changed back to symb 160 2bid and consider adding spiriva next vs trelegy trial   - 02/17/2018  After extensive coaching inhaler device,  effectiveness =    50% with hfa and 90% with elipta so rec changed to trelegy   - 05/08/2018 added pred x 6 days as plan D  - 01/15/2019 changed pred to 20 mg until better then 10 mg daily  - 03/29/2019  After extensive coaching inhaler device,  effectiveness =    50% with hfa  -  12/03/2019 changed to ceiling of 20 mg pred/ floor of 5 mg per day - 02/16/2021 chagned pred 20 mg ceiling and floor of 10 mg as flared on 5 consistently  -  11/26/21 rechallenged with ceiling of 20 mg and floor of 5 mg daily/ retrained on Elipta  -  09/28/2022 rec change trelegy to [facility] due to constipation > resolved       Group D (now reclassified as E) in terms of symptom/risk and laba/lama/ICS  therefore appropriate rx at this point >>>  Breo plus [place] if she could tolerate  eg stiolto 2.5 x one puff daily and hold if constipation again or if fails this then ohtuvayre  trial   In meantime rec continue to work to min prednisone  with ceiling of 20 mg / day and floor of 5 mg   Discussed in detail all the  indications, usual  risks and alternatives  relative to the benefits with patient who agrees to proceed with Rx as outlined.         Chronic respiratory failure with hypoxia (HCC) Started on 02 2lpm at d/c 09/04/14  - 10/28/2014   Walked RA x one lap @ 185 stopped due to  desat to 82%  at nl pace so rec 02 2lpm with walking and sleeping  - 12/09/2014 referred for POC > did not qualify - 08/17/2016 Patient Saturations on Room Air at Rest = 85%---increased to 93% on 2lpm o2 - 04/18/2017  Walking across exam room sats dropped into 80's on 2lpm  - 11/16/2017   Walked RA x one lap @ 185 stopped due to 87% and even on 5lpm could not maintain sats at nl pace  -  01/11/2018 humidfy 02  Added > not using as of 05/08/2018 > rec she do so > improved on humidity - 03/29/2019  Required up to 6lpm with activity to complete about 200 ft walking   HC03  9//2/25  = 22 so not likely hypercaric at this point   >>> reviewed goal of sats > 90% at all times          Each maintenance medication was reviewed in detail including emphasizing most importantly the difference between maintenance and prns and under what circumstances the prns are to be triggered using an action plan format where appropriate.  Total time for  H and P, chart review, counseling,  reviewing dpi/hfa/neb/ 02 /pulse ox  device(s) and generating customized AVS unique to this office visit / same day charting =         AVS  Patient Instructions  No change in recommendations   Please schedule a follow up visit in 3 months but call sooner if needed to see NP   Add: consider stiolto 2.5 x one puff daily (minimize constipation risk) or ohtuvayre  next ov    Ozell America, MD 10/01/2023

## 2023-09-29 ENCOUNTER — Ambulatory Visit: Admitting: Internal Medicine

## 2023-09-29 ENCOUNTER — Encounter: Payer: Self-pay | Admitting: Internal Medicine

## 2023-09-29 VITALS — BP 131/76 | HR 91 | Temp 98.0°F | Ht 62.0 in | Wt 158.0 lb

## 2023-09-29 DIAGNOSIS — J449 Chronic obstructive pulmonary disease, unspecified: Secondary | ICD-10-CM | POA: Diagnosis not present

## 2023-09-29 DIAGNOSIS — J9611 Chronic respiratory failure with hypoxia: Secondary | ICD-10-CM

## 2023-09-29 MED ORDER — ALBUTEROL SULFATE (2.5 MG/3ML) 0.083% IN NEBU: 2.5000 mg | INHALATION_SOLUTION | RESPIRATORY_TRACT | 11 refills | Status: AC | PRN

## 2023-09-29 NOTE — Patient Instructions (Addendum)
 No change in recommendations   Please schedule a follow up visit in 3 months but call sooner if needed to see NP   Add: consider stiolto 2.5 x one puff daily (minimize constipation risk) or ohtuvayre  next ov

## 2023-10-01 NOTE — Assessment & Plan Note (Addendum)
 Quit smoking 08/2014 - PFT's 12/09/14   FEV1 0.75 (44 % ) ratio 62  p no % improvement from saba p ? prior to study with DLCO  24 % corrects to 48 % for alv volume    - 12/09/2014 referred to rehab > started 12/2014  - 04/25/2015   try BEVESPI   - 06/20/2015  Changed by insurance> back on symb 160  - 09/22/2015 not happy with symbicort  > changed back to besvespi 2bid > not happy with bevespi  > changed back to symb 160 2bid and consider adding spiriva next vs trelegy trial   - 02/17/2018  After extensive coaching inhaler device,  effectiveness =    50% with hfa and 90% with elipta so rec changed to trelegy   - 05/08/2018 added pred x 6 days as plan D  - 01/15/2019 changed pred to 20 mg until better then 10 mg daily  - 03/29/2019  After extensive coaching inhaler device,  effectiveness =    50% with hfa  - 12/03/2019 changed to ceiling of 20 mg pred/ floor of 5 mg per day - 02/16/2021 chagned pred 20 mg ceiling and floor of 10 mg as flared on 5 consistently  -  11/26/21 rechallenged with ceiling of 20 mg and floor of 5 mg daily/ retrained on Elipta  -  09/28/2022 rec change trelegy to [facility] due to constipation > resolved       Group D (now reclassified as E) in terms of symptom/risk and laba/lama/ICS  therefore appropriate rx at this point >>>  Breo plus [place] if she could tolerate  eg stiolto 2.5 x one puff daily and hold if constipation again or if fails this then ohtuvayre  trial   In meantime rec continue to work to min prednisone  with ceiling of 20 mg / day and floor of 5 mg   Discussed in detail all the  indications, usual  risks and alternatives  relative to the benefits with patient who agrees to proceed with Rx as outlined.

## 2023-10-01 NOTE — Assessment & Plan Note (Addendum)
 Started on 02 2lpm at d/c 09/04/14  - 10/28/2014   Walked RA x one lap @ 185 stopped due to  desat to 82%  at nl pace so rec 02 2lpm with walking and sleeping  - 12/09/2014 referred for POC > did not qualify - 08/17/2016 Patient Saturations on Room Air at Rest = 85%---increased to 93% on 2lpm o2 - 04/18/2017  Walking across exam room sats dropped into 80's on 2lpm  - 11/16/2017   Walked RA x one lap @ 185 stopped due to 87% and even on 5lpm could not maintain sats at nl pace  -  01/11/2018 humidfy 02  Added > not using as of 05/08/2018 > rec she do so > improved on humidity - 03/29/2019  Required up to 6lpm with activity to complete about 200 ft walking   HC03  9//2/25  = 22 so not likely hypercaric at this point   >>> reviewed goal of sats > 90% at all times          Each maintenance medication was reviewed in detail including emphasizing most importantly the difference between maintenance and prns and under what circumstances the prns are to be triggered using an action plan format where appropriate.  Total time for H and P, chart review, counseling, reviewing dpi/hfa/neb/ 02 /pulse ox  device(s) and generating customized AVS unique to this office visit / same day charting = 

## 2023-10-10 ENCOUNTER — Inpatient Hospital Stay: Attending: Internal Medicine

## 2023-10-10 ENCOUNTER — Encounter (HOSPITAL_COMMUNITY): Payer: Self-pay

## 2023-10-10 ENCOUNTER — Ambulatory Visit (HOSPITAL_COMMUNITY)
Admission: RE | Admit: 2023-10-10 | Discharge: 2023-10-10 | Disposition: A | Discharge status: Home / Self Care | Source: Ambulatory Visit | Attending: Internal Medicine | Admitting: Internal Medicine

## 2023-10-10 DIAGNOSIS — C3490 Malignant neoplasm of unspecified part of unspecified bronchus or lung: Secondary | ICD-10-CM | POA: Diagnosis not present

## 2023-10-10 DIAGNOSIS — C3412 Malignant neoplasm of upper lobe, left bronchus or lung: Secondary | ICD-10-CM | POA: Insufficient documentation

## 2023-10-10 DIAGNOSIS — Z923 Personal history of irradiation: Secondary | ICD-10-CM | POA: Insufficient documentation

## 2023-10-10 DIAGNOSIS — J432 Centrilobular emphysema: Secondary | ICD-10-CM | POA: Diagnosis not present

## 2023-10-10 DIAGNOSIS — I700 Atherosclerosis of aorta: Secondary | ICD-10-CM | POA: Diagnosis not present

## 2023-10-10 LAB — CBC WITH DIFFERENTIAL (CANCER CENTER ONLY)
Abs Immature Granulocytes: 0.04 K/uL (ref 0.00–0.07)
Basophils Absolute: 0 K/uL (ref 0.0–0.1)
Basophils Relative: 0 %
Eosinophils Absolute: 0.1 K/uL (ref 0.0–0.5)
Eosinophils Relative: 2 %
HCT: 35.9 % — ABNORMAL LOW (ref 36.0–46.0)
Hemoglobin: 10.8 g/dL — ABNORMAL LOW (ref 12.0–15.0)
Immature Granulocytes: 1 %
Lymphocytes Relative: 20 %
Lymphs Abs: 1.3 K/uL (ref 0.7–4.0)
MCH: 31.7 pg (ref 26.0–34.0)
MCHC: 30.1 g/dL (ref 30.0–36.0)
MCV: 105.3 fL — ABNORMAL HIGH (ref 80.0–100.0)
Monocytes Absolute: 0.6 K/uL (ref 0.1–1.0)
Monocytes Relative: 9 %
Neutro Abs: 4.6 K/uL (ref 1.7–7.7)
Neutrophils Relative %: 68 %
Platelet Count: 130 K/uL — ABNORMAL LOW (ref 150–400)
RBC: 3.41 MIL/uL — ABNORMAL LOW (ref 3.87–5.11)
RDW: 11.6 % (ref 11.5–15.5)
WBC Count: 6.7 K/uL (ref 4.0–10.5)
nRBC: 0 % (ref 0.0–0.2)

## 2023-10-10 LAB — CMP (CANCER CENTER ONLY)
ALT: 13 U/L (ref 0–44)
AST: 14 U/L — ABNORMAL LOW (ref 15–41)
Albumin: 4.5 g/dL (ref 3.5–5.0)
Alkaline Phosphatase: 43 U/L (ref 38–126)
Anion gap: 5 (ref 5–15)
BUN: 22 mg/dL (ref 8–23)
CO2: 32 mmol/L (ref 22–32)
Calcium: 9.6 mg/dL (ref 8.9–10.3)
Chloride: 107 mmol/L (ref 98–111)
Creatinine: 1.14 mg/dL — ABNORMAL HIGH (ref 0.44–1.00)
GFR, Estimated: 50 mL/min — ABNORMAL LOW (ref >60)
Glucose, Bld: 134 mg/dL — ABNORMAL HIGH (ref 70–99)
Potassium: 3.7 mmol/L (ref 3.5–5.1)
Sodium: 144 mmol/L (ref 135–145)
Total Bilirubin: 0.5 mg/dL (ref 0.0–1.2)
Total Protein: 7.2 g/dL (ref 6.5–8.1)

## 2023-10-12 DIAGNOSIS — N179 Acute kidney failure, unspecified: Secondary | ICD-10-CM | POA: Diagnosis not present

## 2023-10-12 DIAGNOSIS — J189 Pneumonia, unspecified organism: Secondary | ICD-10-CM | POA: Diagnosis not present

## 2023-10-12 DIAGNOSIS — K219 Gastro-esophageal reflux disease without esophagitis: Secondary | ICD-10-CM | POA: Diagnosis not present

## 2023-10-12 DIAGNOSIS — E119 Type 2 diabetes mellitus without complications: Secondary | ICD-10-CM | POA: Diagnosis not present

## 2023-10-12 DIAGNOSIS — R1013 Epigastric pain: Secondary | ICD-10-CM | POA: Diagnosis not present

## 2023-10-12 DIAGNOSIS — E0590 Thyrotoxicosis, unspecified without thyrotoxic crisis or storm: Secondary | ICD-10-CM | POA: Diagnosis not present

## 2023-10-12 DIAGNOSIS — R140 Abdominal distension (gaseous): Secondary | ICD-10-CM | POA: Diagnosis not present

## 2023-10-12 DIAGNOSIS — F419 Anxiety disorder, unspecified: Secondary | ICD-10-CM | POA: Diagnosis not present

## 2023-10-12 DIAGNOSIS — I10 Essential (primary) hypertension: Secondary | ICD-10-CM | POA: Diagnosis not present

## 2023-10-19 ENCOUNTER — Inpatient Hospital Stay: Admitting: Internal Medicine

## 2023-10-19 ENCOUNTER — Telehealth: Payer: Self-pay | Admitting: Internal Medicine

## 2023-10-19 VITALS — BP 124/72 | HR 75 | Temp 97.6°F | Resp 17 | Ht 62.0 in

## 2023-10-19 DIAGNOSIS — Z923 Personal history of irradiation: Secondary | ICD-10-CM | POA: Diagnosis not present

## 2023-10-19 DIAGNOSIS — C3412 Malignant neoplasm of upper lobe, left bronchus or lung: Secondary | ICD-10-CM | POA: Diagnosis not present

## 2023-10-19 DIAGNOSIS — C3490 Malignant neoplasm of unspecified part of unspecified bronchus or lung: Secondary | ICD-10-CM | POA: Diagnosis not present

## 2023-10-19 NOTE — Progress Notes (Signed)
 [facility] [phone]   [phone]  OFFICE PROGRESS NOTE  Earvin Johnston PARAS, FNP [address] [place] [address]  DIAGNOSIS: Stage IA (T1a, N0, M0) non-small cell lung cancer, adenocarcinoma presented with left upper lobe lung nodule diagnosed in March 2018.  PRIOR THERAPY:  1) S/P RT to the left lung nodule under the care of Dr. Shannon completed on 05/04/2016. 2) status post curative radiotherapy to the posterior left upper lobe lung nodule under the care of Dr. Shannon completed on August 17, 2022  CURRENT THERAPY: Observation.  INTERVAL HISTORY: Suzanne Dickson 78 y.o. female returns to the clinic today for follow-up visit accompanied by her husband. Discussed the use of AI scribe software for clinical note transcription with the patient, who gave verbal consent to proceed.  History of Present Illness   Suzanne Dickson is a 78 year old female with recurrent non-small cell lung cancer who presents for evaluation and repeat CT scan for restaging of her disease. She is accompanied by her husband.  She has a history of recurrent non-small cell lung cancer, initially diagnosed as stage 1A in March 2018. She underwent curative radiotherapy to a left lung nodule in April 2018 and again to a posterior left upper lobe lung nodule in July 2024. She has been on observation since then. She is concerned about the narrowing in her left lung and the presence of nodules.  She is currently on oxygen  therapy due to COPD. No hemoptysis or chest pain. She denied discomfort in the back of her shoulder on the right side.  She reports a hiatal hernia and is concerned about her acid reflux, which is related to the hernia and is being managed by her family doctor. She mentions not feeling well but is unsure how to describe her symptoms.      MEDICAL HISTORY: Past Medical History:  Diagnosis Date   Adenocarcinoma of left lung, 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   I'm in  remission (04/05/2017)   Adenocarcinoma, lung (HCC)    Anxiety    Arthritis    legs, elbows (04/05/2017)   Bronchitis, chronic (HCC)    haven't had it in awhile (04/05/2017)   CHF (congestive heart failure) (HCC)    COPD (chronic obstructive pulmonary disease) (HCC)    Coronary artery disease    Daily headache    recently (04/05/2017)   Depression    Dyspnea    with exertion   GERD (gastroesophageal reflux disease)    History of blood transfusion 1950s   History of radiation therapy 05/04/16 - 05/12/16   Left lung treated to 54 Gy with 3 fx of 18 Gy   History of radiation therapy    Left Lung- 08/02/22-08/17/22- Dr. Lynwood Shannon   Hypertension    On home oxygen  therapy    2L; 24/7 (04/05/2017)   OSA (obstructive sleep apnea)    OSA on CPAP    Pneumonia 10/2015   at least 3 times (04/05/2017)   Pulmonary hypertension (HCC)     ALLERGIES:  has no known allergies.  MEDICATIONS:  Current Outpatient Medications  Medication Sig Dispense Refill   acetaminophen  (TYLENOL ) 500 MG tablet Take 1 tablet (500 mg total) by mouth every 6 (six) hours as needed. (Patient taking differently: Take 1,000 mg by mouth in the morning and at bedtime.) 30 tablet 0   albuterol  (PROVENTIL ) (2.5 MG/3ML) 0.083% nebulizer solution Take 3 mLs (2.5 mg total) by nebulization every 4 (  four) hours as needed for wheezing or shortness of breath. 60 mL 11   albuterol  (VENTOLIN  HFA) 108 (90 Base) MCG/ACT inhaler Inhale 2 puffs into the lungs 2 (two) times daily as needed for wheezing or shortness of breath. 17 each 2   amLODipine  (NORVASC ) 5 MG tablet Take 5 mg by mouth daily.     ascorbic acid (VITAMIN C) 1000 MG tablet Take 1,000 mg by mouth daily.     aspirin  EC 81 MG tablet Take 81 mg by mouth daily. Swallow whole.     atorvastatin  (LIPITOR) 20 MG tablet Take 20 mg by mouth at bedtime.     BREO ELLIPTA  100-25 MCG/ACT AEPB INHALE 1 PUFF BY MOUTH IN THE MORNING 60 each 3   cetirizine (ZYRTEC) 10 MG tablet  Take 10 mg by mouth at bedtime.     cholecalciferol  (VITAMIN D3) 25 MCG (1000 UNIT) tablet Take 1,000 Units by mouth daily.     clopidogrel  (PLAVIX ) 75 MG tablet Take 75 mg by mouth every morning.      Cyanocobalamin (B-12 PO) Take 1 tablet by mouth daily.     ferrous sulfate  325 (65 FE) MG EC tablet Take 975 mg by mouth daily.     fluticasone  (FLONASE ) 50 MCG/ACT nasal spray Place 2 sprays into both nostrils daily. 18.2 mL 2   JARDIANCE 25 MG TABS tablet Take 25 mg by mouth daily.     losartan  (COZAAR ) 50 MG tablet Take 50 mg by mouth every morning.     methocarbamol  (ROBAXIN ) 500 MG tablet Take 500 mg by mouth in the morning and at bedtime.     metoprolol  succinate (TOPROL -XL) 25 MG 24 hr tablet Take 25 mg by mouth daily.     nitroGLYCERIN  (NITRODUR - DOSED IN MG/24 HR) 0.4 mg/hr patch Place 0.4 mg onto the skin every other day.  3   nitroGLYCERIN  (NITROSTAT ) 0.4 MG SL tablet Place 0.4 mg under the tongue every 5 (five) minutes as needed for chest pain.     omeprazole (PRILOSEC) 20 MG capsule Take 20 mg by mouth daily.     OXYGEN  Inhale 4-7 L into the lungs continuous.     polyethylene glycol powder (GLYCOLAX /MIRALAX ) 17 GM/SCOOP powder Take by mouth.     Polyvinyl Alcohol-Povidone (REFRESH OP) Place 1 drop into both eyes daily as needed (dry eyes).     predniSONE  (DELTASONE ) 10 MG tablet TAKE 10MG  EVERY OTHER DAY, ALTERNATING WITH 5MG  (Patient taking differently: Take 20 mg by mouth daily with breakfast. TAKE 10MG  EVERY OTHER DAY, ALTERNATING WITH 5MG ) 90 tablet 0   senna-docusate (SENOKOT-S) 8.6-50 MG tablet Take 2 tablets by mouth daily.     No current facility-administered medications for this visit.    SURGICAL HISTORY:  Past Surgical History:  Procedure Laterality Date   COLONOSCOPY     CORONARY ANGIOPLASTY WITH STENT PLACEMENT  04/05/2017   CORONARY STENT INTERVENTION N/A 04/05/2017   Procedure: CORONARY STENT INTERVENTION;  Surgeon: Levern Hutching, MD;  Location: [facility] IN[facility]AS[facility] CV  LAB;  Service: Cardiovascular;  Laterality: N/A;   DILATION AND CURETTAGE OF UTERUS     FUDUCIAL PLACEMENT Left 03/31/2016   Procedure: PLACEMENT OF FUDUCIAL LEFT UPPER LOBE;  Surgeon: Lamar GORMAN Chris, MD;  Location: [facility] OR;  Service: Thoracic;  Laterality: Left;   LAPAROSCOPIC CHOLECYSTECTOMY     LEFT HEART CATH AND CORONARY ANGIOGRAPHY N/A 02/17/2017   Procedure: LEFT HEART CATH AND CORONARY ANGIOGRAPHY;  Surgeon: Levern Hutching, MD;  Location: [facility] INVAS[facility] CV LAB;  Service: Cardiovascular;  Laterality: N/A;   SINUSOTOMY     TONSILLECTOMY     TUBAL LIGATION     VIDEO BRONCHOSCOPY WITH ENDOBRONCHIAL NAVIGATION N/A 03/31/2016   Procedure: VIDEO BRONCHOSCOPY WITH ENDOBRONCHIAL NAVIGATION;  Surgeon: Lamar GORMAN Chris, MD;  Location: [facility] OR;  Service: Thoracic;  Laterality: N/A;    REVIEW OF SYSTEMS:  Constitutional: positive for fatigue Eyes: negative Ears, nose, mouth, throat, and face: negative Respiratory: positive for dyspnea on exertion Cardiovascular: negative Gastrointestinal: positive for reflux symptoms Genitourinary:negative Integument/breast: negative Hematologic/lymphatic: negative Musculoskeletal:negative Neurological: negative Behavioral/Psych: negative Endocrine: negative Allergic/Immunologic: negative   PHYSICAL EXAMINATION: General appearance: alert, cooperative, fatigued, and no distress Head: Normocephalic, without obvious abnormality, atraumatic Neck: no adenopathy, no JVD, supple, symmetrical, trachea midline, and thyroid not enlarged, symmetric, no tenderness/mass/nodules Lymph nodes: Cervical, supraclavicular, and axillary nodes normal. Resp: clear to auscultation bilaterally Back: symmetric, no curvature. ROM normal. No CVA tenderness. Cardio: regular rate and rhythm, S1, S2 normal, no murmur, click, rub or gallop GI: soft, non-tender; bowel sounds normal; no masses,  no organomegaly Extremities: extremities normal, atraumatic, no cyanosis or edema Neurologic: Alert  and oriented X 3, normal strength and tone. Normal symmetric reflexes. Normal coordination and gait  ECOG PERFORMANCE STATUS: 1 - Symptomatic but completely ambulatory  Blood pressure 124/72, pulse 75, temperature 97.6 F (36.4 C), resp. rate 17, height 5' 2 (1.575 m), SpO2 98%.  LABORATORY DATA: Lab Results  Component Value Date   WBC 6.7 10/10/2023   HGB 10.8 (L) 10/10/2023   HCT 35.9 (L) 10/10/2023   MCV 105.3 (H) 10/10/2023   PLT 130 (L) 10/10/2023      Chemistry      Component Value Date/Time   NA 144 10/10/2023 0850   NA 141 08/13/2016 1120   K 3.7 10/10/2023 0850   K 3.4 (L) 08/13/2016 1120   CL 107 10/10/2023 0850   CO2 32 10/10/2023 0850   CO2 25 08/13/2016 1120   BUN 22 10/10/2023 0850   BUN 15.8 08/13/2016 1120   CREATININE 1.14 (H) 10/10/2023 0850   CREATININE 1.1 08/13/2016 1120      Component Value Date/Time   CALCIUM  9.6 10/10/2023 0850   CALCIUM  9.1 08/13/2016 1120   ALKPHOS 43 10/10/2023 0850   ALKPHOS 68 08/13/2016 1120   AST 14 (L) 10/10/2023 0850   AST 20 08/13/2016 1120   ALT 13 10/10/2023 0850   ALT 14 08/13/2016 1120   BILITOT 0.5 10/10/2023 0850   BILITOT 0.47 08/13/2016 1120       RADIOGRAPHIC STUDIES: CT Chest Wo Contrast Result Date: 10/10/2023 CLINICAL DATA:  Non-small cell lung cancer (NSCLC), staging. * Tracking Code: BO * EXAM: CT CHEST WITHOUT CONTRAST TECHNIQUE: Multidetector CT imaging of the chest was performed following the standard protocol without IV contrast. RADIATION DOSE REDUCTION: This exam was performed according to the departmental dose-optimization program which includes automated exposure control, adjustment of the mA and/or kV according to patient size and/or use of iterative reconstruction technique. COMPARISON:  CT scan chest from 04/15/2023. FINDINGS: Cardiovascular: Normal cardiac size. No pericardial effusion. No aortic aneurysm. There are coronary artery calcifications, in keeping with coronary artery disease.  There are also moderate to severe peripheral atherosclerotic vascular calcifications of thoracic aorta and its major branches. Mediastinum/Nodes: Visualized thyroid gland appears grossly unremarkable. No solid / cystic mediastinal masses. The esophagus is nondistended precluding optimal assessment. There are few mildly prominent mediastinal lymph nodes, which do not meet the size criteria for lymphadenopathy and appear grossly similar  to the prior study, favoring benign etiology. No axillary lymphadenopathy by size criteria. Evaluation of bilateral hila is limited due to lack on intravenous contrast: however, no large hilar lymphadenopathy identified. Lungs/Pleura: The central tracheo-bronchial tree is patent. There is progressive narrowing of the left lung lingular segment bronchus (series 5, image 75), with resultant increasing subsegmental atelectatic changes in the left lung lingular segment. Underlying fiducial markers again noted. There is moderate upper lobe predominant centrilobular emphysema. There are patchy areas of linear, plate-like atelectasis and/or scarring throughout bilateral lungs. There are several discrete lung nodules, described as follows: *There is a solid nodule in the left lung upper lobe, abutting the major fissure. The nodule currently measures 7 x 11 mm (series 5, image 54), which previously measured up to 6 x 8 mm. Please note the measurement on today's exam is moderately degraded by motion however, the lesion appears bigger on today's exam. *There is an irregular 4 x 5 mm nodule in the posterior segment of left upper lobe (series 5, image 40 which also appears increased since the prior study when it measured up to 2 x 3 mm. *There are several additional irregular sub 4 mm nodules in the left lung upper lobe (series 5, images 35, 42, 49, etc.), which are also new since the prior study. These are indeterminate in etiology. *There is 3-4 mm solid noncalcified nodule in the left lung lower  lobe (series 5, image 108), which was not discretely seen on the prior exam. *There is an approximately 4 x 5 mm nodule in the right lung lower lobe, abutting the diaphragmatic pleura (series 5, image 114) also new since the prior study. No new mass or consolidation. No pleural effusion or pneumothorax. Upper Abdomen: Surgically absent gallbladder. There is partially exophytic 5.5 x 5.8 cm cyst arising from the right kidney upper pole. There is tiny sliding hiatal hernia. Remaining visualized upper abdominal viscera within normal limits. Musculoskeletal: The visualized soft tissues of the chest wall are grossly unremarkable. No suspicious osseous lesions. There are mild multilevel degenerative changes in the visualized spine. Since the prior study there is new superior endplate compression deformity of T5 vertebra. No significant retropulsion or spinal canal compromise. Redemonstration of multiple chronic vertebral compression deformities involving T4, T6, T12 and L3 vertebrae. IMPRESSION: 1. There is progressive narrowing of the left lung lingular segment bronchus with resultant increasing subsegmental atelectatic changes in the left lung lingular segment. 2. Interval increase in the solid nodule in the left lung upper lobe, abutting the major fissure. 3. There are several new lung nodules, as described above. These are indeterminate in etiology. Attention on follow-up examination is recommended. 4. Multiple other nonacute observations, as described above. Aortic Atherosclerosis (ICD10-I70.0) and Emphysema (ICD10-J43.9). Electronically Signed   By: Ree Molt M.D.   On: 10/10/2023 14:12     ASSESSMENT AND PLAN: This is a very pleasant 78 years old white female with a stage IA non-small cell lung cancer, adenocarcinoma presented with left upper lobe lung nodule. The patient is status post stereotactic radiotherapy to the left upper lobe lung nodule and she tolerated the procedure well. She is also status  post SBRT to recurrent posterior left upper lobe lung nodule under the care of Dr. Shannon completed in July 2024 The patient is currently on observation and she continues to have shortness of breath at baseline increased with exertion and currently on home oxygen . She had repeat CT scan of the chest performed recently.  I personally independently reviewed the  scan images and discussed the result with the patient and her husband.  Her scan showed progressive narrowing of the left lung the lingular segment bronchus with increased subsegmental atelectasis in the left lung lingular segment.  There was also interval increase in solid nodules in the left upper lung abutting the major fissure and several new lung nodules. Assessment and Plan    Recurrent non-small cell lung cancer of the left upper lobe Recurrent non-small cell lung cancer of the left upper lobe, previously treated with curative radiotherapy in April 2018 and July 2024. Recent CT scan shows narrowing of the left lung lingular bronchus and presence of nodules. Differential diagnosis includes cancer recurrence versus scarring from previous radiation. - Order PET scan to evaluate lung nodules and left lung narrowing. - Consider bronchoscopy to assess the cause of the narrowing if indicated by PET scan results. - Discuss potential treatment options such as radiation or stenting based on PET scan findings. - Schedule follow-up appointment in three weeks to review PET scan results and discuss further management.   She was advised to call immediately if she has any concerning symptoms in the interval.   The patient voices understanding of current disease status and treatment options and is in agreement with the current care plan. Al she was advised to call immediately if she has any concerning symptoms in the interval.  l questions were answered. The patient knows to call the clinic with any problems, questions or concerns. We can certainly see the  patient much sooner if necessary.  Disclaimer: This note was dictated with voice recognition software. Similar sounding words can inadvertently be transcribed and may not be corrected upon review.

## 2023-10-19 NOTE — Telephone Encounter (Signed)
 Scheduled patients next appointment. Called and spoke with the patient, she is aware.

## 2023-10-20 DIAGNOSIS — J42 Unspecified chronic bronchitis: Secondary | ICD-10-CM | POA: Diagnosis not present

## 2023-10-20 DIAGNOSIS — I10 Essential (primary) hypertension: Secondary | ICD-10-CM | POA: Diagnosis not present

## 2023-10-28 ENCOUNTER — Other Ambulatory Visit: Payer: Self-pay

## 2023-10-28 ENCOUNTER — Emergency Department (HOSPITAL_COMMUNITY)

## 2023-10-28 ENCOUNTER — Encounter (HOSPITAL_COMMUNITY): Payer: Self-pay

## 2023-10-28 ENCOUNTER — Encounter (HOSPITAL_COMMUNITY)
Admission: RE | Admit: 2023-10-28 | Discharge: 2023-10-28 | Disposition: A | Discharge status: Home / Self Care | Source: Ambulatory Visit | Attending: Internal Medicine | Admitting: Internal Medicine

## 2023-10-28 ENCOUNTER — Observation Stay (HOSPITAL_COMMUNITY)
Admission: EM | Admit: 2023-10-28 | Discharge: 2023-10-29 | Disposition: A | Discharge status: Home / Self Care | Source: Ambulatory Visit | Attending: Internal Medicine | Admitting: Internal Medicine

## 2023-10-28 DIAGNOSIS — D631 Anemia in chronic kidney disease: Secondary | ICD-10-CM | POA: Diagnosis not present

## 2023-10-28 DIAGNOSIS — I2510 Atherosclerotic heart disease of native coronary artery without angina pectoris: Secondary | ICD-10-CM | POA: Diagnosis not present

## 2023-10-28 DIAGNOSIS — K219 Gastro-esophageal reflux disease without esophagitis: Secondary | ICD-10-CM | POA: Insufficient documentation

## 2023-10-28 DIAGNOSIS — J9611 Chronic respiratory failure with hypoxia: Secondary | ICD-10-CM | POA: Diagnosis not present

## 2023-10-28 DIAGNOSIS — Z7982 Long term (current) use of aspirin: Secondary | ICD-10-CM | POA: Diagnosis not present

## 2023-10-28 DIAGNOSIS — I130 Hypertensive heart and chronic kidney disease with heart failure and stage 1 through stage 4 chronic kidney disease, or unspecified chronic kidney disease: Secondary | ICD-10-CM | POA: Insufficient documentation

## 2023-10-28 DIAGNOSIS — D72829 Elevated white blood cell count, unspecified: Secondary | ICD-10-CM | POA: Diagnosis not present

## 2023-10-28 DIAGNOSIS — I509 Heart failure, unspecified: Secondary | ICD-10-CM | POA: Diagnosis not present

## 2023-10-28 DIAGNOSIS — K2970 Gastritis, unspecified, without bleeding: Secondary | ICD-10-CM | POA: Insufficient documentation

## 2023-10-28 DIAGNOSIS — R918 Other nonspecific abnormal finding of lung field: Secondary | ICD-10-CM | POA: Diagnosis not present

## 2023-10-28 DIAGNOSIS — R7989 Other specified abnormal findings of blood chemistry: Secondary | ICD-10-CM | POA: Diagnosis not present

## 2023-10-28 DIAGNOSIS — R0789 Other chest pain: Principal | ICD-10-CM

## 2023-10-28 DIAGNOSIS — C3412 Malignant neoplasm of upper lobe, left bronchus or lung: Secondary | ICD-10-CM | POA: Insufficient documentation

## 2023-10-28 DIAGNOSIS — N1831 Chronic kidney disease, stage 3a: Secondary | ICD-10-CM

## 2023-10-28 DIAGNOSIS — J439 Emphysema, unspecified: Secondary | ICD-10-CM | POA: Insufficient documentation

## 2023-10-28 DIAGNOSIS — I700 Atherosclerosis of aorta: Secondary | ICD-10-CM | POA: Insufficient documentation

## 2023-10-28 DIAGNOSIS — G4733 Obstructive sleep apnea (adult) (pediatric): Secondary | ICD-10-CM

## 2023-10-28 DIAGNOSIS — D638 Anemia in other chronic diseases classified elsewhere: Secondary | ICD-10-CM | POA: Diagnosis present

## 2023-10-28 DIAGNOSIS — C3492 Malignant neoplasm of unspecified part of left bronchus or lung: Secondary | ICD-10-CM | POA: Diagnosis present

## 2023-10-28 DIAGNOSIS — J449 Chronic obstructive pulmonary disease, unspecified: Secondary | ICD-10-CM | POA: Diagnosis present

## 2023-10-28 DIAGNOSIS — R079 Chest pain, unspecified: Secondary | ICD-10-CM | POA: Diagnosis present

## 2023-10-28 DIAGNOSIS — I10 Essential (primary) hypertension: Secondary | ICD-10-CM | POA: Diagnosis present

## 2023-10-28 DIAGNOSIS — C3490 Malignant neoplasm of unspecified part of unspecified bronchus or lung: Secondary | ICD-10-CM | POA: Insufficient documentation

## 2023-10-28 LAB — COMPREHENSIVE METABOLIC PANEL WITH GFR
ALT: 18 U/L (ref 0–44)
AST: 25 U/L (ref 15–41)
Albumin: 4.9 g/dL (ref 3.5–5.0)
Alkaline Phosphatase: 60 U/L (ref 38–126)
Anion gap: 14 (ref 5–15)
BUN: 34 mg/dL — ABNORMAL HIGH (ref 8–23)
CO2: 26 mmol/L (ref 22–32)
Calcium: 10.3 mg/dL (ref 8.9–10.3)
Chloride: 99 mmol/L (ref 98–111)
Creatinine, Ser: 1.33 mg/dL — ABNORMAL HIGH (ref 0.44–1.00)
GFR, Estimated: 41 mL/min — ABNORMAL LOW (ref >60)
Glucose, Bld: 169 mg/dL — ABNORMAL HIGH (ref 70–99)
Potassium: 4.9 mmol/L (ref 3.5–5.1)
Sodium: 139 mmol/L (ref 135–145)
Total Bilirubin: 0.5 mg/dL (ref 0.0–1.2)
Total Protein: 8.1 g/dL (ref 6.5–8.1)

## 2023-10-28 LAB — TROPONIN T, HIGH SENSITIVITY
Troponin T High Sensitivity: 50 ng/L — ABNORMAL HIGH (ref 0–19)
Troponin T High Sensitivity: 82 ng/L — ABNORMAL HIGH (ref 0–19)
Troponin T High Sensitivity: 105 ng/L (ref 0–19)
Troponin T High Sensitivity: 113 ng/L (ref 0–19)

## 2023-10-28 LAB — URINALYSIS, ROUTINE W REFLEX MICROSCOPIC
Bilirubin Urine: NEGATIVE
Glucose, UA: >=500 mg/dL — AB
Ketones, ur: NEGATIVE mg/dL
Nitrite: NEGATIVE
Protein, ur: 30 mg/dL — AB
Specific Gravity, Urine: 1.027 (ref 1.005–1.030)
pH: 6 (ref 5.0–8.0)

## 2023-10-28 LAB — CBC WITH DIFFERENTIAL/PLATELET
Abs Immature Granulocytes: 0.07 K/uL (ref 0.00–0.07)
Basophils Absolute: 0 K/uL (ref 0.0–0.1)
Basophils Relative: 0 %
Eosinophils Absolute: 0 K/uL (ref 0.0–0.5)
Eosinophils Relative: 0 %
HCT: 38.1 % (ref 36.0–46.0)
Hemoglobin: 11.1 g/dL — ABNORMAL LOW (ref 12.0–15.0)
Immature Granulocytes: 1 %
Lymphocytes Relative: 7 %
Lymphs Abs: 0.9 K/uL (ref 0.7–4.0)
MCH: 31.1 pg (ref 26.0–34.0)
MCHC: 29.1 g/dL — ABNORMAL LOW (ref 30.0–36.0)
MCV: 106.7 fL — ABNORMAL HIGH (ref 80.0–100.0)
Monocytes Absolute: 0.8 K/uL (ref 0.1–1.0)
Monocytes Relative: 6 %
Neutro Abs: 11.6 K/uL — ABNORMAL HIGH (ref 1.7–7.7)
Neutrophils Relative %: 86 %
Platelets: 185 K/uL (ref 150–400)
RBC: 3.57 MIL/uL — ABNORMAL LOW (ref 3.87–5.11)
RDW: 11.6 % (ref 11.5–15.5)
WBC: 13.4 K/uL — ABNORMAL HIGH (ref 4.0–10.5)
nRBC: 0 % (ref 0.0–0.2)

## 2023-10-28 LAB — GLUCOSE, CAPILLARY
Glucose-Capillary: 178 mg/dL — ABNORMAL HIGH (ref 70–99)
Glucose-Capillary: 212 mg/dL — ABNORMAL HIGH (ref 70–99)

## 2023-10-28 LAB — PRO BRAIN NATRIURETIC PEPTIDE: Pro Brain Natriuretic Peptide: 220 pg/mL (ref <300.0)

## 2023-10-28 LAB — HEMOGLOBIN A1C
Hgb A1c MFr Bld: 6.3 % — ABNORMAL HIGH (ref 4.8–5.6)
Mean Plasma Glucose: 134.11 mg/dL

## 2023-10-28 MED ORDER — ASPIRIN 81 MG PO TBEC
81.0000 mg | DELAYED_RELEASE_TABLET | Freq: Every day | ORAL | Status: DC
  Administered 2023-10-29: 81 mg via ORAL
  Filled 2023-10-28: qty 1

## 2023-10-28 MED ORDER — METHOCARBAMOL 500 MG PO TABS
500.0000 mg | ORAL_TABLET | Freq: Two times a day (BID) | ORAL | Status: DC
  Administered 2023-10-28 – 2023-10-29 (×2): 500 mg via ORAL
  Filled 2023-10-28 (×2): qty 1

## 2023-10-28 MED ORDER — ORAL CARE MOUTH RINSE: 15.0000 mL | OROMUCOSAL | Status: DC | PRN

## 2023-10-28 MED ORDER — LOSARTAN POTASSIUM 50 MG PO TABS
50.0000 mg | ORAL_TABLET | Freq: Every morning | ORAL | Status: DC
  Administered 2023-10-29: 50 mg via ORAL
  Filled 2023-10-28: qty 1

## 2023-10-28 MED ORDER — HYDROCODONE-ACETAMINOPHEN 5-325 MG PO TABS
1.0000 | ORAL_TABLET | ORAL | Status: DC | PRN
  Administered 2023-10-29 (×2): 2 via ORAL
  Filled 2023-10-28 (×2): qty 2

## 2023-10-28 MED ORDER — INSULIN ASPART 100 UNIT/ML IJ SOLN
2.0000 [IU] | Freq: Once | INTRAMUSCULAR | Status: AC
  Administered 2023-10-28: 2 [IU] via SUBCUTANEOUS

## 2023-10-28 MED ORDER — FERROUS SULFATE 325 (65 FE) MG PO TABS
650.0000 mg | ORAL_TABLET | Freq: Every day | ORAL | Status: DC
  Administered 2023-10-29: 650 mg via ORAL
  Filled 2023-10-28: qty 2

## 2023-10-28 MED ORDER — HEPARIN (PORCINE) 25000 UT/250ML-% IV SOLN
750.0000 [IU]/h | INTRAVENOUS | Status: DC
Start: 2023-10-28 — End: 2023-10-29
  Administered 2023-10-28: 750 [IU]/h via INTRAVENOUS
  Filled 2023-10-28: qty 250

## 2023-10-28 MED ORDER — NITROGLYCERIN 0.4 MG/HR TD PT24
0.4000 mg | MEDICATED_PATCH | TRANSDERMAL | Status: DC
  Filled 2023-10-28: qty 1

## 2023-10-28 MED ORDER — HEPARIN BOLUS VIA INFUSION
3900.0000 [IU] | Freq: Once | INTRAVENOUS | Status: AC
  Administered 2023-10-28: 3900 [IU] via INTRAVENOUS
  Filled 2023-10-28: qty 3900

## 2023-10-28 MED ORDER — IOHEXOL 350 MG/ML SOLN
60.0000 mL | Freq: Once | INTRAVENOUS | Status: AC | PRN
  Administered 2023-10-28: 60 mL via INTRAVENOUS

## 2023-10-28 MED ORDER — HYDROMORPHONE HCL 1 MG/ML IJ SOLN
0.5000 mg | Freq: Once | INTRAMUSCULAR | Status: AC
  Administered 2023-10-28: 0.5 mg via INTRAVENOUS
  Filled 2023-10-28: qty 1

## 2023-10-28 MED ORDER — FLUDEOXYGLUCOSE F - 18 (FDG) INJECTION
8.0000 | Freq: Once | INTRAVENOUS | Status: AC | PRN
  Administered 2023-10-28: 7.8 via INTRAVENOUS

## 2023-10-28 MED ORDER — LORATADINE 10 MG PO TABS
10.0000 mg | ORAL_TABLET | Freq: Every day | ORAL | Status: DC
  Administered 2023-10-29: 10 mg via ORAL
  Filled 2023-10-28: qty 1

## 2023-10-28 MED ORDER — ACETAMINOPHEN 650 MG RE SUPP: 650.0000 mg | Freq: Four times a day (QID) | RECTAL | Status: DC | PRN

## 2023-10-28 MED ORDER — PREDNISONE 5 MG PO TABS: 5.0000 mg | ORAL_TABLET | ORAL | Status: DC

## 2023-10-28 MED ORDER — BUSPIRONE HCL 10 MG PO TABS
10.0000 mg | ORAL_TABLET | Freq: Two times a day (BID) | ORAL | Status: DC
  Administered 2023-10-28 – 2023-10-29 (×2): 10 mg via ORAL
  Filled 2023-10-28 (×2): qty 1

## 2023-10-28 MED ORDER — CLOPIDOGREL BISULFATE 75 MG PO TABS
75.0000 mg | ORAL_TABLET | Freq: Every morning | ORAL | Status: DC
  Administered 2023-10-29: 75 mg via ORAL
  Filled 2023-10-28: qty 1

## 2023-10-28 MED ORDER — PREDNISONE 10 MG PO TABS
10.0000 mg | ORAL_TABLET | ORAL | Status: DC
  Administered 2023-10-29: 10 mg via ORAL
  Filled 2023-10-28: qty 1

## 2023-10-28 MED ORDER — ALBUTEROL SULFATE (2.5 MG/3ML) 0.083% IN NEBU: 2.5000 mg | INHALATION_SOLUTION | RESPIRATORY_TRACT | Status: DC | PRN

## 2023-10-28 MED ORDER — FLUTICASONE FUROATE-VILANTEROL 100-25 MCG/ACT IN AEPB
1.0000 | INHALATION_SPRAY | Freq: Every day | RESPIRATORY_TRACT | Status: DC
  Administered 2023-10-29: 1 via RESPIRATORY_TRACT
  Filled 2023-10-28: qty 28

## 2023-10-28 MED ORDER — METOPROLOL SUCCINATE ER 25 MG PO TB24
25.0000 mg | ORAL_TABLET | Freq: Every day | ORAL | Status: DC
  Administered 2023-10-29: 25 mg via ORAL
  Filled 2023-10-28: qty 1

## 2023-10-28 MED ORDER — AMLODIPINE BESYLATE 10 MG PO TABS
5.0000 mg | ORAL_TABLET | Freq: Every day | ORAL | Status: DC
  Administered 2023-10-29: 5 mg via ORAL
  Filled 2023-10-28: qty 1

## 2023-10-28 MED ORDER — EMPAGLIFLOZIN 25 MG PO TABS
25.0000 mg | ORAL_TABLET | Freq: Every day | ORAL | Status: DC
  Administered 2023-10-29: 25 mg via ORAL
  Filled 2023-10-28: qty 1

## 2023-10-28 MED ORDER — INSULIN ASPART 100 UNIT/ML IJ SOLN
0.0000 [IU] | Freq: Three times a day (TID) | INTRAMUSCULAR | Status: DC
  Administered 2023-10-29: 1 [IU] via SUBCUTANEOUS
  Administered 2023-10-29: 2 [IU] via SUBCUTANEOUS
  Filled 2023-10-28: qty 0.09

## 2023-10-28 MED ORDER — ATORVASTATIN CALCIUM 20 MG PO TABS
20.0000 mg | ORAL_TABLET | Freq: Every day | ORAL | Status: DC
  Administered 2023-10-28: 20 mg via ORAL
  Filled 2023-10-28: qty 1

## 2023-10-28 MED ORDER — PANTOPRAZOLE SODIUM 40 MG IV SOLR
40.0000 mg | Freq: Once | INTRAVENOUS | Status: AC
  Administered 2023-10-28: 40 mg via INTRAVENOUS
  Filled 2023-10-28: qty 10

## 2023-10-28 MED ORDER — POLYETHYLENE GLYCOL 3350 17 G PO PACK
17.0000 g | PACK | Freq: Every day | ORAL | Status: DC
  Administered 2023-10-29: 17 g via ORAL
  Filled 2023-10-28: qty 1

## 2023-10-28 MED ORDER — ACETAMINOPHEN 325 MG PO TABS: 650.0000 mg | ORAL_TABLET | Freq: Four times a day (QID) | ORAL | Status: DC | PRN

## 2023-10-28 NOTE — Assessment & Plan Note (Signed)
 Continue home medications of  Nitrodur patch every other day, cozaar  50mg , norvasc  5mg  and toprol  25mg  daily

## 2023-10-28 NOTE — Progress Notes (Addendum)
 The patient arrived to the unit alert, oriented x4 and ambulatory 1 assist. She denies CP or worsening SOB at this time.

## 2023-10-28 NOTE — Progress Notes (Signed)
 PHARMACY - ANTICOAGULATION CONSULT NOTE  Pharmacy Consult for heparin  Indication: chest pain/ACS  No Known Allergies  Patient Measurements: Weight: 71.6 kg (157 lb 14.4 oz) Heparin  dosing weight: 65 kg  Vital Signs: Temp: 97.4 F (36.3 C) (10/03 1500) Temp Source: Oral (10/03 1500) BP: 148/66 (10/03 1500) Pulse Rate: 93 (10/03 1500)  Labs: Recent Labs    10/28/23 1049  HGB 11.1*  HCT 38.1  PLT 185  CREATININE 1.33*    Estimated Creatinine Clearance: 32.8 mL/min (A) (by C-G formula based on SCr of 1.33 mg/dL (H)).   Medical History: Past Medical History:  Diagnosis Date   Adenocarcinoma of left lung, 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   I'm in remission (04/05/2017)   Adenocarcinoma, lung (HCC)    Anxiety    Arthritis    legs, elbows (04/05/2017)   Bronchitis, chronic (HCC)    haven't had it in awhile (04/05/2017)   CHF (congestive heart failure) (HCC)    COPD (chronic obstructive pulmonary disease) (HCC)    Coronary artery disease    Daily headache    recently (04/05/2017)   Depression    Dyspnea    with exertion   GERD (gastroesophageal reflux disease)    History of blood transfusion 1950s   History of radiation therapy 05/04/16 - 05/12/16   Left lung treated to 54 Gy with 3 fx of 18 Gy   History of radiation therapy    Left Lung- 08/02/22-08/17/22- Dr. Lynwood Nasuti   Hypertension    On home oxygen  therapy    2L; 24/7 (04/05/2017)   OSA (obstructive sleep apnea)    OSA on CPAP    Pneumonia 10/2015   at least 3 times (04/05/2017)   Pulmonary hypertension (HCC)    Medications: No anticoagulants PTA  Assessment: Pt is a 37 yoF presenting with chest pain. Cardiology consulted. Pharmacy consulted to dose heparin  for ACS.   Today, 10/28/23 CBC: Hgb slightly low, Plt WNL Troponin: 50 > 82  Goal: Heparin  level 0.3 - 0.7  Plan: Heparin  bolus of 3900 units IV once Heparin  infusion of 750 units/hr Check 8 hour heparin  level CBC, heparin  level  daily Monitor for signs of bleeding  Ronal CHRISTELLA Rav, PharmD 10/28/2023,4:56 PM

## 2023-10-28 NOTE — ED Triage Notes (Signed)
 Pt arrived from nuclear medicine for sudden onset extreme chest pain.  Pt has hx of cancer  4L baseline,

## 2023-10-28 NOTE — ED Notes (Signed)
 Patient transported to CT

## 2023-10-28 NOTE — Assessment & Plan Note (Addendum)
 78 year old female presenting with complaints of intermittent chest pain x 1 week worse with exertion and that was worse today with her PET scan found to have elevated troponin and history concerning for ACS in setting of known CAD s/p PCI to RCA in April 2019  -obs to tele  -troponin: 60>82, continue to cycle -check echo -cardiology consulted and recommended heparin  drip and nuclear stress test -continue ASA/plavix  and statin and nitropatch  -continue beta blocker  -Prn pain medication  -also history concerning for GERD, continue PPI, may need GI if heart work up wnl

## 2023-10-28 NOTE — Assessment & Plan Note (Signed)
 Likely secondary to chronic steroid use No signs of infection. CXR normal, Ua pending

## 2023-10-28 NOTE — Assessment & Plan Note (Signed)
 Diagnosed march 2018 S/P RT to the left lung nodule under the care of Dr. Shannon completed on 05/04/2016. 2) status post curative radiotherapy to the posterior left upper lobe lung nodule under the care of Dr. Shannon completed on August 17, 2022 Recent CT scan of her chest showed progressive narrowing of the left lung the lingular segment bronchus with increased subsegmental atelectasis in the left lung lingular segment.  There was also interval increase in solid nodules in the left upper lung abutting the major fissure and several new lung nodules. Differential includes cancer recurrence vs. Scarring s/p radiation She is followed by oncology. Had her PET scan today but was unable to complete due to chest pain and now hospital admission

## 2023-10-28 NOTE — Assessment & Plan Note (Signed)
 Baseline hgb around 11, stable Continue oral iron

## 2023-10-28 NOTE — Assessment & Plan Note (Signed)
 No longer on cpap per patient

## 2023-10-28 NOTE — Assessment & Plan Note (Signed)
 On baseline 5L oxygen , stable No signs of COPD exacerbation  Continue chronic steroids, breo and SABA prn

## 2023-10-28 NOTE — Plan of Care (Signed)

## 2023-10-28 NOTE — H&P (Signed)
 History and Physical    Patient: Suzanne Dickson FMW:[id number] DOB: 29-Apr-1945 DOA: 10/28/2023 DOS: the patient was seen and examined on 10/28/2023 PCP: Earvin Johnston PARAS, FNP  Patient coming from: radiology - lives with her husband and step son. Uses walker to ambulate    Chief Complaint: chest pain   HPI: Suzanne Dickson is a 78 y.o. female with medical history significant of lung cancer, COPD with chronic respiratory failure on 5L oxygen , CHF, CAD s/p DES, GERD, OSA on cpap with complaints of intermittent chest pain over the last 1-2 weeks. Chest pain is in the center of her chest, no radiation. She states it usually only lasts a few seconds, but recently it has been lasting longer unless she puts biofreeze on it, tylenol . She would get the pain with exertion. She has not gotten short of breath, nauseated or diaphoretic with the pain. She was getting a PET scan today and after she got fluid in her arm the chest pain was bad that they brought her to ED. She states she has no more pain after she got the pain medication.   She states she has been having more belching. Pain not correlated with food. She was given pepcid  which worked well for a few days then stopped working.   Denies any fever/chills, vision changes/headaches, palpitations, shortness of breath or cough, abdominal pain, N/V/D, dysuria or leg swelling.    She does not smoke or drink alcohol.   ER Course:  vitals: afebrile, bp: 141/100, HR: 110, RR: 20, oxygen : 97% on 5L [place] Pertinent labs: WBC: 13.4, hgb: 11.1, BUN: 34, creatinine: 1.33, troponin 60>82, pro bnp: 220 CXR: Increased parahilar opacity in the left lung may be treatment related given the fiducial markers in the region of the left hilum. Correlation with same day PET-CT report recommended. CTA chest: no definite PE, stable left perihilar and lingular opacity is noted which may represent atelectasis or postoperative scarring or fibrosis. Underlying malignancy can not be  excluded. Stable 11x29mm nodule is noted posteriorly in the LUL. Coronary artery calcifications.  In ED: given dilaudid and protonix . TRH asked to admit.     Review of Systems: As mentioned in the history of present illness. All other systems reviewed and are negative. Past Medical History:  Diagnosis Date   Adenocarcinoma of left lung, 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   I'm in remission (04/05/2017)   Adenocarcinoma, lung (HCC)    Anxiety    Arthritis    legs, elbows (04/05/2017)   Bronchitis, chronic (HCC)    haven't had it in awhile (04/05/2017)   CHF (congestive heart failure) (HCC)    COPD (chronic obstructive pulmonary disease) (HCC)    Coronary artery disease    Daily headache    recently (04/05/2017)   Depression    Dyspnea    with exertion   GERD (gastroesophageal reflux disease)    History of blood transfusion 1950s   History of radiation therapy 05/04/16 - 05/12/16   Left lung treated to 54 Gy with 3 fx of 18 Gy   History of radiation therapy    Left Lung- 08/02/22-08/17/22- Dr. Lynwood Nasuti   Hypertension    On home oxygen  therapy    2L; 24/7 (04/05/2017)   OSA (obstructive sleep apnea)    OSA on CPAP    Pneumonia 10/2015   at least 3 times (04/05/2017)   Pulmonary hypertension (HCC)    Past Surgical History:  Procedure Laterality Date   COLONOSCOPY  CORONARY ANGIOPLASTY WITH STENT PLACEMENT  04/05/2017   CORONARY STENT INTERVENTION N/A 04/05/2017   Procedure: CORONARY STENT INTERVENTION;  Surgeon: Levern Hutching, MD;  Location: [facility];  Service: Cardiovascular;  Laterality: N/A;   DILATION AND CURETTAGE OF UTERUS     FUDUCIAL PLACEMENT Left 03/31/2016   Procedure: PLACEMENT OF FUDUCIAL LEFT UPPER LOBE;  Surgeon: Lamar GORMAN Chris, MD;  Location: [facility] OR;  Service: Thoracic;  Laterality: Left;   LAPAROSCOPIC CHOLECYSTECTOMY     LEFT HEART CATH AND CORONARY ANGIOGRAPHY N/A 02/17/2017   Procedure: LEFT HEART CATH AND CORONARY ANGIOGRAPHY;  Surgeon:  Levern Hutching, MD;  Location: [facility];  Service: Cardiovascular;  Laterality: N/A;   SINUSOTOMY     TONSILLECTOMY     TUBAL LIGATION     VIDEO BRONCHOSCOPY WITH ENDOBRONCHIAL NAVIGATION N/A 03/31/2016   Procedure: VIDEO BRONCHOSCOPY WITH ENDOBRONCHIAL NAVIGATION;  Surgeon: Lamar GORMAN Chris, MD;  Location: [facility] OR;  Service: Thoracic;  Laterality: N/A;   Social History:  reports that she quit smoking about 9 years ago. Her smoking use included cigarettes. She started smoking about 49 years ago. She has a 40 pack-year smoking history. She has never used smokeless tobacco. She reports that she does not drink alcohol and does not use drugs.  No Known Allergies  Family History  Problem Relation Age of Onset   Heart attack Mother    Heart attack Father     Prior to Admission medications   Medication Sig Start Date End Date Taking? Authorizing Provider  acetaminophen  (TYLENOL ) 500 MG tablet Take 1 tablet (500 mg total) by mouth every 6 (six) hours as needed. Patient taking differently: Take 1,000 mg by mouth in the morning and at bedtime. 03/22/21  Yes Charlyn Sora, MD  albuterol  (PROVENTIL ) (2.5 MG/3ML) 0.083% nebulizer solution Take 3 mLs (2.5 mg total) by nebulization every 4 (four) hours as needed for wheezing or shortness of breath. 09/29/23  Yes Darlean Ozell NOVAK, MD  albuterol  (VENTOLIN  HFA) 108 (90 Base) MCG/ACT inhaler Inhale 2 puffs into the lungs 2 (two) times daily as needed for wheezing or shortness of breath. 08/25/23  Yes Cobb, Comer GAILS, NP  amLODipine  (NORVASC ) 5 MG tablet Take 5 mg by mouth daily.   Yes [provider]  ascorbic acid (VITAMIN C) 1000 MG tablet Take 1,000 mg by mouth daily.   Yes [provider]  aspirin  EC 81 MG tablet Take 81 mg by mouth daily. Swallow whole.   Yes [provider]  atorvastatin  (LIPITOR) 20 MG tablet Take 20 mg by mouth at bedtime. 08/21/20  Yes [provider]  BREO ELLIPTA  100-25 MCG/ACT AEPB INHALE 1  PUFF BY MOUTH IN THE MORNING 09/20/23  Yes Darlean Ozell NOVAK, MD  busPIRone  (BUSPAR ) 10 MG tablet Take 10 mg by mouth 2 (two) times daily. 09/14/23  Yes [provider]  cetirizine (ZYRTEC) 10 MG tablet Take 10 mg by mouth at bedtime.   Yes [provider]  cholecalciferol  (VITAMIN D3) 25 MCG (1000 UNIT) tablet Take 1,000 Units by mouth daily.   Yes [provider]  clopidogrel  (PLAVIX ) 75 MG tablet Take 75 mg by mouth every morning.    Yes [provider]  Cyanocobalamin (B-12 PO) Take 1 tablet by mouth daily.   Yes [provider]  ferrous sulfate  325 (65 FE) MG EC tablet Take 650 mg by mouth daily.   Yes [provider]  fluticasone  (FLONASE ) 50 MCG/ACT nasal spray Place 2 sprays  into both nostrils daily. Patient taking differently: Place 2 sprays into both nostrils daily as needed for allergies or rhinitis. 03/02/23  Yes Cobb, Comer GAILS, NP  JARDIANCE 25 MG TABS tablet Take 25 mg by mouth daily. 01/20/23  Yes [provider]  losartan  (COZAAR ) 50 MG tablet Take 50 mg by mouth every morning. 11/15/18  Yes [provider]  methocarbamol  (ROBAXIN ) 500 MG tablet Take 500 mg by mouth in the morning and at bedtime. 05/14/21  Yes [provider]  metoprolol  succinate (TOPROL -XL) 25 MG 24 hr tablet Take 25 mg by mouth daily.   Yes [provider]  nitroGLYCERIN  (NITRODUR - DOSED IN MG/24 HR) 0.4 mg/hr patch Place 0.4 mg onto the skin every other day. 08/10/17  Yes [provider]  nitroGLYCERIN  (NITROSTAT ) 0.4 MG SL tablet Place 0.4 mg under the tongue every 5 (five) minutes as needed for chest pain.   Yes [provider]  omeprazole (PRILOSEC) 20 MG capsule Take 20 mg by mouth daily.   Yes [provider]  OXYGEN  Inhale 4-7 L into the lungs continuous.   Yes [provider]  polyethylene glycol powder (GLYCOLAX /MIRALAX ) 17 GM/SCOOP powder Take 119 g by mouth daily.   Yes [provider]  Polyvinyl Alcohol-Povidone (REFRESH OP) Place 1 drop into both eyes daily as needed (dry eyes).   Yes [provider]  predniSONE  (DELTASONE ) 10 MG tablet TAKE 10MG  EVERY OTHER DAY, ALTERNATING WITH 5MG  Patient taking differently: Take 5-10 mg by mouth See admin instructions. TAKE 10MG  EVERY OTHER DAY, ALTERNATING WITH 5MG  08/31/23  Yes Darlean Ozell NOVAK, MD    Physical Exam: Vitals:   10/28/23 1050 10/28/23 1130 10/28/23 1500 10/28/23 1652  BP:  (!) 163/72 (!) 148/66   Pulse:  88 93   Resp:  17 17   Temp:   (!) 97.4 F (36.3 C)   TempSrc:   Oral   SpO2: 100% 100% 96%   Weight:    71.6 kg   General:  Appears calm and comfortable and is in NAD. Tearful at times  Eyes:  PERRL, EOMI, normal lids, iris ENT:  grossly normal hearing, lips & tongue, mmm; appropriate dentition Neck:  no LAD, masses or thyromegaly; no carotid bruits Cardiovascular:  RRR, no m/r/g. No LE edema.  Respiratory:   CTA bilaterally with no wheezes/rales/rhonchi.  Normal respiratory effort. Abdomen:  soft, NT, ND, NABS Back:   normal alignment, no CVAT Skin:  no rash or induration seen on limited exam Musculoskeletal:  grossly normal tone BUE/BLE, good ROM, no bony abnormality Lower extremity:  No LE edema.  Limited foot exam with no ulcerations.  2+ distal pulses. Psychiatric:  grossly normal mood and affect, speech fluent and appropriate, AOx3 Neurologic:  CN 2-12 grossly intact, moves all extremities in coordinated fashion, sensation intact   Radiological Exams on Admission: Independently reviewed - see discussion in A/P where applicable  CT Angio Chest PE W and/or Wo Contrast Result Date: 10/28/2023 CLINICAL DATA:  Chest pain.  History of lung cancer. EXAM: CT ANGIOGRAPHY CHEST WITH CONTRAST TECHNIQUE: Multidetector CT imaging of the chest was performed using the standard protocol during bolus administration of intravenous contrast. Multiplanar CT image reconstructions and MIPs were  obtained to evaluate the vascular anatomy. RADIATION DOSE REDUCTION: This exam was performed according to the departmental dose-optimization program which includes automated exposure control, adjustment of the mA and/or kV according to patient size and/or use of iterative reconstruction technique. CONTRAST:  60mL OMNIPAQUE  IOHEXOL   350 MG/ML SOLN COMPARISON:  October 10, 2023. FINDINGS: Cardiovascular: Satisfactory opacification of the pulmonary arteries to the segmental level. No evidence of pulmonary embolism. Mild cardiomegaly. Coronary artery calcifications are noted. Atherosclerosis of thoracic aorta is noted without aneurysm formation. No pericardial effusion. Mediastinum/Nodes: No enlarged mediastinal, hilar, or axillary lymph nodes. Thyroid gland, trachea, and esophagus demonstrate no significant findings. Lungs/Pleura: No pneumothorax or pleural effusion is noted. Mild emphysematous disease is noted. Probable scarring is noted anteriorly in right upper lobe. Stable left perihilar and lingular opacity is noted which may represent either atelectasis or postoperative scarring or fibrosis. Underlying malignancy cannot be excluded. Fiducial marker is noted. Stable 11 x 6 mm nodule is noted posteriorly in left upper lobe adjacent to major fissure seen on image number 54 series 12. Upper Abdomen: No acute abnormality. Musculoskeletal: No acute osseous abnormality is noted. Review of the MIP images confirms the above findings. IMPRESSION: 1. No definite evidence of pulmonary embolus. 2. Stable left perihilar and lingular opacity is noted which may represent atelectasis or postoperative scarring or fibrosis. Underlying malignancy cannot be excluded. 3. Stable 11 x 6 mm nodule is noted posteriorly in left upper lobe adjacent to major fissure. 4. Coronary artery calcifications are noted. Aortic Atherosclerosis (ICD10-I70.0) and Emphysema (ICD10-J43.9). Electronically Signed   By: Lynwood Landy Raddle M.D.   On: 10/28/2023  14:22   DG Chest Port 1 View Result Date: 10/28/2023 CLINICAL DATA:  Pain. EXAM: PORTABLE CHEST 1 VIEW COMPARISON:  03/30/2023 FINDINGS: The cardio pericardial silhouette is enlarged. Increased parahilar opacity in the left lung may be treatment related given the fiducial markers in the region of the left hilum left findings appear progressive since 03/30/2023. The cardiopericardial silhouette is within normal limits for size. Hazy opacity over the lung bases likely superimposition of soft tissues. No substantial pleural effusion. No pulmonary edema. IMPRESSION: Increased parahilar opacity in the left lung may be treatment related given the fiducial markers in the region of the left hilum. Correlation with same day PET-CT report recommended. Electronically Signed   By: Camellia Candle M.D.   On: 10/28/2023 11:07    EKG: Independently reviewed.  NSR with rate 110; nonspecific ST changes with no evidence of acute ischemia,. PVC.    Labs on Admission: I have personally reviewed the available labs and imaging studies at the time of the admission.  Pertinent labs:   WBC: 13.4,  hgb: 11.1,  BUN: 34,  creatinine: 1.33,  troponin 60>82,  pro bnp: 220  Assessment and Plan: Principal Problem:   Chest pain Active Problems:   Leukocytosis   CKD stage 3a, GFR 45-59 ml/min (HCC)   Anemia of chronic disease   Adenocarcinoma of left lung, stage 1 (HCC)   COPD GOLD III/ 02 dep with chronic respiratory failure on 5L oxygen  [place]   Essential hypertension   OSA on CPAP   Chronic respiratory failure with hypoxia (HCC)   CAD (coronary artery disease)    Assessment and Plan: * Chest pain 78 year old female presenting with complaints of intermittent chest pain x 1 week worse with exertion and that was worse today with her PET scan found to have elevated troponin and history concerning for ACS in setting of known CAD s/p PCI to RCA in April 2019  -obs to tele  -troponin: 60>82, continue to cycle -check  echo -cardiology consulted and recommended heparin  drip and nuclear stress test -continue ASA/plavix  and statin and nitropatch  -continue beta blocker  -Prn pain medication  -also  history concerning for GERD, continue PPI, may need GI if heart work up wnl   Leukocytosis Likely secondary to chronic steroid use No signs of infection. CXR normal, Ua pending   CKD stage 3a, GFR 45-59 ml/min (HCC) Baseline creatinine of around 1.1  Slightly bumped to 1.3 today, but she has not had anything to eat or drink today Continue to watch closely   Anemia of chronic disease Baseline hgb around 11, stable Continue oral iron   Adenocarcinoma of left lung, stage 1 (HCC) Diagnosed march 2018 S/P RT to the left lung nodule under the care of Dr. Shannon completed on 05/04/2016. 2) status post curative radiotherapy to the posterior left upper lobe lung nodule under the care of Dr. Shannon completed on August 17, 2022 Recent CT scan of her chest showed progressive narrowing of the left lung the lingular segment bronchus with increased subsegmental atelectasis in the left lung lingular segment.  There was also interval increase in solid nodules in the left upper lung abutting the major fissure and several new lung nodules. Differential includes cancer recurrence vs. Scarring s/p radiation She is followed by oncology. Had her PET scan today but was unable to complete due to chest pain and now hospital admission    COPD GOLD III/ 02 dep with chronic respiratory failure on 5L oxygen  [place] On baseline 5L oxygen , stable No signs of COPD exacerbation  Continue chronic steroids, breo and SABA prn   Essential hypertension Continue home medications of  Nitrodur patch every other day, cozaar  50mg , norvasc  5mg  and toprol  25mg  daily   OSA on CPAP No longer on cpap per patient     Advance Care Planning:   Code Status: Limited: Do not attempt resuscitation (DNR) -DNR-LIMITED -Do Not Intubate/DNI    Consults:  cardiology: Dr. Levern   DVT Prophylaxis: heparin  gtt   Family Communication: none   Severity of Illness: The appropriate patient status for this patient is OBSERVATION. Observation status is judged to be reasonable and necessary in order to provide the required intensity of service to ensure the patient's safety. The patient's presenting symptoms, physical exam findings, and initial radiographic and laboratory data in the context of their medical condition is felt to place them at decreased risk for further clinical deterioration. Furthermore, it is anticipated that the patient will be medically stable for discharge from the hospital within 2 midnights of admission.   Author: Isaiah Geralds, MD 10/28/2023 4:54 PM  For on call review [email or web address].

## 2023-10-28 NOTE — ED Provider Notes (Signed)
 [place] EMERGENCY DEPARTMENT AT [facility] Provider Note   CSN: [id number] Arrival date & time: 10/28/23  1043     Patient presents with: Chest Pain   Suzanne Dickson is a 78 y.o. female.   Patient is with cardiology disease and lung cancer.  She was getting a PET scan today and was having severe chest pain.  Patient has been having pain off and on for couple weeks.  She has 2 stents in her heart.  The history is provided by the patient and medical records. No language interpreter was used.  Chest Pain Pain location:  L chest Pain quality: aching   Pain radiates to:  Does not radiate Pain severity:  Moderate Onset quality:  Sudden Timing:  Constant Progression:  Worsening Chronicity:  New Context: not breathing   Relieved by:  Nothing Associated symptoms: no abdominal pain, no back pain, no cough, no fatigue and no headache        Prior to Admission medications   Medication Sig Start Date End Date Taking? Authorizing Provider  acetaminophen  (TYLENOL ) 500 MG tablet Take 1 tablet (500 mg total) by mouth every 6 (six) hours as needed. Patient taking differently: Take 1,000 mg by mouth in the morning and at bedtime. 03/22/21  Yes Charlyn Sora, MD  albuterol  (PROVENTIL ) (2.5 MG/3ML) 0.083% nebulizer solution Take 3 mLs (2.5 mg total) by nebulization every 4 (four) hours as needed for wheezing or shortness of breath. 09/29/23  Yes Darlean Ozell NOVAK, MD  albuterol  (VENTOLIN  HFA) 108 (90 Base) MCG/ACT inhaler Inhale 2 puffs into the lungs 2 (two) times daily as needed for wheezing or shortness of breath. 08/25/23  Yes Cobb, Comer GAILS, NP  amLODipine  (NORVASC ) 5 MG tablet Take 5 mg by mouth daily.   Yes [provider]  ascorbic acid (VITAMIN C) 1000 MG tablet Take 1,000 mg by mouth daily.   Yes [provider]  aspirin  EC 81 MG tablet Take 81 mg by mouth daily. Swallow whole.   Yes [provider]  atorvastatin  (LIPITOR) 20 MG tablet Take 20  mg by mouth at bedtime. 08/21/20  Yes [provider]  BREO ELLIPTA  100-25 MCG/ACT AEPB INHALE 1 PUFF BY MOUTH IN THE MORNING 09/20/23  Yes Darlean Ozell NOVAK, MD  busPIRone  (BUSPAR ) 10 MG tablet Take 10 mg by mouth 2 (two) times daily. 09/14/23  Yes [provider]  cetirizine (ZYRTEC) 10 MG tablet Take 10 mg by mouth at bedtime.   Yes [provider]  cholecalciferol  (VITAMIN D3) 25 MCG (1000 UNIT) tablet Take 1,000 Units by mouth daily.   Yes [provider]  clopidogrel  (PLAVIX ) 75 MG tablet Take 75 mg by mouth every morning.    Yes [provider]  Cyanocobalamin (B-12 PO) Take 1 tablet by mouth daily.   Yes [provider]  ferrous sulfate  325 (65 FE) MG EC tablet Take 650 mg by mouth daily.   Yes [provider]  fluticasone  (FLONASE ) 50 MCG/ACT nasal spray Place 2 sprays into both nostrils daily. Patient taking differently: Place 2 sprays into both nostrils daily as needed for allergies or rhinitis. 03/02/23  Yes Cobb, Comer GAILS, NP  JARDIANCE 25 MG TABS tablet Take 25 mg by mouth daily. 01/20/23  Yes [provider]  losartan  (COZAAR ) 50 MG tablet Take 50 mg by mouth every morning. 11/15/18  Yes [provider]  methocarbamol  (ROBAXIN ) 500 MG tablet Take 500 mg by mouth in the morning and at bedtime.  05/14/21  Yes [provider]  metoprolol  succinate (TOPROL -XL) 25 MG 24 hr tablet Take 25 mg by mouth daily.   Yes [provider]  nitroGLYCERIN  (NITRODUR - DOSED IN MG/24 HR) 0.4 mg/hr patch Place 0.4 mg onto the skin every other day. 08/10/17  Yes [provider]  nitroGLYCERIN  (NITROSTAT ) 0.4 MG SL tablet Place 0.4 mg under the tongue every 5 (five) minutes as needed for chest pain.   Yes [provider]  omeprazole (PRILOSEC) 20 MG capsule Take 20 mg by mouth daily.   Yes [provider]  OXYGEN  Inhale 4-7 L into the lungs continuous.   Yes [provider]   polyethylene glycol powder (GLYCOLAX /MIRALAX ) 17 GM/SCOOP powder Take 119 g by mouth daily.   Yes [provider]  Polyvinyl Alcohol-Povidone (REFRESH OP) Place 1 drop into both eyes daily as needed (dry eyes).   Yes [provider]  predniSONE  (DELTASONE ) 10 MG tablet TAKE 10MG  EVERY OTHER DAY, ALTERNATING WITH 5MG  Patient taking differently: Take 5-10 mg by mouth See admin instructions. TAKE 10MG  EVERY OTHER DAY, ALTERNATING WITH 5MG  08/31/23  Yes Darlean Ozell NOVAK, MD    Allergies: Patient has no known allergies.    Review of Systems  Constitutional:  Negative for appetite change and fatigue.  HENT:  Negative for congestion, ear discharge and sinus pressure.   Eyes:  Negative for discharge.  Respiratory:  Negative for cough.   Cardiovascular:  Positive for chest pain.  Gastrointestinal:  Negative for abdominal pain and diarrhea.  Genitourinary:  Negative for frequency and hematuria.  Musculoskeletal:  Negative for back pain.  Skin:  Negative for rash.  Neurological:  Negative for seizures and headaches.  Psychiatric/Behavioral:  Negative for hallucinations.     Updated Vital Signs BP (!) 163/72   Pulse 88   Temp (!) 97.4 F (36.3 C) (Oral)   Resp 17   SpO2 100%   Physical Exam Vitals and nursing note reviewed.  Constitutional:      Appearance: She is well-developed.  HENT:     Head: Normocephalic.     Nose: Nose normal.  Eyes:     General: No scleral icterus.    Conjunctiva/sclera: Conjunctivae normal.  Neck:     Thyroid: No thyromegaly.  Cardiovascular:     Rate and Rhythm: Normal rate and regular rhythm.     Heart sounds: No murmur heard.    No friction rub. No gallop.  Pulmonary:     Breath sounds: No stridor. No wheezing or rales.  Chest:     Chest wall: No tenderness.  Abdominal:     General: There is no distension.     Tenderness: There is no abdominal tenderness. There is no rebound.  Musculoskeletal:        General: Normal range of  motion.     Cervical back: Neck supple.  Lymphadenopathy:     Cervical: No cervical adenopathy.  Skin:    Findings: No erythema or rash.  Neurological:     Mental Status: She is alert and oriented to person, place, and time.     Motor: No abnormal muscle tone.     Coordination: Coordination normal.  Psychiatric:        Behavior: Behavior normal.     (all labs ordered are listed, but only abnormal results are displayed) Labs Reviewed  CBC WITH DIFFERENTIAL/PLATELET - Abnormal; Notable for the following components:      Result Value   WBC 13.4 (*)  RBC 3.57 (*)    Hemoglobin 11.1 (*)    MCV 106.7 (*)    MCHC 29.1 (*)    Neutro Abs 11.6 (*)    All other components within normal limits  COMPREHENSIVE METABOLIC PANEL WITH GFR - Abnormal; Notable for the following components:   Glucose, Bld 169 (*)    BUN 34 (*)    Creatinine, Ser 1.33 (*)    GFR, Estimated 41 (*)    All other components within normal limits  TROPONIN T, HIGH SENSITIVITY - Abnormal; Notable for the following components:   Troponin T High Sensitivity 50 (*)    All other components within normal limits  TROPONIN T, HIGH SENSITIVITY - Abnormal; Notable for the following components:   Troponin T High Sensitivity 82 (*)    All other components within normal limits  PRO BRAIN NATRIURETIC PEPTIDE    EKG: None  Radiology: CT Angio Chest PE W and/or Wo Contrast Result Date: 10/28/2023 CLINICAL DATA:  Chest pain.  History of lung cancer. EXAM: CT ANGIOGRAPHY CHEST WITH CONTRAST TECHNIQUE: Multidetector CT imaging of the chest was performed using the standard protocol during bolus administration of intravenous contrast. Multiplanar CT image reconstructions and MIPs were obtained to evaluate the vascular anatomy. RADIATION DOSE REDUCTION: This exam was performed according to the departmental dose-optimization program which includes automated exposure control, adjustment of the mA and/or kV according to patient size  and/or use of iterative reconstruction technique. CONTRAST:  60mL OMNIPAQUE  IOHEXOL  350 MG/ML SOLN COMPARISON:  October 10, 2023. FINDINGS: Cardiovascular: Satisfactory opacification of the pulmonary arteries to the segmental level. No evidence of pulmonary embolism. Mild cardiomegaly. Coronary artery calcifications are noted. Atherosclerosis of thoracic aorta is noted without aneurysm formation. No pericardial effusion. Mediastinum/Nodes: No enlarged mediastinal, hilar, or axillary lymph nodes. Thyroid gland, trachea, and esophagus demonstrate no significant findings. Lungs/Pleura: No pneumothorax or pleural effusion is noted. Mild emphysematous disease is noted. Probable scarring is noted anteriorly in right upper lobe. Stable left perihilar and lingular opacity is noted which may represent either atelectasis or postoperative scarring or fibrosis. Underlying malignancy cannot be excluded. Fiducial marker is noted. Stable 11 x 6 mm nodule is noted posteriorly in left upper lobe adjacent to major fissure seen on image number 54 series 12. Upper Abdomen: No acute abnormality. Musculoskeletal: No acute osseous abnormality is noted. Review of the MIP images confirms the above findings. IMPRESSION: 1. No definite evidence of pulmonary embolus. 2. Stable left perihilar and lingular opacity is noted which may represent atelectasis or postoperative scarring or fibrosis. Underlying malignancy cannot be excluded. 3. Stable 11 x 6 mm nodule is noted posteriorly in left upper lobe adjacent to major fissure. 4. Coronary artery calcifications are noted. Aortic Atherosclerosis (ICD10-I70.0) and Emphysema (ICD10-J43.9). Electronically Signed   By: Lynwood Landy Raddle M.D.   On: 10/28/2023 14:22   DG Chest Port 1 View Result Date: 10/28/2023 CLINICAL DATA:  Pain. EXAM: PORTABLE CHEST 1 VIEW COMPARISON:  03/30/2023 FINDINGS: The cardio pericardial silhouette is enlarged. Increased parahilar opacity in the left lung may be treatment  related given the fiducial markers in the region of the left hilum left findings appear progressive since 03/30/2023. The cardiopericardial silhouette is within normal limits for size. Hazy opacity over the lung bases likely superimposition of soft tissues. No substantial pleural effusion. No pulmonary edema. IMPRESSION: Increased parahilar opacity in the left lung may be treatment related given the fiducial markers in the region of the left hilum. Correlation with same day PET-CT report  recommended. Electronically Signed   By: Camellia Candle M.D.   On: 10/28/2023 11:07     Procedures   Medications Ordered in the ED  HYDROmorphone (DILAUDID) injection 0.5 mg (0.5 mg Intravenous Given 10/28/23 1054)  pantoprazole  (PROTONIX ) injection 40 mg (40 mg Intravenous Given 10/28/23 1354)  iohexol  (OMNIPAQUE ) 350 MG/ML injection 60 mL (60 mLs Intravenous Contrast Given 10/28/23 1346)  I spoke with Dr. Levern and he recommended hospitalist admission and he will consult today    CRITICAL CARE Performed by: Fairy Sermon Total critical care time: 45 minutes Critical care time was exclusive of separately billable procedures and treating other patients. Critical care was necessary to treat or prevent imminent or life-threatening deterioration. Critical care was time spent personally by me on the following activities: development of treatment plan with patient and/or surrogate as well as nursing, discussions with consultants, evaluation of patient's response to treatment, examination of patient, obtaining history from patient or surrogate, ordering and performing treatments and interventions, ordering and review of laboratory studies, ordering and review of radiographic studies, pulse oximetry and re-evaluation of patient's condition.                                  Medical Decision Making Amount and/or Complexity of Data Reviewed Labs: ordered. Radiology: ordered. ECG/medicine tests:  ordered.  Risk Prescription drug management.   Patient with chest pain and elevated troponins.  She will be admitted to medicine with cardiology consult     Final diagnoses:  None    ED Discharge Orders     None          Sermon Fairy, MD 10/28/23 1452

## 2023-10-28 NOTE — Assessment & Plan Note (Signed)
 Baseline creatinine of around 1.1  Slightly bumped to 1.3 today, but she has not had anything to eat or drink today Continue to watch closely

## 2023-10-28 NOTE — Consult Note (Signed)
 Reason for Consult: Chest pain with minimally elevated high-sensitivity troponin I Referring Physician: EDP  Suzanne Dickson is an 78 y.o. female.  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  EKG done in the ED showed further ST depression in lateral leads as compared to EKG done in February of this year and was noted to have minimally elevated troponin I.  Patient also states chest pain feels like coming from acid reflux has not used any nitro in the last week since she is having recurrent chest pain.  Patient's activity is limited due to multiple comorbidities.  Patient also had CT of the chest today which showed possible recurrent adenocarcinoma in right lung.  Past Medical History:  Diagnosis Date   Adenocarcinoma of left lung, 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   I'm in remission (04/05/2017)   Adenocarcinoma, lung (HCC)    Anxiety    Arthritis    legs, elbows (04/05/2017)   Bronchitis, chronic (HCC)    haven't had it in awhile (04/05/2017)   CHF (congestive heart failure) (HCC)    COPD (chronic obstructive pulmonary disease) (HCC)    Coronary artery disease    Daily headache    recently (04/05/2017)   Depression    Dyspnea    with exertion   GERD (gastroesophageal reflux disease)    History of blood transfusion 1950s   History of radiation therapy 05/04/16 - 05/12/16   Left lung treated to 54 Gy with 3 fx of 18 Gy   History of  radiation therapy    Left Lung- 08/02/22-08/17/22- Dr. Lynwood Nasuti   Hypertension    On home oxygen  therapy    2L; 24/7 (04/05/2017)   OSA (obstructive sleep apnea)    OSA on CPAP    Pneumonia 10/2015   at least 3 times (04/05/2017)   Pulmonary hypertension (HCC)     Past Surgical History:  Procedure Laterality Date   COLONOSCOPY     CORONARY ANGIOPLASTY WITH STENT PLACEMENT  04/05/2017   CORONARY STENT INTERVENTION N/A 04/05/2017   Procedure: CORONARY STENT INTERVENTION;  Surgeon: Levern Hutching, MD;  Location: [facility];  Service: Cardiovascular;  Laterality: N/A;   DILATION AND CURETTAGE OF UTERUS     FUDUCIAL PLACEMENT Left 03/31/2016   Procedure: PLACEMENT OF FUDUCIAL LEFT UPPER LOBE;  Surgeon: Lamar GORMAN Chris, MD;  Location: [facility] OR;  Service: Thoracic;  Laterality: Left;   LAPAROSCOPIC CHOLECYSTECTOMY     LEFT HEART CATH AND CORONARY ANGIOGRAPHY N/A 02/17/2017   Procedure: LEFT HEART CATH AND CORONARY ANGIOGRAPHY;  Surgeon: Levern Hutching, MD;  Location: [facility];  Service: Cardiovascular;  Laterality: N/A;   SINUSOTOMY     TONSILLECTOMY     TUBAL LIGATION     VIDEO BRONCHOSCOPY WITH ENDOBRONCHIAL NAVIGATION N/A 03/31/2016   Procedure: VIDEO BRONCHOSCOPY WITH ENDOBRONCHIAL NAVIGATION;  Surgeon: Lamar GORMAN Chris, MD;  Location: [facility] OR;  Service: Thoracic;  Laterality: N/A;    Family History  Problem Relation Age of Onset   Heart attack Mother  Heart attack Father     Social History:  reports that she quit smoking about 9 years ago. Her smoking use included cigarettes. She started smoking about 49 years ago. She has a 40 pack-year smoking history. She has never used smokeless tobacco. She reports that she does not drink alcohol and does not use drugs.  Allergies: No Known Allergies  Medications: I have reviewed the patient's current medications.  Results for orders placed or performed during the hospital encounter of 10/28/23 (from the past 48 hours)  CBC  with Differential     Status: Abnormal   Collection Time: 10/28/23 10:49 AM  Result Value Ref Range   WBC 13.4 (H) 4.0 - 10.5 K/uL   RBC 3.57 (L) 3.87 - 5.11 MIL/uL   Hemoglobin 11.1 (L) 12.0 - 15.0 g/dL   HCT 61.8 63.9 - 53.9 %   MCV 106.7 (H) 80.0 - 100.0 fL   MCH 31.1 26.0 - 34.0 pg   MCHC 29.1 (L) 30.0 - 36.0 g/dL   RDW 88.3 88.4 - 84.4 %   Platelets 185 150 - 400 K/uL   nRBC 0.0 0.0 - 0.2 %   Neutrophils Relative % 86 %   Neutro Abs 11.6 (H) 1.7 - 7.7 K/uL   Lymphocytes Relative 7 %   Lymphs Abs 0.9 0.7 - 4.0 K/uL   Monocytes Relative 6 %   Monocytes Absolute 0.8 0.1 - 1.0 K/uL   Eosinophils Relative 0 %   Eosinophils Absolute 0.0 0.0 - 0.5 K/uL   Basophils Relative 0 %   Basophils Absolute 0.0 0.0 - 0.1 K/uL   Immature Granulocytes 1 %   Abs Immature Granulocytes 0.07 0.00 - 0.07 K/uL    Comment: Performed at [facility], [address], [place], [place] [address]  Comprehensive metabolic panel     Status: Abnormal   Collection Time: 10/28/23 10:49 AM  Result Value Ref Range   Sodium 139 135 - 145 mmol/L   Potassium 4.9 3.5 - 5.1 mmol/L   Chloride 99 98 - 111 mmol/L   CO2 26 22 - 32 mmol/L   Glucose, Bld 169 (H) 70 - 99 mg/dL    Comment: Glucose reference range applies only to samples taken after fasting for at least 8 hours.   BUN 34 (H) 8 - 23 mg/dL   Creatinine, Ser 8.66 (H) 0.44 - 1.00 mg/dL   Calcium  10.3 8.9 - 10.3 mg/dL   Total Protein 8.1 6.5 - 8.1 g/dL   Albumin 4.9 3.5 - 5.0 g/dL   AST 25 15 - 41 U/L    Comment: HEMOLYSIS AT THIS LEVEL MAY AFFECT RESULT   ALT 18 0 - 44 U/L   Alkaline Phosphatase 60 38 - 126 U/L   Total Bilirubin 0.5 0.0 - 1.2 mg/dL   GFR, Estimated 41 (L) >60 mL/min    Comment: (NOTE) Calculated using the CKD-EPI Creatinine Equation (2021)    Anion gap 14 5 - 15    Comment: Performed at [facility], [address] [place], [place] [address]  Troponin T, High Sensitivity     Status: Abnormal    Collection Time: 10/28/23 10:49 AM  Result Value Ref Range   Troponin T High Sensitivity 50 (H) 0 - 19 ng/L    Comment: (NOTE) Biotin concentrations > 1000 ng/mL falsely decrease TnT results.  Serial cardiac troponin measurements are suggested.  Refer to the Links section for chest pain algorithms and additional  guidance. Performed at [facility], [address], [place], [place] [address]   Pro Brain natriuretic peptide     Status: None   Collection Time: 10/28/23 11:03 AM  Result Value Ref Range   Pro Brain Natriuretic Peptide 220.0 <300.0 pg/mL    Comment: (NOTE) Age Group        Cut-Points    Interpretation  < 50 years     450 pg/mL       NT-proBNP > 450 pg/mL indicates                                ADHF is likely              50 to 75 years  900 pg/mL      NT-proBNP > 900 pg/mL indicates          ADHF is likely  > 75 years      1800 pg/mL     NT-proBNP > 1800 pg/mL indicates          ADHF is likely                           All ages    Results between       Indeterminate. Further clinical             300 and the cut-   information is needed to determine            point for age group   if ADHF is present.                                                             Elecsys proBNP II/ Elecsys proBNP II STAT           Cut-Point                       Interpretation  300 pg/mL                    NT-proBNP <300pg/mL indicates                             ADHF is not likely  Performed at [facility], [address] [place], [place] [address]   Troponin T, High Sensitivity     Status: Abnormal   Collection Time: 10/28/23  1:04 PM  Result Value Ref Range   Troponin T High Sensitivity 82 (H) 0 - 19 ng/L    Comment: Delta check noted  (NOTE) Biotin concentrations > 1000 ng/mL falsely decrease TnT results.  Serial cardiac troponin measurements are suggested.  Refer to the Links section for chest pain algorithms and additional   guidance. Performed at [facility], [address] [place], [place] [address]     CT Angio Chest PE W and/or Wo Contrast Result Date: 10/28/2023 CLINICAL DATA:  Chest pain.  History of lung cancer. EXAM: CT ANGIOGRAPHY CHEST WITH CONTRAST TECHNIQUE: Multidetector CT imaging of the chest was performed using the standard protocol during bolus administration of intravenous contrast. Multiplanar CT image reconstructions and MIPs were obtained to evaluate the vascular  anatomy. RADIATION DOSE REDUCTION: This exam was performed according to the departmental dose-optimization program which includes automated exposure control, adjustment of the mA and/or kV according to patient size and/or use of iterative reconstruction technique. CONTRAST:  60mL OMNIPAQUE  IOHEXOL  350 MG/ML SOLN COMPARISON:  October 10, 2023. FINDINGS: Cardiovascular: Satisfactory opacification of the pulmonary arteries to the segmental level. No evidence of pulmonary embolism. Mild cardiomegaly. Coronary artery calcifications are noted. Atherosclerosis of thoracic aorta is noted without aneurysm formation. No pericardial effusion. Mediastinum/Nodes: No enlarged mediastinal, hilar, or axillary lymph nodes. Thyroid gland, trachea, and esophagus demonstrate no significant findings. Lungs/Pleura: No pneumothorax or pleural effusion is noted. Mild emphysematous disease is noted. Probable scarring is noted anteriorly in right upper lobe. Stable left perihilar and lingular opacity is noted which may represent either atelectasis or postoperative scarring or fibrosis. Underlying malignancy cannot be excluded. Fiducial marker is noted. Stable 11 x 6 mm nodule is noted posteriorly in left upper lobe adjacent to major fissure seen on image number 54 series 12. Upper Abdomen: No acute abnormality. Musculoskeletal: No acute osseous abnormality is noted. Review of the MIP images confirms the above findings. IMPRESSION: 1. No definite  evidence of pulmonary embolus. 2. Stable left perihilar and lingular opacity is noted which may represent atelectasis or postoperative scarring or fibrosis. Underlying malignancy cannot be excluded. 3. Stable 11 x 6 mm nodule is noted posteriorly in left upper lobe adjacent to major fissure. 4. Coronary artery calcifications are noted. Aortic Atherosclerosis (ICD10-I70.0) and Emphysema (ICD10-J43.9). Electronically Signed   By: Lynwood Landy Raddle M.D.   On: 10/28/2023 14:22   DG Chest Port 1 View Result Date: 10/28/2023 CLINICAL DATA:  Pain. EXAM: PORTABLE CHEST 1 VIEW COMPARISON:  03/30/2023 FINDINGS: The cardio pericardial silhouette is enlarged. Increased parahilar opacity in the left lung may be treatment related given the fiducial markers in the region of the left hilum left findings appear progressive since 03/30/2023. The cardiopericardial silhouette is within normal limits for size. Hazy opacity over the lung bases likely superimposition of soft tissues. No substantial pleural effusion. No pulmonary edema. IMPRESSION: Increased parahilar opacity in the left lung may be treatment related given the fiducial markers in the region of the left hilum. Correlation with same day PET-CT report recommended. Electronically Signed   By: Camellia Candle M.D.   On: 10/28/2023 11:07    Review of Systems  Constitutional:  Negative for diaphoresis.  HENT:  Negative for sore throat.   Eyes:  Negative for discharge.  Respiratory:  Negative for shortness of breath.   Cardiovascular:  Positive for chest pain. Negative for palpitations and leg swelling.  Gastrointestinal:  Negative for abdominal distention.  Genitourinary:  Negative for difficulty urinating.  Neurological:  Negative for dizziness.  Psychiatric/Behavioral:  The patient is nervous/anxious.    Blood pressure (!) 148/66, pulse 93, temperature (!) 97.4 F (36.3 C), temperature source Oral, resp. rate 17, SpO2 96%. Physical Exam Constitutional:       Appearance: She is well-developed and normal weight.  HENT:     Head: Normocephalic and atraumatic.  Neck:     Vascular: No JVD.  Cardiovascular:     Rate and Rhythm: Regular rhythm. Tachycardia present.     Heart sounds: Murmur heard.     Systolic (2/6 systolic murmur noted) murmur is present.     No friction rub. No gallop.  Pulmonary:     Effort: Pulmonary effort is normal.     Breath sounds: Normal breath sounds.  Abdominal:  General: Bowel sounds are normal.     Palpations: Abdomen is soft.  Musculoskeletal:     Cervical back: Normal range of motion and neck supple.     Right lower leg: No tenderness. No edema.     Left lower leg: No tenderness. No edema.  Skin:    General: Skin is warm and dry.  Neurological:     Mental Status: She is alert.     Assessment/Plan: Acute coronary syndrome with some component of GERD Coronary artery disease history of PCI and March 2019 to RCA Hypertension Diabetes mellitus Hyperlipidemia GERD Recurrent adenocarcinoma of the lung COPD Obstructive sleep apnea Degenerative joint disease Plan Check serial enzymes, lipid panel and EKG Continue home medicines Start IV heparin  and Nitropaste Discussed with patient regarding various options of treatment medical versus noninvasive nuclear stress testing if no further elevation of cardiac enzymes. Agrees for nuclear stress test, which could be done as outpatient as nuclear stress test are not done during the weekend Dr. Claudene on-call for me for the weekend  Levern Hutching 10/28/2023, 3:50 PM

## 2023-10-29 ENCOUNTER — Other Ambulatory Visit (HOSPITAL_COMMUNITY): Payer: Self-pay

## 2023-10-29 ENCOUNTER — Observation Stay (HOSPITAL_COMMUNITY)

## 2023-10-29 ENCOUNTER — Other Ambulatory Visit: Payer: Self-pay

## 2023-10-29 DIAGNOSIS — R0789 Other chest pain: Secondary | ICD-10-CM | POA: Diagnosis not present

## 2023-10-29 DIAGNOSIS — I2510 Atherosclerotic heart disease of native coronary artery without angina pectoris: Secondary | ICD-10-CM | POA: Diagnosis not present

## 2023-10-29 DIAGNOSIS — D72829 Elevated white blood cell count, unspecified: Secondary | ICD-10-CM | POA: Diagnosis not present

## 2023-10-29 DIAGNOSIS — C3412 Malignant neoplasm of upper lobe, left bronchus or lung: Secondary | ICD-10-CM | POA: Diagnosis not present

## 2023-10-29 DIAGNOSIS — E119 Type 2 diabetes mellitus without complications: Secondary | ICD-10-CM | POA: Diagnosis not present

## 2023-10-29 DIAGNOSIS — K219 Gastro-esophageal reflux disease without esophagitis: Secondary | ICD-10-CM

## 2023-10-29 DIAGNOSIS — D631 Anemia in chronic kidney disease: Secondary | ICD-10-CM | POA: Diagnosis not present

## 2023-10-29 DIAGNOSIS — J9611 Chronic respiratory failure with hypoxia: Secondary | ICD-10-CM | POA: Diagnosis not present

## 2023-10-29 DIAGNOSIS — J449 Chronic obstructive pulmonary disease, unspecified: Secondary | ICD-10-CM | POA: Diagnosis not present

## 2023-10-29 DIAGNOSIS — I425 Other restrictive cardiomyopathy: Secondary | ICD-10-CM | POA: Diagnosis not present

## 2023-10-29 DIAGNOSIS — N1831 Chronic kidney disease, stage 3a: Secondary | ICD-10-CM | POA: Diagnosis not present

## 2023-10-29 DIAGNOSIS — G4733 Obstructive sleep apnea (adult) (pediatric): Secondary | ICD-10-CM | POA: Diagnosis not present

## 2023-10-29 DIAGNOSIS — I249 Acute ischemic heart disease, unspecified: Secondary | ICD-10-CM | POA: Diagnosis not present

## 2023-10-29 LAB — CBC
HCT: 36.5 % (ref 36.0–46.0)
Hemoglobin: 10.6 g/dL — ABNORMAL LOW (ref 12.0–15.0)
MCH: 31.6 pg (ref 26.0–34.0)
MCHC: 29 g/dL — ABNORMAL LOW (ref 30.0–36.0)
MCV: 109 fL — ABNORMAL HIGH (ref 80.0–100.0)
Platelets: 168 K/uL (ref 150–400)
RBC: 3.35 MIL/uL — ABNORMAL LOW (ref 3.87–5.11)
RDW: 11.9 % (ref 11.5–15.5)
WBC: 9.9 K/uL (ref 4.0–10.5)
nRBC: 0 % (ref 0.0–0.2)

## 2023-10-29 LAB — ECHOCARDIOGRAM COMPLETE
Area-P 1/2: 3.46 cm2
Height: 62 in
P 1/2 time: 434 ms
S' Lateral: 2.7 cm
Weight: 2553.81 [oz_av]

## 2023-10-29 LAB — HEPARIN LEVEL (UNFRACTIONATED): Heparin Unfractionated: 0.54 [IU]/mL (ref 0.30–0.70)

## 2023-10-29 LAB — BASIC METABOLIC PANEL WITH GFR
Anion gap: 12 (ref 5–15)
BUN: 32 mg/dL — ABNORMAL HIGH (ref 8–23)
CO2: 29 mmol/L (ref 22–32)
Calcium: 9.8 mg/dL (ref 8.9–10.3)
Chloride: 102 mmol/L (ref 98–111)
Creatinine, Ser: 1.29 mg/dL — ABNORMAL HIGH (ref 0.44–1.00)
GFR, Estimated: 43 mL/min — ABNORMAL LOW (ref >60)
Glucose, Bld: 98 mg/dL (ref 70–99)
Potassium: 4.2 mmol/L (ref 3.5–5.1)
Sodium: 143 mmol/L (ref 135–145)

## 2023-10-29 LAB — GLUCOSE, CAPILLARY
Glucose-Capillary: 123 mg/dL — ABNORMAL HIGH (ref 70–99)
Glucose-Capillary: 198 mg/dL — ABNORMAL HIGH (ref 70–99)

## 2023-10-29 LAB — TROPONIN T, HIGH SENSITIVITY
Troponin T High Sensitivity: 124 ng/L (ref 0–19)
Troponin T High Sensitivity: 132 ng/L (ref 0–19)

## 2023-10-29 MED ORDER — ISOSORBIDE MONONITRATE ER 30 MG PO TB24
15.0000 mg | ORAL_TABLET | Freq: Every day | ORAL | Status: DC
  Administered 2023-10-29: 15 mg via ORAL
  Filled 2023-10-29: qty 1

## 2023-10-29 MED ORDER — ALUM & MAG HYDROXIDE-SIMETH 200-200-20 MG/5ML PO SUSP
30.0000 mL | ORAL | Status: DC | PRN
  Administered 2023-10-29 (×2): 30 mL via ORAL
  Filled 2023-10-29 (×2): qty 30

## 2023-10-29 MED ORDER — PANTOPRAZOLE SODIUM 40 MG PO TBEC
40.0000 mg | DELAYED_RELEASE_TABLET | Freq: Two times a day (BID) | ORAL | 0 refills | Status: AC
  Filled 2023-10-29: qty 60, 30d supply, fill #0

## 2023-10-29 MED ORDER — SUCRALFATE 1 GM/10ML PO SUSP
1.0000 g | Freq: Three times a day (TID) | ORAL | 0 refills | Status: DC
  Filled 2023-10-29: qty 420, 11d supply, fill #0

## 2023-10-29 MED ORDER — SUCRALFATE 1 GM/10ML PO SUSP
1.0000 g | Freq: Three times a day (TID) | ORAL | Status: DC
  Administered 2023-10-29: 1 g via ORAL
  Filled 2023-10-29: qty 10

## 2023-10-29 MED ORDER — ALUM & MAG HYDROXIDE-SIMETH 200-200-20 MG/5ML PO SUSP: 30.0000 mL | ORAL | Status: AC | PRN

## 2023-10-29 MED ORDER — HYDROCODONE-ACETAMINOPHEN 5-325 MG PO TABS
1.0000 | ORAL_TABLET | ORAL | 0 refills | Status: AC | PRN
  Filled 2023-10-29: qty 10, 1d supply, fill #0

## 2023-10-29 MED ORDER — PANTOPRAZOLE SODIUM 40 MG PO TBEC: 40.0000 mg | DELAYED_RELEASE_TABLET | Freq: Every day | ORAL | Status: DC

## 2023-10-29 MED ORDER — ISOSORBIDE MONONITRATE ER 30 MG PO TB24
15.0000 mg | ORAL_TABLET | Freq: Every day | ORAL | 0 refills | Status: DC
  Filled 2023-10-29: qty 30, 60d supply, fill #0

## 2023-10-29 MED ORDER — PANTOPRAZOLE SODIUM 40 MG PO TBEC
40.0000 mg | DELAYED_RELEASE_TABLET | Freq: Two times a day (BID) | ORAL | Status: DC
  Administered 2023-10-29: 40 mg via ORAL
  Filled 2023-10-29: qty 1

## 2023-10-29 NOTE — Discharge Summary (Signed)
 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  Diet recommendation: Small frequent meals  Wound care: None.  Code status: Full.   History of Present Illness:   Suzanne Dickson is a 78 y.o. female with medical history significant of lung cancer, COPD with chronic respiratory failure on 5L oxygen , CHF, CAD s/p DES, GERD, OSA on cpap with complaints of intermittent chest pain over the last 1-2 weeks. Chest pain is in the center of her chest, no radiation. She states it usually only lasts a few seconds, but recently it has been lasting longer unless she puts biofreeze on it, tylenol . She would get the pain with exertion. She has not gotten short of breath, nauseated or diaphoretic with the pain. She was getting a PET scan today and after she got fluid in her arm the chest pain was bad that they brought her to ED. She states she has no more pain after she got the pain medication.   She states she has been having more belching. Pain not correlated with food. She was given pepcid  which worked well for a few days then stopped working.    Denies any fever/chills, vision changes/headaches, palpitations, shortness of breath or cough, abdominal  pain, N/V/D, dysuria or leg swelling.      She does not smoke or drink alcohol.    Hospital Course by Problem:   GERD/gastritis -Improved on p.o. Protonix  as well as Carafate - Referral placed to GI-not sure patient is a candidate for an EGD with her respiratory issues  Elevated troponins -Cardiology consult: DC IV heparin . Long acting nitroglycerin . OP PET scan and NM myocardial perfusion stress test next week or so. F/U Dr. Levern in 1 week.   Chronic respiratory failure - Resume home O2   Medical Consultants:   Cardiology   Discharge Exam:   Vitals:   10/29/23 0457 10/29/23 1316  BP: (!) 157/73 120/60  Pulse: 85 92  Resp: 16 20  Temp: 98.5 F (36.9 C) 98.1 F (36.7 C)  SpO2: 100% 97%   Vitals:   10/29/23 0030 10/29/23 0234 10/29/23 0457 10/29/23 1316  BP: 137/63 96/79 (!) 157/73 120/60  Pulse: 85  85 92  Resp: 18 18 16 20   Temp: 98.4 F (36.9 C) 98.2 F (36.8 C) 98.5 F (36.9 C) 98.1 F (36.7 C)  TempSrc: Oral Oral Oral   SpO2: 100% 95% 100% 97%  Weight:      Height:        General exam: Appears calm and comfortable.   The results of significant diagnostics from this hospitalization (including imaging, microbiology, ancillary and laboratory) are listed below for reference.     Procedures and Diagnostic Studies:  ECHOCARDIOGRAM COMPLETE Result Date: 10/29/2023    ECHOCARDIOGRAM REPORT   Patient Name:   Suzanne Dickson Date of Exam: 10/29/2023 Medical Rec #:  [id number]       Height:       62.0 in Accession #:    [id number]      Weight:       159.6 lb Date of Birth:  [birth year]      BSA:          1.737 m Patient Age:    77 years        BP:           157/73 mmHg Patient Gender: F               HR:           90 bpm. Exam Location:  Inpatient Procedure: 2D Echo, Cardiac Doppler and Color Doppler (Both Spectral and Color            Flow Doppler were utilized during procedure). Indications:     Chest Pain  History:         Patient has prior history of  Echocardiogram examinations, most                  recent 08/13/2022. CAD, COPD and PAD; Risk Factors:Hypertension                  and Sleep Apnea. CKD.  Sonographer:     Philomena Daring Referring Phys:  [id number] ISAIAH GERALDS Diagnosing Phys: Salena Negri MD IMPRESSIONS  1. Left ventricular ejection fraction, by estimation, is 55 to 60%. The left ventricle has normal function. The left ventricle has no regional wall motion abnormalities. Left ventricular diastolic parameters are consistent with Grade I diastolic dysfunction (impaired relaxation).  2. Right ventricular systolic function is normal. The right ventricular size is normal.  3. Right atrial size was mildly dilated.  4. The mitral valve is degenerative. Trivial mitral valve regurgitation.  5. The aortic valve is tricuspid. There is mild calcification of the aortic valve. Aortic valve regurgitation is mild. Aortic valve sclerosis is present, with no evidence of aortic valve stenosis.  6. The inferior vena cava is normal in size with greater than 50% respiratory variability, suggesting right atrial pressure of 3 mmHg. FINDINGS  Left Ventricle: Left ventricular ejection fraction, by estimation, is 55 to 60%. The left ventricle has normal function. The left ventricle has no regional wall motion abnormalities. The left ventricular internal cavity size was normal in size. There is  borderline concentric left ventricular hypertrophy. Left ventricular diastolic parameters are consistent with Grade I diastolic dysfunction (impaired relaxation). Right Ventricle: The right ventricular size is normal. No increase in right ventricular wall thickness. Right ventricular systolic function is normal. Left Atrium: Left atrial size was normal in size. Right Atrium: Right atrial size was mildly dilated. Pericardium: There is no evidence of pericardial effusion. Mitral Valve: The mitral valve is degenerative in appearance. Trivial mitral valve regurgitation. Tricuspid Valve: The  tricuspid valve is normal in structure. Tricuspid valve regurgitation is mild. Aortic Valve: The aortic valve is tricuspid. There is mild calcification of the aortic valve. There is mild aortic valve annular calcification. Aortic valve regurgitation is mild. Aortic regurgitation PHT measures 434 msec. Aortic valve sclerosis is present, with no evidence of aortic valve stenosis. Pulmonic Valve: The pulmonic valve was normal in structure. Pulmonic valve regurgitation is not visualized. Aorta: The aortic root is normal in size and structure. There  is minimal (Grade I) atheroma plaque involving the aortic root. Venous: The inferior vena cava is normal in size with greater than 50% respiratory variability, suggesting right atrial pressure of 3 mmHg. IAS/Shunts: The atrial septum is grossly normal.  LEFT VENTRICLE PLAX 2D LVIDd:         3.90 cm   Diastology LVIDs:         2.70 cm   LV e' medial:    5.00 cm/s LV PW:         1.10 cm   LV E/e' medial:  8.6 LV IVS:        1.00 cm   LV e' lateral:   7.94 cm/s LVOT diam:     2.00 cm   LV E/e' lateral: 5.4 LV SV:         56 LV SV Index:   32 LVOT Area:     3.14 cm  RIGHT VENTRICLE             IVC RV Basal diam:  3.30 cm     IVC diam: 1.70 cm RV Mid diam:    2.40 cm RV S prime:     11.40 cm/s TAPSE (M-mode): 1.8 cm LEFT ATRIUM           Index        RIGHT ATRIUM           Index LA diam:      3.10 cm 1.78 cm/m   RA Area:     13.40 cm LA Vol (A2C): 32.0 ml 18.42 ml/m  RA Volume:   32.10 ml  18.48 ml/m LA Vol (A4C): 37.0 ml 21.30 ml/m  AORTIC VALVE LVOT Vmax:   101.00 cm/s LVOT Vmean:  64.500 cm/s LVOT VTI:    0.177 m AI PHT:      434 msec  AORTA Ao Root diam: 3.10 cm Ao Asc diam:  3.00 cm MITRAL VALVE               TRICUSPID VALVE MV Area (PHT): 3.46 cm    TR Peak grad:   49.6 mmHg MV Decel Time: 219 msec    TR Vmax:        352.00 cm/s MV E velocity: 43.20 cm/s MV A velocity: 80.40 cm/s  SHUNTS MV E/A ratio:  0.54        Systemic VTI:  0.18 m                             Systemic Diam: 2.00 cm Salena Negri MD Electronically signed by Salena Negri MD Signature Date/Time: 10/29/2023/9:57:41 AM    Final    CT Angio Chest PE W and/or Wo Contrast Result Date: 10/28/2023 CLINICAL DATA:  Chest pain.  History of lung cancer. EXAM: CT ANGIOGRAPHY CHEST WITH CONTRAST TECHNIQUE: Multidetector CT imaging of the chest was performed using the standard protocol during bolus administration of intravenous contrast. Multiplanar CT image reconstructions and MIPs were obtained to evaluate the vascular anatomy. RADIATION DOSE REDUCTION: This exam was performed according to the departmental dose-optimization program which includes automated exposure control, adjustment of the mA and/or kV according to patient size and/or use of iterative reconstruction technique. CONTRAST:  60mL OMNIPAQUE  IOHEXOL  350 MG/ML SOLN COMPARISON:  October 10, 2023. FINDINGS: Cardiovascular: Satisfactory opacification of the pulmonary arteries to the segmental level. No evidence of pulmonary embolism. Mild cardiomegaly. Coronary artery calcifications are noted. Atherosclerosis of thoracic aorta is noted without aneurysm  formation. No pericardial effusion. Mediastinum/Nodes: No enlarged mediastinal, hilar, or axillary lymph nodes. Thyroid gland, trachea, and esophagus demonstrate no significant findings. Lungs/Pleura: No pneumothorax or pleural effusion is noted. Mild emphysematous disease is noted. Probable scarring is noted anteriorly in right upper lobe. Stable left perihilar and lingular opacity is noted which may represent either atelectasis or postoperative scarring or fibrosis. Underlying malignancy cannot be excluded. Fiducial marker is noted. Stable 11 x 6 mm nodule is noted posteriorly in left upper lobe adjacent to major fissure seen on image number 54 series 12. Upper Abdomen: No acute abnormality. Musculoskeletal: No acute osseous abnormality is noted. Review of the MIP images confirms the above findings.  IMPRESSION: 1. No definite evidence of pulmonary embolus. 2. Stable left perihilar and lingular opacity is noted which may represent atelectasis or postoperative scarring or fibrosis. Underlying malignancy cannot be excluded. 3. Stable 11 x 6 mm nodule is noted posteriorly in left upper lobe adjacent to major fissure. 4. Coronary artery calcifications are noted. Aortic Atherosclerosis (ICD10-I70.0) and Emphysema (ICD10-J43.9). Electronically Signed   By: Lynwood Landy Raddle M.D.   On: 10/28/2023 14:22   DG Chest Port 1 View Result Date: 10/28/2023 CLINICAL DATA:  Pain. EXAM: PORTABLE CHEST 1 VIEW COMPARISON:  03/30/2023 FINDINGS: The cardio pericardial silhouette is enlarged. Increased parahilar opacity in the left lung may be treatment related given the fiducial markers in the region of the left hilum left findings appear progressive since 03/30/2023. The cardiopericardial silhouette is within normal limits for size. Hazy opacity over the lung bases likely superimposition of soft tissues. No substantial pleural effusion. No pulmonary edema. IMPRESSION: Increased parahilar opacity in the left lung may be treatment related given the fiducial markers in the region of the left hilum. Correlation with same day PET-CT report recommended. Electronically Signed   By: Camellia Candle M.D.   On: 10/28/2023 11:07     Labs:   Basic Metabolic Panel: Recent Labs  Lab 10/28/23 1049 10/29/23 0403  NA 139 143  K 4.9 4.2  CL 99 102  CO2 26 29  GLUCOSE 169* 98  BUN 34* 32*  CREATININE 1.33* 1.29*  CALCIUM  10.3 9.8   GFR Estimated Creatinine Clearance: 34 mL/min (A) (by C-G formula based on SCr of 1.29 mg/dL (H)). Liver Function Tests: Recent Labs  Lab 10/28/23 1049  AST 25  ALT 18  ALKPHOS 60  BILITOT 0.5  PROT 8.1  ALBUMIN 4.9   No results for input(s): LIPASE, AMYLASE in the last 168 hours. No results for input(s): AMMONIA in the last 168 hours. Coagulation profile No results for input(s):  INR, PROTIME in the last 168 hours.  CBC: Recent Labs  Lab 10/28/23 1049 10/29/23 0403  WBC 13.4* 9.9  NEUTROABS 11.6*  --   HGB 11.1* 10.6*  HCT 38.1 36.5  MCV 106.7* 109.0*  PLT 185 168   Cardiac Enzymes: No results for input(s): CKTOTAL, CKMB, CKMBINDEX, TROPONINI in the last 168 hours. BNP: Invalid input(s): POCBNP CBG: Recent Labs  Lab 10/28/23 0925 10/28/23 2103 10/29/23 0810 10/29/23 1121  GLUCAP 178* 212* 123* 198*   D-Dimer No results for input(s): DDIMER in the last 72 hours. Hgb A1c Recent Labs    10/28/23 1913  HGBA1C 6.3*   Lipid Profile No results for input(s): CHOL, HDL, LDLCALC, TRIG, CHOLHDL, LDLDIRECT in the last 72 hours. Thyroid function studies No results for input(s): TSH, T4TOTAL, T3FREE, THYROIDAB in the last 72 hours.  Invalid input(s): FREET3 Anemia work up No results for  input(s): VITAMINB12, FOLATE, FERRITIN, TIBC, IRON, RETICCTPCT in the last 72 hours. Microbiology No results found for this or any previous visit (from the past 240 hours).   Discharge Instructions:   Discharge Instructions     Ambulatory referral to Gastroenterology   Complete by: As directed    What is the reason for referral?: Other Comment - GERD   Discharge instructions   Complete by: As directed    Small frequent meals   Increase activity slowly   Complete by: As directed       Allergies as of 10/29/2023   No Known Allergies      Medication List     STOP taking these medications    omeprazole 20 MG capsule Commonly known as: PRILOSEC       TAKE these medications    acetaminophen  500 MG tablet Commonly known as: TYLENOL  Take 1 tablet (500 mg total) by mouth every 6 (six) hours as needed. What changed:  how much to take when to take this   albuterol  108 (90 Base) MCG/ACT inhaler Commonly known as: VENTOLIN  HFA Inhale 2 puffs into the lungs 2 (two) times daily as needed for wheezing  or shortness of breath.   albuterol  (2.5 MG/3ML) 0.083% nebulizer solution Commonly known as: PROVENTIL  Take 3 mLs (2.5 mg total) by nebulization every 4 (four) hours as needed for wheezing or shortness of breath.   alum & mag hydroxide-simeth 200-200-20 MG/5ML suspension Commonly known as: MAALOX/MYLANTA Take 30 mLs by mouth every 4 (four) hours as needed for indigestion or heartburn.   amLODipine  5 MG tablet Commonly known as: NORVASC  Take 5 mg by mouth daily.   ascorbic acid 1000 MG tablet Commonly known as: VITAMIN C Take 1,000 mg by mouth daily.   aspirin  EC 81 MG tablet Take 81 mg by mouth daily. Swallow whole.   atorvastatin  20 MG tablet Commonly known as: LIPITOR Take 20 mg by mouth at bedtime.   B-12 PO Take 1 tablet by mouth daily.   Breo Ellipta  100-25 MCG/ACT Aepb Generic drug: fluticasone  furoate-vilanterol INHALE 1 PUFF BY MOUTH IN THE MORNING   busPIRone  10 MG tablet Commonly known as: BUSPAR  Take 10 mg by mouth 2 (two) times daily.   cetirizine 10 MG tablet Commonly known as: ZYRTEC Take 10 mg by mouth at bedtime.   cholecalciferol  25 MCG (1000 UNIT) tablet Commonly known as: VITAMIN D3 Take 1,000 Units by mouth daily.   clopidogrel  75 MG tablet Commonly known as: PLAVIX  Take 75 mg by mouth every morning.   ferrous sulfate  325 (65 FE) MG EC tablet Take 650 mg by mouth daily.   fluticasone  50 MCG/ACT nasal spray Commonly known as: FLONASE  Place 2 sprays into both nostrils daily. What changed:  when to take this reasons to take this   HYDROcodone -acetaminophen  5-325 MG tablet Commonly known as: NORCO/VICODIN Take 1-2 tablets by mouth every 4 (four) hours as needed for moderate pain (pain score 4-6).   isosorbide mononitrate 30 MG 24 hr tablet Commonly known as: IMDUR Take 0.5 tablets (15 mg total) by mouth daily. Start taking on: October 30, 2023   Jardiance 25 MG Tabs tablet Generic drug: empagliflozin Take 25 mg by mouth daily.    losartan  50 MG tablet Commonly known as: COZAAR  Take 50 mg by mouth every morning.   methocarbamol  500 MG tablet Commonly known as: ROBAXIN  Take 500 mg by mouth in the morning and at bedtime.   metoprolol  succinate 25 MG 24 hr tablet Commonly known  as: TOPROL -XL Take 25 mg by mouth daily.   nitroGLYCERIN  0.4 MG SL tablet Commonly known as: NITROSTAT  Place 0.4 mg under the tongue every 5 (five) minutes as needed for chest pain. What changed: Another medication with the same name was removed. Continue taking this medication, and follow the directions you see here.   OXYGEN  Inhale 4-7 L into the lungs continuous.   pantoprazole  40 MG tablet Commonly known as: PROTONIX  Take 1 tablet (40 mg total) by mouth 2 (two) times daily.   polyethylene glycol powder 17 GM/SCOOP powder Commonly known as: GLYCOLAX /MIRALAX  Take 119 g by mouth daily.   predniSONE  10 MG tablet Commonly known as: DELTASONE  TAKE 10MG  EVERY OTHER DAY, ALTERNATING WITH 5MG  What changed:  how much to take how to take this when to take this   REFRESH OP Place 1 drop into both eyes daily as needed (dry eyes).   sucralfate 1 GM/10ML suspension Commonly known as: CARAFATE Take 10 mLs (1 g total) by mouth 4 (four) times daily -  with meals and at bedtime.        Follow-up Information     Earvin Johnston PARAS, FNP Follow up in 1 week(s).   Specialty: Family Medicine Contact information: [address] [place] [address] [phone]                  Time coordinating discharge: 45 minutes  Signed:  Harlene RAYMOND Bowl DO  [facility] 10/29/2023, 1:41 PM

## 2023-10-29 NOTE — Progress Notes (Signed)
 PHARMACY - ANTICOAGULATION CONSULT NOTE  Pharmacy Consult for heparin  Indication: chest pain/ACS  No Known Allergies  Patient Measurements: Height: 5' 2 (157.5 cm) Weight: 72.4 kg (159 lb 9.8 oz) IBW/kg (Calculated) : 50.1 HEPARIN  DW (KG): 65.6 Heparin  dosing weight: 65 kg  Vital Signs: Temp: 98.5 F (36.9 C) (10/04 0457) Temp Source: Oral (10/04 0457) BP: 157/73 (10/04 0457) Pulse Rate: 85 (10/04 0457)  Labs: Recent Labs    10/28/23 1049 10/29/23 0403  HGB 11.1* 10.6*  HCT 38.1 36.5  PLT 185 168  HEPARINUNFRC  --  0.54  CREATININE 1.33* 1.29*    Estimated Creatinine Clearance: 34 mL/min (A) (by C-G formula based on SCr of 1.29 mg/dL (H)).  Medications: No anticoagulants PTA  Assessment: Pt is a 71 yoF presenting with chest pain. Cardiology consulted. Pharmacy consulted to dose heparin  for ACS.   10/29/2023 First heparin  level = 0.54 - therapeutic after 3900 unit bolus & drip at 750 units/hr CBC: Hgb 11.1> 10.6  Plt WNL Troponin: 50 > 82> 105> 113> 124> 132 No bleeding reported  Goal: Heparin  level 0.3 - 0.7  Plan: Continue Heparin  infusion at 750 units/hr Check 8 hour confirmatory heparin  level CBC, heparin  level daily Monitor for signs of bleeding  Rosaline IVAR Edison, Pharm.D Use secure chat for questions 10/29/2023 5:18 AM

## 2023-10-29 NOTE — Discharge Instructions (Addendum)
 OP PET scan and NM myocardial perfusion stress test next week or so

## 2023-10-29 NOTE — Progress Notes (Addendum)
 Discharge meds in a secure bag delivered to pt in room by this RN. ICM team needs to see pt prior to d/c. Pt dressed for d/c to home. W/C and O2 travel tanks in room. Pt uses [facility] for home O2

## 2023-10-29 NOTE — TOC Initial Note (Signed)
 Transition of Care [facility]) - Initial/Assessment Note    Patient Details  Name: Suzanne Dickson MRN: [id number] Date of Birth: March 07, 1945  Transition of Care [facility]) CM/SW Contact:    Sonda Manuella Quill, RN Phone Number: 10/29/2023, 3:09 PM  Clinical Narrative:                 [place] w/ pt and family in room; pt said she lives at home w/ her husband Liala Codispoti [phone]); she plans to return at d/c; he will provide transportation; pt verified insurance/PCP; she denied SDOH risks; pt has cane, Rollator, wheelchair, shower chair; home oxygen  from [employer]; she has full travel tank; no IP CM needs.   Expected Discharge Plan: Home/Self Care Barriers to Discharge: No Barriers Identified   Patient Goals and CMS Choice Patient states their goals for this hospitalization and ongoing recovery are:: home CMS Medicare.gov Compare Post Acute Care list provided to:: Patient        Expected Discharge Plan and Services   Discharge Planning Services: CM Consult Post Acute Care Choice: NA Living arrangements for the past 2 months: Single Family Home Expected Discharge Date: 10/29/23               DME Arranged: N/A DME Agency: NA       HH Arranged: NA HH Agency: NA        Prior Living Arrangements/Services Living arrangements for the past 2 months: Single Family Home Lives with:: Spouse Patient language and need for interpreter reviewed:: Yes Do you feel safe going back to the place where you live?: Yes      Need for Family Participation in Patient Care: Yes (Comment) Care giver support system in place?: Yes (comment) Current home services: DME (cane, Rollator, wheelchair, shower chair; home oxygen  from [employer]) Criminal Activity/Legal Involvement Pertinent to Current Situation/Hospitalization: No - Comment as needed  Activities of Daily Living   ADL Screening (condition at time of admission) Independently performs ADLs?: Yes (appropriate for developmental age) Is the  patient deaf or have difficulty hearing?: No Does the patient have difficulty seeing, even when wearing glasses/contacts?: No Does the patient have difficulty concentrating, remembering, or making decisions?: No  Permission Sought/Granted Permission sought to share information with : Case Manager Permission granted to share information with : Yes, Verbal Permission Granted  Share Information with NAME: Case Manager     Permission granted to share info w Relationship: Suzanne Dickson (spouse) [phone]     Emotional Assessment Appearance:: Appears stated age Attitude/Demeanor/Rapport: Gracious Affect (typically observed): Accepting Orientation: : Oriented to Self, Oriented to Place, Oriented to  Time, Oriented to Situation Alcohol / Substance Use: Not Applicable Psych Involvement: No (comment)  Admission diagnosis:  Chest wall pain [R07.89] Chest pain [R07.9] Patient Active Problem List   Diagnosis Date Noted   Chest pain 10/28/2023   CKD stage 3a, GFR 45-59 ml/min (HCC) 10/28/2023   CAP (community acquired pneumonia) 03/30/2023   URI (upper respiratory infection) 03/03/2023   Severe sepsis (HCC) 08/13/2022   Sepsis (HCC) 05/25/2022   Sepsis secondary to UTI (HCC) 05/24/2022   Pneumonia due to COVID-19 virus 09/16/2020   Hypokalemia 09/16/2020   Macrocytic anemia 09/16/2020   Abnormal EKG 09/16/2020   Abnormal CXR 09/16/2020   OSA on CPAP    CAD (coronary artery disease)    Anemia of chronic disease    Pulmonary arterial hypertension (HCC) 11/25/2018   COPD exacerbation (HCC) 11/24/2018   Leukocytosis 11/24/2018   Iron deficiency anemia due to chronic  blood loss 11/24/2018   Prolonged Q-T interval on ECG 11/24/2018   Unstable angina (HCC) 04/05/2017   Adenocarcinoma of left lung, 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   Solitary pulmonary nodule on lung CT 11/20/2015   Atherosclerosis of native arteries of extremity with intermittent claudication 10/23/2015   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 [address] [employer] [address] [place] [address] Phone: [phone] Fax: [phone]  [facility] [address] [place] [address] Phone: [phone] Fax: [phone]  [facility] - [facility] [address] [place] [address] Phone: [phone] Fax: [phone]     Social Drivers of Health (SDOH) Social History: SDOH Screenings   Food Insecurity: No Food Insecurity (10/29/2023)  Housing: Low Risk  (10/29/2023)  Transportation Needs: No Transportation Needs (10/29/2023)  Utilities: Not At Risk (10/29/2023)  Depression (PHQ2-9): Low Risk  (07/09/2022)  Financial Resource Strain: Not at Risk (04/13/2022)   Received from [facility]  Physical Activity: Not on File (02/04/2022)   [facility] from [facility]  Social Connections: Moderately Integrated (10/28/2023)  Stress: Not on File (02/04/2022)   Received from [facility]  Tobacco Use: Medium Risk (10/28/2023)   SDOH Interventions: Food Insecurity Interventions: Intervention Not Indicated, Inpatient TOC Housing Interventions: Intervention Not Indicated, Inpatient TOC Transportation Interventions: Intervention Not Indicated, Inpatient TOC Utilities Interventions: Intervention Not Indicated, Inpatient TOC   Readmission Risk Interventions     No data to display

## 2023-10-29 NOTE — Care Management Obs Status (Signed)
 MEDICARE OBSERVATION STATUS NOTIFICATION   Patient Details  Name: Suzanne Dickson MRN: [id number] Date of Birth: Jul 12, 1945   Medicare Observation Status Notification Given:  Yes    Sonda Manuella Quill, RN 10/29/2023, 3:04 PM

## 2023-10-29 NOTE — Progress Notes (Signed)
 Pt troponin levels have been elevated. I suspect that the level is being drawn on the same side that the heparin  is currently infusing. I will talk to the lab to make sure they are not drawing the level from the same side. The patient is in no acute distress and is not experiencing chest pain.

## 2023-10-29 NOTE — Consult Note (Signed)
 Ref: Suzanne Johnston PARAS, FNP   Subjective:  No chest pain. Heartburn is improving. Mild respiratory distress continues with 5 L of oxygen  by nasal cannula. VS stable. Patient prefers OP nuclear stress test. Echocardiogram shows normal LV systolic function. Minimally elevated troponin I appears to be from demand ischemia. Lipid panel was normal about 3 months ago.  Objective:  Vital Signs in the last 24 hours: Temp:  [97.4 F (36.3 C)-98.5 F (36.9 C)] 98.5 F (36.9 C) (10/04 0457) Pulse Rate:  [64-110] 85 (10/04 0457) Cardiac Rhythm: Normal sinus rhythm (10/04 0830) Resp:  [16-20] 16 (10/04 0457) BP: (96-163)/(63-100) 157/73 (10/04 0457) SpO2:  [95 %-100 %] 100 % (10/04 0457) Weight:  [71.6 kg-72.4 kg] 72.4 kg (10/03 1854)  Physical Exam: BP Readings from Last 1 Encounters:  10/29/23 (!) 157/73     Wt Readings from Last 1 Encounters:  10/28/23 72.4 kg    Weight change:  Body mass index is 29.19 kg/m. HEENT: [place]/AT, Eyes-Brown, Conjunctiva-Pale pink, Sclera-Non-icteric Neck: No JVD, No bruit, Trachea midline. Lungs:  Clearing, Bilateral. Cardiac:  Regular rhythm, normal S1 and S2, no S3. II/VI systolic murmur. Abdomen:  Soft, non-tender. BS present. Extremities:  No edema present. No cyanosis. No clubbing. CNS: AxOx3, Cranial nerves grossly intact, moves all 4 extremities.  Skin: Warm and dry.   Intake/Output from previous day: 10/03 0701 - 10/04 0700 In: 111.8 [I.V.:111.8] Out: -     Lab Results: BMET    Component Value Date/Time   NA 143 10/29/2023 0403   NA 139 10/28/2023 1049   NA 144 10/10/2023 0850   NA 141 08/13/2016 1120   NA 139 04/15/2016 1443   K 4.2 10/29/2023 0403   K 4.9 10/28/2023 1049   K 3.7 10/10/2023 0850   K 3.4 (L) 08/13/2016 1120   K 3.6 04/15/2016 1443   CL 102 10/29/2023 0403   CL 99 10/28/2023 1049   CL 107 10/10/2023 0850   CO2 29 10/29/2023 0403   CO2 26 10/28/2023 1049   CO2 32 10/10/2023 0850   CO2 25 08/13/2016 1120   CO2  24 04/15/2016 1443   GLUCOSE 98 10/29/2023 0403   GLUCOSE 169 (H) 10/28/2023 1049   GLUCOSE 134 (H) 10/10/2023 0850   GLUCOSE 123 08/13/2016 1120   GLUCOSE 85 04/15/2016 1443   BUN 32 (H) 10/29/2023 0403   BUN 34 (H) 10/28/2023 1049   BUN 22 10/10/2023 0850   BUN 15.8 08/13/2016 1120   BUN 18.5 04/15/2016 1443   CREATININE 1.29 (H) 10/29/2023 0403   CREATININE 1.33 (H) 10/28/2023 1049   CREATININE 1.14 (H) 10/10/2023 0850   CREATININE 1.15 (H) 04/15/2023 1004   CREATININE 1.40 (H) 10/21/2022 1014   CREATININE 1.1 08/13/2016 1120   CREATININE 1.2 (H) 04/15/2016 1443   CALCIUM  9.8 10/29/2023 0403   CALCIUM  10.3 10/28/2023 1049   CALCIUM  9.6 10/10/2023 0850   CALCIUM  9.1 08/13/2016 1120   CALCIUM  9.7 04/15/2016 1443   GFRNONAA 43 (L) 10/29/2023 0403   GFRNONAA 41 (L) 10/28/2023 1049   GFRNONAA 50 (L) 10/10/2023 0850   GFRNONAA 49 (L) 04/15/2023 1004   GFRNONAA 39 (L) 10/21/2022 1014   GFRAA 53 (L) 02/26/2019 1106   GFRAA >60 12/26/2018 0540   GFRAA 57 (L) 12/25/2018 0404   GFRAA >60 12/24/2018 0559   GFRAA 43 (L) 08/25/2018 1315   GFRAA >60 02/20/2018 1513   CBC    Component Value Date/Time   WBC 9.9 10/29/2023 0403   RBC 3.35 (  L) 10/29/2023 0403   HGB 10.6 (L) 10/29/2023 0403   HGB 10.8 (L) 10/10/2023 0850   HGB 9.0 (L) 08/13/2016 1120   HCT 36.5 10/29/2023 0403   HCT 30.1 (L) 08/13/2016 1120   PLT 168 10/29/2023 0403   PLT 130 (L) 10/10/2023 0850   PLT 175 08/13/2016 1120   MCV 109.0 (H) 10/29/2023 0403   MCV 79.1 (L) 08/13/2016 1120   MCH 31.6 10/29/2023 0403   MCHC 29.0 (L) 10/29/2023 0403   RDW 11.9 10/29/2023 0403   RDW 15.4 (H) 08/13/2016 1120   LYMPHSABS 0.9 10/28/2023 1049   LYMPHSABS 1.4 08/13/2016 1120   MONOABS 0.8 10/28/2023 1049   MONOABS 0.5 08/13/2016 1120   EOSABS 0.0 10/28/2023 1049   EOSABS 0.2 08/13/2016 1120   BASOSABS 0.0 10/28/2023 1049   BASOSABS 0.0 08/13/2016 1120   HEPATIC Function Panel Recent Labs    04/15/23 1004  10/10/23 0850 10/28/23 1049  PROT 7.2 7.2 8.1  ALBUMIN 4.5 4.5 4.9  AST 15 14* 25  ALT 17 13 18   ALKPHOS 44 43 60   HEMOGLOBIN A1C Lab Results  Component Value Date   MPG 134.11 10/28/2023   CARDIAC ENZYMES Lab Results  Component Value Date   TROPONINI <0.03 02/17/2017   BNP Recent Labs    10/28/23 1103  PROBNP 220.0   TSH No results for input(s): TSH in the last 8760 hours. CHOLESTEROL No results for input(s): CHOL in the last 8760 hours.  Scheduled Meds:  amLODipine   5 mg Oral Daily   aspirin  EC  81 mg Oral Daily   atorvastatin   20 mg Oral QHS   busPIRone   10 mg Oral BID   clopidogrel   75 mg Oral q morning   empagliflozin  25 mg Oral Daily   ferrous sulfate   650 mg Oral Q breakfast   fluticasone  furoate-vilanterol  1 puff Inhalation Daily   insulin  aspart  0-9 Units Subcutaneous TID WC   isosorbide mononitrate  15 mg Oral Daily   loratadine   10 mg Oral Daily   losartan   50 mg Oral q morning   methocarbamol   500 mg Oral BID   metoprolol  succinate  25 mg Oral Daily   pantoprazole   40 mg Oral BID   polyethylene glycol  17 g Oral Daily   predniSONE   10 mg Oral QODAY   [START ON 10/30/2023] predniSONE   5 mg Oral QODAY   sucralfate  1 g Oral TID WC & HS   Continuous Infusions: PRN Meds:.acetaminophen  **OR** acetaminophen , albuterol , alum & mag hydroxide-simeth, HYDROcodone -acetaminophen , mouth rinse  Assessment/Plan: Acute coronary syndrome CAD Abnormal troponin I from demand ischemia GERD S/P RCA stenting in 2019 HTN Type 2 DM COPD OSA S/P adenocarcinoma of lung DJD Anxiety and depression  Plan: DC IV heparin . Long acting nitroglycerin . Ambulation as tolerated. OP PET scan and NM myocardial perfusion stress test next week or so. F/U Dr. Levern in 1 week.    LOS: 0 days   Time spent including chart review, lab review, examination, discussion with patient/Family/Nurse/Doctor : 30 min   Salena Negri  MD  10/29/2023, 10:28 AM

## 2023-10-29 NOTE — Progress Notes (Signed)
 AVS reviewed w/ pt who verbalized an understanding. PIV removed by primary  nurse. Pt dressed for d/c to home- to lobby via her own w/c w/ travel O2 tank on 5 L [place]

## 2023-10-31 LAB — GLUCOSE, CAPILLARY: Glucose-Capillary: 189 mg/dL — ABNORMAL HIGH (ref 70–99)

## 2023-11-01 ENCOUNTER — Encounter (HOSPITAL_COMMUNITY)
Admission: RE | Admit: 2023-11-01 | Discharge: 2023-11-01 | Disposition: A | Discharge status: Home / Self Care | Source: Ambulatory Visit | Attending: Internal Medicine | Admitting: Internal Medicine

## 2023-11-01 DIAGNOSIS — C3490 Malignant neoplasm of unspecified part of unspecified bronchus or lung: Secondary | ICD-10-CM | POA: Diagnosis not present

## 2023-11-01 LAB — GLUCOSE, CAPILLARY: Glucose-Capillary: 170 mg/dL — ABNORMAL HIGH (ref 70–99)

## 2023-11-01 MED ORDER — FLUDEOXYGLUCOSE F - 18 (FDG) INJECTION
7.9000 | Freq: Once | INTRAVENOUS | Status: AC
  Administered 2023-11-01: 7.9 via INTRAVENOUS

## 2023-11-07 DIAGNOSIS — E119 Type 2 diabetes mellitus without complications: Secondary | ICD-10-CM | POA: Diagnosis not present

## 2023-11-07 DIAGNOSIS — I272 Pulmonary hypertension, unspecified: Secondary | ICD-10-CM | POA: Diagnosis not present

## 2023-11-07 DIAGNOSIS — I10 Essential (primary) hypertension: Secondary | ICD-10-CM | POA: Diagnosis not present

## 2023-11-07 DIAGNOSIS — I2510 Atherosclerotic heart disease of native coronary artery without angina pectoris: Secondary | ICD-10-CM | POA: Diagnosis not present

## 2023-11-09 DIAGNOSIS — Z09 Encounter for follow-up examination after completed treatment for conditions other than malignant neoplasm: Secondary | ICD-10-CM | POA: Diagnosis not present

## 2023-11-09 DIAGNOSIS — R0789 Other chest pain: Secondary | ICD-10-CM | POA: Diagnosis not present

## 2023-11-09 DIAGNOSIS — E0590 Thyrotoxicosis, unspecified without thyrotoxic crisis or storm: Secondary | ICD-10-CM | POA: Diagnosis not present

## 2023-11-09 DIAGNOSIS — D72829 Elevated white blood cell count, unspecified: Secondary | ICD-10-CM | POA: Diagnosis not present

## 2023-11-09 DIAGNOSIS — F419 Anxiety disorder, unspecified: Secondary | ICD-10-CM | POA: Diagnosis not present

## 2023-11-09 DIAGNOSIS — J441 Chronic obstructive pulmonary disease with (acute) exacerbation: Secondary | ICD-10-CM | POA: Diagnosis not present

## 2023-11-09 DIAGNOSIS — K219 Gastro-esophageal reflux disease without esophagitis: Secondary | ICD-10-CM | POA: Diagnosis not present

## 2023-11-09 DIAGNOSIS — I10 Essential (primary) hypertension: Secondary | ICD-10-CM | POA: Diagnosis not present

## 2023-11-09 DIAGNOSIS — N179 Acute kidney failure, unspecified: Secondary | ICD-10-CM | POA: Diagnosis not present

## 2023-11-09 DIAGNOSIS — E119 Type 2 diabetes mellitus without complications: Secondary | ICD-10-CM | POA: Diagnosis not present

## 2023-11-15 ENCOUNTER — Inpatient Hospital Stay: Attending: Internal Medicine | Admitting: Internal Medicine

## 2023-11-15 VITALS — BP 144/64 | HR 83 | Temp 97.7°F | Resp 17 | Ht 62.0 in | Wt 158.0 lb

## 2023-11-15 DIAGNOSIS — R197 Diarrhea, unspecified: Secondary | ICD-10-CM | POA: Insufficient documentation

## 2023-11-15 DIAGNOSIS — C3412 Malignant neoplasm of upper lobe, left bronchus or lung: Secondary | ICD-10-CM | POA: Diagnosis not present

## 2023-11-15 DIAGNOSIS — M4856XA Collapsed vertebra, not elsewhere classified, lumbar region, initial encounter for fracture: Secondary | ICD-10-CM | POA: Insufficient documentation

## 2023-11-15 DIAGNOSIS — Z79899 Other long term (current) drug therapy: Secondary | ICD-10-CM | POA: Diagnosis not present

## 2023-11-15 DIAGNOSIS — K219 Gastro-esophageal reflux disease without esophagitis: Secondary | ICD-10-CM | POA: Insufficient documentation

## 2023-11-15 DIAGNOSIS — Z9089 Acquired absence of other organs: Secondary | ICD-10-CM | POA: Insufficient documentation

## 2023-11-15 DIAGNOSIS — Z9981 Dependence on supplemental oxygen: Secondary | ICD-10-CM | POA: Diagnosis not present

## 2023-11-15 DIAGNOSIS — I700 Atherosclerosis of aorta: Secondary | ICD-10-CM | POA: Diagnosis not present

## 2023-11-15 DIAGNOSIS — M479 Spondylosis, unspecified: Secondary | ICD-10-CM | POA: Diagnosis not present

## 2023-11-15 DIAGNOSIS — Z923 Personal history of irradiation: Secondary | ICD-10-CM | POA: Insufficient documentation

## 2023-11-15 DIAGNOSIS — C3490 Malignant neoplasm of unspecified part of unspecified bronchus or lung: Secondary | ICD-10-CM | POA: Diagnosis not present

## 2023-11-15 DIAGNOSIS — Z7982 Long term (current) use of aspirin: Secondary | ICD-10-CM | POA: Diagnosis not present

## 2023-11-15 DIAGNOSIS — Z9049 Acquired absence of other specified parts of digestive tract: Secondary | ICD-10-CM | POA: Insufficient documentation

## 2023-11-15 DIAGNOSIS — Z8701 Personal history of pneumonia (recurrent): Secondary | ICD-10-CM | POA: Insufficient documentation

## 2023-11-15 DIAGNOSIS — I110 Hypertensive heart disease with heart failure: Secondary | ICD-10-CM | POA: Insufficient documentation

## 2023-11-15 DIAGNOSIS — R079 Chest pain, unspecified: Secondary | ICD-10-CM | POA: Diagnosis not present

## 2023-11-15 DIAGNOSIS — J449 Chronic obstructive pulmonary disease, unspecified: Secondary | ICD-10-CM | POA: Insufficient documentation

## 2023-11-15 NOTE — Progress Notes (Signed)
 [facility] [phone]   Fax:([phone]) [phone]  OFFICE PROGRESS NOTE  Suzanne Johnston PARAS, FNP [address] [place] [address]  DIAGNOSIS: Stage IA (T1a, N0, M0) non-small cell lung cancer, adenocarcinoma presented with left upper lobe lung nodule diagnosed in March 2018.  PRIOR THERAPY:  1) S/P RT to the left lung nodule under the care of Dr. Shannon completed on 05/04/2016. 2) status post curative radiotherapy to the posterior left upper lobe lung nodule under the care of Dr. Shannon completed on August 17, 2022  CURRENT THERAPY: Observation.  INTERVAL HISTORY: Suzanne Dickson 78 y.o. female returns to the clinic today for follow-up visit accompanied by her husband. Discussed the use of AI scribe software for clinical note transcription with the patient, who gave verbal consent to proceed.  History of Present Illness Suzanne Dickson is a 78 year old female with lung cancer who presents for follow-up of a left lung obstruction.  She has a history of lung cancer and underwent curative radiotherapy to a posterior left upper lobe nodule in July 2024. She is currently in an observation period. A previous CT scan of the chest revealed progressive narrowing of the left lung lingular segment. A PET scan was scheduled for October 28, 2023, but was not completed due to the development of chest pain, which was later attributed to acid reflux. She was hospitalized overnight for this chest pain.  She underwent a second PET scan the following week, although the results are not available in the records. The initial PET scan was nondiagnostic due to the incomplete procedure. She denies current chest pain, hemoptysis, or significant respiratory symptoms, although she mentions being short of breath and is on oxygen  therapy.  She experiences diarrhea, which she attributes to taking Melantra, and states that her nerves are 'bad'. She is currently taking medication for acid  reflux, which has been helpful. No nausea or vomiting.    MEDICAL HISTORY: Past Medical History:  Diagnosis Date   Adenocarcinoma of left lung, 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   I'm in remission (04/05/2017)   Adenocarcinoma, lung (HCC)    Anxiety    Arthritis    legs, elbows (04/05/2017)   Bronchitis, chronic (HCC)    haven't had it in awhile (04/05/2017)   CHF (congestive heart failure) (HCC)    COPD (chronic obstructive pulmonary disease) (HCC)    Coronary artery disease    Daily headache    recently (04/05/2017)   Depression    Dyspnea    with exertion   GERD (gastroesophageal reflux disease)    History of blood transfusion 1950s   History of radiation therapy 05/04/16 - 05/12/16   Left lung treated to 54 Gy with 3 fx of 18 Gy   History of radiation therapy    Left Lung- 08/02/22-08/17/22- Dr. Lynwood Shannon   Hypertension    On home oxygen  therapy    2L; 24/7 (04/05/2017)   OSA (obstructive sleep apnea)    OSA on CPAP    Pneumonia 10/2015   at least 3 times (04/05/2017)   Pulmonary hypertension (HCC)     ALLERGIES:  has no known allergies.  MEDICATIONS:  Current Outpatient Medications  Medication Sig Dispense Refill   acetaminophen  (TYLENOL ) 500 MG tablet Take 1 tablet (500 mg total) by mouth every 6 (six) hours as needed. (Patient taking differently: Take 1,000 mg by mouth in the morning and at bedtime.) 30 tablet 0   albuterol  (PROVENTIL ) (2.5  MG/3ML) 0.083% nebulizer solution Take 3 mLs (2.5 mg total) by nebulization every 4 (four) hours as needed for wheezing or shortness of breath. 60 mL 11   albuterol  (VENTOLIN  HFA) 108 (90 Base) MCG/ACT inhaler Inhale 2 puffs into the lungs 2 (two) times daily as needed for wheezing or shortness of breath. 17 each 2   alum & mag hydroxide-simeth (MAALOX/MYLANTA) 200-200-20 MG/5ML suspension Take 30 mLs by mouth every 4 (four) hours as needed for indigestion or heartburn.     amLODipine  (NORVASC ) 5 MG tablet Take 5 mg by mouth  daily.     ascorbic acid (VITAMIN C) 1000 MG tablet Take 1,000 mg by mouth daily.     aspirin  EC 81 MG tablet Take 81 mg by mouth daily. Swallow whole.     atorvastatin  (LIPITOR) 20 MG tablet Take 20 mg by mouth at bedtime.     BREO ELLIPTA  100-25 MCG/ACT AEPB INHALE 1 PUFF BY MOUTH IN THE MORNING 60 each 3   busPIRone  (BUSPAR ) 10 MG tablet Take 10 mg by mouth 2 (two) times daily.     cetirizine (ZYRTEC) 10 MG tablet Take 10 mg by mouth at bedtime.     cholecalciferol  (VITAMIN D3) 25 MCG (1000 UNIT) tablet Take 1,000 Units by mouth daily.     clopidogrel  (PLAVIX ) 75 MG tablet Take 75 mg by mouth every morning.      Cyanocobalamin (B-12 PO) Take 1 tablet by mouth daily.     ferrous sulfate  325 (65 FE) MG EC tablet Take 650 mg by mouth daily.     fluticasone  (FLONASE ) 50 MCG/ACT nasal spray Place 2 sprays into both nostrils daily. (Patient taking differently: Place 2 sprays into both nostrils daily as needed for allergies or rhinitis.) 18.2 mL 2   HYDROcodone -acetaminophen  (NORCO/VICODIN) 5-325 MG tablet Take 1-2 tablets by mouth every 4 (four) hours as needed for moderate pain (pain score 4-6). 10 tablet 0   isosorbide mononitrate (IMDUR) 30 MG 24 hr tablet Take 0.5 tablets (15 mg total) by mouth daily. 30 tablet 0   JARDIANCE 25 MG TABS tablet Take 25 mg by mouth daily.     losartan  (COZAAR ) 50 MG tablet Take 50 mg by mouth every morning.     methocarbamol  (ROBAXIN ) 500 MG tablet Take 500 mg by mouth in the morning and at bedtime.     metoprolol  succinate (TOPROL -XL) 25 MG 24 hr tablet Take 25 mg by mouth daily.     nitroGLYCERIN  (NITROSTAT ) 0.4 MG SL tablet Place 0.4 mg under the tongue every 5 (five) minutes as needed for chest pain.     OXYGEN  Inhale 4-7 L into the lungs continuous.     pantoprazole  (PROTONIX ) 40 MG tablet Take 1 tablet (40 mg total) by mouth 2 (two) times daily. 60 tablet 0   polyethylene glycol powder (GLYCOLAX /MIRALAX ) 17 GM/SCOOP powder Take 119 g by mouth daily.      Polyvinyl Alcohol-Povidone (REFRESH OP) Place 1 drop into both eyes daily as needed (dry eyes).     predniSONE  (DELTASONE ) 10 MG tablet TAKE 10MG  EVERY OTHER DAY, ALTERNATING WITH 5MG  (Patient taking differently: Take 5-10 mg by mouth See admin instructions. TAKE 10MG  EVERY OTHER DAY, ALTERNATING WITH 5MG ) 90 tablet 0   sucralfate (CARAFATE) 1 GM/10ML suspension Take 10 mLs (1 g total) by mouth 4 (four) times daily -  with meals and at bedtime. 473 mL 0   No current facility-administered medications for this visit.    SURGICAL HISTORY:  Past Surgical  History:  Procedure Laterality Date   COLONOSCOPY     CORONARY ANGIOPLASTY WITH STENT PLACEMENT  04/05/2017   CORONARY STENT INTERVENTION N/A 04/05/2017   Procedure: CORONARY STENT INTERVENTION;  Surgeon: Levern Hutching, MD;  Location: [facility] INVASIVE [facility];  Service: Cardiovascular;  Laterality: N/A;   DILATION AND CURETTAGE OF UTERUS     FUDUCIAL PLACEMENT Left 03/31/2016   Procedure: PLACEMENT OF FUDUCIAL LEFT UPPER LOBE;  Surgeon: Lamar GORMAN Chris, MD;  Location: [facility] OR;  Service: Thoracic;  Laterality: Left;   LAPAROSCOPIC CHOLECYSTECTOMY     LEFT HEART CATH AND CORONARY ANGIOGRAPHY N/A 02/17/2017   Procedure: LEFT HEART CATH AND CORONARY ANGIOGRAPHY;  Surgeon: Levern Hutching, MD;  Location: [facility];  Service: Cardiovascular;  Laterality: N/A;   SINUSOTOMY     TONSILLECTOMY     TUBAL LIGATION     VIDEO BRONCHOSCOPY WITH ENDOBRONCHIAL NAVIGATION N/A 03/31/2016   Procedure: VIDEO BRONCHOSCOPY WITH ENDOBRONCHIAL NAVIGATION;  Surgeon: Lamar GORMAN Chris, MD;  Location: [facility] OR;  Service: Thoracic;  Laterality: N/A;    REVIEW OF SYSTEMS:  Constitutional: positive for fatigue Eyes: negative Ears, nose, mouth, throat, and face: negative Respiratory: positive for dyspnea on exertion Cardiovascular: negative Gastrointestinal: positive for reflux symptoms Genitourinary:negative Integument/breast: negative Hematologic/lymphatic:  negative Musculoskeletal:negative Neurological: negative Behavioral/Psych: negative Endocrine: negative Allergic/Immunologic: negative   PHYSICAL EXAMINATION: General appearance: alert, cooperative, fatigued, and no distress Head: Normocephalic, without obvious abnormality, atraumatic Neck: no adenopathy, no JVD, supple, symmetrical, trachea midline, and thyroid not enlarged, symmetric, no tenderness/mass/nodules Lymph nodes: Cervical, supraclavicular, and axillary nodes normal. Resp: clear to auscultation bilaterally Back: symmetric, no curvature. ROM normal. No CVA tenderness. Cardio: regular rate and rhythm, S1, S2 normal, no murmur, click, rub or gallop GI: soft, non-tender; bowel sounds normal; no masses,  no organomegaly Extremities: extremities normal, atraumatic, no cyanosis or edema Neurologic: Alert and oriented X 3, normal strength and tone. Normal symmetric reflexes. Normal coordination and gait  ECOG PERFORMANCE STATUS: 1 - Symptomatic but completely ambulatory  Blood pressure (!) 144/64, pulse 83, temperature 97.7 F (36.5 C), resp. rate 17, height 5' 2 (1.575 m), weight 158 lb (71.7 kg), SpO2 96%.  LABORATORY DATA: Lab Results  Component Value Date   WBC 9.9 10/29/2023   HGB 10.6 (L) 10/29/2023   HCT 36.5 10/29/2023   MCV 109.0 (H) 10/29/2023   PLT 168 10/29/2023      Chemistry      Component Value Date/Time   NA 143 10/29/2023 0403   NA 141 08/13/2016 1120   K 4.2 10/29/2023 0403   K 3.4 (L) 08/13/2016 1120   CL 102 10/29/2023 0403   CO2 29 10/29/2023 0403   CO2 25 08/13/2016 1120   BUN 32 (H) 10/29/2023 0403   BUN 15.8 08/13/2016 1120   CREATININE 1.29 (H) 10/29/2023 0403   CREATININE 1.14 (H) 10/10/2023 0850   CREATININE 1.1 08/13/2016 1120      Component Value Date/Time   CALCIUM  9.8 10/29/2023 0403   CALCIUM  9.1 08/13/2016 1120   ALKPHOS 60 10/28/2023 1049   ALKPHOS 68 08/13/2016 1120   AST 25 10/28/2023 1049   AST 14 (L) 10/10/2023 0850    AST 20 08/13/2016 1120   ALT 18 10/28/2023 1049   ALT 13 10/10/2023 0850   ALT 14 08/13/2016 1120   BILITOT 0.5 10/28/2023 1049   BILITOT 0.5 10/10/2023 0850   BILITOT 0.47 08/13/2016 1120       RADIOGRAPHIC STUDIES: ECHOCARDIOGRAM COMPLETE Result Date: 10/29/2023  ECHOCARDIOGRAM REPORT   Patient Name:   SHAKEIRA RHEE Date of Exam: 10/29/2023 Medical Rec #:  [id number]       Height:       62.0 in Accession #:    [id number]      Weight:       159.6 lb Date of Birth:  Jun 30, 1945      BSA:          1.737 m Patient Age:    77 years        BP:           157/73 mmHg Patient Gender: F               HR:           90 bpm. Exam Location:  Inpatient Procedure: 2D Echo, Cardiac Doppler and Color Doppler (Both Spectral and Color            Flow Doppler were utilized during procedure). Indications:     Chest Pain  History:         Patient has prior history of Echocardiogram examinations, most                  recent 08/13/2022. CAD, COPD and PAD; Risk Factors:Hypertension                  and Sleep Apnea. CKD.  Sonographer:     Philomena Daring Referring Phys:  [id number] ISAIAH GERALDS Diagnosing Phys: Salena Negri MD IMPRESSIONS  1. Left ventricular ejection fraction, by estimation, is 55 to 60%. The left ventricle has normal function. The left ventricle has no regional wall motion abnormalities. Left ventricular diastolic parameters are consistent with Grade I diastolic dysfunction (impaired relaxation).  2. Right ventricular systolic function is normal. The right ventricular size is normal.  3. Right atrial size was mildly dilated.  4. The mitral valve is degenerative. Trivial mitral valve regurgitation.  5. The aortic valve is tricuspid. There is mild calcification of the aortic valve. Aortic valve regurgitation is mild. Aortic valve sclerosis is present, with no evidence of aortic valve stenosis.  6. The inferior vena cava is normal in size with greater than 50% respiratory variability, suggesting right atrial  pressure of 3 mmHg. FINDINGS  Left Ventricle: Left ventricular ejection fraction, by estimation, is 55 to 60%. The left ventricle has normal function. The left ventricle has no regional wall motion abnormalities. The left ventricular internal cavity size was normal in size. There is  borderline concentric left ventricular hypertrophy. Left ventricular diastolic parameters are consistent with Grade I diastolic dysfunction (impaired relaxation). Right Ventricle: The right ventricular size is normal. No increase in right ventricular wall thickness. Right ventricular systolic function is normal. Left Atrium: Left atrial size was normal in size. Right Atrium: Right atrial size was mildly dilated. Pericardium: There is no evidence of pericardial effusion. Mitral Valve: The mitral valve is degenerative in appearance. Trivial mitral valve regurgitation. Tricuspid Valve: The tricuspid valve is normal in structure. Tricuspid valve regurgitation is mild. Aortic Valve: The aortic valve is tricuspid. There is mild calcification of the aortic valve. There is mild aortic valve annular calcification. Aortic valve regurgitation is mild. Aortic regurgitation PHT measures 434 msec. Aortic valve sclerosis is present, with no evidence of aortic valve stenosis. Pulmonic Valve: The pulmonic valve was normal in structure. Pulmonic valve regurgitation is not visualized. Aorta: The aortic root is normal in size and structure. There is minimal (Grade I) atheroma plaque involving the aortic  root. Venous: The inferior vena cava is normal in size with greater than 50% respiratory variability, suggesting right atrial pressure of 3 mmHg. IAS/Shunts: The atrial septum is grossly normal.  LEFT VENTRICLE PLAX 2D LVIDd:         3.90 cm   Diastology LVIDs:         2.70 cm   LV e' medial:    5.00 cm/s LV PW:         1.10 cm   LV E/e' medial:  8.6 LV IVS:        1.00 cm   LV e' lateral:   7.94 cm/s LVOT diam:     2.00 cm   LV E/e' lateral: 5.4 LV SV:          56 LV SV Index:   32 LVOT Area:     3.14 cm  RIGHT VENTRICLE             IVC RV Basal diam:  3.30 cm     IVC diam: 1.70 cm RV Mid diam:    2.40 cm RV S prime:     11.40 cm/s TAPSE (M-mode): 1.8 cm LEFT ATRIUM           Index        RIGHT ATRIUM           Index LA diam:      3.10 cm 1.78 cm/m   RA Area:     13.40 cm LA Vol (A2C): 32.0 ml 18.42 ml/m  RA Volume:   32.10 ml  18.48 ml/m LA Vol (A4C): 37.0 ml 21.30 ml/m  AORTIC VALVE LVOT Vmax:   101.00 cm/s LVOT Vmean:  64.500 cm/s LVOT VTI:    0.177 m AI PHT:      434 msec  AORTA Ao Root diam: 3.10 cm Ao Asc diam:  3.00 cm MITRAL VALVE               TRICUSPID VALVE MV Area (PHT): 3.46 cm    TR Peak grad:   49.6 mmHg MV Decel Time: 219 msec    TR Vmax:        352.00 cm/s MV E velocity: 43.20 cm/s MV A velocity: 80.40 cm/s  SHUNTS MV E/A ratio:  0.54        Systemic VTI:  0.18 m                            Systemic Diam: 2.00 cm Salena Negri MD Electronically signed by Salena Negri MD Signature Date/Time: 10/29/2023/9:57:41 AM    Final    NM PET Image Restage (PS) Skull Base to Thigh (F-18 FDG) Result Date: 10/28/2023 CLINICAL DATA:  Subsequent treatment strategy for non-small cell lung cancer. EXAM: LIMITED NUCLEAR MEDICINE PET SKULL BASE TO THIGH TECHNIQUE: 7.8 mCi F-18 FDG was injected intravenously. Full-ring PET imaging was performed from the thighs through the mid pelvis after the radiotracer. PET image acquisition was not completed due to the patient developing chest pain during the study. The examination was terminated, and the patient was sent to the emergency department. CT data was obtained and used for attenuation correction and anatomic localization. Fasting blood glucose: 178 mg/dl COMPARISON:  PET-CT 95/74/7975.  Chest CTA 10/10/2023. FINDINGS: As above, this study was not completed and is extremely limited. On the limited PET acquisition images through the pelvis and thighs, no abnormal uptake is identified. CT images demonstrate  atherosclerosis of the aorta,  great vessels and coronary arteries and mild centrilobular emphysema. There are chronic treatment changes with volume loss and scarring in the left perihilar region, similar to recent chest CT. Unchanged nodularity along the superior aspect of the left major fissure and underlying fiducial markers. Incidental findings in the abdomen are grossly stable, including postsurgical changes from previous cholecystectomy, bilateral renal cysts, nonobstructing renal calculi and/or renovascular calcifications, sigmoid colon diverticulosis and diffuse aortic and branch vessel atherosclerosis. There is multilevel spondylosis with a grossly stable chronic superior endplate compression deformity at L4. IMPRESSION: 1. Nondiagnostic PET-CT. Imaging was terminated prior to acquisition of the PET data due to the patient developing chest pain. Examination can be repeated at a later date. 2. The findings on the acquired CT data are unchanged from prior imaging, as detailed. Electronically Signed   By: Elsie Perone M.D.   On: 10/28/2023 15:06   CT Angio Chest PE W and/or Wo Contrast Result Date: 10/28/2023 CLINICAL DATA:  Chest pain.  History of lung cancer. EXAM: CT ANGIOGRAPHY CHEST WITH CONTRAST TECHNIQUE: Multidetector CT imaging of the chest was performed using the standard protocol during bolus administration of intravenous contrast. Multiplanar CT image reconstructions and MIPs were obtained to evaluate the vascular anatomy. RADIATION DOSE REDUCTION: This exam was performed according to the departmental dose-optimization program which includes automated exposure control, adjustment of the mA and/or kV according to patient size and/or use of iterative reconstruction technique. CONTRAST:  60mL OMNIPAQUE  IOHEXOL  350 MG/ML SOLN COMPARISON:  October 10, 2023. FINDINGS: Cardiovascular: Satisfactory opacification of the pulmonary arteries to the segmental level. No evidence of pulmonary embolism.  Mild cardiomegaly. Coronary artery calcifications are noted. Atherosclerosis of thoracic aorta is noted without aneurysm formation. No pericardial effusion. Mediastinum/Nodes: No enlarged mediastinal, hilar, or axillary lymph nodes. Thyroid gland, trachea, and esophagus demonstrate no significant findings. Lungs/Pleura: No pneumothorax or pleural effusion is noted. Mild emphysematous disease is noted. Probable scarring is noted anteriorly in right upper lobe. Stable left perihilar and lingular opacity is noted which may represent either atelectasis or postoperative scarring or fibrosis. Underlying malignancy cannot be excluded. Fiducial marker is noted. Stable 11 x 6 mm nodule is noted posteriorly in left upper lobe adjacent to major fissure seen on image number 54 series 12. Upper Abdomen: No acute abnormality. Musculoskeletal: No acute osseous abnormality is noted. Review of the MIP images confirms the above findings. IMPRESSION: 1. No definite evidence of pulmonary embolus. 2. Stable left perihilar and lingular opacity is noted which may represent atelectasis or postoperative scarring or fibrosis. Underlying malignancy cannot be excluded. 3. Stable 11 x 6 mm nodule is noted posteriorly in left upper lobe adjacent to major fissure. 4. Coronary artery calcifications are noted. Aortic Atherosclerosis (ICD10-I70.0) and Emphysema (ICD10-J43.9). Electronically Signed   By: Lynwood Landy Raddle M.D.   On: 10/28/2023 14:22   DG Chest Port 1 View Result Date: 10/28/2023 CLINICAL DATA:  Pain. EXAM: PORTABLE CHEST 1 VIEW COMPARISON:  03/30/2023 FINDINGS: The cardio pericardial silhouette is enlarged. Increased parahilar opacity in the left lung may be treatment related given the fiducial markers in the region of the left hilum left findings appear progressive since 03/30/2023. The cardiopericardial silhouette is within normal limits for size. Hazy opacity over the lung bases likely superimposition of soft tissues. No  substantial pleural effusion. No pulmonary edema. IMPRESSION: Increased parahilar opacity in the left lung may be treatment related given the fiducial markers in the region of the left hilum. Correlation with same day PET-CT report recommended. Electronically Signed  By: Camellia Candle M.D.   On: 10/28/2023 11:07     ASSESSMENT AND PLAN: This is a very pleasant 78 years old white female with a stage IA non-small cell lung cancer, adenocarcinoma presented with left upper lobe lung nodule. The patient is status post stereotactic radiotherapy to the left upper lobe lung nodule and she tolerated the procedure well. She is also status post SBRT to recurrent posterior left upper lobe lung nodule under the care of Dr. Shannon completed in July 2024 The patient is currently on observation and she continues to have shortness of breath at baseline increased with exertion and currently on home oxygen . She had a PET scan performed recently but this was not completed secondary to development of chest pain during the procedure. It was not diagnostic. Assessment and Plan Assessment & Plan Left lung nodule post-radiotherapy under surveillance Post-radiotherapy surveillance for left lung nodule. No significant changes on recent imaging. - Continue surveillance with CT scan in three months.  Progressive narrowing of left lung lingular segment (possible central obstructive lesion) Progressive narrowing of the left lung lingular segment concerning for a central obstructive lesion. PET scan was nondiagnostic due to incomplete imaging from chest pain. CT portion showed no significant changes. - Continue surveillance with CT scan in three months.  Gastroesophageal reflux disease Recent episode of chest pain attributed to acid reflux. Managed with Melantra, which has been effective. - Continue current management with Melantra.  Diarrhea likely medication-related Diarrhea likely related to medication use.  Chronic  shortness of breath on supplemental oxygen  Chronic shortness of breath managed with supplemental oxygen . No new symptoms reported. - Continue supplemental oxygen  therapy. She was advised to call immediately if she has any other concerning symptoms in the interval.  The patient voices understanding of current disease status and treatment options and is in agreement with the current care plan. Al she was advised to call immediately if she has any concerning symptoms in the interval.  l questions were answered. The patient knows to call the clinic with any problems, questions or concerns. We can certainly see the patient much sooner if necessary.  Disclaimer: This note was dictated with voice recognition software. Similar sounding words can inadvertently be transcribed and may not be corrected upon review.

## 2023-11-17 ENCOUNTER — Telehealth: Payer: Self-pay | Admitting: Internal Medicine

## 2023-11-17 NOTE — Telephone Encounter (Signed)
 Scheduled appointments with the patient per los.

## 2023-11-19 DIAGNOSIS — I10 Essential (primary) hypertension: Secondary | ICD-10-CM | POA: Diagnosis not present

## 2023-11-23 DIAGNOSIS — M545 Low back pain, unspecified: Secondary | ICD-10-CM | POA: Diagnosis not present

## 2023-11-23 DIAGNOSIS — F4323 Adjustment disorder with mixed anxiety and depressed mood: Secondary | ICD-10-CM | POA: Diagnosis not present

## 2023-11-23 DIAGNOSIS — N179 Acute kidney failure, unspecified: Secondary | ICD-10-CM | POA: Diagnosis not present

## 2023-11-23 DIAGNOSIS — D72829 Elevated white blood cell count, unspecified: Secondary | ICD-10-CM | POA: Diagnosis not present

## 2023-11-23 DIAGNOSIS — K219 Gastro-esophageal reflux disease without esophagitis: Secondary | ICD-10-CM | POA: Diagnosis not present

## 2023-11-23 DIAGNOSIS — G8929 Other chronic pain: Secondary | ICD-10-CM | POA: Diagnosis not present

## 2023-11-23 DIAGNOSIS — E875 Hyperkalemia: Secondary | ICD-10-CM | POA: Diagnosis not present

## 2023-12-20 DIAGNOSIS — J42 Unspecified chronic bronchitis: Secondary | ICD-10-CM | POA: Diagnosis not present

## 2023-12-20 DIAGNOSIS — I10 Essential (primary) hypertension: Secondary | ICD-10-CM | POA: Diagnosis not present

## 2023-12-26 DIAGNOSIS — D539 Nutritional anemia, unspecified: Secondary | ICD-10-CM | POA: Diagnosis not present

## 2023-12-26 DIAGNOSIS — I10 Essential (primary) hypertension: Secondary | ICD-10-CM | POA: Diagnosis not present

## 2023-12-26 DIAGNOSIS — K219 Gastro-esophageal reflux disease without esophagitis: Secondary | ICD-10-CM | POA: Diagnosis not present

## 2023-12-26 DIAGNOSIS — Z9981 Dependence on supplemental oxygen: Secondary | ICD-10-CM | POA: Diagnosis not present

## 2023-12-26 DIAGNOSIS — N179 Acute kidney failure, unspecified: Secondary | ICD-10-CM | POA: Diagnosis not present

## 2024-01-21 ENCOUNTER — Other Ambulatory Visit: Payer: Self-pay | Admitting: Internal Medicine

## 2024-01-24 ENCOUNTER — Telehealth: Payer: Self-pay | Admitting: Physician Assistant

## 2024-01-24 NOTE — Telephone Encounter (Signed)
 Called pt and spouse both did not answer

## 2024-01-30 ENCOUNTER — Ambulatory Visit: Admitting: Internal Medicine

## 2024-01-30 ENCOUNTER — Encounter: Payer: Self-pay | Admitting: Internal Medicine

## 2024-01-30 VITALS — BP 177/73 | HR 91 | Temp 97.8°F | Ht 62.0 in | Wt 154.0 lb

## 2024-01-30 DIAGNOSIS — J449 Chronic obstructive pulmonary disease, unspecified: Secondary | ICD-10-CM

## 2024-01-30 DIAGNOSIS — J9611 Chronic respiratory failure with hypoxia: Secondary | ICD-10-CM

## 2024-01-30 DIAGNOSIS — Z9981 Dependence on supplemental oxygen: Secondary | ICD-10-CM

## 2024-01-30 NOTE — Assessment & Plan Note (Addendum)
 Quit smoking 08/2014 - PFT's 12/09/14   FEV1 0.75 (44 % ) ratio 62  p no % improvement from saba p ? prior to study with DLCO  24 % corrects to 48 % for alv volume    - 12/09/2014 referred to rehab > started 12/2014  - 04/25/2015   try BEVESPI   - 06/20/2015  Changed by insurance> back on symb 160  - 09/22/2015 not happy with symbicort  > changed back to besvespi 2bid > not happy with bevespi  > changed back to symb 160 2bid and consider adding spiriva next vs trelegy trial   - 02/17/2018  After extensive coaching inhaler device,  effectiveness =    50% with hfa and 90% with elipta so rec changed to trelegy   - 05/08/2018 added pred x 6 days as plan D  - 01/15/2019 changed pred to 20 mg until better then 10 mg daily  - 03/29/2019  After extensive coaching inhaler device,  effectiveness =    50% with hfa  - 12/03/2019 changed to ceiling of 20 mg pred/ floor of 5 mg per day - 02/16/2021 chagned pred 20 mg ceiling and floor of 10 mg as flared on 5 consistently  -  11/26/21 rechallenged with ceiling of 20 mg and floor of 5 mg daily/ retrained on Elipta  -  09/28/2022 rec change trelegy to [facility] due to constipation > resolved   Well compensated but severe copd / prednisone  dep but not needing much saba a this point so no change rx antcipated.

## 2024-01-30 NOTE — Patient Instructions (Signed)
 No change in medications    Please schedule a follow up visit in 6  months but call sooner if needed

## 2024-01-30 NOTE — Assessment & Plan Note (Addendum)
 Started on 02 2lpm at d/c 09/04/14  - 10/28/2014   Walked RA x one lap @ 185 stopped due to  desat to 82%  at nl pace so rec 02 2lpm with walking and sleeping  - 12/09/2014 referred for POC > did not qualify - 08/17/2016 Patient Saturations on Room Air at Rest = 85%---increased to 93% on 2lpm o2 - 04/18/2017  Walking across exam room sats dropped into 80's on 2lpm  - 11/16/2017   Walked RA x one lap @ 185 stopped due to 87% and even on 5lpm could not maintain sats at nl pace  -  01/11/2018 humidfy 02  Added > not using as of 05/08/2018 > rec she do so > improved on humidity - 03/29/2019  Required up to 6lpm with activity to complete about 200 ft walking  - HCO3  12/26/23  = 28   Well compensated on 5-6 lpm 24/7   F/u can be q 6 m, sooner prn         Each maintenance medication was reviewed in detail including emphasizing most importantly the difference between maintenance and prns and under what circumstances the prns are to be triggered using an action plan format where appropriate.  Total time for H and P, chart review, counseling, reviewing hfa/ neb/ 02 / pulse ox  device(s) and generating customized AVS unique to this office visit / same day charting = 21 min

## 2024-01-30 NOTE — Progress Notes (Signed)
 "      Subjective:    Patient ID: Suzanne Dickson, female   DOB: [birth year]     MRN: [id number]    Brief patient profile:  62  yobf quit smoking on admit 09/02/14  and proved to have GOLD III copd 11/2014    History of Present Illness  09/22/2015  f/u ov/Suzanne Dickson re: copd III/ symb 1602bid on 2lpm at rest/ 4lpm ex  Chief Complaint  Patient presents with   Follow-up    [place] had hemoptysis and increased SOB. She took round pred and augmentin  and symptoms are some better. She has noticed minimal wheezing at night. She has used neb 3 x over the past 2 wks.   liked bevespi  better but wasn't covered then and not doing as well on the symbicort  though hfa not ideal  - see a/p rec Plan A = Automatic =stop symbicort  and restart Bevespi  Take 2 puffs first thing in am and then another 2 puffs about 12 hours later.  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   11/26/2021  f/u ov/Suzanne Dickson re: GOLD 2 copd/02 dep   maint on 10 mg /daily worse on 5mg   Chief Complaint  Patient presents with   Follow-up  Dyspnea:  across the room / sometimes sits outside garage Cough: none  Sleeping: 45 degrees wedge pillow  SABA use: uses hfa but neb one or two a day  02: 4 sleeping and up to  6lpm walking with sats mostly 90s  Rec Prednisone  10 mg alternating with 5 mg  Plan A = Automatic = Always=    Trelegy 100 each am  Plan B = Backup (to supplement plan A, not to replace it) Only use your albuterol  inhaler as a rescue  medication Plan C = Crisis (instead of Plan B but only if Plan B stops working) - only use your albuterol  nebulizer if you first try Plan B       09/28/2022  3 m f/u ov/Suzanne Dickson re: GOLD 2/ 02 dep/ OCS dep   maint on trelegy  10-29-08 prednisone   Chief Complaint  Patient presents with   Follow-up    SOB today.  Patient states she dose not feel well today.  Dyspnea:  room to room with a walker on 6lpm limited by back pain, not breathing  Cough: none  Sleeping: bed level with wedge s  resp cc s cpap  SABA use: every couple of days 02: 4lpm hs  up to 6lpm walkng  Severe constipation ? Trelegy related  Rec Plan A = Automatic = Always=    Breo 100 one click each am - alternative advair 250 one twice daily  Plan B = Backup (to supplement plan A, not to replace it) Only use your albuterol  inhaler as a rescue medication  Plan C = Crisis (instead of Plan B but only if Plan B stops working) - only use your  albuterol  nebulizer if you first try Plan B  Make sure you check your oxygen  saturation  AT  your highest level of activity (not after you stop)   to be sure it stays over 90%      09/29/2023  yearly f/u ov/Suzanne Dickson re: COPD GOLD 2 / 02 dep/ OCS dep  maint on pred 10-5- 10  Breo  Chief Complaint  Patient presents with   Shortness of Breath    Wheezing and PND and nasal congestion x 2 weeks   Dyspnea:  room to room with a walker  Cough: none  Sleeping: level bed/ wedge pillow resp cc  SABA use: rarely need neb  02: increased to 6 lp to get to dressed then back to 4lpm   Already on amox / pred 20 mg for nasal flare   since 09/27/23   Patient Instructions  No change in recommendations  Please schedule a follow up visit in 3 months but call sooner if needed to see NP  Add: consider stiolto 2.5 x one puff daily (minimize constipation risk) or ohtuvayre  next ov    01/30/2024  f/u ov/Suzanne Dickson re: COPD GOLD 2 / 02 dep/ OCS dep maint on total  10--5-10  prednisone   Chief Complaint  Patient presents with    COPD    Patient states acid reflux ix affecting breathing. Started last march, diagnosed october   Dyspnea:  room to room  Cough: none  Sleeping: level bed wedge pillow s  resp cc  SABA use: once a week/ nebulizer very rare  02: 5-6lpm      No obvious day to day or daytime variability or assoc excess/ purulent sputum or mucus plugs or hemoptysis or cp or chest tightness, subjective wheeze or overt sinus or hb symptoms.    Also denies any obvious fluctuation of symptoms with weather or environmental changes or other aggravating or alleviating factors except as outlined above   No unusual exposure hx or h/o childhood pna/ asthma or knowledge of premature birth.  Current Allergies, Complete Past Medical History, Past Surgical History, Family History, and Social History were reviewed in [employer] record.  ROS  The following are not active complaints unless bolded Hoarseness, sore throat, dysphagia, dental problems, itching, sneezing,  nasal congestion or discharge of excess mucus or purulent secretions, ear ache,   fever, chills, sweats, unintended wt loss or wt gain, classically pleuritic or exertional cp,  orthopnea pnd or arm/hand swelling  or leg swelling, presyncope, palpitations, abdominal pain, anorexia, nausea, vomiting, diarrhea  or change in bowel habits or change in bladder habits, change in stools or change in urine, dysuria, hematuria,  rash, arthralgias, visual complaints, headache, numbness, weakness or ataxia or problems with walking or coordination,  change in mood or  memory.          Outpatient Medications Prior to Visit  Medication Sig Dispense Refill   acetaminophen  (TYLENOL ) 500 MG tablet Take 1 tablet (500 mg total) by mouth every 6 (six) hours as needed. 30 tablet 0   albuterol  (PROVENTIL ) (2.5 MG/3ML) 0.083% nebulizer solution Take 3 mLs (2.5 mg total) by nebulization every 4 (four) hours as needed for wheezing or shortness of breath. 60 mL 11    albuterol  (VENTOLIN  HFA) 108 (90 Base) MCG/ACT inhaler Inhale 2 puffs into the lungs 2 (two) times daily as needed for wheezing or shortness of breath. 17 each 2   alum & mag hydroxide-simeth (MAALOX/MYLANTA) 200-200-20 MG/5ML suspension Take 30 mLs by mouth  every 4 (four) hours as needed for indigestion or heartburn.     amLODipine  (NORVASC ) 5 MG tablet Take 5 mg by mouth daily.     ascorbic acid (VITAMIN C) 1000 MG tablet Take 1,000 mg by mouth daily.     aspirin  EC 81 MG tablet Take 81 mg by mouth daily. Swallow whole.     atorvastatin  (LIPITOR) 20 MG tablet Take 20 mg by mouth at bedtime.     BREO ELLIPTA  100-25 MCG/ACT AEPB INHALE 1 PUFF BY MOUTH IN THE MORNING 60 each 3   busPIRone  (BUSPAR ) 10 MG tablet Take 10 mg by mouth 2 (two) times daily.     cetirizine (ZYRTEC) 10 MG tablet Take 10 mg by mouth at bedtime.     cholecalciferol  (VITAMIN D3) 25 MCG (1000 UNIT) tablet Take 1,000 Units by mouth daily.     clopidogrel  (PLAVIX ) 75 MG tablet Take 75 mg by mouth every morning.      Cyanocobalamin (B-12 PO) Take 1 tablet by mouth daily.     ferrous sulfate  325 (65 FE) MG EC tablet Take 650 mg by mouth daily.     fluticasone  (FLONASE ) 50 MCG/ACT nasal spray Place 2 sprays into both nostrils daily. 18.2 mL 2   HYDROcodone -acetaminophen  (NORCO/VICODIN) 5-325 MG tablet Take 1-2 tablets by mouth every 4 (four) hours as needed for moderate pain (pain score 4-6). 10 tablet 0   JARDIANCE  25 MG TABS tablet Take 25 mg by mouth daily.     losartan  (COZAAR ) 50 MG tablet Take 50 mg by mouth every morning.     methocarbamol  (ROBAXIN ) 500 MG tablet Take 500 mg by mouth in the morning and at bedtime.     metoprolol  succinate (TOPROL -XL) 25 MG 24 hr tablet Take 25 mg by mouth daily.     nitroGLYCERIN  (NITROSTAT ) 0.4 MG SL tablet Place 0.4 mg under the tongue every 5 (five) minutes as needed for chest pain.     OXYGEN  Inhale 4-7 L into the lungs continuous.     pantoprazole  (PROTONIX ) 40 MG tablet Take 1  tablet (40 mg total) by mouth 2 (two) times daily. 60 tablet 0   polyethylene glycol powder (GLYCOLAX /MIRALAX ) 17 GM/SCOOP powder Take 119 g by mouth daily.     Polyvinyl Alcohol-Povidone (REFRESH OP) Place 1 drop into both eyes daily as needed (dry eyes).     predniSONE  (DELTASONE ) 10 MG tablet TAKE 10MG  EVERY OTHER DAY, ALTERNATING WITH 5MG  90 tablet 0   isosorbide  mononitrate (IMDUR ) 30 MG 24 hr tablet Take 0.5 tablets (15 mg total) by mouth daily. (Patient not taking: Reported on 01/30/2024) 30 tablet 0   sucralfate  (CARAFATE ) 1 GM/10ML suspension Take 10 mLs (1 g total) by mouth 4 (four) times daily -  with meals and at bedtime. (Patient not taking: Reported on 01/30/2024) 473 mL 0   No facility-administered medications prior to visit.              Objective:  Physical Exam  Wts  01/30/2024  09/29/2023      158  09/28/2022      154  06/09/2022   160  05/25/2021     156 02/16/2021   158 06/02/2020     170  12/03/2019   162 03/29/2019     164  08/08/2018   154 10/28/2014        160 > 12/09/2014 161 > 03/14/2015 158 >    04/25/2015  161 >  06/20/2015  158 > 09/22/2015  159 > 11/19/2015 159 > 12/23/2015   159  > 02/03/2016    163  > 05/18/2016   161 > 11/17/2016   154 > 01/07/2017  157 > 04/18/2017   148 > 07/19/2017  145 > 10/19/2017    150  > 11/16/2017   150 >  01/11/2018   150> 02/17/2018  150     10/10/14 160 lb (72.576 kg)  09/16/14 156 lb (70.761 kg)  09/04/14 160 lb 13.6 oz (72.96 kg)     Vital signs reviewed  01/30/2024  - Note at rest 02 sats  94% on 5lpm    General appearance:    w/c bound chronically ill bf nad     HEENT : Oropharynx  clear   Nasal turbinates nl    NECK :  without  apparent JVD/ palpable Nodes/TM    LUNGS: no acc muscle use,  Mild barrel  contour chest wall with bilateral  Distant bs s audible wheeze and  without cough on insp or exp maneuvers  and mild  Hyperresonant  to  percussion bilaterally     CV:  RRR  no s3 or murmur or increase in P2, and no edema   ABD: obese    soft and nontender   MS:   ext warm without deformities Or obvious joint restrictions  calf tenderness, cyanosis or clubbing     SKIN: warm and dry without lesions    NEURO:  alert, approp, nl sensorium with  no motor or cerebellar deficits apparent.      Assessment:         Assessment & Plan COPD GOLD III/ 02 dep with chronic respiratory failure on 5L oxygen  [place] Quit smoking 08/2014 - PFT's 12/09/14   FEV1 0.75 (44 % ) ratio 62  p no % improvement from saba p ? prior to study with DLCO  24 % corrects to 48 % for alv volume    - 12/09/2014 referred to rehab > started 12/2014  - 04/25/2015   try BEVESPI   - 06/20/2015  Changed by insurance> back on symb 160  - 09/22/2015 not happy with symbicort  > changed back to besvespi 2bid > not happy with bevespi  > changed back to symb 160 2bid and consider adding spiriva next vs trelegy trial   - 02/17/2018  After extensive coaching inhaler device,  effectiveness =    50% with hfa and 90% with elipta so rec changed to trelegy   - 05/08/2018 added pred x 6 days as plan D  - 01/15/2019 changed pred to 20 mg until better then 10 mg daily  - 03/29/2019  After extensive coaching inhaler device,  effectiveness =    50% with hfa  - 12/03/2019 changed to ceiling of 20 mg pred/ floor of 5 mg per day - 02/16/2021 chagned pred 20 mg ceiling and floor of 10 mg as flared on 5 consistently  -  11/26/21 rechallenged with ceiling of 20 mg and floor of 5 mg daily/ retrained on Elipta  -  09/28/2022 rec change trelegy to [facility] due to constipation > resolved   Well compensated but severe copd / prednisone  dep but not needing much saba a this point so no change rx antcipated.    Chronic respiratory failure with hypoxia (HCC) Started on 02 2lpm at d/c 09/04/14  - 10/28/2014   Walked RA x one lap @ 185 stopped due to  desat to 82%  at nl pace so rec 02 2lpm with walking and sleeping  -  12/09/2014 referred for POC > did not qualify - 08/17/2016 Patient Saturations on Room Air at  Rest = 85%---increased to 93% on 2lpm o2 - 04/18/2017  Walking across exam room sats dropped into 80's on 2lpm  - 11/16/2017   Walked RA x one lap @ 185 stopped due to 87% and even on 5lpm could not maintain sats at nl pace  -  01/11/2018 humidfy 02  Added > not using as of 05/08/2018 > rec she do so > improved on humidity - 03/29/2019  Required up to 6lpm with activity to complete about 200 ft walking  - HCO3  12/26/23  = 28   Well compensated on 5-6 lpm 24/7   F/u can be q 6 m, sooner prn         Each maintenance medication was reviewed in detail including emphasizing most importantly the difference between maintenance and prns and under what circumstances the prns are to be triggered using an action plan format where appropriate.  Total time for H and P, chart review, counseling, reviewing hfa/ neb/ 02 / pulse ox  device(s) and generating customized AVS unique to this office visit / same day charting = 21 min          AVS  Patient Instructions  No change in medications    Please schedule a follow up visit in  6 months but call sooner if needed    Ozell America, MD 01/30/2024                        "

## 2024-02-07 ENCOUNTER — Inpatient Hospital Stay

## 2024-02-07 NOTE — Progress Notes (Signed)
 [facility] OFFICE PROGRESS NOTE  Earvin Johnston PARAS, FNP [address] [place] [address]  DIAGNOSIS: Stage IA (T1a, N0, M0) non-small cell lung cancer, adenocarcinoma presented with left upper lobe lung nodule diagnosed in March 2018.   PRIOR THERAPY: 1) S/P RT to the left lung nodule under the care of Dr. Shannon completed on 05/04/2016. 2) status post curative radiotherapy to the posterior left upper lobe lung nodule under the care of Dr. Shannon completed on August 17, 2022  CURRENT THERAPY: Observation   INTERVAL HISTORY: Suzanne Dickson 79 y.o. female returns to the clinic today for a follow-up visit accompanied by her husband.  The patient was last seen in clinic in October 2025 by Dr. Sherrod.  The patient has a history of lung cancer and underwent radiation to the posterior left upper lobe in July 2024 and she is currently on observation.  She previously had a CT scan of the chest that showed progressive narrowing of the left lower lung lingular segment.  She had a PET scan but the PET scan was terminated due to the patient developing chest pain and reflux.  She denies any major changes in her health since she was last seen. She states she has reflux today. She wears supplemental oxygen  with 5 L.  She recently expectorated a small amount of blood, which she attributes to epistaxis secondary to nasal dryness from oxygen  therapy; this is managed with saline spray and a humidifier.She denies any fever, chills, night sweats, or unexplained weight loss.  She denies any cough or hemoptysis. She denies changes with her baseline dyspnea on exertion.  Denies any nausea, vomiting, diarrhea, or constipation.  Denies any headache or visual changes.  He sees Dr. Darlean from pulmonary medicine and saw him recently on 01/30/24. She has osteoporosis and a history of a compound fracture in her back, which causes chronic back pain. She takes vitamin D  supplementation for bone health. She recently  had a restaging CT scan.  She is here today for evaluation and to review her scan results.    MEDICAL HISTORY: Past Medical History:  Diagnosis Date   Adenocarcinoma of left lung, 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   I'm in remission (04/05/2017)   Adenocarcinoma, lung (HCC)    Anxiety    Arthritis    legs, elbows (04/05/2017)   Bronchitis, chronic (HCC)    haven't had it in awhile (04/05/2017)   CHF (congestive heart failure) (HCC)    COPD (chronic obstructive pulmonary disease) (HCC)    Coronary artery disease    Daily headache    recently (04/05/2017)   Depression    Dyspnea    with exertion   GERD (gastroesophageal reflux disease)    History of blood transfusion 1950s   History of radiation therapy 05/04/16 - 05/12/16   Left lung treated to 54 Gy with 3 fx of 18 Gy   History of radiation therapy    Left Lung- 08/02/22-08/17/22- Dr. Lynwood Shannon   Hypertension    On home oxygen  therapy    2L; 24/7 (04/05/2017)   OSA (obstructive sleep apnea)    OSA on CPAP    Pneumonia 10/2015   at least 3 times (04/05/2017)   Pulmonary hypertension (HCC)     ALLERGIES:  has no known allergies.  MEDICATIONS:  Current Outpatient Medications  Medication Sig Dispense Refill   acetaminophen  (TYLENOL ) 500 MG tablet Take 1 tablet (500 mg total) by mouth every 6 (six) hours as needed. 30  tablet 0   albuterol  (PROVENTIL ) (2.5 MG/3ML) 0.083% nebulizer solution Take 3 mLs (2.5 mg total) by nebulization every 4 (four) hours as needed for wheezing or shortness of breath. 60 mL 11   albuterol  (VENTOLIN  HFA) 108 (90 Base) MCG/ACT inhaler Inhale 2 puffs into the lungs 2 (two) times daily as needed for wheezing or shortness of breath. 17 each 2   alum & mag hydroxide-simeth (MAALOX/MYLANTA) 200-200-20 MG/5ML suspension Take 30 mLs by mouth every 4 (four) hours as needed for indigestion or heartburn.     amLODipine  (NORVASC ) 5 MG tablet Take 5 mg by mouth daily.     ascorbic acid (VITAMIN C) 1000 MG  tablet Take 1,000 mg by mouth daily.     aspirin  EC 81 MG tablet Take 81 mg by mouth daily. Swallow whole.     atorvastatin  (LIPITOR) 20 MG tablet Take 20 mg by mouth at bedtime.     BREO ELLIPTA  100-25 MCG/ACT AEPB INHALE 1 PUFF BY MOUTH IN THE MORNING 60 each 3   busPIRone  (BUSPAR ) 10 MG tablet Take 10 mg by mouth 2 (two) times daily.     cetirizine (ZYRTEC) 10 MG tablet Take 10 mg by mouth at bedtime.     cholecalciferol  (VITAMIN D3) 25 MCG (1000 UNIT) tablet Take 1,000 Units by mouth daily.     clopidogrel  (PLAVIX ) 75 MG tablet Take 75 mg by mouth every morning.      Cyanocobalamin  (B-12 PO) Take 1 tablet by mouth daily.     ferrous sulfate  325 (65 FE) MG EC tablet Take 650 mg by mouth daily.     fluticasone  (FLONASE ) 50 MCG/ACT nasal spray Place 2 sprays into both nostrils daily. 18.2 mL 2   HYDROcodone -acetaminophen  (NORCO/VICODIN) 5-325 MG tablet Take 1-2 tablets by mouth every 4 (four) hours as needed for moderate pain (pain score 4-6). 10 tablet 0   JARDIANCE  25 MG TABS tablet Take 25 mg by mouth daily.     losartan  (COZAAR ) 50 MG tablet Take 50 mg by mouth every morning.     methocarbamol  (ROBAXIN ) 500 MG tablet Take 500 mg by mouth in the morning and at bedtime.     metoprolol  succinate (TOPROL -XL) 25 MG 24 hr tablet Take 25 mg by mouth daily.     nitroGLYCERIN  (NITROSTAT ) 0.4 MG SL tablet Place 0.4 mg under the tongue every 5 (five) minutes as needed for chest pain.     OXYGEN  Inhale 4-7 L into the lungs continuous.     pantoprazole  (PROTONIX ) 40 MG tablet Take 1 tablet (40 mg total) by mouth 2 (two) times daily. 60 tablet 0   polyethylene glycol powder (GLYCOLAX /MIRALAX ) 17 GM/SCOOP powder Take 119 g by mouth daily.     Polyvinyl Alcohol-Povidone (REFRESH OP) Place 1 drop into both eyes daily as needed (dry eyes).     predniSONE  (DELTASONE ) 10 MG tablet TAKE 10MG  EVERY OTHER DAY, ALTERNATING WITH 5MG  90 tablet 0   No current facility-administered medications for this visit.     SURGICAL HISTORY:  Past Surgical History:  Procedure Laterality Date   COLONOSCOPY     CORONARY ANGIOPLASTY WITH STENT PLACEMENT  04/05/2017   CORONARY STENT INTERVENTION N/A 04/05/2017   Procedure: CORONARY STENT INTERVENTION;  Surgeon: Levern Hutching, MD;  Location: [facility];  Service: Cardiovascular;  Laterality: N/A;   DILATION AND CURETTAGE OF UTERUS     FUDUCIAL PLACEMENT Left 03/31/2016   Procedure: PLACEMENT OF FUDUCIAL LEFT UPPER LOBE;  Surgeon: Lamar GORMAN Chris, MD;  Location: [facility] OR;  Service: Thoracic;  Laterality: Left;   LAPAROSCOPIC CHOLECYSTECTOMY     LEFT HEART CATH AND CORONARY ANGIOGRAPHY N/A 02/17/2017   Procedure: LEFT HEART CATH AND CORONARY ANGIOGRAPHY;  Surgeon: Levern Hutching, MD;  Location: [facility];  Service: Cardiovascular;  Laterality: N/A;   SINUSOTOMY     TONSILLECTOMY     TUBAL LIGATION     VIDEO BRONCHOSCOPY WITH ENDOBRONCHIAL NAVIGATION N/A 03/31/2016   Procedure: VIDEO BRONCHOSCOPY WITH ENDOBRONCHIAL NAVIGATION;  Surgeon: Lamar GORMAN Chris, MD;  Location: [facility] OR;  Service: Thoracic;  Laterality: N/A;    REVIEW OF SYSTEMS:   Review of Systems  Constitutional: Negative for appetite change, chills, fatigue, fever and unexpected weight change.  HENT: Negative for mouth sores, nosebleeds, sore throat and trouble swallowing.   Eyes: Negative for eye problems and icterus.  Respiratory: Positive for dyspnea on exertion. Negative for cough, hemoptysis,  and wheezing.   Cardiovascular: Negative for chest pain and leg swelling.  Gastrointestinal: Positive for reflux. Negative for abdominal pain, constipation, diarrhea, nausea and vomiting.  Genitourinary: Negative for bladder incontinence, difficulty urinating, dysuria, frequency and hematuria.   Musculoskeletal: Positive for chronic back pain. Negative for gait problem, neck pain and neck stiffness.  Skin: Negative for itching and rash.  Neurological: Negative for dizziness, extremity weakness,  gait problem, headaches, light-headedness and seizures.  Hematological: Negative for adenopathy. Does not bruise/bleed easily.  Psychiatric/Behavioral: Negative for confusion, depression and sleep disturbance. The patient is not nervous/anxious.     PHYSICAL EXAMINATION:  Blood pressure 138/69, pulse 94, temperature 98 F (36.7 C), temperature source Temporal, resp. rate 17, height 5' 2 (1.575 m), SpO2 97%.  ECOG PERFORMANCE STATUS: 2-3  Physical Exam  Constitutional: Oriented to person, place, and time and chronically ill appearing female, and in no distress.   HENT:  Head: Normocephalic and atraumatic.  Mouth/Throat: Oropharynx is clear and moist. No oropharyngeal exudate.  Eyes: Conjunctivae are normal. Right eye exhibits no discharge. Left eye exhibits no discharge. No scleral icterus.  Neck: Normal range of motion. Neck supple.  Cardiovascular: Normal rate, regular rhythm, normal heart sounds and intact distal pulses.   Pulmonary/Chest: Effort normal. Quiet breath sounds. No respiratory distress. No wheezes. No rales.  Abdominal: Soft. Bowel sounds are normal. Exhibits no distension and no mass. There is no tenderness.  Musculoskeletal: Normal range of motion. Exhibits no edema.  Lymphadenopathy:    No cervical adenopathy.  Neurological: Alert and oriented to person, place, and time. Exhibits muscle wasting. Examined in the wheelchair.  Skin: Skin is warm and dry. No rash noted. Not diaphoretic. No erythema. No pallor.  Psychiatric: Mood, memory and judgment normal.  Vitals reviewed.  LABORATORY DATA: Lab Results  Component Value Date   WBC 8.2 02/09/2024   HGB 10.8 (L) 02/09/2024   HCT 35.2 (L) 02/09/2024   MCV 105.1 (H) 02/09/2024   PLT 144 (L) 02/09/2024      Chemistry      Component Value Date/Time   NA 145 02/09/2024 1317   NA 141 08/13/2016 1120   K 4.1 02/09/2024 1317   K 3.4 (L) 08/13/2016 1120   CL 102 02/09/2024 1317   CO2 32 02/09/2024 1317   CO2 25  08/13/2016 1120   BUN 26 (H) 02/09/2024 1317   BUN 15.8 08/13/2016 1120   CREATININE 1.31 (H) 02/09/2024 1317   CREATININE 1.1 08/13/2016 1120      Component Value Date/Time   CALCIUM  9.9 02/09/2024 1317   CALCIUM  9.1  08/13/2016 1120   ALKPHOS 51 02/09/2024 1317   ALKPHOS 68 08/13/2016 1120   AST 24 02/09/2024 1317   AST 20 08/13/2016 1120   ALT 24 02/09/2024 1317   ALT 14 08/13/2016 1120   BILITOT 0.4 02/09/2024 1317   BILITOT 0.47 08/13/2016 1120       RADIOGRAPHIC STUDIES:  CT Chest Wo Contrast Result Date: 02/09/2024 EXAM: CT CHEST WITHOUT CONTRAST 02/09/2024 02:03:01 PM TECHNIQUE: CT of the chest was performed without the administration of intravenous contrast. Multiplanar reformatted images are provided for review. Automated exposure control, iterative reconstruction, and/or weight based adjustment of the mA/kV was utilized to reduce the radiation dose to as low as reasonably achievable. COMPARISON: 10/28/2023 CLINICAL HISTORY: Non-small cell lung cancer (NSCLC), staging. FINDINGS: MEDIASTINUM: Heart and pericardium are unremarkable. The central airways are clear. Thoracic aortic, coronary artery, and branch vessel atheromatous vascular calcifications. LYMPH NODES: No mediastinal, hilar or axillary lymphadenopathy. LUNGS AND PLEURA: Centrilobular emphysema. Similar pattern of left perihilar and lingular volume loss with adjacent fiducials along the left upper lobe hilum. Left upper lobe pleural based nodule along the major fissure 1.2 x 0.7 cm on image 53 series 7, formerly the same. Subsolid left upper lobe nodule along the major fissure 0.7 x 0.6 cm on image 41 series 7, increased conspicuity compared to prior stable pleural nodularity adjacent to a chronic irregular fracture with demineralization in the left postolateral 6th rib. Paracentral 0.7 x 0.5 cm right upper lobe pulmonary nodule on image 75 series 7, previous less conspicuous and previously 0.5 x 0.3 cm. 0.5 cm right lower  lobe nodule on image 122 series 2003, stable. 4 mm subsolid left upper lobe nodule on image 34 series 7, stable. No focal consolidation or pulmonary edema. No pleural effusion or pneumothorax. SOFT TISSUES/BONES: Chronic irregular fracture with demineralization in the left postolateral 6th rib. Chronic mild superior endplate compression fractures at T5 and T6 as well as T12 and L2. UPPER ABDOMEN: Limited images of the upper abdomen demonstrate cholecystectomy. Substantial abdominal aortic atherosclerosis including prominent atheromatous plaque at the origins of the celiac trunk and superior mesenteric artery. Fluid density right kidney upper pole cyst warrants no further imaging workup. Nonobstructive left nephrolithiasis. IMPRESSION: 1. Left upper lobe pleural-based nodule along the major fissure measuring 1.2 x 0.7 cm, stable, and additional stable pulmonary nodules including a 0.5 cm right lower lobe nodule and a 4 mm subsolid left upper lobe nodule. 2. Mildly increasing conspicuity of couple of small pulmonary nodules including the left upper lobe subsolid nodule on image 41 series 7, surveillance suggested. 3. Left perihilar and lingular volume loss with adjacent fiducials, similar to prior, compatible with posttreatment change. 4. Centrilobular emphysema. 5. Thoracic aortic, coronary artery, and branch vessel atheromatous vascular calcifications, with substantial abdominal aortic atherosclerosis including prominent atheromatous plaque at the origins of the celiac trunk and superior mesenteric artery. 6. Chronic irregular fracture with demineralization in the left posterolateral sixth rib. 7. Chronic mild superior endplate compression fractures at T5, T6, T12, and L2. 8. Nonobstructive left nephrolithiasis. 9. Cholecystectomy. Electronically signed by: Ryan Salvage MD 02/09/2024 02:42 PM EST RP Workstation: [id number]     ASSESSMENT/PLAN:  This is a very pleasant 79 year old African-American female  with history of stage Ia non-small cell lung cancer, adenocarcinoma.  She presented with left upper lobe lung nodule.   She is status post stereotactic radiotherapy to the left upper lobe nodule.  2018.  She had another left upper lobe lung lesion and completed SBRT under the  care of Dr. Shannon on 08/17/2022.   She is currently on observation.   The patient was seen with Dr. Sherrod today.  Dr. Sherrod personally and independently reviewed the scan and discussed results with the patient today.  The scan showed stable disease.  Dr. Sherrod recommends follow up CT in 4 months.  We will see her back for labs and a follow up visit in 4 months.   We will see her 1 week after the scan to review the results in the office.   - Continue surveillance with pulmonary medicine. - Encouraged use of humidifier and saline spray.  Gastroesophageal reflux disease Chronic GERD with nocturnal symptoms managed with lifestyle modifications and OTC medications. No acute complications or need for therapy escalation. - Reinforced dietary modifications and upright positioning after meals. - Advised continuation of current as-needed reflux medications. - Advised monitoring for worsening or new symptoms.  The patient was advised to call immediately if she has any concerning symptoms in the interval. The patient voices understanding of current disease status and treatment options and is in agreement with the current care plan. All questions were answered. The patient knows to call the clinic with any problems, questions or concerns. We can certainly see the patient much sooner if necessary      Orders Placed This Encounter  Procedures   CT Chest Wo Contrast    Standing Status:   Future    Expected Date:   06/06/2024    Expiration Date:   02/13/2025    Preferred imaging location?:   [facility]   CBC with Differential (Cancer Center Only)    Standing Status:   Future    Expected Date:   06/06/2024     Expiration Date:   02/13/2025   CMP (Cancer Center only)    Standing Status:   Future    Expected Date:   06/06/2024    Expiration Date:   02/13/2025      Nachmen Mansel L Mia Milan, PA-C 02/14/24  ADDENDUM: Hematology/Oncology Attending: I had a face-to-face encounter with the patient today.  I reviewed her record, lab, scan and recommended her care plan.  This is a very pleasant 79 years old African-American female with a stage Ia non-small cell lung cancer, adenocarcinoma diagnosed with left upper lobe lung nodule in March 2018 status post curative radiotherapy at that time then she also underwent curative radiotherapy to recurrent posterior left upper lobe lung nodule in July 2024 and has been in observation since that time.  She had repeat CT scan of the chest performed recently.  I personally independently reviewed the scan and discussed the result with the patient and her husband.  Her scan showed no concerning findings for disease progression.  She continues to have radiation changes and fibrosis from the previous radiotherapy. We will see the patient back for follow-up visit in 4 months for evaluation with repeat CT scan of the chest for restaging of her disease. She was advised to call immediately if she has any other concerning symptoms in the interval. Disclaimer: This note was dictated with voice recognition software. Similar sounding words can inadvertently be transcribed and may be missed upon review. Sherrod MARLA Sherrod, MD

## 2024-02-09 ENCOUNTER — Ambulatory Visit (HOSPITAL_COMMUNITY)
Admission: RE | Admit: 2024-02-09 | Discharge: 2024-02-09 | Disposition: A | Discharge status: Home / Self Care | Source: Ambulatory Visit | Attending: Internal Medicine | Admitting: Internal Medicine

## 2024-02-09 ENCOUNTER — Inpatient Hospital Stay: Attending: Internal Medicine

## 2024-02-09 DIAGNOSIS — C3490 Malignant neoplasm of unspecified part of unspecified bronchus or lung: Secondary | ICD-10-CM | POA: Insufficient documentation

## 2024-02-09 LAB — CMP (CANCER CENTER ONLY)
ALT: 24 U/L (ref 0–44)
AST: 24 U/L (ref 15–41)
Albumin: 4.6 g/dL (ref 3.5–5.0)
Alkaline Phosphatase: 51 U/L (ref 38–126)
Anion gap: 10 (ref 5–15)
BUN: 26 mg/dL — ABNORMAL HIGH (ref 8–23)
CO2: 32 mmol/L (ref 22–32)
Calcium: 9.9 mg/dL (ref 8.9–10.3)
Chloride: 102 mmol/L (ref 98–111)
Creatinine: 1.31 mg/dL — ABNORMAL HIGH (ref 0.44–1.00)
GFR, Estimated: 42 mL/min — ABNORMAL LOW
Glucose, Bld: 151 mg/dL — ABNORMAL HIGH (ref 70–99)
Potassium: 4.1 mmol/L (ref 3.5–5.1)
Sodium: 145 mmol/L (ref 135–145)
Total Bilirubin: 0.4 mg/dL (ref 0.0–1.2)
Total Protein: 7.6 g/dL (ref 6.5–8.1)

## 2024-02-09 LAB — CBC WITH DIFFERENTIAL (CANCER CENTER ONLY)
Abs Immature Granulocytes: 0.04 K/uL (ref 0.00–0.07)
Basophils Absolute: 0 K/uL (ref 0.0–0.1)
Basophils Relative: 0 %
Eosinophils Absolute: 0.1 K/uL (ref 0.0–0.5)
Eosinophils Relative: 2 %
HCT: 35.2 % — ABNORMAL LOW (ref 36.0–46.0)
Hemoglobin: 10.8 g/dL — ABNORMAL LOW (ref 12.0–15.0)
Immature Granulocytes: 1 %
Lymphocytes Relative: 14 %
Lymphs Abs: 1.1 K/uL (ref 0.7–4.0)
MCH: 32.2 pg (ref 26.0–34.0)
MCHC: 30.7 g/dL (ref 30.0–36.0)
MCV: 105.1 fL — ABNORMAL HIGH (ref 80.0–100.0)
Monocytes Absolute: 0.6 K/uL (ref 0.1–1.0)
Monocytes Relative: 7 %
Neutro Abs: 6.4 K/uL (ref 1.7–7.7)
Neutrophils Relative %: 76 %
Platelet Count: 144 K/uL — ABNORMAL LOW (ref 150–400)
RBC: 3.35 MIL/uL — ABNORMAL LOW (ref 3.87–5.11)
RDW: 11.9 % (ref 11.5–15.5)
WBC Count: 8.2 K/uL (ref 4.0–10.5)
nRBC: 0 % (ref 0.0–0.2)

## 2024-02-14 ENCOUNTER — Inpatient Hospital Stay: Admitting: Internal Medicine

## 2024-02-14 ENCOUNTER — Inpatient Hospital Stay: Admitting: Physician Assistant

## 2024-02-14 VITALS — BP 138/69 | HR 94 | Temp 98.0°F | Resp 17 | Ht 62.0 in

## 2024-02-14 DIAGNOSIS — C3490 Malignant neoplasm of unspecified part of unspecified bronchus or lung: Secondary | ICD-10-CM | POA: Diagnosis not present

## 2024-02-14 DIAGNOSIS — K219 Gastro-esophageal reflux disease without esophagitis: Secondary | ICD-10-CM | POA: Diagnosis not present

## 2024-02-14 DIAGNOSIS — C3492 Malignant neoplasm of unspecified part of left bronchus or lung: Secondary | ICD-10-CM

## 2024-02-16 ENCOUNTER — Telehealth: Payer: Self-pay | Admitting: Internal Medicine

## 2024-02-16 NOTE — Telephone Encounter (Signed)
 Tried calling the pt to sched her lab for her ct as well as her MD visit for the next week but no answer was unable to leave a vm because it was not set up yet

## 2024-06-06 ENCOUNTER — Inpatient Hospital Stay: Attending: Internal Medicine

## 2024-06-13 ENCOUNTER — Inpatient Hospital Stay: Admitting: Internal Medicine

## 2024-06-28 ENCOUNTER — Ambulatory Visit: Admitting: Internal Medicine
